# Patient Record
Sex: Male | Born: 1945 | Race: White | Hispanic: No | Marital: Married | Smoking: Former smoker
Health system: Southern US, Community
[De-identification: ages and names within clinical notes are randomized; demographics above are authoritative.]

## PROBLEM LIST (undated history)

## (undated) DIAGNOSIS — E119 Type 2 diabetes mellitus without complications: Secondary | ICD-10-CM

## (undated) DIAGNOSIS — I428 Other cardiomyopathies: Secondary | ICD-10-CM

## (undated) DIAGNOSIS — R51 Headache: Secondary | ICD-10-CM

## (undated) DIAGNOSIS — E785 Hyperlipidemia, unspecified: Secondary | ICD-10-CM

## (undated) DIAGNOSIS — I10 Essential (primary) hypertension: Secondary | ICD-10-CM

## (undated) DIAGNOSIS — C8590 Non-Hodgkin lymphoma, unspecified, unspecified site: Secondary | ICD-10-CM

## (undated) DIAGNOSIS — G4730 Sleep apnea, unspecified: Secondary | ICD-10-CM

## (undated) DIAGNOSIS — K5792 Diverticulitis of intestine, part unspecified, without perforation or abscess without bleeding: Secondary | ICD-10-CM

## (undated) DIAGNOSIS — I5022 Chronic systolic (congestive) heart failure: Secondary | ICD-10-CM

## (undated) HISTORY — DX: Chronic systolic (congestive) heart failure: I50.22

## (undated) HISTORY — DX: Diverticulitis of intestine, part unspecified, without perforation or abscess without bleeding: K57.92

## (undated) HISTORY — PX: HERNIA REPAIR: SHX51

## (undated) HISTORY — DX: Headache: R51

## (undated) HISTORY — DX: Other cardiomyopathies: I42.8

## (undated) HISTORY — DX: Sleep apnea, unspecified: G47.30

## (undated) HISTORY — PX: OTHER SURGICAL HISTORY: SHX169

## (undated) HISTORY — DX: Type 2 diabetes mellitus without complications: E11.9

## (undated) HISTORY — DX: Non-Hodgkin lymphoma, unspecified, unspecified site: C85.90

## (undated) HISTORY — DX: Essential (primary) hypertension: I10

---

## 2001-01-23 ENCOUNTER — Emergency Department (HOSPITAL_COMMUNITY): Admission: EM | Admit: 2001-01-23 | Discharge: 2001-01-23 | Payer: Self-pay | Admitting: *Deleted

## 2003-02-19 ENCOUNTER — Encounter (HOSPITAL_BASED_OUTPATIENT_CLINIC_OR_DEPARTMENT_OTHER): Payer: Self-pay | Admitting: General Surgery

## 2003-02-20 ENCOUNTER — Encounter (INDEPENDENT_AMBULATORY_CARE_PROVIDER_SITE_OTHER): Payer: Self-pay | Admitting: Specialist

## 2003-02-20 ENCOUNTER — Ambulatory Visit (HOSPITAL_COMMUNITY): Admission: RE | Admit: 2003-02-20 | Discharge: 2003-02-20 | Payer: Self-pay | Admitting: General Surgery

## 2003-08-30 ENCOUNTER — Emergency Department (HOSPITAL_COMMUNITY): Admission: EM | Admit: 2003-08-30 | Discharge: 2003-08-30 | Payer: Self-pay | Admitting: Emergency Medicine

## 2003-10-02 ENCOUNTER — Emergency Department (HOSPITAL_COMMUNITY): Admission: EM | Admit: 2003-10-02 | Discharge: 2003-10-02 | Payer: Self-pay | Admitting: Emergency Medicine

## 2003-11-25 ENCOUNTER — Encounter: Admission: RE | Admit: 2003-11-25 | Discharge: 2003-12-17 | Payer: Self-pay | Admitting: Occupational Medicine

## 2003-12-26 ENCOUNTER — Emergency Department (HOSPITAL_COMMUNITY): Admission: AD | Admit: 2003-12-26 | Discharge: 2003-12-26 | Payer: Self-pay | Admitting: Family Medicine

## 2005-02-28 ENCOUNTER — Ambulatory Visit: Payer: Self-pay | Admitting: Family Medicine

## 2005-03-04 ENCOUNTER — Ambulatory Visit: Payer: Self-pay | Admitting: Family Medicine

## 2005-03-08 ENCOUNTER — Ambulatory Visit: Payer: Self-pay | Admitting: Family Medicine

## 2005-11-15 ENCOUNTER — Ambulatory Visit: Payer: Self-pay | Admitting: Family Medicine

## 2005-12-15 ENCOUNTER — Ambulatory Visit: Payer: Self-pay | Admitting: Family Medicine

## 2006-01-09 ENCOUNTER — Ambulatory Visit: Payer: Self-pay | Admitting: Family Medicine

## 2006-03-01 ENCOUNTER — Ambulatory Visit: Payer: Self-pay | Admitting: Family Medicine

## 2006-07-14 ENCOUNTER — Ambulatory Visit: Payer: Self-pay | Admitting: Family Medicine

## 2006-07-24 ENCOUNTER — Ambulatory Visit: Payer: Self-pay | Admitting: Family Medicine

## 2006-11-03 ENCOUNTER — Ambulatory Visit: Payer: Self-pay | Admitting: Family Medicine

## 2007-01-10 ENCOUNTER — Ambulatory Visit: Payer: Self-pay | Admitting: Family Medicine

## 2007-06-07 DIAGNOSIS — R51 Headache: Secondary | ICD-10-CM | POA: Insufficient documentation

## 2007-06-07 DIAGNOSIS — R519 Headache, unspecified: Secondary | ICD-10-CM | POA: Insufficient documentation

## 2007-08-02 ENCOUNTER — Ambulatory Visit: Payer: Self-pay | Admitting: Family Medicine

## 2007-08-08 ENCOUNTER — Telehealth: Payer: Self-pay | Admitting: Family Medicine

## 2007-10-23 ENCOUNTER — Ambulatory Visit: Payer: Self-pay | Admitting: Family Medicine

## 2007-10-23 DIAGNOSIS — N200 Calculus of kidney: Secondary | ICD-10-CM | POA: Insufficient documentation

## 2007-10-23 DIAGNOSIS — I10 Essential (primary) hypertension: Secondary | ICD-10-CM | POA: Insufficient documentation

## 2007-10-23 LAB — CONVERTED CEMR LAB
Glucose, Urine, Semiquant: NEGATIVE
Specific Gravity, Urine: 1.02
WBC Urine, dipstick: NEGATIVE
pH: 5

## 2007-10-25 ENCOUNTER — Ambulatory Visit: Payer: Self-pay | Admitting: Internal Medicine

## 2007-10-26 ENCOUNTER — Ambulatory Visit: Payer: Self-pay | Admitting: Family Medicine

## 2007-10-26 DIAGNOSIS — R1900 Intra-abdominal and pelvic swelling, mass and lump, unspecified site: Secondary | ICD-10-CM | POA: Insufficient documentation

## 2007-10-26 LAB — CONVERTED CEMR LAB
ALT: 28 units/L (ref 0–53)
Albumin: 3.5 g/dL (ref 3.5–5.2)
Alkaline Phosphatase: 106 units/L (ref 39–117)
BUN: 12 mg/dL (ref 6–23)
Basophils Absolute: 0 10*3/uL (ref 0.0–0.1)
Basophils Relative: 0.2 % (ref 0.0–1.0)
CO2: 29 meq/L (ref 19–32)
Calcium: 9.3 mg/dL (ref 8.4–10.5)
GFR calc Af Amer: 110 mL/min
Lymphocytes Relative: 8 % — ABNORMAL LOW (ref 12.0–46.0)
MCHC: 33.6 g/dL (ref 30.0–36.0)
Monocytes Relative: 7.3 % (ref 3.0–11.0)
Neutro Abs: 7.9 10*3/uL — ABNORMAL HIGH (ref 1.4–7.7)
Platelets: 410 10*3/uL — ABNORMAL HIGH (ref 150–400)
RDW: 13.8 % (ref 11.5–14.6)
Total Protein: 7.2 g/dL (ref 6.0–8.3)

## 2007-10-29 ENCOUNTER — Ambulatory Visit: Payer: Self-pay | Admitting: Cardiology

## 2007-10-30 ENCOUNTER — Ambulatory Visit: Payer: Self-pay | Admitting: Family Medicine

## 2007-11-05 ENCOUNTER — Telehealth: Payer: Self-pay | Admitting: Family Medicine

## 2007-11-08 ENCOUNTER — Encounter (INDEPENDENT_AMBULATORY_CARE_PROVIDER_SITE_OTHER): Payer: Self-pay | Admitting: Diagnostic Radiology

## 2007-11-08 ENCOUNTER — Ambulatory Visit (HOSPITAL_COMMUNITY): Admission: RE | Admit: 2007-11-08 | Discharge: 2007-11-08 | Payer: Self-pay | Admitting: Family Medicine

## 2007-11-08 ENCOUNTER — Encounter: Payer: Self-pay | Admitting: Family Medicine

## 2007-11-14 ENCOUNTER — Telehealth: Payer: Self-pay | Admitting: Family Medicine

## 2007-11-20 ENCOUNTER — Ambulatory Visit: Payer: Self-pay | Admitting: Oncology

## 2007-11-20 ENCOUNTER — Telehealth: Payer: Self-pay | Admitting: Family Medicine

## 2007-11-21 ENCOUNTER — Telehealth: Payer: Self-pay | Admitting: Family Medicine

## 2007-11-26 ENCOUNTER — Ambulatory Visit: Payer: Self-pay | Admitting: Family Medicine

## 2007-11-27 ENCOUNTER — Encounter: Payer: Self-pay | Admitting: Family Medicine

## 2007-11-27 LAB — CBC WITH DIFFERENTIAL/PLATELET
BASO%: 0.4 % (ref 0.0–2.0)
EOS%: 1.7 % (ref 0.0–7.0)
HCT: 40 % (ref 38.7–49.9)
LYMPH%: 7.4 % — ABNORMAL LOW (ref 14.0–48.0)
MCH: 23.8 pg — ABNORMAL LOW (ref 28.0–33.4)
MCHC: 31.5 g/dL — ABNORMAL LOW (ref 32.0–35.9)
MONO%: 8.5 % (ref 0.0–13.0)
NEUT%: 82 % — ABNORMAL HIGH (ref 40.0–75.0)
Platelets: 434 10*3/uL — ABNORMAL HIGH (ref 145–400)

## 2007-11-27 LAB — COMPREHENSIVE METABOLIC PANEL
ALT: 15 U/L (ref 0–53)
AST: 15 U/L (ref 0–37)
Alkaline Phosphatase: 103 U/L (ref 39–117)
Creatinine, Ser: 0.94 mg/dL (ref 0.40–1.50)
Sodium: 139 mEq/L (ref 135–145)
Total Bilirubin: 0.6 mg/dL (ref 0.3–1.2)
Total Protein: 7.6 g/dL (ref 6.0–8.3)

## 2007-11-27 LAB — URIC ACID: Uric Acid, Serum: 7.3 mg/dL (ref 4.0–7.8)

## 2007-11-27 LAB — CANCER ANTIGEN 19-9: CA 19-9: 6.4 U/mL (ref <35.0)

## 2007-12-03 ENCOUNTER — Ambulatory Visit (HOSPITAL_COMMUNITY): Admission: RE | Admit: 2007-12-03 | Discharge: 2007-12-03 | Payer: Self-pay | Admitting: Oncology

## 2007-12-04 ENCOUNTER — Encounter: Payer: Self-pay | Admitting: Family Medicine

## 2007-12-04 ENCOUNTER — Ambulatory Visit (HOSPITAL_COMMUNITY): Admission: RE | Admit: 2007-12-04 | Discharge: 2007-12-04 | Payer: Self-pay | Admitting: Oncology

## 2007-12-04 ENCOUNTER — Encounter (INDEPENDENT_AMBULATORY_CARE_PROVIDER_SITE_OTHER): Payer: Self-pay | Admitting: Interventional Radiology

## 2007-12-05 ENCOUNTER — Encounter: Payer: Self-pay | Admitting: Family Medicine

## 2007-12-07 ENCOUNTER — Ambulatory Visit (HOSPITAL_COMMUNITY): Admission: RE | Admit: 2007-12-07 | Discharge: 2007-12-07 | Payer: Self-pay | Admitting: Oncology

## 2007-12-12 ENCOUNTER — Encounter (HOSPITAL_COMMUNITY): Payer: Self-pay | Admitting: Oncology

## 2007-12-12 ENCOUNTER — Other Ambulatory Visit: Admission: RE | Admit: 2007-12-12 | Discharge: 2007-12-12 | Payer: Self-pay | Admitting: Oncology

## 2007-12-12 ENCOUNTER — Encounter: Payer: Self-pay | Admitting: Family Medicine

## 2007-12-12 LAB — COMPREHENSIVE METABOLIC PANEL
ALT: 14 U/L (ref 0–53)
Albumin: 3.7 g/dL (ref 3.5–5.2)
CO2: 25 mEq/L (ref 19–32)
Calcium: 9.1 mg/dL (ref 8.4–10.5)
Chloride: 102 mEq/L (ref 96–112)
Creatinine, Ser: 0.86 mg/dL (ref 0.40–1.50)
Potassium: 4.4 mEq/L (ref 3.5–5.3)
Total Protein: 7.1 g/dL (ref 6.0–8.3)

## 2007-12-12 LAB — LACTATE DEHYDROGENASE: LDH: 343 U/L — ABNORMAL HIGH (ref 94–250)

## 2007-12-12 LAB — CBC WITH DIFFERENTIAL/PLATELET
BASO%: 0 % (ref 0.0–2.0)
Eosinophils Absolute: 0.2 10*3/uL (ref 0.0–0.5)
HCT: 36.4 % — ABNORMAL LOW (ref 38.7–49.9)
HGB: 12 g/dL — ABNORMAL LOW (ref 13.0–17.1)
MCHC: 33 g/dL (ref 32.0–35.9)
MONO#: 0.6 10*3/uL (ref 0.1–0.9)
NEUT#: 5.8 10*3/uL (ref 1.5–6.5)
NEUT%: 79.2 % — ABNORMAL HIGH (ref 40.0–75.0)
Platelets: 486 10*3/uL — ABNORMAL HIGH (ref 145–400)
WBC: 7.3 10*3/uL (ref 4.0–10.0)
lymph#: 0.7 10*3/uL — ABNORMAL LOW (ref 0.9–3.3)

## 2007-12-14 ENCOUNTER — Ambulatory Visit (HOSPITAL_COMMUNITY): Admission: RE | Admit: 2007-12-14 | Discharge: 2007-12-14 | Payer: Self-pay | Admitting: Oncology

## 2007-12-21 LAB — CBC WITH DIFFERENTIAL/PLATELET
BASO%: 0.3 % (ref 0.0–2.0)
Eosinophils Absolute: 0.2 10*3/uL (ref 0.0–0.5)
LYMPH%: 14.3 % (ref 14.0–48.0)
MCHC: 33.5 g/dL (ref 32.0–35.9)
MCV: 73.7 fL — ABNORMAL LOW (ref 81.6–98.0)
MONO%: 2.6 % (ref 0.0–13.0)
Platelets: 343 10*3/uL (ref 145–400)
RBC: 4.98 10*6/uL (ref 4.20–5.71)

## 2007-12-28 ENCOUNTER — Encounter: Payer: Self-pay | Admitting: Family Medicine

## 2007-12-28 LAB — COMPREHENSIVE METABOLIC PANEL
ALT: 43 U/L (ref 0–53)
AST: 25 U/L (ref 0–37)
Albumin: 4 g/dL (ref 3.5–5.2)
Alkaline Phosphatase: 116 U/L (ref 39–117)
BUN: 10 mg/dL (ref 6–23)
Potassium: 4.3 mEq/L (ref 3.5–5.3)
Sodium: 138 mEq/L (ref 135–145)

## 2007-12-28 LAB — CBC WITH DIFFERENTIAL/PLATELET
BASO%: 1.5 % (ref 0.0–2.0)
EOS%: 2.5 % (ref 0.0–7.0)
LYMPH%: 14.4 % (ref 14.0–48.0)
MCH: 24.8 pg — ABNORMAL LOW (ref 28.0–33.4)
MCHC: 33 g/dL (ref 32.0–35.9)
MONO#: 0.5 10*3/uL (ref 0.1–0.9)
Platelets: 180 10*3/uL (ref 145–400)
RBC: 5.19 10*6/uL (ref 4.20–5.71)
WBC: 5.7 10*3/uL (ref 4.0–10.0)

## 2008-01-04 LAB — CBC WITH DIFFERENTIAL/PLATELET
BASO%: 1.2 % (ref 0.0–2.0)
Eosinophils Absolute: 0 10*3/uL (ref 0.0–0.5)
HCT: 36.7 % — ABNORMAL LOW (ref 38.7–49.9)
LYMPH%: 16.2 % (ref 14.0–48.0)
MCHC: 34.4 g/dL (ref 32.0–35.9)
MONO#: 0.2 10*3/uL (ref 0.1–0.9)
NEUT%: 76.2 % — ABNORMAL HIGH (ref 40.0–75.0)
Platelets: 226 10*3/uL (ref 145–400)
WBC: 3.5 10*3/uL — ABNORMAL LOW (ref 4.0–10.0)

## 2008-01-09 ENCOUNTER — Ambulatory Visit: Payer: Self-pay | Admitting: Oncology

## 2008-01-10 ENCOUNTER — Encounter: Payer: Self-pay | Admitting: Family Medicine

## 2008-01-18 LAB — CBC WITH DIFFERENTIAL/PLATELET
BASO%: 0.2 % (ref 0.0–2.0)
Basophils Absolute: 0 10*3/uL (ref 0.0–0.1)
EOS%: 0.3 % (ref 0.0–7.0)
HGB: 11.6 g/dL — ABNORMAL LOW (ref 13.0–17.1)
MCH: 25.1 pg — ABNORMAL LOW (ref 28.0–33.4)
MONO%: 2.8 % (ref 0.0–13.0)
RBC: 4.61 10*6/uL (ref 4.20–5.71)
RDW: 18.5 % — ABNORMAL HIGH (ref 11.2–14.6)
lymph#: 0.3 10*3/uL — ABNORMAL LOW (ref 0.9–3.3)

## 2008-01-23 ENCOUNTER — Encounter (HOSPITAL_COMMUNITY): Payer: Self-pay | Admitting: Oncology

## 2008-01-23 ENCOUNTER — Ambulatory Visit: Admission: RE | Admit: 2008-01-23 | Discharge: 2008-01-23 | Payer: Self-pay | Admitting: Oncology

## 2008-01-23 ENCOUNTER — Ambulatory Visit: Payer: Self-pay | Admitting: Internal Medicine

## 2008-01-24 ENCOUNTER — Encounter: Payer: Self-pay | Admitting: Family Medicine

## 2008-01-24 LAB — CBC WITH DIFFERENTIAL/PLATELET
Basophils Absolute: 0 10*3/uL (ref 0.0–0.1)
Eosinophils Absolute: 0 10*3/uL (ref 0.0–0.5)
HGB: 11.4 g/dL — ABNORMAL LOW (ref 13.0–17.1)
MONO#: 0.8 10*3/uL (ref 0.1–0.9)
NEUT#: 6.1 10*3/uL (ref 1.5–6.5)
RBC: 4.53 10*6/uL (ref 4.20–5.71)
RDW: 20.2 % — ABNORMAL HIGH (ref 11.2–14.6)
WBC: 7.3 10*3/uL (ref 4.0–10.0)
lymph#: 0.4 10*3/uL — ABNORMAL LOW (ref 0.9–3.3)

## 2008-01-24 LAB — URIC ACID: Uric Acid, Serum: 4.5 mg/dL (ref 4.0–7.8)

## 2008-01-24 LAB — COMPREHENSIVE METABOLIC PANEL
Albumin: 3.6 g/dL (ref 3.5–5.2)
Alkaline Phosphatase: 97 U/L (ref 39–117)
BUN: 13 mg/dL (ref 6–23)
Calcium: 8.3 mg/dL — ABNORMAL LOW (ref 8.4–10.5)
Chloride: 101 mEq/L (ref 96–112)
Glucose, Bld: 112 mg/dL — ABNORMAL HIGH (ref 70–99)
Potassium: 4.3 mEq/L (ref 3.5–5.3)
Sodium: 137 mEq/L (ref 135–145)
Total Protein: 5.5 g/dL — ABNORMAL LOW (ref 6.0–8.3)

## 2008-01-24 LAB — LACTATE DEHYDROGENASE: LDH: 191 U/L (ref 94–250)

## 2008-02-01 LAB — CBC WITH DIFFERENTIAL/PLATELET
BASO%: 0.3 % (ref 0.0–2.0)
Basophils Absolute: 0 10*3/uL (ref 0.0–0.1)
HCT: 31.8 % — ABNORMAL LOW (ref 38.7–49.9)
HGB: 10.9 g/dL — ABNORMAL LOW (ref 13.0–17.1)
LYMPH%: 13.5 % — ABNORMAL LOW (ref 14.0–48.0)
MCH: 25.4 pg — ABNORMAL LOW (ref 28.0–33.4)
MCHC: 34.4 g/dL (ref 32.0–35.9)
MONO#: 0.2 10*3/uL (ref 0.1–0.9)
NEUT%: 73.7 % (ref 40.0–75.0)
Platelets: 235 10*3/uL (ref 145–400)
WBC: 1.8 10*3/uL — ABNORMAL LOW (ref 4.0–10.0)
lymph#: 0.2 10*3/uL — ABNORMAL LOW (ref 0.9–3.3)

## 2008-02-05 ENCOUNTER — Inpatient Hospital Stay (HOSPITAL_COMMUNITY): Admission: RE | Admit: 2008-02-05 | Discharge: 2008-02-07 | Payer: Self-pay | Admitting: Oncology

## 2008-02-05 ENCOUNTER — Ambulatory Visit: Payer: Self-pay | Admitting: Oncology

## 2008-02-05 LAB — CBC WITH DIFFERENTIAL/PLATELET
BASO%: 0.2 % (ref 0.0–2.0)
Basophils Absolute: 0 10*3/uL (ref 0.0–0.1)
EOS%: 0 % (ref 0.0–7.0)
HCT: 32 % — ABNORMAL LOW (ref 38.7–49.9)
HGB: 11 g/dL — ABNORMAL LOW (ref 13.0–17.1)
LYMPH%: 4 % — ABNORMAL LOW (ref 14.0–48.0)
MCH: 25.5 pg — ABNORMAL LOW (ref 28.0–33.4)
MCHC: 34.3 g/dL (ref 32.0–35.9)
MCV: 74.5 fL — ABNORMAL LOW (ref 81.6–98.0)
MONO%: 12 % (ref 0.0–13.0)
NEUT%: 83.8 % — ABNORMAL HIGH (ref 40.0–75.0)

## 2008-02-13 ENCOUNTER — Encounter: Payer: Self-pay | Admitting: Family Medicine

## 2008-02-13 LAB — COMPREHENSIVE METABOLIC PANEL
AST: 16 U/L (ref 0–37)
Albumin: 3.5 g/dL (ref 3.5–5.2)
Alkaline Phosphatase: 82 U/L (ref 39–117)
Potassium: 3.9 mEq/L (ref 3.5–5.3)
Sodium: 137 mEq/L (ref 135–145)
Total Protein: 5.8 g/dL — ABNORMAL LOW (ref 6.0–8.3)

## 2008-02-13 LAB — CBC WITH DIFFERENTIAL/PLATELET
BASO%: 0.4 % (ref 0.0–2.0)
EOS%: 2.8 % (ref 0.0–7.0)
MCH: 25.3 pg — ABNORMAL LOW (ref 28.0–33.4)
MCV: 74.7 fL — ABNORMAL LOW (ref 81.6–98.0)
MONO%: 10.1 % (ref 0.0–13.0)
NEUT#: 4.2 10*3/uL (ref 1.5–6.5)
RBC: 4.22 10*6/uL (ref 4.20–5.71)
RDW: 20.8 % — ABNORMAL HIGH (ref 11.2–14.6)

## 2008-02-20 ENCOUNTER — Encounter: Payer: Self-pay | Admitting: Family Medicine

## 2008-02-20 LAB — CBC WITH DIFFERENTIAL/PLATELET
Basophils Absolute: 0 10*3/uL (ref 0.0–0.1)
EOS%: 8.2 % — ABNORMAL HIGH (ref 0.0–7.0)
HCT: 35.3 % — ABNORMAL LOW (ref 38.7–49.9)
HGB: 11.8 g/dL — ABNORMAL LOW (ref 13.0–17.1)
MCH: 25.4 pg — ABNORMAL LOW (ref 28.0–33.4)
NEUT%: 59.7 % (ref 40.0–75.0)
lymph#: 1.5 10*3/uL (ref 0.9–3.3)

## 2008-02-20 LAB — COMPREHENSIVE METABOLIC PANEL
Albumin: 4 g/dL (ref 3.5–5.2)
Alkaline Phosphatase: 101 U/L (ref 39–117)
BUN: 11 mg/dL (ref 6–23)
CO2: 23 mEq/L (ref 19–32)
Calcium: 9.3 mg/dL (ref 8.4–10.5)
Chloride: 105 mEq/L (ref 96–112)
Glucose, Bld: 105 mg/dL — ABNORMAL HIGH (ref 70–99)
Potassium: 4.4 mEq/L (ref 3.5–5.3)

## 2008-02-20 LAB — LACTATE DEHYDROGENASE: LDH: 239 U/L (ref 94–250)

## 2008-02-26 ENCOUNTER — Ambulatory Visit: Payer: Self-pay | Admitting: Oncology

## 2008-03-13 ENCOUNTER — Encounter: Payer: Self-pay | Admitting: Family Medicine

## 2008-03-13 LAB — CBC WITH DIFFERENTIAL/PLATELET
BASO%: 0.8 % (ref 0.0–2.0)
Eosinophils Absolute: 0.4 10*3/uL (ref 0.0–0.5)
MONO#: 0.7 10*3/uL (ref 0.1–0.9)
NEUT#: 3.9 10*3/uL (ref 1.5–6.5)
RBC: 4.54 10*6/uL (ref 4.20–5.71)
RDW: 18.8 % — ABNORMAL HIGH (ref 11.2–14.6)
WBC: 5.8 10*3/uL (ref 4.0–10.0)
lymph#: 0.7 10*3/uL — ABNORMAL LOW (ref 0.9–3.3)

## 2008-03-13 LAB — COMPREHENSIVE METABOLIC PANEL
ALT: 27 U/L (ref 0–53)
Albumin: 3.7 g/dL (ref 3.5–5.2)
BUN: 13 mg/dL (ref 6–23)
CO2: 23 mEq/L (ref 19–32)
Calcium: 9 mg/dL (ref 8.4–10.5)
Chloride: 104 mEq/L (ref 96–112)
Creatinine, Ser: 0.81 mg/dL (ref 0.40–1.50)
Potassium: 3.6 mEq/L (ref 3.5–5.3)

## 2008-03-13 LAB — LACTATE DEHYDROGENASE: LDH: 207 U/L (ref 94–250)

## 2008-03-13 LAB — ERYTHROCYTE SEDIMENTATION RATE: Sed Rate: 47 mm/hr — ABNORMAL HIGH (ref 0–20)

## 2008-03-20 LAB — CBC WITH DIFFERENTIAL/PLATELET
Basophils Absolute: 0 10*3/uL (ref 0.0–0.1)
Eosinophils Absolute: 0.1 10*3/uL (ref 0.0–0.5)
HCT: 33.5 % — ABNORMAL LOW (ref 38.7–49.9)
HGB: 11.1 g/dL — ABNORMAL LOW (ref 13.0–17.1)
LYMPH%: 7.6 % — ABNORMAL LOW (ref 14.0–48.0)
MCV: 76 fL — ABNORMAL LOW (ref 81.6–98.0)
MONO#: 0 10*3/uL — ABNORMAL LOW (ref 0.1–0.9)
MONO%: 0.5 % (ref 0.0–13.0)
NEUT#: 6 10*3/uL (ref 1.5–6.5)
NEUT%: 89.6 % — ABNORMAL HIGH (ref 40.0–75.0)
Platelets: 365 10*3/uL (ref 145–400)
WBC: 6.7 10*3/uL (ref 4.0–10.0)

## 2008-03-27 LAB — CBC WITH DIFFERENTIAL/PLATELET
BASO%: 1 % (ref 0.0–2.0)
HCT: 37.4 % — ABNORMAL LOW (ref 38.7–49.9)
LYMPH%: 12.9 % — ABNORMAL LOW (ref 14.0–48.0)
MCHC: 32.7 g/dL (ref 32.0–35.9)
MCV: 78.6 fL — ABNORMAL LOW (ref 81.6–98.0)
MONO#: 0.6 10*3/uL (ref 0.1–0.9)
MONO%: 9 % (ref 0.0–13.0)
NEUT%: 76 % — ABNORMAL HIGH (ref 40.0–75.0)
Platelets: 160 10*3/uL (ref 145–400)
WBC: 6.5 10*3/uL (ref 4.0–10.0)

## 2008-03-31 ENCOUNTER — Ambulatory Visit (HOSPITAL_COMMUNITY): Admission: RE | Admit: 2008-03-31 | Discharge: 2008-03-31 | Payer: Self-pay | Admitting: Oncology

## 2008-04-04 ENCOUNTER — Encounter: Payer: Self-pay | Admitting: Family Medicine

## 2008-04-04 LAB — COMPREHENSIVE METABOLIC PANEL
AST: 22 U/L (ref 0–37)
Alkaline Phosphatase: 94 U/L (ref 39–117)
BUN: 12 mg/dL (ref 6–23)
Creatinine, Ser: 0.84 mg/dL (ref 0.40–1.50)

## 2008-04-04 LAB — CBC WITH DIFFERENTIAL/PLATELET
BASO%: 1.3 % (ref 0.0–2.0)
EOS%: 0.8 % (ref 0.0–7.0)
HCT: 35.4 % — ABNORMAL LOW (ref 38.7–49.9)
LYMPH%: 15.9 % (ref 14.0–48.0)
MCH: 25.8 pg — ABNORMAL LOW (ref 28.0–33.4)
MCHC: 33.3 g/dL (ref 32.0–35.9)
NEUT%: 67.5 % (ref 40.0–75.0)
Platelets: 317 10*3/uL (ref 145–400)
RBC: 4.57 10*6/uL (ref 4.20–5.71)
lymph#: 0.8 10*3/uL — ABNORMAL LOW (ref 0.9–3.3)

## 2008-04-04 LAB — ERYTHROCYTE SEDIMENTATION RATE: Sed Rate: 30 mm/hr — ABNORMAL HIGH (ref 0–20)

## 2008-04-08 ENCOUNTER — Ambulatory Visit: Payer: Self-pay | Admitting: Oncology

## 2008-04-28 ENCOUNTER — Encounter: Payer: Self-pay | Admitting: Family Medicine

## 2008-06-01 ENCOUNTER — Ambulatory Visit: Payer: Self-pay | Admitting: Oncology

## 2008-06-02 ENCOUNTER — Ambulatory Visit (HOSPITAL_COMMUNITY): Admission: RE | Admit: 2008-06-02 | Discharge: 2008-06-02 | Payer: Self-pay | Admitting: Oncology

## 2008-06-06 ENCOUNTER — Encounter: Payer: Self-pay | Admitting: Family Medicine

## 2008-06-06 LAB — CBC WITH DIFFERENTIAL/PLATELET
EOS%: 5.9 % (ref 0.0–7.0)
Eosinophils Absolute: 0.3 10*3/uL (ref 0.0–0.5)
HGB: 13.6 g/dL (ref 13.0–17.1)
MCH: 26.4 pg — ABNORMAL LOW (ref 28.0–33.4)
MCV: 77.7 fL — ABNORMAL LOW (ref 81.6–98.0)
MONO%: 10.1 % (ref 0.0–13.0)
NEUT#: 3.6 10*3/uL (ref 1.5–6.5)
RBC: 5.16 10*6/uL (ref 4.20–5.71)
RDW: 15.9 % — ABNORMAL HIGH (ref 11.2–14.6)
lymph#: 1 10*3/uL (ref 0.9–3.3)

## 2008-06-06 LAB — COMPREHENSIVE METABOLIC PANEL
AST: 22 U/L (ref 0–37)
Albumin: 4.1 g/dL (ref 3.5–5.2)
Alkaline Phosphatase: 106 U/L (ref 39–117)
Calcium: 9.1 mg/dL (ref 8.4–10.5)
Chloride: 104 mEq/L (ref 96–112)
Potassium: 3.8 mEq/L (ref 3.5–5.3)
Sodium: 139 mEq/L (ref 135–145)
Total Protein: 6.3 g/dL (ref 6.0–8.3)

## 2008-07-17 ENCOUNTER — Encounter: Payer: Self-pay | Admitting: Family Medicine

## 2008-07-17 ENCOUNTER — Ambulatory Visit: Payer: Self-pay | Admitting: Oncology

## 2008-07-17 LAB — COMPREHENSIVE METABOLIC PANEL
AST: 21 U/L (ref 0–37)
Albumin: 4.1 g/dL (ref 3.5–5.2)
BUN: 13 mg/dL (ref 6–23)
CO2: 22 mEq/L (ref 19–32)
Calcium: 8.9 mg/dL (ref 8.4–10.5)
Chloride: 104 mEq/L (ref 96–112)
Creatinine, Ser: 0.73 mg/dL (ref 0.40–1.50)
Potassium: 3.7 mEq/L (ref 3.5–5.3)

## 2008-07-17 LAB — CBC WITH DIFFERENTIAL/PLATELET
Basophils Absolute: 0 10*3/uL (ref 0.0–0.1)
EOS%: 2.9 % (ref 0.0–7.0)
Eosinophils Absolute: 0.2 10*3/uL (ref 0.0–0.5)
HGB: 13 g/dL (ref 13.0–17.1)
NEUT#: 4.1 10*3/uL (ref 1.5–6.5)
RBC: 5.21 10*6/uL (ref 4.20–5.71)
RDW: 13.4 % (ref 11.2–14.6)
lymph#: 1.1 10*3/uL (ref 0.9–3.3)

## 2008-07-17 LAB — LACTATE DEHYDROGENASE: LDH: 157 U/L (ref 94–250)

## 2008-07-17 LAB — ERYTHROCYTE SEDIMENTATION RATE: Sed Rate: 10 mm/hr (ref 0–20)

## 2008-09-12 ENCOUNTER — Ambulatory Visit: Payer: Self-pay | Admitting: Oncology

## 2008-09-16 ENCOUNTER — Encounter: Payer: Self-pay | Admitting: Family Medicine

## 2008-09-16 LAB — COMPREHENSIVE METABOLIC PANEL
Albumin: 4.3 g/dL (ref 3.5–5.2)
BUN: 17 mg/dL (ref 6–23)
CO2: 22 mEq/L (ref 19–32)
Calcium: 9 mg/dL (ref 8.4–10.5)
Chloride: 105 mEq/L (ref 96–112)
Creatinine, Ser: 0.72 mg/dL (ref 0.40–1.50)
Glucose, Bld: 133 mg/dL — ABNORMAL HIGH (ref 70–99)
Potassium: 3.5 mEq/L (ref 3.5–5.3)

## 2008-09-16 LAB — CBC WITH DIFFERENTIAL/PLATELET
BASO%: 0.4 % (ref 0.0–2.0)
Basophils Absolute: 0 10*3/uL (ref 0.0–0.1)
Eosinophils Absolute: 0.1 10*3/uL (ref 0.0–0.5)
HCT: 39.4 % (ref 38.7–49.9)
HGB: 13.1 g/dL (ref 13.0–17.1)
LYMPH%: 15.9 % (ref 14.0–48.0)
MCHC: 33.2 g/dL (ref 32.0–35.9)
MONO#: 0.4 10*3/uL (ref 0.1–0.9)
NEUT%: 74.4 % (ref 40.0–75.0)
Platelets: 278 10*3/uL (ref 145–400)
WBC: 5.6 10*3/uL (ref 4.0–10.0)

## 2008-09-16 LAB — LACTATE DEHYDROGENASE: LDH: 158 U/L (ref 94–250)

## 2008-09-24 ENCOUNTER — Ambulatory Visit: Payer: Self-pay | Admitting: Family Medicine

## 2008-09-24 DIAGNOSIS — R42 Dizziness and giddiness: Secondary | ICD-10-CM | POA: Insufficient documentation

## 2008-09-24 DIAGNOSIS — C8590 Non-Hodgkin lymphoma, unspecified, unspecified site: Secondary | ICD-10-CM | POA: Insufficient documentation

## 2008-09-24 DIAGNOSIS — C8589 Other specified types of non-Hodgkin lymphoma, extranodal and solid organ sites: Secondary | ICD-10-CM

## 2008-09-26 ENCOUNTER — Encounter: Admission: RE | Admit: 2008-09-26 | Discharge: 2008-09-26 | Payer: Self-pay | Admitting: Family Medicine

## 2008-10-18 ENCOUNTER — Encounter: Admission: RE | Admit: 2008-10-18 | Discharge: 2008-10-18 | Payer: Self-pay | Admitting: Family Medicine

## 2008-10-21 ENCOUNTER — Encounter: Payer: Self-pay | Admitting: Family Medicine

## 2008-11-07 ENCOUNTER — Encounter: Payer: Self-pay | Admitting: Family Medicine

## 2008-11-13 ENCOUNTER — Ambulatory Visit: Payer: Self-pay | Admitting: Oncology

## 2008-11-13 ENCOUNTER — Ambulatory Visit (HOSPITAL_COMMUNITY): Admission: RE | Admit: 2008-11-13 | Discharge: 2008-11-13 | Payer: Self-pay | Admitting: Oncology

## 2008-11-19 ENCOUNTER — Inpatient Hospital Stay (HOSPITAL_COMMUNITY): Admission: EM | Admit: 2008-11-19 | Discharge: 2008-11-22 | Payer: Self-pay | Admitting: Emergency Medicine

## 2008-11-20 ENCOUNTER — Ambulatory Visit: Payer: Self-pay | Admitting: Oncology

## 2008-11-20 ENCOUNTER — Encounter (INDEPENDENT_AMBULATORY_CARE_PROVIDER_SITE_OTHER): Payer: Self-pay | Admitting: Surgery

## 2008-12-05 ENCOUNTER — Encounter: Payer: Self-pay | Admitting: Family Medicine

## 2009-03-24 ENCOUNTER — Ambulatory Visit: Payer: Self-pay | Admitting: Oncology

## 2009-03-26 ENCOUNTER — Encounter: Payer: Self-pay | Admitting: Family Medicine

## 2009-03-26 LAB — CBC WITH DIFFERENTIAL/PLATELET
BASO%: 0.3 % (ref 0.0–2.0)
Basophils Absolute: 0 10*3/uL (ref 0.0–0.1)
EOS%: 3.2 % (ref 0.0–7.0)
Eosinophils Absolute: 0.2 10*3/uL (ref 0.0–0.5)
HCT: 44 % (ref 38.4–49.9)
HGB: 15.3 g/dL (ref 13.0–17.1)
LYMPH%: 20.1 % (ref 14.0–49.0)
MCH: 28 pg (ref 27.2–33.4)
MCHC: 34.8 g/dL (ref 32.0–36.0)
MCV: 80.5 fL (ref 79.3–98.0)
MONO#: 0.4 10*3/uL (ref 0.1–0.9)
MONO%: 6.9 % (ref 0.0–14.0)
NEUT#: 4.3 10*3/uL (ref 1.5–6.5)
NEUT%: 69.5 % (ref 39.0–75.0)
Platelets: 254 10*3/uL (ref 140–400)
RBC: 5.46 10*6/uL (ref 4.20–5.82)
RDW: 16.1 % — ABNORMAL HIGH (ref 11.0–14.6)
WBC: 6.2 10*3/uL (ref 4.0–10.3)
lymph#: 1.2 10*3/uL (ref 0.9–3.3)

## 2009-03-26 LAB — COMPREHENSIVE METABOLIC PANEL
ALT: 39 U/L (ref 0–53)
AST: 22 U/L (ref 0–37)
Albumin: 4.2 g/dL (ref 3.5–5.2)
Alkaline Phosphatase: 92 U/L (ref 39–117)
BUN: 14 mg/dL (ref 6–23)
CO2: 23 mEq/L (ref 19–32)
Calcium: 9.1 mg/dL (ref 8.4–10.5)
Chloride: 106 mEq/L (ref 96–112)
Creatinine, Ser: 0.87 mg/dL (ref 0.40–1.50)
Glucose, Bld: 115 mg/dL — ABNORMAL HIGH (ref 70–99)
Potassium: 3.8 mEq/L (ref 3.5–5.3)
Sodium: 140 mEq/L (ref 135–145)
Total Bilirubin: 0.4 mg/dL (ref 0.3–1.2)
Total Protein: 6.8 g/dL (ref 6.0–8.3)

## 2009-03-26 LAB — LACTATE DEHYDROGENASE: LDH: 177 U/L (ref 94–250)

## 2009-05-28 ENCOUNTER — Ambulatory Visit: Payer: Self-pay | Admitting: Oncology

## 2009-08-18 ENCOUNTER — Ambulatory Visit: Payer: Self-pay | Admitting: Oncology

## 2009-08-20 ENCOUNTER — Encounter (INDEPENDENT_AMBULATORY_CARE_PROVIDER_SITE_OTHER): Payer: Self-pay | Admitting: *Deleted

## 2009-08-20 LAB — CBC WITH DIFFERENTIAL/PLATELET
BASO%: 0.4 % (ref 0.0–2.0)
Basophils Absolute: 0 10*3/uL (ref 0.0–0.1)
EOS%: 4.2 % (ref 0.0–7.0)
HGB: 15.2 g/dL (ref 13.0–17.1)
MCH: 29 pg (ref 27.2–33.4)
MCHC: 34.3 g/dL (ref 32.0–36.0)
MCV: 84.6 fL (ref 79.3–98.0)
MONO%: 6.7 % (ref 0.0–14.0)
RDW: 14.1 % (ref 11.0–14.6)

## 2009-08-20 LAB — COMPREHENSIVE METABOLIC PANEL
AST: 34 U/L (ref 0–37)
Albumin: 4.3 g/dL (ref 3.5–5.2)
Alkaline Phosphatase: 95 U/L (ref 39–117)
BUN: 15 mg/dL (ref 6–23)
Creatinine, Ser: 0.85 mg/dL (ref 0.40–1.50)
Potassium: 3.7 mEq/L (ref 3.5–5.3)

## 2009-08-20 LAB — LACTATE DEHYDROGENASE: LDH: 187 U/L (ref 94–250)

## 2009-08-21 ENCOUNTER — Encounter (HOSPITAL_COMMUNITY): Payer: Self-pay | Admitting: Oncology

## 2009-08-21 ENCOUNTER — Ambulatory Visit: Payer: Self-pay | Admitting: Vascular Surgery

## 2009-08-21 ENCOUNTER — Ambulatory Visit: Admission: RE | Admit: 2009-08-21 | Discharge: 2009-08-21 | Payer: Self-pay | Admitting: Oncology

## 2009-08-24 ENCOUNTER — Ambulatory Visit: Payer: Self-pay | Admitting: Family Medicine

## 2009-09-14 ENCOUNTER — Ambulatory Visit: Payer: Self-pay | Admitting: Family Medicine

## 2009-09-14 DIAGNOSIS — R609 Edema, unspecified: Secondary | ICD-10-CM | POA: Insufficient documentation

## 2009-09-30 ENCOUNTER — Ambulatory Visit: Payer: Self-pay | Admitting: Oncology

## 2009-09-30 ENCOUNTER — Ambulatory Visit (HOSPITAL_COMMUNITY): Admission: RE | Admit: 2009-09-30 | Discharge: 2009-09-30 | Payer: Self-pay | Admitting: Oncology

## 2009-10-02 ENCOUNTER — Encounter: Payer: Self-pay | Admitting: Family Medicine

## 2009-10-02 ENCOUNTER — Encounter (INDEPENDENT_AMBULATORY_CARE_PROVIDER_SITE_OTHER): Payer: Self-pay | Admitting: *Deleted

## 2009-10-02 LAB — CBC WITH DIFFERENTIAL/PLATELET
BASO%: 0.3 % (ref 0.0–2.0)
EOS%: 4 % (ref 0.0–7.0)
MCH: 29.1 pg (ref 27.2–33.4)
MCHC: 34 g/dL (ref 32.0–36.0)
MONO#: 0.5 10*3/uL (ref 0.1–0.9)
RDW: 14.9 % — ABNORMAL HIGH (ref 11.0–14.6)
WBC: 7.1 10*3/uL (ref 4.0–10.3)
lymph#: 1.7 10*3/uL (ref 0.9–3.3)

## 2009-10-02 LAB — COMPREHENSIVE METABOLIC PANEL
ALT: 98 U/L — ABNORMAL HIGH (ref 0–53)
AST: 51 U/L — ABNORMAL HIGH (ref 0–37)
Albumin: 4.3 g/dL (ref 3.5–5.2)
CO2: 27 mEq/L (ref 19–32)
Calcium: 9.4 mg/dL (ref 8.4–10.5)
Chloride: 103 mEq/L (ref 96–112)
Potassium: 3.9 mEq/L (ref 3.5–5.3)
Sodium: 141 mEq/L (ref 135–145)
Total Protein: 6.8 g/dL (ref 6.0–8.3)

## 2009-10-05 ENCOUNTER — Ambulatory Visit: Payer: Self-pay | Admitting: Family Medicine

## 2009-10-15 ENCOUNTER — Ambulatory Visit (HOSPITAL_COMMUNITY): Admission: RE | Admit: 2009-10-15 | Discharge: 2009-10-15 | Payer: Self-pay | Admitting: Oncology

## 2009-11-02 ENCOUNTER — Ambulatory Visit: Payer: Self-pay | Admitting: Family Medicine

## 2009-11-12 ENCOUNTER — Ambulatory Visit: Payer: Self-pay | Admitting: Oncology

## 2009-11-12 ENCOUNTER — Ambulatory Visit: Payer: Self-pay | Admitting: Family Medicine

## 2009-11-23 ENCOUNTER — Encounter: Payer: Self-pay | Admitting: Family Medicine

## 2009-11-23 LAB — LACTATE DEHYDROGENASE: LDH: 189 U/L (ref 94–250)

## 2009-11-23 LAB — CBC WITH DIFFERENTIAL/PLATELET
EOS%: 3.2 % (ref 0.0–7.0)
Eosinophils Absolute: 0.2 10*3/uL (ref 0.0–0.5)
LYMPH%: 19.7 % (ref 14.0–49.0)
MCH: 29.2 pg (ref 27.2–33.4)
MCV: 86.2 fL (ref 79.3–98.0)
MONO%: 5.8 % (ref 0.0–14.0)
NEUT#: 5.1 10*3/uL (ref 1.5–6.5)
Platelets: 280 10*3/uL (ref 140–400)
RBC: 5.36 10*6/uL (ref 4.20–5.82)

## 2009-11-23 LAB — COMPREHENSIVE METABOLIC PANEL
CO2: 23 mEq/L (ref 19–32)
Creatinine, Ser: 0.89 mg/dL (ref 0.40–1.50)
Glucose, Bld: 135 mg/dL — ABNORMAL HIGH (ref 70–99)
Total Bilirubin: 0.4 mg/dL (ref 0.3–1.2)

## 2009-12-29 ENCOUNTER — Ambulatory Visit (HOSPITAL_COMMUNITY): Admission: RE | Admit: 2009-12-29 | Discharge: 2009-12-29 | Payer: Self-pay | Admitting: Oncology

## 2010-01-01 ENCOUNTER — Ambulatory Visit: Payer: Self-pay | Admitting: Oncology

## 2010-01-05 ENCOUNTER — Encounter: Payer: Self-pay | Admitting: Family Medicine

## 2010-01-05 LAB — CBC WITH DIFFERENTIAL/PLATELET
BASO%: 0.5 % (ref 0.0–2.0)
EOS%: 3.9 % (ref 0.0–7.0)
MCH: 29.9 pg (ref 27.2–33.4)
MCHC: 34.3 g/dL (ref 32.0–36.0)
MCV: 87.3 fL (ref 79.3–98.0)
MONO%: 7 % (ref 0.0–14.0)
RBC: 5.3 10*6/uL (ref 4.20–5.82)
RDW: 13.9 % (ref 11.0–14.6)
lymph#: 1.5 10*3/uL (ref 0.9–3.3)

## 2010-01-05 LAB — COMPREHENSIVE METABOLIC PANEL
ALT: 71 U/L — ABNORMAL HIGH (ref 0–53)
AST: 39 U/L — ABNORMAL HIGH (ref 0–37)
Albumin: 4.3 g/dL (ref 3.5–5.2)
Alkaline Phosphatase: 101 U/L (ref 39–117)
BUN: 16 mg/dL (ref 6–23)
Chloride: 101 mEq/L (ref 96–112)
Potassium: 4.1 mEq/L (ref 3.5–5.3)
Sodium: 140 mEq/L (ref 135–145)
Total Protein: 6.6 g/dL (ref 6.0–8.3)

## 2010-03-31 ENCOUNTER — Ambulatory Visit: Payer: Self-pay | Admitting: Oncology

## 2010-04-02 LAB — CBC WITH DIFFERENTIAL/PLATELET
BASO%: 0.1 % (ref 0.0–2.0)
Basophils Absolute: 0 10*3/uL (ref 0.0–0.1)
EOS%: 3.9 % (ref 0.0–7.0)
HCT: 45.4 % (ref 38.4–49.9)
HGB: 15.2 g/dL (ref 13.0–17.1)
MCH: 28.7 pg (ref 27.2–33.4)
MCHC: 33.5 g/dL (ref 32.0–36.0)
MONO#: 0.5 10*3/uL (ref 0.1–0.9)
NEUT%: 66.9 % (ref 39.0–75.0)
RDW: 13.6 % (ref 11.0–14.6)
WBC: 7.2 10*3/uL (ref 4.0–10.3)
lymph#: 1.6 10*3/uL (ref 0.9–3.3)

## 2010-04-02 LAB — COMPREHENSIVE METABOLIC PANEL
AST: 35 U/L (ref 0–37)
Albumin: 4.3 g/dL (ref 3.5–5.2)
Alkaline Phosphatase: 96 U/L (ref 39–117)
BUN: 16 mg/dL (ref 6–23)
Calcium: 9 mg/dL (ref 8.4–10.5)
Chloride: 102 mEq/L (ref 96–112)
Potassium: 3.7 mEq/L (ref 3.5–5.3)
Sodium: 139 mEq/L (ref 135–145)
Total Protein: 6.8 g/dL (ref 6.0–8.3)

## 2010-06-30 ENCOUNTER — Ambulatory Visit: Payer: Self-pay | Admitting: Oncology

## 2010-07-01 ENCOUNTER — Ambulatory Visit (HOSPITAL_COMMUNITY): Admission: RE | Admit: 2010-07-01 | Discharge: 2010-07-01 | Payer: Self-pay | Admitting: Oncology

## 2010-07-01 LAB — COMPREHENSIVE METABOLIC PANEL
BUN: 14 mg/dL (ref 6–23)
CO2: 29 mEq/L (ref 19–32)
Calcium: 9.2 mg/dL (ref 8.4–10.5)
Chloride: 102 mEq/L (ref 96–112)
Creatinine, Ser: 0.87 mg/dL (ref 0.40–1.50)
Glucose, Bld: 102 mg/dL — ABNORMAL HIGH (ref 70–99)
Total Bilirubin: 0.5 mg/dL (ref 0.3–1.2)

## 2010-07-01 LAB — CBC WITH DIFFERENTIAL/PLATELET
Basophils Absolute: 0 10*3/uL (ref 0.0–0.1)
Eosinophils Absolute: 0.1 10*3/uL (ref 0.0–0.5)
HCT: 46.4 % (ref 38.4–49.9)
HGB: 15.7 g/dL (ref 13.0–17.1)
LYMPH%: 18.9 % (ref 14.0–49.0)
MCHC: 33.9 g/dL (ref 32.0–36.0)
MONO#: 0.5 10*3/uL (ref 0.1–0.9)
NEUT#: 5.4 10*3/uL (ref 1.5–6.5)
NEUT%: 72.5 % (ref 39.0–75.0)
Platelets: 245 10*3/uL (ref 140–400)
WBC: 7.4 10*3/uL (ref 4.0–10.3)
lymph#: 1.4 10*3/uL (ref 0.9–3.3)

## 2010-07-01 LAB — LACTATE DEHYDROGENASE: LDH: 207 U/L (ref 94–250)

## 2010-07-05 ENCOUNTER — Ambulatory Visit (HOSPITAL_COMMUNITY): Admission: RE | Admit: 2010-07-05 | Discharge: 2010-07-05 | Payer: Self-pay | Admitting: Oncology

## 2010-07-05 ENCOUNTER — Encounter: Payer: Self-pay | Admitting: Family Medicine

## 2010-09-28 ENCOUNTER — Ambulatory Visit: Payer: Self-pay | Admitting: Oncology

## 2010-10-26 ENCOUNTER — Encounter: Payer: Self-pay | Admitting: Family Medicine

## 2010-10-26 LAB — CBC WITH DIFFERENTIAL/PLATELET
BASO%: 1 % (ref 0.0–2.0)
Basophils Absolute: 0.1 10*3/uL (ref 0.0–0.1)
EOS%: 3.3 % (ref 0.0–7.0)
Eosinophils Absolute: 0.2 10*3/uL (ref 0.0–0.5)
HCT: 47.3 % (ref 38.4–49.9)
HGB: 15.9 g/dL (ref 13.0–17.1)
LYMPH%: 22 % (ref 14.0–49.0)
MCH: 29 pg (ref 27.2–33.4)
MCHC: 33.6 g/dL (ref 32.0–36.0)
MCV: 86.1 fL (ref 79.3–98.0)
MONO#: 0.4 10*3/uL (ref 0.1–0.9)
MONO%: 6.1 % (ref 0.0–14.0)
NEUT#: 4.6 10*3/uL (ref 1.5–6.5)
NEUT%: 67.6 % (ref 39.0–75.0)
Platelets: 247 10*3/uL (ref 140–400)
RBC: 5.5 10*6/uL (ref 4.20–5.82)
RDW: 14 % (ref 11.0–14.6)
WBC: 6.8 10*3/uL (ref 4.0–10.3)
lymph#: 1.5 10*3/uL (ref 0.9–3.3)

## 2010-10-26 LAB — COMPREHENSIVE METABOLIC PANEL
ALT: 69 U/L — ABNORMAL HIGH (ref 0–53)
AST: 44 U/L — ABNORMAL HIGH (ref 0–37)
Albumin: 4.5 g/dL (ref 3.5–5.2)
Alkaline Phosphatase: 83 U/L (ref 39–117)
BUN: 12 mg/dL (ref 6–23)
CO2: 25 mEq/L (ref 19–32)
Calcium: 9.5 mg/dL (ref 8.4–10.5)
Chloride: 101 mEq/L (ref 96–112)
Creatinine, Ser: 0.83 mg/dL (ref 0.40–1.50)
Glucose, Bld: 130 mg/dL — ABNORMAL HIGH (ref 70–99)
Potassium: 3.8 mEq/L (ref 3.5–5.3)
Sodium: 138 mEq/L (ref 135–145)
Total Bilirubin: 0.6 mg/dL (ref 0.3–1.2)
Total Protein: 6.9 g/dL (ref 6.0–8.3)

## 2010-10-26 LAB — LACTATE DEHYDROGENASE: LDH: 210 U/L (ref 94–250)

## 2010-11-07 ENCOUNTER — Encounter: Payer: Self-pay | Admitting: Family Medicine

## 2010-11-07 ENCOUNTER — Encounter (HOSPITAL_COMMUNITY): Payer: Self-pay | Admitting: Oncology

## 2010-11-16 NOTE — Letter (Signed)
Summary: [name]   Imported By: Laural Benes 01/18/2010 11:26:31  _____________________________________________________________________  External Attachment:    Type:   Image     Comment:   External Document

## 2010-11-16 NOTE — Assessment & Plan Note (Signed)
Summary: head and chest congestion/cough/cjr   Vital Signs:  Patient profile:   65 year old [name] Temp:     98.7 degrees F oral BP sitting:   152 / 84  (left arm) Cuff size:   large  Vitals Entered By: Townsend Roger, [place] (November 12, 2009 10:52 AM) CC: low grade fever, cough, chills, sweats, drainage x1 wk   History of Present Illness: Here for one week of low grade fevers, chest congestion, and coughing up yellow sputum. No NVD. Drinking fluids.   Allergies (verified): No Known Drug Allergies  Past History:  Past Medical History: Reviewed history from 09/24/2008 and no changes required. Diverticulitis, hx of Headache Hypertension non-Hodgkins lymphoma, per Dr. [name]  Review of Systems  The patient denies anorexia, weight loss, weight gain, vision loss, decreased hearing, hoarseness, chest pain, syncope, dyspnea on exertion, peripheral edema, headaches, hemoptysis, abdominal pain, melena, hematochezia, severe indigestion/heartburn, hematuria, incontinence, genital sores, muscle weakness, suspicious skin lesions, transient blindness, difficulty walking, depression, unusual weight change, abnormal bleeding, enlarged lymph nodes, angioedema, breast masses, and testicular masses.    Physical Exam  General:  Well-developed,well-nourished,in no acute distress; alert,appropriate and cooperative throughout examination Head:  Normocephalic and atraumatic without obvious abnormalities. No apparent alopecia or balding. Eyes:  No corneal or conjunctival inflammation noted. EOMI. Perrla. Funduscopic exam benign, without hemorrhages, exudates or papilledema. Vision grossly normal. Ears:  External ear exam shows no significant lesions or deformities.  Otoscopic examination reveals clear canals, tympanic membranes are intact bilaterally without bulging, retraction, inflammation or discharge. Hearing is grossly normal bilaterally. Nose:  External nasal examination shows no deformity or  inflammation. Nasal mucosa are pink and moist without lesions or exudates. Mouth:  Oral mucosa and oropharynx without lesions or exudates.  Teeth in good repair. Neck:  No deformities, masses, or tenderness noted. Lungs:  soft scattered rhonchi, no rales   Impression & Recommendations:  Problem # 1:  ACUTE BRONCHITIS (ICD-466.0)  His updated medication list for this problem includes:    Zithromax Z-pak 250 Mg Tabs (Azithromycin) .Marland Kitchen... As directed    Hydromet 5-1.5 Mg/43ml Syrp (Hydrocodone-homatropine) .Marland Kitchen... 1 tsp q 4 hours as needed cough  Complete Medication List: 1)  Lasix 40 Mg Tabs (Furosemide) .... 2 by mouth once daily 2)  Kaon-cl-10 10 Meq Tbcr (Potassium chloride) .Marland Kitchen.. 1 by mouth once daily 3)  Aspir-low 81 Mg Tbec (Aspirin) .Marland Kitchen.. 1 once daily pc 4)  Toprol Xl 200 Mg Xr24h-tab (Metoprolol succinate) .... Once daily 5)  Benicar 40 Mg Tabs (Olmesartan medoxomil) .... Once daily 6)  Zithromax Z-pak 250 Mg Tabs (Azithromycin) .... As directed 7)  Hydromet 5-1.5 Mg/45ml Syrp (Hydrocodone-homatropine) .Marland Kitchen.. 1 tsp q 4 hours as needed cough  Patient Instructions: 1)  Please schedule a follow-up appointment as needed .  Prescriptions: BENICAR 40 MG TABS (OLMESARTAN MEDOXOMIL) once daily  #30 x 11   Entered and Authorized by:   [name] MD   Signed by:   [name] MD on 11/12/2009   Method used:   Print then Give to Patient   RxID:   [id number] HYDROMET 5-1.5 MG/5ML SYRP (HYDROCODONE-HOMATROPINE) 1 tsp q 4 hours as needed cough  #240 x 0   Entered and Authorized by:   [name] MD   Signed by:   [name] MD on 11/12/2009   Method used:   Print then Give to Patient   RxID:   [id number] ZITHROMAX Z-PAK 250 MG TABS (AZITHROMYCIN) as directed  #1 x  0   Entered and Authorized by:   [name] MD   Signed by:   [name] MD on 11/12/2009   Method used:   Print then Give to Patient   RxID:   [id number]

## 2010-11-16 NOTE — Letter (Signed)
Summary: [place]   Imported By: Laural Benes 12/11/2009 09:22:50  _____________________________________________________________________  External Attachment:    Type:   Image     Comment:   External Document

## 2010-11-16 NOTE — Assessment & Plan Note (Signed)
Summary: 1 month rov/njr   Vital Signs:  Patient profile:   65 year old male Temp:     97.6 degrees F oral Pulse rate:   80 / minute BP sitting:   130 / 84  (left arm) Cuff size:   large  Vitals Entered By: Townsend Roger, [place] (November 02, 2009 3:37 PM) CC: f/u on meds   History of Present Illness: Here to follow up on HTN and LE edema. One month ago we stopped all meds containing amlodipine, and adjusted the other meds to make up the difference. He continues on 80 mg a day of Lasix. This has made a big improvement for him with regard to the swelling in his feet and legs, and he feels better. The pain has gone from his feet. He is due to see Oncology again soon.   Current Medications (verified): 1)  Lasix 40 Mg  Tabs (Furosemide) .... 2 By Mouth Once Daily 2)  Kaon-Cl-10 10 Meq  Tbcr (Potassium Chloride) .Marland Kitchen.. 1 By Mouth Once Daily 3)  Aspir-Low 81 Mg Tbec (Aspirin) .Marland Kitchen.. 1 Once Daily Pc 4)  Meclizine Hcl 25 Mg Tabs (Meclizine Hcl) .Marland Kitchen.. 1 Every 4 Hours As Needed Dizziness 5)  Toprol Xl 200 Mg Xr24h-Tab (Metoprolol Succinate) .... Once Daily 6)  Benicar 40 Mg Tabs (Olmesartan Medoxomil) .... Once Daily  Allergies (verified): No Known Drug Allergies  Past History:  Past Medical History: Reviewed history from 09/24/2008 and no changes required. Diverticulitis, hx of Headache Hypertension non-Hodgkins lymphoma, per Dr. [name]  Review of Systems  The patient denies anorexia, fever, weight loss, weight gain, vision loss, decreased hearing, hoarseness, chest pain, syncope, dyspnea on exertion, peripheral edema, prolonged cough, headaches, hemoptysis, abdominal pain, melena, hematochezia, severe indigestion/heartburn, hematuria, incontinence, genital sores, muscle weakness, suspicious skin lesions, transient blindness, difficulty walking, depression, unusual weight change, abnormal bleeding, enlarged lymph nodes, angioedema, breast masses, and testicular masses.    Physical  Exam  General:  Well-developed,well-nourished,in no acute distress; alert,appropriate and cooperative throughout examination Lungs:  Normal respiratory effort, chest expands symmetrically. Lungs are clear to auscultation, no crackles or wheezes. Heart:  Normal rate and regular rhythm. S1 and S2 normal without gallop, murmur, click, rub or other extra sounds. Extremities:  2+ left pedal edema and 2+ right pedal edema.   Not tender   Impression & Recommendations:  Problem # 1:  LEG EDEMA (Z5899001.3)  His updated medication list for this problem includes:    Lasix 40 Mg Tabs (Furosemide) .Marland Kitchen... 2 by mouth once daily  Problem # 2:  HYPERTENSION (ICD-401.9)  His updated medication list for this problem includes:    Lasix 40 Mg Tabs (Furosemide) .Marland Kitchen... 2 by mouth once daily    Toprol Xl 200 Mg Xr24h-tab (Metoprolol succinate) ..... Once daily    Benicar 40 Mg Tabs (Olmesartan medoxomil) ..... Once daily  Complete Medication List: 1)  Lasix 40 Mg Tabs (Furosemide) .... 2 by mouth once daily 2)  Kaon-cl-10 10 Meq Tbcr (Potassium chloride) .Marland Kitchen.. 1 by mouth once daily 3)  Aspir-low 81 Mg Tbec (Aspirin) .Marland Kitchen.. 1 once daily pc 4)  Meclizine Hcl 25 Mg Tabs (Meclizine hcl) .Marland Kitchen.. 1 every 4 hours as needed dizziness 5)  Toprol Xl 200 Mg Xr24h-tab (Metoprolol succinate) .... Once daily 6)  Benicar 40 Mg Tabs (Olmesartan medoxomil) .... Once daily  Patient Instructions: 1)  Stay on same meds. 2)  Please schedule a follow-up appointment in 3 months .

## 2010-11-16 NOTE — Letter (Signed)
Summary: [place]   Imported By: Laural Benes 07/15/2010 13:12:30  _____________________________________________________________________  External Attachment:    Type:   Image     Comment:   External Document

## 2010-11-16 NOTE — Letter (Signed)
Summary: [place]   Imported By: Laural Benes 10/26/2009 11:23:39  _____________________________________________________________________  External Attachment:    Type:   Image     Comment:   External Document

## 2010-11-24 NOTE — Letter (Signed)
Summary: [name]   Imported By: Laural Benes 11/15/2010 12:39:46  _____________________________________________________________________  External Attachment:    Type:   Image     Comment:   External Document

## 2010-12-01 ENCOUNTER — Other Ambulatory Visit: Payer: Self-pay | Admitting: Family Medicine

## 2010-12-30 LAB — POCT I-STAT, CHEM 8
BUN: 13 mg/dL (ref 6–23)
Creatinine, Ser: 0.8 mg/dL (ref 0.4–1.5)
Potassium: 4 mEq/L (ref 3.5–5.1)
Sodium: 140 mEq/L (ref 135–145)

## 2011-01-15 ENCOUNTER — Other Ambulatory Visit: Payer: Self-pay | Admitting: Family Medicine

## 2011-02-01 LAB — COMPREHENSIVE METABOLIC PANEL
ALT: 59 U/L — ABNORMAL HIGH (ref 0–53)
Alkaline Phosphatase: 112 U/L (ref 39–117)
Chloride: 100 mEq/L (ref 96–112)
Creatinine, Ser: 1.02 mg/dL (ref 0.4–1.5)
GFR calc Af Amer: >60 mL/min (ref >60)
GFR calc non Af Amer: >60 mL/min (ref >60)
Potassium: 3.8 mEq/L (ref 3.5–5.1)
Total Protein: 6.9 g/dL (ref 6.0–8.3)

## 2011-02-01 LAB — URINE MICROSCOPIC-ADD ON

## 2011-02-01 LAB — CBC
Hemoglobin: 13.8 g/dL (ref 13.0–17.0)
MCV: 77.1 fL — ABNORMAL LOW (ref 78.0–100.0)
Platelets: 300 10*3/uL (ref 150–400)
RBC: 5.47 MIL/uL (ref 4.22–5.81)
WBC: 11.3 10*3/uL — ABNORMAL HIGH (ref 4.0–10.5)
WBC: 8.7 10*3/uL (ref 4.0–10.5)

## 2011-02-01 LAB — DIFFERENTIAL
Basophils Relative: 0 % (ref 0–1)
Eosinophils Absolute: 0 10*3/uL (ref 0.0–0.7)
Lymphs Abs: 0.9 10*3/uL (ref 0.7–4.0)
Monocytes Relative: 5 % (ref 3–12)
Neutro Abs: 9.9 10*3/uL — ABNORMAL HIGH (ref 1.7–7.7)
Neutrophils Relative %: 87 % — ABNORMAL HIGH (ref 43–77)

## 2011-02-01 LAB — URINALYSIS, ROUTINE W REFLEX MICROSCOPIC
Glucose, UA: NEGATIVE mg/dL
Leukocytes, UA: NEGATIVE
Specific Gravity, Urine: 1.033 — ABNORMAL HIGH (ref 1.005–1.030)
pH: 5.5 (ref 5.0–8.0)

## 2011-02-01 LAB — BASIC METABOLIC PANEL
Calcium: 8.8 mg/dL (ref 8.4–10.5)
Creatinine, Ser: 0.96 mg/dL (ref 0.4–1.5)
GFR calc Af Amer: >60 mL/min (ref >60)
GFR calc non Af Amer: >60 mL/min (ref >60)
Sodium: 140 mEq/L (ref 135–145)

## 2011-02-01 LAB — PHOSPHORUS: Phosphorus: 3.6 mg/dL (ref 2.3–4.6)

## 2011-02-01 LAB — MAGNESIUM: Magnesium: 2.4 mg/dL (ref 1.5–2.5)

## 2011-02-01 LAB — LIPASE, BLOOD: Lipase: 22 U/L (ref 11–59)

## 2011-02-24 ENCOUNTER — Other Ambulatory Visit (HOSPITAL_COMMUNITY): Payer: Self-pay | Admitting: Oncology

## 2011-02-24 ENCOUNTER — Encounter (HOSPITAL_BASED_OUTPATIENT_CLINIC_OR_DEPARTMENT_OTHER): Payer: Medicare Other | Admitting: Oncology

## 2011-02-24 DIAGNOSIS — C8589 Other specified types of non-Hodgkin lymphoma, extranodal and solid organ sites: Secondary | ICD-10-CM

## 2011-02-24 DIAGNOSIS — C8590 Non-Hodgkin lymphoma, unspecified, unspecified site: Secondary | ICD-10-CM

## 2011-02-24 DIAGNOSIS — C8583 Other specified types of non-Hodgkin lymphoma, intra-abdominal lymph nodes: Secondary | ICD-10-CM

## 2011-02-24 LAB — CBC WITH DIFFERENTIAL/PLATELET
Basophils Absolute: 0 10*3/uL (ref 0.0–0.1)
EOS%: 3.7 % (ref 0.0–7.0)
Eosinophils Absolute: 0.3 10*3/uL (ref 0.0–0.5)
HCT: 45.4 % (ref 38.4–49.9)
HGB: 15.6 g/dL (ref 13.0–17.1)
MCH: 29.5 pg (ref 27.2–33.4)
MONO#: 0.5 10*3/uL (ref 0.1–0.9)
NEUT#: 4.8 10*3/uL (ref 1.5–6.5)
RDW: 13.6 % (ref 11.0–14.6)
WBC: 7.2 10*3/uL (ref 4.0–10.3)
lymph#: 1.6 10*3/uL (ref 0.9–3.3)

## 2011-02-24 LAB — COMPREHENSIVE METABOLIC PANEL
BUN: 13 mg/dL (ref 6–23)
CO2: 22 mEq/L (ref 19–32)
Creatinine, Ser: 0.82 mg/dL (ref 0.40–1.50)
Glucose, Bld: 127 mg/dL — ABNORMAL HIGH (ref 70–99)
Sodium: 138 mEq/L (ref 135–145)
Total Bilirubin: 0.3 mg/dL (ref 0.3–1.2)
Total Protein: 6.9 g/dL (ref 6.0–8.3)

## 2011-02-24 LAB — LACTATE DEHYDROGENASE: LDH: 186 U/L (ref 94–250)

## 2011-03-01 NOTE — H&P (Signed)
NAMEQUINTAVIS, [name]             ACCOUNT NO.:  [id number]   MEDICAL RECORD NO.:  [id number]          PATIENT TYPE:  INP   LOCATION:  52                         FACILITY:  [facility]   PHYSICIAN:  Stark Klein, MD       DATE OF BIRTH:  [birth year]   DATE OF ADMISSION:  11/19/2008  DATE OF DISCHARGE:                              HISTORY & PHYSICAL   CHIEF COMPLAINT:  Abdominal pain, nausea, vomiting.   HISTORY OF PRESENT ILLNESS:  [name] is a 65 year old male with  history of lymphoma and diverticulitis who has had a chronic umbilical  hernia.  He presents with 1-2 weeks of decreased appetite and abdominal  pain, nausea and vomiting.  He continues to have flatus and bowel  movements.  He has had several episodes of significant vomiting and  sought help in the emergency department several days ago.  He does not  describes the emesis as bilious, but it is food containing.  He denies  fevers but has had chills today with an episode of feeling lightheaded  and needing to vomit.  He does not have any diarrhea and has had no  bloody stools.  His last chemotherapy was in May.  He has had  progression of his intra-abdominal rate.  He has had reduction in size  of his intra-abdominal lesions.   PAST MEDICAL HISTORY:  1. Hypertension.  2. Lymphoma.   PAST SURGICAL HISTORY:  Repair of strangulated epigastric hernia with  mesh.  This was done by Dr. Bubba Camp in 2004.  There is no record of any  bowel resection.   FAMILY HISTORY:  His son had osteosarcoma, and he had several other  family members with uncommon malignancies.   SOCIAL HISTORY:  Denies substance abuse and is accompanied by his wife.   ALLERGIES:  None.   MEDICATIONS:  1. Toprol XL 100 mg once a day.  2. Verapamil controlled release 180 mg once a day.  3. Potassium chloride 10 mEq once a day.  4. Lasix 40 mg once a day.  5. Aspirin 81 mg once a day.  6. P.r.n. Vicodin in the last few weeks.   REVIEW OF SYSTEMS:   Otherwise, negative except as in HPI times 11  systems.   PHYSICAL EXAMINATION:  VITAL SIGNS:  Pulse is 93, temperature 97.3,  respirations 18, blood pressure 143/89.  GENERAL:  He is alert and oriented at x3 in no acute distress.  HEENT:  Normocephalic atraumatic.  Has a medium sized skin tag below his  right eye.  It is like a pedunculated verrugas-type lesion.  NECK:  Supple with no lymphadenopathy.  CHEST:  Clear.  ABDOMEN:  Demonstrates midline incision in the supraumbilical with  umbilical hernia.  It is still reducible, but he is obese, and it is not  clear if this is definitely the fascial edge that I am feeling or not.  ABDOMEN:  Not tender except there is a hernia sac.  EXTREMITIES:  Warm, well perfused with trace edema.  SKIN:  Normal.   LABORATORIES AND X-RAYS:  CT scan is pending.  White count is 11,  creatinine is 1.02.   IMPRESSION:  [name] is 65 year old male with partial small bowel  obstruction from an intermittently incarcerated umbilical hernia.  We  will hydrate him and give some bowel rest.  If he starts throwing up  again, will place an NG tube.  Will also obtain another CT scan to  evaluate contents of the hernia.  Otherwise, will plan laparoscopic  possible open hernia repair in the next week or so.      Stark Klein, MD  Electronically Signed     FB/MEDQ  D:  11/19/2008  T:  11/20/2008  Job:  [id number]

## 2011-03-01 NOTE — Discharge Summary (Signed)
NAMEALEJANDRO, AJELLO             ACCOUNT NO.:  [id number]   MEDICAL RECORD NO.:  [id number]          PATIENT TYPE:  INP   LOCATION:  1339                         FACILITY:  [facility]   PHYSICIAN:  Jeanie Cooks, M.D.DATE OF BIRTH:  26-Oct-1945   DATE OF ADMISSION:  02/05/2008  DATE OF DISCHARGE:  02/07/2008                               DISCHARGE SUMMARY   DISCHARGE DIAGNOSES:  1. Admission for fever and shortness of breath.  2. 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  3. Morbid obesity.  4. Hypertension.  5. Full code.   HISTORY:  [name] is a 65 year old white male who has been  undergoing chemotherapy as of December 14, 2007, for a fairly recent  diagnosis of the B-cell non-Hodgkin's lymphoma, anaplastic variant of  diffuse large cell lymphoma.  Diagnosis was made in January.  The  patient has been treated every 2 weeks and received his most recent  treatment, cycle #4 on January 25, 2008.  He had been tolerating his  treatments extraordinarily well and, in fact, had, had a CT PET scan  carried out on January 23, 2008, that had shown no areas of abnormal  metabolic activity.  I believe there were some greatly reduced masses  that this did not take up the isotope.  The patient was admitted to the  hospital because of several days of increasing weakness, shortness of  breath, and on the day of admission fairly severe shortness of breath  and weakness.  The patient's temperature home was of 101.2.  When we saw  him in the cancer center, he was cold and clammy, but afebrile.  He  seemed to be short of breath.  He certainly appeared ill.  We carried  out a chest x-ray and ultimately a CT angiogram of the chest that did  not show any severe abnormalities.  Because of the patient's fever, his  immune suppression and his symptoms and clinical impressions,  particularly with regard to the possibility of sepsis, the patient was  admitted to the hospital and started on Cipro.   HOSPITAL COURSE:  Blood cultures obtained at the cancer center and in  the hospital have shown no growth thus far.  The patient's maximum  temperature was 100.6.  Other studies were basically unremarkable.  He  was anemic with a hemoglobin of 9.5, hematocrit 28.4.  His albumin was  surprisingly level, certainly lower than it had been at 2.5.  The rest  of the labs were essentially unremarkable.  As stated, the patient was  treated with Cipro empirically.  His course over the last 36 hours has  been one of improvement.  He is running a minimal low-grade fever,. but  feels well and is anxious to be discharged today.  He will be discharged  today on the following medicines was no restrictions.   DISCHARGE MEDICATIONS:  1. Allopurinol 300 mg daily.  2. Lasix 40 mg daily.  3. Toprol XL 100 mg daily.  4. Protonix 40 mg daily.  5. Aspirin 81 mg daily.  6. Zofran 8 mg  every 8-12 hours as needed.  7. Magic Mouth Wash four times a day as needed.  8. K-Tab 10 mEq daily.  9. Verapamil 180 mg daily.  10.Ativan 1 mg as needed for nausea and anxiety.  11.He takes Decadron with his treatments.  12.He has Norco 10/325 one every 4 hours as needed.  13.We are stopping the Cipro.   The patient was originally due to see me either today or tomorrow for  another course of chemotherapy, cycle #5.  We will postpone that  appointment and treatment to late next week.  This will be arranged, and  the patient understands to call us if we do not call him.  The patient  is being discharged in an improved condition.      Jeanie Cooks, M.D.  Electronically Signed     DSM/MEDQ  D:  02/07/2008  T:  02/07/2008  Job:  [id number]

## 2011-03-01 NOTE — Op Note (Signed)
NAMEJACOBEE, [name]             ACCOUNT NO.:  [id number]   MEDICAL RECORD NO.:  [id number]          PATIENT TYPE:  INP   LOCATION:  [place]                         FACILITY:  [facility]   PHYSICIAN:  Adin Hector, MD     DATE OF BIRTH:  [birth year]   DATE OF PROCEDURE:  11/20/2008  DATE OF DISCHARGE:                               OPERATIVE REPORT   PRIMARY CARE PHYSICIAN:  Dr. [name].   ONCOLOGIST:  Dr. Haynes Bast. Murinson.   SURGEON:  Dr. Adin Hector.   PREOPERATIVE DIAGNOSES:  1. Small bowel obstruction, secondary to incarcerated ventral hernia,      status post recent reduction.  2. 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   POSTOPERATIVE DIAGNOSES:  1. Small bowel obstruction, secondary to incarcerated ventral hernia,      status post recent reduction.  2. 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   PROCEDURE PERFORMED:  1. Diagnostic laparoscopy.  2. Laparoscopic ventral hernia repair (15 cm x 20 cm Ultra lightweight      polypropylene/cellulose = Proceed mesh).  3. Removal of right inferior eyelid skin mass x2.   ANESTHESIA:  1. General anesthesia.  2. Local anesthetic in a field block around all ports and fascial      stitch sites.   DRAINS:  None.   ESTIMATED BLOOD LOSS:  Less than 30 mL.   COMPLICATIONS:  None apparent.   INDICATIONS FOR PROCEDURE:  [name] is a pleasant morbidly obese  65 year old gentleman who has had a periumbilical hernia that was fixed  with under-lay mesh in the past.  Unfortunately he developed a  recurrence.  He has noticed some increasing symptoms and actually had an  episode where it popped out and stayed out.  He had some nausea and  vomiting and abdominal pain.  He came to the emergency room.  Dr. Stark Klein was able to reduce it back in and admitted him with IV fluids and  a nasogastric decompression.   He said the following day he is feeling much better with decrease in his  gastric tube output  and flatus, but I feel an obvious periumbilical  hernia, although it seems to be more reducible.  The anatomy and  physiology of abdominal formation was discussed plus physiology of  incisional herniation with its risks of incarceration, strangulation and  debilitating pain were discussed.  Options were discussed.  Recommendations were made for diagnostic laparoscopy, to rule out with  probable ventral hernia repair with mesh.  Possibility of small bowel  resection, need for conversion to open or use biological mesh, with its  increased risks of infection or recurrence were discussed as well.  The  risks, benefits and alternatives were discussed.  Questions were  answered and he has agreed to proceed.   The patient also has an enlarging mass on the inferior part of his right  eyelid.  A request was made and I thought it would be reasonable to go  ahead and do an excisional biopsy of this mass.  The risks of bleeding,  recurrence, injury to other  organs, including the eyelid and eye were  discussed as well.  Questions were answered and he agrees to proceed.   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  It was able to be reduced and freed down.  The defect  was about 4 cm x 4.5 cm.  The prior open repair mesh had separated  inferiorly.  There was no evidence of any small bowel necrosis.   The right inferior eyelid masses, one of them seemed more consistent  with an acrochordon and the other one had some verrucous nature to it,  perhaps an atypical acrochordon versus a skin cancer.   DESCRIPTION OF PROCEDURE:  An informed consent was confirmed.  The  patient received 2 grams of IV cefoxitin just prior to surgery.  He had  sequential compression devices applied for the case.  He underwent  general anesthesia without any difficulty.  He was positioned supine  with arms tucked  to a foot board and a Foley catheter sterilely placed.  His abdomen was cleaned, prepped and draped in a sterile fashion.  A 5  mm port was placed in the left upper quadrant using optical entry  technique with the patient in the steep reverse Trendelenburg with the  right side up.  Camera inspection revealed no intra-abdominal injury.  The patient was repositioned more supine and under direct visualization  a 5 mm port was placed in the left mid-abdomen and a 12 mm port was  placed in the left upper quadrant.  The camera inspection revealed the  findings noted above.  He had still some attachments of hernia sac to  mid-jejunum.  These were carefully freed off sharply, and allowed to  reduce it down.  The hernia sac was re-measured.  The small bowel was  run from the ileocecal region to the ligament of Treitz.  The distal  half of the small bowel was well collapsed and decompressed.  It did  thicken up near where the chronic incarceration had been.  I did a  little bit of lysis of adhesions to unfold some of the chronic  incarcerated small bowel loops, but I avoided over-dissection.  He did  have some mesenteric thickening.  I did notice some possible  lymphadenopathy in the small bowel mesentery, consistent with his prior  diagnosis of non-Hodgkin's lymphoma but I did not do more aggressive  intervention.   Because there was no evidence of any significant necrosis and only a  little mild coagulum on the serosa, but no evidence of any major  contamination, I decided to repair this with permamnent mesh.  A 15 cm x  20 cm dual side mesh was chosen.  I placed #1 Novofil stitches x12  around the edges of this elliptical mesh.  The mesh was rolled in, rough  side in, placed in an unroll.  It was then attached to the anterior  bowel wall using laparoscopic suture passer, taking fascia divided  transversely.  There was at least 5 cm of circumferential over-coverage,  to get result.  Tacks were  used to tack some of the edges around.  Please note that just prior to tacking the fascial stitches down, I went  ahead and used an laparoscopic suture passer and using #0 Vicryl  interrupted stitches, primarily closed the ventral hernia fascial defect  primarily, transversely as well.   Camera inspection revealed no intra-abdominal entry and a good closure.  Hemostasis is excellent.  The Capnoperitoneum was evacuated and ports  were removed.  Note that the 10 mm fascial defect had been closed using  #0 Vicryl stitches and laparoscopic suture passer just prior to  evacuation of the Capnoperitoneum.  The skin was closed using #45-0  Monocryl stitch on the larger port sites.  The small puncture holes and  the fascial stitches were closed using Steri-Strips.  Sterile dressings  applied.  Some gauze was placed through the umbilicus and an abdominal  binder was placed around it.   After closure of the abdomen and prior to extubation, his right orbit  was prepped and draped in sterile fashion.  I was able to lift the  inferior eyelid pedunculated mass and transect it using an 11-blade  scalpel.  Touch-point cautery was used for hemostasis.  There was a  smaller lesion slightly more lateral on the inferior eyelid and that was  removed in a similar fashion.  Hemostasis was excellent.  Because he had  excess skin tissue that naturally folded on it, I did not feel it  required an extra stitch.  Pressure was held for another 5 minutes.  Hemostasis remained excellent.   The patient was extubated and sent to the recovery room in stable  condition.  I discussed postoperative care with the patient and with his  son just prior to surgery.  I cannot find his son right now.  I will try  to make a note to locate him later.      Adin Hector, MD  Electronically Signed     SCG/MEDQ  D:  11/20/2008  T:  11/20/2008  Job:  [id number]   cc:   Ishmael Holter. [name], MD  [address] [place]   [place] [address]   Jeanie Cooks, M.D.  Fax: [phone]

## 2011-03-01 NOTE — Op Note (Signed)
[name], [name]             ACCOUNT NO.:  [id number]   MEDICAL RECORD NO.:  [id number]          PATIENT TYPE:  INP   LOCATION:  [place]                         FACILITY:  [facility]   PHYSICIAN:  Adin Hector, MD     DATE OF BIRTH:  [birth year]   DATE OF PROCEDURE:  DATE OF DISCHARGE:                               OPERATIVE REPORT   ADDENDUM:      Adin Hector, MD  Electronically Signed     SCG/MEDQ  D:  11/20/2008  T:  11/20/2008  Job:  [id number]

## 2011-03-01 NOTE — H&P (Signed)
NAMETILMAN, MEINEKE             ACCOUNT NO.:  [id number]   MEDICAL RECORD NO.:  [id number]          PATIENT TYPE:  INP   LOCATION:  1339                         FACILITY:  [facility]   PHYSICIAN:  Jeanie Cooks, M.D.DATE OF BIRTH:  1946/03/15   DATE OF ADMISSION:  02/05/2008  DATE OF DISCHARGE:                              HISTORY & PHYSICAL   PRIORITY ADMISSION NOTE.   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  The patient has thus far received 4 cycles of Rituxan in  combination with CHOD chemotherapy.  Chemotherapy was initiated on  December 14, 2007.  Patient has been treated every 2 weeks.  His last  treatment was on April 10.  He is tolerating his treatments  extraordinarily well and has had an excellent response as by a recent CT  PET scan that was carried out on January 23, 2008 showing no abnormal areas  of metabolic activity.   Following the patient's last treatment on January 25, 2008 he noted more  weakness which has progressed along with increasing shortness of breath,  lightheadedness.  This afternoon the patient had chills without rigors.  He felt chilly.  He took his temperature which was 101.2.  The patient  called and we had him come into the [place].  He was noted to be  short of breath, somewhat cold and clammy.  The patient was afebrile in  the cancer center.  Chest x-ray was obtained and showed no acute  abnormalities.  There was some minimal streaking, scarring or  atelectasis.  CBC obtained today was as follows.  White count 7.0, ANC  5.9, hemoglobin 11.0, hematocrit 32.0, platelets 201,000.  On 04/17 ANC  had been 1.3.  The patient also underwent a CT angiogram of the chest  because of concerns about pulmonary emboli given his shortness of breath  and pO2 on room air of  93-94%.  We have verbal report on the CT scan  which was negative for any type of abnormality, specifically pneumonia  or pulmonary emboli.   Because of the patient's fever, his immune suppression and the fact that  he it is symptomatic, he is being admitted at.  The possibility of  sepsis cannot be excluded.  The patient does not have a Port-A-Cath.  I  should add that he denies any cough, any type of chest discomfort,  urinary symptomatology, any areas that might be skin or soft tissue  infection.  He has had no nausea or vomiting or diarrhea.   PAST MEDICAL HISTORY:  The patient has morbid obesity and has had  hypertension for a number of years.  In general, he has enjoyed good  health.   PAST SURGICAL HISTORY:  Includes tonsillectomy at age 12, repair of a  deviated septum when he was 65 years old, repair and abdominal hernia in  2004 by Dr. Ronnie Derby.  The patient had infectious mononucleosis when  he was 65 years old.  He denies any injuries.  No history of any blood  transfusions.  No allergies to any medicines.   CURRENT MEDICATIONS:  Are as follows, allopurinol 300 mg daily, Zofran 8  mg as needed, Toprol XL 100 mg daily, Protonix 40 mg daily, Norco 10/325  as needed every 4 hours, Magic Mouthwash 10 mL q.i.d. as needed, Lasix  40 mg daily, K-Tab 10 mEq daily, Decadron which he takes with his  chemotherapy treatments days 2 through 4, Calan 180 mg daily, Ativan 1  mg as needed, aspirin 81 mg daily.   FAMILY HISTORY:  The patient's father died of colon cancer at age 35.  There is a positive family history of diabetes mellitus.  Maternal  grandfather had an unknown cancer than and died at age 49.  A maternal  uncle had cancer and died at age 63.  A paternal uncle died in his late  33s possibly with pancreatic cancer.  The patient has no siblings.  His  son was diagnosed at age 17 with an osteogenic sarcoma involving his  wrist.  He was treated with surgical resection and  chemotherapy.  He  remains disease free 10 years later now age 53.  The patient's son is a  Marine scientist at [employer] and resides in [place].  The patient has a daughter age 71  who is a Ship broker at [employer].  He has no grandchildren.  He denies any  family history of blood disorders.   SOCIAL HISTORY:  [name] had been a cigarette smoker for over 30  years.  He smoked a pack-and-a-half to two packs of cigarettes a day.  He stopped smoking in 2000.  He denies any other use of tobacco  products.  The patient had been drinking wine or beer couple times a  week prior to his diagnosis of lymphoma.  No use of IV drugs.  He was  born [place], [place], served in [place] for 1 year.  Also was in  [place] for a couple years.  He came to [place] in 1971.  He is  currently separated from his wife, Zigmund Daniel.  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  I believe he  is currently on leave.  He lives with his 67 year old mother who is  quite debilitated.  The patient lives in a two-level home.  He works the  night shift 10:30 p.m. to 7:00 a.m.  He drives and enjoys driving  motorcycles.   REVIEW OF SYSTEMS:  Essentially negative as described above.  I should  mention the patient denied any type of chest pain or chest discomfort.  Vision and hearing are good.  No neurologic symptoms other than some  lightheadedness.  No hay fever or sinus problems.  He has had a lot of  fatigue and no energy in recent weeks.  He has had mild dyspnea on  exertion for some time, but clearly his dyspnea is worse.  His weight  had been stable for a number years at about 350 pounds.  Prior to  diagnosis of lymphoma.  He had lost about 20 pounds.  His weight has  been fairly stable since then.  No GI symptomatology.  The patient has  never had a colonoscopy.  No liver problems or jaundice.  No urinary  problems or symptoms.  No swelling of the legs, history of phlebitis,   intermittent claudication.  No bleeding or bruising problems.  He has  had possibly some arthritis in his thumbs.  He does not usually run  fever.  He denies any night sweats.  No skin problems or rashes.  No  history of depression.   PHYSICAL EXAM:  [name] is a very large gentleman, somewhat  obese, currently weighing about 333 pounds. Height 74 inches, body  surface area 2.6-2.7 m2.  He currently is afebrile with temperature of  97.9, pulse 96 and regular, respirations 18 unlabored, although he seems  dyspneic on minimal exertion.  Blood pressure 144/86.  O2 saturation on  room air is 96% at rest.  The patient had been somewhat cold and clammy earlier.  Skin is still  damp.  There is scalp alopecia secondary to chemotherapy.  No scleral icterus.  Pupillary and extraocular movements normal.  Mouth and pharynx benign.  Dentition is poor.  No adenopathy in the neck, axillary, or inguinal areas.  BACK:  No set of skeletal tenderness.  He has sebaceous cyst.  Lungs were clear to percussion and auscultation.  CARDIAC:  Exam regular rhythm without murmur, rub.  Abdomen is quite obese with a diastases recti and an umbilical hernia,  otherwise negative.  No tenderness.  EXTREMITIES:  Chronic stasis changes with hemosiderin deposits but no  pitting edema, clubbing, primary erythema, petechiae or purpura.  NEUROLOGIC: Exam was grossly normal.  No focal deficits and the patient  is appropriate and alert.   LABORATORY DATA:  White count 7.0, ANC 5.9, hemoglobin 11.0, hematocrit  32.0, platelets 201,000.  He had chemistries and blood cultures x2 drawn  in the [place].  Chest x-ray and CT angiogram of the chest are  negative as described above.   IMPRESSION AND PLAN:  [name] will be admitted to the hospital  apparently because of fever and shortness of breath in the setting of  immunosuppressant chemotherapy.  The patient is day 12 with recently  recovered blood counts.   He does not have a Port-A-Cath.  However,  cannot rule out possibly of sepsis at this time.  As stated, blood  cultures were obtained.  I believe the patient has just recovered from  neutropenic state of his treatment.  We will cover him with IV Cipro.  We will follow him closely in the hospital for any abnormality in vital  signs or recurrent fever.  Will check a BNP.  The patient has been  receiving Adriamycin.  Will also get an EKG.  Other problems include the history of his non-Hodgkin's lymphoma which  presented with bulky abdominal and pelvic adenopathy as well as cervical  adenopathy but a negative bone marrow, i.e. stage 3 disease.  Other  problems include morbid obesity, hypertension.  The patient is Full  Code.      Jeanie Cooks, M.D.  Electronically Signed     DSM/MEDQ  D:  02/05/2008  T:  02/05/2008  Job:  [id number]

## 2011-03-01 NOTE — Discharge Summary (Signed)
[name], [name]             ACCOUNT NO.:  [id number]   MEDICAL RECORD NO.:  [id number]          PATIENT TYPE:  INP   LOCATION:  [place]                         FACILITY:  [facility]   PHYSICIAN:  Adin Hector, MD     DATE OF BIRTH:  [birth year]   DATE OF ADMISSION:  11/19/2008  DATE OF DISCHARGE:  11/22/2008                               DISCHARGE SUMMARY   PRIMARY CARE PHYSICIAN:  Dr. Alysia Penna.   ONCOLOGIST:  Dr. Santiago Bur.   PRINCIPAL DIAGNOSIS:  Incarcerated ventral hernia with small bowel  obstruction.   OTHER DIAGNOSES:  Include:  1. Lymphoma, followed by Dr. [name].  2. Prior epigastric hernia, repair with mesh in 2004.  3. Hypertension.  4. Verruca vulgaris, right inferior eyelid.   PRINCIPAL PROCEDURE PERFORMED:  1. Laparoscopic lysis of adhesions.  2. Ventral hernia repair with mesh.  3. Removal of right inferior eyelid skin masses x2.   SUMMARY OF HOSPITAL COURSE:  [name] is a jovial 65 year old  gentleman who unfortunately had a recurrent hernia with a small bowel  obstruction on his abdominal wall.  He had an NG tube placed and was  hydrated.  He was taken to the operating room on hospital day #2.  I did  a laparoscopic lysis of adhesions and a repair of a ventral hernia with  a large piece of mesh.  Postoperatively, he was switched over from IV to  oral pain medications.  He was increased ambulation in the hallways.  He  had some return of bowel function.   By the time of postop day #2, he was recovering well and we thought it  would be safe for him to transition home for the following instructions:  1. He is to return to clinic to see me in about 2 weeks.  2. He should call if he has worsening fevers, chills, sweats, pain,      nausea, vomiting, or other concerns.  3. He should use ice packs and/or heating pads every day.  He can use      a warm compresses every 2 hours as well as needed.  4. He should use Aleve 2 p.o. every 12 hours for  the next 2 weeks for      pain and then wean himself off.  5. He can use Vicodin 1 to 2 p.o. every 4 hours p.r.n. breakthrough      pain.   He can resume his home medications which include:  1. Toprol XL 100 mg daily.  2. Verapamil 180 mg daily.  3. Potassium chloride 10 mEq daily.  4. Lasix 40 mg daily.  5. Aspirin 81 mg daily.   He should walk at least a half hour if not an hour a day to remain  physically active and avoid any issues.  He should watch out for  constipation, use laxatives and stool softeners as needed.      Adin Hector, MD  Electronically Signed     SCG/MEDQ  D:  12/18/2008  T:  12/18/2008  Job:  [id number]   cc:   Jeanie Cooks, M.D.  Fax: [place]. [name], [place]  [place] [address]

## 2011-03-04 NOTE — Op Note (Signed)
NAME:  [name], [name]                       ACCOUNT NO.:  [id number]   MEDICAL RECORD NO.:  [id number]                   PATIENT TYPE:  OIB   LOCATION:  2899                                 FACILITY:  [facility]   PHYSICIAN:  Kathrin Penner, M.D.                DATE OF BIRTH:  September 10, 1946   DATE OF PROCEDURE:  02/20/2003  DATE OF DISCHARGE:                                 OPERATIVE REPORT   PREOPERATIVE DIAGNOSIS:  Strangulated epigastric hernia.   POSTOPERATIVE DIAGNOSIS:  Strangulated epigastric hernia.   OPERATION PERFORMED:  Repair of strangulated epigastric hernia with mesh.   SURGEON:  Kathrin Penner, M.D.   ASSISTANT:  Nurse.   ANESTHESIA:  General.   INDICATIONS FOR PROCEDURE:  The patient is a 65 year old man presenting with  an epigastric mass located at just above the umbilicus associated with  severe pain but without fever, leukocytosis.  The mass has been getting  bigger and more painful over the ensuing 24 hours.  He comes to the  operating room now after the risks and potential benefits of surgery have  been fully discussed and all questions answered and consent obtained.   DESCRIPTION OF PROCEDURE:  Following induction of satisfactory general  anesthesia, the patient was positioned supinely.  The abdomen routinely  prepped and draped to be included in a sterile operative field.  A  supraumbilical incision extending from the edge of umbilicus into the  epigastrium.  It was deepened through the skin and subcutaneous tissue  carrying the dissection down to this mass.  The mass was dissected free at  all sides with dissection carried down to the fascia.  The mass was very  hard.  This was dissected free of the fascia with remaining attachments to  the intra-abdominal omentum divided with electrocautery.  The mass on  opening it contained necrotic omentum.  This was forwarded for pathologic  evaluation.  Sponge and instrument counts were verified.  The abdominal  wall  defect was recurring with a polypropylene mesh plug sutured in place after a  slurry of Seprafilm had been placed on the viscera to protect it from the  mesh.  The mesh was sewn in with interrupted 0 Novofil sutures.  Repair was  noted to be intact.  Again sponge and instrument counts were verified.  The  subcutaneous tissues were closed with a running 2-0  Vicryl and the skin was  closed with a running 4-0 Monocryl.  The wound was then reinforced with Steri-Strips and sterile dressings were  applied.  Anesthetic was reversed and the patient removed from the operating  room to the recovery room in stable condition, having tolerated the  procedure well.  Kathrin Penner, M.D.    PB/MEDQ  D:  02/20/2003  T:  02/20/2003  Job:  [id number]   cc:   Shanna Cisco., M.D.  [place]. [place]  [place] [address]  Fax: [phone]

## 2011-03-13 ENCOUNTER — Other Ambulatory Visit: Payer: Self-pay | Admitting: Family Medicine

## 2011-06-27 ENCOUNTER — Other Ambulatory Visit (HOSPITAL_COMMUNITY): Payer: Medicare Other

## 2011-07-07 LAB — CBC
Hemoglobin: 13.4
MCHC: 33.3
Platelets: 427 — ABNORMAL HIGH
RDW: 14.9

## 2011-07-07 LAB — PROTIME-INR
INR: 1
Prothrombin Time: 13.4

## 2011-07-08 LAB — CBC
Platelets: 407 — ABNORMAL HIGH
RDW: 15.4
WBC: 7.5

## 2011-07-12 LAB — URINALYSIS, ROUTINE W REFLEX MICROSCOPIC
Glucose, UA: NEGATIVE
Hgb urine dipstick: NEGATIVE
Ketones, ur: NEGATIVE
Protein, ur: NEGATIVE
pH: 6.5

## 2011-07-12 LAB — COMPREHENSIVE METABOLIC PANEL
BUN: 9
CO2: 25
Chloride: 101
Creatinine, Ser: 0.87
GFR calc non Af Amer: >60
Total Bilirubin: 0.7

## 2011-07-12 LAB — D-DIMER, QUANTITATIVE: D-Dimer, Quant: 0.51 — ABNORMAL HIGH

## 2011-07-12 LAB — URINE CULTURE
Colony Count: NO GROWTH
Culture: NO GROWTH

## 2011-07-12 LAB — B-NATRIURETIC PEPTIDE (CONVERTED LAB): Pro B Natriuretic peptide (BNP): <30

## 2011-07-12 LAB — CULTURE, BLOOD (ROUTINE X 2)

## 2011-07-12 LAB — CBC
HCT: 28.4 — ABNORMAL LOW
MCHC: 33.5
MCV: 75.8 — ABNORMAL LOW
Platelets: 167

## 2011-07-12 LAB — DIFFERENTIAL
Basophils Absolute: 0
Lymphocytes Relative: 5 — ABNORMAL LOW
Neutro Abs: 5.6
Neutrophils Relative %: 85 — ABNORMAL HIGH

## 2011-07-12 LAB — LACTATE DEHYDROGENASE: LDH: 184

## 2011-07-16 ENCOUNTER — Other Ambulatory Visit: Payer: Self-pay | Admitting: Family Medicine

## 2011-07-19 NOTE — Telephone Encounter (Signed)
Script sent e-scribe 

## 2011-10-10 ENCOUNTER — Other Ambulatory Visit: Payer: Self-pay | Admitting: Family Medicine

## 2011-10-10 NOTE — Telephone Encounter (Signed)
Can we refill this? Looks like the pt has not been here in awhile.

## 2011-10-13 ENCOUNTER — Telehealth: Payer: Self-pay | Admitting: Family Medicine

## 2011-10-13 NOTE — Telephone Encounter (Signed)
Refill request for Furosemide 40 mg take 1 po bid and looks like the pt has not been seen in awhile.

## 2011-10-17 MED ORDER — FUROSEMIDE 40 MG PO TABS: 40.0000 mg | ORAL_TABLET | Freq: Two times a day (BID) | ORAL | Status: DC

## 2011-10-17 NOTE — Telephone Encounter (Signed)
Call in #60 with no rf. He needs to see me soon

## 2011-10-17 NOTE — Telephone Encounter (Signed)
Rx sent to pharmacy   

## 2011-11-15 ENCOUNTER — Other Ambulatory Visit: Payer: Self-pay | Admitting: Family Medicine

## 2011-12-05 ENCOUNTER — Other Ambulatory Visit: Payer: Self-pay | Admitting: Family Medicine

## 2011-12-05 NOTE — Telephone Encounter (Signed)
Can we refill this? 

## 2011-12-09 ENCOUNTER — Other Ambulatory Visit: Payer: Self-pay | Admitting: Family Medicine

## 2011-12-09 NOTE — Telephone Encounter (Signed)
Script sent e-scribe and spoke with pt. 

## 2011-12-09 NOTE — Telephone Encounter (Signed)
Pt has sch an ov for Monday 12/12/11 at 3:15. Pt is req to get refill of BENICAR 40 MG tablet to [facility] on [address] at [address]. Pt has taken last pill.

## 2011-12-12 ENCOUNTER — Encounter: Payer: Self-pay | Admitting: Family Medicine

## 2011-12-12 ENCOUNTER — Ambulatory Visit (INDEPENDENT_AMBULATORY_CARE_PROVIDER_SITE_OTHER): Payer: Medicare Other | Admitting: Family Medicine

## 2011-12-12 VITALS — BP 142/80 | HR 96 | Temp 98.7°F

## 2011-12-12 DIAGNOSIS — I10 Essential (primary) hypertension: Secondary | ICD-10-CM

## 2011-12-12 DIAGNOSIS — E538 Deficiency of other specified B group vitamins: Secondary | ICD-10-CM | POA: Diagnosis not present

## 2011-12-12 LAB — POCT URINALYSIS DIPSTICK
Spec Grav, UA: 1.02
Urobilinogen, UA: 1

## 2011-12-12 MED ORDER — FUROSEMIDE 40 MG PO TABS: 40.0000 mg | ORAL_TABLET | Freq: Two times a day (BID) | ORAL | Status: DC

## 2011-12-12 MED ORDER — OLMESARTAN MEDOXOMIL 40 MG PO TABS: 40.0000 mg | ORAL_TABLET | Freq: Every day | ORAL | Status: DC

## 2011-12-12 MED ORDER — METOPROLOL SUCCINATE ER 200 MG PO TB24: 200.0000 mg | ORAL_TABLET | Freq: Every day | ORAL | Status: DC

## 2011-12-12 MED ORDER — POTASSIUM CHLORIDE CRYS ER 10 MEQ PO TBCR: 10.0000 meq | EXTENDED_RELEASE_TABLET | Freq: Every day | ORAL | Status: DC

## 2011-12-12 NOTE — Progress Notes (Signed)
  Subjective:    Patient ID: [name], male    DOB: 09-09-1946, 66 y.o.   MRN: [id number]  HPI Here to follow up on HTN and leg edema. His BP has been stable. He feels well in general except for some chronic fatigue and SOB. He tries to exercise daily but it is difficult. He walks about 10-15 minutes on a treadmill at home, but then he has to stop. His weight is up, but he says he does not eat very much. He last saw Dr. [name] about 6 months ago.    Review of Systems  Constitutional: Positive for fatigue.  Respiratory: Positive for shortness of breath. Negative for apnea, cough, choking, chest tightness, wheezing and stridor.   Cardiovascular: Negative.        Objective:   Physical Exam  Constitutional:       Morbidly obese   Neck: No thyromegaly present.  Cardiovascular: Normal rate, regular rhythm, normal heart sounds and intact distal pulses.  Exam reveals no gallop and no friction rub.   No murmur heard. Pulmonary/Chest: Effort normal and breath sounds normal. No respiratory distress. He has no wheezes. He has no rales. He exhibits no tenderness.  Musculoskeletal:       4+ edema in the right leg and 2+ edema in the left leg, no tenderness   Lymphadenopathy:    He has no cervical adenopathy.          Assessment & Plan:  Stable HTN. We refilled meds. Get labs. I encouraged him to make any effort to lose weight

## 2011-12-13 ENCOUNTER — Encounter: Payer: Self-pay | Admitting: Family Medicine

## 2011-12-13 LAB — CBC WITH DIFFERENTIAL/PLATELET
Basophils Absolute: 0.1 10*3/uL (ref 0.0–0.1)
Eosinophils Absolute: 0.2 10*3/uL (ref 0.0–0.7)
Eosinophils Relative: 2.5 % (ref 0.0–5.0)
MCHC: 33.4 g/dL (ref 30.0–36.0)
MCV: 86.6 fl (ref 78.0–100.0)
Monocytes Absolute: 0.5 10*3/uL (ref 0.1–1.0)
Neutrophils Relative %: 71.7 % (ref 43.0–77.0)
Platelets: 248 10*3/uL (ref 150.0–400.0)
RDW: 14.8 % — ABNORMAL HIGH (ref 11.5–14.6)
WBC: 9.3 10*3/uL (ref 4.5–10.5)

## 2011-12-13 LAB — HEPATIC FUNCTION PANEL
Albumin: 4.5 g/dL (ref 3.5–5.2)
Alkaline Phosphatase: 80 U/L (ref 39–117)
Total Protein: 7.7 g/dL (ref 6.0–8.3)

## 2011-12-13 LAB — VITAMIN B12: Vitamin B-12: 214 pg/mL (ref 211–911)

## 2011-12-13 LAB — BASIC METABOLIC PANEL
CO2: 28 mEq/L (ref 19–32)
Calcium: 9.8 mg/dL (ref 8.4–10.5)
Creatinine, Ser: 0.9 mg/dL (ref 0.4–1.5)
Glucose, Bld: 111 mg/dL — ABNORMAL HIGH (ref 70–99)
Sodium: 142 mEq/L (ref 135–145)

## 2011-12-14 ENCOUNTER — Other Ambulatory Visit: Payer: Self-pay | Admitting: Family Medicine

## 2011-12-20 NOTE — Progress Notes (Signed)
Quick Note:  Pt aware ______ 

## 2012-10-09 ENCOUNTER — Encounter: Payer: Self-pay | Admitting: Family

## 2012-10-09 ENCOUNTER — Ambulatory Visit (INDEPENDENT_AMBULATORY_CARE_PROVIDER_SITE_OTHER): Payer: Medicare Other | Admitting: Family

## 2012-10-09 VITALS — BP 150/70 | HR 83 | Temp 98.6°F | Wt >= 6400 oz

## 2012-10-09 DIAGNOSIS — J069 Acute upper respiratory infection, unspecified: Secondary | ICD-10-CM | POA: Diagnosis not present

## 2012-10-09 DIAGNOSIS — J40 Bronchitis, not specified as acute or chronic: Secondary | ICD-10-CM

## 2012-10-09 DIAGNOSIS — R059 Cough, unspecified: Secondary | ICD-10-CM | POA: Diagnosis not present

## 2012-10-09 DIAGNOSIS — R05 Cough: Secondary | ICD-10-CM

## 2012-10-09 MED ORDER — METHYLPREDNISOLONE 4 MG PO KIT: PACK | ORAL | Status: AC

## 2012-10-09 MED ORDER — AZITHROMYCIN 250 MG PO TABS: ORAL_TABLET | ORAL | Status: DC

## 2012-10-09 NOTE — Progress Notes (Signed)
Subjective:    Patient ID: [name], male    DOB: 07/17/1946, 66 y.o.   MRN: [id number]  HPI 66 year old white male, nonsmoker, patient of Dr. [name] is in with complaints of cough, congestion, fever, sore throat x3 days. He's been taking over-the-counter Coricidin HBP with no relief. Patient has a history of non-Hodgkin's lymphoma and is currently seeing the oncologist.   Review of Systems  Constitutional: Positive for fever, chills and fatigue.  HENT: Positive for congestion, sneezing, postnasal drip and sinus pressure.   Respiratory: Positive for cough.   Cardiovascular: Negative.   Musculoskeletal: Negative.   Skin: Negative.   Neurological: Negative.   Hematological: Negative.   Psychiatric/Behavioral: Negative.    Past Medical History  Diagnosis Date  . Hypertension   . Diverticulitis   . Headache   . Lymphoma, non Hodgkin's     sees Dr. [name]     History   Social History  . Marital Status: Legally Separated    Spouse Name: N/A    Number of Children: N/A  . Years of Education: N/A   Occupational History  . Not on file.   Social History Main Topics  . Smoking status: Former Smoker    Types: Cigarettes  . Smokeless tobacco: Never Used     Comment: quit 13 years ago  . Alcohol Use: Yes     Comment: once every 2 or 3 months  . Drug Use: No  . Sexually Active: Not on file   Other Topics Concern  . Not on file   Social History Narrative  . No narrative on file    Past Surgical History  Procedure Date  . Hernia repair     umblical  . Incarcerated hernia     vental 11/19/08 Dr. [name]    Family History  Problem Relation Age of Onset  . Diabetes    . Hypertension      No Known Allergies  Current Outpatient Prescriptions on File Prior to Visit  Medication Sig Dispense Refill  . furosemide (LASIX) 40 MG tablet Take 1 tablet (40 mg total) by mouth 2 (two) times daily.  60 tablet  11  . metoprolol (TOPROL-XL) 200 MG 24 hr tablet Take 1  tablet (200 mg total) by mouth daily.  30 tablet  11  . olmesartan (BENICAR) 40 MG tablet Take 1 tablet (40 mg total) by mouth daily.  30 tablet  11  . potassium chloride (KLOR-CON M10) 10 MEQ tablet Take 1 tablet (10 mEq total) by mouth daily.  30 tablet  11    BP 150/70  Pulse 83  Temp 98.6 F (37 C) (Oral)  Wt 401 lb 3.2 oz (181.983 kg)  SpO2 94%chart    Objective:   Physical Exam  Constitutional: He is oriented to person, place, and time. He appears well-developed and well-nourished.  HENT:  Right Ear: External ear normal.  Left Ear: External ear normal.  Nose: Nose normal.  Mouth/Throat: Oropharynx is clear and moist.       Sinus tenderness to palpation of the right maxillary sinus.  Neck: Normal range of motion. Neck supple.  Cardiovascular: Normal rate, regular rhythm and normal heart sounds.   Pulmonary/Chest: Effort normal. He has wheezes.       Very mild wheezing noted bilaterally.  Abdominal: Soft. Bowel sounds are normal.  Neurological: He is alert and oriented to person, place, and time.  Skin: Skin is warm and dry.  Psychiatric: He has a normal mood and  affect.          Assessment & Plan:  Assessment: Upper respiratory infection, bronchitis, history of non-Hodgkin's lymphoma  Plan: Z-Pak as directed. Medrol Dosepak as directed. Rest. Drink plenty of fluids. Patient call the office if symptoms worsen or persist. Recheck as scheduled, and as needed.

## 2012-10-09 NOTE — Patient Instructions (Signed)

## 2012-12-12 ENCOUNTER — Other Ambulatory Visit: Payer: Self-pay | Admitting: Family Medicine

## 2012-12-12 NOTE — Telephone Encounter (Signed)
Call in 30 days of each only. He needs an OV

## 2012-12-12 NOTE — Telephone Encounter (Signed)
Can we refill these? 

## 2013-01-10 ENCOUNTER — Telehealth: Payer: Self-pay | Admitting: Family Medicine

## 2013-01-10 MED ORDER — METOPROLOL SUCCINATE ER 200 MG PO TB24: ORAL_TABLET | ORAL | Status: DC

## 2013-01-10 MED ORDER — POTASSIUM CHLORIDE CRYS ER 10 MEQ PO TBCR: EXTENDED_RELEASE_TABLET | ORAL | Status: DC

## 2013-01-10 MED ORDER — OLMESARTAN MEDOXOMIL 40 MG PO TABS: ORAL_TABLET | ORAL | Status: DC

## 2013-01-10 NOTE — Telephone Encounter (Signed)
Rx sent to pharmacy for 30 day supply.

## 2013-01-10 NOTE — Telephone Encounter (Signed)
Pt needs refills on benicar 40 mg,klor-con 10 meq and metoprolol 200 mg call into [facility]. Pt has cpx sch for 02-13-13 10am

## 2013-02-13 ENCOUNTER — Encounter: Payer: Medicare Other | Admitting: Family Medicine

## 2013-02-14 ENCOUNTER — Ambulatory Visit (INDEPENDENT_AMBULATORY_CARE_PROVIDER_SITE_OTHER): Payer: Medicare Other | Admitting: Family Medicine

## 2013-02-14 ENCOUNTER — Encounter: Payer: Self-pay | Admitting: Family Medicine

## 2013-02-14 VITALS — BP 156/94 | HR 87 | Temp 98.5°F | Ht 76.5 in | Wt 395.0 lb

## 2013-02-14 DIAGNOSIS — I10 Essential (primary) hypertension: Secondary | ICD-10-CM

## 2013-02-14 DIAGNOSIS — R609 Edema, unspecified: Secondary | ICD-10-CM | POA: Diagnosis not present

## 2013-02-14 DIAGNOSIS — C8589 Other specified types of non-Hodgkin lymphoma, extranodal and solid organ sites: Secondary | ICD-10-CM | POA: Diagnosis not present

## 2013-02-14 DIAGNOSIS — N401 Enlarged prostate with lower urinary tract symptoms: Secondary | ICD-10-CM

## 2013-02-14 DIAGNOSIS — N139 Obstructive and reflux uropathy, unspecified: Secondary | ICD-10-CM

## 2013-02-14 DIAGNOSIS — N138 Other obstructive and reflux uropathy: Secondary | ICD-10-CM | POA: Diagnosis not present

## 2013-02-14 LAB — BASIC METABOLIC PANEL
BUN: 16 mg/dL (ref 6–23)
CO2: 30 mEq/L (ref 19–32)
Calcium: 9.4 mg/dL (ref 8.4–10.5)
Creatinine, Ser: 0.9 mg/dL (ref 0.4–1.5)
GFR: 95.63 mL/min (ref >60.00)
Glucose, Bld: 131 mg/dL — ABNORMAL HIGH (ref 70–99)
Sodium: 135 mEq/L (ref 135–145)

## 2013-02-14 LAB — HEPATIC FUNCTION PANEL
ALT: 48 U/L (ref 0–53)
Albumin: 4.1 g/dL (ref 3.5–5.2)
Bilirubin, Direct: 0.1 mg/dL (ref 0.0–0.3)
Total Protein: 7 g/dL (ref 6.0–8.3)

## 2013-02-14 LAB — CBC WITH DIFFERENTIAL/PLATELET
Basophils Absolute: 0 10*3/uL (ref 0.0–0.1)
HCT: 46.5 % (ref 39.0–52.0)
Lymphs Abs: 2.2 10*3/uL (ref 0.7–4.0)
MCV: 84.3 fl (ref 78.0–100.0)
Monocytes Absolute: 0.5 10*3/uL (ref 0.1–1.0)
Monocytes Relative: 6.2 % (ref 3.0–12.0)
Neutrophils Relative %: 65.1 % (ref 43.0–77.0)
Platelets: 239 10*3/uL (ref 150.0–400.0)
RDW: 14.4 % (ref 11.5–14.6)
WBC: 8.7 10*3/uL (ref 4.5–10.5)

## 2013-02-14 LAB — POCT URINALYSIS DIPSTICK
Glucose, UA: NEGATIVE
Nitrite, UA: NEGATIVE
Protein, UA: NEGATIVE
Spec Grav, UA: 1.015
Urobilinogen, UA: 0.2

## 2013-02-14 LAB — LIPID PANEL
Cholesterol: 212 mg/dL — ABNORMAL HIGH (ref 0–200)
HDL: 37.9 mg/dL — ABNORMAL LOW (ref >39.00)
Triglycerides: 154 mg/dL — ABNORMAL HIGH (ref 0.0–149.0)

## 2013-02-14 LAB — TSH: TSH: 2.22 u[IU]/mL (ref 0.35–5.50)

## 2013-02-14 LAB — LDL CHOLESTEROL, DIRECT: Direct LDL: 164.8 mg/dL

## 2013-02-14 MED ORDER — POTASSIUM CHLORIDE CRYS ER 10 MEQ PO TBCR: EXTENDED_RELEASE_TABLET | ORAL | Status: DC

## 2013-02-14 MED ORDER — METOPROLOL SUCCINATE ER 200 MG PO TB24: ORAL_TABLET | ORAL | Status: DC

## 2013-02-14 MED ORDER — SILDENAFIL CITRATE 100 MG PO TABS: 50.0000 mg | ORAL_TABLET | Freq: Every day | ORAL | Status: DC | PRN

## 2013-02-14 MED ORDER — OLMESARTAN MEDOXOMIL 40 MG PO TABS: ORAL_TABLET | ORAL | Status: DC

## 2013-02-14 MED ORDER — FUROSEMIDE 40 MG PO TABS: 40.0000 mg | ORAL_TABLET | Freq: Every day | ORAL | Status: DC

## 2013-02-14 NOTE — Progress Notes (Signed)
  Subjective:    Patient ID: [name], male    DOB: July 09, 1946, 67 y.o.   MRN: [id number]  HPI 67 yr old male for a cpx. He feels well except for some chronic fatigue. He has put on some more weight, and of course this is a strain on his body. He has not seen Dr. [name] in over a year.    Review of Systems  Constitutional: Positive for fatigue. Negative for fever, chills, diaphoresis, activity change and appetite change.  HENT: Negative.   Eyes: Negative.   Respiratory: Negative.   Cardiovascular: Negative.   Gastrointestinal: Negative.   Genitourinary: Negative.   Musculoskeletal: Negative.   Skin: Negative.   Neurological: Negative.   Psychiatric/Behavioral: Negative.        Objective:   Physical Exam  Constitutional: He is oriented to person, place, and time. No distress.  Morbidly obese   HENT:  Head: Normocephalic and atraumatic.  Right Ear: External ear normal.  Left Ear: External ear normal.  Nose: Nose normal.  Mouth/Throat: Oropharynx is clear and moist. No oropharyngeal exudate.  Eyes: Conjunctivae and EOM are normal. Pupils are equal, round, and reactive to light. Right eye exhibits no discharge. Left eye exhibits no discharge. No scleral icterus.  Neck: Neck supple. No JVD present. No tracheal deviation present. No thyromegaly present.  Cardiovascular: Normal rate, regular rhythm, normal heart sounds and intact distal pulses.  Exam reveals no gallop and no friction rub.   No murmur heard. EKG normal except first degree AVB   Pulmonary/Chest: Effort normal and breath sounds normal. No respiratory distress. He has no wheezes. He has no rales. He exhibits no tenderness.  Abdominal: Soft. Bowel sounds are normal. He exhibits no distension and no mass. There is no tenderness. There is no rebound and no guarding.  Genitourinary: Rectum normal, prostate normal and penis normal. Guaiac negative stool. No penile tenderness.  Musculoskeletal: Normal range of  motion. He exhibits no edema and no tenderness.  Lymphadenopathy:    He has no cervical adenopathy.  Neurological: He is alert and oriented to person, place, and time. He has normal reflexes. No cranial nerve deficit. He exhibits normal muscle tone. Coordination normal.  Skin: Skin is warm and dry. No rash noted. He is not diaphoretic. No erythema. No pallor.  Psychiatric: He has a normal mood and affect. His behavior is normal. Judgment and thought content normal.          Assessment & Plan:  Well exam. Get fasting labs. He needs to lose a lot of weight and we discussed diet and exercise. He will contact the Oncology office. He has never had a colonoscopy and he strongly declines to get one.

## 2013-02-19 MED ORDER — METFORMIN HCL 500 MG PO TABS: 500.0000 mg | ORAL_TABLET | Freq: Two times a day (BID) | ORAL | Status: DC

## 2013-02-19 NOTE — Addendum Note (Signed)
Addended by: Aggie Hacker A on: 02/19/2013 02:05 PM   Modules accepted: Orders

## 2013-02-19 NOTE — Progress Notes (Signed)
Quick Note:  I spoke with pt and sent script e-scribe. ______ 

## 2013-02-22 ENCOUNTER — Telehealth: Payer: Self-pay | Admitting: Family Medicine

## 2013-02-22 NOTE — Telephone Encounter (Signed)
Patient Information:  Caller Name: Hagop  Phone: [phone]  Patient: [name], [name]  Gender: Male  DOB: 1946/08/14  Age: 67 Years  PCP: Alysia Penna [facility])  Office Follow Up:  Does the office need to follow up with this patient?: No  Instructions For The Office: N/A  RN Note:  Pt had problems breathing while coming out of bathroom on 5-9.  Pt started Metformin on 5-8. Pt has no Glucometer at home to check blood sugar.  Pt's breathing improved at time of triage.  Pt also has a chronic problem w/ Chest tightness per Pt. Pt is over 400lbs.  Pt has current chest tightness upon taking deep breath.  Pt had normal EKG on 5-2. Pt would like to know if Metformin could have caused SOB.  Per Health Education in [facility], SOB is not a common or serious side effect of Metformin. Chest tightness improved after triage.  Advised Pt to call back if symptoms continued, worsen or call 911 if chest pain was constant longer than 5 min.  Pt verbalized understanding. Pt will continue taking Metformin as prescribed.  Symptoms  Reason For Call & Symptoms: ER CALL. SOB after starting Metformin  Reviewed Health History In EMR: N/A  Reviewed Medications In EMR: N/A  Reviewed Allergies In EMR: N/A  Reviewed Surgeries / Procedures: N/A  Date of Onset of Symptoms: 02/22/2013  Guideline(s) Used:  Breathing Difficulty  Chest Pain  Disposition Per Guideline:   Home Care  Reason For Disposition Reached:   Intermittent mild chest pain lasting a few seconds each time  Advice Given:  Call Back If:  Severe chest pain  Constant chest pain lasting longer than 5 minutes  Difficulty breathing  Patient Will Follow Care Advice:  YES

## 2013-03-18 ENCOUNTER — Encounter: Payer: Self-pay | Admitting: Family Medicine

## 2013-03-18 ENCOUNTER — Ambulatory Visit (INDEPENDENT_AMBULATORY_CARE_PROVIDER_SITE_OTHER): Payer: Medicare Other | Admitting: Family Medicine

## 2013-03-18 VITALS — BP 158/82 | HR 85 | Temp 98.4°F | Wt 397.0 lb

## 2013-03-18 DIAGNOSIS — E119 Type 2 diabetes mellitus without complications: Secondary | ICD-10-CM

## 2013-03-18 NOTE — Progress Notes (Signed)
  Subjective:    Patient ID: [name], male    DOB: [birth year], 67 y.o.   MRN: [id number]  HPI Here with his wife to follow up newly diagnosed diabetes. He is taking Metformin and he feels well. He has reduced his intake of sugars and carbs. He is still not very active however.    Review of Systems  Constitutional: Negative.   Respiratory: Negative.   Cardiovascular: Negative.        Objective:   Physical Exam  Constitutional: He appears well-developed and well-nourished.  Cardiovascular: Normal rate, regular rhythm, normal heart sounds and intact distal pulses.   Pulmonary/Chest: Effort normal and breath sounds normal.          Assessment & Plan:  We discussed changing his diet and his need to exercise and lose weight. Get a baseline A1c today. Refer to Nutrition.

## 2013-03-19 NOTE — Progress Notes (Signed)
Quick Note:  I spoke with pt ______ 

## 2013-04-02 ENCOUNTER — Encounter: Payer: Medicare Other | Attending: Family Medicine | Admitting: *Deleted

## 2013-04-02 DIAGNOSIS — Z713 Dietary counseling and surveillance: Secondary | ICD-10-CM | POA: Insufficient documentation

## 2013-04-02 DIAGNOSIS — E119 Type 2 diabetes mellitus without complications: Secondary | ICD-10-CM | POA: Insufficient documentation

## 2013-04-08 ENCOUNTER — Encounter: Payer: Self-pay | Admitting: *Deleted

## 2013-04-08 NOTE — Progress Notes (Signed)
Patient was seen on 04/02/2013 for the first of a series of three diabetes self-management courses at [facility]. Patient's most recent A1c was 6.5 % on 03/19/2013 The following learning objectives were met by the patient during this course:   Defines the role of glucose and insulin  Identifies type of diabetes and pathophysiology  Defines the diagnostic criteria for diabetes and prediabetes  States the risk factors for Type 2 Diabetes  States the symptoms of Type 2 Diabetes  Defines Type 2 Diabetes treatment goals  Defines Type 2 Diabetes treatment options  States the rationale for glucose monitoring  Identifies A1C, glucose targets, and testing times  Identifies proper sharps disposal  Defines the purpose of a diabetes food plan  Identifies carbohydrate food groups  Defines effects of carbohydrate foods on glucose levels  Identifies carbohydrate choices/grams/food labels  States benefits of physical activity and effect on glucose  Review of suggested activity guidelines  Handouts given during class include:  Type 2 Diabetes: Basics Book  My [place] and Activity Log   Follow-Up Plan: Core Class 2

## 2013-04-23 ENCOUNTER — Encounter: Payer: Medicare Other | Attending: Family Medicine | Admitting: *Deleted

## 2013-04-23 DIAGNOSIS — Z713 Dietary counseling and surveillance: Secondary | ICD-10-CM | POA: Insufficient documentation

## 2013-04-23 DIAGNOSIS — E119 Type 2 diabetes mellitus without complications: Secondary | ICD-10-CM | POA: Diagnosis not present

## 2013-04-24 NOTE — Progress Notes (Signed)
  Patient was seen on 04/23/13 for the second of a series of three diabetes self-management courses at the [facility]. The following learning objectives were met by the patient during this course:   Explain basic nutrition maintenance and quality assurance  Describe causes, symptoms and treatment of hypoglycemia and hyperglycemia  Explain how to manage diabetes during illness  Describe the importance of good nutrition for health and healthy eating strategies  List strategies to follow meal plan when dining out  Describe the effects of alcohol on glucose and how to use it safely  Describe problem solving skills for day-to-day glucose challenges  Describe strategies to use when treatment plan needs to change  Identify important factors involved in successful weight loss  Describe ways to remain physically active  Describe the impact of regular activity on insulin resistance    Handouts given in class:  Refrigerator magnet for Sick Day Guidelines  [facility] Oral medication/insulin handout  Follow-Up Plan: Patient will attend the final class of the ADA Diabetes Self-Care Education.

## 2013-04-25 ENCOUNTER — Other Ambulatory Visit: Payer: Self-pay

## 2013-05-06 ENCOUNTER — Ambulatory Visit: Payer: Medicare Other | Admitting: Family Medicine

## 2013-05-07 DIAGNOSIS — E119 Type 2 diabetes mellitus without complications: Secondary | ICD-10-CM

## 2013-05-08 NOTE — Progress Notes (Signed)
Patient was seen on 05/07/13 for the third of a series of three diabetes self-management courses at the [facility]. The following learning objectives were met by the patient during this course:    Describe how diabetes changes over time   Identify diabetes complications and ways to prevent them   Describe strategies that can promote heart health including lowering blood pressure and cholesterol   Describe strategies to lower dietary fat and sodium in the diet   Identify physical activities that benefit cardiovascular health   Describe role of stress on blood glucose and develop strategies to address psychosocial issues   Evaluate success in meeting personal goal   Describe the belief that they can live successfully with diabetes day to day   Establish 2-3 goals that they will plan to diligently work on until they return for the free 55-month follow-up visit  The following handouts were given in class:  Goal setting handout  Class evaluation form  Low-sodium seasoning tips  Stress management handout  Your patient has established the following 4 month goals for diabetes self-care:  Increase physical activity at least 3 days/week  Take diabetes medications as scheduled  Your patient has identified these potential barriers to change:  Handling stress  Making time for exercise  Your patient has identified their diabetes self-care support plan as:  family   Follow-Up Plan: Patient was offered a 4 month follow-up visit for diabetes self-management education.

## 2013-08-27 ENCOUNTER — Emergency Department (HOSPITAL_COMMUNITY): Payer: Medicare Other

## 2013-08-27 ENCOUNTER — Encounter (HOSPITAL_COMMUNITY): Payer: Self-pay | Admitting: Emergency Medicine

## 2013-08-27 ENCOUNTER — Inpatient Hospital Stay (HOSPITAL_COMMUNITY)
Admission: EM | Admit: 2013-08-27 | Discharge: 2013-08-31 | DRG: 286 | Disposition: A | Discharge status: Home / Self Care | Payer: Medicare Other | Attending: Internal Medicine | Admitting: Internal Medicine

## 2013-08-27 DIAGNOSIS — I509 Heart failure, unspecified: Secondary | ICD-10-CM | POA: Diagnosis not present

## 2013-08-27 DIAGNOSIS — Z833 Family history of diabetes mellitus: Secondary | ICD-10-CM | POA: Diagnosis not present

## 2013-08-27 DIAGNOSIS — E119 Type 2 diabetes mellitus without complications: Secondary | ICD-10-CM | POA: Diagnosis present

## 2013-08-27 DIAGNOSIS — I10 Essential (primary) hypertension: Secondary | ICD-10-CM | POA: Diagnosis not present

## 2013-08-27 DIAGNOSIS — Z6841 Body Mass Index (BMI) 40.0 and over, adult: Secondary | ICD-10-CM | POA: Diagnosis not present

## 2013-08-27 DIAGNOSIS — R42 Dizziness and giddiness: Secondary | ICD-10-CM

## 2013-08-27 DIAGNOSIS — R1900 Intra-abdominal and pelvic swelling, mass and lump, unspecified site: Secondary | ICD-10-CM

## 2013-08-27 DIAGNOSIS — I428 Other cardiomyopathies: Secondary | ICD-10-CM | POA: Diagnosis present

## 2013-08-27 DIAGNOSIS — J9600 Acute respiratory failure, unspecified whether with hypoxia or hypercapnia: Secondary | ICD-10-CM | POA: Diagnosis present

## 2013-08-27 DIAGNOSIS — I517 Cardiomegaly: Secondary | ICD-10-CM | POA: Diagnosis not present

## 2013-08-27 DIAGNOSIS — R0602 Shortness of breath: Secondary | ICD-10-CM | POA: Diagnosis not present

## 2013-08-27 DIAGNOSIS — Z8249 Family history of ischemic heart disease and other diseases of the circulatory system: Secondary | ICD-10-CM | POA: Diagnosis not present

## 2013-08-27 DIAGNOSIS — C801 Malignant (primary) neoplasm, unspecified: Secondary | ICD-10-CM

## 2013-08-27 DIAGNOSIS — C8589 Other specified types of non-Hodgkin lymphoma, extranodal and solid organ sites: Secondary | ICD-10-CM | POA: Diagnosis present

## 2013-08-27 DIAGNOSIS — Z9189 Other specified personal risk factors, not elsewhere classified: Secondary | ICD-10-CM

## 2013-08-27 DIAGNOSIS — R51 Headache: Secondary | ICD-10-CM

## 2013-08-27 DIAGNOSIS — G4733 Obstructive sleep apnea (adult) (pediatric): Secondary | ICD-10-CM | POA: Diagnosis present

## 2013-08-27 DIAGNOSIS — I872 Venous insufficiency (chronic) (peripheral): Secondary | ICD-10-CM | POA: Diagnosis present

## 2013-08-27 DIAGNOSIS — Z823 Family history of stroke: Secondary | ICD-10-CM

## 2013-08-27 DIAGNOSIS — I5023 Acute on chronic systolic (congestive) heart failure: Principal | ICD-10-CM | POA: Diagnosis present

## 2013-08-27 DIAGNOSIS — N200 Calculus of kidney: Secondary | ICD-10-CM

## 2013-08-27 DIAGNOSIS — A0839 Other viral enteritis: Secondary | ICD-10-CM

## 2013-08-27 DIAGNOSIS — Z7982 Long term (current) use of aspirin: Secondary | ICD-10-CM

## 2013-08-27 DIAGNOSIS — Z79899 Other long term (current) drug therapy: Secondary | ICD-10-CM

## 2013-08-27 DIAGNOSIS — Z87891 Personal history of nicotine dependence: Secondary | ICD-10-CM | POA: Diagnosis not present

## 2013-08-27 DIAGNOSIS — R609 Edema, unspecified: Secondary | ICD-10-CM

## 2013-08-27 DIAGNOSIS — E785 Hyperlipidemia, unspecified: Secondary | ICD-10-CM | POA: Diagnosis present

## 2013-08-27 DIAGNOSIS — Z8719 Personal history of other diseases of the digestive system: Secondary | ICD-10-CM

## 2013-08-27 DIAGNOSIS — C8590 Non-Hodgkin lymphoma, unspecified, unspecified site: Secondary | ICD-10-CM | POA: Diagnosis present

## 2013-08-27 DIAGNOSIS — I5021 Acute systolic (congestive) heart failure: Secondary | ICD-10-CM | POA: Diagnosis present

## 2013-08-27 DIAGNOSIS — J209 Acute bronchitis, unspecified: Secondary | ICD-10-CM

## 2013-08-27 HISTORY — DX: Hyperlipidemia, unspecified: E78.5

## 2013-08-27 HISTORY — DX: Morbid (severe) obesity due to excess calories: E66.01

## 2013-08-27 LAB — CBC
HCT: 44.2 % (ref 39.0–52.0)
Hemoglobin: 14.8 g/dL (ref 13.0–17.0)
MCH: 29.1 pg (ref 26.0–34.0)
MCV: 87 fL (ref 78.0–100.0)
RBC: 5.08 MIL/uL (ref 4.22–5.81)

## 2013-08-27 LAB — COMPREHENSIVE METABOLIC PANEL
Albumin: 3.6 g/dL (ref 3.5–5.2)
Alkaline Phosphatase: 70 U/L (ref 39–117)
BUN: 21 mg/dL (ref 6–23)
CO2: 25 mEq/L (ref 19–32)
Creatinine, Ser: 0.81 mg/dL (ref 0.50–1.35)
GFR calc Af Amer: >90 mL/min (ref >90)
GFR calc non Af Amer: 90 mL/min — ABNORMAL LOW (ref >90)
Glucose, Bld: 129 mg/dL — ABNORMAL HIGH (ref 70–99)
Potassium: 4.6 mEq/L (ref 3.5–5.1)
Total Protein: 7 g/dL (ref 6.0–8.3)

## 2013-08-27 LAB — CBC WITH DIFFERENTIAL/PLATELET
Basophils Relative: 0 % (ref 0–1)
Eosinophils Absolute: 0.3 10*3/uL (ref 0.0–0.7)
Eosinophils Relative: 3 % (ref 0–5)
HCT: 44.9 % (ref 39.0–52.0)
Hemoglobin: 14.8 g/dL (ref 13.0–17.0)
Lymphs Abs: 1.4 10*3/uL (ref 0.7–4.0)
MCH: 28.6 pg (ref 26.0–34.0)
MCHC: 33 g/dL (ref 30.0–36.0)
MCV: 86.7 fL (ref 78.0–100.0)
Monocytes Absolute: 0.6 10*3/uL (ref 0.1–1.0)
Monocytes Relative: 6 % (ref 3–12)
Neutro Abs: 7.6 10*3/uL (ref 1.7–7.7)
Neutrophils Relative %: 77 % (ref 43–77)
RBC: 5.18 MIL/uL (ref 4.22–5.81)

## 2013-08-27 LAB — PRO B NATRIURETIC PEPTIDE: Pro B Natriuretic peptide (BNP): 1406 pg/mL — ABNORMAL HIGH (ref 0–125)

## 2013-08-27 LAB — POCT I-STAT 3, VENOUS BLOOD GAS (G3P V)
Acid-Base Excess: 4 mmol/L — ABNORMAL HIGH (ref 0.0–2.0)
O2 Saturation: 99 %
pH, Ven: 7.406 — ABNORMAL HIGH (ref 7.250–7.300)
pO2, Ven: 156 mmHg — ABNORMAL HIGH (ref 30.0–45.0)

## 2013-08-27 LAB — POCT I-STAT TROPONIN I: Troponin i, poc: 0 ng/mL (ref 0.00–0.08)

## 2013-08-27 LAB — GLUCOSE, CAPILLARY: Glucose-Capillary: 114 mg/dL — ABNORMAL HIGH (ref 70–99)

## 2013-08-27 MED ORDER — ONDANSETRON HCL 4 MG/2ML IJ SOLN: 4.0000 mg | Freq: Four times a day (QID) | INTRAMUSCULAR | Status: DC | PRN

## 2013-08-27 MED ORDER — ENOXAPARIN SODIUM 100 MG/ML ~~LOC~~ SOLN
85.0000 mg | SUBCUTANEOUS | Status: DC
  Administered 2013-08-27 – 2013-08-29 (×3): 85 mg via SUBCUTANEOUS
  Filled 2013-08-27 (×5): qty 1

## 2013-08-27 MED ORDER — SODIUM CHLORIDE 0.9 % IJ SOLN
3.0000 mL | Freq: Two times a day (BID) | INTRAMUSCULAR | Status: DC
  Administered 2013-08-27: 3 mL via INTRAVENOUS

## 2013-08-27 MED ORDER — ASPIRIN 325 MG PO TABS
325.0000 mg | ORAL_TABLET | Freq: Every day | ORAL | Status: DC
  Administered 2013-08-27 – 2013-08-30 (×4): 325 mg via ORAL
  Filled 2013-08-27 (×5): qty 1

## 2013-08-27 MED ORDER — DOCUSATE SODIUM 100 MG PO CAPS
100.0000 mg | ORAL_CAPSULE | Freq: Two times a day (BID) | ORAL | Status: DC
  Administered 2013-08-27 – 2013-08-31 (×9): 100 mg via ORAL
  Filled 2013-08-27 (×10): qty 1

## 2013-08-27 MED ORDER — ADULT MULTIVITAMIN W/MINERALS CH
1.0000 | ORAL_TABLET | Freq: Every day | ORAL | Status: DC
  Administered 2013-08-27 – 2013-08-31 (×5): 1 via ORAL
  Filled 2013-08-27 (×5): qty 1

## 2013-08-27 MED ORDER — INSULIN ASPART 100 UNIT/ML ~~LOC~~ SOLN: 0.0000 [IU] | Freq: Every day | SUBCUTANEOUS | Status: DC

## 2013-08-27 MED ORDER — IRBESARTAN 300 MG PO TABS
300.0000 mg | ORAL_TABLET | Freq: Every day | ORAL | Status: DC
  Administered 2013-08-27 – 2013-08-31 (×5): 300 mg via ORAL
  Filled 2013-08-27 (×5): qty 1

## 2013-08-27 MED ORDER — SODIUM CHLORIDE 0.9 % IJ SOLN: 3.0000 mL | INTRAMUSCULAR | Status: DC | PRN

## 2013-08-27 MED ORDER — FUROSEMIDE 10 MG/ML IJ SOLN
40.0000 mg | Freq: Once | INTRAMUSCULAR | Status: AC
  Administered 2013-08-27: 40 mg via INTRAVENOUS
  Filled 2013-08-27: qty 4

## 2013-08-27 MED ORDER — ACETAMINOPHEN 650 MG RE SUPP: 650.0000 mg | Freq: Four times a day (QID) | RECTAL | Status: DC | PRN

## 2013-08-27 MED ORDER — SODIUM CHLORIDE 0.9 % IJ SOLN
3.0000 mL | Freq: Two times a day (BID) | INTRAMUSCULAR | Status: DC
  Administered 2013-08-27 – 2013-08-29 (×5): 3 mL via INTRAVENOUS

## 2013-08-27 MED ORDER — NITROGLYCERIN IN D5W 200-5 MCG/ML-% IV SOLN
2.0000 ug/min | Freq: Once | INTRAVENOUS | Status: AC
  Administered 2013-08-27: 5 ug/min via INTRAVENOUS
  Filled 2013-08-27: qty 250

## 2013-08-27 MED ORDER — ASPIRIN 81 MG PO CHEW
324.0000 mg | CHEWABLE_TABLET | Freq: Once | ORAL | Status: DC
  Filled 2013-08-27: qty 4

## 2013-08-27 MED ORDER — SODIUM CHLORIDE 0.9 % IV SOLN: 250.0000 mL | INTRAVENOUS | Status: DC | PRN

## 2013-08-27 MED ORDER — FUROSEMIDE 10 MG/ML IJ SOLN
40.0000 mg | Freq: Every day | INTRAMUSCULAR | Status: DC
  Filled 2013-08-27 (×2): qty 4

## 2013-08-27 MED ORDER — ONDANSETRON HCL 4 MG PO TABS: 4.0000 mg | ORAL_TABLET | Freq: Four times a day (QID) | ORAL | Status: DC | PRN

## 2013-08-27 MED ORDER — OXYCODONE HCL 5 MG PO TABS: 5.0000 mg | ORAL_TABLET | ORAL | Status: DC | PRN

## 2013-08-27 MED ORDER — ALUM & MAG HYDROXIDE-SIMETH 200-200-20 MG/5ML PO SUSP: 30.0000 mL | Freq: Four times a day (QID) | ORAL | Status: DC | PRN

## 2013-08-27 MED ORDER — METOPROLOL TARTRATE 50 MG PO TABS
50.0000 mg | ORAL_TABLET | Freq: Two times a day (BID) | ORAL | Status: DC
  Administered 2013-08-27 – 2013-08-30 (×8): 50 mg via ORAL
  Filled 2013-08-27 (×10): qty 1

## 2013-08-27 MED ORDER — NITROGLYCERIN 2 % TD OINT
0.5000 [in_us] | TOPICAL_OINTMENT | Freq: Four times a day (QID) | TRANSDERMAL | Status: DC
  Administered 2013-08-27 – 2013-08-29 (×8): 0.5 [in_us] via TOPICAL
  Filled 2013-08-27: qty 30

## 2013-08-27 MED ORDER — INSULIN ASPART 100 UNIT/ML ~~LOC~~ SOLN
0.0000 [IU] | Freq: Three times a day (TID) | SUBCUTANEOUS | Status: DC
  Administered 2013-08-28 – 2013-08-30 (×3): 2 [IU] via SUBCUTANEOUS

## 2013-08-27 MED ORDER — ACETAMINOPHEN 325 MG PO TABS
650.0000 mg | ORAL_TABLET | Freq: Four times a day (QID) | ORAL | Status: DC | PRN
  Administered 2013-08-28 – 2013-08-30 (×3): 650 mg via ORAL
  Filled 2013-08-27 (×4): qty 2

## 2013-08-27 NOTE — ED Provider Notes (Signed)
I saw and evaluated the patient, reviewed the resident's note and I agree with the findings and plan.  EKG Interpretation     Ventricular Rate:  89 PR Interval:  241 QRS Duration: 92 QT Interval:  376 QTC Calculation: 457 R Axis:   -18 Text Interpretation:  Sinus rhythm Ventricular premature complex Prolonged PR interval Probable left atrial enlargement Borderline left axis deviation since prior tracing, PR interval prolonged and PVC present              Houston Siren, MD 08/27/13 1849

## 2013-08-27 NOTE — Progress Notes (Signed)
Utilization Review Completed Sommer Spickard J. Roise Emert, RN, BSN, NCM [phone]  

## 2013-08-27 NOTE — ED Provider Notes (Signed)
I saw and evaluated the patient, reviewed the resident's note and I agree with the findings and plan.  EKG Interpretation     Ventricular Rate:  89 PR Interval:  241 QRS Duration: 92 QT Interval:  376 QTC Calculation: 457 R Axis:   -18 Text Interpretation:  Sinus rhythm Ventricular premature complex Prolonged PR interval Probable left atrial enlargement Borderline left axis deviation since prior tracing, PR interval prolonged and PVC present            67 yo male presenting in respiratory distress.  EMS initiated CPAP.  Has bilateral rales on exam, 4+pitting edema RLE and 2+ pitting edema LLE (asymmetry has been previously documented).  Pt feels better on NIPPV.  Severely hypertensive, so plan nitro drip, lasix.  Admission.   CRITICAL CARE Performed by: Artis Delay   Total critical care time: 30  Critical care time was exclusive of separately billable procedures and treating other patients.  Critical care was necessary to treat or prevent imminent or life-threatening deterioration.  Critical care was time spent personally by me on the following activities: development of treatment plan with patient and/or surrogate as well as nursing, discussions with consultants, evaluation of patient's response to treatment, examination of patient, obtaining history from patient or surrogate, ordering and performing treatments and interventions, ordering and review of laboratory studies, ordering and review of radiographic studies, pulse oximetry and re-evaluation of patient's condition.  Clinical Impression: 1. Congestive heart failure (CHF)   2. Acute respiratory failure   3. Edema   4. Malignant hypertension       Houston Siren, MD 08/27/13 [phone]

## 2013-08-27 NOTE — ED Notes (Signed)
Pt transitioned off Bipap per EDP order. Pt tolerating well. Placed on [place] at 2L

## 2013-08-27 NOTE — Progress Notes (Signed)
Notified Dr. Coralyn Pear that patient was here on the floor and pt was stable.

## 2013-08-27 NOTE — H&P (Signed)
[facility] History and Physical  [name] [id number] DOB: 04/26/1946 DOA: 08/27/2013  Referring physician:  PCP: [name], MD  Specialists:   Chief Complaint: Shortness of breath  HPI: [name] is a 67 y.o. male with a past medical history of hypertension, diabetes mellitus, morbid obesity, presenting to the emergency department this morning with complaints of shortness of breath. He states that symptoms started this morning, noting worsening shortness of breath with associated chest discomfort across his chest. He denied prior symptoms in the past. He did not report cough, fevers, chills, syncope, presyncope, nausea, vomiting, abdominal pain or pain elsewhere in his body. Patient denies a cardiac history, and reports never having undergone cardiac workups. He does report being on Lasix therapy for lower extremity swelling. In the emergency room he was found to be in acute respiratory failure, requiring CPAP. He was also found to be hypertensive with a blood pressure of 230/130.  In the emergency room he was started on a nitro drip and administered 40 mg of IV Lasix as symptoms were suspected to be secondary to acute CHF. Chest x-ray showed findings consistent with CHF as BNP came back elevated at 1406. During my catheter she states feeling much better as he is off of supplemental oxygen, denying chest pain, with his blood pressures controlled now. I have spoken with cardiology requesting consultation.                               Review of Systems: The patient denies anorexia, fever, weight loss,, vision loss, decreased hearing, hoarseness, syncope, balance deficits, hemoptysis, abdominal pain, melena, hematochezia, severe indigestion/heartburn, hematuria, incontinence, genital sores, muscle weakness, suspicious skin lesions, transient blindness, difficulty walking, depression, unusual weight change, abnormal bleeding, enlarged lymph nodes, angioedema, and breast  masses.    Past Medical History  Diagnosis Date  . Hypertension   . Diverticulitis   . Headache(784.0)   . Lymphoma, non Hodgkin's     sees Dr. [name]   . Diabetes mellitus without complication    Past Surgical History  Procedure Laterality Date  . Hernia repair      umblical  . Incarcerated hernia      vental 11/19/08 Dr. [name]   Social History:  reports that he has quit smoking. His smoking use included Cigarettes. He smoked 0.00 packs per day. He has never used smokeless tobacco. He reports that he drinks alcohol. He reports that he does not use illicit drugs. Patient is currently retired quit smoking 15 years  No Known Allergies  Family History  Problem Relation Age of Onset  . Diabetes    . Hypertension       Prior to Admission medications   Medication Sig Start Date End Date Taking? Authorizing Provider  aspirin 325 MG tablet Take 650 mg by mouth once.   Yes Historical Provider, MD  aspirin 81 MG tablet Take 81 mg by mouth daily.   Yes Historical Provider, MD  furosemide (LASIX) 40 MG tablet Take 40 mg by mouth.  02/14/13  Yes [name], MD  ibuprofen (ADVIL,MOTRIN) 200 MG tablet Take 200 mg by mouth every 6 (six) hours as needed for headache or mild pain.   Yes Historical Provider, MD  metFORMIN (GLUCOPHAGE) 500 MG tablet Take 1 tablet (500 mg total) by mouth 2 (two) times daily with a meal. 02/19/13  Yes [name], MD  metoprolol (TOPROL-XL) 200 MG 24 hr  tablet take 1 tablet by mouth once daily 02/14/13  Yes [name], MD  Multiple Vitamin (MULTIVITAMIN WITH MINERALS) TABS tablet Take 1 tablet by mouth daily.   Yes Historical Provider, MD  olmesartan (BENICAR) 40 MG tablet Take 40 mg by mouth daily.   Yes Historical Provider, MD  potassium chloride (KLOR-CON M10) 10 MEQ tablet take 1 tablet by mouth once daily 02/14/13  Yes [name], MD  sildenafil (VIAGRA) 100 MG tablet Take 0.5-1 tablets (50-100 mg total) by mouth daily as needed for erectile  dysfunction. 02/14/13  Yes [name], MD   Physical Exam: Filed Vitals:   08/27/13 1129  BP: 135/78  Pulse: 61  Temp: 97.5 F (36.4 C)  Resp: 20     General:  Patient reports feeling much better now, able to breathe easier. Presently denies chest pain. Otherwise well-nourished well-developed no acute distress.  Eyes: Pupils are equal round reactive to light, extraocular movement is intact  Neck: Neck is supple symmetrical, didn't appreciate jugular venous distention  Cardiovascular: Regular rate and rhythm normal S1-S2 no murmurs rubs or gout  Respiratory: normal respiratory effort, he has a few bibasal crackles otherwise clear lungs, no wheezing rhonchi or rales  Abdomen: obese soft nontender nondistended positive bowels  Skin: patient having skin discoloration associated with bilateral extremities  Musculoskeletal: has 2+ bilateral extremity pitting edema  Psychiatric: awake alert and oriented x3  Neurologic: he has a nonfocal neurologic examination, cranial nerve 2 the total grossly intact no alteration to sensation global 5 out of 5 muscle strength  Labs on Admission:  Basic Metabolic Panel:  Recent Labs Lab 08/27/13 0825  NA 138  K 4.6  CL 102  CO2 25  GLUCOSE 129*  BUN 21  CREATININE 0.81  CALCIUM 9.0   Liver Function Tests:  Recent Labs Lab 08/27/13 0825  AST 36  ALT 31  ALKPHOS 70  BILITOT 0.3  PROT 7.0  ALBUMIN 3.6   No results found for this basename: LIPASE, AMYLASE,  in the last 168 hours No results found for this basename: AMMONIA,  in the last 168 hours CBC:  Recent Labs Lab 08/27/13 0825  WBC 9.9  NEUTROABS 7.6  HGB 14.8  HCT 44.9  MCV 86.7  PLT 238   Cardiac Enzymes: No results found for this basename: CKTOTAL, CKMB, CKMBINDEX, TROPONINI,  in the last 168 hours  BNP (last 3 results)  Recent Labs  08/27/13 0825  PROBNP 1406.0*   CBG: No results found for this basename: GLUCAP,  in the last 168 hours  Radiological  Exams on Admission: Dg Chest Portable 1 View  08/27/2013   CLINICAL DATA:  Severe shortness of breath.  EXAM: PORTABLE CHEST - 1 VIEW  COMPARISON:  07/05/2010  FINDINGS: The cardiac silhouette appears enlarged for portable technique. There is marked diffuse pulmonary vascular congestion. There is diffuse bilateral interstitial prominence, with probable early airspace disease at the lung bases bilaterally. No visible pleural effusion by portable technique. No acute osseous abnormality identified.  IMPRESSION: Congestive heart failure pattern.   Electronically Signed   By: Curlene Dolphin M.D.   On: 08/27/2013 08:30    EKG: Independently reviewed. Sinus rhythm no ischemic changes noted  Assessment/Plan Active Problems:   NON-HODGKIN'S LYMPHOMA   CHF (congestive heart failure)   Malignant hypertension   Acute respiratory failure   1. Acute decompensated congestive heart failure. He presents with clinical signs and symptoms consistent with acute CHF. Denies cardiac history. Significant improvement after  the demonstration of IV Lasix in the emergency department. Will continue IV Lasix at 40 mg IV daily, provided nitroglycerin, continue his beta blocker, ARB, cycle cardiac enzymes x3 sets. Will obtain a transthoracic echocardiogram, provide supportive care, monitor ins and outs as well as daily weights. I have discussed case with cardiology who will consult. Obtain an a.m. Fasting lipid panel.  2. Malignant hypertension. Patient presenting with blood pressure of 230/130, started on a nitroglycerin drip in the emergency department. Blood pressures are now improved. Will continue nitro paste, metoprolol 50 mg twice a day, irbesartan 300 mg by mouth daily as well as Lasix 40 mg IV daily. Monitor blood pressures closely today. 3. Type 2 diabetes mellitus. Will discontinue metformin, perform Accu-Cheks q. A.c. And each bedtime with sliding scale coverage. Check an A1c. 4. Acute hypoxemic respiratory failure,  requiring CPAP in the emergency department. Likely secondary to acute decompensated congestive heart failure. Patient now stabilized, significant improvement with IV Lasix. Will monitor closely 5. Diet. Heart healthy diet 6. DVT prophylaxis. Lovenox      Code Status: full code Family Communication: plan discussed with patient's family members present at Disposition Plan: admit to telemetry, cycle cardiac enzymes, obtain transthoracic echocardiogram, await further recommendations from cardiology  Time spent: 70 minutes  Kelvin Cellar [facility] Pager [phone]  If 7PM-7AM, please contact night-coverage [email or web address] Password [facility] 08/27/2013, 12:34 PM

## 2013-08-27 NOTE — Consult Note (Signed)
CARDIOLOGY CONSULT NOTE   Patient ID: [name] MRN: [id number] DOB/AGE: 67-10-1945 67 y.o.  Admit date: 08/27/2013  Primary Physician   [name], MD Primary Cardiologist  none Reason for Consultation  SOB   HPI: [name] is a 67 y.o. male with a history of non-hodgkin's lymphoma (diagnosed in 2008 and in remission), htn, DM, hyperlipidemia, morbid obesity and no prior cardiac history who presented to the ED this morning complaining of SOB. Patient woke up today ~4 am and "just couldn't seem to breath." He has never experienced anything like this before and believed that he must have had carbon monoxide poisoning. The only thing that made it better was walking outside into the cold. He had SOB while sitting and it was worse with exertion. Patient knew something was wrong and called EMS.  He reports being on Lasix therapy for chronic lower extremity swelling and thinks it is a little worse than usual. Patient admits to being very sedentary ever since his chemo in 2010. The most he does on most days is light house work and walking to [employer]. He is not usually short of breath with these activities. Patient has been dieting and has lost >20lbs recently; however, he said that he has maybe "overdone it with salt" the past few days.  He denies chest pain, diaphoresis, palpitations, nausea, vomiting, abdominal pain, cough, recent illnesses,  fevers, chills, dizziness, presyncope or syncope. He smoked 2 ppd for over 50 years and quit 13 years ago. He drinks occasionally and takes no illicit drugs. He has an ECHO in 2009 showing an EF of 60%.  In the emergency room he was found to be in acute respiratory failure, requiring CPAP. He was also found to be hypertensive with a blood pressure of 230/130. He was started on a nitro drip and administered 40 mg of IV Lasix as symptoms were suspected to be secondary to acute CHF. Chest x-ray showed findings consistent with CHF as BNP  came back elevated at 1406.    Past Medical History  Diagnosis Date  . Hypertension   . Diverticulitis   . Headache(784.0)   . Lymphoma, non Hodgkin's     sees Dr. [name]   . Diabetes mellitus without complication   . Morbid obesity   . Hyperlipidemia      Past Surgical History  Procedure Laterality Date  . Hernia repair      umblical  . Incarcerated hernia      vental 11/19/08 Dr. [name]    No Known Allergies  I have reviewed the patient's current medications . aspirin  325 mg Oral Daily  . docusate sodium  100 mg Oral BID  . enoxaparin (LOVENOX) injection  85 mg Subcutaneous Q24H  . [START ON 08/28/2013] furosemide  40 mg Intravenous Daily  . insulin aspart  0-15 Units Subcutaneous TID WC  . insulin aspart  0-5 Units Subcutaneous QHS  . irbesartan  300 mg Oral Daily  . metoprolol tartrate  50 mg Oral BID  . multivitamin with minerals  1 tablet Oral Daily  . nitroGLYCERIN  0.5 inch Topical Q6H  . sodium chloride  3 mL Intravenous Q12H  . sodium chloride  3 mL Intravenous Q12H     sodium chloride, acetaminophen, acetaminophen, alum & mag hydroxide-simeth, ondansetron (ZOFRAN) IV, ondansetron, oxyCODONE, sodium chloride  Prior to Admission medications   Medication Sig Start Date End Date Taking? Authorizing Provider  aspirin 325 MG tablet Take 650 mg  by mouth once.   Yes Historical Provider, MD  aspirin 81 MG tablet Take 81 mg by mouth daily.   Yes Historical Provider, MD  furosemide (LASIX) 40 MG tablet Take 40 mg by mouth.  02/14/13  Yes [name], MD  ibuprofen (ADVIL,MOTRIN) 200 MG tablet Take 200 mg by mouth every 6 (six) hours as needed for headache or mild pain.   Yes Historical Provider, MD  metFORMIN (GLUCOPHAGE) 500 MG tablet Take 1 tablet (500 mg total) by mouth 2 (two) times daily with a meal. 02/19/13  Yes [name], MD  metoprolol (TOPROL-XL) 200 MG 24 hr tablet take 1 tablet by mouth once daily 02/14/13  Yes [name], MD  Multiple Vitamin  (MULTIVITAMIN WITH MINERALS) TABS tablet Take 1 tablet by mouth daily.   Yes Historical Provider, MD  olmesartan (BENICAR) 40 MG tablet Take 40 mg by mouth daily.   Yes Historical Provider, MD  potassium chloride (KLOR-CON M10) 10 MEQ tablet take 1 tablet by mouth once daily 02/14/13  Yes [name], MD  sildenafil (VIAGRA) 100 MG tablet Take 0.5-1 tablets (50-100 mg total) by mouth daily as needed for erectile dysfunction. 02/14/13  Yes [name], MD     History   Social History  . Marital Status: Legally Separated    Spouse Name: N/A    Number of Children: N/A  . Years of Education: N/A   Occupational History  . Not on file.   Social History Main Topics  . Smoking status: Former Smoker -- 2.00 packs/day for 52 years    Types: Cigarettes    Start date: 08/27/1965    Quit date: 08/27/2000  . Smokeless tobacco: Never Used     Comment: quit 13 years ago  . Alcohol Use: Yes     Comment: once every 2 or 3 months  . Drug Use: No  . Sexual Activity: Not on file   Other Topics Concern  . Not on file   Social History Narrative  . No narrative on file    No family status information on file.   Family History  Problem Relation Age of Onset  . Heart failure Mother   . Cancer Father     colon  . Hypertension Mother   . Hypertension Father   . Stroke Maternal Grandmother   . Diabetes Father   . Diabetes Paternal Grandfather      ROS:  Full 14 point review of systems complete and found to be negative unless listed above.  Physical Exam: Blood pressure 164/79, pulse 74, temperature 97.5 F (36.4 C), resp. rate 20, height 6\' 4"  (1.93 m), weight 374 lb 12.5 oz (170 kg), SpO2 95.00%.  General: Morbidly obese, male in no acute distress Head: Eyes PERRLA, No xanthomas.   Normocephalic and atraumatic, oropharynx without edema or exudate. Dentition:  Lungs: CTAB Heart: HRRR S1 S2, no rub/gallop, Heart irregular rate and rhythm with S1, S2  murmur. pulses are 1+ extrem.  Neck:  No carotid bruits. Abdomen: Bowel sounds present, abdomen non tender. Protuberant and distended abdomen Msk:  No spine or cva tenderness. No weakness, no joint deformities or effusions. Extremities: No clubbing or cyanosis. 1+ edema in LEs. Chronic venous stasis on bilateral LEs Neuro: Alert and oriented X 3. No focal deficits noted. Psych:  Good affect, responds appropriately  Labs:   Lab Results  Component Value Date   WBC 9.5 08/27/2013   HGB 14.8 08/27/2013   HCT 44.2 08/27/2013  MCV 87.0 08/27/2013   PLT 232 08/27/2013   No results found for this basename: INR,  in the last 72 hours   Recent Labs Lab 08/27/13 0825 08/27/13 1445  NA 138  --   K 4.6  --   CL 102  --   CO2 25  --   BUN 21  --   CREATININE 0.81 0.82  CALCIUM 9.0  --   PROT 7.0  --   BILITOT 0.3  --   ALKPHOS 70  --   ALT 31  --   AST 36  --   GLUCOSE 129*  --   ALBUMIN 3.6  --      Recent Labs  08/27/13 0844  TROPIPOC 0.00   Pro B Natriuretic peptide (BNP)  Date/Time Value Range Status  08/27/2013  8:25 AM 1406.0* 0 - 125 pg/mL Final  02/06/2008  4:05 AM <30.0   Final   Lab Results  Component Value Date   CHOL 212* 02/14/2013   HDL 37.90* 02/14/2013   TRIG 154.0* 02/14/2013     Echo: 2009 2D Measurements LEFT VENTRICLE NORMAL LVID ed (chordal) 51 mm 36-56 mm LVID es (chordal) 38 mm -- FS (chordal) 25 % 28-44% IVS ed 16 mm 6-11 mm LVPW ed 16 mm 6-11 mm AORTA NORMAL AoD (root) 40 mm <33mm LEFT ATRIUM NORMAL LAD 34 mm 19-52mm LAD index (A-P) 1.2 cm/m^2 <2.2 cm/m^2  Doppler measurements LEFT VENTRICLE NORMAL Tissue Doppler LV Ea (lat annulus) 5.6 cm/sec -- LV E/Ea (lat annulus) 10 -- LV Ea (med annulus) 4.5 cm/sec -- LV E/Ea (med annulus) 12.4 -- MITRAL VALVE NORMAL Peak E velocity 55.8 cm/sec -- Peak A velocity 69.6 cm/sec -- MV peak E/A 0.8 -- Peak gradient 1 mmHg -- --------------------------------------------------------------- LEFT VENTRICLE: - Left ventricular size  was normal. - Overall left ventricular systolic function was normal. - Left ventricular ejection fraction was estimated to be 60 %. - This study was inadequate for the evaluation of left ventricular regional wall motion. - Left ventricular wall thickness was normal. Doppler interpretation(s): - Features were consistent with diastolic dysfunction. AORTIC VALVE: - The aortic valve was not well visualized. - The aortic valve was probably trileaflet. - Aortic valve thickness was normal. Doppler interpretation(s): - There was no evidence for aortic valve stenosis. - There was no significant aortic valvular regurgitation. AORTA: - The aortic root was at the upper limits of normal in size. MITRAL VALVE: - Mitral valve structure was normal. - There was normal mitral valve leaflet excursion. Doppler interpretation(s): - The transmitral velocity was within the normal range. - There was no evidence for mitral stenosis. - There was no significant mitral valvular regurgitation. LEFT ATRIUM: - Left atrial size was normal. RIGHT VENTRICLE: - The right ventricle was grossly normal. PULMONIC VALVE: - The pulmonic valve was not well visualized. TRICUSPID VALVE: - The tricuspid valve was not well visualized. Doppler interpretation(s): - The transtricuspid velocity was within the normal range. - There was no evidence for tricuspid stenosis. RIGHT ATRIUM: - The right atrium was grossly normal. PERICARDIUM: - Very prominent epicardial fat pad. Given history of cancer cannot exclude complicated effusion or associated mass, though I feel these are less likely. -------------------------------------------------------------- SUMMARY - Overall left ventricular systolic function was normal. Left ventricular ejection fraction was estimated to be 60 %. This study was inadequate for the evaluation of left ventricular regional wall motion. - The aortic root was at the upper limits of normal in size. -  Very prominent  epicardial fat pad. Given history of cancer cannot exclude complicated effusion or associated mass, though I feel these are less likely. IMPRESSIONS - Technically difficult study due to poor sound wave transmission.    ECG: 08/27/13 Vent. rate 89 BPM PR interval 241 ms QRS duration 92 ms QT/QTc 376/457 ms P-R-T axes 38 -18 60  1st degree AV block  No acute changes   Radiology:  Dg Chest Portable 1 View  08/27/2013   CLINICAL DATA:  Severe shortness of breath.  EXAM: PORTABLE CHEST - 1 VIEW  COMPARISON:  07/05/2010  FINDINGS: The cardiac silhouette appears enlarged for portable technique. There is marked diffuse pulmonary vascular congestion. There is diffuse bilateral interstitial prominence, with probable early airspace disease at the lung bases bilaterally. No visible pleural effusion by portable technique. No acute osseous abnormality identified.  IMPRESSION: Congestive heart failure pattern.   Electronically Signed   By: Curlene Dolphin M.D.   On: 08/27/2013 08:30    ASSESSMENT AND PLAN:    Active Problems:   NON-HODGKIN'S LYMPHOMA   CHF (congestive heart failure)   Malignant hypertension   Acute respiratory failure  1. Acute decompensated CHF: He presents with clinical s/s consistent with acute CHF. Denies cardiac history. Significant improvement after the demonstration of IV Lasix in the emergency department. Will continue IV Lasix at 40 mg IV daily, continue his beta blocker, ARB. Monitor I/Os and daily weights. Ordered ECHO.  2. Hyperlipidemia: borderline in May. Repeat fasting lipids  3. Malignant hypertension. Patient presenting with blood pressure of 230/130, started on a nitroglycerin drip in the emergency department. Blood pressures are now improved. Will continue nitro paste, metoprolol 50 mg twice a day, irbesartan 300 mg by mouth daily as well as Lasix 40 mg IV daily. Monitor blood pressures closely today.  4. Acute hypoxemic respiratory failure:  requiring CPAP in the emergency department. Likely secondary to acute decompensated congestive heart failure. Patient now stabilized, significant improvement with IV Lasix. Will monitor closely    Signed: Perry Mount, PA-C 08/27/2013 5:20 PM   See previous note. Patient examined chart reviewed Differential is diastolic vs systolic DCM Echo ordered Chronic LE edema with venous insuficiency.  History and CXR with elevated BNP consistant with CHF PND and orthopnea.  Quick response to lasix and markedly elevated BP On exam suggest diastolic dysfunction.  Would cath only if EF reduced by echo.  Previous history  Of chemo for lymphoma may suggest late toxicity  [name]

## 2013-08-27 NOTE — ED Notes (Addendum)
Pt to department via EMS- pt reports increased sob this morning. Pt with no hx of CHF or COPD according to him. Pt sats in the 80's prior to c-pap with EMS. Reports that pt became very sob with movement. Pt A&Ox4 on arrival. Pt reports he takes lasix. Bp-180/90 Hr-97 Pt received 4 nitro pta. Pt reports he took 1 asa pta himself.

## 2013-08-27 NOTE — ED Provider Notes (Signed)
CSN: [id number]     Arrival date & time 08/27/13  0802 History   First MD Initiated Contact with Patient 08/27/13 [phone]     Chief Complaint  Patient presents with  . Shortness of Breath   (Consider location/radiation/quality/duration/timing/severity/associated sxs/prior Treatment) HPI Onset was this morning, hours ago, gradual, constant, worsening.  The pain is currently none. Pt states there was some discomfort with heavy breathing but no chest pain. Modifying factors: much improved with cpap that was initiated by EMS.  Associated symptoms: bilateral leg swelling.  Recent medical care: EMS initiated cpap and gave nitro. EMS reports initial blood pressure 230/130.    Past Medical History  Diagnosis Date  . Hypertension   . Diverticulitis   . Headache(784.0)   . Lymphoma, non Hodgkin's     sees Dr. [name]   . Diabetes mellitus without complication    Past Surgical History  Procedure Laterality Date  . Hernia repair      umblical  . Incarcerated hernia      vental 11/19/08 Dr. [name]   Family History  Problem Relation Age of Onset  . Diabetes    . Hypertension     History  Substance Use Topics  . Smoking status: Former Smoker    Types: Cigarettes  . Smokeless tobacco: Never Used     Comment: quit 13 years ago  . Alcohol Use: Yes     Comment: once every 2 or 3 months    Review of Systems Constitutional: Negative for fever.  Eyes: Negative for vision loss.  ENT: Negative for difficulty swallowing.  Cardiovascular: Positive for leg swelling.  Respiratory: Positive for respiratory distress.  Gastrointestinal:  Negative for vomiting.  Genitourinary: Negative for inability to void.  Musculoskeletal: Negative for gait problem.  Integumentary: Negative for rash.  Neurological: Negative for new focal weakness.     Allergies  Review of patient's allergies indicates no known allergies.  Home Medications   Current Outpatient Rx  Name  Route  Sig  Dispense  Refill   . aspirin 325 MG tablet   Oral   Take 650 mg by mouth once.         Marland Kitchen aspirin 81 MG tablet   Oral   Take 81 mg by mouth daily.         . furosemide (LASIX) 40 MG tablet   Oral   Take 40 mg by mouth.          Marland Kitchen ibuprofen (ADVIL,MOTRIN) 200 MG tablet   Oral   Take 200 mg by mouth every 6 (six) hours as needed for headache or mild pain.         . metFORMIN (GLUCOPHAGE) 500 MG tablet   Oral   Take 1 tablet (500 mg total) by mouth 2 (two) times daily with a meal.   60 tablet   11   . metoprolol (TOPROL-XL) 200 MG 24 hr tablet      take 1 tablet by mouth once daily   30 tablet   11   . Multiple Vitamin (MULTIVITAMIN WITH MINERALS) TABS tablet   Oral   Take 1 tablet by mouth daily.         Marland Kitchen olmesartan (BENICAR) 40 MG tablet   Oral   Take 40 mg by mouth daily.         . potassium chloride (KLOR-CON M10) 10 MEQ tablet      take 1 tablet by mouth once daily   30 tablet   11   .  sildenafil (VIAGRA) 100 MG tablet   Oral   Take 0.5-1 tablets (50-100 mg total) by mouth daily as needed for erectile dysfunction.   10 tablet   11    BP 127/65  Pulse 61  Resp 17  SpO2 100% Physical Exam Nursing note and vitals reviewed.  Constitutional: Pt is alert and appears stated age. Tachypnea.  Eyes: No injection, no scleral icterus. HENT: Atraumatic, airway open without erythema or exudate.  Respiratory: No respiratory distress. Equal breathing bilaterally. Bilateral rales.  Cardiovascular: Normal rate. Regular rhythm. Extremities warm and well perfused. Pitting edema bilaterally.  Abdomen: Soft, non-tender, distended.  MSK: Extremities are atraumatic without deformity. Skin: No rash, no wounds.   Neuro: No motor nor sensory deficit.     ED Course  Procedures (including critical care time) Labs Review Labs Reviewed  COMPREHENSIVE METABOLIC PANEL - Abnormal; Notable for the following:    Glucose, Bld 129 (*)    GFR calc non Af Amer 90 (*)    All other  components within normal limits  PRO B NATRIURETIC PEPTIDE - Abnormal; Notable for the following:    Pro B Natriuretic peptide (BNP) 1406.0 (*)    All other components within normal limits  POCT I-STAT 3, BLOOD GAS (G3P V) - Abnormal; Notable for the following:    pH, Ven 7.406 (*)    pO2, Ven 156.0 (*)    Bicarbonate 29.1 (*)    Acid-Base Excess 4.0 (*)    All other components within normal limits  CBC WITH DIFFERENTIAL  POCT I-STAT TROPONIN I   Imaging Review Dg Chest Portable 1 View  08/27/2013   CLINICAL DATA:  Severe shortness of breath.  EXAM: PORTABLE CHEST - 1 VIEW  COMPARISON:  07/05/2010  FINDINGS: The cardiac silhouette appears enlarged for portable technique. There is marked diffuse pulmonary vascular congestion. There is diffuse bilateral interstitial prominence, with probable early airspace disease at the lung bases bilaterally. No visible pleural effusion by portable technique. No acute osseous abnormality identified.  IMPRESSION: Congestive heart failure pattern.   Electronically Signed   By: Curlene Dolphin M.D.   On: 08/27/2013 08:30    EKG Interpretation     Ventricular Rate:  89 PR Interval:  241 QRS Duration: 92 QT Interval:  376 QTC Calculation: 457 R Axis:   -18 Text Interpretation:  Sinus rhythm Ventricular premature complex Prolonged PR interval Probable left atrial enlargement Borderline left axis deviation since prior tracing, PR interval prolonged and PVC present            MDM   1. Congestive heart failure (CHF)    67 y.o. male w/ PMHx of HTN, DM, lymphoma presents w/ respiratory distress. Pt without history of CAD. Exam c/w pulmonary edema. Blood pressure 177/109. Will place on bipap, given aspirin, start nitro gtt. EKG without ST elevation. CXR, labs pending. Pt and family counseled on need for admission. Questions answered.   CXR with bilateral edema, cardiomegaly c/w CHF. BNP 1400. Troponin neg. CBC, CMP unremarkable.   Pt doing well on  re-eval. Taken off bipap. SBP 130. Pt stable on RA, able to speak in complete sentences. Pt updated. Medicine called for admission.    I independently viewed, interpreted, and used in my medical decision making all ordered lab and imaging tests. Medical Decision Making discussed with ED attending Houston Siren, MD      Dayton Scrape, MD 08/27/13 [phone]

## 2013-08-27 NOTE — Progress Notes (Signed)
Patient ID: [name], male   DOB: Feb 22, 1946, 67 y.o.   MRN: [id number] See note from PA Patient examined chart reviewed Morbidly obese sedentary male with acute onset dyspnea.  Weight up and poor diet BNP over 1400 with CXR showing kerley B lines and cephalization No chest pain ECG normal Troponin negative   No history to suggest DVT/PE Responded well to lasix  History of lymphoma with [facility] Rx  EF normal at that time by echo in 2009  Likely CHF:  Differential will be for diastolic vs systolic.  Diastolic supported by obesity, uncotrolled HTN, dietary Indiscretion and quick response to lasix. Possible late toxicity from chemo.  Medical Rx if EF normal by echo and No evidence of pulmonar HTN.  Will need right and left cath if EF down or PA pressures elevated  Discussed with patient and wife.  [name]

## 2013-08-28 DIAGNOSIS — I517 Cardiomegaly: Secondary | ICD-10-CM

## 2013-08-28 DIAGNOSIS — I509 Heart failure, unspecified: Secondary | ICD-10-CM | POA: Diagnosis not present

## 2013-08-28 DIAGNOSIS — R609 Edema, unspecified: Secondary | ICD-10-CM | POA: Diagnosis not present

## 2013-08-28 LAB — GLUCOSE, CAPILLARY
Glucose-Capillary: 120 mg/dL — ABNORMAL HIGH (ref 70–99)
Glucose-Capillary: 128 mg/dL — ABNORMAL HIGH (ref 70–99)
Glucose-Capillary: 130 mg/dL — ABNORMAL HIGH (ref 70–99)
Glucose-Capillary: 94 mg/dL (ref 70–99)

## 2013-08-28 LAB — LIPID PANEL
Cholesterol: 174 mg/dL (ref 0–200)
HDL: 36 mg/dL — ABNORMAL LOW (ref >39)
LDL Cholesterol: 113 mg/dL — ABNORMAL HIGH (ref 0–99)
Total CHOL/HDL Ratio: 4.8 RATIO
Triglycerides: 124 mg/dL (ref <150)
VLDL: 25 mg/dL (ref 0–40)

## 2013-08-28 LAB — COMPREHENSIVE METABOLIC PANEL
AST: 19 U/L (ref 0–37)
Albumin: 3.5 g/dL (ref 3.5–5.2)
BUN: 18 mg/dL (ref 6–23)
Calcium: 9 mg/dL (ref 8.4–10.5)
Chloride: 101 mEq/L (ref 96–112)
Creatinine, Ser: 0.75 mg/dL (ref 0.50–1.35)
Total Bilirubin: 0.7 mg/dL (ref 0.3–1.2)
Total Protein: 6.9 g/dL (ref 6.0–8.3)

## 2013-08-28 MED ORDER — FUROSEMIDE 10 MG/ML IJ SOLN
80.0000 mg | Freq: Two times a day (BID) | INTRAMUSCULAR | Status: DC
  Administered 2013-08-28 – 2013-08-30 (×5): 80 mg via INTRAVENOUS
  Filled 2013-08-28 (×6): qty 8

## 2013-08-28 NOTE — Progress Notes (Signed)
Met with Mr Nawaz at bedside and family to explain and offer [facility] services. He reports "this is all new" interms of his current medical issues. He is interested in [facility] services and consents were signed. Reports he can use the additional support and reinforcement. Family tells this Probation officer in confidence that patient is very forgetful so he will need the additional follow up. Explained that [facility] will not interfere with services provided by home health. He can also benefit from [facility] RN as well for his CHF disease management. Left [facility] packet at bedside. Family and patient appreciative of the visit.  Marthenia Rolling, MSN-Ed, RN,BSN- [facility] [phone]

## 2013-08-28 NOTE — Progress Notes (Signed)
The patient was placed on 2L of O2 this morning for comfort when he became short of breath.  His O2 sats were in the upper 90s on RA.  His lung bases sounded wet with crackles throughout.  The on-call PA with cardiology was notified.  New orders were given to administer his 1000 40 mg dose of IV Lasix now.  The patient's IV was malpositioned and could not be fixed, so the RN could not carry out the orders at this time.  The IV team was paged to replace the IV and the oncoming nurse was notified.

## 2013-08-28 NOTE — Progress Notes (Signed)
  Echocardiogram 2D Echocardiogram has been performed.  Mauricio Po 08/28/2013, 4:21 PM

## 2013-08-28 NOTE — Progress Notes (Signed)
A heart failure packet was given to the patient and heart failure education was started.  Daily weights, when to contact the physician based on weight, limiting salt in the diet, and fluid restrictions were discussed.  Teach back was implemented.

## 2013-08-28 NOTE — Progress Notes (Signed)
Subjective:  No chest pain. Dyspnea improved although no weight loss yet. Echo pending.  Objective:  Vital Signs in the last 24 hours: Temp:  [97.5 F (36.4 C)-97.7 F (36.5 C)] 97.7 F (36.5 C) (11/12 0639) Pulse Rate:  [60-95] 80 (11/12 0639) Resp:  [15-23] 17 (11/12 0639) BP: (127-177)/(52-109) 173/97 mmHg (11/12 0639) SpO2:  [93 %-100 %] 99 % (11/12 0639) Weight:  [374 lb 12.5 oz (170 kg)-378 lb 1.4 oz (171.5 kg)] 378 lb 1.4 oz (171.5 kg) (11/12 0639)  Intake/Output from previous day: 11/11 0701 - 11/12 0700 In: 243 [P.O.:240; I.V.:3] Out: 1425 [Urine:1425] Intake/Output from this shift:    . aspirin  325 mg Oral Daily  . docusate sodium  100 mg Oral BID  . enoxaparin (LOVENOX) injection  85 mg Subcutaneous Q24H  . furosemide  40 mg Intravenous Daily  . insulin aspart  0-15 Units Subcutaneous TID WC  . insulin aspart  0-5 Units Subcutaneous QHS  . irbesartan  300 mg Oral Daily  . metoprolol tartrate  50 mg Oral BID  . multivitamin with minerals  1 tablet Oral Daily  . nitroGLYCERIN  0.5 inch Topical Q6H  . sodium chloride  3 mL Intravenous Q12H  . sodium chloride  3 mL Intravenous Q12H      Physical Exam: The patient appears to be in no distress.  Head and neck exam reveals that the pupils are equal and reactive.  The extraocular movements are full.  There is no scleral icterus.  Mouth and pharynx are benign.  No lymphadenopathy.  No carotid bruits.  The jugular venous pressure is normal.  Thyroid is not enlarged or tender.  Chest Minimal basilar rales. No wheezing.  Heart reveals no abnormal lift or heave.  First and second heart sounds are normal.  There is no murmur gallop rub or click. Pulse regular.  The abdomen is markedly obese.  Extremities reveal bilateral edema right >left. Neurologic exam is normal strength and no lateralizing weakness.  No sensory deficits.  Integument reveals Stasis dermatitis.  Lab Results:  Recent Labs   08/27/13 0825 08/27/13 1445  WBC 9.9 9.5  HGB 14.8 14.8  PLT 238 232    Recent Labs  08/27/13 0825 08/27/13 1445 08/28/13 0558  NA 138  --  138  K 4.6  --  3.8  CL 102  --  101  CO2 25  --  26  GLUCOSE 129*  --  114*  BUN 21  --  18  CREATININE 0.81 0.82 0.75    Recent Labs  08/27/13 1915 08/28/13 0032  TROPONINI <0.30 <0.30   Hepatic Function Panel  Recent Labs  08/28/13 0558  PROT 6.9  ALBUMIN 3.5  AST 19  ALT 27  ALKPHOS 69  BILITOT 0.7    Recent Labs  08/28/13 0032  CHOL 174   No results found for this basename: PROTIME,  in the last 72 hours  Imaging: Dg Chest Portable 1 View  08/27/2013   CLINICAL DATA:  Severe shortness of breath.  EXAM: PORTABLE CHEST - 1 VIEW  COMPARISON:  07/05/2010  FINDINGS: The cardiac silhouette appears enlarged for portable technique. There is marked diffuse pulmonary vascular congestion. There is diffuse bilateral interstitial prominence, with probable early airspace disease at the lung bases bilaterally. No visible pleural effusion by portable technique. No acute osseous abnormality identified.  IMPRESSION: Congestive heart failure pattern.   Electronically Signed   By: Curlene Dolphin M.D.   On: 08/27/2013  08:30    Cardiac Studies: Telemetry NSR with occ PVCs Assessment/Plan:  1.        Acute decompensated CHF: He presents with clinical s/s consistent with acute CHF. Continue BB. ARB.  Will increase lasix.Differential will be for diastolic vs systolic. Diastolic supported by obesity, uncotrolled HTN, dietary  Indiscretion and quick response to lasix. Possible late toxicity from chemo. Medical Rx if EF normal by echo and  No evidence of pulmonar HTN. Will need right and left cath if EF down or PA pressures elevated  2. Hyperlipidemia: borderline in May. Repeat fasting lipids 3. Malignant hypertension. Patient presenting with blood pressure of 230/130, started on a nitroglycerin drip in the emergency department. Blood  pressures are now improved. Will continue nitro paste, metoprolol 50 mg twice a day, irbesartan 300 mg by mouth daily. . 4. Acute hypoxemic respiratory failure: requiring CPAP in the emergency department. Likely secondary to acute decompensated congestive heart failure. Patient now stabilized, significant improvement with IV Lasix. Will monitor closely.  Plan: 2D echo today. Lasix increased. Renal function normal.   LOS: 1 day    [name] 08/28/2013, 7:32 AM

## 2013-08-28 NOTE — Progress Notes (Signed)
[facility] PROGRESS NOTE      [name] [id number] DOB: May 13, 1946 DOA: 08/27/2013 PCP: [name], MD  Assessment/Plan: Acute decompensated congestive heart failure. He presents with clinical signs and symptoms consistent with acute CHF. - much improved today after diuresis - 2D echo pending - cards following, appreciate input.  Malignant hypertension. Patient presenting with blood pressure of 230/130, started on a nitroglycerin drip in the emergency department. Blood pressures are now improved.  Type 2 diabetes mellitus. Will discontinue metformin, perform Accu-Cheks q. A.c. And each bedtime with sliding scale coverage. Aic 6.0 Acute hypoxemic respiratory failure, requiring CPAP in the emergency department. Likely secondary to acute decompensated congestive heart failure.  - resolved Obesity HLD  Diet: heart Fluids: none DVT Prophylaxis: lovenox  Code Status: Full Family Communication: wife, children bedside  Disposition Plan: inpatient  Consultants:  Cardiology  Procedures:  none   Antibiotics  HPI/Subjective: - feeling better today  Objective: Filed Vitals:   08/27/13 1700 08/27/13 2140 08/28/13 0639 08/28/13 0906  BP: 164/79 162/80 173/97 165/94  Pulse: 74 68 80 78  Temp: 97.5 F (36.4 C) 97.6 F (36.4 C) 97.7 F (36.5 C)   TempSrc: Oral Oral Oral   Resp: 20 18 17    Height: 6\' 4"  (1.93 m)     Weight: 170 kg (374 lb 12.5 oz)  171.5 kg (378 lb 1.4 oz)   SpO2: 95% 94% 99%     Intake/Output Summary (Last 24 hours) at 08/28/13 1321 Last data filed at 08/28/13 1100  Gross per 24 hour  Intake    963 ml  Output   2430 ml  Net  -1467 ml   Filed Weights   08/27/13 1129 08/27/13 1700 08/28/13 0639  Weight: 170 kg (374 lb 12.5 oz) 170 kg (374 lb 12.5 oz) 171.5 kg (378 lb 1.4 oz)    Exam:   General:  NAD, obese caucasian male  Cardiovascular: regular rate and rhythm, without MRG  Respiratory: good air movement, clear to  auscultation throughout, no wheezing, ronchi or rales  Abdomen: soft, not tender to palpation, positive bowel sounds  MSK: traceperipheral edema; chronic venous stasis changes  Neuro: non focal  Data Reviewed: Basic Metabolic Panel:  Recent Labs Lab 08/27/13 0825 08/27/13 1445 08/28/13 0558  NA 138  --  138  K 4.6  --  3.8  CL 102  --  101  CO2 25  --  26  GLUCOSE 129*  --  114*  BUN 21  --  18  CREATININE 0.81 0.82 0.75  CALCIUM 9.0  --  9.0   Liver Function Tests:  Recent Labs Lab 08/27/13 0825 08/28/13 0558  AST 36 19  ALT 31 27  ALKPHOS 70 69  BILITOT 0.3 0.7  PROT 7.0 6.9  ALBUMIN 3.6 3.5   CBC:  Recent Labs Lab 08/27/13 0825 08/27/13 1445  WBC 9.9 9.5  NEUTROABS 7.6  --   HGB 14.8 14.8  HCT 44.9 44.2  MCV 86.7 87.0  PLT 238 232   Cardiac Enzymes:  Recent Labs Lab 08/27/13 1405 08/27/13 1915 08/28/13 0032  TROPONINI <0.30 <0.30 <0.30   BNP (last 3 results)  Recent Labs  08/27/13 0825  PROBNP 1406.0*   CBG:  Recent Labs Lab 08/27/13 1627 08/27/13 2136 08/28/13 0636 08/28/13 1055  GLUCAP 116* 114* 120* 128*   Studies: Dg Chest Portable 1 View  08/27/2013   CLINICAL DATA:  Severe shortness of breath.  EXAM: PORTABLE CHEST - 1 VIEW  COMPARISON:  07/05/2010  FINDINGS: The cardiac silhouette appears enlarged for portable technique. There is marked diffuse pulmonary vascular congestion. There is diffuse bilateral interstitial prominence, with probable early airspace disease at the lung bases bilaterally. No visible pleural effusion by portable technique. No acute osseous abnormality identified.  IMPRESSION: Congestive heart failure pattern.   Electronically Signed   By: Curlene Dolphin M.D.   On: 08/27/2013 08:30   Scheduled Meds: . aspirin  325 mg Oral Daily  . docusate sodium  100 mg Oral BID  . enoxaparin (LOVENOX) injection  85 mg Subcutaneous Q24H  . furosemide  80 mg Intravenous BID  . insulin aspart  0-15 Units Subcutaneous TID  WC  . insulin aspart  0-5 Units Subcutaneous QHS  . irbesartan  300 mg Oral Daily  . metoprolol tartrate  50 mg Oral BID  . multivitamin with minerals  1 tablet Oral Daily  . nitroGLYCERIN  0.5 inch Topical Q6H  . sodium chloride  3 mL Intravenous Q12H  . sodium chloride  3 mL Intravenous Q12H   Continuous Infusions:   Active Problems:   NON-HODGKIN'S LYMPHOMA   CHF (congestive heart failure)   Malignant hypertension   Acute respiratory failure  Time spent: [place], MD [facility] Pager [phone]. If 7 PM - 7 AM, please contact night-coverage at [email or web address], password [facility] 08/28/2013, 1:21 PM  LOS: 1 day

## 2013-08-29 DIAGNOSIS — I10 Essential (primary) hypertension: Secondary | ICD-10-CM | POA: Diagnosis not present

## 2013-08-29 DIAGNOSIS — J9600 Acute respiratory failure, unspecified whether with hypoxia or hypercapnia: Secondary | ICD-10-CM | POA: Diagnosis not present

## 2013-08-29 DIAGNOSIS — I5023 Acute on chronic systolic (congestive) heart failure: Secondary | ICD-10-CM | POA: Diagnosis not present

## 2013-08-29 LAB — BASIC METABOLIC PANEL
BUN: 20 mg/dL (ref 6–23)
CO2: 27 mEq/L (ref 19–32)
Chloride: 99 mEq/L (ref 96–112)
Creatinine, Ser: 0.72 mg/dL (ref 0.50–1.35)
GFR calc Af Amer: >90 mL/min (ref >90)
GFR calc non Af Amer: >90 mL/min (ref >90)
Potassium: 3.5 mEq/L (ref 3.5–5.1)

## 2013-08-29 LAB — CBC
HCT: 44.4 % (ref 39.0–52.0)
Hemoglobin: 15 g/dL (ref 13.0–17.0)
MCHC: 33.8 g/dL (ref 30.0–36.0)
MCV: 85.2 fL (ref 78.0–100.0)
Platelets: 223 10*3/uL (ref 150–400)
RDW: 14.2 % (ref 11.5–15.5)

## 2013-08-29 LAB — GLUCOSE, CAPILLARY
Glucose-Capillary: 97 mg/dL (ref 70–99)
Glucose-Capillary: 99 mg/dL (ref 70–99)

## 2013-08-29 MED ORDER — POTASSIUM CHLORIDE CRYS ER 20 MEQ PO TBCR
20.0000 meq | EXTENDED_RELEASE_TABLET | Freq: Three times a day (TID) | ORAL | Status: DC
  Administered 2013-08-29 – 2013-08-30 (×6): 20 meq via ORAL
  Filled 2013-08-29 (×9): qty 1

## 2013-08-29 MED ORDER — SPIRONOLACTONE 12.5 MG HALF TABLET
12.5000 mg | ORAL_TABLET | Freq: Every day | ORAL | Status: DC
  Administered 2013-08-29 – 2013-08-30 (×2): 12.5 mg via ORAL
  Filled 2013-08-29 (×2): qty 1

## 2013-08-29 MED ORDER — DIAZEPAM 5 MG PO TABS
5.0000 mg | ORAL_TABLET | ORAL | Status: AC
  Administered 2013-08-30: 5 mg via ORAL
  Filled 2013-08-29: qty 1

## 2013-08-29 MED ORDER — SODIUM CHLORIDE 0.9 % IJ SOLN
3.0000 mL | Freq: Two times a day (BID) | INTRAMUSCULAR | Status: DC
  Administered 2013-08-29: 3 mL via INTRAVENOUS

## 2013-08-29 MED ORDER — SODIUM CHLORIDE 0.9 % IJ SOLN
3.0000 mL | INTRAMUSCULAR | Status: DC | PRN
  Administered 2013-08-30: 3 mL via INTRAVENOUS

## 2013-08-29 MED ORDER — SODIUM CHLORIDE 0.9 % IV SOLN: 250.0000 mL | INTRAVENOUS | Status: DC | PRN

## 2013-08-29 NOTE — Plan of Care (Signed)
Problem: Consults Goal: Cardiac Cath Patient Education (See Patient Education module for education specifics.) Outcome: Completed/Met Date Met:  08/29/13 Pt and wife watched educational video on heart cath

## 2013-08-29 NOTE — Progress Notes (Signed)
[facility] PROGRESS NOTE  Filed Weights   08/27/13 1700 08/28/13 0639 08/29/13 0500  Weight: 170 kg (374 lb 12.5 oz) 171.5 kg (378 lb 1.4 oz) 166.5 kg (367 lb 1.1 oz)        Intake/Output Summary (Last 24 hours) at 08/29/13 1111 Last data filed at 08/29/13 0843  Gross per 24 hour  Intake    840 ml  Output   1475 ml  Net   -635 ml     Assessment/Plan:  Acute respiratory failure due to  Acute systolic CHF (congestive heart failure), NYHA class 2: - off bipap. - On lasix IV, ARB, bet-blockers. Add aldactone. - good diuresis negative about 3.0L.  - Bp mildly high. Fluid restricit - estimated dry weight around 350 lb.    Malignant hypertension - improved controlled. - add aldactone     Code Status: Full  Family Communication: wife, children bedside  Disposition Plan: inpatient  Consultants:  Cardiology Procedures:  none  Procedures: ECHO: estimated ejection fraction was in the range of 35% to 40%   Antibiotics:  none   HPI/Subjective: none  Objective: Filed Vitals:   08/28/13 2037 08/29/13 0014 08/29/13 0500 08/29/13 1039  BP: 143/84 136/76 159/77 148/85  Pulse: 67 65 69 73  Temp: 97.9 F (36.6 C) 97.7 F (36.5 C) 97.8 F (36.6 C) 98 F (36.7 C)  TempSrc: Oral Oral Oral Oral  Resp: 20 18 18    Height:      Weight:   166.5 kg (367 lb 1.1 oz)   SpO2: 100% 98% 97% 97%     Exam:  General: Alert, awake, oriented x3, in no acute distress.  HEENT: No bruits, no goiter.  Heart: Regular rate and rhythm, lower extremity edema. Lungs: Good air movement, bilateral air movement.  Abdomen: Soft, nontender, nondistended, positive bowel sounds.  Neuro: Grossly intact, nonfocal.   Data Reviewed: Basic Metabolic Panel:  Recent Labs Lab 08/27/13 0825 08/27/13 1445 08/28/13 0558 08/29/13 0505  NA 138  --  138 138  K 4.6  --  3.8 3.5  CL 102  --  101 99  CO2 25  --  26 27  GLUCOSE 129*  --  114* 98  BUN 21  --  18 20  CREATININE 0.81 0.82 0.75  0.72  CALCIUM 9.0  --  9.0 9.3   Liver Function Tests:  Recent Labs Lab 08/27/13 0825 08/28/13 0558  AST 36 19  ALT 31 27  ALKPHOS 70 69  BILITOT 0.3 0.7  PROT 7.0 6.9  ALBUMIN 3.6 3.5   No results found for this basename: LIPASE, AMYLASE,  in the last 168 hours No results found for this basename: AMMONIA,  in the last 168 hours CBC:  Recent Labs Lab 08/27/13 0825 08/27/13 1445 08/29/13 0505  WBC 9.9 9.5 8.7  NEUTROABS 7.6  --   --   HGB 14.8 14.8 15.0  HCT 44.9 44.2 44.4  MCV 86.7 87.0 85.2  PLT 238 232 223   Cardiac Enzymes:  Recent Labs Lab 08/27/13 1405 08/27/13 1915 08/28/13 0032  TROPONINI <0.30 <0.30 <0.30   BNP (last 3 results)  Recent Labs  08/27/13 0825  PROBNP 1406.0*   CBG:  Recent Labs Lab 08/28/13 0636 08/28/13 1055 08/28/13 1613 08/28/13 2051 08/29/13 0614  GLUCAP 120* 128* 130* 94 101*    No results found for this or any previous visit (from the past 240 hour(s)).   Studies: No results found.  Scheduled Meds: . aspirin  325  mg Oral Daily  . docusate sodium  100 mg Oral BID  . enoxaparin (LOVENOX) injection  85 mg Subcutaneous Q24H  . furosemide  80 mg Intravenous BID  . insulin aspart  0-15 Units Subcutaneous TID WC  . insulin aspart  0-5 Units Subcutaneous QHS  . irbesartan  300 mg Oral Daily  . metoprolol tartrate  50 mg Oral BID  . multivitamin with minerals  1 tablet Oral Daily  . potassium chloride  20 mEq Oral TID  . sodium chloride  3 mL Intravenous Q12H  . sodium chloride  3 mL Intravenous Q12H   Continuous Infusions:    Charlynne Cousins  [facility] Pager [phone]. If 8PM-8AM, please contact night-coverage at [email or web address], password [facility] 08/29/2013, 11:11 AM  LOS: 2 days

## 2013-08-29 NOTE — Progress Notes (Signed)
Subjective:  Dyspnea is improved after good diuresis and weight loss yesterday.  No chest pain.  Objective:  Vital Signs in the last 24 hours: Temp:  [97.4 F (36.3 C)-97.9 F (36.6 C)] 97.8 F (36.6 C) (11/13 0500) Pulse Rate:  [65-69] 69 (11/13 0500) Resp:  [18-20] 18 (11/13 0500) BP: (136-159)/(76-84) 159/77 mmHg (11/13 0500) SpO2:  [97 %-100 %] 97 % (11/13 0500) Weight:  [367 lb 1.1 oz (166.5 kg)] 367 lb 1.1 oz (166.5 kg) (11/13 0500)  Intake/Output from previous day: 11/12 0701 - 11/13 0700 In: 1080 [P.O.:1080] Out: 3055 [Urine:3055] Intake/Output from this shift: Total I/O In: 240 [P.O.:240] Out: -   . aspirin  325 mg Oral Daily  . docusate sodium  100 mg Oral BID  . enoxaparin (LOVENOX) injection  85 mg Subcutaneous Q24H  . furosemide  80 mg Intravenous BID  . insulin aspart  0-15 Units Subcutaneous TID WC  . insulin aspart  0-5 Units Subcutaneous QHS  . irbesartan  300 mg Oral Daily  . metoprolol tartrate  50 mg Oral BID  . multivitamin with minerals  1 tablet Oral Daily  . sodium chloride  3 mL Intravenous Q12H  . sodium chloride  3 mL Intravenous Q12H      Physical Exam: The patient appears to be in no distress.  Head and neck exam reveals that the pupils are equal and reactive.  The extraocular movements are full.  There is no scleral icterus.  Mouth and pharynx are benign.  No lymphadenopathy.  No carotid bruits.  The jugular venous pressure is normal.  Thyroid is not enlarged or tender.  Chest clear.  Heart reveals no abnormal lift or heave.  First and second heart sounds are normal.  There is no murmur gallop rub or click. Pulse regular.  The abdomen is markedly obese.  Extremities reveal bilateral edema right >left. Neurologic exam is normal strength and no lateralizing weakness.  No sensory deficits.  Integument reveals Stasis dermatitis.  Lab Results:  Recent Labs  08/27/13 1445 08/29/13 0505  WBC 9.5 8.7  HGB 14.8 15.0  PLT 232  223    Recent Labs  08/28/13 0558 08/29/13 0505  NA 138 138  K 3.8 3.5  CL 101 99  CO2 26 27  GLUCOSE 114* 98  BUN 18 20  CREATININE 0.75 0.72    Recent Labs  08/27/13 1915 08/28/13 0032  TROPONINI <0.30 <0.30   Hepatic Function Panel  Recent Labs  08/28/13 0558  PROT 6.9  ALBUMIN 3.5  AST 19  ALT 27  ALKPHOS 69  BILITOT 0.7    Recent Labs  08/28/13 0032  CHOL 174   No results found for this basename: PROTIME,  in the last 72 hours  Imaging: 2D echo: Study Conclusions  - Left ventricle: The cavity size was mildly dilated. Wall thickness was increased in a pattern of mild LVH. Systolic function was moderately reduced. The estimated ejection fraction was in the range of 35% to 40%. Diffuse hypokinesis. - Left atrium: The atrium was moderately to severely dilated.  Cardiac Studies: Telemetry NSR with occ PVCs Assessment/Plan:  1.        Acute decompensated CHF: EF is 35-40%. 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 Will plan for right and left heart cath for tomorrow. 2. Hyperlipidemia: borderline in May. Repeat fasting lipids 3. Malignant hypertension. Improved after diuresis. Marland Kitchen 4. Acute hypoxemic respiratory failure: requiring CPAP in the emergency department. Likely secondary  to acute decompensated congestive heart failure. Patient now stabilized, significant improvement with IV Lasix. Will monitor closely.  Plan: Replete potassium. RL heart cath in am.   LOS: 2 days    Darlin Coco 08/29/2013, 9:45 AM

## 2013-08-29 NOTE — Clinical Documentation Improvement (Signed)
Possible Clinical Conditions?  Chronic Systolic Congestive Heart Failure Chronic Diastolic Congestive Heart Failure Chronic Systolic & Diastolic Congestive Heart Failure Acute Systolic Congestive Heart Failure Acute Diastolic Congestive Heart Failure Acute Systolic & Diastolic Congestive Heart Failure Acute on Chronic Systolic Congestive Heart Failure Acute on Chronic Diastolic Congestive Heart Failure Acute on Chronic Systolic & Diastolic Congestive Heart Failure Other Condition Cannot Clinically Determine  Supporting Information:  PLEASE SPECIFY TYPE & ACUITY OF CHF  Signs & Symptoms:(As per notes)"Acute decompensated congestive heart failure. He presents with clinical signs and symptoms consistent with acute CHF."     Thank You, Alessandra Grout, RN, BSN, CCDS Clinical Documentation Specialist:  [place] Information Management Cell=[phone]

## 2013-08-29 NOTE — Plan of Care (Signed)
Problem: Phase I Progression Outcomes Goal: EF % per last Echo/documented,Core Reminder form on chart Outcome: Completed/Met Date Met:  08/29/13 35-40%(08-28-13)

## 2013-08-30 ENCOUNTER — Encounter (HOSPITAL_COMMUNITY)
Admission: EM | Disposition: A | Discharge status: Home / Self Care | Payer: Self-pay | Source: Home / Self Care | Attending: Internal Medicine

## 2013-08-30 DIAGNOSIS — E785 Hyperlipidemia, unspecified: Secondary | ICD-10-CM | POA: Diagnosis not present

## 2013-08-30 DIAGNOSIS — J9600 Acute respiratory failure, unspecified whether with hypoxia or hypercapnia: Secondary | ICD-10-CM | POA: Diagnosis not present

## 2013-08-30 DIAGNOSIS — I10 Essential (primary) hypertension: Secondary | ICD-10-CM

## 2013-08-30 DIAGNOSIS — I5023 Acute on chronic systolic (congestive) heart failure: Secondary | ICD-10-CM | POA: Diagnosis not present

## 2013-08-30 DIAGNOSIS — I509 Heart failure, unspecified: Secondary | ICD-10-CM

## 2013-08-30 HISTORY — PX: LEFT AND RIGHT HEART CATHETERIZATION WITH CORONARY ANGIOGRAM: SHX5449

## 2013-08-30 LAB — POCT I-STAT 3, ART BLOOD GAS (G3+)
Bicarbonate: 27.6 mEq/L — ABNORMAL HIGH (ref 20.0–24.0)
pCO2 arterial: 42.9 mmHg (ref 35.0–45.0)
pH, Arterial: 7.416 (ref 7.350–7.450)
pO2, Arterial: 70 mmHg — ABNORMAL LOW (ref 80.0–100.0)

## 2013-08-30 LAB — BASIC METABOLIC PANEL
CO2: 25 mEq/L (ref 19–32)
Chloride: 100 mEq/L (ref 96–112)
Creatinine, Ser: 0.73 mg/dL (ref 0.50–1.35)
GFR calc Af Amer: >90 mL/min (ref >90)
Potassium: 3.8 mEq/L (ref 3.5–5.1)
Sodium: 141 mEq/L (ref 135–145)

## 2013-08-30 LAB — GLUCOSE, CAPILLARY: Glucose-Capillary: 125 mg/dL — ABNORMAL HIGH (ref 70–99)

## 2013-08-30 LAB — POCT I-STAT 3, VENOUS BLOOD GAS (G3P V)
TCO2: 29 mmol/L (ref 0–100)
pCO2, Ven: 48.7 mmHg (ref 45.0–50.0)
pH, Ven: 7.366 — ABNORMAL HIGH (ref 7.250–7.300)

## 2013-08-30 LAB — PROTIME-INR
INR: 1.01 (ref 0.00–1.49)
Prothrombin Time: 13.1 seconds (ref 11.6–15.2)

## 2013-08-30 SURGERY — LEFT AND RIGHT HEART CATHETERIZATION WITH CORONARY ANGIOGRAM
Anesthesia: LOCAL

## 2013-08-30 MED ORDER — HEPARIN (PORCINE) IN NACL 2-0.9 UNIT/ML-% IJ SOLN
INTRAMUSCULAR | Status: AC
  Filled 2013-08-30: qty 1000

## 2013-08-30 MED ORDER — ACETAMINOPHEN 325 MG PO TABS
650.0000 mg | ORAL_TABLET | ORAL | Status: DC | PRN
  Administered 2013-08-30: 650 mg via ORAL

## 2013-08-30 MED ORDER — FENTANYL CITRATE 0.05 MG/ML IJ SOLN
INTRAMUSCULAR | Status: AC
  Filled 2013-08-30: qty 2

## 2013-08-30 MED ORDER — ONDANSETRON HCL 4 MG/2ML IJ SOLN: 4.0000 mg | Freq: Four times a day (QID) | INTRAMUSCULAR | Status: DC | PRN

## 2013-08-30 MED ORDER — SPIRONOLACTONE 25 MG PO TABS
25.0000 mg | ORAL_TABLET | Freq: Every day | ORAL | Status: DC
  Administered 2013-08-31: 25 mg via ORAL
  Filled 2013-08-30: qty 1

## 2013-08-30 MED ORDER — ASPIRIN 81 MG PO CHEW
81.0000 mg | CHEWABLE_TABLET | Freq: Every day | ORAL | Status: DC
  Administered 2013-08-31: 81 mg via ORAL
  Filled 2013-08-30: qty 1

## 2013-08-30 MED ORDER — SODIUM CHLORIDE 0.9 % IV SOLN: INTRAVENOUS | Status: DC

## 2013-08-30 MED ORDER — LIDOCAINE HCL (PF) 1 % IJ SOLN
INTRAMUSCULAR | Status: AC
  Filled 2013-08-30: qty 30

## 2013-08-30 MED ORDER — OXYCODONE-ACETAMINOPHEN 5-325 MG PO TABS: 1.0000 | ORAL_TABLET | ORAL | Status: DC | PRN

## 2013-08-30 MED ORDER — MIDAZOLAM HCL 2 MG/2ML IJ SOLN
INTRAMUSCULAR | Status: AC
  Filled 2013-08-30: qty 2

## 2013-08-30 MED ORDER — SODIUM CHLORIDE 0.45 % IV SOLN
INTRAVENOUS | Status: AC
  Administered 2013-08-30: 14:00:00 via INTRAVENOUS

## 2013-08-30 MED ORDER — NITROGLYCERIN 0.2 MG/ML ON CALL CATH LAB
INTRAVENOUS | Status: AC
  Filled 2013-08-30: qty 1

## 2013-08-30 MED ORDER — DIAZEPAM 2 MG PO TABS: 2.0000 mg | ORAL_TABLET | ORAL | Status: DC | PRN

## 2013-08-30 MED ORDER — FUROSEMIDE 80 MG PO TABS
80.0000 mg | ORAL_TABLET | Freq: Two times a day (BID) | ORAL | Status: DC
  Administered 2013-08-30: 80 mg via ORAL
  Filled 2013-08-30 (×5): qty 1

## 2013-08-30 NOTE — CV Procedure (Signed)
   Cardiac Catheterization Procedure Note  Name: [name] MRN: [id number] DOB: [birth year]  Procedure: Right Heart Cath, Left Heart Cath, Selective Coronary Angiography, LV angiography  Indication: New onset CHF   Procedural Details: The right groin was prepped, draped, and anesthetized with 1% lidocaine. Using the modified Seldinger technique a 5 French sheath was placed in the right femoral artery and a 7 French sheath was placed in the right femoral vein. A Swan-Ganz catheter was used for the right heart catheterization. Standard protocol was followed for recording of right heart pressures and sampling of oxygen saturations. Fick cardiac output was calculated. Standard Judkins catheters were used for selective coronary angiography and left ventriculography. There were no immediate procedural complications. The patient was transferred to the post catheterization recovery area for further monitoring.  Procedural Findings: Hemodynamics RA Mean 8 RV 30/0 PA  27/12 PCWP Mean 9 LV  186/101 AO  177/106  Oxygen saturations: PA 61 AO 94  Cardiac Output (Fick) 5.67  Cardiac Index (Fick) 1.98   Coronary angiography: Coronary dominance: right  Left mainstem: Normal  Left anterior descending (LAD): Normal  D1:normal  D2: normal  IM: normal  Left circumflex (LCx): normal  OM1: small and normal  OM2 : very large branching artery normal  Right coronary artery (RCA):  Dominant and normal  The femoral artery was closed with Minx device.  The venous site was hand held  Final Conclusions:  Non ischemic DCM  In lab patient had desats suggesting significant component of sleep apnea and COPD  Needs CPAP Recommendations:  Medical Rx    [name] 08/30/2013, 1:42 PM

## 2013-08-30 NOTE — Interval H&P Note (Signed)
History and Physical Interval Note:  08/30/2013 12:53 PM  [name]  has presented today for surgery, with the diagnosis of cp  The various methods of treatment have been discussed with the patient and family. After consideration of risks, benefits and other options for treatment, the patient has consented to  Procedure(s): LEFT AND RIGHT HEART CATHETERIZATION WITH CORONARY ANGIOGRAM (N/A) as a surgical intervention .  The patient's history has been reviewed, patient examined, no change in status, stable for surgery.  I have reviewed the patient's chart and labs.  Questions were answered to the patient's satisfaction.     [name]

## 2013-08-30 NOTE — Care Management Note (Addendum)
    Page 1 of 2   08/31/2013     11:13:18 AM   CARE MANAGEMENT NOTE 08/31/2013  Patient:  [name], [name]   Account Number:  [id number]  Date Initiated:  08/30/2013  Documentation initiated by:  Fuller Mandril  Subjective/Objective Assessment:   67 y.o. male with a past medical history of hypertension, diabetes mellitus, morbid obesity, presenting to the emergency department this morning with complaints of shortness of breath. / home with spouse     Action/Plan:   IV diureses; cardiac cath//home with [facility]   Anticipated DC Date:  08/31/2013   Anticipated DC Plan:  [place]  CM consult      [place]   Choice offered to / List presented to:  C-1 Patient        [place] arranged  HH-1 RN  [place].   Status of service:  Completed, signed off Medicare Important Message given?   (If response is "NO", the following Medicare IM given date fields will be blank) Date Medicare IM given:   Date Additional Medicare IM given:    Discharge Disposition:  [place]  Per UR Regulation:  Reviewed for med. necessity/level of care/duration of stay  If discussed at [place] of Stay Meetings, dates discussed:    Comments:   08-31-13 [place], RN, BSN [phone] [facility] orders faxed to [facility] 08-31-13. No further needs from CM at this time.  08/30/13 [place], RN, BSN, [employer] [phone] Spoke with pt at bedside regarding discharge planning for [facility]. Offered pt list of home health agencies to choose from.  Pt chose [facility] to render services of RN of disease management. Janae Sauce, RN of [facility] notified.  No DME needs identified at this time.

## 2013-08-30 NOTE — Progress Notes (Signed)
PT Cancellation Note  Patient Details Name: XZAYDEN LONGHURST MRN: [id number] DOB: 17-Feb-1946   Cancelled Treatment:    Reason Eval/Treat Not Completed: Patient at procedure or test/unavailable Patient had cardiac cath today.  Will return in am for PT evaluation.   Despina Pole 08/30/2013, 5:51 PM Carita Pian. Sanjuana Kava, [place] Pager [phone]

## 2013-08-30 NOTE — Progress Notes (Signed)
Subjective:  Dyspnea is improved after good diuresis and weight loss yesterday.  No chest pain. Awaiting cath later today.  Objective:  Vital Signs in the last 24 hours: Temp:  [97.4 F (36.3 C)-98 F (36.7 C)] 98 F (36.7 C) (11/14 0509) Pulse Rate:  [66-77] 77 (11/14 0509) Resp:  [18-20] 18 (11/14 0509) BP: (135-148)/(74-85) 146/83 mmHg (11/14 0509) SpO2:  [95 %-98 %] 98 % (11/14 0509) Weight:  [362 lb 6.4 oz (164.384 kg)] 362 lb 6.4 oz (164.384 kg) (11/14 0509)  Intake/Output from previous day: 11/13 0701 - 11/14 0700 In: 1364 [P.O.:1364] Out: 2805 [Urine:2805] Intake/Output from this shift:    . aspirin  325 mg Oral Daily  . diazepam  5 mg Oral On Call  . docusate sodium  100 mg Oral BID  . enoxaparin (LOVENOX) injection  85 mg Subcutaneous Q24H  . furosemide  80 mg Intravenous BID  . insulin aspart  0-15 Units Subcutaneous TID WC  . insulin aspart  0-5 Units Subcutaneous QHS  . irbesartan  300 mg Oral Daily  . metoprolol tartrate  50 mg Oral BID  . multivitamin with minerals  1 tablet Oral Daily  . potassium chloride  20 mEq Oral TID  . sodium chloride  3 mL Intravenous Q12H  . spironolactone  12.5 mg Oral Daily   . sodium chloride      Physical Exam: The patient appears to be in no distress.  Head and neck exam reveals that the pupils are equal and reactive.  The extraocular movements are full.  There is no scleral icterus.  Mouth and pharynx are benign.  No lymphadenopathy.  No carotid bruits.  The jugular venous pressure is normal.  Thyroid is not enlarged or tender.  Chest clear.  Heart reveals no abnormal lift or heave.  First and second heart sounds are normal.  There is no murmur gallop rub or click. Pulse regular.  The abdomen is markedly obese.  Extremities reveal mild edema. Neurologic exam is normal strength and no lateralizing weakness.  No sensory deficits.  Integument reveals Stasis dermatitis.  Lab Results:  Recent Labs   08/27/13 1445 08/29/13 0505  WBC 9.5 8.7  HGB 14.8 15.0  PLT 232 223    Recent Labs  08/29/13 0505 08/30/13 0440  NA 138 141  K 3.5 3.8  CL 99 100  CO2 27 25  GLUCOSE 98 102*  BUN 20 21  CREATININE 0.72 0.73    Recent Labs  08/27/13 1915 08/28/13 0032  TROPONINI <0.30 <0.30   Hepatic Function Panel  Recent Labs  08/28/13 0558  PROT 6.9  ALBUMIN 3.5  AST 19  ALT 27  ALKPHOS 69  BILITOT 0.7    Recent Labs  08/28/13 0032  CHOL 174   No results found for this basename: PROTIME,  in the last 72 hours  Imaging: 2D echo: Study Conclusions  - Left ventricle: The cavity size was mildly dilated. Wall thickness was increased in a pattern of mild LVH. Systolic function was moderately reduced. The estimated ejection fraction was in the range of 35% to 40%. Diffuse hypokinesis. - Left atrium: The atrium was moderately to severely dilated.  Cardiac Studies: Telemetry NSR with occ PVCs Assessment/Plan:  1.        Acute decompensated CHF: EF is 35-40%. 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 Will plan for right and left heart cath today. 2. Hyperlipidemia: borderline in May. Repeat fasting lipids 3. Malignant hypertension.  Improved after diuresis. Marland Kitchen 4. Acute hypoxemic respiratory failure: requiring CPAP in the emergency department. Likely secondary to acute decompensated congestive heart failure. Patient now stabilized, significant improvement with IV Lasix. Will monitor closely.  Plan: Cath today. Switch to oral lasix.   LOS: 3 days    Darlin Coco 08/30/2013, 8:19 AM

## 2013-08-30 NOTE — Progress Notes (Signed)
[facility] PROGRESS NOTE  Filed Weights   08/28/13 0639 08/29/13 0500 08/30/13 0509  Weight: 171.5 kg (378 lb 1.4 oz) 166.5 kg (367 lb 1.1 oz) 164.384 kg (362 lb 6.4 oz)        Intake/Output Summary (Last 24 hours) at 08/30/13 1018 Last data filed at 08/30/13 0843  Gross per 24 hour  Intake   1124 ml  Output   2755 ml  Net  -1631 ml     Assessment/Plan:  Acute respiratory failure due to  Acute systolic CHF (congestive heart failure), NYHA class 2: - off bipap. - On lasix oral, ARB, bet-blockers and aldactone. - good diuresis negative about 4.0L.  - Bp mildly high. Fluid restricit - estimated dry weight around 350 lb.    Malignant hypertension - Improved controlled. - Cont Lasix, aldactone, ARB and metoprolol.     Code Status: Full  Family Communication: wife, children bedside  Disposition Plan: inpatient  Consultants:  Cardiology Procedures:  none  Procedures: ECHO: estimated ejection fraction was in the range of 35% to 40%   Antibiotics:  none   HPI/Subjective: none  Objective: Filed Vitals:   08/29/13 1446 08/29/13 2100 08/30/13 0509 08/30/13 0931  BP: 142/74 135/76 146/83 141/77  Pulse: 66 70 77 77  Temp: 97.4 F (36.3 C) 97.5 F (36.4 C) 98 F (36.7 C) 97.6 F (36.4 C)  TempSrc: Oral Oral Oral Oral  Resp: 18 20 18    Height:      Weight:   164.384 kg (362 lb 6.4 oz)   SpO2: 97% 95% 98% 98%     Exam:  General: Alert, awake, oriented x3, in no acute distress.  HEENT: No bruits, no goiter.  Heart: Regular rate and rhythm, lower extremity edema. Lungs: Good air movement, bilateral air movement.  Abdomen: Soft, nontender, nondistended, positive bowel sounds.  Neuro: Grossly intact, nonfocal.   Data Reviewed: Basic Metabolic Panel:  Recent Labs Lab 08/27/13 0825 08/27/13 1445 08/28/13 0558 08/29/13 0505 08/30/13 0440  NA 138  --  138 138 141  K 4.6  --  3.8 3.5 3.8  CL 102  --  101 99 100  CO2 25  --  26 27 25   GLUCOSE  129*  --  114* 98 102*  BUN 21  --  18 20 21   CREATININE 0.81 0.82 0.75 0.72 0.73  CALCIUM 9.0  --  9.0 9.3 8.9   Liver Function Tests:  Recent Labs Lab 08/27/13 0825 08/28/13 0558  AST 36 19  ALT 31 27  ALKPHOS 70 69  BILITOT 0.3 0.7  PROT 7.0 6.9  ALBUMIN 3.6 3.5   No results found for this basename: LIPASE, AMYLASE,  in the last 168 hours No results found for this basename: AMMONIA,  in the last 168 hours CBC:  Recent Labs Lab 08/27/13 0825 08/27/13 1445 08/29/13 0505  WBC 9.9 9.5 8.7  NEUTROABS 7.6  --   --   HGB 14.8 14.8 15.0  HCT 44.9 44.2 44.4  MCV 86.7 87.0 85.2  PLT 238 232 223   Cardiac Enzymes:  Recent Labs Lab 08/27/13 1405 08/27/13 1915 08/28/13 0032  TROPONINI <0.30 <0.30 <0.30   BNP (last 3 results)  Recent Labs  08/27/13 0825  PROBNP 1406.0*   CBG:  Recent Labs Lab 08/29/13 0614 08/29/13 1129 08/29/13 1622 08/29/13 2106 08/30/13 0553  GLUCAP 101* 117* 97 99 124*    No results found for this or any previous visit (from the past 240 hour(s)).  Studies: No results found.  Scheduled Meds: . aspirin  325 mg Oral Daily  . diazepam  5 mg Oral On Call  . docusate sodium  100 mg Oral BID  . enoxaparin (LOVENOX) injection  85 mg Subcutaneous Q24H  . furosemide  80 mg Oral BID  . insulin aspart  0-15 Units Subcutaneous TID WC  . insulin aspart  0-5 Units Subcutaneous QHS  . irbesartan  300 mg Oral Daily  . metoprolol tartrate  50 mg Oral BID  . multivitamin with minerals  1 tablet Oral Daily  . potassium chloride  20 mEq Oral TID  . sodium chloride  3 mL Intravenous Q12H  . spironolactone  12.5 mg Oral Daily   Continuous Infusions: . sodium chloride       Charlynne Cousins  [facility] Pager [phone]. If 8PM-8AM, please contact night-coverage at [email or web address], password [email or web address] 08/30/2013, 10:18 AM  LOS: 3 days

## 2013-08-31 DIAGNOSIS — I5023 Acute on chronic systolic (congestive) heart failure: Secondary | ICD-10-CM | POA: Diagnosis not present

## 2013-08-31 DIAGNOSIS — J9600 Acute respiratory failure, unspecified whether with hypoxia or hypercapnia: Secondary | ICD-10-CM | POA: Diagnosis not present

## 2013-08-31 DIAGNOSIS — I10 Essential (primary) hypertension: Secondary | ICD-10-CM | POA: Diagnosis not present

## 2013-08-31 DIAGNOSIS — I509 Heart failure, unspecified: Secondary | ICD-10-CM | POA: Diagnosis not present

## 2013-08-31 DIAGNOSIS — G4733 Obstructive sleep apnea (adult) (pediatric): Secondary | ICD-10-CM | POA: Diagnosis present

## 2013-08-31 LAB — BASIC METABOLIC PANEL
BUN: 18 mg/dL (ref 6–23)
Calcium: 9.2 mg/dL (ref 8.4–10.5)
Chloride: 98 mEq/L (ref 96–112)
GFR calc Af Amer: >90 mL/min (ref >90)
GFR calc non Af Amer: >90 mL/min (ref >90)
Potassium: 3.9 mEq/L (ref 3.5–5.1)
Sodium: 136 mEq/L (ref 135–145)

## 2013-08-31 LAB — GLUCOSE, CAPILLARY
Glucose-Capillary: 101 mg/dL — ABNORMAL HIGH (ref 70–99)
Glucose-Capillary: 115 mg/dL — ABNORMAL HIGH (ref 70–99)
Glucose-Capillary: 110 mg/dL — ABNORMAL HIGH (ref 70–99)

## 2013-08-31 LAB — PRO B NATRIURETIC PEPTIDE: Pro B Natriuretic peptide (BNP): 520.1 pg/mL — ABNORMAL HIGH (ref 0–125)

## 2013-08-31 LAB — CBC
HCT: 44.5 % (ref 39.0–52.0)
Hemoglobin: 14.4 g/dL (ref 13.0–17.0)
MCHC: 32.4 g/dL (ref 30.0–36.0)
Platelets: 200 10*3/uL (ref 150–400)
RDW: 14.1 % (ref 11.5–15.5)
WBC: 8 10*3/uL (ref 4.0–10.5)

## 2013-08-31 MED ORDER — METOPROLOL TARTRATE 100 MG PO TABS
100.0000 mg | ORAL_TABLET | Freq: Two times a day (BID) | ORAL | Status: DC
  Filled 2013-08-31: qty 1

## 2013-08-31 MED ORDER — FUROSEMIDE 10 MG/ML IJ SOLN
80.0000 mg | Freq: Once | INTRAMUSCULAR | Status: AC
  Administered 2013-08-31: 80 mg via INTRAVENOUS

## 2013-08-31 MED ORDER — FUROSEMIDE 20 MG PO TABS: 60.0000 mg | ORAL_TABLET | Freq: Two times a day (BID) | ORAL | Status: DC

## 2013-08-31 MED ORDER — POTASSIUM CHLORIDE CRYS ER 20 MEQ PO TBCR: 20.0000 meq | EXTENDED_RELEASE_TABLET | Freq: Every day | ORAL | Status: DC

## 2013-08-31 MED ORDER — FUROSEMIDE 40 MG PO TABS
60.0000 mg | ORAL_TABLET | Freq: Two times a day (BID) | ORAL | Status: DC
  Filled 2013-08-31 (×2): qty 1

## 2013-08-31 MED ORDER — SPIRONOLACTONE 25 MG PO TABS: 25.0000 mg | ORAL_TABLET | Freq: Every day | ORAL | Status: DC

## 2013-08-31 NOTE — Progress Notes (Signed)
PT Cancellation Note  Patient Details Name: [name] MRN: [id number] DOB: 18-Nov-1945   Cancelled Treatment:    Reason Eval/Treat Not Completed: Other (comment) (Pt independent with mobility; CHF exercise handout given).  No PT needs.  Thanks.   INGOLD,Markell Schrier 08/31/2013, 12:58 PM  [name] [phone] (pager)

## 2013-08-31 NOTE — Discharge Summary (Signed)
Physician Discharge Summary  ORAS SPLITT [id number] DOB: [birth year] DOA: 08/27/2013  PCP: [name], MD  Admit date: 08/27/2013 Discharge date: 08/31/2013  Time spent: 35 minutes  Recommendations for Outpatient Follow-up:  1. Cardiology Dr. Mare Ferrari in 1 week. 2. PCP in 2 weeks.  BNP    Component Value Date/Time   PROBNP 1406.0* 08/27/2013 0825   Filed Weights   08/29/13 0500 08/30/13 0509 08/31/13 0628  Weight: 166.5 kg (367 lb 1.1 oz) 164.384 kg (362 lb 6.4 oz) 163.8 kg (361 lb 1.8 oz)     Discharge Diagnoses:  Active Problems:   NON-HODGKIN'S LYMPHOMA   Acute systolic CHF (congestive heart failure), NYHA class 2   Malignant hypertension   Acute respiratory failure   OSA (obstructive sleep apnea)   Discharge Condition: stable  Diet recommendation: Low sodium fluid restricted diet.    History of present illness:  67 y.o. male with a past medical history of hypertension, diabetes mellitus, morbid obesity, presenting to the emergency department this morning with complaints of shortness of breath. He states that symptoms started this morning, noting worsening shortness of breath with associated chest discomfort across his chest. He denied prior symptoms in the past. He did not report cough, fevers, chills, syncope, presyncope, nausea, vomiting, abdominal pain or pain elsewhere in his body. Patient denies a cardiac history, and reports never having undergone cardiac workups. He does report being on Lasix therapy for lower extremity swelling. In the emergency room he was found to be in acute respiratory failure, requiring CPAP. He was also found to be hypertensive with a blood pressure of 230/130. In the emergency room he was started on a nitro drip and administered 40 mg of IV Lasix as symptoms were suspected to be secondary to acute CHF   Hospital Course:  Acute respiratory failure due to Acute systolic CHF (congestive heart failure), NYHA class 2:  -  Initially admitted and started on bipap, NTG  and IV lasix. saturation improved. - Diuresis aggressively and was > 6.0 L negative. - Echo done that showed 11.12.2014: estimated ejection fraction was in the range of 35% to 40%. Diffuse Hypokinesis. 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 - cardiology recommended medical management. - Lasix change to oral, ARB, bet-blockers and aldactone.  - Fluid restriction and low sodium diet as an outpatient. - Estimated dry weight around 355 lb.   Malignant hypertension  - Had to be placed on IV NTG and titrate medication up. - On Lasix, aldactone, ARB and metoprolol. His Bp has improved to < 130/90, will need further titration as an outpatient.   Procedures: Echo 11.13.2014: estimated ejection fraction was in the range of 35% to 40%. Diffuse Hypokinesis. Cardiac cath 11.14.2014: Non Ischemic cardiomyopathy.   Consultations:  cardiology  Discharge Exam: Filed Vitals:   08/31/13 1031  BP: 130/73  Pulse: 78  Temp: 97.8 F (36.6 C)  Resp: 20    General: A&O x3 Cardiovascular: RRR Respiratory: good air movement CTA B/L  Discharge Instructions      Discharge Orders   Future Appointments Provider Department Dept Phone   09/10/2013 3:00 PM [place], [place] [phone]   Future Orders Complete By Expires   Diet - low sodium heart healthy  As directed    Increase activity slowly  As directed        Medication List    STOP taking these medications       ibuprofen 200 MG tablet  Commonly known as:  ADVIL,MOTRIN      TAKE these medications       aspirin 81 MG tablet  Take 81 mg by mouth daily.     furosemide 20 MG tablet  Commonly known as:  LASIX  Take 3 tablets (60 mg total) by mouth 2 (two) times daily.     metFORMIN 500 MG tablet  Commonly known as:  GLUCOPHAGE  Take 1 tablet (500 mg total) by mouth 2 (two) times daily with a meal.     metoprolol  200 MG 24 hr tablet  Commonly known as:  TOPROL-XL  take 1 tablet by mouth once daily     multivitamin with minerals Tabs tablet  Take 1 tablet by mouth daily.     olmesartan 40 MG tablet  Commonly known as:  BENICAR  Take 40 mg by mouth daily.     potassium chloride SA 20 MEQ tablet  Commonly known as:  K-DUR,KLOR-CON  Take 1 tablet (20 mEq total) by mouth daily.  Start taking on:  09/01/2013     sildenafil 100 MG tablet  Commonly known as:  VIAGRA  Take 0.5-1 tablets (50-100 mg total) by mouth daily as needed for erectile dysfunction.     spironolactone 25 MG tablet  Commonly known as:  ALDACTONE  Take 1 tablet (25 mg total) by mouth daily.       No Known Allergies Follow-up Information   Follow up with Darlin Coco, MD In 1 week. (hospital follow up)    Specialty:  Cardiology   Contact information:   [address] [place] [address] [phone]        The results of significant diagnostics from this hospitalization (including imaging, microbiology, ancillary and laboratory) are listed below for reference.    Significant Diagnostic Studies: Dg Chest Portable 1 View  08/27/2013   CLINICAL DATA:  Severe shortness of breath.  EXAM: PORTABLE CHEST - 1 VIEW  COMPARISON:  07/05/2010  FINDINGS: The cardiac silhouette appears enlarged for portable technique. There is marked diffuse pulmonary vascular congestion. There is diffuse bilateral interstitial prominence, with probable early airspace disease at the lung bases bilaterally. No visible pleural effusion by portable technique. No acute osseous abnormality identified.  IMPRESSION: Congestive heart failure pattern.   Electronically Signed   By: Curlene Dolphin M.D.   On: 08/27/2013 08:30    Microbiology: No results found for this or any previous visit (from the past 240 hour(s)).   Labs: Basic Metabolic Panel:  Recent Labs Lab 08/27/13 0825 08/27/13 1445 08/28/13 0558 08/29/13 0505  08/30/13 0440 08/31/13 0530  NA 138  --  138 138 141 136  K 4.6  --  3.8 3.5 3.8 3.9  CL 102  --  101 99 100 98  CO2 25  --  26 27 25 27   GLUCOSE 129*  --  114* 98 102* 105*  BUN 21  --  18 20 21 18   CREATININE 0.81 0.82 0.75 0.72 0.73 0.76  CALCIUM 9.0  --  9.0 9.3 8.9 9.2   Liver Function Tests:  Recent Labs Lab 08/27/13 0825 08/28/13 0558  AST 36 19  ALT 31 27  ALKPHOS 70 69  BILITOT 0.3 0.7  PROT 7.0 6.9  ALBUMIN 3.6 3.5   No results found for this basename: LIPASE, AMYLASE,  in the last 168 hours No results found for this basename: AMMONIA,  in the last 168 hours CBC:  Recent Labs Lab 08/27/13 0825 08/27/13  1445 08/29/13 0505 08/31/13 0530  WBC 9.9 9.5 8.7 8.0  NEUTROABS 7.6  --   --   --   HGB 14.8 14.8 15.0 14.4  HCT 44.9 44.2 44.4 44.5  MCV 86.7 87.0 85.2 84.8  PLT 238 232 223 200   Cardiac Enzymes:  Recent Labs Lab 08/27/13 1405 08/27/13 1915 08/28/13 0032  TROPONINI <0.30 <0.30 <0.30   BNP: BNP (last 3 results)  Recent Labs  08/27/13 0825  PROBNP 1406.0*   CBG:  Recent Labs Lab 08/30/13 0553 08/30/13 1056 08/30/13 1602 08/30/13 2114 08/31/13 0553  GLUCAP 124* 107* 125* 101* 115*       Signed:  Charlynne Cousins  [facility] 08/31/2013, 11:20 AM

## 2013-08-31 NOTE — Progress Notes (Signed)
1400 discharge instructions ginven to spouse and pt . Both verbalized understanding

## 2013-08-31 NOTE — Progress Notes (Signed)
Agree 

## 2013-08-31 NOTE — Progress Notes (Signed)
Pt. Sitting up in chair alert oriented x4. VSS

## 2013-08-31 NOTE — Progress Notes (Signed)
Subjective:  Feels well. No dyspnea. Appears euvolemic. Right groin okay.  Objective:  Vital Signs in the last 24 hours: Temp:  [97.3 F (36.3 C)-98 F (36.7 C)] 97.8 F (36.6 C) (11/15 1031) Pulse Rate:  [62-80] 78 (11/15 1031) Resp:  [18-24] 20 (11/15 1031) BP: (130-156)/(65-103) 130/73 mmHg (11/15 1031) SpO2:  [92 %-99 %] 97 % (11/15 1031) Weight:  [361 lb 1.8 oz (163.8 kg)] 361 lb 1.8 oz (163.8 kg) (11/15 0628)  Intake/Output from previous day: 11/14 0701 - 11/15 0700 In: 960 [P.O.:960] Out: 2975 [Urine:2975] Intake/Output from this shift: Total I/O In: 120 [P.O.:120] Out: 400 [Urine:400]  . aspirin  81 mg Oral Daily  . docusate sodium  100 mg Oral BID  . enoxaparin (LOVENOX) injection  85 mg Subcutaneous Q24H  . furosemide  60 mg Oral BID  . insulin aspart  0-15 Units Subcutaneous TID WC  . insulin aspart  0-5 Units Subcutaneous QHS  . irbesartan  300 mg Oral Daily  . metoprolol tartrate  100 mg Oral BID  . multivitamin with minerals  1 tablet Oral Daily  . [START ON 09/01/2013] potassium chloride  20 mEq Oral Daily  . spironolactone  25 mg Oral Daily      Physical Exam: The patient appears to be in no distress.  Head and neck exam reveals that the pupils are equal and reactive.  The extraocular movements are full.  There is no scleral icterus.  Mouth and pharynx are benign.  No lymphadenopathy.  No carotid bruits.  The jugular venous pressure is normal.  Thyroid is not enlarged or tender.  Chest clear.  Heart reveals no abnormal lift or heave.  First and second heart sounds are normal.  There is no murmur gallop rub or click. Pulse regular.  The abdomen is markedly obese. Groin no hematoma.  Extremities reveal edema has resolved. Neurologic exam is normal strength and no lateralizing weakness.  No sensory deficits.  Integument reveals Stasis dermatitis.  Lab Results:  Recent Labs  08/29/13 0505 08/31/13 0530  WBC 8.7 8.0  HGB 15.0 14.4  PLT  223 200    Recent Labs  08/30/13 0440 08/31/13 0530  NA 141 136  K 3.8 3.9  CL 100 98  CO2 25 27  GLUCOSE 102* 105*  BUN 21 18  CREATININE 0.73 0.76   No results found for this basename: TROPONINI, CK, MB,  in the last 72 hours Hepatic Function Panel No results found for this basename: PROT, ALBUMIN, AST, ALT, ALKPHOS, BILITOT, BILIDIR, IBILI,  in the last 72 hours No results found for this basename: CHOL,  in the last 72 hours No results found for this basename: PROTIME,  in the last 72 hours  Imaging: 2D echo: Study Conclusions  - Left ventricle: The cavity size was mildly dilated. Wall thickness was increased in a pattern of mild LVH. Systolic function was moderately reduced. The estimated ejection fraction was in the range of 35% to 40%. Diffuse hypokinesis. - Left atrium: The atrium was moderately to severely dilated.  Cardiac Studies: Telemetry NSR with occ PVCs Assessment/Plan:  1.        Acute decompensated CHF: EF is 35-40%. 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 Cath showed normal coronaries. 2. Hyperlipidemia: borderline in May. Repeat fasting lipids 3. Malignant hypertension. Improved after diuresis. . Acute hypoxemic respiratory failure: Patient appears to have sleep apnea.     Plan: okay for discharge today. He will see his  PCP about obtaining CPAP machine.   LOS: 4 days    Darlin Coco 08/31/2013, 12:18 PM

## 2013-09-04 ENCOUNTER — Ambulatory Visit (INDEPENDENT_AMBULATORY_CARE_PROVIDER_SITE_OTHER): Payer: Medicare Other | Admitting: Family Medicine

## 2013-09-04 ENCOUNTER — Encounter: Payer: Self-pay | Admitting: Family Medicine

## 2013-09-04 VITALS — BP 120/68 | HR 66 | Temp 97.6°F | Wt 357.0 lb

## 2013-09-04 DIAGNOSIS — I10 Essential (primary) hypertension: Secondary | ICD-10-CM

## 2013-09-04 DIAGNOSIS — Z23 Encounter for immunization: Secondary | ICD-10-CM | POA: Diagnosis not present

## 2013-09-04 DIAGNOSIS — G4733 Obstructive sleep apnea (adult) (pediatric): Secondary | ICD-10-CM | POA: Diagnosis not present

## 2013-09-04 DIAGNOSIS — R609 Edema, unspecified: Secondary | ICD-10-CM | POA: Diagnosis not present

## 2013-09-04 DIAGNOSIS — I509 Heart failure, unspecified: Secondary | ICD-10-CM

## 2013-09-04 LAB — BASIC METABOLIC PANEL
BUN: 40 mg/dL — ABNORMAL HIGH (ref 6–23)
Creatinine, Ser: 1.4 mg/dL (ref 0.4–1.5)
GFR: 54.12 mL/min — ABNORMAL LOW (ref >60.00)
Glucose, Bld: 114 mg/dL — ABNORMAL HIGH (ref 70–99)

## 2013-09-04 MED ORDER — FUROSEMIDE 20 MG PO TABS: 40.0000 mg | ORAL_TABLET | Freq: Two times a day (BID) | ORAL | Status: DC

## 2013-09-04 NOTE — Progress Notes (Signed)
Pre visit review using our clinic review tool, if applicable. No additional management support is needed unless otherwise documented below in the visit note. 

## 2013-09-04 NOTE — Progress Notes (Signed)
  Subjective:    Patient ID: [name], male    DOB: Feb 16, 1946, 67 y.o.   MRN: [id number]  HPI Here to follow up a hospital stay from 08-27-13 to 08-31-13 for new onset CHF. His EF was 35-40% and his ECHO showed global hypocontractility. He had a heart cath which showed no ischemic disease. He was aggressively diuresed and did well. He was on Bipap for a time. He is now sodium and fluid restricted. He is taking Lasix 60 mg bid and Spironolactone. He feels much better and denies any SOB or chest pain. He still has some generalized fatigue. His leg edema has greatly improved. His BP was quite high on admission but is stable now. His diabetes has been very well controlled. In fact his A1c on admission was 6.0.    Review of Systems  Constitutional: Positive for fatigue.  Respiratory: Negative.   Cardiovascular: Positive for leg swelling. Negative for chest pain and palpitations.       Objective:   Physical Exam  Constitutional: He is oriented to person, place, and time. He appears well-developed and well-nourished.  Cardiovascular: Normal rate, regular rhythm, normal heart sounds and intact distal pulses.   Pulmonary/Chest: Effort normal and breath sounds normal. No respiratory distress. He has no wheezes. He has no rales.  Musculoskeletal:  2+ edema to both lower legs   Neurological: He is alert and oriented to person, place, and time.          Assessment & Plan:  He is doing well after treatment for acute CHF which is non-ischemic. We will decrease the Lasix to 40 mg bid. Check a BMET today for potassium level and renal function. Given a flu shot and a Prevnar. We will refer for a sleep apnea evaluation. He will see Dr. Mare Ferrari next week

## 2013-09-04 NOTE — Addendum Note (Signed)
Addended by: Aggie Hacker A on: 09/04/2013 12:26 PM   Modules accepted: Orders

## 2013-09-09 ENCOUNTER — Ambulatory Visit (INDEPENDENT_AMBULATORY_CARE_PROVIDER_SITE_OTHER): Payer: Medicare Other | Admitting: Physician Assistant

## 2013-09-09 ENCOUNTER — Encounter: Payer: Self-pay | Admitting: Physician Assistant

## 2013-09-09 ENCOUNTER — Other Ambulatory Visit: Payer: Self-pay

## 2013-09-09 VITALS — BP 122/62 | HR 64 | Ht 75.0 in | Wt 359.0 lb

## 2013-09-09 DIAGNOSIS — I428 Other cardiomyopathies: Secondary | ICD-10-CM

## 2013-09-09 DIAGNOSIS — I5022 Chronic systolic (congestive) heart failure: Secondary | ICD-10-CM

## 2013-09-09 DIAGNOSIS — I10 Essential (primary) hypertension: Secondary | ICD-10-CM

## 2013-09-09 DIAGNOSIS — E785 Hyperlipidemia, unspecified: Secondary | ICD-10-CM

## 2013-09-09 DIAGNOSIS — G4733 Obstructive sleep apnea (adult) (pediatric): Secondary | ICD-10-CM

## 2013-09-09 DIAGNOSIS — G4730 Sleep apnea, unspecified: Secondary | ICD-10-CM | POA: Insufficient documentation

## 2013-09-09 LAB — BASIC METABOLIC PANEL
Calcium: 9.5 mg/dL (ref 8.4–10.5)
Creatinine, Ser: 1.2 mg/dL (ref 0.4–1.5)
GFR: 63.51 mL/min (ref >60.00)
Sodium: 136 mEq/L (ref 135–145)

## 2013-09-09 NOTE — Patient Instructions (Signed)
Your physician recommends that you continue on your current medications as directed. Please refer to the Current Medication list given to you today.  Your physician recommends that you go to the lab for a BMET panel today  Your physician recommends that you schedule a follow-up appointment in: 6-8 Weeks with Dr. Johnsie Cancel

## 2013-09-09 NOTE — Progress Notes (Signed)
[address] [place], [place]  [address] Phone: [phone] Fax:  [phone]  Date:  09/09/2013   ID:  [name], DOB 01/28/1946, MRN [id number]  PCP:  [name], MD  Cardiologist:  Dr. [name]     History of Present Illness: [name] is a 67 y.o. male with a history of T2DM, HTN, HL, morbid obesity, non-Hodgkin's lymphoma (in remission). He was admitted 11/11-11/15 with new onset acute systolic CHF in the setting of malignant hypertension.  His blood pressure was controlled initially with nitroglycerin drip and he was placed on BiPap and diuresed with IV Lasix.  He was found to have systolic dysfunction. Echo (08/28/13): Mild LVH, EF 35-40%, diffuse HK, moderate to severe LAE.  He underwent R/L Heart cath 08/30/13: RA mean 8, RV 30/0, PA 27/12, mean PCWP 9, CO 5.67, CI 1.98; normal coronary arteries.  He had significant desaturations during his catheterization suggesting sleep apnea. Outpatient CPAP is recommended. He planned to obtain this with his PCP.  Etiology of NICM is likely due to malignant HTN.    His breathing is much better. He denies chest pain. He sleeps at an incline chronically. He denies PND. He denies LE edema. He denies syncope. He does feel weak. He is somewhat better since his PCP reduced his Lasix dose last week in a follow up visit.  Recent Labs: 02/14/2013: Direct LDL 164.8; TSH 2.22  08/28/2013: ALT 27; HDL 36*; LDL (calc) 113*  08/31/2013: Hemoglobin 14.4; Pro B Natriuretic peptide (BNP) 520.1*  09/04/2013: Creatinine 1.4; Potassium 4.5   Wt Readings from Last 3 Encounters:  09/09/13 359 lb (162.841 kg)  09/04/13 357 lb (161.934 kg)  08/31/13 361 lb 1.8 oz (163.8 kg)     Past Medical History  Diagnosis Date  . Hypertension   . Diverticulitis   . Headache(784.0)   . Lymphoma, non Hodgkin's     sees Dr. [name]   . Diabetes mellitus without complication   . Morbid obesity   . Hyperlipidemia   . Chronic systolic  CHF (congestive heart failure)     a. Echo (08/28/13): Mild LVH, EF 35-40%, diffuse HK, moderate to severe LAE.  Marland Kitchen NICM (nonischemic cardiomyopathy)     a. R/L Heart cath 08/30/13: RA mean 8, RV 30/0, PA 27/12, mean PCWP 9, CO 5.67, CI 1.98; normal coronary arteries  . Sleep apnea     Current Outpatient Prescriptions  Medication Sig Dispense Refill  . aspirin 81 MG tablet Take 81 mg by mouth daily.      . furosemide (LASIX) 20 MG tablet Take 2 tablets (40 mg total) by mouth 2 (two) times daily.  30 tablet  0  . metFORMIN (GLUCOPHAGE) 500 MG tablet Take 1 tablet (500 mg total) by mouth 2 (two) times daily with a meal.  60 tablet  11  . metoprolol (TOPROL-XL) 200 MG 24 hr tablet take 1 tablet by mouth once daily  30 tablet  11  . Multiple Vitamin (MULTIVITAMIN WITH MINERALS) TABS tablet Take 1 tablet by mouth daily.      Marland Kitchen olmesartan (BENICAR) 40 MG tablet Take 40 mg by mouth daily.      . potassium chloride SA (K-DUR,KLOR-CON) 20 MEQ tablet Take 1 tablet (20 mEq total) by mouth daily.  30 tablet  0  . sildenafil (VIAGRA) 100 MG tablet Take 0.5-1 tablets (50-100 mg total) by mouth daily as needed for erectile dysfunction.  10 tablet  11  . spironolactone (ALDACTONE)  25 MG tablet Take 1 tablet (25 mg total) by mouth daily.  30 tablet  0   No current facility-administered medications for this visit.    Allergies:   Review of patient's allergies indicates no known allergies.   Social History:  The patient  reports that he quit smoking about 13 years ago. His smoking use included Cigarettes. He started smoking about 48 years ago. He has a 104 pack-year smoking history. He has never used smokeless tobacco. He reports that he drinks alcohol. He reports that he does not use illicit drugs.   Family History:  The patient's family history includes Cancer in his father; Diabetes in his father and paternal grandfather; Heart failure in his mother; Hypertension in his father and mother; Stroke in his  maternal grandmother.   ROS:  Please see the history of present illness.      All other systems reviewed and negative.   PHYSICAL EXAM: VS:  BP 122/62  Pulse 64  Ht 6\' 3"  (1.905 m)  Wt 359 lb (162.841 kg)  BMI 44.87 kg/m2 Well nourished, well developed, in no acute distress HEENT: normal Neck: no JVD Cardiac:  normal S1, S2; RRR; no murmur Lungs:  clear to auscultation bilaterally, no wheezing, rhonchi or rales Abd: soft, nontender, no hepatomegaly Ext: no edema; right groin without hematoma or bruit  Skin: warm and dry Neuro:  CNs 2-12 intact, no focal abnormalities noted  EKG:  NSR, HR 64, normal axis, nonspecific ST-T wave changes     ASSESSMENT AND PLAN:  1. Chronic Systolic CHF:  Volume stable. His BUN and creatinine were slightly increased last week when he saw his PCP. He has felt weak. I will check another basic metabolic panel today. If he remains prerenal, I'll reduce his Lasix further. I will also need to consider reducing his potassium dose as well as he is on spironolactone. 2. Non-Ischemic CM:  Continue beta blocker, ARB, spironolactone.  Consider follow up echocardiogram in 3-6 months. 3. Hypertension:  Controlled. 4. Sleep Apnea:  He has an appointment with pulmonology next month for evaluation. 5. Diabetes Mellitus:   Follow up with primary care. 6. Obesity:  We discussed the importance of weight loss. 7. Disposition:  F/u with Dr. Johnsie Cancel in 6-8 weeks.  Signed, Richardson Dopp, PA-C  09/09/2013 10:50 AM

## 2013-09-10 ENCOUNTER — Ambulatory Visit: Payer: Medicare Other | Admitting: *Deleted

## 2013-09-16 ENCOUNTER — Other Ambulatory Visit (INDEPENDENT_AMBULATORY_CARE_PROVIDER_SITE_OTHER): Payer: Medicare Other

## 2013-09-16 DIAGNOSIS — I5022 Chronic systolic (congestive) heart failure: Secondary | ICD-10-CM

## 2013-09-16 LAB — BASIC METABOLIC PANEL
Chloride: 102 mEq/L (ref 96–112)
Creatinine, Ser: 1.2 mg/dL (ref 0.4–1.5)
GFR: 62.91 mL/min (ref >60.00)
Potassium: 4.5 mEq/L (ref 3.5–5.1)
Sodium: 138 mEq/L (ref 135–145)

## 2013-09-18 ENCOUNTER — Encounter: Payer: Medicare Other | Attending: Family Medicine | Admitting: *Deleted

## 2013-09-18 DIAGNOSIS — E119 Type 2 diabetes mellitus without complications: Secondary | ICD-10-CM

## 2013-09-18 DIAGNOSIS — Z713 Dietary counseling and surveillance: Secondary | ICD-10-CM | POA: Diagnosis not present

## 2013-09-18 NOTE — Progress Notes (Signed)
  Patient was seen on 09/18/13 for their 3 month follow-up as a part of the diabetes self-management courses at the [facility]. The following learning objectives were met by your patient during this course:  Patient self reports the following: A1c improved from 6.5% on 03/19/13 down to 6.0% on 08/27/13 Weight has dropped from 395 # on 04/02/13 now down to 356.4 # today indicating a 38.6 # with loss in 4 months!  Diabetes control has improved since diabetes self-management training: YES Number of days blood glucose is >200: UNKNOWN, NOT TESTING BG BUT A1C IMPROVED Last MD appointment for diabetes: LAST MONTH Changes in treatment plan: NOT FOR DIABETES, BUT IS ON SODIUM RESTRICTION FOR CHF Confidence with ability to manage diabetes: BETTER Areas for improvement with diabetes self-care: DOING WELL WITH DIABETES NOW Willingness to participate in diabetes support group: NOT AT THIS TIME  Your patient has established the following 4 month goals for diabetes self-care:  Increase physical activity at least 3 days/week - NOW THAT CHF WAS DIAGNOSED AND TREATED HE CAN EXERCISE MORE Take diabetes medications as scheduled - DOING WELL WITH MEDICATION SCHEDULE Your patient has identified these potential barriers to change:  Handling stress - HIS FAMILY IS HIS BLESSING, USE OF HUMOR TOO Making time for exercise - STILL WORKING ON THIS BARRIER Your patient has identified their diabetes self-care support plan as:  family  Follow-Up Plan: Patient is eligible for a "free" 30 minute diabetes self-care appointment in the next year. Patient to call and schedule as needed.

## 2013-10-11 ENCOUNTER — Institutional Professional Consult (permissible substitution): Payer: Medicare Other | Admitting: Pulmonary Disease

## 2013-10-12 ENCOUNTER — Encounter: Payer: Self-pay | Admitting: Family Medicine

## 2013-10-12 ENCOUNTER — Ambulatory Visit (INDEPENDENT_AMBULATORY_CARE_PROVIDER_SITE_OTHER): Payer: Medicare Other | Admitting: Family Medicine

## 2013-10-12 VITALS — BP 124/68 | Temp 98.0°F | Wt 355.0 lb

## 2013-10-12 DIAGNOSIS — J209 Acute bronchitis, unspecified: Secondary | ICD-10-CM | POA: Diagnosis not present

## 2013-10-12 MED ORDER — AZITHROMYCIN 250 MG PO TABS: ORAL_TABLET | ORAL | Status: DC

## 2013-10-12 MED ORDER — HYDROCODONE-HOMATROPINE 5-1.5 MG/5ML PO SYRP: 5.0000 mL | ORAL_SOLUTION | ORAL | Status: DC | PRN

## 2013-10-12 NOTE — Progress Notes (Signed)
   Subjective:    Patient ID: [name], male    DOB: 20-Jan-1946, 67 y.o.   MRN: [id number]  HPI Here for 3 days of fever, body aches, and a dry cough. No NVD. Using Motrin.    Review of Systems  Constitutional: Positive for fever and fatigue.  HENT: Positive for congestion. Negative for sinus pressure.   Eyes: Negative.   Respiratory: Positive for cough. Negative for shortness of breath and wheezing.   Gastrointestinal: Negative.        Objective:   Physical Exam  Constitutional: He appears well-developed and well-nourished. No distress.  HENT:  Right Ear: External ear normal.  Left Ear: External ear normal.  Nose: Nose normal.  Mouth/Throat: Oropharynx is clear and moist.  Eyes: Conjunctivae are normal.  Pulmonary/Chest: Effort normal. No respiratory distress. He has no wheezes. He has no rales.  Rhonchi throughout   Lymphadenopathy:    He has no cervical adenopathy.          Assessment & Plan:  Early bronchitis secondary to influenza. Treat with a Zpack.

## 2013-10-12 NOTE — Progress Notes (Signed)
Pre visit review using our clinic review tool, if applicable. No additional management support is needed unless otherwise documented below in the visit note. 

## 2013-10-18 ENCOUNTER — Other Ambulatory Visit (HOSPITAL_COMMUNITY): Payer: Self-pay | Admitting: Family Medicine

## 2013-11-06 ENCOUNTER — Ambulatory Visit (INDEPENDENT_AMBULATORY_CARE_PROVIDER_SITE_OTHER): Payer: Medicare Other | Admitting: Cardiovascular Disease

## 2013-11-06 ENCOUNTER — Encounter: Payer: Self-pay | Admitting: Cardiovascular Disease

## 2013-11-06 VITALS — BP 128/72 | HR 64 | Ht 76.0 in | Wt 343.0 lb

## 2013-11-06 DIAGNOSIS — G4730 Sleep apnea, unspecified: Secondary | ICD-10-CM | POA: Diagnosis not present

## 2013-11-06 DIAGNOSIS — I10 Essential (primary) hypertension: Secondary | ICD-10-CM | POA: Diagnosis not present

## 2013-11-06 DIAGNOSIS — I5022 Chronic systolic (congestive) heart failure: Secondary | ICD-10-CM | POA: Diagnosis not present

## 2013-11-06 LAB — BASIC METABOLIC PANEL
BUN: 23 mg/dL (ref 6–23)
CO2: 28 mEq/L (ref 19–32)
CREATININE: 1.1 mg/dL (ref 0.4–1.5)
Calcium: 9.5 mg/dL (ref 8.4–10.5)
Chloride: 102 mEq/L (ref 96–112)
GFR: 72.38 mL/min (ref >60.00)
GLUCOSE: 103 mg/dL — AB (ref 70–99)
POTASSIUM: 4.4 meq/L (ref 3.5–5.1)
Sodium: 139 mEq/L (ref 135–145)

## 2013-11-06 NOTE — Progress Notes (Signed)
Patient ID: [name], male   DOB: 11-Oct-1946, 68 y.o.   MRN: [id number] [name] is a 68 y.o. male with a history of non-hodgkin's lymphoma (diagnosed in 2008 and in remission), htn, DM, hyperlipidemia, morbid obesity and no prior cardiac history who presented to the ED 08/27/13 complaining of SOB. Patient woke up today ~4 am and "just couldn't seem to breath." He has never experienced anything like this before and believed that he must have had carbon monoxide poisoning. The only thing that made it better was walking outside into the cold. He had SOB while sitting and it was worse with exertion. Patient knew something was wrong and called EMS.  He reports being on Lasix therapy for chronic lower extremity swelling and thinks it is a little worse than usual. Patient admits to being very sedentary ever since his chemo in 2010. The most he does on most days is light house work and walking to [employer]. He is not usually short of breath with these activities. Patient has been dieting and has lost >20lbs recently; however, he said that he has maybe "overdone it with salt" the past few days.  He denies chest pain, diaphoresis, palpitations, nausea, vomiting, abdominal pain, cough, recent illnesses, fevers, chills, dizziness, presyncope or syncope. He smoked 2 ppd for over 50 years and quit 13 years ago. He drinks occasionally and takes no illicit drugs. He has an ECHO in 2009 showing an EF of 60%.   In the emergency room he was found to be in acute respiratory failure, requiring CPAP. He was also found to be hypertensive with a blood pressure of 230/130. He was started on a nitro drip and administered 40 mg of IV Lasix as symptoms were suspected to be secondary to acute CHF. Chest x-ray showed findings consistent with CHF as BNP came back elevated at 1406.   Hospital course remarkable for good diuresis  EF 35-40% by echo Cath with no CAD and normal right heart pressures  Hemodynamics  RA Mean  8  RV 30/0  PA 27/12  PCWP Mean 9  LV 186/101  AO 177/106  Since d/c doing well having sleep study in a couple of weeks  Some constipation Compliant with meds    ROS: Denies fever, malais, weight loss, blurry vision, decreased visual acuity, cough, sputum, SOB, hemoptysis, pleuritic pain, palpitaitons, heartburn, abdominal pain, melena, lower extremity edema, claudication, or rash.  All other systems reviewed and negative  General: Affect appropriate Obese [place] male  HEENT: normal Neck supple with no adenopathy JVP normal no bruits no thyromegaly Lungs clear with no wheezing and good diaphragmatic motion Heart:  S1/S2 no murmur, no rub, gallop or click PMI normal Abdomen: benighn, BS positve, no tenderness, no AAA no bruit.  No HSM or HJR Distal pulses intact with no bruits No edema Neuro non-focal Skin warm and dry No muscular weakness   Current Outpatient Prescriptions  Medication Sig Dispense Refill  . aspirin 81 MG tablet Take 81 mg by mouth daily.      . furosemide (LASIX) 20 MG tablet Take 2 tablets (40 mg total) by mouth daily.  1 tablet  0  . metFORMIN (GLUCOPHAGE) 500 MG tablet Take 1 tablet (500 mg total) by mouth 2 (two) times daily with a meal.  60 tablet  11  . metoprolol (TOPROL-XL) 200 MG 24 hr tablet take 1 tablet by mouth once daily  30 tablet  11  . Multiple Vitamin (MULTIVITAMIN WITH MINERALS) TABS tablet  Take 1 tablet by mouth daily.      Marland Kitchen olmesartan (BENICAR) 40 MG tablet Take 40 mg by mouth daily.      Marland Kitchen spironolactone (ALDACTONE) 25 MG tablet take 1 tablet by mouth once daily  30 tablet  3  . azithromycin (ZITHROMAX) 250 MG tablet As directed  6 tablet  0  . HYDROcodone-homatropine (HYDROMET) 5-1.5 MG/5ML syrup Take 5 mLs by mouth every 4 (four) hours as needed for cough.  240 mL  0  . sildenafil (VIAGRA) 100 MG tablet Take 0.5-1 tablets (50-100 mg total) by mouth daily as needed for erectile dysfunction.  10 tablet  11   No current  facility-administered medications for this visit.    Allergies  Review of patient's allergies indicates no known allergies.   Electrocardiogram:  SR rate 64 T wave inversion lead 3   Assessment and Plan

## 2013-11-06 NOTE — Assessment & Plan Note (Signed)
Explained the importance to him to f/u with this and oxygenate well at night

## 2013-11-06 NOTE — Assessment & Plan Note (Signed)
Euvolemic and improved on meds  F/u echo in 6 months  See Gay Filler in clinic for aldactone and repeat labs

## 2013-11-06 NOTE — Patient Instructions (Addendum)
Your physician wants you to follow-up in:    [place]   ECHO SAME DAY   You will receive a reminder letter in the mail two months in advance. If you don't receive a letter, please call our office to schedule the follow-up appointment. Your physician recommends that you continue on your current medications as directed. Please refer to the Current Medication list given to you today.  Your physician has requested that you have an echocardiogram. Echocardiography is a painless test that uses sound waves to create images of your heart. It provides your doctor with information about the size and shape of your heart and how well your heart's chambers and valves are working. This procedure takes approximately one hour. There are no restrictions for this procedure.   NEEDS   APPT  WITH  SALLY   IN   THE   [facility]  CLINIC ASAP

## 2013-11-06 NOTE — Assessment & Plan Note (Signed)
Well controlled.  Continue current medications and low sodium Dash type diet.    

## 2013-11-11 ENCOUNTER — Encounter: Payer: Self-pay | Admitting: Pulmonary Disease

## 2013-11-11 ENCOUNTER — Ambulatory Visit (INDEPENDENT_AMBULATORY_CARE_PROVIDER_SITE_OTHER): Payer: Medicare Other | Admitting: Pulmonary Disease

## 2013-11-11 VITALS — BP 122/72 | HR 65 | Temp 97.5°F | Ht 76.0 in | Wt 345.8 lb

## 2013-11-11 DIAGNOSIS — G4733 Obstructive sleep apnea (adult) (pediatric): Secondary | ICD-10-CM | POA: Diagnosis not present

## 2013-11-11 NOTE — Assessment & Plan Note (Signed)
The patient's history is definitely suggestive of clinically significant sleep apnea. He also has a lot of underlying comorbid medical conditions which can be greatly affected by sleep disordered breathing. It had a long discussion with him about the pathophysiology of sleep apnea, including its impact to his quality of life and cardiovascular health. I think he needs to have a sleep study scheduled for evaluation, and the patient is agreeable.

## 2013-11-11 NOTE — Patient Instructions (Signed)
Will set up for a sleep study. Work on [employer] loss Will arrange followup once results are available.

## 2013-11-11 NOTE — Progress Notes (Signed)
Subjective:    Patient ID: [name], male    DOB: 1946/07/16, 68 y.o.   MRN: [id number]  HPI The patient is a 68 year old male who I've been asked to see for possible obstructive sleep apnea. He was in the hospital in November of last year with acute congestive heart failure, and the question was raised at that time whether he may have sleep disordered breathing. He has a history of loud snoring, and his bed partner has noticed an abnormal breathing pattern during sleep. 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 He only sleeps for about 4-5 hours a night at the most, and denies frequent awakenings. He feels rested in the mornings when he first arises, but he has been noted to have inappropriate daytime sleepiness in the morning hours with sedentary activities. This is much more of a problem in the afternoon, when he has difficulty staying awake.  The patient will also fall asleep in the evenings trying to watch television or movies on occasion, but has no difficulties with sleepiness while driving. He states that his weight is down 63 pounds over the last 8 months, and his Epworth score today is abnormal at 18.   Sleep Questionnaire What time do you typically go to bed?( Between what hours) varies varies at 1440 on 11/11/13 by Virl Cagey, CMA How long does it take you to fall asleep? How many times during the night do you wake up? 0 0 at 1440 on 11/11/13 by Virl Cagey, CMA What time do you get out of bed to start your day? No Value 4-430a at 1440 on 11/11/13 by Virl Cagey, CMA Do you drive or operate heavy machinery in your occupation? No No at 1440 on 11/11/13 by Virl Cagey, CMA How much has your weight changed (up or down) over the past two years? (In pounds) 63 lb (28.577 kg) 63 lb (28.577 kg) at 1440 on 11/11/13 by Virl Cagey, CMA Have you ever had a sleep study before? No No at 1440 on  11/11/13 by Virl Cagey, CMA Do you currently use CPAP? NoNo h/o of use in ED d/t hypoxemia at 1440 on 11/11/13 by Virl Cagey, CMA Do you wear oxygen at any time? No No at 1440 on 11/11/13 by Virl Cagey, CMA   Review of Systems  Constitutional: Negative for fever and unexpected weight change.  HENT: Negative for congestion, dental problem, ear pain, nosebleeds, postnasal drip, rhinorrhea, sinus pressure, sneezing, sore throat and trouble swallowing.   Eyes: Negative for redness and itching.  Respiratory: Negative for cough, chest tightness, shortness of breath and wheezing.   Cardiovascular: Positive for leg swelling ( feet swelling). Negative for palpitations.  Gastrointestinal: Negative for nausea and vomiting.  Genitourinary: Negative for dysuria.  Musculoskeletal: Negative for joint swelling.  Skin: Negative for rash.  Neurological: Negative for headaches.  Hematological: Does not bruise/bleed easily.  Psychiatric/Behavioral: Negative for dysphoric mood. The patient is nervous/anxious.        Objective:   Physical Exam Constitutional:  Morbidly obese male, no acute distress  HENT:  Nares patent without discharge  Oropharynx without exudate, palate and uvula are mildly elongated.  Eyes:  Perrla, eomi, no scleral icterus  Neck:  No JVD, no TMG  Cardiovascular:  Normal rate, regular rhythm, no rubs or gallops.  No murmurs        Intact distal pulses but diminished  Pulmonary :  Normal breath sounds, no stridor or respiratory distress   No rales, rhonchi, or wheezing  Abdominal:  Soft, nondistended, bowel sounds present.  No tenderness noted.   Musculoskeletal:  1+ lower extremity edema noted, +venous stasis changes.   Lymph Nodes:  No cervical lymphadenopathy noted  Skin:  No cyanosis noted  Neurologic:  Alert, appropriate, moves all 4 extremities without obvious deficit.         Assessment & Plan:

## 2013-12-02 ENCOUNTER — Encounter (HOSPITAL_BASED_OUTPATIENT_CLINIC_OR_DEPARTMENT_OTHER): Payer: Medicare Other

## 2013-12-17 ENCOUNTER — Encounter: Payer: Self-pay | Admitting: Family Medicine

## 2013-12-17 ENCOUNTER — Ambulatory Visit (INDEPENDENT_AMBULATORY_CARE_PROVIDER_SITE_OTHER): Payer: Medicare Other | Admitting: Family Medicine

## 2013-12-17 VITALS — BP 118/70 | HR 66 | Temp 97.7°F | Ht 76.0 in | Wt 340.0 lb

## 2013-12-17 DIAGNOSIS — R42 Dizziness and giddiness: Secondary | ICD-10-CM

## 2013-12-17 MED ORDER — MECLIZINE HCL 25 MG PO TABS: 25.0000 mg | ORAL_TABLET | ORAL | Status: DC | PRN

## 2013-12-17 NOTE — Progress Notes (Signed)
   Subjective:    Patient ID: [name], male    DOB: 1946/06/27, 68 y.o.   MRN: [id number]  HPI Here for 3 days of dizziness and the feeling of the room spinning when he moves quickly.Marland Kitchen He feels fine sitting still. No other neurologic sx.    Review of Systems  Constitutional: Negative.   HENT: Negative.   Eyes: Negative.   Respiratory: Negative.   Cardiovascular: Negative.   Neurological: Positive for dizziness. Negative for seizures, syncope, facial asymmetry, speech difficulty, light-headedness, numbness and headaches.       Objective:   Physical Exam  Constitutional: He is oriented to person, place, and time. He appears well-developed and well-nourished.  HENT:  Right Ear: External ear normal.  Left Ear: External ear normal.  Nose: Nose normal.  Mouth/Throat: Oropharynx is clear and moist.  Cardiovascular: Normal rate, regular rhythm, normal heart sounds and intact distal pulses.   Pulmonary/Chest: Effort normal and breath sounds normal.  Neurological: He is alert and oriented to person, place, and time. No cranial nerve deficit. He exhibits normal muscle tone. Coordination normal.          Assessment & Plan:  Drink fluids. Try Claritin daily in addition to Meclizine.

## 2013-12-23 ENCOUNTER — Ambulatory Visit (HOSPITAL_BASED_OUTPATIENT_CLINIC_OR_DEPARTMENT_OTHER): Payer: Medicare Other | Attending: Pulmonary Disease

## 2013-12-23 VITALS — Ht 76.0 in | Wt 336.0 lb

## 2013-12-23 DIAGNOSIS — G4733 Obstructive sleep apnea (adult) (pediatric): Secondary | ICD-10-CM

## 2013-12-23 DIAGNOSIS — M549 Dorsalgia, unspecified: Secondary | ICD-10-CM | POA: Diagnosis not present

## 2013-12-31 ENCOUNTER — Telehealth: Payer: Self-pay | Admitting: Pulmonary Disease

## 2013-12-31 DIAGNOSIS — G4733 Obstructive sleep apnea (adult) (pediatric): Secondary | ICD-10-CM

## 2013-12-31 NOTE — Telephone Encounter (Signed)
Pt needs ov to discuss sleep study results.

## 2013-12-31 NOTE — Sleep Study (Signed)
   NAME: [name] DATE OF BIRTH:  05-Jul-1946 MEDICAL RECORD NUMBER [id number]  LOCATION: [facility]  PHYSICIAN: [facility] OF STUDY: 12/23/2013  SLEEP STUDY TYPE: Nocturnal Polysomnogram               REFERRING PHYSICIAN: Danella [name], Armando Reichert, MD  INDICATION FOR STUDY: Hypersomnia with sleep apnea  EPWORTH SLEEPINESS SCORE:  4 HEIGHT: 6\' 4"  (193 cm)  WEIGHT: 336 lb (152.409 kg)    Body mass index is 40.92 kg/(m^2).  NECK SIZE: 19 in.  MEDICATIONS: Reviewed in sleep record  SLEEP ARCHITECTURE: The patient had a total sleep time of 110 minutes, with no slow-wave sleep and decreased quantity of REM. Sleep onset latency was normal at 10 minutes, and REM onset was rapid at 36 minutes. Sleep efficiency was poor at 30%  RESPIRATORY DATA: The patient was found to have 22 apneas and 36 obstructive hypopneas, giving him an AHI of 32 events per hour. The events occurred in all body positions, and there was loud snoring noted throughout.  OXYGEN DATA: There was oxygen desaturation as low as 71% with the patient's obstructive events  CARDIAC DATA: Occasional PVC noted.  MOVEMENT/PARASOMNIA: No significant periodic limb movements or abnormal behaviors were seen.  IMPRESSION/ RECOMMENDATION:    1) moderate obstructive sleep apnea/hypopnea syndrome, with an AHI of 32 events per hour and oxygen desaturation as low as 71%. Treatment for this degree of sleep apnea should focus primarily on a trial of CPAP as well as aggressive weight loss.  2) occasional PVC noted, but no clinically significant arrhythmias were seen  3) the patient had significant back pain throughout the study, with difficulty with sleep maintenance. This is an issue for him at home as well.     [place], American Board of Sleep Medicine  ELECTRONICALLY SIGNED ON:  12/31/2013, 5:48 PM [place] PH: [phone]   FX: [phone] [place]

## 2014-01-03 NOTE — Telephone Encounter (Signed)
Pt scheduled for appt 01/06/14 at 11am with [facility] to review

## 2014-01-06 ENCOUNTER — Encounter: Payer: Self-pay | Admitting: Pulmonary Disease

## 2014-01-06 ENCOUNTER — Ambulatory Visit (INDEPENDENT_AMBULATORY_CARE_PROVIDER_SITE_OTHER): Payer: Medicare Other | Admitting: Pulmonary Disease

## 2014-01-06 VITALS — BP 140/88 | HR 73 | Temp 97.7°F | Ht 74.0 in | Wt 347.4 lb

## 2014-01-06 DIAGNOSIS — G4733 Obstructive sleep apnea (adult) (pediatric): Secondary | ICD-10-CM

## 2014-01-06 NOTE — Patient Instructions (Signed)
Will start on cpap at a moderate pressure level.  Please call if having tolerance issues.  Work on weight loss followup with me again in 8 weeks.

## 2014-01-06 NOTE — Progress Notes (Signed)
   Subjective:    Patient ID: [name], male    DOB: 1946/01/07, 68 y.o.   MRN: [id number]  HPI The patient comes in today for followup of his recent sleep study. He was found to have moderate OSA, with an AHI of 32 events per hour and significant oxygen desaturation. I have reviewed the study with him in detail, and answered all of his questions.   Review of Systems  Constitutional: Negative for fever and unexpected weight change.  HENT: Negative for congestion, dental problem, ear pain, nosebleeds, postnasal drip, rhinorrhea, sinus pressure, sneezing, sore throat and trouble swallowing.   Eyes: Negative for redness and itching.  Respiratory: Negative for cough, chest tightness, shortness of breath and wheezing.   Cardiovascular: Negative for palpitations and leg swelling.  Gastrointestinal: Negative for nausea and vomiting.  Genitourinary: Negative for dysuria.  Musculoskeletal: Negative for joint swelling.  Skin: Negative for rash.  Neurological: Negative for headaches.  Hematological: Does not bruise/bleed easily.  Psychiatric/Behavioral: Negative for dysphoric mood. The patient is not nervous/anxious.        Objective:   Physical Exam Morbidly obese male in no acute distress Nose without purulence or discharge noted Neck without lymphadenopathy or thyromegaly  Lower extremities with mild edema, no cyanosis Alert and oriented, moves all 4 extremities.       Assessment & Plan:

## 2014-01-06 NOTE — Assessment & Plan Note (Signed)
The patient has moderate obstructive sleep apnea I. his recent study with significant oxygen desaturation. Given his underlying heart disease and symptoms, I would like to treat this aggressively with CPAP while he is working on weight loss. The patient is agreeable to this approach. I will set the patient up on cpap at a moderate pressure level to allow for desensitization, and will troubleshoot the device over the next 4-6weeks if needed.  The pt is to call me if having issues with tolerance.  Will then optimize the pressure once patient is able to wear cpap on a consistent basis.

## 2014-02-01 ENCOUNTER — Other Ambulatory Visit: Payer: Self-pay | Admitting: Family Medicine

## 2014-02-03 ENCOUNTER — Other Ambulatory Visit (HOSPITAL_COMMUNITY): Payer: Self-pay | Admitting: Family Medicine

## 2014-02-12 ENCOUNTER — Telehealth: Payer: Self-pay | Admitting: Pulmonary Disease

## 2014-02-12 NOTE — Telephone Encounter (Signed)
Called and spoke with susan from [facility] and she stated that the pt came in today to be fitted with his cpap.  She stated that the pt was not able to tolerate the mask or the [place] with the setting of 10.  She stated that she did increase the EPR to 3---this is the highest they can go with a setting of 10.  Pt was not able to tolerate this as well.  She stated that she has set the cpap on auto ramp and if she needs to change this she can do it remotely.  Manuela Schwartz asking for [facility] recs.  Please advise. Thanks  No Known Allergies  Current Outpatient Prescriptions on File Prior to Visit  Medication Sig Dispense Refill  . aspirin 81 MG tablet Take 81 mg by mouth daily.      Marland Kitchen BENICAR 40 MG tablet take 1 tablet by mouth once daily  30 tablet  3  . Docusate Sodium (COLACE PO) Take 1 tablet by mouth 2 (two) times daily.      . furosemide (LASIX) 20 MG tablet Take 2 tablets (40 mg total) by mouth daily.  1 tablet  0  . furosemide (LASIX) 40 MG tablet take 1 tablet by mouth once daily  30 tablet  3  . meclizine (ANTIVERT) 25 MG tablet Take 1 tablet (25 mg total) by mouth every 4 (four) hours as needed for dizziness.  60 tablet  2  . metFORMIN (GLUCOPHAGE) 500 MG tablet Take 1 tablet (500 mg total) by mouth 2 (two) times daily with a meal.  60 tablet  11  . metoprolol (TOPROL-XL) 200 MG 24 hr tablet take 1 tablet by mouth once daily  30 tablet  3  . Multiple Vitamin (MULTIVITAMIN WITH MINERALS) TABS tablet Take 1 tablet by mouth daily.      Marland Kitchen olmesartan (BENICAR) 40 MG tablet Take 40 mg by mouth daily.      Marland Kitchen spironolactone (ALDACTONE) 25 MG tablet take 1 tablet by mouth once daily  30 tablet  3   No current facility-administered medications on file prior to visit.

## 2014-02-12 NOTE — Telephone Encounter (Signed)
Called [facility] and spoke with Iris and she will pass this message along to susan so she can change the cpap for the pt.  Nothing further is needed.

## 2014-02-12 NOTE — Telephone Encounter (Signed)
Have her set the cpap on auto 5-10cm.

## 2014-02-18 ENCOUNTER — Ambulatory Visit: Payer: Medicare Other | Admitting: *Deleted

## 2014-03-03 ENCOUNTER — Ambulatory Visit (INDEPENDENT_AMBULATORY_CARE_PROVIDER_SITE_OTHER): Payer: Medicare Other | Admitting: Pulmonary Disease

## 2014-03-03 ENCOUNTER — Encounter: Payer: Self-pay | Admitting: Pulmonary Disease

## 2014-03-03 VITALS — BP 140/80 | HR 64 | Temp 98.4°F | Ht 76.0 in | Wt 346.4 lb

## 2014-03-03 DIAGNOSIS — G4733 Obstructive sleep apnea (adult) (pediatric): Secondary | ICD-10-CM

## 2014-03-03 NOTE — Progress Notes (Signed)
   Subjective:    Patient ID: [name], male    DOB: Feb 01, 1946, 68 y.o.   MRN: [id number]  HPI The patient comes in today for followup of his obstructive sleep apnea. He is not wearing CPAP compliantly, but is not having any particular issue with mask fit or pressure. He he simply is not motivated to put the device: In the evenings because he is so tired, but is willing to keep working with the device.   Review of Systems  Constitutional: Negative for fever and unexpected weight change.  HENT: Negative for congestion, dental problem, ear pain, nosebleeds, postnasal drip, rhinorrhea, sinus pressure, sneezing, sore throat and trouble swallowing.   Eyes: Negative for redness and itching.  Respiratory: Negative for cough, chest tightness, shortness of breath and wheezing.   Cardiovascular: Negative for palpitations and leg swelling.  Gastrointestinal: Negative for nausea and vomiting.  Genitourinary: Negative for dysuria.  Musculoskeletal: Negative for joint swelling.  Skin: Negative for rash.  Neurological: Negative for headaches.  Hematological: Does not bruise/bleed easily.  Psychiatric/Behavioral: Negative for dysphoric mood. The patient is not nervous/anxious.        Objective:   Physical Exam Obese male in no acute distress Nose without purulence or discharge noted Neck without lymphadenopathy or thyromegaly No skin breakdown or pressure necrosis from the CPAP mask Lower extremities with edema noted, no cyanosis Alert and oriented, moves all 4 extremities       Assessment & Plan:

## 2014-03-03 NOTE — Assessment & Plan Note (Signed)
The pt is not wearing cpap compliantly, but is willing to give it another try.  Will need a higher pressure eventually, but will continue to work on desensitization at a lower setting for now. I have asked him to make wearing his CPAP a priority, and also to work on aggressive weight loss.

## 2014-03-03 NOTE — Patient Instructions (Signed)
Make wearing your cpap machine a priority.  If you continue to have issues wearing, let me know. Work on weight loss Please call in 4 weeks to give update with how things are going. If doing well, followup with me again in 48mos.

## 2014-03-13 ENCOUNTER — Other Ambulatory Visit: Payer: Self-pay | Admitting: Family Medicine

## 2014-05-13 ENCOUNTER — Other Ambulatory Visit (HOSPITAL_COMMUNITY): Payer: Self-pay | Admitting: Cardiovascular Disease

## 2014-05-13 ENCOUNTER — Ambulatory Visit (HOSPITAL_COMMUNITY): Payer: Medicare Other | Attending: Internal Medicine | Admitting: Radiology

## 2014-05-13 DIAGNOSIS — I509 Heart failure, unspecified: Secondary | ICD-10-CM | POA: Insufficient documentation

## 2014-05-13 DIAGNOSIS — I5022 Chronic systolic (congestive) heart failure: Secondary | ICD-10-CM | POA: Insufficient documentation

## 2014-05-13 DIAGNOSIS — I428 Other cardiomyopathies: Secondary | ICD-10-CM | POA: Diagnosis not present

## 2014-05-13 DIAGNOSIS — G4733 Obstructive sleep apnea (adult) (pediatric): Secondary | ICD-10-CM | POA: Diagnosis not present

## 2014-05-13 DIAGNOSIS — I5020 Unspecified systolic (congestive) heart failure: Secondary | ICD-10-CM

## 2014-05-13 NOTE — Progress Notes (Signed)
Echocardiogram performed.  

## 2014-05-20 ENCOUNTER — Encounter: Payer: Self-pay | Admitting: Cardiovascular Disease

## 2014-05-20 ENCOUNTER — Ambulatory Visit (INDEPENDENT_AMBULATORY_CARE_PROVIDER_SITE_OTHER): Payer: Medicare Other | Admitting: Cardiovascular Disease

## 2014-05-20 VITALS — BP 112/80 | HR 75 | Ht 76.0 in | Wt 340.8 lb

## 2014-05-20 DIAGNOSIS — I10 Essential (primary) hypertension: Secondary | ICD-10-CM

## 2014-05-20 DIAGNOSIS — I5021 Acute systolic (congestive) heart failure: Secondary | ICD-10-CM

## 2014-05-20 DIAGNOSIS — G4733 Obstructive sleep apnea (adult) (pediatric): Secondary | ICD-10-CM | POA: Diagnosis not present

## 2014-05-20 DIAGNOSIS — I509 Heart failure, unspecified: Secondary | ICD-10-CM | POA: Diagnosis not present

## 2014-05-20 NOTE — Assessment & Plan Note (Signed)
Well controlled.  Continue current medications and low sodium Dash type diet.    

## 2014-05-20 NOTE — Patient Instructions (Signed)
Your physician wants you to follow-up in:  6 MONTHS WITH DR NISHAN  You will receive a reminder letter in the mail two months in advance. If you don't receive a letter, please call our office to schedule the follow-up appointment. Your physician recommends that you continue on your current medications as directed. Please refer to the Current Medication list given to you today. 

## 2014-05-20 NOTE — Assessment & Plan Note (Signed)
Has tried CPAP but will no longer where due to nasal irritation and inconvenience  Asked him to discuss with Dr Gwenette Greet

## 2014-05-20 NOTE — Progress Notes (Signed)
Patient ID: [name], male   DOB: Jan 25, 1946, 68 y.o.   MRN: [id number] [name] is a 68 y.o. male with a history of non-hodgkin's lymphoma (diagnosed in 2008 and in remission), htn, DM, hyperlipidemia, morbid obesity and no prior cardiac history who presented to the ED 08/27/13 complaining of SOB. Patient woke up today ~4 am and "just couldn't seem to breath." He has never experienced anything like this before and believed that he must have had carbon monoxide poisoning. The only thing that made it better was walking outside into the cold. He had SOB while sitting and it was worse with exertion. Patient knew something was wrong and called EMS.  He reports being on Lasix therapy for chronic lower extremity swelling and thinks it is a little worse than usual. Patient admits to being very sedentary ever since his chemo in 2010. The most he does on most days is light house work and walking to [employer]. He is not usually short of breath with these activities. Patient has been dieting and has lost >20lbs recently; however, he said that he has maybe "overdone it with salt" the past few days.  He denies chest pain, diaphoresis, palpitations, nausea, vomiting, abdominal pain, cough, recent illnesses, fevers, chills, dizziness, presyncope or syncope. He smoked 2 ppd for over 50 years and quit 13 years ago. He drinks occasionally and takes no illicit drugs. He has an ECHO in 2009 showing an EF of 60%.   11/14 found to be in acute respiratory failure, requiring CPAP. He was also found to be hypertensive with a blood pressure of 230/130. He was started on a nitro drip and administered 40 mg of IV Lasix as symptoms were suspected to be secondary to acute CHF. Chest x-ray showed findings consistent with CHF as BNP came back elevated at 1406.   Hospital course remarkable for good diuresis EF 35-40% by echo  Cath with no CAD and normal right heart pressures      ROS: Denies fever, malais, weight  loss, blurry vision, decreased visual acuity, cough, sputum, SOB, hemoptysis, pleuritic pain, palpitaitons, heartburn, abdominal pain, melena, lower extremity edema, claudication, or rash.  All other systems reviewed and negative  General: Affect appropriate Healthy:  appears stated age 5: normal Neck supple with no adenopathy JVP normal no bruits no thyromegaly Lungs clear with no wheezing and good diaphragmatic motion Heart:  S1/S2 no murmur, no rub, gallop or click PMI normal Abdomen: benighn, BS positve, no tenderness, no AAA no bruit.  No HSM or HJR Distal pulses intact with no bruits No edema Neuro non-focal Skin warm and dry No muscular weakness   Current Outpatient Prescriptions  Medication Sig Dispense Refill  . aspirin 81 MG tablet Take 81 mg by mouth daily.      Marland Kitchen BENICAR 40 MG tablet take 1 tablet by mouth once daily  30 tablet  3  . Docusate Sodium (COLACE PO) Take 1 tablet by mouth 2 (two) times daily.      . furosemide (LASIX) 40 MG tablet take 1 tablet by mouth once daily  30 tablet  3  . meclizine (ANTIVERT) 25 MG tablet Take 1 tablet (25 mg total) by mouth every 4 (four) hours as needed for dizziness.  60 tablet  2  . metFORMIN (GLUCOPHAGE) 500 MG tablet take 1 tablet by mouth twice a day WITH A MEAL  60 tablet  3  . metoprolol (TOPROL-XL) 200 MG 24 hr tablet take 1 tablet by mouth  once daily  30 tablet  3  . Multiple Vitamin (MULTIVITAMIN WITH MINERALS) TABS tablet Take 1 tablet by mouth daily.      Marland Kitchen spironolactone (ALDACTONE) 25 MG tablet take 1 tablet by mouth once daily  30 tablet  3   No current facility-administered medications for this visit.    Allergies  Review of patient's allergies indicates no known allergies.  Electrocardiogram:  SR rate 75 PVC  otherwise normal   Assessment and Plan

## 2014-05-20 NOTE — Assessment & Plan Note (Signed)
euvolemic Discussed sliding scale lasix and daily weights Low sodium diet

## 2014-06-01 ENCOUNTER — Other Ambulatory Visit: Payer: Self-pay | Admitting: Family Medicine

## 2014-06-05 ENCOUNTER — Other Ambulatory Visit (HOSPITAL_COMMUNITY): Payer: Self-pay | Admitting: Family Medicine

## 2014-07-01 ENCOUNTER — Other Ambulatory Visit: Payer: Self-pay | Admitting: Family Medicine

## 2014-09-03 ENCOUNTER — Ambulatory Visit: Payer: Medicare Other | Admitting: Pulmonary Disease

## 2014-09-25 ENCOUNTER — Encounter (HOSPITAL_COMMUNITY): Payer: Self-pay | Admitting: Cardiovascular Disease

## 2014-09-29 ENCOUNTER — Other Ambulatory Visit: Payer: Self-pay | Admitting: Family Medicine

## 2014-10-21 ENCOUNTER — Telehealth: Payer: Self-pay | Admitting: Family Medicine

## 2014-10-21 MED ORDER — SPIRONOLACTONE 25 MG PO TABS: 25.0000 mg | ORAL_TABLET | Freq: Every day | ORAL | Status: DC

## 2014-10-21 NOTE — Telephone Encounter (Signed)
Rx sent to pharmacy and noted pt needs follow up appt.

## 2014-10-21 NOTE — Telephone Encounter (Signed)
Left a message that rx sent to pharmacy.

## 2014-10-21 NOTE — Telephone Encounter (Signed)
Needs his spironolactone 25mg  refilled as ap please. Says he has out for week and this is his 3rd request. He uses [facility] on [address]

## 2014-10-29 ENCOUNTER — Other Ambulatory Visit: Payer: Self-pay | Admitting: Family Medicine

## 2014-11-24 NOTE — Progress Notes (Signed)
Patient ID: [name], male   DOB: 04-17-1946, 70 y.o.   MRN: [id number] [name] is a 68 y.o. . 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 He smoked 2 ppd for over 50 years and quit 14 years ago. He drinks occasionally and takes no illicit drugs.    Hospital course in 2014  remarkable for good diuresis EF 35-40% by echo  Cath with no CAD and normal right heart pressures    Last echo 05/13/14 EF 55-60% trivial MR and mild LAE   Weight back up Eating too many carbs  Needs lab work and f/u with Dr Sharlene Motts for DM  ROS: Denies fever, malais, weight loss, blurry vision, decreased visual acuity, cough, sputum, SOB, hemoptysis, pleuritic pain, palpitaitons, heartburn, abdominal pain, melena, lower extremity edema, claudication, or rash.  All other systems reviewed and negative  General: Affect appropriate Obese [place] male  HEENT: normal Neck supple with no adenopathy JVP normal no bruits no thyromegaly Lungs clear with no wheezing and good diaphragmatic motion Heart:  S1/S2 no murmur, no rub, gallop or click PMI normal Abdomen: benighn, BS positve, no tenderness, no AAA no bruit.  No HSM or HJR Distal pulses intact with no bruits Mild bilateral dependant edema Neuro non-focal Skin warm and dry No muscular weakness   Current Outpatient Prescriptions  Medication Sig Dispense Refill  . aspirin 81 MG tablet Take 81 mg by mouth daily.    Marland Kitchen BENICAR 40 MG tablet take 1 tablet by mouth once daily 30 tablet 1  . Cholecalciferol (VITAMIN D3) 5000 UNITS TABS Take 1 tablet by mouth daily.    Mariane Baumgarten Sodium  (COLACE PO) Take 1 tablet by mouth 2 (two) times daily.    . furosemide (LASIX) 40 MG tablet take 1 tablet by mouth once daily 30 tablet 1  . meclizine (ANTIVERT) 25 MG tablet Take 1 tablet (25 mg total) by mouth every 4 (four) hours as needed for dizziness. 60 tablet 2  . metFORMIN (GLUCOPHAGE) 500 MG tablet take 1 tablet by mouth twice a day WITH A MEAL 60 tablet 1  . metoprolol (TOPROL-XL) 200 MG 24 hr tablet take 1 tablet by mouth once daily 30 tablet 1  . Multiple Vitamin (MULTIVITAMIN WITH MINERALS) TABS tablet Take 1 tablet by mouth daily.    . Omega-3 Fatty Acids (OMEGA 3 PO) Take 1 capsule by mouth 2 (two) times daily.    Marland Kitchen spironolactone (ALDACTONE) 25 MG tablet Take 1 tablet (25 mg total) by mouth daily. 90 tablet 0   No current facility-administered medications for this visit.    Allergies  Review of patient's allergies indicates no known allergies.  Electrocardiogram:  SR rate 75 PVC  otherwise normal  8/15  Assessment and Plan

## 2014-11-25 ENCOUNTER — Ambulatory Visit (INDEPENDENT_AMBULATORY_CARE_PROVIDER_SITE_OTHER): Payer: Medicare Other | Admitting: Cardiovascular Disease

## 2014-11-25 ENCOUNTER — Encounter: Payer: Self-pay | Admitting: Cardiovascular Disease

## 2014-11-25 VITALS — BP 130/76 | HR 70 | Ht 76.0 in | Wt 377.4 lb

## 2014-11-25 DIAGNOSIS — E119 Type 2 diabetes mellitus without complications: Secondary | ICD-10-CM

## 2014-11-25 DIAGNOSIS — E785 Hyperlipidemia, unspecified: Secondary | ICD-10-CM

## 2014-11-25 DIAGNOSIS — G4733 Obstructive sleep apnea (adult) (pediatric): Secondary | ICD-10-CM | POA: Diagnosis not present

## 2014-11-25 DIAGNOSIS — Z79899 Other long term (current) drug therapy: Secondary | ICD-10-CM

## 2014-11-25 DIAGNOSIS — R7309 Other abnormal glucose: Secondary | ICD-10-CM | POA: Diagnosis not present

## 2014-11-25 LAB — CBC WITH DIFFERENTIAL/PLATELET
BASOS PCT: 0.2 % (ref 0.0–3.0)
Basophils Absolute: 0 10*3/uL (ref 0.0–0.1)
Eosinophils Absolute: 0.2 10*3/uL (ref 0.0–0.7)
Eosinophils Relative: 2.1 % (ref 0.0–5.0)
HCT: 45.9 % (ref 39.0–52.0)
HEMOGLOBIN: 15.7 g/dL (ref 13.0–17.0)
LYMPHS PCT: 20.3 % (ref 12.0–46.0)
Lymphs Abs: 1.5 10*3/uL (ref 0.7–4.0)
MCHC: 34.1 g/dL (ref 30.0–36.0)
MCV: 84.8 fl (ref 78.0–100.0)
Monocytes Absolute: 0.5 10*3/uL (ref 0.1–1.0)
Monocytes Relative: 6.1 % (ref 3.0–12.0)
NEUTROS ABS: 5.4 10*3/uL (ref 1.4–7.7)
Neutrophils Relative %: 71.3 % (ref 43.0–77.0)
Platelets: 234 10*3/uL (ref 150.0–400.0)
RBC: 5.42 Mil/uL (ref 4.22–5.81)
RDW: 14.3 % (ref 11.5–15.5)
WBC: 7.6 10*3/uL (ref 4.0–10.5)

## 2014-11-25 LAB — BASIC METABOLIC PANEL
BUN: 20 mg/dL (ref 6–23)
CO2: 29 mEq/L (ref 19–32)
Calcium: 9.4 mg/dL (ref 8.4–10.5)
Chloride: 101 mEq/L (ref 96–112)
Creatinine, Ser: 1.07 mg/dL (ref 0.40–1.50)
GFR: 72.93 mL/min (ref >60.00)
GLUCOSE: 115 mg/dL — AB (ref 70–99)
POTASSIUM: 3.9 meq/L (ref 3.5–5.1)
SODIUM: 138 meq/L (ref 135–145)

## 2014-11-25 LAB — HEMOGLOBIN A1C: HEMOGLOBIN A1C: 6.2 % (ref 4.6–6.5)

## 2014-11-25 NOTE — Assessment & Plan Note (Signed)
Will check labs today May need more than glucophage with weight gain.  Discussed low carb diet  F/U  Dr [name]

## 2014-11-25 NOTE — Assessment & Plan Note (Signed)
Not compliant with CPAP  F/u pulmonary Discussed weight loss

## 2014-11-25 NOTE — Assessment & Plan Note (Signed)
Cholesterol is at goal.  Continue current dose of statin and diet Rx.  No myalgias or side effects.  F/U  LFT's in 6 months. Lab Results  Component Value Date   LDLCALC 113* 08/28/2013

## 2014-11-25 NOTE — Assessment & Plan Note (Signed)
Improved on medical Rx EF now low normal Continue ARB and beta blocker with aldactone

## 2014-11-25 NOTE — Assessment & Plan Note (Signed)
Well controlled.  Continue current medications and low sodium Dash type diet.    

## 2014-11-25 NOTE — Patient Instructions (Signed)
Your physician wants you to follow-up in:  [place] will receive a reminder letter in the mail two months in advance. If you don't receive a letter, please call our office to schedule the follow-up appointment. Your physician recommends that you continue on your current medications as directed. Please refer to the Current Medication list given to you today.   Your physician recommends that you return for lab work in:  TODAY   CBC BMET  A1C

## 2014-11-28 ENCOUNTER — Other Ambulatory Visit: Payer: Self-pay | Admitting: Family Medicine

## 2014-12-28 ENCOUNTER — Other Ambulatory Visit: Payer: Self-pay | Admitting: Family Medicine

## 2015-01-27 ENCOUNTER — Other Ambulatory Visit: Payer: Self-pay | Admitting: Family Medicine

## 2015-01-27 NOTE — Telephone Encounter (Signed)
I spoke with pt and he is going to call and schedule a office visit with labs.

## 2015-02-26 ENCOUNTER — Other Ambulatory Visit: Payer: Self-pay | Admitting: Family Medicine

## 2015-03-28 ENCOUNTER — Other Ambulatory Visit: Payer: Self-pay | Admitting: Family Medicine

## 2015-05-22 ENCOUNTER — Telehealth: Payer: Self-pay | Admitting: Family Medicine

## 2015-05-22 NOTE — Telephone Encounter (Addendum)
Pt would like you to know he is trying to arrange a time he can come in for a fup on his meds.   Pt's mom cannot be left alone, has several health issues and he is trying ti figure out when he can come in. .pt has been scheduled

## 2015-05-25 NOTE — Telephone Encounter (Signed)
I spoke with pt and he does not need any refills now.

## 2015-06-02 ENCOUNTER — Ambulatory Visit (INDEPENDENT_AMBULATORY_CARE_PROVIDER_SITE_OTHER): Payer: Medicare Other | Admitting: Family Medicine

## 2015-06-02 ENCOUNTER — Encounter: Payer: Self-pay | Admitting: Family Medicine

## 2015-06-02 VITALS — BP 136/78 | HR 69 | Temp 98.3°F | Ht 76.0 in | Wt 374.0 lb

## 2015-06-02 DIAGNOSIS — I10 Essential (primary) hypertension: Secondary | ICD-10-CM | POA: Diagnosis not present

## 2015-06-02 DIAGNOSIS — E119 Type 2 diabetes mellitus without complications: Secondary | ICD-10-CM

## 2015-06-02 DIAGNOSIS — I5022 Chronic systolic (congestive) heart failure: Secondary | ICD-10-CM | POA: Diagnosis not present

## 2015-06-02 NOTE — Progress Notes (Signed)
   Subjective:    Patient ID: [name], male    DOB: Feb 16, 1946, 69 y.o.   MRN: [id number]  HPI Here to follow up. He feels well except for some SOB in exertion. Of course he has put on 35 lbs of weight in the past year. He has been seeing Dr. Gwenette Greet and Dr. Johnsie Cancel regularly. His last A1c in February was excellent at 6.2.    Review of Systems  Constitutional: Negative.   Respiratory: Positive for shortness of breath. Negative for apnea, cough, choking, chest tightness, wheezing and stridor.   Cardiovascular: Negative.   Endocrine: Negative.   Neurological: Negative.        Objective:   Physical Exam  Constitutional: He is oriented to person, place, and time.  Obese   Neck: No thyromegaly present.  Cardiovascular: Normal rate, regular rhythm, normal heart sounds and intact distal pulses.   Pulmonary/Chest: Effort normal and breath sounds normal.  Musculoskeletal: He exhibits no edema.  Lymphadenopathy:    He has no cervical adenopathy.  Neurological: He is alert and oriented to person, place, and time.          Assessment & Plan:  His diabetes seems to be stable but we will check another A1c today. He needs to start walking on his treadmill again to lose some weight. Check a BMET for renal function. His cardiac status is stable.

## 2015-06-02 NOTE — Progress Notes (Signed)
Pre visit review using our clinic review tool, if applicable. No additional management support is needed unless otherwise documented below in the visit note. 

## 2015-06-03 LAB — BASIC METABOLIC PANEL
BUN: 22 mg/dL (ref 6–23)
CALCIUM: 10 mg/dL (ref 8.4–10.5)
CO2: 26 meq/L (ref 19–32)
Chloride: 102 mEq/L (ref 96–112)
Creatinine, Ser: 1.15 mg/dL (ref 0.40–1.50)
GFR: 67 mL/min (ref >60.00)
GLUCOSE: 109 mg/dL — AB (ref 70–99)
POTASSIUM: 4.2 meq/L (ref 3.5–5.1)
SODIUM: 139 meq/L (ref 135–145)

## 2015-06-03 LAB — HEMOGLOBIN A1C: Hgb A1c MFr Bld: 6.1 % (ref 4.6–6.5)

## 2015-06-26 ENCOUNTER — Other Ambulatory Visit: Payer: Self-pay | Admitting: Family Medicine

## 2015-09-27 ENCOUNTER — Emergency Department (HOSPITAL_COMMUNITY)
Admission: EM | Admit: 2015-09-27 | Discharge: 2015-09-27 | Disposition: A | Discharge status: Home / Self Care | Payer: Medicare Other | Attending: Emergency Medicine | Admitting: Emergency Medicine

## 2015-09-27 ENCOUNTER — Emergency Department (HOSPITAL_COMMUNITY): Payer: Medicare Other

## 2015-09-27 ENCOUNTER — Encounter (HOSPITAL_COMMUNITY): Payer: Self-pay

## 2015-09-27 DIAGNOSIS — I5022 Chronic systolic (congestive) heart failure: Secondary | ICD-10-CM | POA: Diagnosis not present

## 2015-09-27 DIAGNOSIS — I10 Essential (primary) hypertension: Secondary | ICD-10-CM | POA: Insufficient documentation

## 2015-09-27 DIAGNOSIS — Z7984 Long term (current) use of oral hypoglycemic drugs: Secondary | ICD-10-CM | POA: Insufficient documentation

## 2015-09-27 DIAGNOSIS — Z79899 Other long term (current) drug therapy: Secondary | ICD-10-CM | POA: Diagnosis not present

## 2015-09-27 DIAGNOSIS — Z8719 Personal history of other diseases of the digestive system: Secondary | ICD-10-CM | POA: Diagnosis not present

## 2015-09-27 DIAGNOSIS — R072 Precordial pain: Secondary | ICD-10-CM | POA: Diagnosis not present

## 2015-09-27 DIAGNOSIS — Z87891 Personal history of nicotine dependence: Secondary | ICD-10-CM | POA: Insufficient documentation

## 2015-09-27 DIAGNOSIS — R079 Chest pain, unspecified: Secondary | ICD-10-CM

## 2015-09-27 DIAGNOSIS — Z7982 Long term (current) use of aspirin: Secondary | ICD-10-CM | POA: Insufficient documentation

## 2015-09-27 DIAGNOSIS — J811 Chronic pulmonary edema: Secondary | ICD-10-CM | POA: Diagnosis not present

## 2015-09-27 DIAGNOSIS — E119 Type 2 diabetes mellitus without complications: Secondary | ICD-10-CM | POA: Diagnosis not present

## 2015-09-27 DIAGNOSIS — Z8669 Personal history of other diseases of the nervous system and sense organs: Secondary | ICD-10-CM | POA: Diagnosis not present

## 2015-09-27 DIAGNOSIS — Z8572 Personal history of non-Hodgkin lymphomas: Secondary | ICD-10-CM | POA: Diagnosis not present

## 2015-09-27 LAB — CBC
HEMATOCRIT: 44.7 % (ref 39.0–52.0)
Hemoglobin: 14.8 g/dL (ref 13.0–17.0)
MCH: 28.8 pg (ref 26.0–34.0)
MCHC: 33.1 g/dL (ref 30.0–36.0)
MCV: 87.1 fL (ref 78.0–100.0)
Platelets: 185 10*3/uL (ref 150–400)
RBC: 5.13 MIL/uL (ref 4.22–5.81)
RDW: 13.8 % (ref 11.5–15.5)
WBC: 6.1 10*3/uL (ref 4.0–10.5)

## 2015-09-27 LAB — BASIC METABOLIC PANEL
Anion gap: 9 (ref 5–15)
BUN: 17 mg/dL (ref 6–20)
CHLORIDE: 104 mmol/L (ref 101–111)
CO2: 26 mmol/L (ref 22–32)
Calcium: 9.3 mg/dL (ref 8.9–10.3)
Creatinine, Ser: 1.02 mg/dL (ref 0.61–1.24)
GFR calc Af Amer: >60 mL/min (ref >60)
GFR calc non Af Amer: >60 mL/min (ref >60)
GLUCOSE: 136 mg/dL — AB (ref 65–99)
POTASSIUM: 3.8 mmol/L (ref 3.5–5.1)
Sodium: 139 mmol/L (ref 135–145)

## 2015-09-27 LAB — I-STAT TROPONIN, ED: Troponin i, poc: 0 ng/mL (ref 0.00–0.08)

## 2015-09-27 NOTE — ED Notes (Signed)
Pt here for one sharp episode of chest pain tonight at 2000 mid chest. Called EMS and they ran a 12 lead. Pressure was 168/78. Pt denies any other symptoms. After the pain hit it went away just as fast. Denies any pain at present.

## 2015-09-27 NOTE — ED Provider Notes (Signed)
CSN: [id number]     Arrival date & time 09/27/15  2116 History   First MD Initiated Contact with Patient 09/27/15 2136     Chief Complaint  Patient presents with  . Chest Pain     (Consider location/radiation/quality/duration/timing/severity/associated sxs/prior Treatment) HPI  Patient is a 69 year old male with past medical history significant for hypertension, diabetes, obesity, hyperlipidemia, HFrEF (EF 35-40%), who presents to the emergency department with chest pain. Reports that he was sitting at home watching TV when he had a sudden, sharp episode of substernal chest pain. Reports that the pain lasted for approximately 1 second and then went away. No association with shortness of breath, nausea, vomiting, diaphoresis, lightheadedness, syncope. Patient got up and walked around, denied any chest pain or shortness of breath. Continues to be chest pain-free upon arrival to the emergency department. Denies recent fever, chills, cough, congestion, lower extremity swelling.  Past Medical History  Diagnosis Date  . Hypertension   . Diverticulitis   . Headache(784.0)   . Lymphoma, non Hodgkin's     sees Dr. [name]   . Diabetes mellitus without complication ([place])   . Morbid obesity ([place])   . Hyperlipidemia   . Chronic systolic CHF (congestive heart failure) ([place])     a. Echo (08/28/13): Mild LVH, EF 35-40%, diffuse HK, moderate to severe LAE.  Marland Kitchen NICM (nonischemic cardiomyopathy) ([place])     a. R/L Heart cath 08/30/13: RA mean 8, RV 30/0, PA 27/12, mean PCWP 9, CO 5.67, CI 1.98; normal coronary arteries  . Sleep apnea    Past Surgical History  Procedure Laterality Date  . Hernia repair      umblical  . Incarcerated hernia      vental 11/19/08 Dr. [name]  . Left and right heart catheterization with coronary angiogram N/A 08/30/2013    Procedure: LEFT AND RIGHT HEART CATHETERIZATION WITH CORONARY ANGIOGRAM;  Surgeon: [name], MD;  Location[facility] C[facility] LAB;  Service:  Cardiovascular;  Laterality: N/A;   Family History  Problem Relation Age of Onset  . Heart failure Mother   . Cancer Father     colon  . Hypertension Mother   . Hypertension Father   . Stroke Maternal Grandmother   . Diabetes Father   . Diabetes Paternal Grandfather   . Cancer Maternal Grandfather    Social History  Substance Use Topics  . Smoking status: Former Smoker -- 2.00 packs/day for 52 years    Types: Cigarettes    Start date: 08/27/1965    Quit date: 08/28/1999  . Smokeless tobacco: Never Used     Comment: quit 13 years ago  . Alcohol Use: 0.0 oz/week    0 Standard drinks or equivalent per week     Comment: occ    Review of Systems  Constitutional: Negative for fever and appetite change.  HENT: Negative for congestion.   Eyes: Negative for visual disturbance.  Respiratory: Negative for shortness of breath.   Cardiovascular: Positive for chest pain. Negative for palpitations.  Gastrointestinal: Negative for nausea, vomiting, abdominal pain and blood in stool.  Genitourinary: Negative for dysuria and decreased urine volume.  Musculoskeletal: Negative for back pain.  Skin: Negative for rash.  Neurological: Negative for dizziness, seizures, weakness, light-headedness and headaches.  Psychiatric/Behavioral: Negative for behavioral problems.      Allergies  Review of patient's allergies indicates no known allergies.  Home Medications   Prior to Admission medications   Medication Sig Start Date End Date Taking? Authorizing Provider  aspirin 81 MG tablet Take 81 mg by mouth daily.   Yes Historical Provider, MD  BENICAR 40 MG tablet take 1 tablet by mouth once daily 06/29/15  Yes [name], MD  furosemide (LASIX) 40 MG tablet take 1 tablet by mouth once daily 06/29/15  Yes [name], MD  meclizine (ANTIVERT) 25 MG tablet Take 1 tablet (25 mg total) by mouth every 4 (four) hours as needed for dizziness. 12/17/13  Yes [name], MD  metFORMIN (GLUCOPHAGE)  500 MG tablet take 1 tablet by mouth twice a day with meals 06/29/15  Yes [name], MD  metoprolol (TOPROL-XL) 200 MG 24 hr tablet take 1 tablet by mouth once daily 06/29/15  Yes [name], MD  Omega-3 Fatty Acids (OMEGA 3 PO) Take 2-3 capsules by mouth daily.    Yes Historical Provider, MD  polyethylene glycol (MIRALAX / GLYCOLAX) packet Take 17 g by mouth daily as needed for moderate constipation.   Yes Historical Provider, MD  spironolactone (ALDACTONE) 25 MG tablet take 1 tablet by mouth once daily 06/29/15  Yes [name], MD   BP 115/68 mmHg  Pulse 60  Temp(Src) 98.6 F (37 C) (Oral)  Resp 15  Ht 6\' 4"  (1.93 m)  SpO2 96% Physical Exam  Constitutional: He is oriented to person, place, and time. He appears well-developed and well-nourished. No distress.  Morbidly obese  HENT:  Head: Atraumatic.  Mouth/Throat: Oropharynx is clear and moist.  Eyes: Conjunctivae and EOM are normal. Pupils are equal, round, and reactive to light.  Neck: Normal range of motion. No JVD present. No tracheal deviation present.  Cardiovascular: Normal rate, regular rhythm, normal heart sounds and intact distal pulses.   Pulmonary/Chest: Effort normal and breath sounds normal. No respiratory distress. He has no wheezes. He has no rales. He exhibits no tenderness.  Abdominal: Soft. He exhibits no distension. There is no tenderness. There is no rebound and no guarding.  Musculoskeletal: Normal range of motion.  Neurological: He is alert and oriented to person, place, and time.  Skin: Skin is warm.  Psychiatric: He has a normal mood and affect.  Nursing note and vitals reviewed.   ED Course  Procedures (including critical care time) Labs Review Labs Reviewed  BASIC METABOLIC PANEL - Abnormal; Notable for the following:    Glucose, Bld 136 (*)    All other components within normal limits  CBC  I-STAT TROPOININ, ED    Imaging Review Dg Chest 2 View  09/27/2015  CLINICAL DATA:  Acute onset  of centralized chest pain and tenderness. Initial encounter. EXAM: CHEST  2 VIEW COMPARISON:  Chest radiograph performed 08/27/2013 FINDINGS: The lungs are well-aerated. Vascular congestion is noted. Mild bibasilar opacities may reflect atelectasis or mild interstitial edema. There is no evidence of pleural effusion or pneumothorax. The heart is borderline normal in size. No acute osseous abnormalities are seen. IMPRESSION: Vascular congestion noted. Mild bibasilar airspace opacities may reflect atelectasis or mild interstitial edema. Electronically Signed   By: Garald Balding M.D.   On: 09/27/2015 22:17   I have personally reviewed and evaluated these images and lab results as part of my medical decision-making.   EKG Interpretation   Date/Time:  Sunday September 27 2015 21:18:14 EST Ventricular Rate:  68 PR Interval:  206 QRS Duration: 100 QT Interval:  414 QTC Calculation: 440 R Axis:   11 Text Interpretation:  Sinus rhythm with frequent Premature ventricular  complexes Otherwise normal ECG ED  PHYSICIAN INTERPRETATION AVAILABLE IN  [facility] Confirmed by TEST, Record ([id number]) on 09/28/2015 6:51:58 AM      MDM   Final diagnoses:  Chest pain, unspecified chest pain type    Patient is a 69 year old male who presents to the emergency department with an episode of one second of sharp substernal chest pain with no other associated symptoms. On arrival no acute distress, not ill appearing. Afebrile, hemodynamically stable. Exam as above, notable for lungs clear to auscultation bilaterally, regular rate and rhythm, intact distal pulses, benign abdominal exam.  EKG showed normal sinus rhythm with frequent PVCs, normal intervals, no chamber enlargement, no signs of ischemia, no significant change from prior. Chest x-ray showed no acute findings. Initial troponin 0. Lab workup unremarkable. Patient with atypical chest pain. HEART score high based on risk factors and age. However, given the  one second of atypical chest pain, doubt ACS. Do not feel that the patient needs to stay for serial troponins.  Patient stable for discharge home. Discussed follow-up with primary care physician in the next 1-2 days for reevaluation. Discussed strict return precautions to the emergency department. Patient expressed understanding, no questions or concerns at time of discharge.    Nathaniel Man, MD 09/29/15 [id number]  Davonna Belling, MD 09/30/15 [phone]

## 2015-09-27 NOTE — ED Notes (Signed)
Patient is alert and orientedx4.  Patient was explained discharge instructions and they understood them with no questions.   

## 2015-10-15 ENCOUNTER — Encounter: Payer: Self-pay | Admitting: *Deleted

## 2015-11-25 NOTE — Progress Notes (Signed)
Patient ID: [name], male   DOB: 05/22/1946, 70 y.o.   MRN: [id number]   [name] is a 70 y.o. . 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 He smoked 2 ppd for over 50 years and quit 14 years ago. He drinks occasionally and takes no illicit drugs.    Hospital course in 2014  remarkable for good diuresis EF 35-40% by echo  Cath with no CAD and normal right heart pressures    Last echo 05/13/14 EF 55-60% trivial MR and mild LAE   Weight back up Eating too many carbs  Needs lab work and f/u with Dr Sharlene Motts for DM  ROS: Denies fever, malais, weight loss, blurry vision, decreased visual acuity, cough, sputum, SOB, hemoptysis, pleuritic pain, palpitaitons, heartburn, abdominal pain, melena, lower extremity edema, claudication, or rash.  All other systems reviewed and negative  General: Affect appropriate Obese [name] male  HEENT: normal Neck supple with no adenopathy JVP normal no bruits no thyromegaly Lungs clear with no wheezing and good diaphragmatic motion Heart:  S1/S2 no murmur, no rub, gallop or click PMI normal Abdomen: benighn, BS positve, no tenderness, no AAA no bruit.  No HSM or HJR Distal pulses intact with no bruits Mild bilateral dependant edema Neuro non-focal Skin warm and dry No muscular weakness   Current Outpatient Prescriptions  Medication Sig Dispense Refill  . aspirin 81 MG tablet Take 81 mg by mouth daily.    Marland Kitchen BENICAR 40 MG tablet take 1 tablet by mouth once daily 30 tablet 6  . furosemide (LASIX) 40 MG tablet take 1 tablet by mouth once daily 30 tablet 6  . meclizine  (ANTIVERT) 25 MG tablet Take 1 tablet (25 mg total) by mouth every 4 (four) hours as needed for dizziness. 60 tablet 2  . metFORMIN (GLUCOPHAGE) 500 MG tablet take 1 tablet by mouth twice a day with meals 60 tablet 6  . metoprolol (TOPROL-XL) 200 MG 24 hr tablet take 1 tablet by mouth once daily 30 tablet 6  . Omega-3 Fatty Acids (OMEGA 3 PO) Take 2-3 capsules by mouth daily.     . polyethylene glycol (MIRALAX / GLYCOLAX) packet Take 17 g by mouth daily as needed for moderate constipation.    Marland Kitchen spironolactone (ALDACTONE) 25 MG tablet take 1 tablet by mouth once daily 30 tablet 6   No current facility-administered medications for this visit.    Allergies  Review of patient's allergies indicates no known allergies.  Electrocardiogram:  SR rate 75 PVC  otherwise normal  8/15  Assessment and Plan DCM: HTN: DM:   [name]

## 2015-11-26 ENCOUNTER — Encounter: Payer: Medicare Other | Admitting: Cardiovascular Disease

## 2016-01-25 ENCOUNTER — Encounter: Payer: Self-pay | Admitting: Family Medicine

## 2016-01-25 ENCOUNTER — Ambulatory Visit (INDEPENDENT_AMBULATORY_CARE_PROVIDER_SITE_OTHER): Payer: Medicare Other | Admitting: Family Medicine

## 2016-01-25 ENCOUNTER — Telehealth: Payer: Self-pay | Admitting: Family Medicine

## 2016-01-25 VITALS — BP 142/78 | HR 79 | Temp 97.9°F | Ht 76.0 in | Wt 386.4 lb

## 2016-01-25 DIAGNOSIS — J988 Other specified respiratory disorders: Secondary | ICD-10-CM

## 2016-01-25 MED ORDER — OLMESARTAN MEDOXOMIL 40 MG PO TABS
40.0000 mg | ORAL_TABLET | Freq: Every day | ORAL | Status: DC
Start: 2016-01-25 — End: 2016-02-24

## 2016-01-25 MED ORDER — METOPROLOL SUCCINATE ER 200 MG PO TB24: 200.0000 mg | ORAL_TABLET | Freq: Every day | ORAL | Status: DC

## 2016-01-25 MED ORDER — METFORMIN HCL 500 MG PO TABS: 500.0000 mg | ORAL_TABLET | Freq: Two times a day (BID) | ORAL | Status: DC

## 2016-01-25 MED ORDER — FUROSEMIDE 40 MG PO TABS
40.0000 mg | ORAL_TABLET | Freq: Every day | ORAL | Status: DC
Start: 2016-01-25 — End: 2016-02-24

## 2016-01-25 MED ORDER — SPIRONOLACTONE 25 MG PO TABS: 25.0000 mg | ORAL_TABLET | Freq: Every day | ORAL | Status: DC

## 2016-01-25 NOTE — Progress Notes (Signed)
Pre visit review using our clinic review tool, if applicable. No additional management support is needed unless otherwise documented below in the visit note. 

## 2016-01-25 NOTE — Progress Notes (Signed)
HPI:  [name] is a pleasant 70 year old here for an acute visit for upper respiratory symptoms: -started: 2-3 days ago -symptoms: Lots of clear nasal congestion, fever around 100-100.5 on first day, body aches, diarrhea  - now resolving , sore throat, cough -denies:SOB, nausea, vomiting  tooth pain -has tried: Coricidin  -sick contacts/travel/risks: no reported flu, strep or tick exposure - His mother had similar symptoms and is recovering  -Hx of: see below  ROS: See pertinent positives and negatives per HPI.  Past Medical History  Diagnosis Date  . Hypertension   . Diverticulitis   . Headache(784.0)   . Lymphoma, non Hodgkin's     sees Dr. [name]   . Diabetes mellitus without complication ([place])   . Morbid obesity ([place])   . Hyperlipidemia   . Chronic systolic CHF (congestive heart failure) ([place])     a. Echo (08/28/13): Mild LVH, EF 35-40%, diffuse HK, moderate to severe LAE.  Marland Kitchen NICM (nonischemic cardiomyopathy) ([place])     a. R/L Heart cath 08/30/13: RA mean 8, RV 30/0, PA 27/12, mean PCWP 9, CO 5.67, CI 1.98; normal coronary arteries  . Sleep apnea     Past Surgical History  Procedure Laterality Date  . Hernia repair      umblical  . Incarcerated hernia      vental 11/19/08 Dr. [name]  . Left and right heart catheterization with coronary angiogram N/A 08/30/2013    Procedure: LEFT AND RIGHT HEART CATHETERIZATION WITH CORONARY ANGIOGRAM;  Surgeon: [name], MD;  Location: [facility] CATH LAB;  Service: Cardiovascular;  Laterality: N/A;    Family History  Problem Relation Age of Onset  . Heart failure Mother   . Cancer Father     colon  . Hypertension Mother   . Hypertension Father   . Stroke Maternal Grandmother   . Diabetes Father   . Diabetes Paternal Grandfather   . Cancer Maternal Grandfather     Social History   Social History  . Marital Status: Married    Spouse Name: N/A  . Number of Children: N/A  . Years of Education: N/A    Occupational History  . retired--postal worker    Social History Main Topics  . Smoking status: Former Smoker -- 2.00 packs/day for 52 years    Types: Cigarettes    Start date: 08/27/1965    Quit date: 08/28/1999  . Smokeless tobacco: Never Used     Comment: quit 13 years ago  . Alcohol Use: 0.0 oz/week    0 Standard drinks or equivalent per week     Comment: occ  . Drug Use: No  . Sexual Activity: Not Asked   Other Topics Concern  . None   Social History Narrative     Current outpatient prescriptions:  .  aspirin 81 MG tablet, Take 81 mg by mouth daily., Disp: , Rfl:  .  meclizine (ANTIVERT) 25 MG tablet, Take 1 tablet (25 mg total) by mouth every 4 (four) hours as needed for dizziness., Disp: 60 tablet, Rfl: 2 .  Omega-3 Fatty Acids (OMEGA 3 PO), Take 2-3 capsules by mouth daily. , Disp: , Rfl:  .  polyethylene glycol (MIRALAX / GLYCOLAX) packet, Take 17 g by mouth daily as needed for moderate constipation., Disp: , Rfl:  .  furosemide (LASIX) 40 MG tablet, Take 1 tablet (40 mg total) by mouth daily., Disp: 30 tablet, Rfl: 0 .  metFORMIN (GLUCOPHAGE) 500 MG tablet, Take 1 tablet (500 mg  total) by mouth 2 (two) times daily with a meal., Disp: 60 tablet, Rfl: 0 .  metoprolol (TOPROL-XL) 200 MG 24 hr tablet, Take 1 tablet (200 mg total) by mouth daily., Disp: 30 tablet, Rfl: 0 .  olmesartan (BENICAR) 40 MG tablet, Take 1 tablet (40 mg total) by mouth daily., Disp: 30 tablet, Rfl: 0 .  spironolactone (ALDACTONE) 25 MG tablet, Take 1 tablet (25 mg total) by mouth daily., Disp: 30 tablet, Rfl: 0  EXAM:  Filed Vitals:   01/25/16 1631  BP: 142/78  Pulse: 79  Temp: 97.9 F (36.6 C)    Body mass index is 47.05 kg/(m^2).  GENERAL: vitals reviewed and listed above, alert, oriented, appears well hydrated and in no acute distress  HEENT: atraumatic, conjunttiva clear, no obvious abnormalities on inspection of external nose and ears, normal appearance of ear canals and TMs,  clear nasal congestion, mild post oropharyngeal erythema with PND, no tonsillar edema or exudate, no sinus TTP  NECK: no obvious masses on inspection  LUNGS: clear to auscultation bilaterally, no wheezes, rales or rhonchi, good air movement  CV: HRRR, no peripheral edema  MS: moves all extremities without noticeable abnormality  PSYCH: pleasant and cooperative, no obvious depression or anxiety  ASSESSMENT AND PLAN:  Discussed the following assessment and plan:  Respiratory infection  We discussed potential/likely etiologies, with mild influenza being vs. Viral upper respiratory being most likely. We discussed risks/benefits/indications/best timing for tamiflu, likely course, transmission, potential complications, signs of developing a serious illness and return and emergency precuations. I advised Tamiflu given his other comorbidities, however after discussion he opted to not do this since he is feeling much better today. He opted for over-the-counter options for cough. -of course, we advised to return or notify a doctor immediately if symptoms worsen or persist or new concerns arise.    Patient Instructions  Before you leave: Schedule regular follow up with your primary doctor in about a month if you do not already have a follow-up visit scheduled Influenza, Adult Influenza ("the flu") is a viral infection of the respiratory tract. It occurs more often in winter months because people spend more time in close contact with one another. Influenza can make you feel very sick. Influenza easily spreads from person to person (contagious). CAUSES  Influenza is caused by a virus that infects the respiratory tract. You can catch the virus by breathing in droplets from an infected person's cough or sneeze. You can also catch the virus by touching something that was recently contaminated with the virus and then touching your mouth, nose, or eyes. RISKS AND COMPLICATIONS You may be at risk for a  more severe case of influenza if you smoke cigarettes, have diabetes, have chronic heart disease (such as heart failure) or lung disease (such as asthma), or if you have a weakened immune system. Elderly people and pregnant women are also at risk for more serious infections. The most common problem of influenza is a lung infection (pneumonia). Sometimes, this problem can require emergency medical care and may be life threatening. SIGNS AND SYMPTOMS  Symptoms typically last 4 to 10 days and may include:  Fever.  Chills.  Headache, body aches, and muscle aches.  Sore throat.  Chest discomfort and cough.  Poor appetite.  Weakness or feeling tired.  Dizziness.  Nausea or vomiting. DIAGNOSIS  Diagnosis of influenza is often made based on your history and a physical exam. A nose or throat swab test can be done to confirm the  diagnosis. TREATMENT  In mild cases, influenza goes away on its own. Treatment is directed at relieving symptoms. For more severe cases, your health care provider may prescribe antiviral medicines to shorten the sickness. Antibiotic medicines are not effective because the infection is caused by a virus, not by bacteria. HOME CARE INSTRUCTIONS  Take medicines only as directed by your health care provider.  Use a cool mist humidifier to make breathing easier.  Get plenty of rest until your temperature returns to normal. This usually takes 3 to 4 days.  Drink enough fluid to keep your urine clear or pale yellow.  Cover yourmouth and nosewhen coughing or sneezing,and wash your handswellto prevent thevirusfrom spreading.  Stay homefromwork orschool untilthe fever is gonefor at least 60full day. PREVENTION  An annual influenza vaccination (flu shot) is the best way to avoid getting influenza. An annual flu shot is now routinely recommended for all adults in the [place] IF:  You experiencechest pain, yourcough worsens,or you producemore  mucus.  Youhave nausea,vomiting, ordiarrhea.  Your fever returns or gets worse. SEEK IMMEDIATE MEDICAL CARE IF:  You havetrouble breathing, you become short of breath,or your skin ornails becomebluish.  You have severe painor stiffnessin the neck.  You develop a sudden headache, or pain in the face or ear.  You have nausea or vomiting that you cannot control. MAKE SURE YOU:   Understand these instructions.  Will watch your condition.  Will get help right away if you are not doing well or get worse.   This information is not intended to replace advice given to you by your health care provider. Make sure you discuss any questions you have with your health care provider.   Document Released: 09/30/2000 Document Revised: 10/24/2014 Document Reviewed: 01/02/2012 Elsevier Interactive Patient Education 2016 [place], [place]

## 2016-01-25 NOTE — Patient Instructions (Addendum)
Before you leave: Schedule regular follow up with your primary doctor in about a month if you do not already have a follow-up visit scheduled Influenza, Adult Influenza ("the flu") is a viral infection of the respiratory tract. It occurs more often in winter months because people spend more time in close contact with one another. Influenza can make you feel very sick. Influenza easily spreads from person to person (contagious). CAUSES  Influenza is caused by a virus that infects the respiratory tract. You can catch the virus by breathing in droplets from an infected person's cough or sneeze. You can also catch the virus by touching something that was recently contaminated with the virus and then touching your mouth, nose, or eyes. RISKS AND COMPLICATIONS You may be at risk for a more severe case of influenza if you smoke cigarettes, have diabetes, have chronic heart disease (such as heart failure) or lung disease (such as asthma), or if you have a weakened immune system. Elderly people and pregnant women are also at risk for more serious infections. The most common problem of influenza is a lung infection (pneumonia). Sometimes, this problem can require emergency medical care and may be life threatening. SIGNS AND SYMPTOMS  Symptoms typically last 4 to 10 days and may include:  Fever.  Chills.  Headache, body aches, and muscle aches.  Sore throat.  Chest discomfort and cough.  Poor appetite.  Weakness or feeling tired.  Dizziness.  Nausea or vomiting. DIAGNOSIS  Diagnosis of influenza is often made based on your history and a physical exam. A nose or throat swab test can be done to confirm the diagnosis. TREATMENT  In mild cases, influenza goes away on its own. Treatment is directed at relieving symptoms. For more severe cases, your health care provider may prescribe antiviral medicines to shorten the sickness. Antibiotic medicines are not effective because the infection is caused by a  virus, not by bacteria. HOME CARE INSTRUCTIONS  Take medicines only as directed by your health care provider.  Use a cool mist humidifier to make breathing easier.  Get plenty of rest until your temperature returns to normal. This usually takes 3 to 4 days.  Drink enough fluid to keep your urine clear or pale yellow.  Cover yourmouth and nosewhen coughing or sneezing,and wash your handswellto prevent thevirusfrom spreading.  Stay homefromwork orschool untilthe fever is gonefor at least 36full day. PREVENTION  An annual influenza vaccination (flu shot) is the best way to avoid getting influenza. An annual flu shot is now routinely recommended for all adults in the [place] IF:  You experiencechest pain, yourcough worsens,or you producemore mucus.  Youhave nausea,vomiting, ordiarrhea.  Your fever returns or gets worse. SEEK IMMEDIATE MEDICAL CARE IF:  You havetrouble breathing, you become short of breath,or your skin ornails becomebluish.  You have severe painor stiffnessin the neck.  You develop a sudden headache, or pain in the face or ear.  You have nausea or vomiting that you cannot control. MAKE SURE YOU:   Understand these instructions.  Will watch your condition.  Will get help right away if you are not doing well or get worse.   This information is not intended to replace advice given to you by your health care provider. Make sure you discuss any questions you have with your health care provider.   Document Released: 09/30/2000 Document Revised: 10/24/2014 Document Reviewed: 01/02/2012 Elsevier Interactive Patient Education [employer].

## 2016-01-25 NOTE — Telephone Encounter (Signed)
Refill request for Olmesartan, Metformin, Metoprolol, Spironolactone, Furosemide and send to [facility]. I sent all scripts e-scribe and spoke with pt. Looks like he is due for some labs. I will forward to Dr. [name] to review, not sure if pt needs office visit for just follow up labs?

## 2016-01-26 ENCOUNTER — Telehealth: Payer: Self-pay | Admitting: Family Medicine

## 2016-01-26 MED ORDER — BENZONATATE 100 MG PO CAPS: 100.0000 mg | ORAL_CAPSULE | Freq: Three times a day (TID) | ORAL | Status: DC | PRN

## 2016-01-26 NOTE — Addendum Note (Signed)
Addended by: Agnes Lawrence on: 01/26/2016 01:23 PM   Modules accepted: Orders

## 2016-01-26 NOTE — Telephone Encounter (Signed)
Okay to send Tessalon Perles 100 mg, tid prn, #20. Advised prompt evaluation if running higher fevers again, difficulty breathing or is not improving as expected.

## 2016-01-26 NOTE — Telephone Encounter (Signed)
Pt saw Dr Maudie Mercury yesterday and states he is worse today. Thought he was better, but has taken a turn in the opposite direction.. Pt states his cough is worse, ribs ache from so much coughing. Pt is running low grade fever.  Would like a cough medicine.  [facility]/ [address]

## 2016-01-26 NOTE — Telephone Encounter (Signed)
I called the pt and informed him of the message below and he is aware the Rx was sent to his pharmacy. 

## 2016-01-31 NOTE — Telephone Encounter (Signed)
Have him see me OV soon

## 2016-02-01 NOTE — Telephone Encounter (Signed)
Can you call pt to offer appointment? Also pt should fast, he will see the doctor and then do lab work.

## 2016-02-02 NOTE — Telephone Encounter (Signed)
No answer and no voice mail. Will call again later.

## 2016-02-03 NOTE — Telephone Encounter (Signed)
Pt has been scheduled a morning appointment and will come in fasting.

## 2016-02-04 NOTE — Telephone Encounter (Signed)
Pt has been scheduled for a morning appt and is aware to fast.

## 2016-02-24 ENCOUNTER — Ambulatory Visit: Payer: Medicare Other | Admitting: Family Medicine

## 2016-02-24 ENCOUNTER — Encounter: Payer: Self-pay | Admitting: Family Medicine

## 2016-02-24 ENCOUNTER — Ambulatory Visit (INDEPENDENT_AMBULATORY_CARE_PROVIDER_SITE_OTHER): Payer: Medicare Other | Admitting: Family Medicine

## 2016-02-24 VITALS — BP 160/90 | Temp 97.9°F | Ht 76.0 in | Wt 388.0 lb

## 2016-02-24 DIAGNOSIS — I5022 Chronic systolic (congestive) heart failure: Secondary | ICD-10-CM

## 2016-02-24 DIAGNOSIS — I10 Essential (primary) hypertension: Secondary | ICD-10-CM

## 2016-02-24 DIAGNOSIS — N138 Other obstructive and reflux uropathy: Secondary | ICD-10-CM

## 2016-02-24 DIAGNOSIS — E119 Type 2 diabetes mellitus without complications: Secondary | ICD-10-CM

## 2016-02-24 DIAGNOSIS — Z23 Encounter for immunization: Secondary | ICD-10-CM

## 2016-02-24 DIAGNOSIS — E785 Hyperlipidemia, unspecified: Secondary | ICD-10-CM | POA: Diagnosis not present

## 2016-02-24 DIAGNOSIS — N401 Enlarged prostate with lower urinary tract symptoms: Secondary | ICD-10-CM

## 2016-02-24 LAB — POC URINALSYSI DIPSTICK (AUTOMATED)
BILIRUBIN UA: NEGATIVE
Blood, UA: NEGATIVE
GLUCOSE UA: NEGATIVE
KETONES UA: NEGATIVE
LEUKOCYTES UA: NEGATIVE
Nitrite, UA: NEGATIVE
PROTEIN UA: NEGATIVE
SPEC GRAV UA: 1.02
Urobilinogen, UA: 0.2
pH, UA: 5.5

## 2016-02-24 LAB — CBC WITH DIFFERENTIAL/PLATELET
BASOS PCT: 0.4 % (ref 0.0–3.0)
Basophils Absolute: 0 10*3/uL (ref 0.0–0.1)
EOS PCT: 3.6 % (ref 0.0–5.0)
Eosinophils Absolute: 0.3 10*3/uL (ref 0.0–0.7)
HCT: 45.5 % (ref 39.0–52.0)
Hemoglobin: 15.3 g/dL (ref 13.0–17.0)
LYMPHS ABS: 1.8 10*3/uL (ref 0.7–4.0)
Lymphocytes Relative: 22.6 % (ref 12.0–46.0)
MCHC: 33.5 g/dL (ref 30.0–36.0)
MCV: 86.6 fl (ref 78.0–100.0)
MONO ABS: 0.5 10*3/uL (ref 0.1–1.0)
MONOS PCT: 6.3 % (ref 3.0–12.0)
NEUTROS PCT: 67.1 % (ref 43.0–77.0)
Neutro Abs: 5.3 10*3/uL (ref 1.4–7.7)
Platelets: 233 10*3/uL (ref 150.0–400.0)
RBC: 5.26 Mil/uL (ref 4.22–5.81)
RDW: 14.5 % (ref 11.5–15.5)
WBC: 7.9 10*3/uL (ref 4.0–10.5)

## 2016-02-24 LAB — BASIC METABOLIC PANEL
BUN: 20 mg/dL (ref 6–23)
CALCIUM: 9.8 mg/dL (ref 8.4–10.5)
CHLORIDE: 101 meq/L (ref 96–112)
CO2: 27 meq/L (ref 19–32)
Creatinine, Ser: 0.94 mg/dL (ref 0.40–1.50)
GFR: 84.38 mL/min (ref >60.00)
GLUCOSE: 124 mg/dL — AB (ref 70–99)
Potassium: 4.3 mEq/L (ref 3.5–5.1)
SODIUM: 141 meq/L (ref 135–145)

## 2016-02-24 LAB — HEPATIC FUNCTION PANEL
ALK PHOS: 63 U/L (ref 39–117)
ALT: 87 U/L — AB (ref 0–53)
AST: 47 U/L — AB (ref 0–37)
Albumin: 4.2 g/dL (ref 3.5–5.2)
BILIRUBIN DIRECT: 0.1 mg/dL (ref 0.0–0.3)
BILIRUBIN TOTAL: 0.5 mg/dL (ref 0.2–1.2)
TOTAL PROTEIN: 7.1 g/dL (ref 6.0–8.3)

## 2016-02-24 LAB — LIPID PANEL
Cholesterol: 249 mg/dL — ABNORMAL HIGH (ref 0–200)
HDL: 47.1 mg/dL (ref >39.00)
NONHDL: 201.87
Total CHOL/HDL Ratio: 5
Triglycerides: 228 mg/dL — ABNORMAL HIGH (ref 0.0–149.0)
VLDL: 45.6 mg/dL — ABNORMAL HIGH (ref 0.0–40.0)

## 2016-02-24 LAB — PSA: PSA: 0.35 ng/mL (ref 0.10–4.00)

## 2016-02-24 LAB — LDL CHOLESTEROL, DIRECT: Direct LDL: 180 mg/dL

## 2016-02-24 LAB — HEMOGLOBIN A1C: HEMOGLOBIN A1C: 6.6 % — AB (ref 4.6–6.5)

## 2016-02-24 LAB — TSH: TSH: 2.32 u[IU]/mL (ref 0.35–4.50)

## 2016-02-24 MED ORDER — FUROSEMIDE 40 MG PO TABS: 40.0000 mg | ORAL_TABLET | Freq: Every day | ORAL | Status: DC

## 2016-02-24 MED ORDER — AMLODIPINE BESYLATE 5 MG PO TABS: 5.0000 mg | ORAL_TABLET | Freq: Every day | ORAL | Status: DC

## 2016-02-24 MED ORDER — METOPROLOL SUCCINATE ER 200 MG PO TB24: 200.0000 mg | ORAL_TABLET | Freq: Every day | ORAL | Status: DC

## 2016-02-24 MED ORDER — SPIRONOLACTONE 25 MG PO TABS: 25.0000 mg | ORAL_TABLET | Freq: Every day | ORAL | Status: DC

## 2016-02-24 MED ORDER — OLMESARTAN MEDOXOMIL 40 MG PO TABS: 40.0000 mg | ORAL_TABLET | Freq: Every day | ORAL | Status: DC

## 2016-02-24 MED ORDER — METFORMIN HCL 500 MG PO TABS: 500.0000 mg | ORAL_TABLET | Freq: Two times a day (BID) | ORAL | Status: DC

## 2016-02-24 NOTE — Progress Notes (Signed)
Pre visit review using our clinic review tool, if applicable. No additional management support is needed unless otherwise documented below in the visit note. 

## 2016-02-24 NOTE — Progress Notes (Signed)
   Subjective:    Patient ID: [name], male    DOB: [birth year], 70 y.o.   MRN: [id number]  HPI 70 yr old male to follow up on issues like HTN, DM, and dyslipidemia. He feels well except for occasional bouts of lightheadedness which I suspect are related to high BPs. He says he watches his diet but he gets little exercise, and he has not lost any weight. He has been very busy lately caring for his 33 yr old mother and trying to place her in a long term living facility.    Review of Systems  Constitutional: Negative.   HENT: Negative.   Eyes: Negative.   Respiratory: Negative.   Cardiovascular: Negative.   Gastrointestinal: Negative.   Genitourinary: Negative.   Musculoskeletal: Negative.   Skin: Negative.   Neurological: Positive for light-headedness. Negative for dizziness, tremors, seizures, syncope, facial asymmetry, speech difficulty, weakness, numbness and headaches.  Psychiatric/Behavioral: Negative.        Objective:   Physical Exam  Constitutional: He is oriented to person, place, and time. No distress.  Morbidly obese  HENT:  Head: Normocephalic and atraumatic.  Right Ear: External ear normal.  Left Ear: External ear normal.  Nose: Nose normal.  Mouth/Throat: Oropharynx is clear and moist. No oropharyngeal exudate.  Eyes: Conjunctivae and EOM are normal. Pupils are equal, round, and reactive to light. Right eye exhibits no discharge. Left eye exhibits no discharge. No scleral icterus.  Neck: Neck supple. No JVD present. No tracheal deviation present. No thyromegaly present.  Cardiovascular: Normal rate, regular rhythm, normal heart sounds and intact distal pulses.  Exam reveals no gallop and no friction rub.   No murmur heard. Pulmonary/Chest: Effort normal and breath sounds normal. No respiratory distress. He has no wheezes. He has no rales. He exhibits no tenderness.  Abdominal: Soft. Bowel sounds are normal. He exhibits no distension and no mass. There is no  tenderness. There is no rebound and no guarding.  Genitourinary: Rectum normal, prostate normal and penis normal. Guaiac negative stool. No penile tenderness.  Musculoskeletal: Normal range of motion. He exhibits no edema or tenderness.  Lymphadenopathy:    He has no cervical adenopathy.  Neurological: He is alert and oriented to person, place, and time. He has normal reflexes. No cranial nerve deficit. He exhibits normal muscle tone. Coordination normal.  Skin: Skin is warm and dry. No rash noted. He is not diaphoretic. No erythema. No pallor.  Psychiatric: He has a normal mood and affect. His behavior is normal. Judgment and thought content normal.          Assessment & Plan:  His HTN is not controlled so we will add Amlodipine 5 mg daily to his meds. Get fasting labs today to check on his lipids and diabetes. We discussed diet and exercise and I urged him to lose weight. I again recommended he get a colonoscopy, but he again declined.  [name], MD

## 2016-02-24 NOTE — Addendum Note (Signed)
Addended by: Aggie Hacker A on: 02/24/2016 09:46 AM   Modules accepted: Orders

## 2016-02-29 MED ORDER — ATORVASTATIN CALCIUM 20 MG PO TABS: 20.0000 mg | ORAL_TABLET | Freq: Every day | ORAL | Status: DC

## 2016-02-29 NOTE — Addendum Note (Signed)
Addended by: Aggie Hacker A on: 02/29/2016 12:01 PM   Modules accepted: Orders

## 2016-03-30 ENCOUNTER — Encounter: Payer: Self-pay | Admitting: Family Medicine

## 2016-03-30 ENCOUNTER — Ambulatory Visit (INDEPENDENT_AMBULATORY_CARE_PROVIDER_SITE_OTHER): Payer: Medicare Other | Admitting: Family Medicine

## 2016-03-30 VITALS — BP 136/70 | HR 97 | Temp 98.3°F | Ht 76.0 in | Wt 385.0 lb

## 2016-03-30 DIAGNOSIS — M722 Plantar fascial fibromatosis: Secondary | ICD-10-CM

## 2016-03-30 NOTE — Progress Notes (Signed)
Pre visit review using our clinic review tool, if applicable. No additional management support is needed unless otherwise documented below in the visit note. 

## 2016-03-30 NOTE — Progress Notes (Signed)
   Subjective:    Patient ID: [name], male    DOB: 02/03/1946, 70 y.o.   MRN: [id number]  HPI Here for one week of pain on the bottom of the left foot and around the heel. No swelling or redness or warmth. He had been wearing a thin pair of shoes in which the arch was growing thin. Now he has switched to a pair of Crocs and the foot feels better.    Review of Systems  Constitutional: Negative.   Musculoskeletal: Positive for arthralgias.       Objective:   Physical Exam  Constitutional: He appears well-developed and well-nourished.  Musculoskeletal:  Tender on the inferior surface of the left heel and the arch          Assessment & Plan:  Plantar fasciitis, treat by taking 2 Aleve tablets bid prn. Wear supportive shoes and add gel arch supports. Recheck prn. [name], MD

## 2016-09-02 ENCOUNTER — Ambulatory Visit (INDEPENDENT_AMBULATORY_CARE_PROVIDER_SITE_OTHER): Payer: Medicare Other | Admitting: Family Medicine

## 2016-09-02 ENCOUNTER — Encounter: Payer: Self-pay | Admitting: Family Medicine

## 2016-09-02 VITALS — BP 118/70 | HR 72 | Temp 98.5°F | Ht 76.0 in | Wt 394.0 lb

## 2016-09-02 DIAGNOSIS — I10 Essential (primary) hypertension: Secondary | ICD-10-CM | POA: Diagnosis not present

## 2016-09-02 DIAGNOSIS — Z23 Encounter for immunization: Secondary | ICD-10-CM

## 2016-09-02 DIAGNOSIS — R1013 Epigastric pain: Secondary | ICD-10-CM | POA: Diagnosis not present

## 2016-09-02 DIAGNOSIS — I428 Other cardiomyopathies: Secondary | ICD-10-CM

## 2016-09-02 NOTE — Progress Notes (Signed)
Pre visit review using our clinic review tool, if applicable. No additional management support is needed unless otherwise documented below in the visit note. 

## 2016-09-02 NOTE — Progress Notes (Signed)
   Subjective:    Patient ID: [name], male    DOB: 11-11-1945, 70 y.o.   MRN: [id number]  HPI Here to check his BP and to discuss an episode of epigastric pressure yesterday. After drinking some club soda he felt a sudden build up of pressure in the epigastrium. No nausea or SOB. After 2 large belches he felt fine again and he has had no further problems like this since then. He has not checked his BP for a while.    Review of Systems  Constitutional: Negative.   Respiratory: Negative.   Cardiovascular: Negative.   Neurological: Negative.        Objective:   Physical Exam  Constitutional: He is oriented to person, place, and time. He appears well-developed and well-nourished.  Cardiovascular: Normal rate, regular rhythm, normal heart sounds and intact distal pulses.   Pulmonary/Chest: Effort normal and breath sounds normal.  Abdominal: Soft. Bowel sounds are normal. He exhibits no distension and no mass. There is no tenderness. There is no rebound and no guarding.  Neurological: He is alert and oriented to person, place, and time.          Assessment & Plan:  The episode yesterday of epigastric pain was likely just the effects of carbonation in the soda. It does not sound like a cardiac issue. His BP is stable. Recheck prn. [name], MD

## 2016-09-02 NOTE — Addendum Note (Signed)
Addended by: Aggie Hacker A on: 09/02/2016 03:49 PM   Modules accepted: Orders

## 2016-11-22 ENCOUNTER — Ambulatory Visit (INDEPENDENT_AMBULATORY_CARE_PROVIDER_SITE_OTHER): Payer: Medicare Other | Admitting: Family Medicine

## 2016-11-22 ENCOUNTER — Encounter: Payer: Self-pay | Admitting: Family Medicine

## 2016-11-22 VITALS — BP 142/88 | Temp 98.0°F | Ht 76.0 in | Wt >= 6400 oz

## 2016-11-22 DIAGNOSIS — Z23 Encounter for immunization: Secondary | ICD-10-CM | POA: Diagnosis not present

## 2016-11-22 DIAGNOSIS — M545 Low back pain, unspecified: Secondary | ICD-10-CM

## 2016-11-22 MED ORDER — CYCLOBENZAPRINE HCL 10 MG PO TABS: 10.0000 mg | ORAL_TABLET | Freq: Three times a day (TID) | ORAL | 2 refills | Status: DC | PRN

## 2016-11-22 MED ORDER — METHYLPREDNISOLONE 4 MG PO TBPK: ORAL_TABLET | ORAL | 0 refills | Status: DC

## 2016-11-22 NOTE — Progress Notes (Signed)
Pre visit review using our clinic review tool, if applicable. No additional management support is needed unless otherwise documented below in the visit note. 

## 2016-11-22 NOTE — Progress Notes (Signed)
   Subjective:    Patient ID: [name], male    DOB: 1945/11/06, 71 y.o.   MRN: [id number]  HPI Here for one week of low back pain. No recent trauma. No pain in the legs. Heat and Advil help.    Review of Systems  Constitutional: Negative.   Respiratory: Negative.   Cardiovascular: Negative.   Musculoskeletal: Positive for back pain.  Neurological: Negative.        Objective:   Physical Exam  Constitutional: He appears well-developed and well-nourished.  Cardiovascular: Normal rate, regular rhythm, normal heart sounds and intact distal pulses.   Pulmonary/Chest: Effort normal and breath sounds normal.  Musculoskeletal:  Tender over the lower back, full ROM, negative SLR           Assessment & Plan:  Low back pain, treat with Flexeril and a Medrol dose pack. Alysia Penna, MD

## 2016-11-22 NOTE — Addendum Note (Signed)
Addended by: Aggie Hacker A on: 11/22/2016 04:04 PM   Modules accepted: Orders

## 2016-12-08 ENCOUNTER — Encounter: Payer: Self-pay | Admitting: Family Medicine

## 2016-12-09 ENCOUNTER — Encounter: Payer: Self-pay | Admitting: Family Medicine

## 2016-12-09 ENCOUNTER — Ambulatory Visit (INDEPENDENT_AMBULATORY_CARE_PROVIDER_SITE_OTHER): Payer: Medicare Other | Admitting: Family Medicine

## 2016-12-09 VITALS — BP 134/80 | Temp 98.0°F | Ht 76.0 in | Wt 395.8 lb

## 2016-12-09 DIAGNOSIS — R413 Other amnesia: Secondary | ICD-10-CM

## 2016-12-09 NOTE — Progress Notes (Signed)
   Subjective:    Patient ID: [name], male    DOB: [birth year], 71 y.o.   MRN: [id number]  HPI Here asking about memory issues. He has noticed a slight worsening of things like going into a room and forgetting why he was there, forgetting to run errands that his wife has asked him to do, etc. He does not forget phone numbers or birthdays, he does not get lost driving, etc. He feels well in general but he admits to dealing with a lot of stress with his mother's health issues.    Review of Systems  Constitutional: Negative.   Respiratory: Negative.   Cardiovascular: Negative.   Neurological: Negative.        Objective:   Physical Exam  Constitutional: He is oriented to person, place, and time. He appears well-developed and well-nourished.  Cardiovascular: Normal rate, regular rhythm, normal heart sounds and intact distal pulses.   Pulmonary/Chest: Effort normal and breath sounds normal.  Neurological: He is alert and oriented to person, place, and time.  Psychiatric: He has a normal mood and affect. His behavior is normal. Judgment and thought content normal.          Assessment & Plan:  He seems to be fine. I think the degree of his memory issue is normal given the stressful situation he is dealing with. He will follow up if this gets any worse however.  Alysia Penna, MD

## 2017-01-02 ENCOUNTER — Encounter: Payer: Self-pay | Admitting: Family Medicine

## 2017-01-02 ENCOUNTER — Ambulatory Visit (INDEPENDENT_AMBULATORY_CARE_PROVIDER_SITE_OTHER): Payer: Medicare Other | Admitting: Family Medicine

## 2017-01-02 VITALS — BP 136/70 | Temp 97.5°F | Ht 76.0 in | Wt 396.0 lb

## 2017-01-02 DIAGNOSIS — I10 Essential (primary) hypertension: Secondary | ICD-10-CM

## 2017-01-02 DIAGNOSIS — F411 Generalized anxiety disorder: Secondary | ICD-10-CM | POA: Diagnosis not present

## 2017-01-02 DIAGNOSIS — E119 Type 2 diabetes mellitus without complications: Secondary | ICD-10-CM | POA: Diagnosis not present

## 2017-01-02 MED ORDER — FLUOXETINE HCL 20 MG PO TABS: 20.0000 mg | ORAL_TABLET | Freq: Every day | ORAL | 3 refills | Status: DC

## 2017-01-02 NOTE — Progress Notes (Signed)
   Subjective:    Patient ID: [name], male    DOB: 15-Sep-1946, 71 y.o.   MRN: [id number]  HPI Here with his wife for several issues. First he wants to check his BP. He also wants to check his diabetes, since he does not check glucoses at home. his last A1c in May 2017 was 6.6. His main concern today is stress and how it is affecting him. He is under tremendous financial stress. He has sold all his retirement funds and he cannot pay the bills he has now. Part of his expenses are caring for his elderly mother. He feels anxious and depressed at times, he gets overwhelmed, and he feels like itis hard to focus on the things he has to do. He has trouble sleeping.    Review of Systems  Constitutional: Positive for fatigue.  Respiratory: Negative.   Cardiovascular: Negative.   Neurological: Negative.   Psychiatric/Behavioral: Positive for decreased concentration, dysphoric mood and sleep disturbance. Negative for agitation, behavioral problems, confusion, hallucinations, self-injury and suicidal ideas. The patient is nervous/anxious. The patient is not hyperactive.        Objective:   Physical Exam  Constitutional: He is oriented to person, place, and time. He appears well-developed and well-nourished.  Cardiovascular: Normal rate, regular rhythm, normal heart sounds and intact distal pulses.   Pulmonary/Chest: Effort normal and breath sounds normal.  Neurological: He is alert and oriented to person, place, and time.  Psychiatric: His behavior is normal. Judgment and thought content normal.  Very anxious           Assessment & Plan:  Anxiety, treat with Prozac 20 mg daily. Try to get some exercise and lose weight. Get labs today including an A1c. We spent 40 minutes today discussing these issues.  Alysia Penna, MD

## 2017-01-02 NOTE — Progress Notes (Signed)
Pre visit review using our clinic review tool, if applicable. No additional management support is needed unless otherwise documented below in the visit note. 

## 2017-01-02 NOTE — Patient Instructions (Signed)
WE NOW OFFER   [facility] FAST TRACK!!!  SAME DAY Appointments for ACUTE CARE  Such as: Sprains, Injuries, cuts, abrasions, rashes, muscle pain, joint pain, back pain Colds, flu, sore throats, headache, allergies, cough, fever  Ear pain, sinus and eye infections Abdominal pain, nausea, vomiting, diarrhea, upset stomach Animal/insect bites  3 Easy Ways to Schedule: Walk-In Scheduling Call in scheduling Mychart Sign-up: [email or web address]         

## 2017-01-03 LAB — CBC WITH DIFFERENTIAL/PLATELET
Basophils Absolute: 0.1 10*3/uL (ref 0.0–0.1)
Basophils Relative: 0.8 % (ref 0.0–3.0)
EOS PCT: 3.1 % (ref 0.0–5.0)
Eosinophils Absolute: 0.3 10*3/uL (ref 0.0–0.7)
HCT: 47.9 % (ref 39.0–52.0)
Hemoglobin: 16.1 g/dL (ref 13.0–17.0)
LYMPHS ABS: 2.4 10*3/uL (ref 0.7–4.0)
Lymphocytes Relative: 21.5 % (ref 12.0–46.0)
MCHC: 33.5 g/dL (ref 30.0–36.0)
MCV: 86.9 fl (ref 78.0–100.0)
MONO ABS: 0.8 10*3/uL (ref 0.1–1.0)
MONOS PCT: 6.8 % (ref 3.0–12.0)
NEUTROS ABS: 7.4 10*3/uL (ref 1.4–7.7)
NEUTROS PCT: 67.8 % (ref 43.0–77.0)
PLATELETS: 286 10*3/uL (ref 150.0–400.0)
RBC: 5.52 Mil/uL (ref 4.22–5.81)
RDW: 14.7 % (ref 11.5–15.5)
WBC: 11 10*3/uL — ABNORMAL HIGH (ref 4.0–10.5)

## 2017-01-03 LAB — HEPATIC FUNCTION PANEL
ALBUMIN: 4.3 g/dL (ref 3.5–5.2)
ALK PHOS: 85 U/L (ref 39–117)
ALT: 74 U/L — AB (ref 0–53)
AST: 42 U/L — AB (ref 0–37)
BILIRUBIN DIRECT: 0.1 mg/dL (ref 0.0–0.3)
BILIRUBIN TOTAL: 0.6 mg/dL (ref 0.2–1.2)
Total Protein: 7.3 g/dL (ref 6.0–8.3)

## 2017-01-03 LAB — TSH: TSH: 3.38 u[IU]/mL (ref 0.35–4.50)

## 2017-01-03 LAB — BASIC METABOLIC PANEL
BUN: 17 mg/dL (ref 6–23)
CHLORIDE: 100 meq/L (ref 96–112)
CO2: 29 mEq/L (ref 19–32)
Calcium: 10.2 mg/dL (ref 8.4–10.5)
Creatinine, Ser: 1.1 mg/dL (ref 0.40–1.50)
GFR: 70.21 mL/min (ref >60.00)
Glucose, Bld: 123 mg/dL — ABNORMAL HIGH (ref 70–99)
POTASSIUM: 4.2 meq/L (ref 3.5–5.1)
SODIUM: 140 meq/L (ref 135–145)

## 2017-01-03 LAB — HEMOGLOBIN A1C: Hgb A1c MFr Bld: 7.2 % — ABNORMAL HIGH (ref 4.6–6.5)

## 2017-01-22 ENCOUNTER — Other Ambulatory Visit: Payer: Self-pay | Admitting: Family Medicine

## 2017-01-25 ENCOUNTER — Encounter: Payer: Self-pay | Admitting: Family Medicine

## 2017-01-25 ENCOUNTER — Ambulatory Visit (INDEPENDENT_AMBULATORY_CARE_PROVIDER_SITE_OTHER): Payer: Medicare Other | Admitting: Family Medicine

## 2017-01-25 VITALS — BP 126/72 | Temp 97.8°F | Ht 76.0 in | Wt 384.0 lb

## 2017-01-25 DIAGNOSIS — F411 Generalized anxiety disorder: Secondary | ICD-10-CM

## 2017-01-25 DIAGNOSIS — I10 Essential (primary) hypertension: Secondary | ICD-10-CM | POA: Diagnosis not present

## 2017-01-25 DIAGNOSIS — I5021 Acute systolic (congestive) heart failure: Secondary | ICD-10-CM

## 2017-01-25 NOTE — Patient Instructions (Signed)
WE NOW OFFER   [facility] FAST TRACK!!!  SAME DAY Appointments for ACUTE CARE  Such as: Sprains, Injuries, cuts, abrasions, rashes, muscle pain, joint pain, back pain Colds, flu, sore throats, headache, allergies, cough, fever  Ear pain, sinus and eye infections Abdominal pain, nausea, vomiting, diarrhea, upset stomach Animal/insect bites  3 Easy Ways to Schedule: Walk-In Scheduling Call in scheduling Mychart Sign-up: [email or web address]         

## 2017-01-25 NOTE — Progress Notes (Signed)
   Subjective:    Patient ID: [name], male    DOB: Jul 13, 1946, 71 y.o.   MRN: [id number]  HPI Here with his wife to follow up on anxiety and other issues. He was here a month ago discussing his high stress levels and he started on Prozac 20 mg daily. He is very pleased with how this has helped him. He feels more relaxed and he sleeps better. He had labs that day which were remarkable primarily for an A1c of 7.2. We advised him to watch the diet more carefully. His only complaint today is occasional lightheadedness which makes him feel unsteady on his feet at times. No dizziness. His BP has been well controlled, but has in fact been lower than usual.    Review of Systems  Constitutional: Negative.   Respiratory: Negative.   Cardiovascular: Negative.   Neurological: Positive for light-headedness. Negative for dizziness, tremors, seizures, syncope, facial asymmetry, speech difficulty, weakness, numbness and headaches.  Psychiatric/Behavioral: Negative for confusion, dysphoric mood, hallucinations and sleep disturbance. The patient is not nervous/anxious.        Objective:   Physical Exam  Constitutional: He is oriented to person, place, and time. He appears well-developed and well-nourished.  Cardiovascular: Normal rate, regular rhythm, normal heart sounds and intact distal pulses.   Pulmonary/Chest: Effort normal and breath sounds normal.  Neurological: He is alert and oriented to person, place, and time.  Psychiatric: He has a normal mood and affect. His behavior is normal. Thought content normal.          Assessment & Plan:  His anxiety is much improved and we agreed to stay on the current Prozac regimen. His BP is a little low and I think he has been getting some orthostasis. We will reduce the Metoprolol to 1/2 tab daily (100 mg). Recheck in 3 months.  Alysia Penna, MD

## 2017-01-25 NOTE — Progress Notes (Signed)
Pre visit review using our clinic review tool, if applicable. No additional management support is needed unless otherwise documented below in the visit note. 

## 2017-02-04 ENCOUNTER — Emergency Department (HOSPITAL_COMMUNITY)
Admission: EM | Admit: 2017-02-04 | Discharge: 2017-02-04 | Disposition: A | Discharge status: Home / Self Care | Payer: Medicare Other | Attending: Emergency Medicine | Admitting: Emergency Medicine

## 2017-02-04 ENCOUNTER — Encounter (HOSPITAL_COMMUNITY): Payer: Self-pay | Admitting: Vascular Surgery

## 2017-02-04 DIAGNOSIS — Z87891 Personal history of nicotine dependence: Secondary | ICD-10-CM | POA: Insufficient documentation

## 2017-02-04 DIAGNOSIS — I110 Hypertensive heart disease with heart failure: Secondary | ICD-10-CM | POA: Insufficient documentation

## 2017-02-04 DIAGNOSIS — I10 Essential (primary) hypertension: Secondary | ICD-10-CM | POA: Diagnosis not present

## 2017-02-04 DIAGNOSIS — R030 Elevated blood-pressure reading, without diagnosis of hypertension: Secondary | ICD-10-CM | POA: Diagnosis not present

## 2017-02-04 DIAGNOSIS — I5023 Acute on chronic systolic (congestive) heart failure: Secondary | ICD-10-CM | POA: Insufficient documentation

## 2017-02-04 DIAGNOSIS — R531 Weakness: Secondary | ICD-10-CM | POA: Diagnosis not present

## 2017-02-04 DIAGNOSIS — Z7982 Long term (current) use of aspirin: Secondary | ICD-10-CM | POA: Insufficient documentation

## 2017-02-04 DIAGNOSIS — Z7984 Long term (current) use of oral hypoglycemic drugs: Secondary | ICD-10-CM | POA: Diagnosis not present

## 2017-02-04 DIAGNOSIS — R42 Dizziness and giddiness: Secondary | ICD-10-CM | POA: Insufficient documentation

## 2017-02-04 LAB — CBC
HCT: 45.2 % (ref 39.0–52.0)
Hemoglobin: 15 g/dL (ref 13.0–17.0)
MCH: 29.3 pg (ref 26.0–34.0)
MCHC: 33.2 g/dL (ref 30.0–36.0)
MCV: 88.3 fL (ref 78.0–100.0)
PLATELETS: 225 10*3/uL (ref 150–400)
RBC: 5.12 MIL/uL (ref 4.22–5.81)
RDW: 13.7 % (ref 11.5–15.5)
WBC: 8.3 10*3/uL (ref 4.0–10.5)

## 2017-02-04 LAB — I-STAT TROPONIN, ED: Troponin i, poc: 0 ng/mL (ref 0.00–0.08)

## 2017-02-04 LAB — URINALYSIS, ROUTINE W REFLEX MICROSCOPIC
BILIRUBIN URINE: NEGATIVE
GLUCOSE, UA: NEGATIVE mg/dL
HGB URINE DIPSTICK: NEGATIVE
Ketones, ur: NEGATIVE mg/dL
Leukocytes, UA: NEGATIVE
Nitrite: NEGATIVE
PROTEIN: NEGATIVE mg/dL
SPECIFIC GRAVITY, URINE: 1.013 (ref 1.005–1.030)
pH: 5 (ref 5.0–8.0)

## 2017-02-04 LAB — BASIC METABOLIC PANEL
Anion gap: 13 (ref 5–15)
BUN: 16 mg/dL (ref 6–20)
CHLORIDE: 101 mmol/L (ref 101–111)
CO2: 23 mmol/L (ref 22–32)
CREATININE: 1.1 mg/dL (ref 0.61–1.24)
Calcium: 9.3 mg/dL (ref 8.9–10.3)
GFR calc non Af Amer: >60 mL/min (ref >60)
GLUCOSE: 133 mg/dL — AB (ref 65–99)
Potassium: 3.9 mmol/L (ref 3.5–5.1)
Sodium: 137 mmol/L (ref 135–145)

## 2017-02-04 LAB — CBG MONITORING, ED: Glucose-Capillary: 147 mg/dL — ABNORMAL HIGH (ref 65–99)

## 2017-02-04 NOTE — ED Triage Notes (Signed)
Pt reports to the ED via [facility] for eval of sudden onset of dizziness. Onset of dizziness occurred while he was sitting on his couch at rest. After the onset of the dizziness he got up and took 325 of ASA. Upon EMS arrival patient was warm, dry, and alert and oriented. He did have a recent change to his BP medication (metoprolol reduced by half and he was started on Prozac). Pt denies any hx of similar symptoms. 12 lead showed occasional PVCs and he is not aware of hx of PVCs. He was not orthostatic PTA. VSS and CBG 140 mg/dl en route. Denies any N/V, CP, or diaphoresis.

## 2017-02-04 NOTE — ED Provider Notes (Signed)
[facility] DEPT Provider Note   CSN: [id number] Arrival date & time: 02/04/17  1034     History   Chief Complaint Chief Complaint  Patient presents with  . Dizziness    HPI [name] is a 71 y.o. male.  Pt w PMHxCHF, T2DM, non-hodgkin lymphoma, presents w episode of lightheadedness this morning that lasted about 5 min and resolved without intervention. States he was sitting on the couch when he felt lightheaded and heard a "whooshing" sound in his ears. Reports that he was doing his bills and was stressed prior to this episode, though he says he was calm and not feeling anxious when this episode occurred. Denies SOB, CP, N/V, diaphoresis, vision changes, or LOC during the episode. Asx in ED. Recently started on prozac and recent decrease is metoprolol dose.       Past Medical History:  Diagnosis Date  . Chronic systolic CHF (congestive heart failure) ([place])    a. Echo (08/28/13): Mild LVH, EF 35-40%, diffuse HK, moderate to severe LAE.  . Diabetes mellitus without complication ([place])   . Diverticulitis   . Headache(784.0)   . Hyperlipidemia   . Hypertension   . Lymphoma, non Hodgkin's    sees Dr. [name]   . Morbid obesity ([place])   . NICM (nonischemic cardiomyopathy) ([place])    a. R/L Heart cath 08/30/13: RA mean 8, RV 30/0, PA 27/12, mean PCWP 9, CO 5.67, CI 1.98; normal coronary arteries  . Sleep apnea     Patient Active Problem List   Diagnosis Date Noted  . Anxiety, generalized 01/02/2017  . Type 2 diabetes, HbA1c goal < 7% (H[facility]) 11/25/2014  . Chronic systolic heart failure ([place]) 09/09/2013  . NICM (nonischemic cardiomyopathy) ([place]) 09/09/2013  . HLD (hyperlipidemia) 09/09/2013  . OSA (obstructive sleep apnea) 08/31/2013  . Acute systolic CHF (congestive heart failure), NYHA class 2 ([place]) 08/27/2013  . Acute respiratory failure ([place]) 08/27/2013  . ACUTE BRONCHITIS 11/12/2009  . LEG EDEMA 09/14/2009  . NON-HODGKIN'S LYMPHOMA 09/24/2008  . VERTIGO  09/24/2008  . ADENOCARCINOMA 11/14/2007  . ABDOMINAL MASS 10/26/2007  . Essential hypertension 10/23/2007  . NEPHROLITHIASIS 10/23/2007  . ENTERITIS, DUE TO VIRAL NEC 08/02/2007  . CHICKENPOX, HX OF 08/02/2007  . HEADACHE 06/07/2007  . DIVERTICULITIS, HX OF 06/07/2007    Past Surgical History:  Procedure Laterality Date  . HERNIA REPAIR     umblical  . incarcerated hernia     vental 11/19/08 Dr. [name]  . LEFT AND RIGHT HEART CATHETERIZATION WITH CORONARY ANGIOGRAM N/A 08/30/2013   Procedure: LEFT AND RIGHT HEART CATHETERIZATION WITH CORONARY ANGIOGRAM;  Surgeon: [name], MD;  Location: [facility] CATH LAB;  Service: Cardiovascular;  Laterality: N/A;       Home Medications    Prior to Admission medications   Medication Sig Start Date End Date Taking? Authorizing Provider  amLODipine (NORVASC) 5 MG tablet Take 1 tablet (5 mg total) by mouth daily. 02/24/16   [name], MD  aspirin 81 MG tablet Take 81 mg by mouth daily.    Historical Provider, MD  atorvastatin (LIPITOR) 20 MG tablet Take 1 tablet (20 mg total) by mouth daily. 02/29/16   [name], MD  beta carotene w/minerals (OCUVITE) tablet Take 1 tablet by mouth daily.    Historical Provider, MD  Cyanocobalamin (VITAMIN B 12 PO) Take by mouth daily.    Historical Provider, MD  cyclobenzaprine (FLEXERIL) 10 MG tablet Take 1 tablet (10 mg total) by mouth 3 (  three) times daily as needed for muscle spasms. 11/22/16   [name], MD  FLUoxetine (PROZAC) 20 MG tablet Take 1 tablet (20 mg total) by mouth daily. 01/02/17   [name], MD  furosemide (LASIX) 40 MG tablet take 1 tablet by mouth once daily 01/23/17   [name], MD  meclizine (ANTIVERT) 25 MG tablet Take 1 tablet (25 mg total) by mouth every 4 (four) hours as needed for dizziness. Patient not taking: Reported on 01/25/2017 12/17/13   [name], MD  metFORMIN (GLUCOPHAGE) 500 MG tablet take 1 tablet by mouth twice a day WITH A MEAL 01/23/17   [name], MD   metoprolol (TOPROL-XL) 200 MG 24 hr tablet take 1 tablet by mouth once daily 01/23/17   [name], MD  olmesartan [facility]) 40 MG tablet take 1 tablet by mouth once daily 01/23/17   [name], MD  Omega-3 Fatty Acids (OMEGA 3 PO) Take 2-3 capsules by mouth daily.     Historical Provider, MD  polyethylene glycol (MIRALAX / GLYCOLAX) packet Take 17 g by mouth daily as needed for moderate constipation.    Historical Provider, MD  spironolactone (ALDACTONE) 25 MG tablet take 1 tablet by mouth once daily 01/23/17   [name], MD    Family History Family History  Problem Relation Age of Onset  . Heart failure Mother   . Hypertension Mother   . Cancer Father     colon  . Hypertension Father   . Diabetes Father   . Stroke Maternal Grandmother   . Diabetes Paternal Grandfather   . Cancer Maternal Grandfather     Social History Social History  Substance Use Topics  . Smoking status: Former Smoker    Packs/day: 2.00    Years: 52.00    Types: Cigarettes    Start date: 08/27/1965    Quit date: 08/28/1999  . Smokeless tobacco: Never Used     Comment: quit 13 years ago  . Alcohol use 0.0 oz/week     Comment: occ     Allergies   Patient has no known allergies.   Review of Systems Review of Systems  Constitutional: Negative for diaphoresis.  Eyes: Negative for visual disturbance.  Respiratory: Negative for shortness of breath.   Cardiovascular: Negative for chest pain.  Gastrointestinal: Negative for nausea and vomiting.  Neurological: Positive for light-headedness. Negative for weakness and headaches.     Physical Exam Updated Vital Signs BP 112/64   Pulse (!) 58   Temp 98.4 F (36.9 C) (Oral)   Resp 18   SpO2 95%   Physical Exam  Constitutional: He appears well-developed and well-nourished.  HENT:  Head: Normocephalic and atraumatic.  Eyes: Conjunctivae and EOM are normal. Pupils are equal, round, and reactive to light.  Neck: Normal range of motion.    Cardiovascular: Normal rate, regular rhythm, normal heart sounds and intact distal pulses.  Exam reveals no friction rub.   No murmur heard. Pulmonary/Chest: Effort normal and breath sounds normal. No respiratory distress. He has no wheezes. He has no rales.  Musculoskeletal: Normal range of motion.  Neurological: He is alert. No cranial nerve deficit or sensory deficit. He exhibits normal muscle tone. Coordination normal.  Psychiatric: He has a normal mood and affect. His behavior is normal.  Nursing note and vitals reviewed.    ED Treatments / Results  Labs (all labs ordered are listed, but only abnormal results are displayed) Labs Reviewed  BASIC  METABOLIC PANEL - Abnormal; Notable for the following:       Result Value   Glucose, Bld 133 (*)    All other components within normal limits  CBG MONITORING, ED - Abnormal; Notable for the following:    Glucose-Capillary 147 (*)    All other components within normal limits  CBC  URINALYSIS, ROUTINE W REFLEX MICROSCOPIC  I-STAT TROPOININ, ED    EKG  EKG Interpretation  Date/Time:  Saturday February 04 2017 10:43:23 EDT Ventricular Rate:  64 PR Interval:    QRS Duration: 127 QT Interval:  433 QTC Calculation: 447 R Axis:   -16 Text Interpretation:  Sinus rhythm Prolonged PR interval Nonspecific intraventricular conduction delay Confirmed by COOK  MD, BRIAN ([id number]) on 02/04/2017 11:15:08 AM       Radiology No results found.  Procedures Procedures (including critical care time)  Medications Ordered in ED Medications - No data to display   Initial Impression / Assessment and Plan / ED Course  I have reviewed the triage vital signs and the nursing notes.  Pertinent labs & imaging results that were available during my care of the patient were reviewed by me and considered in my medical decision making (see chart for details).     Pt w 1 episode of lightheadedness. Neuro exam reassuring. Nl orthostatics. CBC, CMP wnl. Trop  neg. Pt is not anemic, not dehydrated. Sx not consistent w vertigo. Event could be d/t anxiety. Encouraged follow up w PCP. Pt hemodynamically stable, not in distress, safe for discharged home.   Patient discussed with and seen by Dr. Lacinda Axon.  Discussed results, findings, treatment and follow up. Patient advised of return precautions. Patient verbalized understanding and agreed with plan.   Final Clinical Impressions(s) / ED Diagnoses   Final diagnoses:  Lightheadedness    New Prescriptions New Prescriptions   No medications on file     [place] N Russo, PA-C 02/04/17 [place], MD 02/05/17 1023

## 2017-02-04 NOTE — Discharge Instructions (Signed)
Please read instructions below. Schedule an appointment with your primary care provider to follow up about your visit today. Return to the ER for new or worsening symptoms.

## 2017-02-14 ENCOUNTER — Ambulatory Visit (INDEPENDENT_AMBULATORY_CARE_PROVIDER_SITE_OTHER): Payer: Medicare Other | Admitting: Family Medicine

## 2017-02-14 ENCOUNTER — Encounter: Payer: Self-pay | Admitting: Family Medicine

## 2017-02-14 VITALS — BP 124/70 | Temp 97.9°F | Ht 76.0 in | Wt 383.0 lb

## 2017-02-14 DIAGNOSIS — F411 Generalized anxiety disorder: Secondary | ICD-10-CM | POA: Diagnosis not present

## 2017-02-14 DIAGNOSIS — I10 Essential (primary) hypertension: Secondary | ICD-10-CM | POA: Diagnosis not present

## 2017-02-14 DIAGNOSIS — R42 Dizziness and giddiness: Secondary | ICD-10-CM

## 2017-02-14 MED ORDER — MECLIZINE HCL 25 MG PO TABS
25.0000 mg | ORAL_TABLET | ORAL | 5 refills | Status: DC | PRN
Start: 2017-02-14 — End: 2019-04-08

## 2017-02-14 MED ORDER — METOPROLOL SUCCINATE ER 200 MG PO TB24: 100.0000 mg | ORAL_TABLET | Freq: Every day | ORAL | 11 refills | Status: DC

## 2017-02-14 NOTE — Progress Notes (Signed)
   Subjective:    Patient ID: [name], male    DOB: 09-Jul-1946, 71 y.o.   MRN: [id number]  HPI Here to follow up an ER visit on 02-04-17 for an episode of lightheadedness that suddenly came on while he was at home sitting at a desk. He describes some dizziness as well, though he did not feel a loss of balance that day. No headaches. He has also had some intermittent whooshing noises or ringing in both ears for 2 weeks. No vision changes, no slurred speech. At the ER all his labs returned as normal. His vital signs were normal, and he was not orthostatic. Since then he has had mild episodes of dizziness and the ringing in the ears comes and goes. Of note he had a spell of similar symptoms in 2010 and we worked this up with a brain MRI that was unremarkable. At that time he took Meclizine which was very successful at relieving the symptoms. Also since we reduced his Metoprolol to taking 1/2 tablet a few weeks ago, his BP has been quite stable.  Review of Systems  Constitutional: Negative.   HENT: Positive for tinnitus. Negative for congestion, ear pain, sinus pain and sinus pressure.   Eyes: Negative.   Respiratory: Negative.   Cardiovascular: Negative.   Neurological: Positive for dizziness and light-headedness. Negative for tremors, seizures, syncope, facial asymmetry, speech difficulty, weakness, numbness and headaches.       Objective:   Physical Exam  Constitutional: He is oriented to person, place, and time. He appears well-developed and well-nourished. No distress.  HENT:  Right Ear: External ear normal.  Left Ear: External ear normal.  Nose: Nose normal.  Mouth/Throat: Oropharynx is clear and moist.  Eyes: Conjunctivae and EOM are normal. Pupils are equal, round, and reactive to light.  Neck: Neck supple. No thyromegaly present.  Cardiovascular: Normal rate, regular rhythm, normal heart sounds and intact distal pulses.   Pulmonary/Chest: Effort normal and breath sounds  normal.  Lymphadenopathy:    He has no cervical adenopathy.  Neurological: He is alert and oriented to person, place, and time. No cranial nerve deficit. He exhibits normal muscle tone. Coordination normal.          Assessment & Plan:  He seems to be having another bout of vestibular dysfunction. We will treat again with Meclizine. If he does not respond we will investigate further. His HTN is well controlled. Also his anxiety is well controlled with Prozac.  Alysia Penna, MD

## 2017-02-14 NOTE — Progress Notes (Signed)
Pre visit review using our clinic review tool, if applicable. No additional management support is needed unless otherwise documented below in the visit note. 

## 2017-02-14 NOTE — Patient Instructions (Signed)
WE NOW OFFER   [name] FAST TRACK!!!  SAME DAY Appointments for ACUTE CARE  Such as: Sprains, Injuries, cuts, abrasions, rashes, muscle pain, joint pain, back pain Colds, flu, sore throats, headache, allergies, cough, fever  Ear pain, sinus and eye infections Abdominal pain, nausea, vomiting, diarrhea, upset stomach Animal/insect bites  3 Easy Ways to Schedule: Walk-In Scheduling Call in scheduling Mychart Sign-up: [email or web address]         

## 2017-02-19 ENCOUNTER — Other Ambulatory Visit: Payer: Self-pay | Admitting: Family Medicine

## 2017-03-24 ENCOUNTER — Other Ambulatory Visit: Payer: Self-pay | Admitting: Family Medicine

## 2017-04-27 ENCOUNTER — Other Ambulatory Visit: Payer: Self-pay | Admitting: Family Medicine

## 2017-05-06 ENCOUNTER — Other Ambulatory Visit: Payer: Self-pay | Admitting: Family Medicine

## 2017-06-15 ENCOUNTER — Other Ambulatory Visit: Payer: Self-pay | Admitting: Family Medicine

## 2017-07-06 ENCOUNTER — Encounter: Payer: Self-pay | Admitting: Family Medicine

## 2017-07-21 ENCOUNTER — Other Ambulatory Visit: Payer: Self-pay | Admitting: Family Medicine

## 2017-08-08 ENCOUNTER — Ambulatory Visit (INDEPENDENT_AMBULATORY_CARE_PROVIDER_SITE_OTHER): Payer: Medicare Other | Admitting: Family Medicine

## 2017-08-08 ENCOUNTER — Encounter: Payer: Self-pay | Admitting: Family Medicine

## 2017-08-08 VITALS — BP 142/80 | Temp 98.3°F | Ht 76.0 in | Wt 384.0 lb

## 2017-08-08 DIAGNOSIS — I10 Essential (primary) hypertension: Secondary | ICD-10-CM

## 2017-08-08 DIAGNOSIS — I499 Cardiac arrhythmia, unspecified: Secondary | ICD-10-CM

## 2017-08-08 DIAGNOSIS — Z23 Encounter for immunization: Secondary | ICD-10-CM | POA: Diagnosis not present

## 2017-08-08 DIAGNOSIS — E119 Type 2 diabetes mellitus without complications: Secondary | ICD-10-CM

## 2017-08-08 DIAGNOSIS — F411 Generalized anxiety disorder: Secondary | ICD-10-CM | POA: Diagnosis not present

## 2017-08-08 MED ORDER — FLUOXETINE HCL 40 MG PO CAPS: 40.0000 mg | ORAL_CAPSULE | Freq: Every day | ORAL | 3 refills | Status: DC

## 2017-08-08 MED ORDER — METOPROLOL SUCCINATE ER 200 MG PO TB24: 200.0000 mg | ORAL_TABLET | Freq: Every day | ORAL | 11 refills | Status: DC

## 2017-08-08 NOTE — Addendum Note (Signed)
Addended by: Aggie Hacker A on: 08/08/2017 05:29 PM   Modules accepted: Orders

## 2017-08-08 NOTE — Patient Instructions (Signed)
WE NOW OFFER   [facility] FAST TRACK!!!  SAME DAY Appointments for ACUTE CARE  Such as: Sprains, Injuries, cuts, abrasions, rashes, muscle pain, joint pain, back pain Colds, flu, sore throats, headache, allergies, cough, fever  Ear pain, sinus and eye infections Abdominal pain, nausea, vomiting, diarrhea, upset stomach Animal/insect bites  3 Easy Ways to Schedule: Walk-In Scheduling Call in scheduling Mychart Sign-up: [email or web address]         

## 2017-08-08 NOTE — Progress Notes (Signed)
   Subjective:    Patient ID: [name], male    DOB: 08-Aug-1946, 71 y.o.   MRN: [id number]  HPI Here to follow up on anxiety and HTN. He has been taking Prozac 20 mg a day since March, and at first this really helped his anxiety. Over the past few months however it has not been as effective. He feels anxious again and he feels overwhelmed by the tasks required of him at times. At our visit in March we decrease the Metoprolol dose by 1/2. Lately he has felt more tired than usual. He gets out of breath quickly when walknig distances or going up stairs. No chest pain. No palpitations.    Review of Systems  Constitutional: Negative.   Respiratory: Positive for shortness of breath. Negative for cough, chest tightness and wheezing.   Cardiovascular: Negative.   Neurological: Positive for light-headedness. Negative for dizziness, weakness, numbness and headaches.  Psychiatric/Behavioral: Positive for decreased concentration. Negative for agitation, confusion, dysphoric mood and hallucinations. The patient is nervous/anxious.        Objective:   Physical Exam  Constitutional: He is oriented to person, place, and time.  Morbidly obese  Neck: No thyromegaly present.  Cardiovascular: Normal rate, normal heart sounds and intact distal pulses.   Irregular rhythm . EKG shows sinus rhythm with frequent PVCs   Pulmonary/Chest: Effort normal and breath sounds normal. No respiratory distress. He has no wheezes. He has no rales.  Lymphadenopathy:    He has no cervical adenopathy.  Neurological: He is alert and oriented to person, place, and time.          Assessment & Plan:  His BP has gone back up a bit after decreasing the metoprolol dose, and I suspect this may have unmasked an underlying tendency toward ectopy. We will increase this back to a full pill a day (200 mg). For the anxiety we will increase the Prozac to 40 mg daily. Recheck in 2-3 weeks.  [name] Penna, MD

## 2018-01-09 ENCOUNTER — Other Ambulatory Visit: Payer: Self-pay | Admitting: Family Medicine

## 2018-01-24 ENCOUNTER — Other Ambulatory Visit: Payer: Self-pay | Admitting: Family Medicine

## 2018-02-11 ENCOUNTER — Other Ambulatory Visit: Payer: Self-pay | Admitting: Family Medicine

## 2018-02-12 ENCOUNTER — Other Ambulatory Visit: Payer: Self-pay | Admitting: Family Medicine

## 2018-02-12 NOTE — Telephone Encounter (Signed)
Left a message for a return call.  CRM created.  Pt now due for annual visit and labs.

## 2018-02-13 NOTE — Telephone Encounter (Signed)
Called pt and left a VM to give [place] a call back to get schedule for their [facility] and we can send in Rx in the mean time until there next appt.

## 2018-03-02 ENCOUNTER — Ambulatory Visit (INDEPENDENT_AMBULATORY_CARE_PROVIDER_SITE_OTHER): Payer: Medicare Other | Admitting: Family Medicine

## 2018-03-02 ENCOUNTER — Encounter: Payer: Self-pay | Admitting: Family Medicine

## 2018-03-02 VITALS — BP 130/82 | HR 74 | Temp 98.0°F | Wt 387.0 lb

## 2018-03-02 DIAGNOSIS — I10 Essential (primary) hypertension: Secondary | ICD-10-CM | POA: Diagnosis not present

## 2018-03-02 DIAGNOSIS — I5022 Chronic systolic (congestive) heart failure: Secondary | ICD-10-CM | POA: Diagnosis not present

## 2018-03-02 DIAGNOSIS — R42 Dizziness and giddiness: Secondary | ICD-10-CM

## 2018-03-02 LAB — POC URINALSYSI DIPSTICK (AUTOMATED)
Bilirubin, UA: NEGATIVE
GLUCOSE UA: NEGATIVE
KETONES UA: NEGATIVE
Leukocytes, UA: NEGATIVE
Nitrite, UA: NEGATIVE
Protein, UA: NEGATIVE
RBC UA: NEGATIVE
SPEC GRAV UA: 1.02 (ref 1.010–1.025)
Urobilinogen, UA: 0.2 E.U./dL
pH, UA: 6 (ref 5.0–8.0)

## 2018-03-02 NOTE — Progress Notes (Signed)
   Subjective:    Patient ID: [name], male    DOB: 10/01/1946, 72 y.o.   MRN: [id number]  HPI Here to follow up on HTN and to discuss brief spells of lightheadedness he gets when he gets up quickly, either from sitting or lying down. These last 5 to 20 seconds and then go away. No headache or SOB or chest pain. His weight is unchanged from last October. He does not check his BP at home.    Review of Systems  Constitutional: Negative.   Respiratory: Negative.   Cardiovascular: Negative.   Neurological: Positive for light-headedness. Negative for dizziness, tremors, seizures, syncope, facial asymmetry, speech difficulty, weakness, numbness and headaches.       Objective:   Physical Exam  Constitutional: He is oriented to person, place, and time.  Morbidly obese   Neck: No thyromegaly present.  Cardiovascular: Normal rate, regular rhythm, normal heart sounds and intact distal pulses.  Pulmonary/Chest: Effort normal and breath sounds normal. No stridor. No respiratory distress. He has no wheezes. He has no rales.  Musculoskeletal: He exhibits no edema.  Lymphadenopathy:    He has no cervical adenopathy.  Neurological: He is alert and oriented to person, place, and time. No cranial nerve deficit or sensory deficit. He exhibits normal muscle tone. Coordination normal.          Assessment & Plan:  His BP is well controlled. It is likely he is having some orthostatic changes. Get labs today to check renal function, etc. We will decrease the Metoprolol to 1/2 tablet (100 mg) daily. I encouraged him to purchase a BP cuff to follow the BP at home.  Alysia Penna, MD

## 2018-03-03 LAB — BASIC METABOLIC PANEL
BUN: 18 mg/dL (ref 7–25)
CALCIUM: 9.9 mg/dL (ref 8.6–10.3)
CHLORIDE: 101 mmol/L (ref 98–110)
CO2: 30 mmol/L (ref 20–32)
Creat: 1 mg/dL (ref 0.70–1.18)
Glucose, Bld: 131 mg/dL — ABNORMAL HIGH (ref 65–99)
POTASSIUM: 4.6 mmol/L (ref 3.5–5.3)
SODIUM: 142 mmol/L (ref 135–146)

## 2018-03-03 LAB — CBC WITH DIFFERENTIAL/PLATELET
BASOS ABS: 47 {cells}/uL (ref 0–200)
Basophils Relative: 0.5 %
Eosinophils Absolute: 282 cells/uL (ref 15–500)
Eosinophils Relative: 3 %
HEMATOCRIT: 47.9 % (ref 38.5–50.0)
HEMOGLOBIN: 16.4 g/dL (ref 13.2–17.1)
LYMPHS ABS: 1824 {cells}/uL (ref 850–3900)
MCH: 28.8 pg (ref 27.0–33.0)
MCHC: 34.2 g/dL (ref 32.0–36.0)
MCV: 84 fL (ref 80.0–100.0)
MPV: 9.8 fL (ref 7.5–12.5)
Monocytes Relative: 6.2 %
NEUTROS ABS: 6665 {cells}/uL (ref 1500–7800)
NEUTROS PCT: 70.9 %
Platelets: 289 10*3/uL (ref 140–400)
RBC: 5.7 10*6/uL (ref 4.20–5.80)
RDW: 13.5 % (ref 11.0–15.0)
Total Lymphocyte: 19.4 %
WBC: 9.4 10*3/uL (ref 3.8–10.8)
WBCMIX: 583 {cells}/uL (ref 200–950)

## 2018-03-03 LAB — TSH: TSH: 2.37 mIU/L (ref 0.40–4.50)

## 2018-03-29 ENCOUNTER — Other Ambulatory Visit: Payer: Self-pay | Admitting: Family Medicine

## 2018-04-20 ENCOUNTER — Encounter: Payer: Self-pay | Admitting: Family Medicine

## 2018-04-20 DIAGNOSIS — R413 Other amnesia: Secondary | ICD-10-CM

## 2018-04-20 NOTE — Telephone Encounter (Signed)
The referral was done  

## 2018-05-07 ENCOUNTER — Encounter: Payer: Self-pay | Admitting: Psychology

## 2018-05-18 ENCOUNTER — Encounter: Payer: Self-pay | Admitting: Family Medicine

## 2018-05-23 NOTE — Telephone Encounter (Signed)
Having hallucinations makes me think he should see a Psychiatrist, and I am sure he could get in sooner than January. No referral is needed. Have her make an appt with a Psychiatrist asap (she can find out who is on her insurance company's list)

## 2018-08-15 ENCOUNTER — Ambulatory Visit (INDEPENDENT_AMBULATORY_CARE_PROVIDER_SITE_OTHER): Payer: Medicare Other | Admitting: Family Medicine

## 2018-08-15 ENCOUNTER — Encounter: Payer: Self-pay | Admitting: Family Medicine

## 2018-08-15 VITALS — BP 128/80 | HR 74 | Temp 97.9°F | Wt 379.0 lb

## 2018-08-15 DIAGNOSIS — L409 Psoriasis, unspecified: Secondary | ICD-10-CM | POA: Diagnosis not present

## 2018-08-15 DIAGNOSIS — Z23 Encounter for immunization: Secondary | ICD-10-CM

## 2018-08-15 MED ORDER — CLOBETASOL PROPIONATE 0.05 % EX SOLN: 1.0000 "application " | Freq: Two times a day (BID) | CUTANEOUS | 2 refills | Status: DC

## 2018-08-15 NOTE — Progress Notes (Signed)
   Subjective:    Patient ID: [name], male    DOB: [birth year], 72 y.o.   MRN: [id number]  HPI Here for a rash on the scalp that itches and burns. This started about 2 months ago and it has been getting worse. OTC shampoos do not help. No other skin areas are involved.    Review of Systems  Constitutional: Negative.   Respiratory: Negative.   Cardiovascular: Negative.   Skin: Positive for rash.  Neurological: Negative.        Objective:   Physical Exam  Constitutional: He appears well-developed and well-nourished.  Cardiovascular: Normal rate, regular rhythm, normal heart sounds and intact distal pulses.  Pulmonary/Chest: Effort normal and breath sounds normal.  Skin:  Numerous red plaques over the scalp with excessive scaling of the skin           Assessment & Plan:  Psoriasis. Try Clobetasol solution bid. Refer to Dermatology for biologic treatments.  Alysia Penna, MD

## 2018-09-05 ENCOUNTER — Other Ambulatory Visit: Payer: Self-pay | Admitting: Family Medicine

## 2018-09-06 ENCOUNTER — Other Ambulatory Visit: Payer: Self-pay | Admitting: Family Medicine

## 2018-09-06 NOTE — Telephone Encounter (Signed)
Copied from [place] [id number]. Topic: Quick Communication - Rx Refill/Question >> Sep 06, 2018 11:33 AM Judyann Munson wrote: Medication: FLUoxetine (PROZAC) 40 MG capsule  Has the patient contacted their pharmacy? No  Preferred Pharmacy (with phone number or street name): [facility] [place], [address] AT [address] [place] [phone] (Phone) [phone] (Fax)    Agent: Please be advised that RX refills may take up to 3 business days. We ask that you follow-up with your pharmacy.

## 2018-09-07 MED ORDER — FLUOXETINE HCL 40 MG PO CAPS: 40.0000 mg | ORAL_CAPSULE | Freq: Every day | ORAL | 3 refills | Status: DC

## 2018-10-31 ENCOUNTER — Encounter: Payer: Self-pay | Admitting: Family Medicine

## 2018-11-01 ENCOUNTER — Encounter: Payer: Medicare Other | Admitting: Psychology

## 2018-11-01 ENCOUNTER — Encounter

## 2018-11-01 NOTE — Telephone Encounter (Signed)
Dr. Fry please advise. Thanks  

## 2018-11-05 NOTE — Telephone Encounter (Signed)
No I do not know of any other topical agents to use. He probably needs to be on a biologic agent to get this under control, and he will need to speak to the dermatologist about this.

## 2018-11-07 ENCOUNTER — Telehealth: Payer: Self-pay | Admitting: Family Medicine

## 2018-11-07 NOTE — Telephone Encounter (Signed)
Dr. Fry please advise on refill of medication.  Thanks  

## 2018-11-07 NOTE — Telephone Encounter (Signed)
Copied from [place] [id number]. Topic: Quick Communication - Rx Refill/Question >> Nov 07, 2018  2:49 PM Margot Ables wrote: Medication: clobetasol (TEMOVATE) 0.05 % external solution - pt is out of the solution and is not able to see Dermatology until March - requesting refill as it did offer some relief Has the patient contacted their pharmacy? No refills and was referred to dermatology not expecting it would take so long to get in Preferred Pharmacy (with phone number or street name): [facility] [place], [address] [phone] (Phone) [phone] (Fax)

## 2018-11-09 MED ORDER — CLOBETASOL PROPIONATE 0.05 % EX SOLN: 1.0000 "application " | Freq: Two times a day (BID) | CUTANEOUS | 0 refills | Status: DC

## 2018-11-09 NOTE — Telephone Encounter (Signed)
Call in a 50 ml tube

## 2018-11-09 NOTE — Telephone Encounter (Signed)
Refill has been sent to the pharmacy.  

## 2018-11-27 ENCOUNTER — Encounter: Payer: Medicare Other | Admitting: Psychology

## 2018-12-03 ENCOUNTER — Other Ambulatory Visit: Payer: Self-pay | Admitting: Family Medicine

## 2018-12-04 ENCOUNTER — Other Ambulatory Visit: Payer: Self-pay | Admitting: Family Medicine

## 2018-12-17 ENCOUNTER — Other Ambulatory Visit: Payer: Self-pay | Admitting: Family Medicine

## 2018-12-27 ENCOUNTER — Other Ambulatory Visit: Payer: Self-pay | Admitting: Family Medicine

## 2018-12-27 ENCOUNTER — Encounter: Payer: Self-pay | Admitting: Family Medicine

## 2018-12-27 NOTE — Telephone Encounter (Signed)
Dr. [name] please advise on any refills.  Thanks

## 2018-12-27 NOTE — Telephone Encounter (Signed)
No we cannot do that. There is no danger of pharmacies closing

## 2018-12-28 MED ORDER — FLUOXETINE HCL 40 MG PO CAPS: 40.0000 mg | ORAL_CAPSULE | Freq: Every day | ORAL | 1 refills | Status: DC

## 2018-12-28 MED ORDER — CLOBETASOL PROPIONATE 0.05 % EX SOLN: Freq: Two times a day (BID) | CUTANEOUS | 5 refills | Status: DC

## 2018-12-28 MED ORDER — AMLODIPINE BESYLATE 5 MG PO TABS: 5.0000 mg | ORAL_TABLET | Freq: Every day | ORAL | 5 refills | Status: DC

## 2018-12-28 MED ORDER — ATORVASTATIN CALCIUM 20 MG PO TABS: 20.0000 mg | ORAL_TABLET | Freq: Every day | ORAL | 5 refills | Status: DC

## 2019-01-06 ENCOUNTER — Other Ambulatory Visit: Payer: Self-pay | Admitting: Family Medicine

## 2019-01-28 ENCOUNTER — Encounter: Payer: Self-pay | Admitting: Family Medicine

## 2019-01-28 ENCOUNTER — Ambulatory Visit (INDEPENDENT_AMBULATORY_CARE_PROVIDER_SITE_OTHER): Payer: Medicare Other | Admitting: Family Medicine

## 2019-01-28 ENCOUNTER — Encounter: Payer: Self-pay | Admitting: *Deleted

## 2019-01-28 ENCOUNTER — Other Ambulatory Visit: Payer: Self-pay

## 2019-01-28 DIAGNOSIS — F411 Generalized anxiety disorder: Secondary | ICD-10-CM

## 2019-01-28 DIAGNOSIS — E119 Type 2 diabetes mellitus without complications: Secondary | ICD-10-CM | POA: Diagnosis not present

## 2019-01-28 DIAGNOSIS — N138 Other obstructive and reflux uropathy: Secondary | ICD-10-CM

## 2019-01-28 DIAGNOSIS — N401 Enlarged prostate with lower urinary tract symptoms: Secondary | ICD-10-CM

## 2019-01-28 DIAGNOSIS — I5022 Chronic systolic (congestive) heart failure: Secondary | ICD-10-CM | POA: Diagnosis not present

## 2019-01-28 DIAGNOSIS — I10 Essential (primary) hypertension: Secondary | ICD-10-CM | POA: Diagnosis not present

## 2019-01-28 DIAGNOSIS — L409 Psoriasis, unspecified: Secondary | ICD-10-CM | POA: Diagnosis not present

## 2019-01-28 MED ORDER — OLMESARTAN MEDOXOMIL 40 MG PO TABS: 40.0000 mg | ORAL_TABLET | Freq: Every day | ORAL | 3 refills | Status: DC

## 2019-01-28 MED ORDER — FUROSEMIDE 40 MG PO TABS: 40.0000 mg | ORAL_TABLET | Freq: Every day | ORAL | 3 refills | Status: DC

## 2019-01-28 MED ORDER — CLOBETASOL PROPIONATE 0.05 % EX SOLN: Freq: Two times a day (BID) | CUTANEOUS | 5 refills | Status: DC

## 2019-01-28 MED ORDER — ATORVASTATIN CALCIUM 20 MG PO TABS: 20.0000 mg | ORAL_TABLET | Freq: Every day | ORAL | 3 refills | Status: DC

## 2019-01-28 MED ORDER — METOPROLOL SUCCINATE ER 200 MG PO TB24: 200.0000 mg | ORAL_TABLET | Freq: Every day | ORAL | 3 refills | Status: DC

## 2019-01-28 MED ORDER — METFORMIN HCL 500 MG PO TABS: ORAL_TABLET | ORAL | 3 refills | Status: DC

## 2019-01-28 MED ORDER — FLUOXETINE HCL 40 MG PO CAPS: 40.0000 mg | ORAL_CAPSULE | Freq: Every day | ORAL | 3 refills | Status: DC

## 2019-01-28 MED ORDER — SPIRONOLACTONE 25 MG PO TABS: 25.0000 mg | ORAL_TABLET | Freq: Every day | ORAL | 3 refills | Status: DC

## 2019-01-28 MED ORDER — AMLODIPINE BESYLATE 5 MG PO TABS: 5.0000 mg | ORAL_TABLET | Freq: Every day | ORAL | 3 refills | Status: DC

## 2019-01-28 NOTE — Progress Notes (Signed)
Subjective:    Patient ID: [name], male    DOB: [birth year], 73 y.o.   MRN: [id number]  HPI Virtual Visit via Video Note  I connected with the patient on 01/28/19 at  2:00 PM EDT by a video enabled telemedicine application and verified that I am speaking with the correct person using two identifiers.  Location patient: home Location provider:work or home office Persons participating in the virtual visit: patient, provider  I discussed the limitations of evaluation and management by telemedicine and the availability of in person appointments. The patient expressed understanding and agreed to proceed.   HPI: Here to follow up on issues and to change pharmacies. He is switching to a pharmacy with a drive through window. He feels well. He has not checked his BP or his glucose in a very long time. He tries to go outside to walk every day.    ROS: See pertinent positives and negatives per HPI.  Past Medical History:  Diagnosis Date  . Chronic systolic CHF (congestive heart failure) ([place])    a. Echo (08/28/13): Mild LVH, EF 35-40%, diffuse HK, moderate to severe LAE.  . Diabetes mellitus without complication ([place])   . Diverticulitis   . Headache(784.0)   . Hyperlipidemia   . Hypertension   . Lymphoma, non Hodgkin's    sees Dr. [name]   . Morbid obesity ([place])   . NICM (nonischemic cardiomyopathy) ([place])    a. R/L Heart cath 08/30/13: RA mean 8, RV 30/0, PA 27/12, mean PCWP 9, CO 5.67, CI 1.98; normal coronary arteries  . Sleep apnea     Past Surgical History:  Procedure Laterality Date  . HERNIA REPAIR     umblical  . incarcerated hernia     vental 11/19/08 Dr. [name]  . LEFT AND RIGHT HEART CATHETERIZATION WITH CORONARY ANGIOGRAM N/A 08/30/2013   Procedure: LEFT AND RIGHT HEART CATHETERIZATION WITH CORONARY ANGIOGRAM;  Surgeon: [name], MD;  Location: [facility] CATH LAB;  Service: Cardiovascular;  Laterality: N/A;    Family History  Problem Relation Age of  Onset  . Heart failure Mother   . Hypertension Mother   . Cancer Father        colon  . Hypertension Father   . Diabetes Father   . Stroke Maternal Grandmother   . Diabetes Paternal Grandfather   . Cancer Maternal Grandfather      Current Outpatient Medications:  .  amLODipine (NORVASC) 5 MG tablet, Take 1 tablet (5 mg total) by mouth daily., Disp: 90 tablet, Rfl: 3 .  aspirin 81 MG tablet, Take 81 mg by mouth daily., Disp: , Rfl:  .  atorvastatin (LIPITOR) 20 MG tablet, Take 1 tablet (20 mg total) by mouth daily., Disp: 90 tablet, Rfl: 3 .  clobetasol (TEMOVATE) 0.05 % external solution, Apply topically 2 (two) times daily., Disp: 50 mL, Rfl: 5 .  Cyanocobalamin (VITAMIN B 12 PO), Take by mouth daily., Disp: , Rfl:  .  cyclobenzaprine (FLEXERIL) 10 MG tablet, Take 1 tablet (10 mg total) by mouth 3 (three) times daily as needed for muscle spasms., Disp: 60 tablet, Rfl: 2 .  FLUoxetine (PROZAC) 40 MG capsule, Take 1 capsule (40 mg total) by mouth daily., Disp: 90 capsule, Rfl: 3 .  furosemide (LASIX) 40 MG tablet, Take 1 tablet (40 mg total) by mouth daily., Disp: 90 tablet, Rfl: 3 .  meclizine (ANTIVERT) 25 MG tablet, Take 1 tablet (25 mg total) by mouth every 4 (four) hours  as needed for dizziness., Disp: 60 tablet, Rfl: 5 .  metFORMIN (GLUCOPHAGE) 500 MG tablet, TAKE 1 TABLET BY MOUTH TWICE DAILY WITH A MEAL, Disp: 180 tablet, Rfl: 3 .  metoprolol (TOPROL-XL) 200 MG 24 hr tablet, Take 1 tablet (200 mg total) by mouth daily., Disp: 90 tablet, Rfl: 3 .  olmesartan (BENICAR) 40 MG tablet, Take 1 tablet (40 mg total) by mouth daily., Disp: 90 tablet, Rfl: 3 .  Omega-3 Fatty Acids (OMEGA 3 PO), Take 2-3 capsules by mouth daily. , Disp: , Rfl:  .  polyethylene glycol (MIRALAX / GLYCOLAX) packet, Take 17 g by mouth daily as needed for moderate constipation., Disp: , Rfl:  .  spironolactone (ALDACTONE) 25 MG tablet, Take 1 tablet (25 mg total) by mouth daily., Disp: 90 tablet, Rfl: 3  EXAM:   VITALS per patient if applicable:  GENERAL: alert, oriented, appears well and in no acute distress  HEENT: atraumatic, conjunttiva clear, no obvious abnormalities on inspection of external nose and ears  NECK: normal movements of the head and neck  LUNGS: on inspection no signs of respiratory distress, breathing rate appears normal, no obvious gross SOB, gasping or wheezing  CV: no obvious cyanosis  MS: moves all visible extremities without noticeable abnormality  PSYCH/NEURO: pleasant and cooperative, no obvious depression or anxiety, speech and thought processing grossly intact  ASSESSMENT AND PLAN: For the HTN, I asked him to purchase a BP cuff and to check his BP once a day at home. For the diabetes, I asked him to check his glucose daily and to vary the time of day so he can track these more easily. He will come in tomorrow for fasting labs including A1c, lipids, and renal function. Alysia Penna, MD  Discussed the following assessment and plan:  Type 2 diabetes, HbA1c goal < 7% ([place]) - Plan: Lipid panel, Basic metabolic panel, Hepatic function panel, TSH, POCT Urinalysis Dipstick (Automated), CBC with Differential/Platelet, Hemoglobin A1c, CANCELED: Lipid panel, CANCELED: Basic metabolic panel, CANCELED: Hepatic function panel, CANCELED: TSH, CANCELED: POCT Urinalysis Dipstick (Automated), CANCELED: CBC with Differential/Platelet, CANCELED: Hemoglobin A1c  Psoriasis of scalp  Essential hypertension  Chronic systolic heart failure (HCC)  Anxiety, generalized  BPH with urinary obstruction - Plan: PSA, CANCELED: PSA     I discussed the assessment and treatment plan with the patient. The patient was provided an opportunity to ask questions and all were answered. The patient agreed with the plan and demonstrated an understanding of the instructions.   The patient was advised to call back or seek an in-person evaluation if the symptoms worsen or if the condition fails to improve  as anticipated.     Review of Systems     Objective:   Physical Exam        Assessment & Plan:

## 2019-01-29 ENCOUNTER — Telehealth: Payer: Self-pay | Admitting: *Deleted

## 2019-01-29 NOTE — Telephone Encounter (Signed)
Copied from [place] [id number]. Topic: General - Inquiry >> Jan 29, 2019  7:48 AM Scherrie Gerlach wrote: Reason for CRM: wife called to let inform Dr [name] pt would not be coming in for the lab appt today.  Pt was not on the schedule and she says Dr told him to just come in, but he is not going to   I have called the pt and left a VM to have them call back to see when he would be coming for the lab appt.

## 2019-02-18 ENCOUNTER — Other Ambulatory Visit: Payer: Self-pay

## 2019-02-18 ENCOUNTER — Ambulatory Visit (INDEPENDENT_AMBULATORY_CARE_PROVIDER_SITE_OTHER): Payer: Medicare Other | Admitting: Family Medicine

## 2019-02-18 ENCOUNTER — Encounter: Payer: Self-pay | Admitting: Family Medicine

## 2019-02-18 DIAGNOSIS — N200 Calculus of kidney: Secondary | ICD-10-CM | POA: Diagnosis not present

## 2019-02-18 NOTE — Progress Notes (Signed)
Subjective:    Patient ID: [name], male    DOB: 01-Oct-1946, 73 y.o.   MRN: [id number]  HPI Virtual Visit via Video Note  I connected with the patient on 02/18/19 at 10:45 AM EDT by a video enabled telemedicine application and verified that I am speaking with the correct person using two identifiers.  Location patient: home Location provider:work or home office Persons participating in the virtual visit: patient, provider  I discussed the limitations of evaluation and management by telemedicine and the availability of in person appointments. The patient expressed understanding and agreed to proceed.   HPI: Here to discuss some flank pain and blood in the urine over the weekend. He passed a kidney stone once before in 2014. 2 days ago he noticed a sharp pain in the left flank just above the pelvic bone that steadily got worse over 12 hours. At this same time he began to have some urgency and some blood appeared in the urine. No fever or back pain or nausea. Then last night the flank pain abruptly stopped and the blood disappeared from the urine. This morning he feels fine, no urgency or any other symptoms.    ROS: See pertinent positives and negatives per HPI.  Past Medical History:  Diagnosis Date  . Chronic systolic CHF (congestive heart failure) ([place])    a. Echo (08/28/13): Mild LVH, EF 35-40%, diffuse HK, moderate to severe LAE.  . Diabetes mellitus without complication ([place])   . Diverticulitis   . Headache(784.0)   . Hyperlipidemia   . Hypertension   . Lymphoma, non Hodgkin's    sees Dr. [name]   . Morbid obesity ([place])   . NICM (nonischemic cardiomyopathy) ([place])    a. R/L Heart cath 08/30/13: RA mean 8, RV 30/0, PA 27/12, mean PCWP 9, CO 5.67, CI 1.98; normal coronary arteries  . Sleep apnea     Past Surgical History:  Procedure Laterality Date  . HERNIA REPAIR     umblical  . incarcerated hernia     vental 11/19/08 Dr. [name]  . LEFT AND RIGHT HEART  CATHETERIZATION WITH CORONARY ANGIOGRAM N/A 08/30/2013   Procedure: LEFT AND RIGHT HEART CATHETERIZATION WITH CORONARY ANGIOGRAM;  Surgeon: [name], MD;  Location: [facility] CATH LAB;  Service: Cardiovascular;  Laterality: N/A;    Family History  Problem Relation Age of Onset  . Heart failure Mother   . Hypertension Mother   . Cancer Father        colon  . Hypertension Father   . Diabetes Father   . Stroke Maternal Grandmother   . Diabetes Paternal Grandfather   . Cancer Maternal Grandfather      Current Outpatient Medications:  .  amLODipine (NORVASC) 5 MG tablet, Take 1 tablet (5 mg total) by mouth daily., Disp: 90 tablet, Rfl: 3 .  aspirin 81 MG tablet, Take 81 mg by mouth daily., Disp: , Rfl:  .  atorvastatin (LIPITOR) 20 MG tablet, Take 1 tablet (20 mg total) by mouth daily., Disp: 90 tablet, Rfl: 3 .  clobetasol (TEMOVATE) 0.05 % external solution, Apply topically 2 (two) times daily., Disp: 50 mL, Rfl: 5 .  Cyanocobalamin (VITAMIN B 12 PO), Take by mouth daily., Disp: , Rfl:  .  cyclobenzaprine (FLEXERIL) 10 MG tablet, Take 1 tablet (10 mg total) by mouth 3 (three) times daily as needed for muscle spasms., Disp: 60 tablet, Rfl: 2 .  FLUoxetine (PROZAC) 40 MG capsule, Take 1 capsule (40 mg  total) by mouth daily., Disp: 90 capsule, Rfl: 3 .  furosemide (LASIX) 40 MG tablet, Take 1 tablet (40 mg total) by mouth daily., Disp: 90 tablet, Rfl: 3 .  meclizine (ANTIVERT) 25 MG tablet, Take 1 tablet (25 mg total) by mouth every 4 (four) hours as needed for dizziness., Disp: 60 tablet, Rfl: 5 .  metFORMIN (GLUCOPHAGE) 500 MG tablet, TAKE 1 TABLET BY MOUTH TWICE DAILY WITH A MEAL, Disp: 180 tablet, Rfl: 3 .  metoprolol (TOPROL-XL) 200 MG 24 hr tablet, Take 1 tablet (200 mg total) by mouth daily., Disp: 90 tablet, Rfl: 3 .  olmesartan (BENICAR) 40 MG tablet, Take 1 tablet (40 mg total) by mouth daily., Disp: 90 tablet, Rfl: 3 .  Omega-3 Fatty Acids (OMEGA 3 PO), Take 2-3 capsules by mouth  daily. , Disp: , Rfl:  .  polyethylene glycol (MIRALAX / GLYCOLAX) packet, Take 17 g by mouth daily as needed for moderate constipation., Disp: , Rfl:  .  spironolactone (ALDACTONE) 25 MG tablet, Take 1 tablet (25 mg total) by mouth daily., Disp: 90 tablet, Rfl: 3  EXAM:  VITALS per patient if applicable:  GENERAL: alert, oriented, appears well and in no acute distress  HEENT: atraumatic, conjunttiva clear, no obvious abnormalities on inspection of external nose and ears  NECK: normal movements of the head and neck  LUNGS: on inspection no signs of respiratory distress, breathing rate appears normal, no obvious gross SOB, gasping or wheezing  CV: no obvious cyanosis  MS: moves all visible extremities without noticeable abnormality  PSYCH/NEURO: pleasant and cooperative, no obvious depression or anxiety, speech and thought processing grossly intact  ASSESSMENT AND PLAN: He likely has passed another kidney stone. Currently he has no symptoms to indicate a UTI. I advised him to drink lots of water and to call us back if any of these symptoms return. He is planning to come by our lab this week for blood work, and we will check a UA at that time as well.  Alysia Penna, MD  Discussed the following assessment and plan:  No diagnosis found.     I discussed the assessment and treatment plan with the patient. The patient was provided an opportunity to ask questions and all were answered. The patient agreed with the plan and demonstrated an understanding of the instructions.   The patient was advised to call back or seek an in-person evaluation if the symptoms worsen or if the condition fails to improve as anticipated.     Review of Systems     Objective:   Physical Exam        Assessment & Plan:

## 2019-04-01 ENCOUNTER — Encounter: Payer: Self-pay | Admitting: Family Medicine

## 2019-04-02 ENCOUNTER — Other Ambulatory Visit: Payer: Medicare Other

## 2019-04-02 NOTE — Telephone Encounter (Signed)
Dr. Fry please advise. Thanks  

## 2019-04-03 ENCOUNTER — Other Ambulatory Visit: Payer: Self-pay

## 2019-04-03 ENCOUNTER — Ambulatory Visit (INDEPENDENT_AMBULATORY_CARE_PROVIDER_SITE_OTHER): Payer: Medicare Other | Admitting: Family Medicine

## 2019-04-03 ENCOUNTER — Encounter: Payer: Self-pay | Admitting: Family Medicine

## 2019-04-03 ENCOUNTER — Other Ambulatory Visit: Payer: Self-pay | Admitting: Family Medicine

## 2019-04-03 ENCOUNTER — Ambulatory Visit (INDEPENDENT_AMBULATORY_CARE_PROVIDER_SITE_OTHER): Payer: Medicare Other

## 2019-04-03 VITALS — BP 118/62 | HR 51 | Temp 96.7°F | Wt 373.2 lb

## 2019-04-03 DIAGNOSIS — R413 Other amnesia: Secondary | ICD-10-CM

## 2019-04-03 DIAGNOSIS — I10 Essential (primary) hypertension: Secondary | ICD-10-CM | POA: Diagnosis not present

## 2019-04-03 DIAGNOSIS — R0602 Shortness of breath: Secondary | ICD-10-CM | POA: Diagnosis not present

## 2019-04-03 DIAGNOSIS — R609 Edema, unspecified: Secondary | ICD-10-CM | POA: Diagnosis not present

## 2019-04-03 DIAGNOSIS — I5022 Chronic systolic (congestive) heart failure: Secondary | ICD-10-CM | POA: Diagnosis not present

## 2019-04-03 DIAGNOSIS — E119 Type 2 diabetes mellitus without complications: Secondary | ICD-10-CM | POA: Diagnosis not present

## 2019-04-03 DIAGNOSIS — G4733 Obstructive sleep apnea (adult) (pediatric): Secondary | ICD-10-CM | POA: Diagnosis not present

## 2019-04-03 DIAGNOSIS — R410 Disorientation, unspecified: Secondary | ICD-10-CM

## 2019-04-03 LAB — AMMONIA: Ammonia: 47 umol/L — ABNORMAL HIGH (ref 11–35)

## 2019-04-03 LAB — BRAIN NATRIURETIC PEPTIDE: Pro B Natriuretic peptide (BNP): 383 pg/mL — ABNORMAL HIGH (ref 0.0–100.0)

## 2019-04-03 MED ORDER — DIAZEPAM 5 MG PO TABS: 5.0000 mg | ORAL_TABLET | Freq: Two times a day (BID) | ORAL | 0 refills | Status: DC | PRN

## 2019-04-03 NOTE — Progress Notes (Signed)
Subjective:    Patient ID: [name], male    DOB: October 15, 1946, 73 y.o.   MRN: [id number]  HPI Here with his daughter Mickel Baas for a number of issues. First we have been discussing his memory loss for several years, and last July I referred him to Neurology to be evaluated. However the first neurologist he was scheduled to see left the practice and he never got set up with anyone else. Lately the memory loss has gotten a bit worse, and in the past 2 weeks he has been hallucinating a lot. Sometimes he is aware of this and other times he is not. For instance he says he has been taking to his mother, who passed away some years ago. He denies any mood issues like anxiety or depression, and his daughter concurs. His daughter is concerned he may have an acute problem that is causing this, like a UTI for instance. His medications have not been changed for some time. He has no change in bowel or bladder habits. No fevers. No chest pain. No headaches. He does admit to being more SOB than usual for several weeks, and his daughter notes that he has to stop to catch his breath if he is talking. No ankle swelling. He has lost 9 lbs in the last 8 months.    Review of Systems  Constitutional: Negative.   HENT: Negative.   Eyes: Negative.   Respiratory: Positive for shortness of breath. Negative for cough, choking, chest tightness and wheezing.   Cardiovascular: Negative.   Gastrointestinal: Negative.   Genitourinary: Positive for frequency. Negative for dysuria, flank pain, hematuria, scrotal swelling, testicular pain and urgency.  Neurological: Negative.   Hematological: Negative for adenopathy. Does not bruise/bleed easily.       Objective:   Physical Exam Constitutional:      General: He is not in acute distress.    Comments: Morbidly obese, he does get SOB with prolonged speaking  Neck:     Musculoskeletal: Normal range of motion and neck supple. No neck rigidity or muscular tenderness.   Cardiovascular:     Rate and Rhythm: Normal rate and regular rhythm.     Pulses: Normal pulses.     Heart sounds: Normal heart sounds.  Pulmonary:     Effort: Pulmonary effort is normal. No respiratory distress.     Breath sounds: Normal breath sounds. No stridor. No wheezing, rhonchi or rales.  Musculoskeletal:     Right lower leg: No edema.     Left lower leg: No edema.  Lymphadenopathy:     Cervical: No cervical adenopathy.  Neurological:     General: No focal deficit present.     Mental Status: He is alert and oriented to person, place, and time.           Assessment & Plan:  He is having worsening memory and cognition issues and now he is having auditory and visual hallucinations. These could be due to structural problems in the brain, hypoxemia, infection, etc. They could also be due to worsening dementia, but he does not seem to be having a mood disorder currently. We will get labs today including an ammonia level and we will set up a brain MRI soon (the last one was in 2010). We will try to have Neurology see him asap. We will rule out a UTI etc. As for SOB, his obesity is certainly causing a hypoventilation syndrome. He does not show signs of a fluid overload. We will get  a CXR today.  Alysia Penna, MD

## 2019-04-03 NOTE — Telephone Encounter (Signed)
He was seen today for this

## 2019-04-05 ENCOUNTER — Encounter: Payer: Self-pay | Admitting: Family Medicine

## 2019-04-05 ENCOUNTER — Telehealth: Payer: Self-pay | Admitting: Family Medicine

## 2019-04-05 NOTE — Telephone Encounter (Signed)
[name], daughter calling back to discuss lab results and to possibly get a UA ordered for the pt. She states his confusion is not any better.  She is thinking could possibly be UTI and she does not want to wait until Monday. Please call back thanks

## 2019-04-05 NOTE — Telephone Encounter (Signed)
Called and spoke with [facility] and she is aware of results.

## 2019-04-06 DIAGNOSIS — F05 Delirium due to known physiological condition: Secondary | ICD-10-CM | POA: Diagnosis not present

## 2019-04-06 DIAGNOSIS — Z20828 Contact with and (suspected) exposure to other viral communicable diseases: Secondary | ICD-10-CM | POA: Diagnosis not present

## 2019-04-07 ENCOUNTER — Other Ambulatory Visit: Payer: Self-pay

## 2019-04-07 ENCOUNTER — Encounter (HOSPITAL_COMMUNITY): Payer: Self-pay | Admitting: *Deleted

## 2019-04-07 ENCOUNTER — Inpatient Hospital Stay (HOSPITAL_COMMUNITY)
Admission: EM | Admit: 2019-04-07 | Discharge: 2019-04-08 | DRG: 071 | Disposition: A | Discharge status: Home / Self Care | Payer: Medicare Other | Attending: Internal Medicine | Admitting: Internal Medicine

## 2019-04-07 ENCOUNTER — Emergency Department (HOSPITAL_COMMUNITY): Payer: Medicare Other

## 2019-04-07 DIAGNOSIS — I4891 Unspecified atrial fibrillation: Secondary | ICD-10-CM | POA: Diagnosis not present

## 2019-04-07 DIAGNOSIS — Z1159 Encounter for screening for other viral diseases: Secondary | ICD-10-CM

## 2019-04-07 DIAGNOSIS — Z8572 Personal history of non-Hodgkin lymphomas: Secondary | ICD-10-CM

## 2019-04-07 DIAGNOSIS — Z6841 Body Mass Index (BMI) 40.0 and over, adult: Secondary | ICD-10-CM | POA: Diagnosis not present

## 2019-04-07 DIAGNOSIS — Z809 Family history of malignant neoplasm, unspecified: Secondary | ICD-10-CM | POA: Diagnosis not present

## 2019-04-07 DIAGNOSIS — Z833 Family history of diabetes mellitus: Secondary | ICD-10-CM | POA: Diagnosis not present

## 2019-04-07 DIAGNOSIS — I10 Essential (primary) hypertension: Secondary | ICD-10-CM | POA: Diagnosis present

## 2019-04-07 DIAGNOSIS — I428 Other cardiomyopathies: Secondary | ICD-10-CM | POA: Diagnosis present

## 2019-04-07 DIAGNOSIS — R4182 Altered mental status, unspecified: Secondary | ICD-10-CM | POA: Diagnosis not present

## 2019-04-07 DIAGNOSIS — Z20828 Contact with and (suspected) exposure to other viral communicable diseases: Secondary | ICD-10-CM | POA: Diagnosis not present

## 2019-04-07 DIAGNOSIS — Z823 Family history of stroke: Secondary | ICD-10-CM | POA: Diagnosis not present

## 2019-04-07 DIAGNOSIS — Z7982 Long term (current) use of aspirin: Secondary | ICD-10-CM

## 2019-04-07 DIAGNOSIS — R443 Hallucinations, unspecified: Secondary | ICD-10-CM | POA: Diagnosis not present

## 2019-04-07 DIAGNOSIS — E119 Type 2 diabetes mellitus without complications: Secondary | ICD-10-CM | POA: Diagnosis not present

## 2019-04-07 DIAGNOSIS — R410 Disorientation, unspecified: Secondary | ICD-10-CM | POA: Diagnosis not present

## 2019-04-07 DIAGNOSIS — R442 Other hallucinations: Secondary | ICD-10-CM | POA: Diagnosis not present

## 2019-04-07 DIAGNOSIS — R404 Transient alteration of awareness: Secondary | ICD-10-CM | POA: Diagnosis not present

## 2019-04-07 DIAGNOSIS — E785 Hyperlipidemia, unspecified: Secondary | ICD-10-CM | POA: Diagnosis present

## 2019-04-07 DIAGNOSIS — F329 Major depressive disorder, single episode, unspecified: Secondary | ICD-10-CM | POA: Diagnosis present

## 2019-04-07 DIAGNOSIS — Z79899 Other long term (current) drug therapy: Secondary | ICD-10-CM

## 2019-04-07 DIAGNOSIS — F0390 Unspecified dementia without behavioral disturbance: Secondary | ICD-10-CM | POA: Diagnosis present

## 2019-04-07 DIAGNOSIS — I110 Hypertensive heart disease with heart failure: Secondary | ICD-10-CM | POA: Diagnosis present

## 2019-04-07 DIAGNOSIS — E1169 Type 2 diabetes mellitus with other specified complication: Secondary | ICD-10-CM

## 2019-04-07 DIAGNOSIS — I491 Atrial premature depolarization: Secondary | ICD-10-CM | POA: Diagnosis not present

## 2019-04-07 DIAGNOSIS — F05 Delirium due to known physiological condition: Secondary | ICD-10-CM | POA: Diagnosis present

## 2019-04-07 DIAGNOSIS — Z7984 Long term (current) use of oral hypoglycemic drugs: Secondary | ICD-10-CM | POA: Diagnosis not present

## 2019-04-07 DIAGNOSIS — E876 Hypokalemia: Secondary | ICD-10-CM | POA: Diagnosis not present

## 2019-04-07 DIAGNOSIS — I5022 Chronic systolic (congestive) heart failure: Secondary | ICD-10-CM | POA: Diagnosis not present

## 2019-04-07 DIAGNOSIS — R0602 Shortness of breath: Secondary | ICD-10-CM | POA: Diagnosis not present

## 2019-04-07 DIAGNOSIS — Z8249 Family history of ischemic heart disease and other diseases of the circulatory system: Secondary | ICD-10-CM

## 2019-04-07 DIAGNOSIS — G9341 Metabolic encephalopathy: Principal | ICD-10-CM | POA: Diagnosis present

## 2019-04-07 DIAGNOSIS — G4733 Obstructive sleep apnea (adult) (pediatric): Secondary | ICD-10-CM | POA: Diagnosis not present

## 2019-04-07 DIAGNOSIS — I480 Paroxysmal atrial fibrillation: Secondary | ICD-10-CM | POA: Diagnosis not present

## 2019-04-07 DIAGNOSIS — I5032 Chronic diastolic (congestive) heart failure: Secondary | ICD-10-CM

## 2019-04-07 LAB — COMPREHENSIVE METABOLIC PANEL
ALT: 26 U/L (ref 0–44)
AST: 21 U/L (ref 15–41)
Albumin: 3.9 g/dL (ref 3.5–5.0)
Alkaline Phosphatase: 72 U/L (ref 38–126)
Anion gap: 14 (ref 5–15)
BUN: 22 mg/dL (ref 8–23)
CO2: 28 mmol/L (ref 22–32)
Calcium: 8.8 mg/dL — ABNORMAL LOW (ref 8.9–10.3)
Chloride: 99 mmol/L (ref 98–111)
Creatinine, Ser: 0.81 mg/dL (ref 0.61–1.24)
GFR calc Af Amer: >60 mL/min (ref >60)
GFR calc non Af Amer: >60 mL/min (ref >60)
Glucose, Bld: 163 mg/dL — ABNORMAL HIGH (ref 70–99)
Potassium: 3.2 mmol/L — ABNORMAL LOW (ref 3.5–5.1)
Sodium: 141 mmol/L (ref 135–145)
Total Bilirubin: 0.4 mg/dL (ref 0.3–1.2)
Total Protein: 7.1 g/dL (ref 6.5–8.1)

## 2019-04-07 LAB — BASIC METABOLIC PANEL
Anion gap: 12 (ref 5–15)
BUN: 21 mg/dL (ref 8–23)
CO2: 29 mmol/L (ref 22–32)
Calcium: 8.9 mg/dL (ref 8.9–10.3)
Chloride: 99 mmol/L (ref 98–111)
Creatinine, Ser: 0.73 mg/dL (ref 0.61–1.24)
GFR calc Af Amer: >60 mL/min (ref >60)
GFR calc non Af Amer: >60 mL/min (ref >60)
Glucose, Bld: 128 mg/dL — ABNORMAL HIGH (ref 70–99)
Potassium: 3.6 mmol/L (ref 3.5–5.1)
Sodium: 140 mmol/L (ref 135–145)

## 2019-04-07 LAB — CBC
HCT: 51.5 % (ref 39.0–52.0)
Hemoglobin: 15.8 g/dL (ref 13.0–17.0)
MCH: 27.7 pg (ref 26.0–34.0)
MCHC: 30.7 g/dL (ref 30.0–36.0)
MCV: 90.2 fL (ref 80.0–100.0)
Platelets: 282 10*3/uL (ref 150–400)
RBC: 5.71 MIL/uL (ref 4.22–5.81)
RDW: 13.7 % (ref 11.5–15.5)
WBC: 10.6 10*3/uL — ABNORMAL HIGH (ref 4.0–10.5)
nRBC: 0 % (ref 0.0–0.2)

## 2019-04-07 LAB — CBC WITH DIFFERENTIAL/PLATELET
Abs Immature Granulocytes: 0.03 10*3/uL (ref 0.00–0.07)
Basophils Absolute: 0 10*3/uL (ref 0.0–0.1)
Basophils Relative: 0 %
Eosinophils Absolute: 0.3 10*3/uL (ref 0.0–0.5)
Eosinophils Relative: 3 %
HCT: 51.5 % (ref 39.0–52.0)
Hemoglobin: 16.1 g/dL (ref 13.0–17.0)
Immature Granulocytes: 0 %
Lymphocytes Relative: 16 %
Lymphs Abs: 1.5 10*3/uL (ref 0.7–4.0)
MCH: 28.3 pg (ref 26.0–34.0)
MCHC: 31.3 g/dL (ref 30.0–36.0)
MCV: 90.5 fL (ref 80.0–100.0)
Monocytes Absolute: 0.6 10*3/uL (ref 0.1–1.0)
Monocytes Relative: 7 %
Neutro Abs: 7 10*3/uL (ref 1.7–7.7)
Neutrophils Relative %: 74 %
Platelets: 245 10*3/uL (ref 150–400)
RBC: 5.69 MIL/uL (ref 4.22–5.81)
RDW: 13.9 % (ref 11.5–15.5)
WBC: 9.4 10*3/uL (ref 4.0–10.5)
nRBC: 0 % (ref 0.0–0.2)

## 2019-04-07 LAB — RAPID URINE DRUG SCREEN, HOSP PERFORMED
Amphetamines: NOT DETECTED
Barbiturates: NOT DETECTED
Benzodiazepines: NOT DETECTED
Cocaine: NOT DETECTED
Opiates: NOT DETECTED
Tetrahydrocannabinol: NOT DETECTED

## 2019-04-07 LAB — URINALYSIS, ROUTINE W REFLEX MICROSCOPIC
Bacteria, UA: NONE SEEN
Bilirubin Urine: NEGATIVE
Glucose, UA: NEGATIVE mg/dL
Hgb urine dipstick: NEGATIVE
Ketones, ur: NEGATIVE mg/dL
Leukocytes,Ua: NEGATIVE
Nitrite: NEGATIVE
Protein, ur: 30 mg/dL — AB
Specific Gravity, Urine: 1.021 (ref 1.005–1.030)
pH: 5 (ref 5.0–8.0)

## 2019-04-07 LAB — LACTIC ACID, PLASMA: Lactic Acid, Venous: 1.4 mmol/L (ref 0.5–1.9)

## 2019-04-07 LAB — TSH: TSH: 2.079 u[IU]/mL (ref 0.350–4.500)

## 2019-04-07 LAB — MAGNESIUM: Magnesium: 2 mg/dL (ref 1.7–2.4)

## 2019-04-07 LAB — ETHANOL: Alcohol, Ethyl (B): <10 mg/dL (ref <10)

## 2019-04-07 LAB — TROPONIN I: Troponin I: <0.03 ng/mL (ref <0.03)

## 2019-04-07 LAB — GLUCOSE, CAPILLARY
Glucose-Capillary: 110 mg/dL — ABNORMAL HIGH (ref 70–99)
Glucose-Capillary: 122 mg/dL — ABNORMAL HIGH (ref 70–99)
Glucose-Capillary: 126 mg/dL — ABNORMAL HIGH (ref 70–99)
Glucose-Capillary: 134 mg/dL — ABNORMAL HIGH (ref 70–99)

## 2019-04-07 LAB — SARS CORONAVIRUS 2 BY RT PCR (HOSPITAL ORDER, PERFORMED IN ~~LOC~~ HOSPITAL LAB): SARS Coronavirus 2: NEGATIVE

## 2019-04-07 LAB — BRAIN NATRIURETIC PEPTIDE: B Natriuretic Peptide: 599.2 pg/mL — ABNORMAL HIGH (ref 0.0–100.0)

## 2019-04-07 LAB — AMMONIA: Ammonia: 40 umol/L — ABNORMAL HIGH (ref 9–35)

## 2019-04-07 LAB — PHOSPHORUS: Phosphorus: 3.4 mg/dL (ref 2.5–4.6)

## 2019-04-07 MED ORDER — SODIUM CHLORIDE 0.9 % IV SOLN
INTRAVENOUS | Status: DC
  Administered 2019-04-07: 06:00:00 via INTRAVENOUS

## 2019-04-07 MED ORDER — ONDANSETRON HCL 4 MG/2ML IJ SOLN: 4.0000 mg | Freq: Four times a day (QID) | INTRAMUSCULAR | Status: DC | PRN

## 2019-04-07 MED ORDER — INSULIN ASPART 100 UNIT/ML ~~LOC~~ SOLN
0.0000 [IU] | Freq: Three times a day (TID) | SUBCUTANEOUS | Status: DC
  Administered 2019-04-07 (×2): 2 [IU] via SUBCUTANEOUS

## 2019-04-07 MED ORDER — ATORVASTATIN CALCIUM 20 MG PO TABS
20.0000 mg | ORAL_TABLET | Freq: Every day | ORAL | Status: DC
  Administered 2019-04-07 – 2019-04-08 (×2): 20 mg via ORAL
  Filled 2019-04-07 (×2): qty 1

## 2019-04-07 MED ORDER — MECLIZINE HCL 25 MG PO TABS: 25.0000 mg | ORAL_TABLET | ORAL | Status: DC | PRN

## 2019-04-07 MED ORDER — ENOXAPARIN SODIUM 40 MG/0.4ML ~~LOC~~ SOLN: 40.0000 mg | Freq: Two times a day (BID) | SUBCUTANEOUS | Status: DC

## 2019-04-07 MED ORDER — ONDANSETRON HCL 4 MG PO TABS
4.0000 mg | ORAL_TABLET | Freq: Four times a day (QID) | ORAL | Status: DC | PRN
  Administered 2019-04-07: 4 mg via ORAL
  Filled 2019-04-07: qty 1

## 2019-04-07 MED ORDER — IRBESARTAN 300 MG PO TABS
300.0000 mg | ORAL_TABLET | Freq: Every day | ORAL | Status: DC
  Administered 2019-04-07 – 2019-04-08 (×2): 300 mg via ORAL
  Filled 2019-04-07 (×2): qty 1

## 2019-04-07 MED ORDER — ASPIRIN EC 81 MG PO TBEC
81.0000 mg | DELAYED_RELEASE_TABLET | Freq: Every day | ORAL | Status: DC
  Administered 2019-04-07: 81 mg via ORAL
  Filled 2019-04-07: qty 1

## 2019-04-07 MED ORDER — ACETAMINOPHEN 325 MG PO TABS: 650.0000 mg | ORAL_TABLET | Freq: Four times a day (QID) | ORAL | Status: DC | PRN

## 2019-04-07 MED ORDER — FLUOXETINE HCL 20 MG PO CAPS
40.0000 mg | ORAL_CAPSULE | Freq: Every day | ORAL | Status: DC
  Administered 2019-04-07 – 2019-04-08 (×2): 40 mg via ORAL
  Filled 2019-04-07 (×2): qty 2

## 2019-04-07 MED ORDER — METOPROLOL SUCCINATE ER 100 MG PO TB24
200.0000 mg | ORAL_TABLET | Freq: Every day | ORAL | Status: DC
  Administered 2019-04-07 – 2019-04-08 (×2): 200 mg via ORAL
  Filled 2019-04-07 (×2): qty 2

## 2019-04-07 MED ORDER — INSULIN ASPART 100 UNIT/ML ~~LOC~~ SOLN: 0.0000 [IU] | Freq: Every day | SUBCUTANEOUS | Status: DC

## 2019-04-07 MED ORDER — CLOBETASOL PROPIONATE 0.05 % EX CREA
TOPICAL_CREAM | Freq: Two times a day (BID) | CUTANEOUS | Status: DC
  Administered 2019-04-07 – 2019-04-08 (×3): via TOPICAL
  Filled 2019-04-07: qty 15

## 2019-04-07 MED ORDER — APIXABAN 5 MG PO TABS
5.0000 mg | ORAL_TABLET | Freq: Two times a day (BID) | ORAL | Status: DC
  Administered 2019-04-07 – 2019-04-08 (×2): 5 mg via ORAL
  Filled 2019-04-07 (×2): qty 1

## 2019-04-07 MED ORDER — POTASSIUM CHLORIDE CRYS ER 20 MEQ PO TBCR
40.0000 meq | EXTENDED_RELEASE_TABLET | Freq: Once | ORAL | Status: AC
  Administered 2019-04-07: 40 meq via ORAL
  Filled 2019-04-07: qty 2

## 2019-04-07 MED ORDER — APIXABAN 5 MG PO TABS
5.0000 mg | ORAL_TABLET | Freq: Once | ORAL | Status: AC
  Administered 2019-04-07: 5 mg via ORAL
  Filled 2019-04-07: qty 1

## 2019-04-07 MED ORDER — ENOXAPARIN SODIUM 40 MG/0.4ML ~~LOC~~ SOLN
40.0000 mg | SUBCUTANEOUS | Status: DC
  Administered 2019-04-07: 40 mg via SUBCUTANEOUS
  Filled 2019-04-07: qty 0.4

## 2019-04-07 MED ORDER — IPRATROPIUM-ALBUTEROL 0.5-2.5 (3) MG/3ML IN SOLN
3.0000 mL | Freq: Four times a day (QID) | RESPIRATORY_TRACT | Status: DC
  Filled 2019-04-07: qty 3

## 2019-04-07 MED ORDER — IPRATROPIUM-ALBUTEROL 0.5-2.5 (3) MG/3ML IN SOLN
3.0000 mL | Freq: Two times a day (BID) | RESPIRATORY_TRACT | Status: DC
  Administered 2019-04-07: 21:00:00 3 mL via RESPIRATORY_TRACT
  Filled 2019-04-07: qty 3

## 2019-04-07 MED ORDER — ALBUTEROL SULFATE (2.5 MG/3ML) 0.083% IN NEBU: 2.5000 mg | INHALATION_SOLUTION | RESPIRATORY_TRACT | Status: DC | PRN

## 2019-04-07 MED ORDER — ACETAMINOPHEN 650 MG RE SUPP: 650.0000 mg | Freq: Four times a day (QID) | RECTAL | Status: DC | PRN

## 2019-04-07 MED ORDER — SENNOSIDES-DOCUSATE SODIUM 8.6-50 MG PO TABS: 1.0000 | ORAL_TABLET | Freq: Every evening | ORAL | Status: DC | PRN

## 2019-04-07 MED ORDER — IPRATROPIUM BROMIDE 0.02 % IN SOLN: 0.5000 mg | Freq: Four times a day (QID) | RESPIRATORY_TRACT | Status: DC | PRN

## 2019-04-07 MED ORDER — SPIRONOLACTONE 25 MG PO TABS
25.0000 mg | ORAL_TABLET | Freq: Every day | ORAL | Status: DC
  Administered 2019-04-07 – 2019-04-08 (×2): 25 mg via ORAL
  Filled 2019-04-07 (×2): qty 1

## 2019-04-07 MED ORDER — BISACODYL 5 MG PO TBEC: 5.0000 mg | DELAYED_RELEASE_TABLET | Freq: Every day | ORAL | Status: DC | PRN

## 2019-04-07 MED ORDER — MAGNESIUM CITRATE PO SOLN: 1.0000 | Freq: Once | ORAL | Status: DC | PRN

## 2019-04-07 MED ORDER — FUROSEMIDE 40 MG PO TABS
40.0000 mg | ORAL_TABLET | Freq: Every day | ORAL | Status: DC
  Administered 2019-04-07 – 2019-04-08 (×2): 40 mg via ORAL
  Filled 2019-04-07 (×2): qty 1

## 2019-04-07 NOTE — Progress Notes (Addendum)
PROGRESS NOTE    [name]  GYI:[id number] DOB: 04-04-1946 DOA: 04/07/2019 PCP: [name], MD    Brief Narrative:  73 year old male who presented with dyspnea and altered mentation.  Patient does have the significant past medical history for heart failure, type 2 diabetes mellitus, hypertension and dyslipidemia.  Reported several weeks of progressive and worsening confusion, memory loss and hallucinations.  On his initial physical examination his blood pressure was 149/73, heart rate 66, respiratory rate 14, temperature 97.9, oxygen saturation 98%.  His lungs are clear to auscultation bilaterally, heart S1-S2 present and rhythmic, the abdomen was soft nontender, patient was awake and alert, following commands, his short-term memory was noted to be impaired.  Sodium 140, potassium 3.2, chloride 99, bicarb 28, glucose 163, BUN 22, creatinine 0.81, AST 21, ALT 26, ammonia 40, white count 9.4, hemoglobin 16.1, hematocrit 51.5, platelets 245.  SARS COVID-19 was negative.  Urinalysis was negative for infection.  Alcohol level less than 10, tox urine screen negative.  Head CT with no acute changes.  Chest radiograph with cardiomegaly, chronic vascular hilar congestion.  EKG 59 bpm, left axis with left anterior fascicular block, prolonged QRS, atrial fibrillation, indeterminate interventricular conduction delay, positive PVC, no ST segment or T wave changes.  Patient was admitted to the hospital working diagnosis of acute worsening metabolic encephalopathy  Assessment & Plan:   Active Problems:   Altered mental status   1. Acute confusional state. Patient this am is non focal, is orientated times place, time, person and situation. Will follow on brain MRI, continue neuro checks per unit protocol, hold on all psychotropic medications. Follow with physical therapy evaluation.   2. New onset of atrial fibrillation. Rate is controlled with metoprolol, patient has elevated risk for thrombosis,  CAHDS vasc of 4, will add apixaban for anticoagulation, dc aspirin. Continue telemetry monitoring and will check echocardiogram.   3. T2DM. Continue glucose cover and monitoring with insulin sliding scale, patient is tolerating po well.  4. Systolic heart failure. Stable with no clinical signs of exacerbation, continue metoprolol, irbesartan, spironolactone, and furosemide. Will hold on IV fluids for now.   5. Depression. Continue fluoxetine, will hold on diazepam.   6. Obesity/ morbid. Calculated bmi is 45.1  7. Dyslipidemia. Continue statin therapy.   8. Hypokalemia. K correction with Kcl, K this am is 3,6 and preserved renal function with serum cr at 0.79.    DVT prophylaxis: apixaban   Code Status: full Family Communication: no family at the bedside  Disposition Plan/ discharge barriers: pending brain MRI.   Body mass index is 45.19 kg/m. Malnutrition Type:      Malnutrition Characteristics:      Nutrition Interventions:     RN Pressure Injury Documentation:     Consultants:     Procedures:     Antimicrobials:       Subjective: Patient is feeling well, continue to have difficulty walking and forgetfulness, no nausea or vomiting, no chest pain or dyspnea.    Objective: Vitals:   04/07/19 0300 04/07/19 0330 04/07/19 0400 04/07/19 0439  BP: 131/79 (!) 119/103 132/70 (!) 146/84  Pulse: (!) 46 (!) 139 73 66  Resp: (!) 22 19 15  (!) 23  Temp:    97.6 F (36.4 C)  TempSrc:    Oral  SpO2: 96% 98% 99% 95%  Weight:    (!) 168.4 kg  Height:    6\' 4"  (1.93 m)    Intake/Output Summary (Last 24 hours) at 04/07/2019 1229  Last data filed at 04/07/2019 0900 Gross per 24 hour  Intake 178.61 ml  Output -  Net 178.61 ml   Filed Weights   04/07/19 0033 04/07/19 0042 04/07/19 0439  Weight: (!) 169.2 kg 108.9 kg (!) 168.4 kg    Examination:   General: not in pain or dyspnea.  Neurology: Awake and alert, non focal  E ENT: no pallor, no icterus, oral  mucosa moist Cardiovascular: No JVD. S1-S2 present, irregularly irregular with no gallops, rubs, or murmurs. No lower extremity edema. Pulmonary: positive breath sounds bilaterally, adequate air movement, no wheezing, rhonchi or rales. Gastrointestinal. Abdomen with no organomegaly, non tender, no rebound or guarding Skin. No rashes Musculoskeletal: no joint deformities     Data Reviewed: I have personally reviewed following labs and imaging studies  CBC: Recent Labs  Lab 04/07/19 0123 04/07/19 0452  WBC 9.4 10.6*  NEUTROABS 7.0  --   HGB 16.1 15.8  HCT 51.5 51.5  MCV 90.5 90.2  PLT 245 944   Basic Metabolic Panel: Recent Labs  Lab 04/07/19 0123 04/07/19 0452  NA 141 140  K 3.2* 3.6  CL 99 99  CO2 28 29  GLUCOSE 163* 128*  BUN 22 21  CREATININE 0.81 0.73  CALCIUM 8.8* 8.9  MG  --  2.0  PHOS  --  3.4   GFR: Estimated Creatinine Clearance: 141 mL/min (by C-G formula based on SCr of 0.73 mg/dL). Liver Function Tests: Recent Labs  Lab 04/07/19 0123  AST 21  ALT 26  ALKPHOS 72  BILITOT 0.4  PROT 7.1  ALBUMIN 3.9   No results for input(s): LIPASE, AMYLASE in the last 168 hours. Recent Labs  Lab 04/03/19 1402 04/07/19 0123  AMMONIA 47* 40*   Coagulation Profile: No results for input(s): INR, PROTIME in the last 168 hours. Cardiac Enzymes: Recent Labs  Lab 04/07/19 0123  TROPONINI <0.03   BNP (last 3 results) Recent Labs    04/03/19 1402  PROBNP 383.0*   HbA1C: No results for input(s): HGBA1C in the last 72 hours. CBG: Recent Labs  Lab 04/07/19 0809 04/07/19 1136  GLUCAP 110* 122*   Lipid Profile: No results for input(s): CHOL, HDL, LDLCALC, TRIG, CHOLHDL, LDLDIRECT in the last 72 hours. Thyroid Function Tests: Recent Labs    04/07/19 0452  TSH 2.079   Anemia Panel: No results for input(s): VITAMINB12, FOLATE, FERRITIN, TIBC, IRON, RETICCTPCT in the last 72 hours.    Radiology Studies: I have reviewed all of the imaging during  this hospital visit personally     Scheduled Meds: . aspirin EC  81 mg Oral Daily  . atorvastatin  20 mg Oral Daily  . clobetasol cream   Topical BID  . enoxaparin (LOVENOX) injection  40 mg Subcutaneous Q12H  . FLUoxetine  40 mg Oral Daily  . furosemide  40 mg Oral Daily  . insulin aspart  0-15 Units Subcutaneous TID WC  . insulin aspart  0-5 Units Subcutaneous QHS  . ipratropium-albuterol  3 mL Nebulization BID  . irbesartan  300 mg Oral Daily  . metoprolol  200 mg Oral Daily  . spironolactone  25 mg Oral Daily   Continuous Infusions: . sodium chloride 75 mL/hr at 04/07/19 0623     LOS: 0 days        Ryleigh Buenger Gerome Apley, MD

## 2019-04-07 NOTE — ED Notes (Signed)
Pt asked to wear a mask with staff in the room, pt states "I can't breathe with the mask on, so I'm not going to wear it".

## 2019-04-07 NOTE — ED Provider Notes (Signed)
[name]   CSN: [id number] Arrival date & time: 04/07/19  0018    History   Chief Complaint Chief Complaint  Patient presents with   Shortness of Breath   Altered Mental Status    HPI [name] is a 73 y.o. male.     Patient presents to the emergency department for altered mental status.  He comes to the ER by ambulance.  Patient was seen by his doctor a week ago for the symptoms but they have progressively worsened.  Patient is obviously confused and disoriented at arrival, level 5 caveat for mental status change.     Past Medical History:  Diagnosis Date   Chronic systolic CHF (congestive heart failure) ([place])    a. Echo (08/28/13): Mild LVH, EF 35-40%, diffuse HK, moderate to severe LAE.   Diabetes mellitus without complication ([place])    Diverticulitis    Headache(784.0)    Hyperlipidemia    Hypertension    Lymphoma, non Hodgkin's    sees Dr. [name]    Morbid obesity [facility])    NICM (nonischemic cardiomyopathy) ([place])    a. R/L Heart cath 08/30/13: RA mean 8, RV 30/0, PA 27/12, mean PCWP 9, CO 5.67, CI 1.98; normal coronary arteries   Sleep apnea     Patient Active Problem List   Diagnosis Date Noted   Psoriasis of scalp 08/15/2018   Anxiety, generalized 01/02/2017   Type 2 diabetes, HbA1c goal < 7% ([place]) 44/92/0100   Chronic systolic heart failure ([place]) 09/09/2013   NICM (nonischemic cardiomyopathy) ([place]) 09/09/2013   HLD (hyperlipidemia) 09/09/2013   OSA (obstructive sleep apnea) 71/21/9758   Acute systolic CHF (congestive heart failure), NYHA class 2 ([place]) 08/27/2013   LEG EDEMA 09/14/2009   NON-HODGKIN'S LYMPHOMA 09/24/2008   VERTIGO 09/24/2008   ABDOMINAL MASS 10/26/2007   Essential hypertension 10/23/2007   NEPHROLITHIASIS 10/23/2007   HEADACHE 06/07/2007    Past Surgical History:  Procedure Laterality Date   HERNIA REPAIR     umblical   incarcerated hernia      vental 11/19/08 Dr. [name]   LEFT AND RIGHT HEART CATHETERIZATION WITH CORONARY ANGIOGRAM N/A 08/30/2013   Procedure: LEFT AND RIGHT HEART CATHETERIZATION WITH CORONARY ANGIOGRAM;  Surgeon: [name], MD;  Location: [facility] CATH LAB;  Service: Cardiovascular;  Laterality: N/A;        Home Medications    Prior to Admission medications   Medication Sig Start Date End Date Taking? Authorizing Provider  amLODipine (NORVASC) 5 MG tablet Take 1 tablet (5 mg total) by mouth daily. 01/28/19   [name], MD  aspirin 81 MG tablet Take 81 mg by mouth daily.    [provider]  atorvastatin (LIPITOR) 20 MG tablet Take 1 tablet (20 mg total) by mouth daily. 01/28/19   [name], MD  clobetasol (TEMOVATE) 0.05 % external solution Apply topically 2 (two) times daily. 01/28/19   [name], MD  Cyanocobalamin (VITAMIN B 12 PO) Take by mouth daily.    [provider]  cyclobenzaprine (FLEXERIL) 10 MG tablet Take 1 tablet (10 mg total) by mouth 3 (three) times daily as needed for muscle spasms. 11/22/16   [name], MD  diazepam (VALIUM) 5 MG tablet Take 1 tablet (5 mg total) by mouth every 12 (twelve) hours as needed for anxiety. 04/03/19   [name], MD  FLUoxetine (PROZAC) 40 MG capsule Take 1 capsule (40 mg total) by mouth daily.  01/28/19   [name], MD  furosemide (LASIX) 40 MG tablet Take 1 tablet (40 mg total) by mouth daily. 01/28/19   [name], MD  meclizine (ANTIVERT) 25 MG tablet Take 1 tablet (25 mg total) by mouth every 4 (four) hours as needed for dizziness. 02/14/17   [name], MD  metFORMIN (GLUCOPHAGE) 500 MG tablet TAKE 1 TABLET BY MOUTH TWICE DAILY WITH A MEAL 01/28/19   [name], MD  metoprolol (TOPROL-XL) 200 MG 24 hr tablet Take 1 tablet (200 mg total) by mouth daily. 01/28/19   [name], MD  olmesartan (BENICAR) 40 MG tablet Take 1 tablet (40 mg total) by mouth daily. 01/28/19   [name], MD  Omega-3 Fatty Acids  (OMEGA 3 PO) Take 2-3 capsules by mouth daily.     [provider]  polyethylene glycol (MIRALAX / GLYCOLAX) packet Take 17 g by mouth daily as needed for moderate constipation.    [provider]  spironolactone (ALDACTONE) 25 MG tablet Take 1 tablet (25 mg total) by mouth daily. 01/28/19   [name], MD    Family History Family History  Problem Relation Age of Onset   Heart failure Mother    Hypertension Mother    Cancer Father        colon   Hypertension Father    Diabetes Father    Stroke Maternal Grandmother    Diabetes Paternal Grandfather    Cancer Maternal Grandfather     Social History Social History   Tobacco Use   Smoking status: Former Smoker    Packs/day: 2.00    Years: 52.00    Pack years: 104.00    Types: Cigarettes    Start date: 08/27/1965    Quit date: 08/28/1999    Years since quitting: 19.6   Smokeless tobacco: Never Used   Tobacco comment: quit 13 years ago  Substance Use Topics   Alcohol use: Yes    Alcohol/week: 0.0 standard drinks    Comment: occ   Drug use: No     Allergies   Patient has no known allergies.   Review of Systems Review of Systems  Unable to perform ROS: Mental status change     Physical Exam Updated Vital Signs BP 129/73 (BP Location: Left Arm)    Pulse (!) 55    Temp 97.9 F (36.6 C) (Oral)    Resp 17    Ht 6\' 4"  (1.93 m)    Wt 108.9 kg    SpO2 95%    BMI 29.21 kg/m   Physical Exam Vitals signs and nursing note reviewed.  Constitutional:      General: He is not in acute distress.    Appearance: Normal appearance. He is well-developed.  HENT:     Head: Normocephalic and atraumatic.     Right Ear: Hearing normal.     Left Ear: Hearing normal.     Nose: Nose normal.  Eyes:     Conjunctiva/sclera: Conjunctivae normal.     Pupils: Pupils are equal, round, and reactive to light.  Neck:     Musculoskeletal: Normal range of motion and neck supple.  Cardiovascular:     Rate and  Rhythm: Regular rhythm.     Heart sounds: S1 normal and S2 normal. No murmur. No friction rub. No gallop.   Pulmonary:     Effort: Pulmonary effort is normal. No respiratory distress.     Breath sounds: Decreased breath sounds present.  Chest:     Chest wall: No tenderness.  Abdominal:     General: Bowel sounds are normal.     Palpations: Abdomen is soft.     Tenderness: There is no abdominal tenderness. There is no guarding or rebound. Negative signs include Murphy's sign and McBurney's sign.     Hernia: No hernia is present.  Musculoskeletal: Normal range of motion.     Right lower leg: Edema present.     Left lower leg: Edema present.  Skin:    General: Skin is warm and dry.     Findings: No rash.     Comments: Chronic stasis dermatitis bilateral lower extremities  Neurological:     Mental Status: He is alert. He is disoriented.     GCS: GCS eye subscore is 4. GCS verbal subscore is 4. GCS motor subscore is 6.     Cranial Nerves: No cranial nerve deficit.     Sensory: No sensory deficit.     Coordination: Coordination normal.     Comments: Patient oriented to person and place, not oriented to time.  He knows it is 2020 but does not know the day or month  Psychiatric:        Speech: Speech normal.        Behavior: Behavior normal.        Thought Content: Thought content normal.      ED Treatments / Results  Labs (all labs ordered are listed, but only abnormal results are displayed) Labs Reviewed  COMPREHENSIVE METABOLIC PANEL - Abnormal; Notable for the following components:      Result Value   Potassium 3.2 (*)    Glucose, Bld 163 (*)    Calcium 8.8 (*)    All other components within normal limits  BRAIN NATRIURETIC PEPTIDE - Abnormal; Notable for the following components:   B Natriuretic Peptide 599.2 (*)    All other components within normal limits  URINALYSIS, ROUTINE W REFLEX MICROSCOPIC - Abnormal; Notable for the following components:   APPearance HAZY (*)     Protein, ur 30 (*)    All other components within normal limits  AMMONIA - Abnormal; Notable for the following components:   Ammonia 40 (*)    All other components within normal limits  SARS CORONAVIRUS 2 (HOSPITAL ORDER, [facility])  URINE CULTURE  CBC WITH DIFFERENTIAL/PLATELET  TROPONIN I  LACTIC ACID, PLASMA  ETHANOL  RAPID URINE DRUG SCREEN, HOSP PERFORMED    EKG EKG Interpretation  Date/Time:  Sunday April 07 2019 00:36:56 EDT Ventricular Rate:  59 PR Interval:    QRS Duration: 130 QT Interval:  451 QTC Calculation: 447 R Axis:   -53 Text Interpretation:  Atrial fibrillation Ventricular premature complex Nonspecific IVCD with LAD LVH with secondary repolarization abnormality Anterior Q waves, possibly due to LVH Confirmed by Orpah Greek [phone]) on 04/07/2019 2:39:10 AM   Radiology Dg Chest 2 View  Result Date: 04/07/2019 CLINICAL DATA:  Altered mental status today.  Shortness of breath. EXAM: CHEST - 2 VIEW COMPARISON:  PA and lateral chest 04/03/2019 and 09/27/2015. FINDINGS: Lungs clear. Heart size normal. No pneumothorax or pleural effusion. No acute or focal bony abnormality. IMPRESSION: No acute disease. Electronically Signed   By: Inge Rise M.D.   On: 04/07/2019 01:51   Ct Head Wo Contrast  Result Date: 04/07/2019 CLINICAL DATA:  Altered mental status.  Hallucinations. EXAM: CT HEAD WITHOUT CONTRAST TECHNIQUE: Contiguous axial images were obtained from the  base of the skull through the vertex without intravenous contrast. COMPARISON:  Brain MRI 10/18/2008.  Head CT 12/07/2007. FINDINGS: Brain: No evidence of acute infarction, hemorrhage, hydrocephalus, extra-axial collection or mass lesion/mass effect. Mild atrophy noted. Vascular: No hyperdense vessel or unexpected calcification. Skull: Intact.  No focal lesion. Sinuses/Orbits: Left mastoid effusion is seen. There is scattered ethmoid air cell disease and minimal mucosal  thickening in the left sphenoid sinus. Other: None. IMPRESSION: No acute intracranial abnormality. Mild atrophy. Left mastoid effusion. Mild ethmoid air cell and left sphenoid sinus disease. Electronically Signed   By: Inge Rise M.D.   On: 04/07/2019 01:49    Procedures Procedures (including critical care time)  Medications Ordered in ED Medications  potassium chloride SA (K-DUR) CR tablet 40 mEq (40 mEq Oral Given 04/07/19 0247)     Initial Impression / Assessment and Plan / ED Course  I have reviewed the triage vital signs and the nursing notes.  Pertinent labs & imaging results that were available during my care of the patient were reviewed by me and considered in my medical decision making (see chart for details).        Patient sent to the emergency department for evaluation of increasing confusion.  Records reviewed, patient saw PCP on June 17 for similar symptoms.  Patient had worsening cognition, memory loss as well as auditory and visual hallucinations at that time.  Appropriate work-up has been initiated as an outpatient, patient has not had the MRI or neurology follow-up.  Family felt that patient is significantly worsened.  Work-up has not revealed a reason for his agitation, confusion or hallucinations.  As discussed by Dr. [name], his PCP, could be an acute delirium, although no sign of infection or other abnormality noted.  He might be experiencing worsening dementia, but at this point I do not feel that the patient is appropriate for discharge.  I do not feel that patient can be medically cleared for [facility] psych evaluation until he has more thorough neurologic evaluation including possible MRI.  We will therefore ask for admission for patient for further work-up.  Final Clinical Impressions(s) / ED Diagnoses   Final diagnoses:  Delirium    ED Discharge Orders    None       Orpah Greek, MD 04/07/19 [phone]

## 2019-04-07 NOTE — Evaluation (Signed)
Physical Therapy Evaluation Patient Details Name: [name] MRN: [id number] DOB: 08-20-1946 Today's Date: 04/07/2019   History of Present Illness  73 y.o. male with a known history of CHF, DM, HTN, HLD, presents to the emergency department for evaluation of AMS.  MRI pending  Clinical Impression  Pt admitted with above diagnosis. Pt currently with functional limitations due to the deficits listed below (see PT Problem List).  Pt confused but cooperative with PT, pt with overall unsteady gait/fatigues with amb so will follow in acute setting. Do not feel pt will need post acute f/u.   Pt will benefit from skilled PT to increase their independence and safety with mobility to allow discharge to the venue listed below.       Follow Up Recommendations No PT follow up    Equipment Recommendations  None recommended by PT    Recommendations for Other Services       Precautions / Restrictions Precautions Precautions: Fall      Mobility  Bed Mobility               General bed mobility comments: pt in recliner  Transfers Overall transfer level: Needs assistance Equipment used: Rolling walker (2 wheeled) Transfers: Sit to/from Stand Sit to Stand: Min assist;Min guard         General transfer comment: for safety  Ambulation/Gait Ambulation/Gait assistance: Min guard;Min assist Gait Distance (Feet): 150 Feet Assistive device: None Gait Pattern/deviations: Step-through pattern;Wide base of support;Drifts right/left     General Gait Details: pt with overall unsteady gait, decr push off bil,  LOB x3 with min/guard to recover and pt with delayed reactions  Stairs            Wheelchair Mobility    Modified Rankin (Stroke Patients Only)       Balance Overall balance assessment: Needs assistance   Sitting balance-Leahy Scale: Good       Standing balance-Leahy Scale: Fair Standing balance comment: unable to tolerate challenges, unsteady with wt  shifting , dynamic tasks                             Pertinent Vitals/Pain Pain Assessment: No/denies pain    Home Living Family/patient expects to be discharged to:: Private residence Living Arrangements: Spouse/significant other Available Help at Discharge: Family             Additional Comments: pt unable to give info d/t confusion    Prior Function Level of Independence: Independent         Comments: pt states he was fully independent  prior to admission     Hand Dominance        Extremity/Trunk Assessment                Communication   Communication: No difficulties  Cognition Arousal/Alertness: Awake/alert Behavior During Therapy: WFL for tasks assessed/performed Overall Cognitive Status: Impaired/Different from baseline Area of Impairment: Memory;Following commands;Safety/judgement                     Memory: Decreased short-term memory Following Commands: Follows one step commands consistently Safety/Judgement: Decreased awareness of safety;Decreased awareness of deficits            General Comments      Exercises     Assessment/Plan    PT Assessment Patient needs continued PT services  PT Problem List Decreased mobility;Decreased safety awareness;Decreased balance;Decreased activity tolerance  PT Treatment Interventions Gait training;Therapeutic exercise;Therapeutic activities;Patient/family education;Functional mobility training;Balance training    PT Goals (Current goals can be found in the Care Plan section)  Acute Rehab PT Goals PT Goal Formulation: With patient Time For Goal Achievement: 04/21/19 Potential to Achieve Goals: Good    Frequency Min 3X/week   Barriers to discharge        Co-evaluation               AM-PAC PT "6 Clicks" Mobility  Outcome Measure Help needed turning from your back to your side while in a flat bed without using bedrails?: A Little Help needed moving from  lying on your back to sitting on the side of a flat bed without using bedrails?: A Little Help needed moving to and from a bed to a chair (including a wheelchair)?: A Little Help needed standing up from a chair using your arms (e.g., wheelchair or bedside chair)?: A Little Help needed to walk in hospital room?: A Little Help needed climbing 3-5 steps with a railing? : A Little 6 Click Score: 18    End of Session Equipment Utilized During Treatment: Gait belt Activity Tolerance: Patient tolerated treatment well Patient left: with call bell/phone within reach;in chair   PT Visit Diagnosis: Unsteadiness on feet (R26.81)    Time: 4287-6811 PT Time Calculation (min) (ACUTE ONLY): 14 min   Charges:   PT Evaluation $PT Eval Low Complexity: 1 Low          Kenyon Ana, PT  Pager: [phone] Acute Rehab Dept [facility]): [phone]   04/07/2019   [facility] 04/07/2019, 2:20 PM

## 2019-04-07 NOTE — ED Notes (Signed)
ED TO INPATIENT HANDOFF REPORT  Name/Age/Gender [name] 73 y.o. male  Code Status Code Status History    Date Active Date Inactive Code Status Order ID Comments User Context   08/27/2013 1253 08/31/2013 1721 Full Code [id number]  Kelvin Cellar, MD Inpatient   Advance Care Planning Activity      Home/SNF/Other Home  Chief Complaint SOB, poss UTI  Level of Care/Admitting Diagnosis ED Disposition    ED Disposition Condition Comment   Admit  Hospital Area: [facility] [[id number]]  Level of Care: Med-Surg [16]  Covid Evaluation: Confirmed COVID Negative  Diagnosis: Altered mental status [780.97.ICD-9-CM]  Admitting Physician: Harvie Bridge [[id number]]  Attending Physician: Harvie Bridge [[id number]]  Estimated length of stay: past midnight tomorrow  Certification:: I certify this patient will need inpatient services for at least 2 midnights  PT Class (Do Not Modify): Inpatient [101]  PT Acc Code (Do Not Modify): Private [1]       Medical History Past Medical History:  Diagnosis Date  . Chronic systolic CHF (congestive heart failure) ([place])    a. Echo (08/28/13): Mild LVH, EF 35-40%, diffuse HK, moderate to severe LAE.  . Diabetes mellitus without complication ([place])   . Diverticulitis   . Headache(784.0)   . Hyperlipidemia   . Hypertension   . Lymphoma, non Hodgkin's    sees Dr. [name]   . Morbid obesity ([place])   . NICM (nonischemic cardiomyopathy) ([place])    a. R/L Heart cath 08/30/13: RA mean 8, RV 30/0, PA 27/12, mean PCWP 9, CO 5.67, CI 1.98; normal coronary arteries  . Sleep apnea     Allergies No Known Allergies  IV Location/Drains/Wounds Patient Lines/Drains/Airways Status   Active Line/Drains/Airways    None          Labs/Imaging Results for orders placed or performed during the hospital encounter of 04/07/19 (from the past 48 hour(s))  CBC with Differential/Platelet     Status: None   Collection Time: 04/07/19   1:23 AM  Result Value Ref Range   WBC 9.4 4.0 - 10.5 K/uL   RBC 5.69 4.22 - 5.81 MIL/uL   Hemoglobin 16.1 13.0 - 17.0 g/dL   HCT 51.5 39.0 - 52.0 %   MCV 90.5 80.0 - 100.0 fL   MCH 28.3 26.0 - 34.0 pg   MCHC 31.3 30.0 - 36.0 g/dL   RDW 13.9 11.5 - 15.5 %   Platelets 245 150 - 400 K/uL   nRBC 0.0 0.0 - 0.2 %   Neutrophils Relative % 74 %   Neutro Abs 7.0 1.7 - 7.7 K/uL   Lymphocytes Relative 16 %   Lymphs Abs 1.5 0.7 - 4.0 K/uL   Monocytes Relative 7 %   Monocytes Absolute 0.6 0.1 - 1.0 K/uL   Eosinophils Relative 3 %   Eosinophils Absolute 0.3 0.0 - 0.5 K/uL   Basophils Relative 0 %   Basophils Absolute 0.0 0.0 - 0.1 K/uL   Immature Granulocytes 0 %   Abs Immature Granulocytes 0.03 0.00 - 0.07 K/uL    Comment: Performed at [facility], [place] [address] [place], [place] [address]  Comprehensive metabolic panel     Status: Abnormal   Collection Time: 04/07/19  1:23 AM  Result Value Ref Range   Sodium 141 135 - 145 mmol/L   Potassium 3.2 (L) 3.5 - 5.1 mmol/L   Chloride 99 98 - 111 mmol/L   CO2 28 22 - 32 mmol/L   Glucose, Bld 163 (  H) 70 - 99 mg/dL   BUN 22 8 - 23 mg/dL   Creatinine, Ser 0.81 0.61 - 1.24 mg/dL   Calcium 8.8 (L) 8.9 - 10.3 mg/dL   Total Protein 7.1 6.5 - 8.1 g/dL   Albumin 3.9 3.5 - 5.0 g/dL   AST 21 15 - 41 U/L   ALT 26 0 - 44 U/L   Alkaline Phosphatase 72 38 - 126 U/L   Total Bilirubin 0.4 0.3 - 1.2 mg/dL   GFR calc non Af Amer >60 >60 mL/min   GFR calc Af Amer >60 >60 mL/min   Anion gap 14 5 - 15    Comment: Performed at [facility], [place] [address], [place], [place] [address]  Troponin I - ONCE - STAT     Status: None   Collection Time: 04/07/19  1:23 AM  Result Value Ref Range   Troponin I <0.03 <0.03 ng/mL    Comment: Performed at [facility], [place] [address] [place], [place] [address]  Brain natriuretic peptide     Status: Abnormal   Collection Time: 04/07/19  1:23 AM  Result Value Ref  Range   B Natriuretic Peptide 599.2 (H) 0.0 - 100.0 pg/mL    Comment: Performed at [facility], [place] [address] [place], [place] [address]  Lactic acid, plasma     Status: None   Collection Time: 04/07/19  1:23 AM  Result Value Ref Range   Lactic Acid, Venous 1.4 0.5 - 1.9 mmol/L    Comment: Performed at [facility], [place] [address] [place], [place] [address]  Urinalysis, Routine w reflex microscopic     Status: Abnormal   Collection Time: 04/07/19  1:23 AM  Result Value Ref Range   Color, Urine YELLOW YELLOW   APPearance HAZY (A) CLEAR   Specific Gravity, Urine 1.021 1.005 - 1.030   pH 5.0 5.0 - 8.0   Glucose, UA NEGATIVE NEGATIVE mg/dL   Hgb urine dipstick NEGATIVE NEGATIVE   Bilirubin Urine NEGATIVE NEGATIVE   Ketones, ur NEGATIVE NEGATIVE mg/dL   Protein, ur 30 (A) NEGATIVE mg/dL   Nitrite NEGATIVE NEGATIVE   Leukocytes,Ua NEGATIVE NEGATIVE   Bacteria, UA NONE SEEN NONE SEEN   Mucus PRESENT    Hyaline Casts, UA PRESENT    Ca Oxalate Crys, UA PRESENT     Comment: Performed at [place] [address] [place], [place] [address]  Ethanol     Status: None   Collection Time: 04/07/19  1:23 AM  Result Value Ref Range   Alcohol, Ethyl (B) <10 <10 mg/dL    Comment: (NOTE) Lowest detectable limit for serum alcohol is 10 mg/dL. For medical purposes only. Performed at [facility], [place] [address], [place], [place] [address]   Rapid urine drug screen (hospital performed)     Status: None   Collection Time: 04/07/19  1:23 AM  Result Value Ref Range   Opiates NONE DETECTED NONE DETECTED   Cocaine NONE DETECTED NONE DETECTED   Benzodiazepines NONE DETECTED NONE DETECTED   Amphetamines NONE DETECTED NONE DETECTED   Tetrahydrocannabinol NONE DETECTED NONE DETECTED   Barbiturates NONE DETECTED NONE DETECTED    Comment: (NOTE) DRUG SCREEN FOR MEDICAL PURPOSES ONLY.  IF CONFIRMATION IS NEEDED FOR ANY  PURPOSE, NOTIFY LAB WITHIN 5 DAYS. LOWEST DETECTABLE LIMITS FOR URINE DRUG SCREEN Drug Class  Cutoff (ng/mL) Amphetamine and metabolites    1000 Barbiturate and metabolites    200 Benzodiazepine                 299 Tricyclics and metabolites     300 Opiates and metabolites        300 Cocaine and metabolites        300 THC                            50 Performed at [facility], [place] [address] [place], [place] [address]   Ammonia     Status: Abnormal   Collection Time: 04/07/19  1:23 AM  Result Value Ref Range   Ammonia 40 (H) 9 - 35 umol/L    Comment: Performed at [facility], [place] [address] [place], [place] [address]  SARS Coronavirus 2 (CEPHEID - Performed in [place] hospital lab), Hosp Order     Status: None   Collection Time: 04/07/19  1:23 AM   Specimen: Nasopharyngeal Swab  Result Value Ref Range   SARS Coronavirus 2 NEGATIVE NEGATIVE    Comment: (NOTE) If result is NEGATIVE SARS-CoV-2 target nucleic acids are NOT DETECTED. The SARS-CoV-2 RNA is generally detectable in upper and lower  respiratory specimens during the acute phase of infection. The lowest  concentration of SARS-CoV-2 viral copies this assay can detect is 250  copies / mL. A negative result does not preclude SARS-CoV-2 infection  and should not be used as the sole basis for treatment or other  patient management decisions.  A negative result may occur with  improper specimen collection / handling, submission of specimen other  than nasopharyngeal swab, presence of viral mutation(s) within the  areas targeted by this assay, and inadequate number of viral copies  (<250 copies / mL). A negative result must be combined with clinical  observations, patient history, and epidemiological information. If result is POSITIVE SARS-CoV-2 target nucleic acids are DETECTED. The SARS-CoV-2 RNA is generally detectable in upper and lower  respiratory  specimens dur ing the acute phase of infection.  Positive  results are indicative of active infection with SARS-CoV-2.  Clinical  correlation with patient history and other diagnostic information is  necessary to determine patient infection status.  Positive results do  not rule out bacterial infection or co-infection with other viruses. If result is PRESUMPTIVE POSTIVE SARS-CoV-2 nucleic acids MAY BE PRESENT.   A presumptive positive result was obtained on the submitted specimen  and confirmed on repeat testing.  While 2019 novel coronavirus  (SARS-CoV-2) nucleic acids may be present in the submitted sample  additional confirmatory testing may be necessary for epidemiological  and / or clinical management purposes  to differentiate between  SARS-CoV-2 and other Sarbecovirus currently known to infect humans.  If clinically indicated additional testing with an alternate test  methodology [id number]) is advised. The SARS-CoV-2 RNA is generally  detectable in upper and lower respiratory sp ecimens during the acute  phase of infection. The expected result is Negative. Fact Sheet for Patients:  StrictlyIdeas.no Fact Sheet for Healthcare Providers: BankingDealers.co.za This test is not yet approved or cleared by the [place] FDA and has been authorized for detection and/or diagnosis of SARS-CoV-2 by FDA under an Emergency Use Authorization (EUA).  This EUA will remain in effect (meaning this test can be used) for the duration of the COVID-19 declaration under Section 564(b)(1) of the Act, 21 U.S.C.  section 360bbb-3(b)(1), unless the authorization is terminated or revoked sooner. Performed at [facility], [place] [address] [place], [place] [address]    Dg Chest 2 View  Result Date: 04/07/2019 CLINICAL DATA:  Altered mental status today.  Shortness of breath. EXAM: CHEST - 2 VIEW COMPARISON:  PA and lateral chest  04/03/2019 and 09/27/2015. FINDINGS: Lungs clear. Heart size normal. No pneumothorax or pleural effusion. No acute or focal bony abnormality. IMPRESSION: No acute disease. Electronically Signed   By: Inge Rise M.D.   On: 04/07/2019 01:51   Ct Head Wo Contrast  Result Date: 04/07/2019 CLINICAL DATA:  Altered mental status.  Hallucinations. EXAM: CT HEAD WITHOUT CONTRAST TECHNIQUE: Contiguous axial images were obtained from the base of the skull through the vertex without intravenous contrast. COMPARISON:  Brain MRI 10/18/2008.  Head CT 12/07/2007. FINDINGS: Brain: No evidence of acute infarction, hemorrhage, hydrocephalus, extra-axial collection or mass lesion/mass effect. Mild atrophy noted. Vascular: No hyperdense vessel or unexpected calcification. Skull: Intact.  No focal lesion. Sinuses/Orbits: Left mastoid effusion is seen. There is scattered ethmoid air cell disease and minimal mucosal thickening in the left sphenoid sinus. Other: None. IMPRESSION: No acute intracranial abnormality. Mild atrophy. Left mastoid effusion. Mild ethmoid air cell and left sphenoid sinus disease. Electronically Signed   By: Inge Rise M.D.   On: 04/07/2019 01:49    Pending Labs Unresulted Labs (From admission, onward)    Start     Ordered   04/07/19 0105  Urine Culture  Once,   STAT     04/07/19 0105   Signed and Held  CBC  (enoxaparin (LOVENOX)    CrCl >/= 30 ml/min)  Once,   R    Comments: Baseline for enoxaparin therapy IF NOT ALREADY DRAWN.  Notify MD if PLT < 100 K.    Signed and Held   Signed and Held  Creatinine, serum  (enoxaparin (LOVENOX)    CrCl >/= 30 ml/min)  Once,   R    Comments: Baseline for enoxaparin therapy IF NOT ALREADY DRAWN.    Signed and Held   Signed and Held  Creatinine, serum  (enoxaparin (LOVENOX)    CrCl >/= 30 ml/min)  Weekly,   R    Comments: while on enoxaparin therapy    Signed and Held   Signed and Held  Magnesium  Add-on,   R     Signed and Held   Signed and  Held  Phosphorus  Add-on,   R     Signed and Held   Signed and Occupational hygienist morning,   R     Signed and Held   Signed and Held  CBC  Tomorrow morning,   R     Signed and Held   Signed and Held  TSH  Once,   R     Signed and Held          Vitals/Pain Today's Vitals   04/07/19 0033 04/07/19 0036 04/07/19 0042 04/07/19 0212  BP: (!) 149/73   129/73  Pulse: 66   (!) 55  Resp: 14   17  Temp: 97.9 F (36.6 C)     TempSrc: Oral     SpO2: 98% 97%  95%  Weight: (!) 169.2 kg  108.9 kg   Height: 6\' 4"  (1.93 m)  6\' 4"  (1.93 m)   PainSc:   0-No pain     Isolation Precautions No active isolations  Medications Medications  potassium  chloride SA (K-DUR) CR tablet 40 mEq (40 mEq Oral Given 04/07/19 0247)    Mobility walks

## 2019-04-07 NOTE — ED Notes (Signed)
Spoke with daughter, Mickel Baas [phone], who would like to be updated as possible. Daughter stated she is concerned that pt is not aware of how altered he is.

## 2019-04-07 NOTE — Progress Notes (Signed)
ANTICOAGULATION CONSULT NOTE - Initial Consult  Pharmacy Consult for apixaban Indication: atrial fibrillation  No Known Allergies  Patient Measurements: Height: 6\' 4"  (193 cm) Weight: (!) 371 lb 4.1 oz (168.4 kg) IBW/kg (Calculated) : 86.8  Vital Signs: Temp: 97.6 F (36.4 C) (06/21 0439) Temp Source: Oral (06/21 0439) BP: 146/84 (06/21 0439) Pulse Rate: 66 (06/21 0439)  Labs: Recent Labs    04/07/19 0123 04/07/19 0452  HGB 16.1 15.8  HCT 51.5 51.5  PLT 245 282  CREATININE 0.81 0.73  TROPONINI <0.03  --     Estimated Creatinine Clearance: 141 mL/min (by C-G formula based on SCr of 0.73 mg/dL).   Medical History: Past Medical History:  Diagnosis Date  . Chronic systolic CHF (congestive heart failure) ([place])    a. Echo (08/28/13): Mild LVH, EF 35-40%, diffuse HK, moderate to severe LAE.  . Diabetes mellitus without complication ([place])   . Diverticulitis   . Headache(784.0)   . Hyperlipidemia   . Hypertension   . Lymphoma, non Hodgkin's    sees Dr. [name]   . Morbid obesity ([place])   . NICM (nonischemic cardiomyopathy) ([place])    a. R/L Heart cath 08/30/13: RA mean 8, RV 30/0, PA 27/12, mean PCWP 9, CO 5.67, CI 1.98; normal coronary arteries  . Sleep apnea    Assessment: Patient admitted with AMS. Diagnosed with new onset atrial fibrillation. Pharmacy consulted to dose apixaban. Pt was not on anticoagulants PTA. On enoxaparin 40 mg subQ daily for DVT ppx inpatient.  Baseline labs reviewed.  Today, 04/07/19  Hgb 15.8 - WNL  Plt 282 - WNL  SCr 0.73 - WNL.  Enoxaparin discontinued  Goal of Therapy:  Monitor platelets by anticoagulation protocol: Yes   Plan:   Apixaban 5 mg PO BID  Recheck CBC with AM labs tomorrow and at least every 72 hours while inpatient  Monitor for signs/symptoms of bleeding or thrombosis  Lenis Noon, PharmD 04/07/2019,2:57 PM

## 2019-04-07 NOTE — ED Notes (Signed)
Bed: LD78 Expected date:  Expected time:  Means of arrival:  Comments: EMS 73 yo SOB/difficulty urinating x 1 week/tachypneic-hx UTI-family states patient hallucinating

## 2019-04-07 NOTE — ED Triage Notes (Signed)
Per EMS - Was seen by PCP 1 week ago for similar symptoms. AMS has increased now to "rambling" and visual hallucinations. Complains of SOB, Denies any chest pain. States he is not able to take a deep breath. Also having trouble urinating. Hx of UTI. Hx CHF     Afib w/ pvc's  55HR  150 BP  CBG 150  20 g lt hand

## 2019-04-07 NOTE — ED Notes (Signed)
Spoke with daughter, Mickel Baas [phone], and updated her to pt condition and status

## 2019-04-07 NOTE — H&P (Signed)
History and Physical   [facility] - Learned @ [place] Admission History and Physical [employer], D.O.    Patient Name: [name] MR#: [id number] Date of Birth: Sep 08, 1946 Date of Admission: 04/07/2019  Referring MD/NP/PA: Dr. Betsey Holiday Primary Care Physician: [name], MD  Chief Complaint:  Chief Complaint  Patient presents with  . Shortness of Breath  . Altered Mental Status  Please note the entire history is obtained from the patient's emergency department chart, emergency department provider and records. Patient's personal history is limited by dementia and altered mental status.   HPI: [name] is a 73 y.o. male with a known history of CHF, DM, HTN, HLD, presents to the emergency department for evaluation of AMS.  Patient was in a usual state of health until several weeks ago when he underwent a workup with his PCP for progressively worsening mental status, confusion, memory loss as well as hallucinations. He reports feeling better, is concerned that his confusion and hallucinations  May be 2/2 medications.  At present he has no complaints  Otherwise there has been no change in status. Patient has been taking medication as prescribed and there has been no recent change in medication or diet.  No recent antibiotics.  There has been no recent illness, hospitalizations, travel or sick contacts.    EMS/ED Course: Patient received KCl . Medical admission has been requested for further management of AMS.  Review of Systems:  Denies all.  CONSTITUTIONAL: No fever/chills, fatigue, weakness, weight gain/loss, headache. EYES: No blurry or double vision. ENT: No tinnitus, postnasal drip, redness or soreness of the oropharynx. RESPIRATORY: No cough, dyspnea, wheeze, hemoptysis.  CARDIOVASCULAR: No chest pain, palpitations, syncope, orthopnea,  GASTROINTESTINAL: No nausea, vomiting, constipation, diarrhea, abdominal pain, hematemesis, melena or  hematochezia. GENITOURINARY: No dysuria, frequency, hematuria. ENDOCRINE: No polyuria or nocturia. No heat or cold intolerance. HEMATOLOGY: No anemia, bruising, bleeding. INTEGUMENTARY: No rashes, ulcers, lesions. MUSCULOSKELETAL: No arthritis, gout  NEUROLOGIC: Positive memory loss, confusion, occasional hallucinations. No numbness, tingling, ataxia, seizure-type activity, weakness. PSYCHIATRIC: No anxiety, depression, insomnia.     Past Medical History:  Diagnosis Date  . Chronic systolic CHF (congestive heart failure) ([place])    a. Echo (08/28/13): Mild LVH, EF 35-40%, diffuse HK, moderate to severe LAE.  . Diabetes mellitus without complication ([place])   . Diverticulitis   . Headache(784.0)   . Hyperlipidemia   . Hypertension   . Lymphoma, non Hodgkin's    sees Dr. [name]   . Morbid obesity ([place])   . NICM (nonischemic cardiomyopathy) ([place])    a. R/L Heart cath 08/30/13: RA mean 8, RV 30/0, PA 27/12, mean PCWP 9, CO 5.67, CI 1.98; normal coronary arteries  . Sleep apnea     Past Surgical History:  Procedure Laterality Date  . HERNIA REPAIR     umblical  . incarcerated hernia     vental 11/19/08 Dr. [name]  . LEFT AND RIGHT HEART CATHETERIZATION WITH CORONARY ANGIOGRAM N/A 08/30/2013   Procedure: LEFT AND RIGHT HEART CATHETERIZATION WITH CORONARY ANGIOGRAM;  Surgeon: [name], MD;  Location: [facility] CATH LAB;  Service: Cardiovascular;  Laterality: N/A;     reports that he quit smoking about 19 years ago. His smoking use included cigarettes. He started smoking about 53 years ago. He has a 104.00 pack-year smoking history. He has never used smokeless tobacco. He reports current alcohol use. He reports that he does not use drugs.  No Known Allergies  Family History  Problem  Relation Age of Onset  . Heart failure Mother   . Hypertension Mother   . Cancer Father        colon  . Hypertension Father   . Diabetes Father   . Stroke Maternal Grandmother   . Diabetes  Paternal Grandfather   . Cancer Maternal Grandfather     Prior to Admission medications   Medication Sig Start Date End Date Taking? Authorizing Provider  amLODipine (NORVASC) 5 MG tablet Take 1 tablet (5 mg total) by mouth daily. 01/28/19   [name], MD  aspirin 81 MG tablet Take 81 mg by mouth daily.    [provider]  atorvastatin (LIPITOR) 20 MG tablet Take 1 tablet (20 mg total) by mouth daily. 01/28/19   [name], MD  clobetasol (TEMOVATE) 0.05 % external solution Apply topically 2 (two) times daily. 01/28/19   [name], MD  Cyanocobalamin (VITAMIN B 12 PO) Take by mouth daily.    [provider]  cyclobenzaprine (FLEXERIL) 10 MG tablet Take 1 tablet (10 mg total) by mouth 3 (three) times daily as needed for muscle spasms. 11/22/16   [name], MD  diazepam (VALIUM) 5 MG tablet Take 1 tablet (5 mg total) by mouth every 12 (twelve) hours as needed for anxiety. 04/03/19   [name], MD  FLUoxetine (PROZAC) 40 MG capsule Take 1 capsule (40 mg total) by mouth daily. 01/28/19   [name], MD  furosemide (LASIX) 40 MG tablet Take 1 tablet (40 mg total) by mouth daily. 01/28/19   [name], MD  meclizine (ANTIVERT) 25 MG tablet Take 1 tablet (25 mg total) by mouth every 4 (four) hours as needed for dizziness. 02/14/17   [name], MD  metFORMIN (GLUCOPHAGE) 500 MG tablet TAKE 1 TABLET BY MOUTH TWICE DAILY WITH A MEAL 01/28/19   [name], MD  metoprolol (TOPROL-XL) 200 MG 24 hr tablet Take 1 tablet (200 mg total) by mouth daily. 01/28/19   [name], MD  olmesartan (BENICAR) 40 MG tablet Take 1 tablet (40 mg total) by mouth daily. 01/28/19   [name], MD  Omega-3 Fatty Acids (OMEGA 3 PO) Take 2-3 capsules by mouth daily.     [provider]  polyethylene glycol (MIRALAX / GLYCOLAX) packet Take 17 g by mouth daily as needed for moderate constipation.    [provider]  spironolactone (ALDACTONE) 25 MG tablet Take 1  tablet (25 mg total) by mouth daily. 01/28/19   [name], MD    Physical Exam: Vitals:   04/07/19 0033 04/07/19 0036 04/07/19 0042 04/07/19 0212  BP: (!) 149/73   129/73  Pulse: 66   (!) 55  Resp: 14   17  Temp: 97.9 F (36.6 C)     TempSrc: Oral     SpO2: 98% 97%  95%  Weight: (!) 169.2 kg  108.9 kg   Height: 6\' 4"  (1.93 m)  6\' 4"  (1.93 m)     GENERAL: 73 y.o.-year-old male patient, well-developed, well-nourished lying in the bed in no acute distress.  Pleasant and cooperative.   HEENT: Head atraumatic, normocephalic. Pupils equal. Mucus membranes moist. NECK: Supple. No JVD. CHEST: Normal breath sounds bilaterally. No wheezing, rales, rhonchi or crackles. No use of accessory muscles of respiration.  No reproducible chest wall tenderness.  CARDIOVASCULAR: S1, S2 normal. No murmurs, rubs, or gallops. Cap refill <2 seconds. Pulses intact distally.  ABDOMEN: Soft, nondistended, nontender.  No rebound, guarding, rigidity. Normoactive bowel sounds present in all four quadrants.  EXTREMITIES: No pedal edema, cyanosis, or clubbing. No calf tenderness or Homan's sign.  NEUROLOGIC: The patient is alert and oriented x 3. Cranial nerves II through XII are grossly intact with no focal sensorimotor deficit. Short term memory impaired.  PSYCHIATRIC:  Normal affect, mood, thought content. SKIN: Warm, dry, and intact without obvious rash, lesion, or ulcer.    Labs on Admission:  CBC: Recent Labs  Lab 04/07/19 0123  WBC 9.4  NEUTROABS 7.0  HGB 16.1  HCT 51.5  MCV 90.5  PLT 154   Basic Metabolic Panel: Recent Labs  Lab 04/07/19 0123  NA 141  K 3.2*  CL 99  CO2 28  GLUCOSE 163*  BUN 22  CREATININE 0.81  CALCIUM 8.8*   GFR: Estimated Creatinine Clearance: 111.5 mL/min (by C-G formula based on SCr of 0.81 mg/dL). Liver Function Tests: Recent Labs  Lab 04/07/19 0123  AST 21  ALT 26  ALKPHOS 72  BILITOT 0.4  PROT 7.1  ALBUMIN 3.9   No results for input(s): LIPASE,  AMYLASE in the last 168 hours. Recent Labs  Lab 04/03/19 1402 04/07/19 0123  AMMONIA 47* 40*   Coagulation Profile: No results for input(s): INR, PROTIME in the last 168 hours. Cardiac Enzymes: Recent Labs  Lab 04/07/19 0123  TROPONINI <0.03   BNP (last 3 results) Recent Labs    04/03/19 1402  PROBNP 383.0*   HbA1C: No results for input(s): HGBA1C in the last 72 hours. CBG: No results for input(s): GLUCAP in the last 168 hours. Lipid Profile: No results for input(s): CHOL, HDL, LDLCALC, TRIG, CHOLHDL, LDLDIRECT in the last 72 hours. Thyroid Function Tests: No results for input(s): TSH, T4TOTAL, FREET4, T3FREE, THYROIDAB in the last 72 hours. Anemia Panel: No results for input(s): VITAMINB12, FOLATE, FERRITIN, TIBC, IRON, RETICCTPCT in the last 72 hours. Urine analysis:    Component Value Date/Time   COLORURINE YELLOW 04/07/2019 0123   APPEARANCEUR HAZY (A) 04/07/2019 0123   LABSPEC 1.021 04/07/2019 0123   PHURINE 5.0 04/07/2019 0123   GLUCOSEU NEGATIVE 04/07/2019 0123   HGBUR NEGATIVE 04/07/2019 0123   HGBUR large 10/23/2007 1455   BILIRUBINUR NEGATIVE 04/07/2019 0123   BILIRUBINUR N 03/02/2018 1655   KETONESUR NEGATIVE 04/07/2019 0123   PROTEINUR 30 (A) 04/07/2019 0123   UROBILINOGEN 0.2 03/02/2018 1655   UROBILINOGEN 1.0 11/19/2008 1858   NITRITE NEGATIVE 04/07/2019 0123   LEUKOCYTESUR NEGATIVE 04/07/2019 0123   Sepsis Labs: @LABRCNTIP (procalcitonin:4,lacticidven:4) ) Recent Results (from the past 240 hour(s))  SARS Coronavirus 2 (CEPHEID - Performed in [place] hospital lab), Hosp Order     Status: None   Collection Time: 04/07/19  1:23 AM   Specimen: Nasopharyngeal Swab  Result Value Ref Range Status   SARS Coronavirus 2 NEGATIVE NEGATIVE Final    Comment: (NOTE) If result is NEGATIVE SARS-CoV-2 target nucleic acids are NOT DETECTED. The SARS-CoV-2 RNA is generally detectable in upper and lower  respiratory specimens during the acute phase of  infection. The lowest  concentration of SARS-CoV-2 viral copies this assay can detect is 250  copies / mL. A negative result does not preclude SARS-CoV-2 infection  and should not be used as the sole basis for treatment or other  patient management decisions.  A negative result may occur with  improper specimen collection / handling, submission of specimen other  than nasopharyngeal swab, presence of viral mutation(s) within the  areas targeted by this assay, and  inadequate number of viral copies  (<250 copies / mL). A negative result must be combined with clinical  observations, patient history, and epidemiological information. If result is POSITIVE SARS-CoV-2 target nucleic acids are DETECTED. The SARS-CoV-2 RNA is generally detectable in upper and lower  respiratory specimens dur ing the acute phase of infection.  Positive  results are indicative of active infection with SARS-CoV-2.  Clinical  correlation with patient history and other diagnostic information is  necessary to determine patient infection status.  Positive results do  not rule out bacterial infection or co-infection with other viruses. If result is PRESUMPTIVE POSTIVE SARS-CoV-2 nucleic acids MAY BE PRESENT.   A presumptive positive result was obtained on the submitted specimen  and confirmed on repeat testing.  While 2019 novel coronavirus  (SARS-CoV-2) nucleic acids may be present in the submitted sample  additional confirmatory testing may be necessary for epidemiological  and / or clinical management purposes  to differentiate between  SARS-CoV-2 and other Sarbecovirus currently known to infect humans.  If clinically indicated additional testing with an alternate test  methodology [phone]) is advised. The SARS-CoV-2 RNA is generally  detectable in upper and lower respiratory sp ecimens during the acute  phase of infection. The expected result is Negative. Fact Sheet for Patients:   StrictlyIdeas.no Fact Sheet for Healthcare Providers: BankingDealers.co.za This test is not yet approved or cleared by the [place] FDA and has been authorized for detection and/or diagnosis of SARS-CoV-2 by FDA under an Emergency Use Authorization (EUA).  This EUA will remain in effect (meaning this test can be used) for the duration of the COVID-19 declaration under Section 564(b)(1) of the Act, 21 U.S.C. section 360bbb-3(b)(1), unless the authorization is terminated or revoked sooner. Performed at [facility], [place] [address] [place], [place] [address]      Radiological Exams on Admission: Dg Chest 2 View  Result Date: 04/07/2019 CLINICAL DATA:  Altered mental status today.  Shortness of breath. EXAM: CHEST - 2 VIEW COMPARISON:  PA and lateral chest 04/03/2019 and 09/27/2015. FINDINGS: Lungs clear. Heart size normal. No pneumothorax or pleural effusion. No acute or focal bony abnormality. IMPRESSION: No acute disease. Electronically Signed   By: Inge Rise M.D.   On: 04/07/2019 01:51   Ct Head Wo Contrast  Result Date: 04/07/2019 CLINICAL DATA:  Altered mental status.  Hallucinations. EXAM: CT HEAD WITHOUT CONTRAST TECHNIQUE: Contiguous axial images were obtained from the base of the skull through the vertex without intravenous contrast. COMPARISON:  Brain MRI 10/18/2008.  Head CT 12/07/2007. FINDINGS: Brain: No evidence of acute infarction, hemorrhage, hydrocephalus, extra-axial collection or mass lesion/mass effect. Mild atrophy noted. Vascular: No hyperdense vessel or unexpected calcification. Skull: Intact.  No focal lesion. Sinuses/Orbits: Left mastoid effusion is seen. There is scattered ethmoid air cell disease and minimal mucosal thickening in the left sphenoid sinus. Other: None. IMPRESSION: No acute intracranial abnormality. Mild atrophy. Left mastoid effusion. Mild ethmoid air cell and left sphenoid sinus  disease. Electronically Signed   By: Inge Rise M.D.   On: 04/07/2019 01:49    EKG: Afib at 59 bpm with normal axis and nonspecific ST-T wave changes.   Assessment/Plan  This is a 73 y.o. male with a history of CHF, DM, HTN, HLD,  now being admitted with:  #. AMS ? 2/2 medication vs. progressive dementia. - Admit obs - Check MRI brain - Infectious workup unrevealing, may need neuropsych eval for progressively worsening dementia.  - Fall precautions, ambulate with  assistance - Neurochecks q4h - Discontinue Valium, Flexeril  #. Mild hypokalemia - Replace orally  #. History of HTN, CHF - Continue Norvasc, Lasix, metoprolol, Benicar, spironolactone  #. History of HLD - Continue Lipitor  #. History of H/o Diabetes - Accuchecks achs with RISS coverage - Heart healthy, carb controlled diet  Admission status: IP IV Fluids: NS Diet/Nutrition: HH, CC Consults called: Consider neuro  DVT Px: Lovenox, SCDs and early ambulation. Code Status: Full Code  Disposition Plan: To home in 1-2 days  All the records are reviewed and case discussed with ED provider. Management plans discussed with the patient and/or family who express understanding and agree with plan of care.  Julann Mcgilvray D.O. on 04/07/2019 at 3:41 AM CC: Primary care physician; [name], MD   04/07/2019, 3:41 AM

## 2019-04-07 NOTE — ED Notes (Signed)
Pt is aware that urine sample is needed. He has been given fluids to drink and a urinal. He states he is unable to provide sample at this time but will continue to try.

## 2019-04-08 ENCOUNTER — Other Ambulatory Visit: Payer: Medicare Other

## 2019-04-08 ENCOUNTER — Inpatient Hospital Stay (HOSPITAL_COMMUNITY): Payer: Medicare Other

## 2019-04-08 ENCOUNTER — Encounter: Payer: Self-pay | Admitting: Family Medicine

## 2019-04-08 DIAGNOSIS — G9341 Metabolic encephalopathy: Secondary | ICD-10-CM | POA: Diagnosis not present

## 2019-04-08 DIAGNOSIS — I480 Paroxysmal atrial fibrillation: Secondary | ICD-10-CM

## 2019-04-08 DIAGNOSIS — I5022 Chronic systolic (congestive) heart failure: Secondary | ICD-10-CM

## 2019-04-08 DIAGNOSIS — I10 Essential (primary) hypertension: Secondary | ICD-10-CM

## 2019-04-08 DIAGNOSIS — G4733 Obstructive sleep apnea (adult) (pediatric): Secondary | ICD-10-CM

## 2019-04-08 DIAGNOSIS — R410 Disorientation, unspecified: Secondary | ICD-10-CM | POA: Diagnosis not present

## 2019-04-08 DIAGNOSIS — I4891 Unspecified atrial fibrillation: Secondary | ICD-10-CM | POA: Diagnosis not present

## 2019-04-08 DIAGNOSIS — E119 Type 2 diabetes mellitus without complications: Secondary | ICD-10-CM | POA: Diagnosis not present

## 2019-04-08 LAB — BASIC METABOLIC PANEL
Anion gap: 12 (ref 5–15)
BUN: 29 mg/dL — ABNORMAL HIGH (ref 8–23)
CO2: 30 mmol/L (ref 22–32)
Calcium: 9 mg/dL (ref 8.9–10.3)
Chloride: 98 mmol/L (ref 98–111)
Creatinine, Ser: 1.07 mg/dL (ref 0.61–1.24)
GFR calc Af Amer: >60 mL/min (ref >60)
GFR calc non Af Amer: >60 mL/min (ref >60)
Glucose, Bld: 126 mg/dL — ABNORMAL HIGH (ref 70–99)
Potassium: 4 mmol/L (ref 3.5–5.1)
Sodium: 140 mmol/L (ref 135–145)

## 2019-04-08 LAB — ECHOCARDIOGRAM COMPLETE
Height: 76 in
Weight: 5940.07 oz

## 2019-04-08 LAB — CBC
HCT: 52.5 % — ABNORMAL HIGH (ref 39.0–52.0)
Hemoglobin: 15.7 g/dL (ref 13.0–17.0)
MCH: 27.7 pg (ref 26.0–34.0)
MCHC: 29.9 g/dL — ABNORMAL LOW (ref 30.0–36.0)
MCV: 92.8 fL (ref 80.0–100.0)
Platelets: 251 10*3/uL (ref 150–400)
RBC: 5.66 MIL/uL (ref 4.22–5.81)
RDW: 14 % (ref 11.5–15.5)
WBC: 9.6 10*3/uL (ref 4.0–10.5)
nRBC: 0 % (ref 0.0–0.2)

## 2019-04-08 LAB — GLUCOSE, CAPILLARY
Glucose-Capillary: 120 mg/dL — ABNORMAL HIGH (ref 70–99)
Glucose-Capillary: 113 mg/dL — ABNORMAL HIGH (ref 70–99)

## 2019-04-08 LAB — URINE CULTURE

## 2019-04-08 MED ORDER — APIXABAN 5 MG PO TABS: 5.0000 mg | ORAL_TABLET | Freq: Two times a day (BID) | ORAL | 0 refills | Status: DC

## 2019-04-08 MED ORDER — PERFLUTREN LIPID MICROSPHERE
1.0000 mL | INTRAVENOUS | Status: AC | PRN
  Administered 2019-04-08: 3 mL via INTRAVENOUS
  Filled 2019-04-08: qty 10

## 2019-04-08 MED ORDER — CEPHALEXIN 500 MG PO CAPS: 500.0000 mg | ORAL_CAPSULE | Freq: Four times a day (QID) | ORAL | 0 refills | Status: AC

## 2019-04-08 MED ORDER — CEPHALEXIN 500 MG PO CAPS: 500.0000 mg | ORAL_CAPSULE | Freq: Four times a day (QID) | ORAL | Status: DC

## 2019-04-08 NOTE — Progress Notes (Signed)
MRI called this AM to inform this RN that patient exceeds the weight limit for the MRI machine here at [facility]. Advised that patient needed to be transported to [facility] for MRI to be performed. This RN explained to the patient what the plan of care was for the MRI. Patient states he had MRI in the past and did not tolerate it well. Voiced concerns over having one done today and did not feel it is medically necessary. This RN educated the patient on the risks and benefits of performing the MRI. Patient verbalized understanding of the risks and benefits but ultimately refused the procedure. MRI informed of patient's decision. Will pass off this information to day shift RN, and page MD as well.

## 2019-04-08 NOTE — Telephone Encounter (Signed)
Dr. Fry please advise. Thanks  

## 2019-04-08 NOTE — Telephone Encounter (Signed)
Tell her not to worry about the labs we were going to do. Please cancel these orders. When he gets DC'd from the hospital they will go over the follow up plan. He will need to see a Cardiologist but they may want him to see a rhythm specialist (or EP). They will tell you

## 2019-04-08 NOTE — Progress Notes (Signed)
Spoke with pt wife to provide update on status. Will cont to monitor. SRP, RN

## 2019-04-08 NOTE — Discharge Instructions (Signed)

## 2019-04-08 NOTE — Plan of Care (Signed)
  Problem: Education: Goal: Knowledge of General Education information will improve Description: Including pain rating scale, medication(s)/side effects and non-pharmacologic comfort measures 04/08/2019 1423 by Zadie Rhine, RN Outcome: Adequate for Discharge 04/08/2019 1423 by Zadie Rhine, RN Outcome: Progressing   Problem: Health Behavior/Discharge Planning: Goal: Ability to manage health-related needs will improve 04/08/2019 1423 by Zadie Rhine, RN Outcome: Adequate for Discharge 04/08/2019 1423 by Zadie Rhine, RN Outcome: Progressing   Problem: Clinical Measurements: Goal: Ability to maintain clinical measurements within normal limits will improve 04/08/2019 1423 by Zadie Rhine, RN Outcome: Adequate for Discharge 04/08/2019 1423 by Zadie Rhine, RN Outcome: Progressing Goal: Will remain free from infection 04/08/2019 1423 by Zadie Rhine, RN Outcome: Adequate for Discharge 04/08/2019 1423 by Zadie Rhine, RN Outcome: Progressing Goal: Diagnostic test results will improve 04/08/2019 1423 by Zadie Rhine, RN Outcome: Adequate for Discharge 04/08/2019 1423 by Zadie Rhine, RN Outcome: Progressing Goal: Respiratory complications will improve 04/08/2019 1423 by Zadie Rhine, RN Outcome: Adequate for Discharge 04/08/2019 1423 by Zadie Rhine, RN Outcome: Progressing Goal: Cardiovascular complication will be avoided 04/08/2019 1423 by Zadie Rhine, RN Outcome: Adequate for Discharge 04/08/2019 1423 by Zadie Rhine, RN Outcome: Progressing   Problem: Activity: Goal: Risk for activity intolerance will decrease 04/08/2019 1423 by Zadie Rhine, RN Outcome: Adequate for Discharge 04/08/2019 1423 by Zadie Rhine, RN Outcome: Progressing   Problem: Nutrition: Goal: Adequate nutrition will be maintained 04/08/2019 1423 by Zadie Rhine, RN Outcome: Adequate for Discharge 04/08/2019 1423 by Zadie Rhine, RN Outcome: Progressing   Problem: Coping: Goal: Level of anxiety will decrease 04/08/2019 1423 by Zadie Rhine, RN Outcome: Adequate for Discharge 04/08/2019 1423 by Zadie Rhine, RN Outcome: Progressing   Problem: Elimination: Goal: Will not experience complications related to bowel motility 04/08/2019 1423 by Zadie Rhine, RN Outcome: Adequate for Discharge 04/08/2019 1423 by Zadie Rhine, RN Outcome: Progressing Goal: Will not experience complications related to urinary retention 04/08/2019 1423 by Zadie Rhine, RN Outcome: Adequate for Discharge 04/08/2019 1423 by Zadie Rhine, RN Outcome: Progressing   Problem: Pain Managment: Goal: General experience of comfort will improve 04/08/2019 1423 by Zadie Rhine, RN Outcome: Adequate for Discharge 04/08/2019 1423 by Zadie Rhine, RN Outcome: Progressing   Problem: Safety: Goal: Ability to remain free from injury will improve 04/08/2019 1423 by Zadie Rhine, RN Outcome: Adequate for Discharge 04/08/2019 1423 by Zadie Rhine, RN Outcome: Progressing   Problem: Skin Integrity: Goal: Risk for impaired skin integrity will decrease 04/08/2019 1423 by Zadie Rhine, RN Outcome: Adequate for Discharge 04/08/2019 1423 by Zadie Rhine, RN Outcome: Progressing   Problem: Acute Rehab PT Goals(only PT should resolve) Goal: Pt Will Go Supine/Side To Sit Outcome: Adequate for Discharge Goal: Patient Will Transfer Sit To/From Stand Outcome: Adequate for Discharge Goal: Pt Will Ambulate Outcome: Adequate for Discharge

## 2019-04-08 NOTE — Discharge Summary (Addendum)
Physician Discharge Summary  [name] DXA:[id number] DOB: 1946/03/30 DOA: 04/07/2019  PCP: [name], MD  Admit date: 04/07/2019 Discharge date: 04/08/2019  Admitted From: Home  Disposition:  Home   Recommendations for Outpatient Follow-up and new medication changes:  1. Follow up with Dr. [name] in 7 days.  2. Patient has been placed on apixaban for anticoagulation, new onset atrial fibrillation. 3. Discontinue aspirin for now 4.   Started on cephalexin for 5 days for suspected urine infection.    Home Health: yes. Home nurse for post hospitalization follow up.    Equipment/Devices: no    Discharge Condition: stable  CODE STATUS: full  Diet recommendation: heart healthy and diabetic prudent.   Brief/Interim Summary: 73 year old male who presented with dyspnea and altered mentation.  Patient does have the significant past medical history for heart failure, type 2 diabetes mellitus, hypertension and dyslipidemia.  Reported several weeks of progressive and worsening confusion, memory loss and hallucinations.  On his initial physical examination his blood pressure was 149/73, heart rate 66, respiratory rate 14, temperature 97.9, oxygen saturation 98%.  His lungs were clear to auscultation bilaterally, heart S1-S2 present and rhythmic, the abdomen was soft nontender, patient was awake and alert, following commands, his short-term memory was noted to be impaired.  Sodium 140, potassium 3.2, chloride 99, bicarb 28, glucose 163, BUN 22, creatinine 0.81, AST 21, ALT 26, ammonia 40, white count 9.4, hemoglobin 16.1, hematocrit 51.5, platelets 245.  SARS COVID-19 was negative.  Urinalysis was negative for infection.  Alcohol level less than 10, tox urine screen negative.  Head CT with no acute changes.  Chest radiograph with cardiomegaly, chronic vascular hilar congestion.  EKG 59 bpm, left axis with left anterior fascicular block, prolonged QRS, indeterminate interventricular conduction delay,  atrial fibrillation rhythm, positive PVC, no ST segment or T wave changes.  Patient was admitted to the hospital with the working diagnosis of acute worsening metabolic encephalopathy, new onset atrial fibrillation.   1.  Acute confusional state.  Patient was admitted to the medical ward, he had frequent neuro checks, his symptoms resolved, patient declined further work-up with brain MRI.  Patient was seen by physical therapy, no follow-up recommended.  Patient will follow-up with outpatient neurology.  Old records personally reviewed, he had been seen by forgetfulness by Dr. [name] in the past.   2.  New onset atrial fibrillation.  Patient was placed on a telemetry monitor, he remained in atrial fibrillation rhythm, rate control with metoprolol, his chads vasc score elevated up to 4, patient was placed on apixaban, discontinue aspirin.  Echocardiogram was performed before discharge.  He will need a close follow-up as an outpatient within 7 days.  3.  Systolic heart failure.  No signs of exacerbation, patient will continue metoprolol, it irbesartan, spironolactone and furosemide.  The latest information I can find is from echocardiography 2009, ejection fraction 60% But the medical record lists systolic dysfunction.   4.  Depression.  Continue fluoxetine, apparently patient not taking diazepam anymore.  5.  Type 2 diabetes mellitus.  His glucose remained stable, continue outpatient follow-up. At discharge will resume metformin.   6.  Obesity, morbid, with dyslipidemia.  His calculated BMI 45.1 continue statin therapy.  7.  Transient hypokalemia.  It was corrected with potassium chloride, his kidney function remained stable.  8. HTN. Continue blood pressure control with amlodipine, metoprolol, olmesartan, spironolactone and furosemide.   9. Suspected urine infection. His urine analysis had no leukocytes, urine culture non diagnostic,  but apparently patient did have urinary symptoms, will prescribe  5 days of cephalexin, follow with Dr. [name] as outpatient.   Discharge Diagnoses:  Active Problems:   Essential hypertension   OSA (obstructive sleep apnea)   Type 2 diabetes, HbA1c goal < 7% (HCC)   Altered mental status   Chronic systolic CHF (congestive heart failure) ([place])    Discharge Instructions   Allergies as of 04/08/2019   No Known Allergies     Medication List    STOP taking these medications   aspirin 81 MG tablet   cyclobenzaprine 10 MG tablet Commonly known as: FLEXERIL   diazepam 5 MG tablet Commonly known as: VALIUM   meclizine 25 MG tablet Commonly known as: ANTIVERT     TAKE these medications   amLODipine 5 MG tablet Commonly known as: NORVASC Take 1 tablet (5 mg total) by mouth daily.   apixaban 5 MG Tabs tablet Commonly known as: ELIQUIS Take 1 tablet (5 mg total) by mouth 2 (two) times daily for 30 days.   atorvastatin 20 MG tablet Commonly known as: LIPITOR Take 1 tablet (20 mg total) by mouth daily.   cephALEXin 500 MG capsule Commonly known as: KEFLEX Take 1 capsule (500 mg total) by mouth every 6 (six) hours for 5 days.   clobetasol 0.05 % external solution Commonly known as: TEMOVATE Apply topically 2 (two) times daily. What changed: how much to take   FLUoxetine 40 MG capsule Commonly known as: PROZAC Take 1 capsule (40 mg total) by mouth daily.   furosemide 40 MG tablet Commonly known as: LASIX TAKE 1 TABLET BY MOUTH EVERY DAY   metFORMIN 500 MG tablet Commonly known as: GLUCOPHAGE TAKE 1 TABLET BY MOUTH TWICE DAILY WITH A MEAL What changed:   how much to take  how to take this  when to take this   metoprolol 200 MG 24 hr tablet Commonly known as: TOPROL-XL TAKE 1 TABLET BY MOUTH EVERY DAY   Multivitamin Men 50+ Tabs Take 1 tablet by mouth daily.   olmesartan 40 MG tablet Commonly known as: BENICAR Take 1 tablet (40 mg total) by mouth daily.   polyethylene glycol 17 g packet Commonly known as: MIRALAX /  GLYCOLAX Take 17 g by mouth daily as needed for moderate constipation.   spironolactone 25 MG tablet Commonly known as: ALDACTONE TAKE 1 TABLET BY MOUTH EVERY DAY       No Known Allergies  Consultations:     Procedures/Studies: Dg Chest 2 View  Result Date: 04/07/2019 CLINICAL DATA:  Altered mental status today.  Shortness of breath. EXAM: CHEST - 2 VIEW COMPARISON:  PA and lateral chest 04/03/2019 and 09/27/2015. FINDINGS: Lungs clear. Heart size normal. No pneumothorax or pleural effusion. No acute or focal bony abnormality. IMPRESSION: No acute disease. Electronically Signed   By: Inge Rise M.D.   On: 04/07/2019 01:51   Dg Chest 2 View  Result Date: 04/03/2019 CLINICAL DATA:  Shortness of breath EXAM: CHEST - 2 VIEW COMPARISON:  09/27/2015 FINDINGS: No focal airspace disease or pleural effusion. Mild right lateral pleural thickening. Borderline to mild cardiomegaly. No pneumothorax. IMPRESSION: No active cardiopulmonary disease. Electronically Signed   By: Donavan Foil M.D.   On: 04/03/2019 21:53   Ct Head Wo Contrast  Result Date: 04/07/2019 CLINICAL DATA:  Altered mental status.  Hallucinations. EXAM: CT HEAD WITHOUT CONTRAST TECHNIQUE: Contiguous axial images were obtained from the base of the skull through the vertex without intravenous contrast. COMPARISON:  Brain MRI 10/18/2008.  Head CT 12/07/2007. FINDINGS: Brain: No evidence of acute infarction, hemorrhage, hydrocephalus, extra-axial collection or mass lesion/mass effect. Mild atrophy noted. Vascular: No hyperdense vessel or unexpected calcification. Skull: Intact.  No focal lesion. Sinuses/Orbits: Left mastoid effusion is seen. There is scattered ethmoid air cell disease and minimal mucosal thickening in the left sphenoid sinus. Other: None. IMPRESSION: No acute intracranial abnormality. Mild atrophy. Left mastoid effusion. Mild ethmoid air cell and left sphenoid sinus disease. Electronically Signed   By: Inge Rise M.D.   On: 04/07/2019 01:49      Procedures:   Subjective: Patient with no chest pain, no nausea or vomiting, no further confusion, no agitation. Reported unpleasant experience in the past with brain MRI, declined study today. Agrees to follow up as outpatient.   Discharge Exam: Vitals:   04/07/19 2313 04/08/19 0604  BP: 117/71 105/74  Pulse: (!) 56 60  Resp: 20 20  Temp: 97.8 F (36.6 C) 98.5 F (36.9 C)  SpO2: 93% 93%   Vitals:   04/07/19 1500 04/07/19 2127 04/07/19 2313 04/08/19 0604  BP: 138/74  117/71 105/74  Pulse: 66  (!) 56 60  Resp: 20  20 20   Temp: 98.5 F (36.9 C)  97.8 F (36.6 C) 98.5 F (36.9 C)  TempSrc: Oral  Oral Oral  SpO2: 92% 92% 93% 93%  Weight:      Height:        General: Not in pain or dyspnea  Neurology: Awake and alert, non focal  E ENT: no pallor, no icterus, oral mucosa moist Cardiovascular: No JVD. S1-S2 present, irregularly irregular., no gallops, rubs, or murmurs. No lower extremity edema. Pulmonary: positive breath sounds bilaterally, adequate air movement, no wheezing, rhonchi or rales. Gastrointestinal. Abdomen protuberant with no organomegaly, non tender, no rebound or guarding Skin. No rashes Musculoskeletal: no joint deformities   The results of significant diagnostics from this hospitalization (including imaging, microbiology, ancillary and laboratory) are listed below for reference.     Microbiology: Recent Results (from the past 240 hour(s))  Urine Culture     Status: Abnormal   Collection Time: 04/07/19  1:23 AM   Specimen: Urine, Random  Result Value Ref Range Status   Specimen Description   Final    URINE, RANDOM Performed at [facility] [address], [place], [place] [address]    Special Requests   Final    NONE Performed at [facility], [place] [address], [place], [place] [address]    Culture MULTIPLE SPECIES PRESENT, SUGGEST RECOLLECTION (A)  Final   Report  Status 04/08/2019 FINAL  Final  SARS Coronavirus 2 (CEPHEID - Performed in [place] hospital lab), Hosp Order     Status: None   Collection Time: 04/07/19  1:23 AM   Specimen: Nasopharyngeal Swab  Result Value Ref Range Status   SARS Coronavirus 2 NEGATIVE NEGATIVE Final    Comment: (NOTE) If result is NEGATIVE SARS-CoV-2 target nucleic acids are NOT DETECTED. The SARS-CoV-2 RNA is generally detectable in upper and lower  respiratory specimens during the acute phase of infection. The lowest  concentration of SARS-CoV-2 viral copies this assay can detect is 250  copies / mL. A negative result does not preclude SARS-CoV-2 infection  and should not be used as the sole basis for treatment or other  patient management decisions.  A negative result may occur with  improper specimen collection / handling, submission of specimen other  than nasopharyngeal swab, presence  of viral mutation(s) within the  areas targeted by this assay, and inadequate number of viral copies  (<250 copies / mL). A negative result must be combined with clinical  observations, patient history, and epidemiological information. If result is POSITIVE SARS-CoV-2 target nucleic acids are DETECTED. The SARS-CoV-2 RNA is generally detectable in upper and lower  respiratory specimens dur ing the acute phase of infection.  Positive  results are indicative of active infection with SARS-CoV-2.  Clinical  correlation with patient history and other diagnostic information is  necessary to determine patient infection status.  Positive results do  not rule out bacterial infection or co-infection with other viruses. If result is PRESUMPTIVE POSTIVE SARS-CoV-2 nucleic acids MAY BE PRESENT.   A presumptive positive result was obtained on the submitted specimen  and confirmed on repeat testing.  While 2019 novel coronavirus  (SARS-CoV-2) nucleic acids may be present in the submitted sample  additional confirmatory testing may be  necessary for epidemiological  and / or clinical management purposes  to differentiate between  SARS-CoV-2 and other Sarbecovirus currently known to infect humans.  If clinically indicated additional testing with an alternate test  methodology [phone]) is advised. The SARS-CoV-2 RNA is generally  detectable in upper and lower respiratory sp ecimens during the acute  phase of infection. The expected result is Negative. Fact Sheet for Patients:  StrictlyIdeas.no Fact Sheet for Healthcare Providers: BankingDealers.co.za This test is not yet approved or cleared by the [place] FDA and has been authorized for detection and/or diagnosis of SARS-CoV-2 by FDA under an Emergency Use Authorization (EUA).  This EUA will remain in effect (meaning this test can be used) for the duration of the COVID-19 declaration under Section 564(b)(1) of the Act, 21 U.S.C. section 360bbb-3(b)(1), unless the authorization is terminated or revoked sooner. Performed at [facility], [place] [address], [place], [place] [address]      Labs: BNP (last 3 results) Recent Labs    04/07/19 0123  BNP 761.9*   Basic Metabolic Panel: Recent Labs  Lab 04/07/19 0123 04/07/19 0452 04/08/19 0437  NA 141 140 140  K 3.2* 3.6 4.0  CL 99 99 98  CO2 28 29 30   GLUCOSE 163* 128* 126*  BUN 22 21 29*  CREATININE 0.81 0.73 1.07  CALCIUM 8.8* 8.9 9.0  MG  --  2.0  --   PHOS  --  3.4  --    Liver Function Tests: Recent Labs  Lab 04/07/19 0123  AST 21  ALT 26  ALKPHOS 72  BILITOT 0.4  PROT 7.1  ALBUMIN 3.9   No results for input(s): LIPASE, AMYLASE in the last 168 hours. Recent Labs  Lab 04/03/19 1402 04/07/19 0123  AMMONIA 47* 40*   CBC: Recent Labs  Lab 04/07/19 0123 04/07/19 0452 04/08/19 0437  WBC 9.4 10.6* 9.6  NEUTROABS 7.0  --   --   HGB 16.1 15.8 15.7  HCT 51.5 51.5 52.5*  MCV 90.5 90.2 92.8  PLT 245 282 251   Cardiac  Enzymes: Recent Labs  Lab 04/07/19 0123  TROPONINI <0.03   BNP: Invalid input(s): POCBNP CBG: Recent Labs  Lab 04/07/19 0809 04/07/19 1136 04/07/19 1654 04/07/19 2309 04/08/19 0802  GLUCAP 110* 122* 126* 134* 120*   D-Dimer No results for input(s): DDIMER in the last 72 hours. Hgb A1c No results for input(s): HGBA1C in the last 72 hours. Lipid Profile No results for input(s): CHOL, HDL, LDLCALC, TRIG, CHOLHDL, LDLDIRECT in the last 72  hours. Thyroid function studies Recent Labs    04/07/19 0452  TSH 2.079   Anemia work up No results for input(s): VITAMINB12, FOLATE, FERRITIN, TIBC, IRON, RETICCTPCT in the last 72 hours. Urinalysis    Component Value Date/Time   COLORURINE YELLOW 04/07/2019 0123   APPEARANCEUR HAZY (A) 04/07/2019 0123   LABSPEC 1.021 04/07/2019 0123   PHURINE 5.0 04/07/2019 0123   GLUCOSEU NEGATIVE 04/07/2019 0123   HGBUR NEGATIVE 04/07/2019 0123   HGBUR large 10/23/2007 1455   BILIRUBINUR NEGATIVE 04/07/2019 0123   BILIRUBINUR N 03/02/2018 1655   KETONESUR NEGATIVE 04/07/2019 0123   PROTEINUR 30 (A) 04/07/2019 0123   UROBILINOGEN 0.2 03/02/2018 1655   UROBILINOGEN 1.0 11/19/2008 1858   NITRITE NEGATIVE 04/07/2019 0123   LEUKOCYTESUR NEGATIVE 04/07/2019 0123   Sepsis Labs Invalid input(s): PROCALCITONIN,  WBC,  LACTICIDVEN Microbiology Recent Results (from the past 240 hour(s))  Urine Culture     Status: Abnormal   Collection Time: 04/07/19  1:23 AM   Specimen: Urine, Random  Result Value Ref Range Status   Specimen Description   Final    URINE, RANDOM Performed at [facility], [place] [address] [place], [place] [address]    Special Requests   Final    NONE Performed at [facility], [place] [address] [place], [place] [address]    Culture MULTIPLE SPECIES PRESENT, SUGGEST RECOLLECTION (A)  Final   Report Status 04/08/2019 FINAL  Final  SARS Coronavirus 2 (CEPHEID - Performed in [place] hospital  lab), Hosp Order     Status: None   Collection Time: 04/07/19  1:23 AM   Specimen: Nasopharyngeal Swab  Result Value Ref Range Status   SARS Coronavirus 2 NEGATIVE NEGATIVE Final    Comment: (NOTE) If result is NEGATIVE SARS-CoV-2 target nucleic acids are NOT DETECTED. The SARS-CoV-2 RNA is generally detectable in upper and lower  respiratory specimens during the acute phase of infection. The lowest  concentration of SARS-CoV-2 viral copies this assay can detect is 250  copies / mL. A negative result does not preclude SARS-CoV-2 infection  and should not be used as the sole basis for treatment or other  patient management decisions.  A negative result may occur with  improper specimen collection / handling, submission of specimen other  than nasopharyngeal swab, presence of viral mutation(s) within the  areas targeted by this assay, and inadequate number of viral copies  (<250 copies / mL). A negative result must be combined with clinical  observations, patient history, and epidemiological information. If result is POSITIVE SARS-CoV-2 target nucleic acids are DETECTED. The SARS-CoV-2 RNA is generally detectable in upper and lower  respiratory specimens dur ing the acute phase of infection.  Positive  results are indicative of active infection with SARS-CoV-2.  Clinical  correlation with patient history and other diagnostic information is  necessary to determine patient infection status.  Positive results do  not rule out bacterial infection or co-infection with other viruses. If result is PRESUMPTIVE POSTIVE SARS-CoV-2 nucleic acids MAY BE PRESENT.   A presumptive positive result was obtained on the submitted specimen  and confirmed on repeat testing.  While 2019 novel coronavirus  (SARS-CoV-2) nucleic acids may be present in the submitted sample  additional confirmatory testing may be necessary for epidemiological  and / or clinical management purposes  to differentiate between   SARS-CoV-2 and other Sarbecovirus currently known to infect humans.  If clinically indicated additional testing with an alternate test  methodology [phone])  is advised. The SARS-CoV-2 RNA is generally  detectable in upper and lower respiratory sp ecimens during the acute  phase of infection. The expected result is Negative. Fact Sheet for Patients:  StrictlyIdeas.no Fact Sheet for Healthcare Providers: BankingDealers.co.za This test is not yet approved or cleared by the [place] FDA and has been authorized for detection and/or diagnosis of SARS-CoV-2 by FDA under an Emergency Use Authorization (EUA).  This EUA will remain in effect (meaning this test can be used) for the duration of the COVID-19 declaration under Section 564(b)(1) of the Act, 21 U.S.C. section 360bbb-3(b)(1), unless the authorization is terminated or revoked sooner. Performed at [facility], [place] [address], [place], [place] [address]      Time coordinating discharge: 45 minutes  SIGNED:   Tawni Millers, MD  [facility] 04/08/2019, 11:32 AM

## 2019-04-08 NOTE — TOC Initial Note (Signed)
Transition of Care [facility]) - Initial/Assessment Note    Patient Details  Name: [name] MRN: [id number] Date of Birth: October 21, 1945  Transition of [facility]) CM/SW Contact:    Erenest Rasher, RN Phone Number: 04/08/2019, 5:42 PM  Clinical Narrative:                 Spoke to dtr, Scherry Ran and pt with Unit RN. Will need HH at dc. Offered choice and pt/dtr agreeable to [facility] Unit RN will add contact info for [facility] to pt's dc instructions. Dtr states she lives near pt and will be able to assist in home as needed.   Expected Discharge Plan: [place] Barriers to Discharge: No Barriers Identified   Patient Goals and CMS Choice Patient states their goals for this hospitalization and ongoing recovery are:: be able to stay at home safely CMS Medicare.gov Compare Post Acute Care list provided to:: Patient Represenative (must comment)(Daughter, Scherry Ran) Choice offered to / list presented to : Adult Children, Patient  Expected Discharge Plan and Services Expected Discharge Plan: [place]   Discharge Planning Services: CM Consult Post Acute Care Choice: Home Health   Expected Discharge Date: 04/08/19                         HH Arranged: RN, Social Work [place] Agency: [place] Date [place]: 04/08/19 Time [place]: 1741    Prior Living Arrangements/Services   Lives with:: Spouse Patient language and need for interpreter reviewed:: Yes        Need for Family Participation in Patient Care: Yes (Comment) Care giver support system in place?: Yes (comment) Current home services: DME([facility]) Criminal Activity/Legal Involvement Pertinent to Current Situation/Hospitalization: No - Comment as needed  Activities of Daily Living Home Assistive Devices/Equipment: Cane (specify quad or straight) ADL Screening (condition at time of admission) Patient's cognitive ability adequate to  safely complete daily activities?: No Is the patient deaf or have difficulty hearing?: No Does the patient have difficulty seeing, even when wearing glasses/contacts?: No Does the patient have difficulty concentrating, remembering, or making decisions?: Yes Patient able to express need for assistance with ADLs?: No Does the patient have difficulty dressing or bathing?: Yes Independently performs ADLs?: No Does the patient have difficulty walking or climbing stairs?: Yes Weakness of Legs: Both(Knees) Weakness of Arms/Hands: None  Permission Sought/Granted Permission sought to share information with : Case Manager, PCP, Other (comment), Family Supports Permission granted to share information with : Yes, Verbal Permission Granted  Share Information with NAME: Scherry Ran  Permission granted to share info w AGENCY: [facility]  Permission granted to share info w Relationship: daughter  Permission granted to share info w Contact Information: [phone]  Emotional Assessment   Attitude/Demeanor/Rapport: Engaged Affect (typically observed): Accepting Orientation: : Oriented to Self, Oriented to Place, Oriented to  Time, Oriented to Situation   Psych Involvement: No (comment)  Admission diagnosis:  Delirium [R41.0] Patient Active Problem List   Diagnosis Date Noted  . Chronic systolic CHF (congestive heart failure) ([place]) 04/08/2019  . Altered mental status 04/07/2019  . 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 Anxiety, generalized 01/02/2017  . Type 2 diabetes, HbA1c goal < 7% (HCC) 11/25/2014  . Chronic systolic heart failure ([place]) 09/09/2013  . NICM (nonischemic cardiomyopathy) ([place]) 09/09/2013  . HLD (hyperlipidemia) 09/09/2013  . OSA (obstructive sleep apnea) 08/31/2013  . Acute systolic CHF (congestive  heart failure), NYHA class 2 ([place]) 08/27/2013  . LEG EDEMA 09/14/2009  . NON-HODGKIN'S LYMPHOMA 09/24/2008  . VERTIGO 09/24/2008  . ABDOMINAL MASS 10/26/2007  . Essential hypertension  10/23/2007  . NEPHROLITHIASIS 10/23/2007  . HEADACHE 06/07/2007   PCP:  [name], MD Pharmacy:   [id number] [address], [place]. [place] [address] Phone: [phone] Fax: [phone]  [id number] [place], [place]. AT [address] [place] [address] Phone: [phone] Fax: [phone]     Social Determinants of Health (SDOH) Interventions    Readmission Risk Interventions No flowsheet data found.

## 2019-04-08 NOTE — Plan of Care (Signed)

## 2019-04-08 NOTE — Progress Notes (Signed)
IV removed by pt with constant reminding of returning to his room. Pt up in the hall at times walking without a mask. Pt acknowledged understanding and willingly return to room. SRP, RN

## 2019-04-08 NOTE — Progress Notes (Signed)
  Echocardiogram 2D Echocardiogram has been performed.  [name] 04/08/2019, 3:31 PM

## 2019-04-09 ENCOUNTER — Telehealth: Payer: Self-pay | Admitting: *Deleted

## 2019-04-09 ENCOUNTER — Other Ambulatory Visit: Payer: Medicare Other

## 2019-04-09 NOTE — Telephone Encounter (Signed)
Copied from [place] [id number]. Topic: Appointment Scheduling - Scheduling Inquiry for Clinic >> Apr 09, 2019 11:10 AM Sheran Luz wrote: Patient would like to schedule hospital follow up appointment. Attempted to reach office x3.  Transition Care Management Follow-up Telephone Call   Date discharged? 04/08/2019   How have you been since you were released from the hospital? "Not bad"    Do you understand why you were in the hospital? Yes "I was seeing things"   Do you understand the discharge instructions? yes   Where were you discharged to? Home    Items Reviewed:  Medications reviewed: yes blood thinner and antibiotic was added to regimen   Allergies reviewed: yes  Dietary changes reviewed: yes  Referrals reviewed: yes Cardiologist (for A-fib), but was patient was told he needs a referral from Dr. [name]    Functional Questionnaire:   Activities of Daily Living (ADLs):   He states they are independent in the following: ambulation, bathing and hygiene, feeding, continence, grooming, toileting and dressing States they require assistance with the following: N/A    Any transportation issues/concerns?: no   Any patient concerns? Yes pt. Worried about psoriasis    Confirmed importance and date/time of follow-up visits scheduled yes   Provider Appointment booked with  Dr. [name] 04/10/2019 at [facility] (virtually)   Confirmed with patient if condition begins to worsen call PCP or go to the ER.  Patient was given the office number and encouraged to call back with question or concerns.  : yes

## 2019-04-10 ENCOUNTER — Other Ambulatory Visit: Payer: Self-pay

## 2019-04-10 ENCOUNTER — Encounter: Payer: Self-pay | Admitting: Family Medicine

## 2019-04-10 ENCOUNTER — Ambulatory Visit (INDEPENDENT_AMBULATORY_CARE_PROVIDER_SITE_OTHER): Payer: Medicare Other | Admitting: Family Medicine

## 2019-04-10 DIAGNOSIS — I4891 Unspecified atrial fibrillation: Secondary | ICD-10-CM

## 2019-04-10 DIAGNOSIS — I5022 Chronic systolic (congestive) heart failure: Secondary | ICD-10-CM

## 2019-04-10 DIAGNOSIS — R410 Disorientation, unspecified: Secondary | ICD-10-CM | POA: Diagnosis not present

## 2019-04-10 DIAGNOSIS — I480 Paroxysmal atrial fibrillation: Secondary | ICD-10-CM | POA: Insufficient documentation

## 2019-04-10 DIAGNOSIS — F411 Generalized anxiety disorder: Secondary | ICD-10-CM

## 2019-04-10 DIAGNOSIS — I10 Essential (primary) hypertension: Secondary | ICD-10-CM

## 2019-04-10 NOTE — Progress Notes (Signed)
Subjective:    Patient ID: [name], male    DOB: 02-04-1946, 73 y.o.   MRN: [id number]  HPI Virtual Visit via Video Note  I connected with the patient on 04/10/19 at  1:00 PM EDT by a video enabled telemedicine application and verified that I am speaking with the correct person using two identifiers.  Location patient: home Location provider:work or home office Persons participating in the virtual visit: patient, provider  I discussed the limitations of evaluation and management by telemedicine and the availability of in person appointments. The patient expressed understanding and agreed to proceed.   HPI: Here to follow up a hospital stay from 04-07-19 to 04-08-19 for increased confusion and hallucinations. He has also been complaining about SOB for several months. He was found to be in atrial fibrillation, and he was started on Eliquis 5 mg bid. Aspirin was stopped. Otherwise all his testing was unremarkable and no clear etiology for the mental status changes was found. He was sent home on Keflex for a presumed UTI, even though a UA was clear and a urine culture was non-diagnostic. We have been following him for memory loss and possible early dementia for the past year. Today he feels fine and has no concerns.    ROS: See pertinent positives and negatives per HPI.  Past Medical History:  Diagnosis Date  . Chronic systolic CHF (congestive heart failure) ([place])    a. Echo (08/28/13): Mild LVH, EF 35-40%, diffuse HK, moderate to severe LAE.  . Diabetes mellitus without complication ([place])   . Diverticulitis   . Headache(784.0)   . Hyperlipidemia   . Hypertension   . Lymphoma, non Hodgkin's    sees Dr. [name]   . Morbid obesity ([place])   . NICM (nonischemic cardiomyopathy) ([place])    a. R/L Heart cath 08/30/13: RA mean 8, RV 30/0, PA 27/12, mean PCWP 9, CO 5.67, CI 1.98; normal coronary arteries  . Sleep apnea     Past Surgical History:  Procedure Laterality Date  . HERNIA  REPAIR     umblical  . incarcerated hernia     vental 11/19/08 Dr. [name]  . LEFT AND RIGHT HEART CATHETERIZATION WITH CORONARY ANGIOGRAM N/A 08/30/2013   Procedure: LEFT AND RIGHT HEART CATHETERIZATION WITH CORONARY ANGIOGRAM;  Surgeon: [name], MD;  Location: [facility] CATH LAB;  Service: Cardiovascular;  Laterality: N/A;    Family History  Problem Relation Age of Onset  . Heart failure Mother   . Hypertension Mother   . Cancer Father        colon  . Hypertension Father   . Diabetes Father   . Stroke Maternal Grandmother   . Diabetes Paternal Grandfather   . Cancer Maternal Grandfather      Current Outpatient Medications:  .  amLODipine (NORVASC) 5 MG tablet, Take 1 tablet (5 mg total) by mouth daily., Disp: 90 tablet, Rfl: 3 .  apixaban (ELIQUIS) 5 MG TABS tablet, Take 1 tablet (5 mg total) by mouth 2 (two) times daily for 30 days., Disp: 60 tablet, Rfl: 0 .  atorvastatin (LIPITOR) 20 MG tablet, Take 1 tablet (20 mg total) by mouth daily., Disp: 90 tablet, Rfl: 3 .  cephALEXin (KEFLEX) 500 MG capsule, Take 1 capsule (500 mg total) by mouth every 6 (six) hours for 5 days., Disp: 20 capsule, Rfl: 0 .  clobetasol (TEMOVATE) 0.05 % external solution, Apply topically 2 (two) times daily. (Patient taking differently: Apply 1 application topically 2 (two)  times daily. ), Disp: 50 mL, Rfl: 5 .  FLUoxetine (PROZAC) 40 MG capsule, Take 1 capsule (40 mg total) by mouth daily., Disp: 90 capsule, Rfl: 3 .  furosemide (LASIX) 40 MG tablet, TAKE 1 TABLET BY MOUTH EVERY DAY, Disp: 90 tablet, Rfl: 3 .  metFORMIN (GLUCOPHAGE) 500 MG tablet, TAKE 1 TABLET BY MOUTH TWICE DAILY WITH A MEAL (Patient taking differently: Take 500 mg by mouth 2 (two) times daily with a meal. TAKE 1 TABLET BY MOUTH TWICE DAILY WITH A MEAL), Disp: 180 tablet, Rfl: 3 .  metoprolol (TOPROL-XL) 200 MG 24 hr tablet, TAKE 1 TABLET BY MOUTH EVERY DAY, Disp: 90 tablet, Rfl: 3 .  Multiple Vitamins-Minerals (MULTIVITAMIN MEN 50+)  TABS, Take 1 tablet by mouth daily., Disp: , Rfl:  .  olmesartan (BENICAR) 40 MG tablet, Take 1 tablet (40 mg total) by mouth daily., Disp: 90 tablet, Rfl: 3 .  polyethylene glycol (MIRALAX / GLYCOLAX) packet, Take 17 g by mouth daily as needed for moderate constipation., Disp: , Rfl:  .  spironolactone (ALDACTONE) 25 MG tablet, TAKE 1 TABLET BY MOUTH EVERY DAY, Disp: 90 tablet, Rfl: 3  EXAM:  VITALS per patient if applicable:  GENERAL: alert, oriented, appears well and in no acute distress  HEENT: atraumatic, conjunttiva clear, no obvious abnormalities on inspection of external nose and ears  NECK: normal movements of the head and neck  LUNGS: on inspection no signs of respiratory distress, breathing rate appears normal, no obvious gross SOB, gasping or wheezing  CV: no obvious cyanosis  MS: moves all visible extremities without noticeable abnormality  PSYCH/NEURO: pleasant and cooperative, no obvious depression or anxiety, speech and thought processing grossly intact  ASSESSMENT AND PLAN: New onset atrial fibrillation with a controlled ventricular response and stable BP. We will arrange for him to see his cardiologist, Dr. Johnsie Cancel. He will remain on Eliquis. His HTN and CHF are stable. It seems likely that his memory issues, confusion, and hallucinations are related to each other, and I think these are the result of a dementia process rather than a metabolic encephalopathy. He is scheduled to see his neurologist, Dr. Delice Lesch, on 04-26-19 to discuss this.  Alysia Penna, MD  Discussed the following assessment and plan:  No diagnosis found.     I discussed the assessment and treatment plan with the patient. The patient was provided an opportunity to ask questions and all were answered. The patient agreed with the plan and demonstrated an understanding of the instructions.   The patient was advised to call back or seek an in-person evaluation if the symptoms worsen or if the condition  fails to improve as anticipated.     Review of Systems     Objective:   Physical Exam        Assessment & Plan:

## 2019-04-11 ENCOUNTER — Telehealth: Payer: Self-pay | Admitting: Family Medicine

## 2019-04-11 DIAGNOSIS — G4733 Obstructive sleep apnea (adult) (pediatric): Secondary | ICD-10-CM | POA: Diagnosis not present

## 2019-04-11 DIAGNOSIS — Z6841 Body Mass Index (BMI) 40.0 and over, adult: Secondary | ICD-10-CM | POA: Diagnosis not present

## 2019-04-11 DIAGNOSIS — I110 Hypertensive heart disease with heart failure: Secondary | ICD-10-CM | POA: Diagnosis not present

## 2019-04-11 DIAGNOSIS — N390 Urinary tract infection, site not specified: Secondary | ICD-10-CM | POA: Diagnosis not present

## 2019-04-11 DIAGNOSIS — E119 Type 2 diabetes mellitus without complications: Secondary | ICD-10-CM | POA: Diagnosis not present

## 2019-04-11 DIAGNOSIS — I5022 Chronic systolic (congestive) heart failure: Secondary | ICD-10-CM | POA: Diagnosis not present

## 2019-04-11 DIAGNOSIS — Z9861 Coronary angioplasty status: Secondary | ICD-10-CM | POA: Diagnosis not present

## 2019-04-11 DIAGNOSIS — C8590 Non-Hodgkin lymphoma, unspecified, unspecified site: Secondary | ICD-10-CM | POA: Diagnosis not present

## 2019-04-11 DIAGNOSIS — F329 Major depressive disorder, single episode, unspecified: Secondary | ICD-10-CM | POA: Diagnosis not present

## 2019-04-11 DIAGNOSIS — I429 Cardiomyopathy, unspecified: Secondary | ICD-10-CM | POA: Diagnosis not present

## 2019-04-11 DIAGNOSIS — I4891 Unspecified atrial fibrillation: Secondary | ICD-10-CM | POA: Diagnosis not present

## 2019-04-11 DIAGNOSIS — Z7984 Long term (current) use of oral hypoglycemic drugs: Secondary | ICD-10-CM | POA: Diagnosis not present

## 2019-04-11 DIAGNOSIS — K5792 Diverticulitis of intestine, part unspecified, without perforation or abscess without bleeding: Secondary | ICD-10-CM | POA: Diagnosis not present

## 2019-04-11 DIAGNOSIS — Z87891 Personal history of nicotine dependence: Secondary | ICD-10-CM | POA: Diagnosis not present

## 2019-04-11 DIAGNOSIS — Z7901 Long term (current) use of anticoagulants: Secondary | ICD-10-CM | POA: Diagnosis not present

## 2019-04-11 DIAGNOSIS — R413 Other amnesia: Secondary | ICD-10-CM | POA: Diagnosis not present

## 2019-04-11 DIAGNOSIS — R51 Headache: Secondary | ICD-10-CM | POA: Diagnosis not present

## 2019-04-11 DIAGNOSIS — E876 Hypokalemia: Secondary | ICD-10-CM | POA: Diagnosis not present

## 2019-04-11 DIAGNOSIS — E785 Hyperlipidemia, unspecified: Secondary | ICD-10-CM | POA: Diagnosis not present

## 2019-04-11 NOTE — Telephone Encounter (Signed)
Home Health Verbal Orders - Caller/Agency: Den[facility]/ [facility] Number: [phone] [id number] OT/PT/Skilled Nursing/Social Work/Speech Therapy: Nursing, OT, Social work Frequency:  Langley Gauss is also requesting to know when pt was last tested for A1C and the results. Please advise.

## 2019-04-12 ENCOUNTER — Encounter: Payer: Self-pay | Admitting: Family Medicine

## 2019-04-12 ENCOUNTER — Telehealth: Payer: Self-pay | Admitting: Family Medicine

## 2019-04-12 NOTE — Telephone Encounter (Signed)
I put in the referral, so he can call Cardiology if he wants to

## 2019-04-12 NOTE — Telephone Encounter (Signed)
Caller/Agency: Janice Norrie with [facility] Number: [phone] Requesting OT/PT/Skilled Nursing/Social Work/Speech Therapy: social work  Frequency: need to reschedule missed evaluation visit this week and reschedule it for next week when pt daughter is present

## 2019-04-12 NOTE — Telephone Encounter (Signed)
Please okay all these orders. The last A1c was 7.2 in March 2018

## 2019-04-12 NOTE — Telephone Encounter (Signed)
Dr. Fry please advise. Thanks  

## 2019-04-12 NOTE — Telephone Encounter (Signed)
Called number listed. No answer. LVM for return call. CRM created

## 2019-04-15 ENCOUNTER — Telehealth: Payer: Self-pay | Admitting: Cardiovascular Disease

## 2019-04-15 NOTE — Telephone Encounter (Signed)
No message needed °

## 2019-04-17 NOTE — Telephone Encounter (Signed)
Called and spoke with Jackelyn Poling with [facility] and she is aware of [place] for [facility] for social work.  She will get this taken care of.

## 2019-04-18 DIAGNOSIS — E119 Type 2 diabetes mellitus without complications: Secondary | ICD-10-CM | POA: Diagnosis not present

## 2019-04-18 DIAGNOSIS — I110 Hypertensive heart disease with heart failure: Secondary | ICD-10-CM | POA: Diagnosis not present

## 2019-04-18 DIAGNOSIS — R413 Other amnesia: Secondary | ICD-10-CM | POA: Diagnosis not present

## 2019-04-18 DIAGNOSIS — I5022 Chronic systolic (congestive) heart failure: Secondary | ICD-10-CM | POA: Diagnosis not present

## 2019-04-18 DIAGNOSIS — N390 Urinary tract infection, site not specified: Secondary | ICD-10-CM | POA: Diagnosis not present

## 2019-04-18 DIAGNOSIS — I4891 Unspecified atrial fibrillation: Secondary | ICD-10-CM | POA: Diagnosis not present

## 2019-04-19 DIAGNOSIS — I110 Hypertensive heart disease with heart failure: Secondary | ICD-10-CM | POA: Diagnosis not present

## 2019-04-19 DIAGNOSIS — N390 Urinary tract infection, site not specified: Secondary | ICD-10-CM | POA: Diagnosis not present

## 2019-04-19 DIAGNOSIS — I4891 Unspecified atrial fibrillation: Secondary | ICD-10-CM | POA: Diagnosis not present

## 2019-04-19 DIAGNOSIS — E119 Type 2 diabetes mellitus without complications: Secondary | ICD-10-CM | POA: Diagnosis not present

## 2019-04-19 DIAGNOSIS — I5022 Chronic systolic (congestive) heart failure: Secondary | ICD-10-CM | POA: Diagnosis not present

## 2019-04-19 DIAGNOSIS — R413 Other amnesia: Secondary | ICD-10-CM | POA: Diagnosis not present

## 2019-04-22 ENCOUNTER — Ambulatory Visit: Payer: Self-pay | Admitting: Family Medicine

## 2019-04-22 NOTE — Telephone Encounter (Signed)
Pt. Reports he fell yesterday on his right knee. Has swelling and bruising. Reports bruising goes 7 inches above the knee. Can walk on it, but it is painful. Warm transfer to [facility] in the practice for a visit.  Answer Assessment - Initial Assessment Questions 1. MECHANISM: "How did the injury happen?" (e.g., twisting injury, direct blow)      Fell yesterday 2. ONSET: "When did the injury happen?" (Minutes or hours ago)      Yesterday 3. LOCATION: "Where is the injury located?"      Right knee 4. APPEARANCE of INJURY: "What does the injury look like?"  (e.g., deformity of leg)     Bruised, swollen 5. SEVERITY: "Can you put weight on that leg?" "Can you walk?"      Can walk 6. SIZE: For cuts, bruises, or swelling, ask: "How large is it?" (e.g., inches or centimeters)      Abrasion 7. PAIN: "Is there pain?" If so, ask: "How bad is the pain?"  (Scale 1-10; or mild, moderate, severe)     Moderate 8. TETANUS: For any breaks in the skin, ask: "When was the last tetanus booster?"     Unsure 9. OTHER SYMPTOMS: "Do you have any other symptoms?"      No 10. PREGNANCY: "Is there any chance you are pregnant?" "When was your last menstrual period?"       N/A  Protocols used: LEG INJURY-A-AH

## 2019-04-22 NOTE — Telephone Encounter (Signed)
Pt scheduled for office visit

## 2019-04-23 ENCOUNTER — Encounter: Payer: Self-pay | Admitting: Family Medicine

## 2019-04-23 ENCOUNTER — Other Ambulatory Visit: Payer: Self-pay

## 2019-04-23 ENCOUNTER — Ambulatory Visit (INDEPENDENT_AMBULATORY_CARE_PROVIDER_SITE_OTHER): Payer: Medicare Other | Admitting: Family Medicine

## 2019-04-23 ENCOUNTER — Ambulatory Visit (INDEPENDENT_AMBULATORY_CARE_PROVIDER_SITE_OTHER): Payer: Medicare Other

## 2019-04-23 VITALS — BP 100/58 | HR 108 | Temp 97.6°F

## 2019-04-23 DIAGNOSIS — S8001XA Contusion of right knee, initial encounter: Secondary | ICD-10-CM | POA: Diagnosis not present

## 2019-04-23 DIAGNOSIS — S8991XA Unspecified injury of right lower leg, initial encounter: Secondary | ICD-10-CM | POA: Diagnosis not present

## 2019-04-23 MED ORDER — TRAMADOL HCL 50 MG PO TABS: 100.0000 mg | ORAL_TABLET | Freq: Four times a day (QID) | ORAL | 1 refills | Status: AC | PRN

## 2019-04-23 NOTE — Progress Notes (Signed)
   Subjective:    Patient ID: [name], male    DOB: 1946-05-03, 73 y.o.   MRN: [id number]  HPI Here with his wife for an injury to the right knee that occurred at home 3 days ago. He was in the back yard and he apparently tripped over a stepping stone. He does not remember falling but he seems to have struck the knee. No head trauma. He called to his wife to help him get up. He is on Eliquis for recently diagnosed atrial fibrillation, and consequently he has had a tremendous amount of swelling and bruising in the right leg. He has pain in the knee, especially when trying to walk on it. He has been taking Tylenol.    Review of Systems  Constitutional: Negative.   Respiratory: Negative.   Cardiovascular: Positive for leg swelling. Negative for chest pain and palpitations.  Musculoskeletal: Positive for arthralgias and joint swelling.  Neurological: Negative.        Objective:   Physical Exam Constitutional:      Appearance: Normal appearance.  Cardiovascular:     Rate and Rhythm: Normal rate. Rhythm irregular.     Pulses: Normal pulses.     Heart sounds: Normal heart sounds.  Pulmonary:     Effort: Pulmonary effort is normal.     Breath sounds: Normal breath sounds.  Musculoskeletal:     Comments: 3+ edema in the right leg and 2+ in the left leg. There are a lot of ecchymoses around the right knee and even in the thigh and lower leg. The knee is quite tender, especially in both joint spaces. ROM is quite limited by swelling and pain. XRays of the right knee are negative for fractures.   Neurological:     Mental Status: He is alert.           Assessment & Plan:  Knee contusion. He will stay off the leg as much as possible, apply ice packs 5-6 times a day, use Tramadol for pain. Once the swelling goes down we can reassess next week. He does have a walker he can use at home.  Alysia Penna, MD

## 2019-04-25 ENCOUNTER — Other Ambulatory Visit: Payer: Self-pay

## 2019-04-25 ENCOUNTER — Telehealth: Payer: Self-pay | Admitting: Family Medicine

## 2019-04-25 DIAGNOSIS — N390 Urinary tract infection, site not specified: Secondary | ICD-10-CM | POA: Diagnosis not present

## 2019-04-25 DIAGNOSIS — I110 Hypertensive heart disease with heart failure: Secondary | ICD-10-CM | POA: Diagnosis not present

## 2019-04-25 DIAGNOSIS — R413 Other amnesia: Secondary | ICD-10-CM | POA: Diagnosis not present

## 2019-04-25 DIAGNOSIS — I4891 Unspecified atrial fibrillation: Secondary | ICD-10-CM | POA: Diagnosis not present

## 2019-04-25 DIAGNOSIS — I5022 Chronic systolic (congestive) heart failure: Secondary | ICD-10-CM | POA: Diagnosis not present

## 2019-04-25 DIAGNOSIS — E119 Type 2 diabetes mellitus without complications: Secondary | ICD-10-CM | POA: Diagnosis not present

## 2019-04-25 NOTE — Telephone Encounter (Signed)
Langley Gauss, RN with [facility], calling to inquire if Dr. [name] felt it would be appropriate to order PT for patient due to his recent fall. Langley Gauss is requesting call back from PCP or CMA to discuss further. Please advise.   Langley Gauss # [phone]

## 2019-04-26 ENCOUNTER — Telehealth (INDEPENDENT_AMBULATORY_CARE_PROVIDER_SITE_OTHER): Payer: Medicare Other | Admitting: Neurology

## 2019-04-26 ENCOUNTER — Other Ambulatory Visit: Payer: Self-pay

## 2019-04-26 ENCOUNTER — Encounter: Payer: Self-pay | Admitting: Neurology

## 2019-04-26 VITALS — Ht 74.0 in | Wt >= 6400 oz

## 2019-04-26 DIAGNOSIS — F0391 Unspecified dementia with behavioral disturbance: Secondary | ICD-10-CM

## 2019-04-26 DIAGNOSIS — F03B18 Unspecified dementia, moderate, with other behavioral disturbance: Secondary | ICD-10-CM

## 2019-04-26 MED ORDER — DONEPEZIL HCL 10 MG PO TABS: ORAL_TABLET | ORAL | 11 refills | Status: DC

## 2019-04-26 NOTE — Telephone Encounter (Signed)
Dr. Fry please advise. Thanks  

## 2019-04-26 NOTE — Progress Notes (Signed)
Virtual Visit via Video Note The purpose of this virtual visit is to provide medical care while limiting exposure to the novel coronavirus.    Consent was obtained for video visit:  Yes.   Answered questions that patient had about telehealth interaction:  Yes.   I discussed the limitations, risks, security and privacy concerns of performing an evaluation and management service by telemedicine. I also discussed with the patient that there may be a patient responsible charge related to this service. The patient expressed understanding and agreed to proceed.  Pt location: Home Physician Location: office Name of referring provider:  [name], MD I connected with [name] at patients initiation/request on 04/26/2019 at  1:00 PM EDT by video enabled telemedicine application and verified that I am speaking with the correct person using two identifiers. Pt MRN:  [id number] Pt DOB:  [birth year] Video Participants:  [name];  Wayne Sever (spouse); Aneta Mins (daughter)   History of Present Illness:  This is a pleasant 73 year old right-handed man with a history of hypertension, hyperlipidemia, diabetes,CHF, NICM, atrial fibrillation, non-Hodgkin's lymphoma, presenting for evaluation of memory loss and hallucinations. He states his memory is poor. His wife and daughter report that he has always had memory issues, but the past couple of years they started to notice a different amount of memory loss. In the past month, he has had a steep decline in short-term memory with noticeable confusion, as well as visual hallucinations. He stopped driving 2 years ago because he did not feel secure. His family started managing his medication over the past month, he would mess up his pillbox. He used to pay the bills but it made him too anxious and frustrated, he was not paying things that needed to be paid, so his wife took over the past year. His family started noticing sporadic visual  hallucinations over the past year, increasing in frequency and almost constant the past month. He would see people and animals inside and outside the house. He does not think they are real but does think they are some kind of image. No auditory component. They have not been scary for him. His wife has noticed a little paranoia, but they report he has always had anxiety. Sleep has always been on and off through the years since he used to work night shift. The hallucinations do not wake him up from sleep, no wandering behavior. His father had trouble with memory in his 73s. No history of significant head injuries or alcohol use.  He has occasional headaches from sinus issues or aggravation. He denies any dizziness, diplopia, dysarthria/dysphagia, neck/back pain, focal numbness/tingling/weakness, bowel/bladder dysfunction, anosmia, or tremors. Mood is "okay for an old guy." He fell last Sunday and hurt his right knee.   I personally reviewed head CT without contrast done 03/2019, no acute changes. There was mild diffuse atrophy, mild chronic microvascular disease.  Laboratory Data: Lab Results  Component Value Date   TSH 2.079 04/07/2019    PAST MEDICAL HISTORY: Past Medical History:  Diagnosis Date  . Chronic systolic CHF (congestive heart failure) ([place])    a. Echo (08/28/13): Mild LVH, EF 35-40%, diffuse HK, moderate to severe LAE.  . Diabetes mellitus without complication ([place])   . Diverticulitis   . Headache(784.0)   . Hyperlipidemia   . Hypertension   . Lymphoma, non Hodgkin's    sees Dr. [name]   . Morbid obesity ([place])   . NICM (nonischemic cardiomyopathy) ([place])  a. R/L Heart cath 08/30/13: RA mean 8, RV 30/0, PA 27/12, mean PCWP 9, CO 5.67, CI 1.98; normal coronary arteries  . Sleep apnea     PAST SURGICAL HISTORY: Past Surgical History:  Procedure Laterality Date  . HERNIA REPAIR     umblical  . incarcerated hernia     vental 11/19/08 Dr. [name]  . LEFT AND RIGHT  HEART CATHETERIZATION WITH CORONARY ANGIOGRAM N/A 08/30/2013   Procedure: LEFT AND RIGHT HEART CATHETERIZATION WITH CORONARY ANGIOGRAM;  Surgeon: [name], MD;  Location: [facility] CATH LAB;  Service: Cardiovascular;  Laterality: N/A;    MEDICATIONS: Current Outpatient Medications on File Prior to Visit  Medication Sig Dispense Refill  . amLODipine (NORVASC) 5 MG tablet Take 1 tablet (5 mg total) by mouth daily. 90 tablet 3  . apixaban (ELIQUIS) 5 MG TABS tablet Take 1 tablet (5 mg total) by mouth 2 (two) times daily for 30 days. 60 tablet 0  . atorvastatin (LIPITOR) 20 MG tablet Take 1 tablet (20 mg total) by mouth daily. 90 tablet 3  . clobetasol (TEMOVATE) 0.05 % external solution Apply topically 2 (two) times daily. (Patient taking differently: Apply 1 application topically daily. ) 50 mL 5  . FLUoxetine (PROZAC) 40 MG capsule Take 1 capsule (40 mg total) by mouth daily. 90 capsule 3  . furosemide (LASIX) 40 MG tablet TAKE 1 TABLET BY MOUTH EVERY DAY 90 tablet 3  . metFORMIN (GLUCOPHAGE) 500 MG tablet TAKE 1 TABLET BY MOUTH TWICE DAILY WITH A MEAL (Patient taking differently: Take 500 mg by mouth 2 (two) times daily with a meal. TAKE 1 TABLET BY MOUTH TWICE DAILY WITH A MEAL) 180 tablet 3  . metoprolol (TOPROL-XL) 200 MG 24 hr tablet TAKE 1 TABLET BY MOUTH EVERY DAY 90 tablet 3  . Multiple Vitamins-Minerals (MULTIVITAMIN MEN 50+) TABS Take 1 tablet by mouth daily.    Marland Kitchen olmesartan (BENICAR) 40 MG tablet Take 1 tablet (40 mg total) by mouth daily. 90 tablet 3  . polyethylene glycol (MIRALAX / GLYCOLAX) packet Take 17 g by mouth daily as needed for moderate constipation.    Marland Kitchen spironolactone (ALDACTONE) 25 MG tablet TAKE 1 TABLET BY MOUTH EVERY DAY 90 tablet 3  . traMADol (ULTRAM) 50 MG tablet Take 2 tablets (100 mg total) by mouth every 6 (six) hours as needed for up to 5 days for moderate pain. 60 tablet 1   No current facility-administered medications on file prior to visit.     ALLERGIES:  No Known Allergies  FAMILY HISTORY: Family History  Problem Relation Age of Onset  . Heart failure Mother   . Hypertension Mother   . Cancer Father        colon  . Hypertension Father   . Diabetes Father   . Stroke Maternal Grandmother   . Diabetes Paternal Grandfather   . Cancer Maternal Grandfather       Current Outpatient Medications on File Prior to Visit  Medication Sig Dispense Refill  . amLODipine (NORVASC) 5 MG tablet Take 1 tablet (5 mg total) by mouth daily. 90 tablet 3  . apixaban (ELIQUIS) 5 MG TABS tablet Take 1 tablet (5 mg total) by mouth 2 (two) times daily for 30 days. 60 tablet 0  . atorvastatin (LIPITOR) 20 MG tablet Take 1 tablet (20 mg total) by mouth daily. 90 tablet 3  . clobetasol (TEMOVATE) 0.05 % external solution Apply topically 2 (two) times daily. (Patient taking differently: Apply 1 application topically  daily. ) 50 mL 5  . FLUoxetine (PROZAC) 40 MG capsule Take 1 capsule (40 mg total) by mouth daily. 90 capsule 3  . furosemide (LASIX) 40 MG tablet TAKE 1 TABLET BY MOUTH EVERY DAY 90 tablet 3  . metFORMIN (GLUCOPHAGE) 500 MG tablet TAKE 1 TABLET BY MOUTH TWICE DAILY WITH A MEAL (Patient taking differently: Take 500 mg by mouth 2 (two) times daily with a meal. TAKE 1 TABLET BY MOUTH TWICE DAILY WITH A MEAL) 180 tablet 3  . metoprolol (TOPROL-XL) 200 MG 24 hr tablet TAKE 1 TABLET BY MOUTH EVERY DAY 90 tablet 3  . Multiple Vitamins-Minerals (MULTIVITAMIN MEN 50+) TABS Take 1 tablet by mouth daily.    Marland Kitchen olmesartan (BENICAR) 40 MG tablet Take 1 tablet (40 mg total) by mouth daily. 90 tablet 3  . polyethylene glycol (MIRALAX / GLYCOLAX) packet Take 17 g by mouth daily as needed for moderate constipation.    Marland Kitchen spironolactone (ALDACTONE) 25 MG tablet TAKE 1 TABLET BY MOUTH EVERY DAY 90 tablet 3  . traMADol (ULTRAM) 50 MG tablet Take 2 tablets (100 mg total) by mouth every 6 (six) hours as needed for up to 5 days for moderate pain. 60 tablet 1   No current  facility-administered medications on file prior to visit.      Observations/Objective:   Vitals:   04/26/19 0807  Weight: (!) 400 lb (181.4 kg)  Height: 6\' 2"  (1.88 m)   GEN:  The patient appears stated age and is in NAD.  Neurological examination: Patient is awake, alert, oriented x 2. States it is April 2019. No aphasia or dysarthria. Reduced fluency, able to follow commands. Remote and recent memory impaired. Able to name and repeat. Cranial nerves: Extraocular movements intact with no nystagmus. No facial asymmetry. Motor: moves all extremities symmetrically, at least anti-gravity x 4. No incoordination on finger to nose testing. Gait: slightly wide-based, no ataxia. Negative Romberg test. Good finger taps.  Assessment and Plan:   This is a pleasant 73 year old right-handed man with a history of hypertension, hyperlipidemia, diabetes,CHF, NICM, atrial fibrillation, non-Hodgkin's lymphoma, presenting for evaluation of memory loss and hallucinations. MOCA score today 12/30. 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 Would check B12 level as well. With history of lymphoma, would proceed with MRI brain with and without contrast to assess for underlying structural abnormality. Discussed starting Donepezil, this may help with hallucinations as well, side effects and expectations from the medication were discussed. Start 5mg  daily for 2 weeks, then increase to 10mg  daily. Continue close supervision. He does not drive. Follow-up in 6 months, they know to call for any changes.    Follow Up Instructions:   -I discussed the assessment and treatment plan with the patient. The patient was provided an opportunity to ask questions and all were answered. The patient agreed with the plan and demonstrated an understanding of the instructions.   The patient was advised to call back or seek an in-person evaluation if the symptoms worsen or if the condition  fails to improve as anticipated.    Cameron Sprang, MD

## 2019-04-29 ENCOUNTER — Encounter: Payer: Self-pay | Admitting: Family Medicine

## 2019-04-29 DIAGNOSIS — I5022 Chronic systolic (congestive) heart failure: Secondary | ICD-10-CM | POA: Diagnosis not present

## 2019-04-29 DIAGNOSIS — E119 Type 2 diabetes mellitus without complications: Secondary | ICD-10-CM | POA: Diagnosis not present

## 2019-04-29 DIAGNOSIS — I110 Hypertensive heart disease with heart failure: Secondary | ICD-10-CM | POA: Diagnosis not present

## 2019-04-29 DIAGNOSIS — I4891 Unspecified atrial fibrillation: Secondary | ICD-10-CM | POA: Diagnosis not present

## 2019-04-29 DIAGNOSIS — R413 Other amnesia: Secondary | ICD-10-CM | POA: Diagnosis not present

## 2019-04-29 DIAGNOSIS — N390 Urinary tract infection, site not specified: Secondary | ICD-10-CM | POA: Diagnosis not present

## 2019-04-30 ENCOUNTER — Other Ambulatory Visit: Payer: Self-pay | Admitting: Neurology

## 2019-04-30 ENCOUNTER — Telehealth: Payer: Self-pay | Admitting: Neurology

## 2019-04-30 MED ORDER — MEMANTINE HCL 10 MG PO TABS: ORAL_TABLET | ORAL | 11 refills | Status: DC

## 2019-04-30 NOTE — Telephone Encounter (Signed)
Verbal order given for PT to [facility]. Langley Gauss verbalized she will place PT evaluation.

## 2019-04-30 NOTE — Telephone Encounter (Signed)
[name] from [facility] is calling to let you know about a medication reaction to the Aricept. He said that he reacted horribly to the first dose and then completely stopped taking it. I didn't get type of reaction before he just hung up. Thanks!

## 2019-04-30 NOTE — Telephone Encounter (Signed)
Please order PT to help after recent fall

## 2019-05-01 DIAGNOSIS — R51 Headache: Secondary | ICD-10-CM

## 2019-05-01 DIAGNOSIS — Z87891 Personal history of nicotine dependence: Secondary | ICD-10-CM

## 2019-05-01 DIAGNOSIS — K5792 Diverticulitis of intestine, part unspecified, without perforation or abscess without bleeding: Secondary | ICD-10-CM

## 2019-05-01 DIAGNOSIS — F329 Major depressive disorder, single episode, unspecified: Secondary | ICD-10-CM | POA: Diagnosis not present

## 2019-05-01 DIAGNOSIS — Z6841 Body Mass Index (BMI) 40.0 and over, adult: Secondary | ICD-10-CM

## 2019-05-01 DIAGNOSIS — I4891 Unspecified atrial fibrillation: Secondary | ICD-10-CM

## 2019-05-01 DIAGNOSIS — R413 Other amnesia: Secondary | ICD-10-CM

## 2019-05-01 DIAGNOSIS — Z9861 Coronary angioplasty status: Secondary | ICD-10-CM

## 2019-05-01 DIAGNOSIS — E119 Type 2 diabetes mellitus without complications: Secondary | ICD-10-CM

## 2019-05-01 DIAGNOSIS — E785 Hyperlipidemia, unspecified: Secondary | ICD-10-CM | POA: Diagnosis not present

## 2019-05-01 DIAGNOSIS — C8590 Non-Hodgkin lymphoma, unspecified, unspecified site: Secondary | ICD-10-CM | POA: Diagnosis not present

## 2019-05-01 DIAGNOSIS — N390 Urinary tract infection, site not specified: Secondary | ICD-10-CM

## 2019-05-01 DIAGNOSIS — Z7984 Long term (current) use of oral hypoglycemic drugs: Secondary | ICD-10-CM

## 2019-05-01 DIAGNOSIS — I429 Cardiomyopathy, unspecified: Secondary | ICD-10-CM

## 2019-05-01 DIAGNOSIS — I110 Hypertensive heart disease with heart failure: Secondary | ICD-10-CM

## 2019-05-01 DIAGNOSIS — Z7901 Long term (current) use of anticoagulants: Secondary | ICD-10-CM

## 2019-05-01 DIAGNOSIS — I5022 Chronic systolic (congestive) heart failure: Secondary | ICD-10-CM

## 2019-05-01 DIAGNOSIS — E876 Hypokalemia: Secondary | ICD-10-CM | POA: Diagnosis not present

## 2019-05-01 DIAGNOSIS — G4733 Obstructive sleep apnea (adult) (pediatric): Secondary | ICD-10-CM | POA: Diagnosis not present

## 2019-05-01 NOTE — Telephone Encounter (Signed)
Pt last took Aricept months ago per wife.  Pt has not started Namenda. Wife just wants to make sure pt will not have a reaction. She wants daughter to be at home with them when pt decides to take it for the first time.

## 2019-05-02 ENCOUNTER — Telehealth: Payer: Self-pay | Admitting: Cardiology

## 2019-05-02 NOTE — Telephone Encounter (Signed)
I called [name] to confirm his appt with Dr [name] on 05-03-19.       1. Confirm consent - "In the setting of the current Covid19 crisis, you are scheduled for a (phone or video) visit with your provider on (date) at (time).  Just as we do with many in-office visits, in order for you to participate in this visit, we must obtain consent.  If you'd like, I can send this to your mychart (if signed up) or email for you to review.  Otherwise, I can obtain your verbal consent now.  All virtual visits are billed to your insurance company just like a normal visit would be.  By agreeing to a virtual visit, we'd like you to understand that the technology does not allow for your provider to perform an examination, and thus may limit your provider's ability to fully assess your condition. If your provider identifies any concerns that need to be evaluated in person, we will make arrangements to do so.  Finally, though the technology is pretty good, we cannot assure that it will always work on either your or our end, and in the setting of a video visit, we may have to convert it to a phone-only visit.  In either situation, we cannot ensure that we have a secure connection.  Are you willing to proceed?" STAFF: Did the patient verbally acknowledge consent to telehealth visit? Document YES/NO here: Yes       FULL LENGTH CONSENT FOR TELE-HEALTH VISIT   I hereby voluntarily request, consent and authorize [facility] and its employed or contracted physicians, physician assistants, nurse practitioners or other licensed health care professionals (the Practitioner), to provide me with telemedicine health care services (the Services") as deemed necessary by the treating Practitioner. I acknowledge and consent to receive the Services by the Practitioner via telemedicine. I understand that the telemedicine visit will involve communicating with the Practitioner through live audiovisual communication technology and the  disclosure of certain medical information by electronic transmission. I acknowledge that I have been given the opportunity to request an in-person assessment or other available alternative prior to the telemedicine visit and am voluntarily participating in the telemedicine visit.  I understand that I have the right to withhold or withdraw my consent to the use of telemedicine in the course of my care at any time, without affecting my right to future care or treatment, and that the Practitioner or I may terminate the telemedicine visit at any time. I understand that I have the right to inspect all information obtained and/or recorded in the course of the telemedicine visit and may receive copies of available information for a reasonable fee.  I understand that some of the potential risks of receiving the Services via telemedicine include:   Delay or interruption in medical evaluation due to technological equipment failure or disruption;  Information transmitted may not be sufficient (e.g. poor resolution of images) to allow for appropriate medical decision making by the Practitioner; and/or   In rare instances, security protocols could fail, causing a breach of personal health information.  Furthermore, I acknowledge that it is my responsibility to provide information about my medical history, conditions and care that is complete and accurate to the best of my ability. I acknowledge that Practitioner's advice, recommendations, and/or decision may be based on factors not within their control, such as incomplete or inaccurate data provided by me or distortions of diagnostic images or specimens that may result from electronic transmissions. I understand that the  practice of medicine is not an Chief Strategy Officer and that Practitioner makes no warranties or guarantees regarding treatment outcomes. I acknowledge that I will receive a copy of this consent concurrently upon execution via email to the email address I  last provided but may also request a printed copy by calling the office of [facility].    I understand that my insurance will be billed for this visit.   I have read or had this consent read to me.  I understand the contents of this consent, which adequately explains the benefits and risks of the Services being provided via telemedicine.   I have been provided ample opportunity to ask questions regarding this consent and the Services and have had my questions answered to my satisfaction.  I give my informed consent for the services to be provided through the use of telemedicine in my medical care  By participating in this telemedicine visit I agree to the above.

## 2019-05-03 ENCOUNTER — Encounter: Payer: Self-pay | Admitting: Family Medicine

## 2019-05-03 ENCOUNTER — Telehealth (INDEPENDENT_AMBULATORY_CARE_PROVIDER_SITE_OTHER): Payer: Medicare Other | Admitting: Cardiology

## 2019-05-03 ENCOUNTER — Encounter: Payer: Self-pay | Admitting: Cardiology

## 2019-05-03 ENCOUNTER — Ambulatory Visit (INDEPENDENT_AMBULATORY_CARE_PROVIDER_SITE_OTHER): Payer: Medicare Other | Admitting: Family Medicine

## 2019-05-03 VITALS — BP 120/70 | Ht 76.0 in | Wt >= 6400 oz

## 2019-05-03 DIAGNOSIS — N390 Urinary tract infection, site not specified: Secondary | ICD-10-CM | POA: Diagnosis not present

## 2019-05-03 DIAGNOSIS — I5043 Acute on chronic combined systolic (congestive) and diastolic (congestive) heart failure: Secondary | ICD-10-CM | POA: Diagnosis not present

## 2019-05-03 DIAGNOSIS — I4891 Unspecified atrial fibrillation: Secondary | ICD-10-CM

## 2019-05-03 DIAGNOSIS — E119 Type 2 diabetes mellitus without complications: Secondary | ICD-10-CM

## 2019-05-03 DIAGNOSIS — R0609 Other forms of dyspnea: Secondary | ICD-10-CM

## 2019-05-03 DIAGNOSIS — I428 Other cardiomyopathies: Secondary | ICD-10-CM | POA: Diagnosis not present

## 2019-05-03 DIAGNOSIS — Z0289 Encounter for other administrative examinations: Secondary | ICD-10-CM

## 2019-05-03 DIAGNOSIS — R609 Edema, unspecified: Secondary | ICD-10-CM | POA: Diagnosis not present

## 2019-05-03 DIAGNOSIS — I10 Essential (primary) hypertension: Secondary | ICD-10-CM

## 2019-05-03 DIAGNOSIS — I110 Hypertensive heart disease with heart failure: Secondary | ICD-10-CM | POA: Diagnosis not present

## 2019-05-03 DIAGNOSIS — R600 Localized edema: Secondary | ICD-10-CM | POA: Diagnosis not present

## 2019-05-03 DIAGNOSIS — R413 Other amnesia: Secondary | ICD-10-CM | POA: Diagnosis not present

## 2019-05-03 DIAGNOSIS — I5022 Chronic systolic (congestive) heart failure: Secondary | ICD-10-CM

## 2019-05-03 DIAGNOSIS — Z7189 Other specified counseling: Secondary | ICD-10-CM | POA: Diagnosis not present

## 2019-05-03 MED ORDER — AMIODARONE HCL 200 MG PO TABS: 200.0000 mg | ORAL_TABLET | Freq: Two times a day (BID) | ORAL | 3 refills | Status: DC

## 2019-05-03 MED ORDER — FUROSEMIDE 40 MG PO TABS: 40.0000 mg | ORAL_TABLET | Freq: Two times a day (BID) | ORAL | 3 refills | Status: DC

## 2019-05-03 MED ORDER — POTASSIUM CHLORIDE ER 10 MEQ PO TBCR: 10.0000 meq | EXTENDED_RELEASE_TABLET | Freq: Every day | ORAL | 3 refills | Status: DC

## 2019-05-03 NOTE — Patient Instructions (Signed)
Medication Instructions:  Start: Amiodarone 200 mg two times a day If you need a refill on your cardiac medications before your next appointment, please call your pharmacy.   Lab work: None  Testing/Procedures: None  Follow-Up: Your physician recommends that you follow up on 05/14/19 at 9:20 am with Dr. [name].

## 2019-05-03 NOTE — Progress Notes (Signed)
Subjective:    Patient ID: [name], male    DOB: Feb 23, 1946, 73 y.o.   MRN: [id number]  HPI Virtual Visit via Video Note  I connected with the patient on 05/03/19 at  2:00 PM EDT by a video enabled telemedicine application and verified that I am speaking with the correct person using two identifiers.  Location patient: home Location provider:work or home office Persons participating in the virtual visit: patient, provider  I discussed the limitations of evaluation and management by telemedicine and the availability of in person appointments. The patient expressed understanding and agreed to proceed.   HPI: Here with his wife and daughter to ask about leg swelling. His lower legs and feet have been swelling more than usual lately, no doubt as the result of being less mobile after his knee injury. He denies any more SOB than usual. He takes 40 mg of Lasix every morning and he tries to elevate the legs as much as possible. Also it has become extremely difficult for him to get up out of a chair without assistance and they ask about obtaining a power lift chair.   ROS: See pertinent positives and negatives per HPI.  Past Medical History:  Diagnosis Date  . Chronic systolic CHF (congestive heart failure) ([place])    a. Echo (08/28/13): Mild LVH, EF 35-40%, diffuse HK, moderate to severe LAE.  . Diabetes mellitus without complication ([place])   . Diverticulitis   . Headache(784.0)   . Hyperlipidemia   . Hypertension   . Lymphoma, non Hodgkin's    sees Dr. [name]   . Morbid obesity ([place])   . NICM (nonischemic cardiomyopathy) ([place])    a. R/L Heart cath 08/30/13: RA mean 8, RV 30/0, PA 27/12, mean PCWP 9, CO 5.67, CI 1.98; normal coronary arteries  . Sleep apnea     Past Surgical History:  Procedure Laterality Date  . HERNIA REPAIR     umblical  . incarcerated hernia     vental 11/19/08 Dr. [name]  . LEFT AND RIGHT HEART CATHETERIZATION WITH CORONARY ANGIOGRAM N/A  08/30/2013   Procedure: LEFT AND RIGHT HEART CATHETERIZATION WITH CORONARY ANGIOGRAM;  Surgeon: [name], MD;  Location: [facility] CATH LAB;  Service: Cardiovascular;  Laterality: N/A;    Family History  Problem Relation Age of Onset  . Heart failure Mother   . Hypertension Mother   . Cancer Father        colon  . Hypertension Father   . Diabetes Father   . Stroke Maternal Grandmother   . Diabetes Paternal Grandfather   . Cancer Maternal Grandfather      Current Outpatient Medications:  .  amiodarone (PACERONE) 200 MG tablet, Take 1 tablet (200 mg total) by mouth 2 (two) times daily., Disp: 180 tablet, Rfl: 3 .  amLODipine (NORVASC) 5 MG tablet, Take 1 tablet (5 mg total) by mouth daily., Disp: 90 tablet, Rfl: 3 .  apixaban (ELIQUIS) 5 MG TABS tablet, Take 1 tablet (5 mg total) by mouth 2 (two) times daily for 30 days., Disp: 60 tablet, Rfl: 0 .  atorvastatin (LIPITOR) 20 MG tablet, Take 1 tablet (20 mg total) by mouth daily., Disp: 90 tablet, Rfl: 3 .  clobetasol (TEMOVATE) 0.05 % external solution, Apply topically 2 (two) times daily. (Patient taking differently: Apply 1 application topically daily. ), Disp: 50 mL, Rfl: 5 .  FLUoxetine (PROZAC) 40 MG capsule, Take 1 capsule (40 mg total) by mouth daily., Disp: 90 capsule, Rfl:  3 .  furosemide (LASIX) 40 MG tablet, TAKE 1 TABLET BY MOUTH EVERY DAY, Disp: 90 tablet, Rfl: 3 .  memantine (NAMENDA) 10 MG tablet, Take 1 tablet every night for 1 week, then increase to 1 tablet twice a day (Patient not taking: Reported on 05/03/2019), Disp: 60 tablet, Rfl: 11 .  metFORMIN (GLUCOPHAGE) 500 MG tablet, TAKE 1 TABLET BY MOUTH TWICE DAILY WITH A MEAL (Patient taking differently: Take 500 mg by mouth 2 (two) times daily with a meal. TAKE 1 TABLET BY MOUTH TWICE DAILY WITH A MEAL), Disp: 180 tablet, Rfl: 3 .  metoprolol (TOPROL-XL) 200 MG 24 hr tablet, TAKE 1 TABLET BY MOUTH EVERY DAY, Disp: 90 tablet, Rfl: 3 .  Multiple Vitamins-Minerals (MULTIVITAMIN  MEN 50+) TABS, Take 1 tablet by mouth daily., Disp: , Rfl:  .  olmesartan (BENICAR) 40 MG tablet, Take 1 tablet (40 mg total) by mouth daily., Disp: 90 tablet, Rfl: 3 .  polyethylene glycol (MIRALAX / GLYCOLAX) packet, Take 17 g by mouth daily as needed for moderate constipation., Disp: , Rfl:  .  spironolactone (ALDACTONE) 25 MG tablet, TAKE 1 TABLET BY MOUTH EVERY DAY, Disp: 90 tablet, Rfl: 3  EXAM:  VITALS per patient if applicable:  GENERAL: alert, oriented, appears well and in no acute distress  HEENT: atraumatic, conjunttiva clear, no obvious abnormalities on inspection of external nose and ears  NECK: normal movements of the head and neck  LUNGS: on inspection no signs of respiratory distress, breathing rate appears normal, no obvious gross SOB, gasping or wheezing  CV: no obvious cyanosis  MS: moves all visible extremities without noticeable abnormality  PSYCH/NEURO: pleasant and cooperative, no obvious depression or anxiety, speech and thought processing grossly intact  ASSESSMENT AND PLAN: We will increase the Lasix to 40 mg BID, and I will add a potassium pill once a day to that. We will send an order for a lift chair to [facility].  Alysia Penna, MD  Discussed the following assessment and plan:  No diagnosis found.     I discussed the assessment and treatment plan with the patient. The patient was provided an opportunity to ask questions and all were answered. The patient agreed with the plan and demonstrated an understanding of the instructions.   The patient was advised to call back or seek an in-person evaluation if the symptoms worsen or if the condition fails to improve as anticipated.     Review of Systems     Objective:   Physical Exam        Assessment & Plan:

## 2019-05-03 NOTE — Progress Notes (Signed)
Virtual Visit via Video Note   This visit type was conducted due to national recommendations for restrictions regarding the COVID-19 Pandemic (e.g. social distancing) in an effort to limit this patient's exposure and mitigate transmission in our community.  Due to his co-morbid illnesses, this patient is at least at moderate risk for complications without adequate follow up.  This format is felt to be most appropriate for this patient at this time.  All issues noted in this document were discussed and addressed.  A limited physical exam was performed with this format.  Please refer to the patient's chart for his consent to telehealth for [facility].   Date:  05/03/2019   ID:  [name], DOB 09-May-1946, MRN [id number]  Patient Location: Home Provider Location: Office  PCP:  [name], MD  Cardiologist:  Buford Dresser, MD  Electrophysiologist:  None   Evaluation Performed:  New Patient Evaluation  Chief Complaint:  Re-establish cardiology care  History of Present Illness:    [name] is a 73 y.o. male with  seen for post hospital follow up, new atrial fibrillation diagnosis. He has a PMH of type II diabetes, hypertension, dyslipidemia. He has a history of systolic heart failure by echo in 2014 (EF 35-40%) which recovered to 55-60% in 2015.  The patient does not have symptoms concerning for COVID-19 infection (fever, chills, cough, or new shortness of breath).   He has not been seen by cardiology in over three years (11/2015, by Dr. Johnsie Cancel). He was admitted recently for shortness of breath and altered mental status, discharged 04/08/19. During this admission he was noted to have new onset atrial fibrillation. He was rate controlled with metoprolol and started on apixaban.   He presents via video conference today. He is joined by two additional family caretakers.  Their concerns today: -His legs are more swollen than usual. Just got a new scale, hasn't  weighed himself yet. Sob with minimal exertion. No clear PND/orthopnea. He is sitting up comfortably speaking today but can't do much activity before he has to stop. -His family is concerned for more confusion than normal. This was noted during his recent hospitalization. He is going to be seeing a neurologist, and he has a pending MRI. -Want to talk about afib -had a fall two weeks ago, feeling unsteady. No bleeding noted. No syncope.  We discussed his recent hospitalization, including the heart failure and atrial fibrillation. Reviewed what afib is, how we manage it. Reviewed risk of stroke and reason for blood thinner.  We also reviewed heart failure and did extensive heart failure education today.  Denies chest pain, shortness of breath at rest. No PND, orthopnea. No syncope or palpitations.   Past Medical History:  Diagnosis Date  . Chronic systolic CHF (congestive heart failure) ([place])    a. Echo (08/28/13): Mild LVH, EF 35-40%, diffuse HK, moderate to severe LAE.  . Diabetes mellitus without complication ([place])   . Diverticulitis   . Headache(784.0)   . Hyperlipidemia   . Hypertension   . Lymphoma, non Hodgkin's    sees Dr. [name]   . Morbid obesity ([place])   . NICM (nonischemic cardiomyopathy) ([place])    a. R/L Heart cath 08/30/13: RA mean 8, RV 30/0, PA 27/12, mean PCWP 9, CO 5.67, CI 1.98; normal coronary arteries  . Sleep apnea    Past Surgical History:  Procedure Laterality Date  . HERNIA REPAIR     umblical  . incarcerated hernia  vental 11/19/08 Dr. [name]  . LEFT AND RIGHT HEART CATHETERIZATION WITH CORONARY ANGIOGRAM N/A 08/30/2013   Procedure: LEFT AND RIGHT HEART CATHETERIZATION WITH CORONARY ANGIOGRAM;  Surgeon: [name], MD;  Location: [facility] CATH LAB;  Service: Cardiovascular;  Laterality: N/A;     Current Meds  Medication Sig  . amLODipine (NORVASC) 5 MG tablet Take 1 tablet (5 mg total) by mouth daily.  Marland Kitchen apixaban (ELIQUIS) 5 MG TABS tablet Take 1  tablet (5 mg total) by mouth 2 (two) times daily for 30 days.  Marland Kitchen atorvastatin (LIPITOR) 20 MG tablet Take 1 tablet (20 mg total) by mouth daily.  . clobetasol (TEMOVATE) 0.05 % external solution Apply topically 2 (two) times daily. (Patient taking differently: Apply 1 application topically daily. )  . FLUoxetine (PROZAC) 40 MG capsule Take 1 capsule (40 mg total) by mouth daily.  . furosemide (LASIX) 40 MG tablet TAKE 1 TABLET BY MOUTH EVERY DAY  . metFORMIN (GLUCOPHAGE) 500 MG tablet TAKE 1 TABLET BY MOUTH TWICE DAILY WITH A MEAL (Patient taking differently: Take 500 mg by mouth 2 (two) times daily with a meal. TAKE 1 TABLET BY MOUTH TWICE DAILY WITH A MEAL)  . metoprolol (TOPROL-XL) 200 MG 24 hr tablet TAKE 1 TABLET BY MOUTH EVERY DAY  . Multiple Vitamins-Minerals (MULTIVITAMIN MEN 50+) TABS Take 1 tablet by mouth daily.  Marland Kitchen olmesartan (BENICAR) 40 MG tablet Take 1 tablet (40 mg total) by mouth daily.  . polyethylene glycol (MIRALAX / GLYCOLAX) packet Take 17 g by mouth daily as needed for moderate constipation.  Marland Kitchen spironolactone (ALDACTONE) 25 MG tablet TAKE 1 TABLET BY MOUTH EVERY DAY     Allergies:   Patient has no known allergies.   Social History   Tobacco Use  . Smoking status: Former Smoker    Packs/day: 2.00    Years: 52.00    Pack years: 104.00    Types: Cigarettes    Start date: 08/27/1965    Quit date: 08/28/1999    Years since quitting: 19.6  . Smokeless tobacco: Never Used  . Tobacco comment: quit 13 years ago  Substance Use Topics  . Alcohol use: Yes    Alcohol/week: 0.0 standard drinks    Comment: occ  . Drug use: No     Family Hx: The patient's family history includes Cancer in his father and maternal grandfather; Diabetes in his father and paternal grandfather; Heart failure in his mother; Hypertension in his father and mother; Stroke in his maternal grandmother.  ROS:   Please see the history of present illness.    Constitutional: Negative for chills, fever,  night sweats, unintentional weight loss  HENT: Negative for ear pain and hearing loss.   Eyes: Negative for loss of vision and eye pain.  Respiratory: Negative for cough, sputum, wheezing.   Cardiovascular: See HPI. Gastrointestinal: Negative for abdominal pain, melena, and hematochezia.  Genitourinary: Negative for dysuria and hematuria.  Musculoskeletal: Negative for myalgias. Positive for recent fall Skin: Negative for itching and rash.  Neurological: Negative for focal weakness, focal sensory changes and loss of consciousness.  Endo/Heme/Allergies: Does bruise/bleed easily.  All other systems reviewed and are negative.   Prior CV studies:   The following studies were reviewed today: Echo 04/08/19  1. The left ventricle has mildly reduced systolic function, with an ejection fraction of 45-50%. The cavity size was normal. There is moderately increased left ventricular wall thickness. Left ventricular diastolic function could not be evaluated  secondary to atrial  fibrillation. Left ventricular diffuse hypokinesis.  2. Left atrial size was severely dilated.  3. Right atrial size was mildly dilated.  4. The mitral valve is abnormal. Mild thickening of the mitral valve leaflet.  5. The aortic valve is tricuspid. Mild thickening of the aortic valve. Aortic valve regurgitation is trivial by color flow Doppler.  6. There is mild dilatation of the aortic root measuring 43 mm.  7. Extremely limited; definity used; EF difficult to quantitate but appears mildly reduced; moderate LVH; mildly dilated aortic root; trace AI; biatrial enlargement.  Labs/Other Tests and Data Reviewed:    EKG:  An ECG dated 04/07/19 was personally reviewed today and demonstrated:  afib, rate controlled  Recent Labs: 04/03/2019: Pro B Natriuretic peptide (BNP) 383.0 04/07/2019: ALT 26; B Natriuretic Peptide 599.2; Magnesium 2.0; TSH 2.079 04/08/2019: BUN 29; Creatinine, Ser 1.07; Hemoglobin 15.7; Platelets 251; Potassium  4.0; Sodium 140   Recent Lipid Panel Lab Results  Component Value Date/Time   CHOL 249 (H) 02/24/2016 09:30 AM   TRIG 228.0 (H) 02/24/2016 09:30 AM   HDL 47.10 02/24/2016 09:30 AM   CHOLHDL 5 02/24/2016 09:30 AM   LDLCALC 113 (H) 08/28/2013 12:32 AM   LDLDIRECT 180.0 02/24/2016 09:30 AM    Wt Readings from Last 3 Encounters:  05/03/19 (!) 400 lb (181.4 kg)  04/26/19 (!) 400 lb (181.4 kg)  04/07/19 (!) 371 lb 4.1 oz (168.4 kg)     Objective:    Vital Signs:  BP 120/70   Ht 6\' 4"  (1.93 m)   Wt (!) 400 lb (181.4 kg)   BMI 48.69 kg/m    VITAL SIGNS:  reviewed GEN:  no acute distress EYES:  sclerae anicteric, EOMI - Extraocular Movements Intact RESPIRATORY:  normal respiratory effort, symmetric expansion CARDIOVASCULAR:  no visible JVD SKIN:  no rash, lesions or ulcers. MUSCULOSKELETAL:  no obvious deformities. NEURO:  no focal neuro defects seen PSYCH:  normal affect  ASSESSMENT & PLAN:    Atrial fibrillation: recent diagnosis CHA2DS2/VAS Stroke Risk= 4  -with his HF symptoms, will attempt rhythm control. Start amiodarone 200 mg BID, change to daily at follow up -Get ECG on office visit.  -if still in afib, consider cardioversion -continue apixaban  Acute on chronic systolic and diastolic dysfunction: diffuse hypokinesis on echo, EF 45-50%. Reports bilateral LE edema (not well seen on video) and dyspnea on exertion -It is difficult for me to assess his LE edema over the phone. Would like him to come into office to be seen in the next 2 weeks to assess in person -reviewed HF education, below -will need to find a way to do daily weights long term to monitor fluid -on metoprolol succinate 200 mg -on olmesartan 40 mg daily -on spironolactone 25 mg daily  Heart failure education: Do the following things EVERY DAY:  1) Weigh yourself EVERY morning after you go to the bathroom but before you eat or drink anything. Write this number down in a weight log/diary. If you gain  3 pounds overnight or 5 pounds in a week, call the office.  2) Take your medicines as prescribed. If you have concerns about your medications, please call us before you stop taking them.   3) Eat low salt foods-Limit salt (sodium) to 2000 mg per day. This will help prevent your body from holding onto fluid. Read food labels as many processed foods have a lot of sodium, especially canned goods and prepackaged meats. If you would like some assistance choosing low sodium  foods, we would be happy to set you up with a nutritionist.  4) Stay as active as you can everyday. Staying active will give you more energy and make your muscles stronger. Start with 5 minutes at a time and work your way up to 30 minutes a day. Break up your activities--do some in the morning and some in the afternoon. Start with 3 days per week and work your way up to 5 days as you can.  If you have chest pain, feel short of breath, dizzy, or lightheaded, STOP. If you don't feel better after a short rest, call 911. If you do feel better, call the office to let us know you have symptoms with exercise.  5) Limit all fluids for the day to less than 2 liters. Fluid includes all drinks, coffee, juice, ice chips, soup, jello, and all other liquids.  Hypertension: at goal today, continue current meds  COVID-19 Education: The signs and symptoms of COVID-19 were discussed with the patient and how to seek care for testing (follow up with PCP or arrange E-visit).  The importance of social distancing was discussed today.  Time:   Today, I have spent 33 live time minutes with the patient with telehealth technology discussing the above problems, with additional 20 minutes of preparation and coordination related to the visit and follow up.  Medication Adjustments/Labs and Tests Ordered: Current medicines are reviewed at length with the patient today.  Concerns regarding medicines are outlined above.   Patient Instructions  Medication  Instructions:  Start: Amiodarone 200 mg two times a day If you need a refill on your cardiac medications before your next appointment, please call your pharmacy.   Lab work: None  Testing/Procedures: None  Follow-Up: Your physician recommends that you follow up on 05/14/19 at 9:20 am with Dr. [name].       Signed, Buford Dresser, MD  05/03/2019 5:08 PM    [facility]

## 2019-05-05 ENCOUNTER — Other Ambulatory Visit: Payer: Self-pay | Admitting: Family Medicine

## 2019-05-07 DIAGNOSIS — I5022 Chronic systolic (congestive) heart failure: Secondary | ICD-10-CM | POA: Diagnosis not present

## 2019-05-07 DIAGNOSIS — E119 Type 2 diabetes mellitus without complications: Secondary | ICD-10-CM | POA: Diagnosis not present

## 2019-05-07 DIAGNOSIS — I110 Hypertensive heart disease with heart failure: Secondary | ICD-10-CM | POA: Diagnosis not present

## 2019-05-07 DIAGNOSIS — I4891 Unspecified atrial fibrillation: Secondary | ICD-10-CM | POA: Diagnosis not present

## 2019-05-07 DIAGNOSIS — N390 Urinary tract infection, site not specified: Secondary | ICD-10-CM | POA: Diagnosis not present

## 2019-05-07 DIAGNOSIS — R413 Other amnesia: Secondary | ICD-10-CM | POA: Diagnosis not present

## 2019-05-08 DIAGNOSIS — N390 Urinary tract infection, site not specified: Secondary | ICD-10-CM | POA: Diagnosis not present

## 2019-05-08 DIAGNOSIS — I4891 Unspecified atrial fibrillation: Secondary | ICD-10-CM | POA: Diagnosis not present

## 2019-05-08 DIAGNOSIS — I5022 Chronic systolic (congestive) heart failure: Secondary | ICD-10-CM | POA: Diagnosis not present

## 2019-05-08 DIAGNOSIS — R413 Other amnesia: Secondary | ICD-10-CM | POA: Diagnosis not present

## 2019-05-08 DIAGNOSIS — I110 Hypertensive heart disease with heart failure: Secondary | ICD-10-CM | POA: Diagnosis not present

## 2019-05-08 DIAGNOSIS — E119 Type 2 diabetes mellitus without complications: Secondary | ICD-10-CM | POA: Diagnosis not present

## 2019-05-09 ENCOUNTER — Other Ambulatory Visit: Payer: Self-pay | Admitting: Pharmacist

## 2019-05-09 DIAGNOSIS — E119 Type 2 diabetes mellitus without complications: Secondary | ICD-10-CM | POA: Diagnosis not present

## 2019-05-09 DIAGNOSIS — I4891 Unspecified atrial fibrillation: Secondary | ICD-10-CM | POA: Diagnosis not present

## 2019-05-09 DIAGNOSIS — I110 Hypertensive heart disease with heart failure: Secondary | ICD-10-CM | POA: Diagnosis not present

## 2019-05-09 DIAGNOSIS — R413 Other amnesia: Secondary | ICD-10-CM | POA: Diagnosis not present

## 2019-05-09 DIAGNOSIS — N390 Urinary tract infection, site not specified: Secondary | ICD-10-CM | POA: Diagnosis not present

## 2019-05-09 DIAGNOSIS — I5022 Chronic systolic (congestive) heart failure: Secondary | ICD-10-CM | POA: Diagnosis not present

## 2019-05-09 MED ORDER — APIXABAN 5 MG PO TABS: 5.0000 mg | ORAL_TABLET | Freq: Two times a day (BID) | ORAL | 0 refills | Status: DC

## 2019-05-10 ENCOUNTER — Encounter: Payer: Self-pay | Admitting: Cardiology

## 2019-05-11 DIAGNOSIS — Z7984 Long term (current) use of oral hypoglycemic drugs: Secondary | ICD-10-CM | POA: Diagnosis not present

## 2019-05-11 DIAGNOSIS — F329 Major depressive disorder, single episode, unspecified: Secondary | ICD-10-CM | POA: Diagnosis not present

## 2019-05-11 DIAGNOSIS — I110 Hypertensive heart disease with heart failure: Secondary | ICD-10-CM | POA: Diagnosis not present

## 2019-05-11 DIAGNOSIS — E119 Type 2 diabetes mellitus without complications: Secondary | ICD-10-CM | POA: Diagnosis not present

## 2019-05-11 DIAGNOSIS — E876 Hypokalemia: Secondary | ICD-10-CM | POA: Diagnosis not present

## 2019-05-11 DIAGNOSIS — I5022 Chronic systolic (congestive) heart failure: Secondary | ICD-10-CM | POA: Diagnosis not present

## 2019-05-11 DIAGNOSIS — G4733 Obstructive sleep apnea (adult) (pediatric): Secondary | ICD-10-CM | POA: Diagnosis not present

## 2019-05-11 DIAGNOSIS — R413 Other amnesia: Secondary | ICD-10-CM | POA: Diagnosis not present

## 2019-05-11 DIAGNOSIS — Z9861 Coronary angioplasty status: Secondary | ICD-10-CM | POA: Diagnosis not present

## 2019-05-11 DIAGNOSIS — E785 Hyperlipidemia, unspecified: Secondary | ICD-10-CM | POA: Diagnosis not present

## 2019-05-11 DIAGNOSIS — Z87891 Personal history of nicotine dependence: Secondary | ICD-10-CM | POA: Diagnosis not present

## 2019-05-11 DIAGNOSIS — I4891 Unspecified atrial fibrillation: Secondary | ICD-10-CM | POA: Diagnosis not present

## 2019-05-11 DIAGNOSIS — Z7901 Long term (current) use of anticoagulants: Secondary | ICD-10-CM | POA: Diagnosis not present

## 2019-05-11 DIAGNOSIS — I429 Cardiomyopathy, unspecified: Secondary | ICD-10-CM | POA: Diagnosis not present

## 2019-05-11 DIAGNOSIS — Z6841 Body Mass Index (BMI) 40.0 and over, adult: Secondary | ICD-10-CM | POA: Diagnosis not present

## 2019-05-11 DIAGNOSIS — R51 Headache: Secondary | ICD-10-CM | POA: Diagnosis not present

## 2019-05-11 DIAGNOSIS — K5792 Diverticulitis of intestine, part unspecified, without perforation or abscess without bleeding: Secondary | ICD-10-CM | POA: Diagnosis not present

## 2019-05-11 DIAGNOSIS — N390 Urinary tract infection, site not specified: Secondary | ICD-10-CM | POA: Diagnosis not present

## 2019-05-11 DIAGNOSIS — C8590 Non-Hodgkin lymphoma, unspecified, unspecified site: Secondary | ICD-10-CM | POA: Diagnosis not present

## 2019-05-14 ENCOUNTER — Ambulatory Visit (INDEPENDENT_AMBULATORY_CARE_PROVIDER_SITE_OTHER): Payer: Medicare Other | Admitting: Cardiology

## 2019-05-14 ENCOUNTER — Encounter: Payer: Self-pay | Admitting: Cardiology

## 2019-05-14 ENCOUNTER — Other Ambulatory Visit: Payer: Self-pay

## 2019-05-14 VITALS — BP 137/78 | HR 76 | Temp 93.2°F | Ht 76.0 in | Wt 368.0 lb

## 2019-05-14 DIAGNOSIS — Z7189 Other specified counseling: Secondary | ICD-10-CM

## 2019-05-14 DIAGNOSIS — I4891 Unspecified atrial fibrillation: Secondary | ICD-10-CM | POA: Diagnosis not present

## 2019-05-14 DIAGNOSIS — I428 Other cardiomyopathies: Secondary | ICD-10-CM

## 2019-05-14 DIAGNOSIS — Z79899 Other long term (current) drug therapy: Secondary | ICD-10-CM | POA: Diagnosis not present

## 2019-05-14 DIAGNOSIS — R413 Other amnesia: Secondary | ICD-10-CM | POA: Diagnosis not present

## 2019-05-14 DIAGNOSIS — R609 Edema, unspecified: Secondary | ICD-10-CM

## 2019-05-14 DIAGNOSIS — I5042 Chronic combined systolic (congestive) and diastolic (congestive) heart failure: Secondary | ICD-10-CM

## 2019-05-14 DIAGNOSIS — I110 Hypertensive heart disease with heart failure: Secondary | ICD-10-CM | POA: Diagnosis not present

## 2019-05-14 DIAGNOSIS — N390 Urinary tract infection, site not specified: Secondary | ICD-10-CM | POA: Diagnosis not present

## 2019-05-14 DIAGNOSIS — E119 Type 2 diabetes mellitus without complications: Secondary | ICD-10-CM | POA: Diagnosis not present

## 2019-05-14 DIAGNOSIS — I5022 Chronic systolic (congestive) heart failure: Secondary | ICD-10-CM | POA: Diagnosis not present

## 2019-05-14 DIAGNOSIS — I10 Essential (primary) hypertension: Secondary | ICD-10-CM

## 2019-05-14 LAB — BASIC METABOLIC PANEL
BUN/Creatinine Ratio: 24 (ref 10–24)
BUN: 29 mg/dL — ABNORMAL HIGH (ref 8–27)
CO2: 25 mmol/L (ref 20–29)
Calcium: 9.4 mg/dL (ref 8.6–10.2)
Chloride: 97 mmol/L (ref 96–106)
Creatinine, Ser: 1.21 mg/dL (ref 0.76–1.27)
GFR calc Af Amer: 68 mL/min/{1.73_m2} (ref >59)
GFR calc non Af Amer: 59 mL/min/{1.73_m2} — ABNORMAL LOW (ref >59)
Glucose: 98 mg/dL (ref 65–99)
Potassium: 4.8 mmol/L (ref 3.5–5.2)
Sodium: 140 mmol/L (ref 134–144)

## 2019-05-14 NOTE — Progress Notes (Signed)
Cardiology Office Note:    Date:  05/14/2019   ID:  SUN WILENSKY, DOB 22-Apr-1946, MRN [id number]  PCP:  [name], MD  Cardiologist:  Buford Dresser, MD PhD  Referring MD: [name], MD   CC: follow up LE edema  History of Present Illness:    [name] is a 73 y.o. male with a hx of atrial fibrillation, type II diabetes, hypertension, dyslipidemia, previously recovered systolic heart failure (EF 35-40% 2015 to 55-60% 2015) with most recent EF 45-50%.  I met him via video visit 05/03/19, please see notes.  Concerns today: -What is his rhythm today (afib) -weight at home was either 378 or 368 lbs (he can't remember) but is stable to decreasing from his prior. -working on diet, fluid intake at home.   Breathing is still difficult with exertion. Walks with walker or slowly on his own, is very short of breath when he pushes himself. Lies on his side when he is sleeping--initially said flat but family says he is upright in recliner. No PND. Feels like his LE edema is getting better.   No further falls. No bleeding, no melena or hematochezia. No chest pain.    Past Medical History:  Diagnosis Date  . Chronic systolic CHF (congestive heart failure) ([place])    a. Echo (08/28/13): Mild LVH, EF 35-40%, diffuse HK, moderate to severe LAE.  . Diabetes mellitus without complication ([place])   . Diverticulitis   . Headache(784.0)   . Hyperlipidemia   . Hypertension   . Lymphoma, non Hodgkin's    sees Dr. [name]   . Morbid obesity ([place])   . NICM (nonischemic cardiomyopathy) ([place])    a. R/L Heart cath 08/30/13: RA mean 8, RV 30/0, PA 27/12, mean PCWP 9, CO 5.67, CI 1.98; normal coronary arteries  . Sleep apnea     Past Surgical History:  Procedure Laterality Date  . HERNIA REPAIR     umblical  . incarcerated hernia     vental 11/19/08 Dr. [name]  . LEFT AND RIGHT HEART CATHETERIZATION WITH CORONARY ANGIOGRAM N/A 08/30/2013   Procedure: LEFT AND RIGHT  HEART CATHETERIZATION WITH CORONARY ANGIOGRAM;  Surgeon: [name], MD;  Location: [facility] CATH LAB;  Service: Cardiovascular;  Laterality: N/A;    Current Medications: Current Outpatient Medications on File Prior to Visit  Medication Sig  . amiodarone (PACERONE) 200 MG tablet Take 1 tablet (200 mg total) by mouth 2 (two) times daily.  Marland Kitchen amLODipine (NORVASC) 5 MG tablet Take 1 tablet (5 mg total) by mouth daily.  Marland Kitchen apixaban (ELIQUIS) 5 MG TABS tablet Take 1 tablet (5 mg total) by mouth 2 (two) times daily.  Marland Kitchen atorvastatin (LIPITOR) 20 MG tablet Take 1 tablet (20 mg total) by mouth daily.  . clobetasol (TEMOVATE) 0.05 % external solution Apply topically 2 (two) times daily. (Patient taking differently: Apply 1 application topically daily. )  . FLUoxetine (PROZAC) 40 MG capsule Take 1 capsule (40 mg total) by mouth daily.  . furosemide (LASIX) 40 MG tablet Take 1 tablet (40 mg total) by mouth 2 (two) times daily.  . memantine (NAMENDA) 10 MG tablet Take 1 tablet every night for 1 week, then increase to 1 tablet twice a day (Patient not taking: Reported on 05/03/2019)  . metFORMIN (GLUCOPHAGE) 500 MG tablet TAKE 1 TABLET BY MOUTH TWICE DAILY WITH A MEAL (Patient taking differently: Take 500 mg by mouth 2 (two) times daily with a meal. TAKE 1 TABLET BY  MOUTH TWICE DAILY WITH A MEAL)  . metoprolol (TOPROL-XL) 200 MG 24 hr tablet TAKE 1 TABLET BY MOUTH EVERY DAY  . Multiple Vitamins-Minerals (MULTIVITAMIN MEN 50+) TABS Take 1 tablet by mouth daily.  Marland Kitchen olmesartan (BENICAR) 40 MG tablet Take 1 tablet (40 mg total) by mouth daily.  . polyethylene glycol (MIRALAX / GLYCOLAX) packet Take 17 g by mouth daily as needed for moderate constipation.  . potassium chloride (KLOR-CON 10) 10 MEQ tablet Take 1 tablet (10 mEq total) by mouth daily.  Marland Kitchen spironolactone (ALDACTONE) 25 MG tablet TAKE 1 TABLET BY MOUTH EVERY DAY   No current facility-administered medications on file prior to visit.      Allergies:    Patient has no known allergies.   Social History   Socioeconomic History  . Marital status: Married    Spouse name: Not on file  . Number of children: Not on file  . Years of education: Not on file  . Highest education level: Not on file  Occupational History  . Occupation: retired--postal Secretary/administrator: RETIRED  Social Needs  . Financial resource strain: Not on file  . Food insecurity    Worry: Not on file    Inability: Not on file  . Transportation needs    Medical: Not on file    Non-medical: Not on file  Tobacco Use  . Smoking status: Former Smoker    Packs/day: 2.00    Years: 52.00    Pack years: 104.00    Types: Cigarettes    Start date: 08/27/1965    Quit date: 08/28/1999    Years since quitting: 19.7  . Smokeless tobacco: Never Used  . Tobacco comment: quit 13 years ago  Substance and Sexual Activity  . Alcohol use: Yes    Alcohol/week: 0.0 standard drinks    Comment: occ  . Drug use: No  . Sexual activity: Not on file  Lifestyle  . Physical activity    Days per week: Not on file    Minutes per session: Not on file  . Stress: Not on file  Relationships  . Social Herbalist on phone: Not on file    Gets together: Not on file    Attends religious service: Not on file    Active member of club or organization: Not on file    Attends meetings of clubs or organizations: Not on file    Relationship status: Not on file  Other Topics Concern  . Not on file  Social History Narrative   Lives with wife in two story home      Right handed      Highest level of edu- some college      Retired     Family History: The patient's family history includes Cancer in his father and maternal grandfather; Diabetes in his father and paternal grandfather; Heart failure in his mother; Hypertension in his father and mother; Stroke in his maternal grandmother.  ROS:   Please see the history of present illness.  Additional pertinent ROS:  Constitutional:  Negative for chills, fever, night sweats, unintentional weight loss  HENT: Negative for ear pain and hearing loss.   Eyes: Negative for loss of vision and eye pain.  Respiratory: Negative for cough, sputum, wheezing.   Cardiovascular: See HPI. Gastrointestinal: Negative for abdominal pain, melena, and hematochezia.  Genitourinary: Negative for dysuria and hematuria.  Musculoskeletal: Negative for falls and myalgias.  Skin: Negative for itching and rash.  Neurological: Negative for focal weakness, focal sensory changes and loss of consciousness.  Endo/Heme/Allergies: Does not bruise/bleed easily.    EKGs/Labs/Other Studies Reviewed:    The following studies were reviewed today: Echo 04/08/19 1. The left ventricle has mildly reduced systolic function, with an ejection fraction of 45-50%. The cavity size was normal. There is moderately increased left ventricular wall thickness. Left ventricular diastolic function could not be evaluated  secondary to atrial fibrillation. Left ventricular diffuse hypokinesis. 2. Left atrial size was severely dilated. 3. Right atrial size was mildly dilated. 4. The mitral valve is abnormal. Mild thickening of the mitral valve leaflet. 5. The aortic valve is tricuspid. Mild thickening of the aortic valve. Aortic valve regurgitation is trivial by color flow Doppler. 6. There is mild dilatation of the aortic root measuring 43 mm. 7. Extremely limited; definity used; EF difficult to quantitate but appears mildly reduced; moderate LVH; mildly dilated aortic root; trace AI; biatrial enlargement.  EKG:  EKG is personally reviewed.  The ekg ordered today demonstrates atrial fibrillation at 76 bpm, PVC, IVCD  Recent Labs: 04/03/2019: Pro B Natriuretic peptide (BNP) 383.0 04/07/2019: ALT 26; B Natriuretic Peptide 599.2; Magnesium 2.0; TSH 2.079 04/08/2019: BUN 29; Creatinine, Ser 1.07; Hemoglobin 15.7; Platelets 251; Potassium 4.0; Sodium 140  Recent Lipid Panel     Component Value Date/Time   CHOL 249 (H) 02/24/2016 0930   TRIG 228.0 (H) 02/24/2016 0930   HDL 47.10 02/24/2016 0930   CHOLHDL 5 02/24/2016 0930   VLDL 45.6 (H) 02/24/2016 0930   LDLCALC 113 (H) 08/28/2013 0032   LDLDIRECT 180.0 02/24/2016 0930    Physical Exam:    VS:  BP 137/78   Pulse 76   Temp (!) 93.2 F (34 C)   Ht 6' 4"  (1.93 m)   Wt (!) 368 lb (166.9 kg)   BMI 44.79 kg/m     Wt Readings from Last 3 Encounters:  05/03/19 (!) 400 lb (181.4 kg)  04/26/19 (!) 400 lb (181.4 kg)  04/07/19 (!) 371 lb 4.1 oz (168.4 kg)     GEN: Well nourished, well developed in no acute distress HEENT: Normal NECK: difficult to visualize JVD due to body habitus LYMPHATICS: No lymphadenopathy CARDIAC: regular rhythm, normal S1 and S2, no murmurs, rubs, gallops. Radial and DP pulses 2+ bilaterally. RESPIRATORY:  Clear to auscultation without rales, wheezing or rhonchi  ABDOMEN: Soft, non-tender, non-distended MUSCULOSKELETAL:  Bilateral brawny edema with skin discoloration changes. Firm/pitting up to thigh. Crusted lesions, no active weeping seen  SKIN: Warm and dry NEUROLOGIC:  Alert and oriented x 3 PSYCHIATRIC:  Normal affect   ASSESSMENT:    1. Atrial fibrillation, unspecified type ([place])   2. Medication management   3. Chronic combined systolic and diastolic heart failure ([place])   4. NICM (nonischemic cardiomyopathy) ([place])   5. Essential hypertension   6. Chronic edema   7. Counseling on health promotion and disease prevention    PLAN:    Atrial fibrillation: recent diagnosis CHA2DS2/VAS Stroke Risk= 4  -we discussed cardioversion today. He is not sure that he wishes to proceed. He will discuss with his family and call to let us know what he decides. Will continue to load with 200 mg BID for now -rate controlled on metoprolol -continue apixaban  Acute on chronic systolic and diastolic dysfunction: diffuse hypokinesis on echo, EF 45-50%. Chronic LE edema and dyspnea on  exertion -exam appears consistent with mixed picture of acute edema and chronic edema from venous insufficiency. -reviewed HF  education again. This is difficult for him, and he needs assistance. Need to find a way to do daily weights long term to monitor fluid -on metoprolol succinate 200 mg -on olmesartan 40 mg daily -on spironolactone 25 mg daily -continue diuresis with furosemide 40 mg BID with K supplement -check BMET today  Hypertension: slightly elevated in office but reports usually well controlled at home. Continue to monitor.  Cardiac risk counseling and prevention recommendations: -recommend heart healthy/Mediterranean diet, with whole grains, fruits, vegetable, fish, lean meats, nuts, and olive oil. Limit salt. -recommend moderate walking, 3-5 times/week for 30-50 minutes each session. Aim for at least 150 minutes.week. Goal should be pace of 3 miles/hours, or walking 1.5 miles in 30 minutes -recommend avoidance of tobacco products. Avoid excess alcohol.  Plan for follow up: 1 month, may decide on cardioversion sooner  Medication Adjustments/Labs and Tests Ordered: Current medicines are reviewed at length with the patient today.  Concerns regarding medicines are outlined above.  Orders Placed This Encounter  Procedures  . Basic metabolic panel  . EKG 12-Lead   No orders of the defined types were placed in this encounter.   Patient Instructions  Medication Instructions:  Your Physician recommend you continue on your current medication as directed.    If you need a refill on your cardiac medications before your next appointment, please call your pharmacy.   Lab work: Your physician recommends that you return for lab work today (BMP).  If you have labs (blood work) drawn today and your tests are completely normal, you will receive your results only by: Marland Kitchen MyChart Message (if you have MyChart) OR . A paper copy in the mail If you have any lab test that is abnormal or we  need to change your treatment, we will call you to review the results.  Testing/Procedures: None  Follow-Up: At [facility], you and your health needs are our priority.  As part of our continuing mission to provide you with exceptional heart care, we have created designated Provider Care Teams.  These Care Teams include your primary Cardiologist (physician) and Advanced Practice Providers (APPs -  Physician Assistants and Nurse Practitioners) who all work together to provide you with the care you need, when you need it. You will need a virtual follow up appointment in 1 months.  Please call our office 2 months in advance to schedule this appointment.  You may see Buford Dresser, MD or one of the following Advanced Practice Providers on your designated Care Team:   Rosaria Ferries, PA-C . Jory Sims, DNP, ANP       Signed, Buford Dresser, MD PhD 05/14/2019  [place]

## 2019-05-14 NOTE — Patient Instructions (Signed)
Medication Instructions:  Your Physician recommend you continue on your current medication as directed.    If you need a refill on your cardiac medications before your next appointment, please call your pharmacy.   Lab work: Your physician recommends that you return for lab work today (BMP).  If you have labs (blood work) drawn today and your tests are completely normal, you will receive your results only by: Marland Kitchen MyChart Message (if you have MyChart) OR . A paper copy in the mail If you have any lab test that is abnormal or we need to change your treatment, we will call you to review the results.  Testing/Procedures: None  Follow-Up: At [facility], you and your health needs are our priority.  As part of our continuing mission to provide you with exceptional heart care, we have created designated Provider Care Teams.  These Care Teams include your primary Cardiologist (physician) and Advanced Practice Providers (APPs -  Physician Assistants and Nurse Practitioners) who all work together to provide you with the care you need, when you need it. You will need a virtual follow up appointment in 1 months.  Please call our office 2 months in advance to schedule this appointment.  You may see Buford Dresser, MD or one of the following Advanced Practice Providers on your designated Care Team:   Rosaria Ferries, PA-C . Jory Sims, DNP, ANP

## 2019-05-15 DIAGNOSIS — I5022 Chronic systolic (congestive) heart failure: Secondary | ICD-10-CM | POA: Diagnosis not present

## 2019-05-15 DIAGNOSIS — R413 Other amnesia: Secondary | ICD-10-CM | POA: Diagnosis not present

## 2019-05-15 DIAGNOSIS — I110 Hypertensive heart disease with heart failure: Secondary | ICD-10-CM | POA: Diagnosis not present

## 2019-05-15 DIAGNOSIS — N390 Urinary tract infection, site not specified: Secondary | ICD-10-CM | POA: Diagnosis not present

## 2019-05-15 DIAGNOSIS — I4891 Unspecified atrial fibrillation: Secondary | ICD-10-CM | POA: Diagnosis not present

## 2019-05-15 DIAGNOSIS — E119 Type 2 diabetes mellitus without complications: Secondary | ICD-10-CM | POA: Diagnosis not present

## 2019-05-17 ENCOUNTER — Encounter: Payer: Self-pay | Admitting: Cardiology

## 2019-05-17 DIAGNOSIS — R609 Edema, unspecified: Secondary | ICD-10-CM | POA: Insufficient documentation

## 2019-05-21 ENCOUNTER — Telehealth: Payer: Self-pay | Admitting: Cardiology

## 2019-05-21 DIAGNOSIS — I4891 Unspecified atrial fibrillation: Secondary | ICD-10-CM | POA: Diagnosis not present

## 2019-05-21 DIAGNOSIS — N390 Urinary tract infection, site not specified: Secondary | ICD-10-CM | POA: Diagnosis not present

## 2019-05-21 DIAGNOSIS — E119 Type 2 diabetes mellitus without complications: Secondary | ICD-10-CM | POA: Diagnosis not present

## 2019-05-21 DIAGNOSIS — I5022 Chronic systolic (congestive) heart failure: Secondary | ICD-10-CM | POA: Diagnosis not present

## 2019-05-21 DIAGNOSIS — R413 Other amnesia: Secondary | ICD-10-CM | POA: Diagnosis not present

## 2019-05-21 DIAGNOSIS — I110 Hypertensive heart disease with heart failure: Secondary | ICD-10-CM | POA: Diagnosis not present

## 2019-05-21 NOTE — Telephone Encounter (Signed)
Wife updated and voiced understanding.

## 2019-05-21 NOTE — Telephone Encounter (Signed)
New Message     Pts wife is calling back for results    Please call

## 2019-05-21 NOTE — Telephone Encounter (Signed)
Patient calling back. Thanks!

## 2019-05-22 ENCOUNTER — Other Ambulatory Visit: Payer: Self-pay | Admitting: Neurology

## 2019-05-23 DIAGNOSIS — I110 Hypertensive heart disease with heart failure: Secondary | ICD-10-CM | POA: Diagnosis not present

## 2019-05-23 DIAGNOSIS — E119 Type 2 diabetes mellitus without complications: Secondary | ICD-10-CM | POA: Diagnosis not present

## 2019-05-23 DIAGNOSIS — I4891 Unspecified atrial fibrillation: Secondary | ICD-10-CM | POA: Diagnosis not present

## 2019-05-23 DIAGNOSIS — I5022 Chronic systolic (congestive) heart failure: Secondary | ICD-10-CM | POA: Diagnosis not present

## 2019-05-23 DIAGNOSIS — N390 Urinary tract infection, site not specified: Secondary | ICD-10-CM | POA: Diagnosis not present

## 2019-05-23 DIAGNOSIS — R413 Other amnesia: Secondary | ICD-10-CM | POA: Diagnosis not present

## 2019-05-25 ENCOUNTER — Ambulatory Visit (INDEPENDENT_AMBULATORY_CARE_PROVIDER_SITE_OTHER)
Admission: RE | Admit: 2019-05-25 | Discharge: 2019-05-25 | Disposition: A | Discharge status: Home / Self Care | Payer: Medicare Other | Source: Ambulatory Visit

## 2019-05-25 DIAGNOSIS — L03115 Cellulitis of right lower limb: Secondary | ICD-10-CM | POA: Diagnosis not present

## 2019-05-25 DIAGNOSIS — L089 Local infection of the skin and subcutaneous tissue, unspecified: Secondary | ICD-10-CM | POA: Diagnosis not present

## 2019-05-25 DIAGNOSIS — T148XXA Other injury of unspecified body region, initial encounter: Secondary | ICD-10-CM

## 2019-05-25 MED ORDER — DOXYCYCLINE HYCLATE 100 MG PO CAPS: 100.0000 mg | ORAL_CAPSULE | Freq: Two times a day (BID) | ORAL | 0 refills | Status: DC

## 2019-05-25 NOTE — Discharge Instructions (Signed)
I am worried about this wound and would like it to be seen in person by Monday.  You may start antibiotics.  If your doctor cannot see you on Monday please come in the urgent care for evaluation to ensure that no further treatment is appropriate.  If this worsens at all- increased pain, drainage, numbness or tingling, fevers, please come to urgent care this weekend or even go the ER

## 2019-05-25 NOTE — ED Provider Notes (Signed)
Virtual Visit via Video Note:  [name]  initiated request for Telemedicine visit with [facility]gent [facility] team. I connected with [name]  on 05/25/2019 at 10:11 PM  for a synchronized telemedicine visit using a video enabled HIPPA compliant telemedicine application. I verified that I am speaking with [name]  using two identifiers. Zigmund Gottron, NP  was physically located in a [facility] site and [name] was located at a different location.   The limitations of evaluation and management by telemedicine as well as the availability of in-person appointments were discussed. Patient was informed that he  may incur a bill ( including co-pay) for this virtual visit encounter. [name]  expressed understanding and gave verbal consent to proceed with virtual visit.     History of Present Illness:[name]  is a 73 y.o. male presents with complaints of knee injury after a fall 6 weeks ago. He had landed on a stepping stone, which resulted in wound to the anterior aspect of his knee.  His leg is now red and swollen. Some drainage, although this has decreased. Pain has not worsened, pain has improved. Wife comes on the video as well as states she is concerned as there is newer redness and swelling to the area. No fevers. Denies any previous similar. He does have a good relationship with his PCP, stating he will be able to be in contact on Monday (two days.) he is diabetic.   Past Medical History:  Diagnosis Date  . Chronic systolic CHF (congestive heart failure) ([place])    a. Echo (08/28/13): Mild LVH, EF 35-40%, diffuse HK, moderate to severe LAE.  . Diabetes mellitus without complication ([place])   . Diverticulitis   . Headache(784.0)   . Hyperlipidemia   . Hypertension   . Lymphoma, non Hodgkin's    sees Dr. [name]   . Morbid obesity ([place])   . NICM (nonischemic cardiomyopathy) ([place])    a. R/L Heart cath 08/30/13: RA mean 8, RV  30/0, PA 27/12, mean PCWP 9, CO 5.67, CI 1.98; normal coronary arteries  . Sleep apnea     No Known Allergies      Observations/Objective: Alert, oriented, non toxic in appearance. Clear coherent speech without difficulty. No increased work of breathing visualized.  Large scabbed wound to anterior knee/ distal thigh; no active drainage visible; surrounding redness which appears to extend down to calf, with swelling; does appear that there is some baseline hyperpigementation/ red tone to skin to left leg as well  Assessment and Plan: Large wound and redness and swelling, sounds like has been present for 6 weeks, in presence of diabetes. No fevers. No increased pain or drainage. New redness and swelling. Discussed that I do recommend this to be seen on Monday, if not sooner, either with PCP or in UC or even ER. Strict ER precautions provided due to concern for sepsis vs need for invasive treatment. Patient and wife state they will be able to speak to PCP on Monday, and agreeable to being seen ASAP. Doxy started in the interim.   Follow Up Instructions:    I discussed the assessment and treatment plan with the patient. The patient was provided an opportunity to ask questions and all were answered. The patient agreed with the plan and demonstrated an understanding of the instructions.   The patient was advised to call back or seek an in-person evaluation if the symptoms worsen or if  the condition fails to improve as anticipated.  I provided 15 minutes of non-face-to-face time during this encounter.    Zigmund Gottron, NP  05/25/2019 10:11 PM         Zigmund Gottron, NP 05/25/19 2216

## 2019-05-27 ENCOUNTER — Ambulatory Visit
Admission: RE | Admit: 2019-05-27 | Discharge: 2019-05-27 | Disposition: A | Discharge status: Home / Self Care | Payer: Medicare Other | Source: Ambulatory Visit | Attending: Neurology | Admitting: Neurology

## 2019-05-27 ENCOUNTER — Other Ambulatory Visit: Payer: Self-pay

## 2019-05-27 DIAGNOSIS — F03B18 Unspecified dementia, moderate, with other behavioral disturbance: Secondary | ICD-10-CM

## 2019-05-27 DIAGNOSIS — F0391 Unspecified dementia with behavioral disturbance: Secondary | ICD-10-CM

## 2019-05-28 ENCOUNTER — Ambulatory Visit (INDEPENDENT_AMBULATORY_CARE_PROVIDER_SITE_OTHER): Payer: Medicare Other | Admitting: Family Medicine

## 2019-05-28 ENCOUNTER — Encounter: Payer: Self-pay | Admitting: Family Medicine

## 2019-05-28 VITALS — BP 110/82 | HR 74 | Temp 97.7°F | Ht 76.0 in

## 2019-05-28 DIAGNOSIS — S80811D Abrasion, right lower leg, subsequent encounter: Secondary | ICD-10-CM

## 2019-05-28 DIAGNOSIS — E119 Type 2 diabetes mellitus without complications: Secondary | ICD-10-CM | POA: Diagnosis not present

## 2019-05-28 DIAGNOSIS — L03115 Cellulitis of right lower limb: Secondary | ICD-10-CM

## 2019-05-28 LAB — POCT GLYCOSYLATED HEMOGLOBIN (HGB A1C): Hemoglobin A1C: 5.6 % (ref 4.0–5.6)

## 2019-05-28 MED ORDER — LEVOFLOXACIN 500 MG PO TABS: 500.0000 mg | ORAL_TABLET | Freq: Every day | ORAL | 0 refills | Status: DC

## 2019-05-28 NOTE — Progress Notes (Signed)
   Subjective:    Patient ID: [name], male    DOB: November 28, 1945, 73 y.o.   MRN: [id number]  HPI Here to follow up an urgent care visit on 05-25-19 for an injury to the right leg. About 7 weeks ago he fell in his backyard and the leg landed on a concrete paving stone. This caused a large abrasion that led to a cellulitis of the leg. He never had fevers, but the leg turned red and warm. He applied Neosporin for about a week and then stopped. Now he is leaving it open to the air. There is no pain at all. He says is has been slowly healing and the redness is slowly fading away. At the urgent care visit he was started on Doxycycline, which he has been taking. He does not check his glucoses at home, but the A1c is 5.6 today.     Review of Systems  Constitutional: Negative.   Respiratory: Negative.   Cardiovascular: Negative.   Skin: Positive for color change and wound.       Objective:   Physical Exam Constitutional:      Appearance: Normal appearance. He is not ill-appearing.     Comments: He walks with a cane   Cardiovascular:     Rate and Rhythm: Normal rate and regular rhythm.     Pulses: Normal pulses.     Heart sounds: Normal heart sounds.  Pulmonary:     Effort: Pulmonary effort is normal.     Breath sounds: Normal breath sounds.  Skin:    Comments: There is a large superficial abrasion about 10 cm in diameter on the medial side of the right knee and distal thigh. This is covered by a thick eschar. There is an area of erythema and warmth around this wound extending down the lower leg almost to the ankle. This is not tender. There is scant serosanguinous drainage from the wound   Neurological:     Mental Status: He is alert.           Assessment & Plan:  Cellulitis from an extensive abrasion. He will stay on Doxycycline and we will add Levaquin to this for 14 days. Recheck here in one week.  Alysia Penna, MD

## 2019-05-28 NOTE — Patient Instructions (Signed)
Health Maintenance Due  Topic Date Due  . Hepatitis C Screening  10/25/1945  . FOOT EXAM  05/12/1956  . OPHTHALMOLOGY EXAM  05/12/1956  . COLONOSCOPY  05/12/1996  . HEMOGLOBIN A1C  07/05/2017  . INFLUENZA VACCINE  05/18/2019    Depression screen PHQ 2/9 08/15/2018  Decreased Interest 0  Down, Depressed, Hopeless 0  PHQ - 2 Score 0

## 2019-05-30 ENCOUNTER — Telehealth: Payer: Self-pay | Admitting: Family Medicine

## 2019-05-30 DIAGNOSIS — I4891 Unspecified atrial fibrillation: Secondary | ICD-10-CM | POA: Diagnosis not present

## 2019-05-30 DIAGNOSIS — E119 Type 2 diabetes mellitus without complications: Secondary | ICD-10-CM | POA: Diagnosis not present

## 2019-05-30 DIAGNOSIS — N390 Urinary tract infection, site not specified: Secondary | ICD-10-CM | POA: Diagnosis not present

## 2019-05-30 DIAGNOSIS — R413 Other amnesia: Secondary | ICD-10-CM | POA: Diagnosis not present

## 2019-05-30 DIAGNOSIS — I110 Hypertensive heart disease with heart failure: Secondary | ICD-10-CM | POA: Diagnosis not present

## 2019-05-30 DIAGNOSIS — I5022 Chronic systolic (congestive) heart failure: Secondary | ICD-10-CM | POA: Diagnosis not present

## 2019-05-30 NOTE — Telephone Encounter (Signed)
See note

## 2019-05-30 NOTE — Telephone Encounter (Signed)
Copied from [place] [id number]. Topic: Quick Communication - Home Health Verbal Orders >> May 30, 2019  1:37 PM Erick Blinks wrote: Caller/Agency: [facility] Number: [phone] Requesting OT/PT/Skilled Nursing/Social Work/Speech Therapy: PT extension, recertify for another 9 more weeks  Frequency: Extending 1 next week for recertifying  2 week 2  1 week 3 2 week 1 1 week 3

## 2019-05-31 NOTE — Telephone Encounter (Signed)
Verbals have been given.

## 2019-05-31 NOTE — Telephone Encounter (Signed)
Ok for orders? 

## 2019-05-31 NOTE — Telephone Encounter (Signed)
Please okay the orders  

## 2019-06-04 ENCOUNTER — Other Ambulatory Visit: Payer: Self-pay | Admitting: Cardiology

## 2019-06-04 ENCOUNTER — Ambulatory Visit (INDEPENDENT_AMBULATORY_CARE_PROVIDER_SITE_OTHER): Payer: Medicare Other | Admitting: Family Medicine

## 2019-06-04 ENCOUNTER — Encounter: Payer: Self-pay | Admitting: Family Medicine

## 2019-06-04 VITALS — BP 120/60 | HR 95 | Temp 97.6°F | Wt 367.4 lb

## 2019-06-04 DIAGNOSIS — L03115 Cellulitis of right lower limb: Secondary | ICD-10-CM | POA: Diagnosis not present

## 2019-06-04 MED ORDER — DOXYCYCLINE HYCLATE 100 MG PO CAPS: 100.0000 mg | ORAL_CAPSULE | Freq: Two times a day (BID) | ORAL | 0 refills | Status: DC

## 2019-06-04 NOTE — Progress Notes (Signed)
   Subjective:    Patient ID: [name], male    DOB: June 19, 1946, 73 y.o.   MRN: [id number]  HPI Here to follow up cellulitis on the right leg. Last week we added Levaquin to the Doxycycline he was taking, and this has made a big difference. The pain has improved, and the warmth and redness are better. No fevers.    Review of Systems  Constitutional: Negative.   Respiratory: Negative.   Cardiovascular: Negative.   Skin: Positive for wound.       Objective:   Physical Exam Constitutional:      Appearance: Normal appearance.  Cardiovascular:     Rate and Rhythm: Normal rate and regular rhythm.     Pulses: Normal pulses.     Heart sounds: Normal heart sounds.  Pulmonary:     Effort: Pulmonary effort is normal.     Breath sounds: Normal breath sounds.  Skin:    Comments: The wound is closing in and the eschar has become smaller. The skin is not as red and the warmth has resolved.   Neurological:     Mental Status: He is alert.           Assessment & Plan:  Cellulitis, slowly improving. We will continue both antibiotics for another 30 days. Recheck in 30 days.  Alysia Penna, MD

## 2019-06-04 NOTE — Telephone Encounter (Signed)
35m 181.4kg Scr 1.21 05/14/19 Lovw/christopher 05/03/19

## 2019-06-04 NOTE — Telephone Encounter (Signed)
Please review for refill. Thanks!  

## 2019-06-07 DIAGNOSIS — I4891 Unspecified atrial fibrillation: Secondary | ICD-10-CM | POA: Diagnosis not present

## 2019-06-07 DIAGNOSIS — R413 Other amnesia: Secondary | ICD-10-CM | POA: Diagnosis not present

## 2019-06-07 DIAGNOSIS — N390 Urinary tract infection, site not specified: Secondary | ICD-10-CM | POA: Diagnosis not present

## 2019-06-07 DIAGNOSIS — I110 Hypertensive heart disease with heart failure: Secondary | ICD-10-CM | POA: Diagnosis not present

## 2019-06-07 DIAGNOSIS — E119 Type 2 diabetes mellitus without complications: Secondary | ICD-10-CM | POA: Diagnosis not present

## 2019-06-07 DIAGNOSIS — I5022 Chronic systolic (congestive) heart failure: Secondary | ICD-10-CM | POA: Diagnosis not present

## 2019-06-10 DIAGNOSIS — K5792 Diverticulitis of intestine, part unspecified, without perforation or abscess without bleeding: Secondary | ICD-10-CM | POA: Diagnosis not present

## 2019-06-10 DIAGNOSIS — G4733 Obstructive sleep apnea (adult) (pediatric): Secondary | ICD-10-CM | POA: Diagnosis not present

## 2019-06-10 DIAGNOSIS — C8590 Non-Hodgkin lymphoma, unspecified, unspecified site: Secondary | ICD-10-CM | POA: Diagnosis not present

## 2019-06-10 DIAGNOSIS — E785 Hyperlipidemia, unspecified: Secondary | ICD-10-CM | POA: Diagnosis not present

## 2019-06-10 DIAGNOSIS — R51 Headache: Secondary | ICD-10-CM | POA: Diagnosis not present

## 2019-06-10 DIAGNOSIS — Z9861 Coronary angioplasty status: Secondary | ICD-10-CM | POA: Diagnosis not present

## 2019-06-10 DIAGNOSIS — I4891 Unspecified atrial fibrillation: Secondary | ICD-10-CM | POA: Diagnosis not present

## 2019-06-10 DIAGNOSIS — Z9181 History of falling: Secondary | ICD-10-CM | POA: Diagnosis not present

## 2019-06-10 DIAGNOSIS — F329 Major depressive disorder, single episode, unspecified: Secondary | ICD-10-CM | POA: Diagnosis not present

## 2019-06-10 DIAGNOSIS — Z87891 Personal history of nicotine dependence: Secondary | ICD-10-CM | POA: Diagnosis not present

## 2019-06-10 DIAGNOSIS — I110 Hypertensive heart disease with heart failure: Secondary | ICD-10-CM | POA: Diagnosis not present

## 2019-06-10 DIAGNOSIS — I5022 Chronic systolic (congestive) heart failure: Secondary | ICD-10-CM | POA: Diagnosis not present

## 2019-06-10 DIAGNOSIS — R413 Other amnesia: Secondary | ICD-10-CM | POA: Diagnosis not present

## 2019-06-10 DIAGNOSIS — I429 Cardiomyopathy, unspecified: Secondary | ICD-10-CM | POA: Diagnosis not present

## 2019-06-10 DIAGNOSIS — Z7901 Long term (current) use of anticoagulants: Secondary | ICD-10-CM | POA: Diagnosis not present

## 2019-06-10 DIAGNOSIS — Z7984 Long term (current) use of oral hypoglycemic drugs: Secondary | ICD-10-CM | POA: Diagnosis not present

## 2019-06-10 DIAGNOSIS — Z6841 Body Mass Index (BMI) 40.0 and over, adult: Secondary | ICD-10-CM | POA: Diagnosis not present

## 2019-06-10 DIAGNOSIS — E119 Type 2 diabetes mellitus without complications: Secondary | ICD-10-CM | POA: Diagnosis not present

## 2019-06-11 ENCOUNTER — Other Ambulatory Visit: Payer: Self-pay | Admitting: Family Medicine

## 2019-06-12 DIAGNOSIS — I4891 Unspecified atrial fibrillation: Secondary | ICD-10-CM | POA: Diagnosis not present

## 2019-06-12 DIAGNOSIS — C8590 Non-Hodgkin lymphoma, unspecified, unspecified site: Secondary | ICD-10-CM | POA: Diagnosis not present

## 2019-06-12 DIAGNOSIS — I110 Hypertensive heart disease with heart failure: Secondary | ICD-10-CM | POA: Diagnosis not present

## 2019-06-12 DIAGNOSIS — I5022 Chronic systolic (congestive) heart failure: Secondary | ICD-10-CM | POA: Diagnosis not present

## 2019-06-12 DIAGNOSIS — E119 Type 2 diabetes mellitus without complications: Secondary | ICD-10-CM | POA: Diagnosis not present

## 2019-06-12 DIAGNOSIS — R413 Other amnesia: Secondary | ICD-10-CM | POA: Diagnosis not present

## 2019-06-12 NOTE — Telephone Encounter (Signed)
Please advise. Should the patient continue these?

## 2019-06-14 ENCOUNTER — Telehealth: Payer: Self-pay | Admitting: Cardiology

## 2019-06-14 DIAGNOSIS — E119 Type 2 diabetes mellitus without complications: Secondary | ICD-10-CM | POA: Diagnosis not present

## 2019-06-14 DIAGNOSIS — I5022 Chronic systolic (congestive) heart failure: Secondary | ICD-10-CM | POA: Diagnosis not present

## 2019-06-14 DIAGNOSIS — R413 Other amnesia: Secondary | ICD-10-CM | POA: Diagnosis not present

## 2019-06-14 DIAGNOSIS — C8590 Non-Hodgkin lymphoma, unspecified, unspecified site: Secondary | ICD-10-CM | POA: Diagnosis not present

## 2019-06-14 DIAGNOSIS — I110 Hypertensive heart disease with heart failure: Secondary | ICD-10-CM | POA: Diagnosis not present

## 2019-06-14 DIAGNOSIS — Z01812 Encounter for preprocedural laboratory examination: Secondary | ICD-10-CM

## 2019-06-14 DIAGNOSIS — I4891 Unspecified atrial fibrillation: Secondary | ICD-10-CM | POA: Diagnosis not present

## 2019-06-14 NOTE — Telephone Encounter (Signed)
Patient is ready now to have the Cardioversion.   Please  Call to set up.

## 2019-06-17 DIAGNOSIS — R413 Other amnesia: Secondary | ICD-10-CM | POA: Diagnosis not present

## 2019-06-17 DIAGNOSIS — I4891 Unspecified atrial fibrillation: Secondary | ICD-10-CM | POA: Diagnosis not present

## 2019-06-17 DIAGNOSIS — E119 Type 2 diabetes mellitus without complications: Secondary | ICD-10-CM | POA: Diagnosis not present

## 2019-06-17 DIAGNOSIS — I110 Hypertensive heart disease with heart failure: Secondary | ICD-10-CM | POA: Diagnosis not present

## 2019-06-17 DIAGNOSIS — C8590 Non-Hodgkin lymphoma, unspecified, unspecified site: Secondary | ICD-10-CM | POA: Diagnosis not present

## 2019-06-17 DIAGNOSIS — I5022 Chronic systolic (congestive) heart failure: Secondary | ICD-10-CM | POA: Diagnosis not present

## 2019-06-17 NOTE — Telephone Encounter (Signed)
Return call to wife to inform Cardioversion has been scheduled for Friday 06/28/19 at 7:30 with Dr. Stanford Breed and COVID test 06/25/19 at 2:05. Left message to call back.

## 2019-06-18 NOTE — Telephone Encounter (Signed)
With updated with Cardioversion and COVID test date. Wife also informed of cardioversion instructions and voiced understanding.

## 2019-06-20 DIAGNOSIS — E785 Hyperlipidemia, unspecified: Secondary | ICD-10-CM | POA: Diagnosis not present

## 2019-06-20 DIAGNOSIS — Z87891 Personal history of nicotine dependence: Secondary | ICD-10-CM

## 2019-06-20 DIAGNOSIS — Z7901 Long term (current) use of anticoagulants: Secondary | ICD-10-CM

## 2019-06-20 DIAGNOSIS — I4891 Unspecified atrial fibrillation: Secondary | ICD-10-CM | POA: Diagnosis not present

## 2019-06-20 DIAGNOSIS — E119 Type 2 diabetes mellitus without complications: Secondary | ICD-10-CM | POA: Diagnosis not present

## 2019-06-20 DIAGNOSIS — G4733 Obstructive sleep apnea (adult) (pediatric): Secondary | ICD-10-CM | POA: Diagnosis not present

## 2019-06-20 DIAGNOSIS — F329 Major depressive disorder, single episode, unspecified: Secondary | ICD-10-CM | POA: Diagnosis not present

## 2019-06-20 DIAGNOSIS — R413 Other amnesia: Secondary | ICD-10-CM | POA: Diagnosis not present

## 2019-06-20 DIAGNOSIS — Z9181 History of falling: Secondary | ICD-10-CM

## 2019-06-20 DIAGNOSIS — I5022 Chronic systolic (congestive) heart failure: Secondary | ICD-10-CM | POA: Diagnosis not present

## 2019-06-20 DIAGNOSIS — C8590 Non-Hodgkin lymphoma, unspecified, unspecified site: Secondary | ICD-10-CM | POA: Diagnosis not present

## 2019-06-20 DIAGNOSIS — Z7984 Long term (current) use of oral hypoglycemic drugs: Secondary | ICD-10-CM

## 2019-06-20 DIAGNOSIS — Z9861 Coronary angioplasty status: Secondary | ICD-10-CM

## 2019-06-20 DIAGNOSIS — R51 Headache: Secondary | ICD-10-CM | POA: Diagnosis not present

## 2019-06-20 DIAGNOSIS — I429 Cardiomyopathy, unspecified: Secondary | ICD-10-CM | POA: Diagnosis not present

## 2019-06-20 DIAGNOSIS — I110 Hypertensive heart disease with heart failure: Secondary | ICD-10-CM | POA: Diagnosis not present

## 2019-06-20 DIAGNOSIS — K5792 Diverticulitis of intestine, part unspecified, without perforation or abscess without bleeding: Secondary | ICD-10-CM | POA: Diagnosis not present

## 2019-06-20 DIAGNOSIS — Z6841 Body Mass Index (BMI) 40.0 and over, adult: Secondary | ICD-10-CM

## 2019-06-21 ENCOUNTER — Telehealth: Payer: Medicare Other | Admitting: Cardiology

## 2019-06-21 DIAGNOSIS — C8590 Non-Hodgkin lymphoma, unspecified, unspecified site: Secondary | ICD-10-CM | POA: Diagnosis not present

## 2019-06-21 DIAGNOSIS — E119 Type 2 diabetes mellitus without complications: Secondary | ICD-10-CM | POA: Diagnosis not present

## 2019-06-21 DIAGNOSIS — I4891 Unspecified atrial fibrillation: Secondary | ICD-10-CM | POA: Diagnosis not present

## 2019-06-21 DIAGNOSIS — R413 Other amnesia: Secondary | ICD-10-CM | POA: Diagnosis not present

## 2019-06-21 DIAGNOSIS — I110 Hypertensive heart disease with heart failure: Secondary | ICD-10-CM | POA: Diagnosis not present

## 2019-06-21 DIAGNOSIS — I5022 Chronic systolic (congestive) heart failure: Secondary | ICD-10-CM | POA: Diagnosis not present

## 2019-06-25 ENCOUNTER — Other Ambulatory Visit (HOSPITAL_COMMUNITY)
Admission: RE | Admit: 2019-06-25 | Discharge: 2019-06-25 | Disposition: A | Discharge status: Home / Self Care | Payer: Medicare Other | Source: Ambulatory Visit | Attending: Cardiology | Admitting: Cardiology

## 2019-06-25 DIAGNOSIS — Z01812 Encounter for preprocedural laboratory examination: Secondary | ICD-10-CM | POA: Insufficient documentation

## 2019-06-25 DIAGNOSIS — R413 Other amnesia: Secondary | ICD-10-CM | POA: Diagnosis not present

## 2019-06-25 DIAGNOSIS — E119 Type 2 diabetes mellitus without complications: Secondary | ICD-10-CM | POA: Diagnosis not present

## 2019-06-25 DIAGNOSIS — I5022 Chronic systolic (congestive) heart failure: Secondary | ICD-10-CM | POA: Diagnosis not present

## 2019-06-25 DIAGNOSIS — Z20828 Contact with and (suspected) exposure to other viral communicable diseases: Secondary | ICD-10-CM | POA: Insufficient documentation

## 2019-06-25 DIAGNOSIS — C8590 Non-Hodgkin lymphoma, unspecified, unspecified site: Secondary | ICD-10-CM | POA: Diagnosis not present

## 2019-06-25 DIAGNOSIS — I4891 Unspecified atrial fibrillation: Secondary | ICD-10-CM | POA: Diagnosis not present

## 2019-06-25 DIAGNOSIS — I110 Hypertensive heart disease with heart failure: Secondary | ICD-10-CM | POA: Diagnosis not present

## 2019-06-26 LAB — CBC
Hematocrit: 40.3 % (ref 37.5–51.0)
Hemoglobin: 12.9 g/dL — ABNORMAL LOW (ref 13.0–17.7)
MCH: 27.6 pg (ref 26.6–33.0)
MCHC: 32 g/dL (ref 31.5–35.7)
MCV: 86 fL (ref 79–97)
Platelets: 264 10*3/uL (ref 150–450)
RBC: 4.67 x10E6/uL (ref 4.14–5.80)
RDW: 14.6 % (ref 11.6–15.4)
WBC: 7.6 10*3/uL (ref 3.4–10.8)

## 2019-06-26 LAB — BASIC METABOLIC PANEL
BUN/Creatinine Ratio: 21 (ref 10–24)
BUN: 32 mg/dL — ABNORMAL HIGH (ref 8–27)
CO2: 23 mmol/L (ref 20–29)
Calcium: 9.6 mg/dL (ref 8.6–10.2)
Chloride: 99 mmol/L (ref 96–106)
Creatinine, Ser: 1.56 mg/dL — ABNORMAL HIGH (ref 0.76–1.27)
GFR calc Af Amer: 50 mL/min/{1.73_m2} — ABNORMAL LOW (ref >59)
GFR calc non Af Amer: 43 mL/min/{1.73_m2} — ABNORMAL LOW (ref >59)
Glucose: 99 mg/dL (ref 65–99)
Potassium: 4.6 mmol/L (ref 3.5–5.2)
Sodium: 141 mmol/L (ref 134–144)

## 2019-06-26 LAB — NOVEL CORONAVIRUS, NAA (HOSP ORDER, SEND-OUT TO REF LAB; TAT 18-24 HRS): SARS-CoV-2, NAA: NOT DETECTED

## 2019-06-27 ENCOUNTER — Other Ambulatory Visit: Payer: Self-pay | Admitting: Cardiology

## 2019-06-27 ENCOUNTER — Encounter: Payer: Self-pay | Admitting: Family Medicine

## 2019-06-27 DIAGNOSIS — I4891 Unspecified atrial fibrillation: Secondary | ICD-10-CM | POA: Diagnosis not present

## 2019-06-27 DIAGNOSIS — E119 Type 2 diabetes mellitus without complications: Secondary | ICD-10-CM | POA: Diagnosis not present

## 2019-06-27 DIAGNOSIS — R413 Other amnesia: Secondary | ICD-10-CM | POA: Diagnosis not present

## 2019-06-27 DIAGNOSIS — C8590 Non-Hodgkin lymphoma, unspecified, unspecified site: Secondary | ICD-10-CM | POA: Diagnosis not present

## 2019-06-27 DIAGNOSIS — I110 Hypertensive heart disease with heart failure: Secondary | ICD-10-CM | POA: Diagnosis not present

## 2019-06-27 DIAGNOSIS — I5022 Chronic systolic (congestive) heart failure: Secondary | ICD-10-CM | POA: Diagnosis not present

## 2019-06-27 NOTE — Telephone Encounter (Signed)
Set up an in person OV so I can get a good look at the leg

## 2019-06-28 ENCOUNTER — Other Ambulatory Visit: Payer: Self-pay

## 2019-06-28 ENCOUNTER — Ambulatory Visit (HOSPITAL_COMMUNITY)
Admission: RE | Admit: 2019-06-28 | Discharge: 2019-06-28 | Disposition: A | Discharge status: Home / Self Care | Payer: Medicare Other | Attending: Cardiology | Admitting: Cardiology

## 2019-06-28 ENCOUNTER — Ambulatory Visit (HOSPITAL_COMMUNITY): Payer: Medicare Other | Admitting: Certified Registered Nurse Anesthetist

## 2019-06-28 ENCOUNTER — Encounter (HOSPITAL_COMMUNITY)
Admission: RE | Disposition: A | Discharge status: Home / Self Care | Payer: Self-pay | Source: Home / Self Care | Attending: Cardiology

## 2019-06-28 ENCOUNTER — Encounter (HOSPITAL_COMMUNITY): Payer: Self-pay | Admitting: Cardiology

## 2019-06-28 DIAGNOSIS — I5023 Acute on chronic systolic (congestive) heart failure: Secondary | ICD-10-CM | POA: Diagnosis not present

## 2019-06-28 DIAGNOSIS — G4730 Sleep apnea, unspecified: Secondary | ICD-10-CM | POA: Diagnosis not present

## 2019-06-28 DIAGNOSIS — C8590 Non-Hodgkin lymphoma, unspecified, unspecified site: Secondary | ICD-10-CM | POA: Insufficient documentation

## 2019-06-28 DIAGNOSIS — I110 Hypertensive heart disease with heart failure: Secondary | ICD-10-CM | POA: Insufficient documentation

## 2019-06-28 DIAGNOSIS — Z8249 Family history of ischemic heart disease and other diseases of the circulatory system: Secondary | ICD-10-CM | POA: Insufficient documentation

## 2019-06-28 DIAGNOSIS — I4819 Other persistent atrial fibrillation: Secondary | ICD-10-CM

## 2019-06-28 DIAGNOSIS — Z87891 Personal history of nicotine dependence: Secondary | ICD-10-CM | POA: Insufficient documentation

## 2019-06-28 DIAGNOSIS — I5042 Chronic combined systolic (congestive) and diastolic (congestive) heart failure: Secondary | ICD-10-CM | POA: Diagnosis not present

## 2019-06-28 DIAGNOSIS — I428 Other cardiomyopathies: Secondary | ICD-10-CM | POA: Diagnosis not present

## 2019-06-28 DIAGNOSIS — E785 Hyperlipidemia, unspecified: Secondary | ICD-10-CM | POA: Insufficient documentation

## 2019-06-28 DIAGNOSIS — Z7984 Long term (current) use of oral hypoglycemic drugs: Secondary | ICD-10-CM | POA: Diagnosis not present

## 2019-06-28 DIAGNOSIS — E119 Type 2 diabetes mellitus without complications: Secondary | ICD-10-CM | POA: Diagnosis not present

## 2019-06-28 DIAGNOSIS — R600 Localized edema: Secondary | ICD-10-CM | POA: Insufficient documentation

## 2019-06-28 DIAGNOSIS — Z7901 Long term (current) use of anticoagulants: Secondary | ICD-10-CM | POA: Diagnosis not present

## 2019-06-28 DIAGNOSIS — Z79899 Other long term (current) drug therapy: Secondary | ICD-10-CM | POA: Insufficient documentation

## 2019-06-28 DIAGNOSIS — Z7189 Other specified counseling: Secondary | ICD-10-CM | POA: Diagnosis not present

## 2019-06-28 DIAGNOSIS — Z6841 Body Mass Index (BMI) 40.0 and over, adult: Secondary | ICD-10-CM | POA: Diagnosis not present

## 2019-06-28 HISTORY — PX: CARDIOVERSION: SHX1299

## 2019-06-28 LAB — GLUCOSE, CAPILLARY: Glucose-Capillary: 90 mg/dL (ref 70–99)

## 2019-06-28 SURGERY — CARDIOVERSION
Anesthesia: General

## 2019-06-28 MED ORDER — SODIUM CHLORIDE 0.9 % IV SOLN
INTRAVENOUS | Status: DC | PRN
  Administered 2019-06-28: 09:00:00 via INTRAVENOUS

## 2019-06-28 MED ORDER — LIDOCAINE 2% (20 MG/ML) 5 ML SYRINGE
INTRAMUSCULAR | Status: DC | PRN
  Administered 2019-06-28: 40 mg via INTRAVENOUS

## 2019-06-28 MED ORDER — PROPOFOL 10 MG/ML IV BOLUS
INTRAVENOUS | Status: DC | PRN
  Administered 2019-06-28: 20 mg via INTRAVENOUS
  Administered 2019-06-28: 80 mg via INTRAVENOUS

## 2019-06-28 NOTE — Interval H&P Note (Signed)
History and Physical Interval Note:  06/28/2019 8:37 AM  [name]  has presented today for surgery, with the diagnosis of A-FIB.  The various methods of treatment have been discussed with the patient and family. After consideration of risks, benefits and other options for treatment, the patient has consented to  Procedure(s): CARDIOVERSION (N/A) as a surgical intervention.  The patient's history has been reviewed, patient examined, no change in status, stable for surgery.  I have reviewed the patient's chart and labs.  Questions were answered to the patient's satisfaction.     [name]

## 2019-06-28 NOTE — Anesthesia Postprocedure Evaluation (Signed)
Anesthesia Post Note  Patient: [name]  Procedure(s) Performed: CARDIOVERSION (N/A )     Patient location during evaluation: Endoscopy Level of consciousness: awake Pain management: pain level controlled Vital Signs Assessment: post-procedure vital signs reviewed and stable Respiratory status: spontaneous breathing Cardiovascular status: stable Postop Assessment: no apparent nausea or vomiting Anesthetic complications: no    Last Vitals:  Vitals:   06/28/19 0935 06/28/19 0950  BP: (!) 103/54 (!) 103/52  Pulse: (!) 52 (!) 51  Resp: 19 17  Temp:    SpO2: 96% 96%    Last Pain:  Vitals:   06/28/19 0929  TempSrc: Temporal  PainSc:                  Takeyla Million

## 2019-06-28 NOTE — Anesthesia Procedure Notes (Signed)
Procedure Name: MAC Date/Time: 06/28/2019 9:19 AM Performed by: Alain Marion, CRNA Pre-anesthesia Checklist: Patient identified, Emergency Drugs available, Suction available, Patient being monitored and Timeout performed Oxygen Delivery Method: Ambu bag Placement Confirmation: positive ETCO2

## 2019-06-28 NOTE — Procedures (Signed)
Electrical Cardioversion Procedure Note [name] [id number] 05-21-1946  Procedure: Electrical Cardioversion Indications:  Atrial Fibrillation  Procedure Details Consent: Risks of procedure as well as the alternatives and risks of each were explained to the (patient/caregiver).  Consent for procedure obtained. Time Out: Verified patient identification, verified procedure, site/side was marked, verified correct patient position, special equipment/implants available, medications/allergies/relevent history reviewed, required imaging and test results available.  Performed  Patient placed on cardiac monitor, pulse oximetry, supplemental oxygen as necessary.  Sedation given: Pt sedated by anesthesia with lidocaine 40 mg and diprovan 100 mg IV. Pacer pads placed anterior and posterior chest.  Cardioverted 2 time(s).  Cardioverted at 120J and 150J.  Evaluation Findings: Post procedure EKG shows: NSR Complications: None Patient did tolerate procedure well.   [name] 06/28/2019, 8:38 AM

## 2019-06-28 NOTE — Anesthesia Preprocedure Evaluation (Addendum)
Anesthesia Evaluation  Patient identified by MRN, date of birth, ID band Patient awake    Reviewed: Allergy & Precautions, NPO status , Patient's Chart, lab work & pertinent test results  Airway Mallampati: II  TM Distance: >3 FB     Dental   Pulmonary former smoker,    breath sounds clear to auscultation       Cardiovascular hypertension, +CHF   Rhythm:Regular Rate:Normal     Neuro/Psych    GI/Hepatic negative GI ROS, Neg liver ROS,   Endo/Other  diabetes  Renal/GU Renal disease     Musculoskeletal   Abdominal   Peds  Hematology   Anesthesia Other Findings   Reproductive/Obstetrics                            Anesthesia Physical Anesthesia Plan  ASA: III  Anesthesia Plan: General   Post-op Pain Management:    Induction: Intravenous  PONV Risk Score and Plan: Ondansetron, Dexamethasone and Midazolam  Airway Management Planned: Nasal Cannula and Simple Face Mask  Additional Equipment:   Intra-op Plan:   Post-operative Plan:   Informed Consent: I have reviewed the patients History and Physical, chart, labs and discussed the procedure including the risks, benefits and alternatives for the proposed anesthesia with the patient or authorized representative who has indicated his/her understanding and acceptance.     Dental advisory given  Plan Discussed with: CRNA and Anesthesiologist  Anesthesia Plan Comments:         Anesthesia Quick Evaluation

## 2019-06-28 NOTE — Transfer of Care (Signed)
Immediate Anesthesia Transfer of Care Note  Patient: [name]  Procedure(s) Performed: CARDIOVERSION (N/A )  Patient Location: Endoscopy Unit  Anesthesia Type:General  Level of Consciousness: awake, alert  and oriented  Airway & Oxygen Therapy: Patient Spontanous Breathing  Post-op Assessment: Report given to RN and Post -op Vital signs reviewed and stable  Post vital signs: Reviewed and stable  Last Vitals:  Vitals Value Taken Time  BP 103/56 06/28/19 0929  Temp 36.7 C 06/28/19 0929  Pulse 50 06/28/19 0930  Resp 19 06/28/19 0930  SpO2 93 % 06/28/19 0930    Last Pain:  Vitals:   06/28/19 0929  TempSrc: Temporal  PainSc:          Complications: No apparent anesthesia complications

## 2019-06-28 NOTE — H&P (Signed)
Office Visit  05/14/2019 [facility] Marene Lenz, MD Cardiology  Atrial fibrillation, unspecified type ([place]) +6 more Dx  Follow-up ; Referred by [name] Morale, MD Reason for Visit  Additional Documentation  Vitals:   BP 137/78  Pulse 76  Temp 93.2 F (34 C)   Ht 6' 4"  (1.93 m)  Wt 166.9 kg   BMI 44.79 kg/m  BSA 2.99 m  Flowsheets:   MEWS Score,  Anthropometrics,  Method of Visit    Encounter Info:   Billing Info,  History,  Allergies,  Detailed Report    All Notes  Progress Notes by [name] Dresser, MD at 05/14/2019 9:20 AM Author: Buford Dresser, MD Author Type: Physician Filed: 05/17/2019 10:06 PM  Note Status: Signed Cosign: Cosign Not Required Encounter Date: 05/14/2019  Editor: [name] Dresser, MD (Physician)  Expand All Collapse All    Cardiology Office Note:    Date:  05/14/2019   ID:  [name], DOB [birth year], MRN [id number]  PCP:  [name] Morale, MD      Cardiologist:  [name] Dresser, MD PhD  Referring MD: [name] Morale, MD   CC: follow up LE edema  History of Present Illness:    [name] is a 73 y.o. male with a hx of atrial fibrillation, type II diabetes, hypertension, dyslipidemia, previously recovered systolic heart failure (EF 35-40% 2015 to 55-60% 2015) with most recent EF 45-50%.  I met him via video visit 05/03/19, please see notes.  Concerns today: -What is his rhythm today (afib) -weight at home was either 378 or 368 lbs (he can't remember) but is stable to decreasing from his prior. -working on diet, fluid intake at home.   Breathing is still difficult with exertion. Walks with walker or slowly on his own, is very short of breath when he pushes himself. Lies on his side when he is sleeping--initially said flat but family says he is upright in recliner. No PND. Feels like his LE edema is getting better.   No further falls. No bleeding, no melena or  hematochezia. No chest pain.        Past Medical History:  Diagnosis Date  . Chronic systolic CHF (congestive heart failure) ([place])    a. Echo (08/28/13): Mild LVH, EF 35-40%, diffuse HK, moderate to severe LAE.  . Diabetes mellitus without complication ([place])   . Diverticulitis   . Headache(784.0)   . Hyperlipidemia   . Hypertension   . Lymphoma, non Hodgkin's    sees Dr. [name]   . Morbid obesity ([place])   . NICM (nonischemic cardiomyopathy) ([place])    a. R/L Heart cath 08/30/13: RA mean 8, RV 30/0, PA 27/12, mean PCWP 9, CO 5.67, CI 1.98; normal coronary arteries  . Sleep apnea          Past Surgical History:  Procedure Laterality Date  . HERNIA REPAIR     umblical  . incarcerated hernia     vental 11/19/08 Dr. [name]  . LEFT AND RIGHT HEART CATHETERIZATION WITH CORONARY ANGIOGRAM N/A 08/30/2013   Procedure: LEFT AND RIGHT HEART CATHETERIZATION WITH CORONARY ANGIOGRAM;  Surgeon: [name] Hector, MD;  Location: [facility] CATH LAB;  Service: Cardiovascular;  Laterality: N/A;    Current Medications:     Current Outpatient Medications on File Prior to Visit  Medication Sig  . amiodarone (PACERONE) 200 MG tablet Take 1 tablet (200 mg total) by mouth 2 (two) times daily.  Marland Kitchen amLODipine (NORVASC) 5 MG  tablet Take 1 tablet (5 mg total) by mouth daily.  Marland Kitchen apixaban (ELIQUIS) 5 MG TABS tablet Take 1 tablet (5 mg total) by mouth 2 (two) times daily.  Marland Kitchen atorvastatin (LIPITOR) 20 MG tablet Take 1 tablet (20 mg total) by mouth daily.  . clobetasol (TEMOVATE) 0.05 % external solution Apply topically 2 (two) times daily. (Patient taking differently: Apply 1 application topically daily. )  . FLUoxetine (PROZAC) 40 MG capsule Take 1 capsule (40 mg total) by mouth daily.  . furosemide (LASIX) 40 MG tablet Take 1 tablet (40 mg total) by mouth 2 (two) times daily.  . memantine (NAMENDA) 10 MG tablet Take 1 tablet every night for 1 week, then increase to 1 tablet twice a day  (Patient not taking: Reported on 05/03/2019)  . metFORMIN (GLUCOPHAGE) 500 MG tablet TAKE 1 TABLET BY MOUTH TWICE DAILY WITH A MEAL (Patient taking differently: Take 500 mg by mouth 2 (two) times daily with a meal. TAKE 1 TABLET BY MOUTH TWICE DAILY WITH A MEAL)  . metoprolol (TOPROL-XL) 200 MG 24 hr tablet TAKE 1 TABLET BY MOUTH EVERY DAY  . Multiple Vitamins-Minerals (MULTIVITAMIN MEN 50+) TABS Take 1 tablet by mouth daily.  Marland Kitchen olmesartan (BENICAR) 40 MG tablet Take 1 tablet (40 mg total) by mouth daily.  . polyethylene glycol (MIRALAX / GLYCOLAX) packet Take 17 g by mouth daily as needed for moderate constipation.  . potassium chloride (KLOR-CON 10) 10 MEQ tablet Take 1 tablet (10 mEq total) by mouth daily.  Marland Kitchen spironolactone (ALDACTONE) 25 MG tablet TAKE 1 TABLET BY MOUTH EVERY DAY   No current facility-administered medications on file prior to visit.      Allergies:   Patient has no known allergies.   Social History        Socioeconomic History  . Marital status: Married    Spouse name: Not on file  . Number of children: Not on file  . Years of education: Not on file  . Highest education level: Not on file  Occupational History  . Occupation: retired--postal Secretary/administrator: RETIRED  Social Needs  . Financial resource strain: Not on file  . Food insecurity    Worry: Not on file    Inability: Not on file  . Transportation needs    Medical: Not on file    Non-medical: Not on file  Tobacco Use  . Smoking status: Former Smoker    Packs/day: 2.00    Years: 52.00    Pack years: 104.00    Types: Cigarettes    Start date: 08/27/1965    Quit date: 08/28/1999    Years since quitting: 19.7  . Smokeless tobacco: Never Used  . Tobacco comment: quit 13 years ago  Substance and Sexual Activity  . Alcohol use: Yes    Alcohol/week: 0.0 standard drinks    Comment: occ  . Drug use: No  . Sexual activity: Not on file  Lifestyle  . Physical  activity    Days per week: Not on file    Minutes per session: Not on file  . Stress: Not on file  Relationships  . Social Herbalist on phone: Not on file    Gets together: Not on file    Attends religious service: Not on file    Active member of club or organization: Not on file    Attends meetings of clubs or organizations: Not on file    Relationship status: Not on file  Other Topics Concern  . Not on file  Social History Narrative   Lives with wife in two story home      Right handed      Highest level of edu- some college      Retired     Family History: The patient's family history includes Cancer in his father and maternal grandfather; Diabetes in his father and paternal grandfather; Heart failure in his mother; Hypertension in his father and mother; Stroke in his maternal grandmother.  ROS:   Please see the history of present illness.  Additional pertinent ROS:  Constitutional: Negative forchills, fever, night sweats, unintentional weight loss  HENT: Negative forear painand hearing loss.  Eyes: Negative forloss of vision and eye pain.  Respiratory: Negative for cough, sputum, wheezing.  Cardiovascular: See HPI. Gastrointestinal: Negative forabdominal pain,melena, and hematochezia.  Genitourinary: Negative fordysuriaand hematuria.  Musculoskeletal: Negative forfallsand myalgias.  Skin: Negative foritchingand rash.  Neurological: Negative forfocal weakness, focal sensory changesand loss of consciousness.  Endo/Heme/Allergies:Does not bruise/bleed easily.   EKGs/Labs/Other Studies Reviewed:    The following studies were reviewed today: Echo 04/08/19 1. The left ventricle has mildly reduced systolic function, with an ejection fraction of 45-50%. The cavity size was normal. There is moderately increased left ventricular wall thickness. Left ventricular diastolic function could not be evaluated  secondary  to atrial fibrillation. Left ventricular diffuse hypokinesis. 2. Left atrial size was severely dilated. 3. Right atrial size was mildly dilated. 4. The mitral valve is abnormal. Mild thickening of the mitral valve leaflet. 5. The aortic valve is tricuspid. Mild thickening of the aortic valve. Aortic valve regurgitation is trivial by color flow Doppler. 6. There is mild dilatation of the aortic root measuring 43 mm. 7. Extremely limited; definity used; EF difficult to quantitate but appears mildly reduced; moderate LVH; mildly dilated aortic root; trace AI; biatrial enlargement.  EKG:  EKG is personally reviewed.  The ekg ordered today demonstrates atrial fibrillation at 76 bpm, PVC, IVCD  Recent Labs: 04/03/2019: Pro B Natriuretic peptide (BNP) 383.0 04/07/2019: ALT 26; B Natriuretic Peptide 599.2; Magnesium 2.0; TSH 2.079 04/08/2019: BUN 29; Creatinine, Ser 1.07; Hemoglobin 15.7; Platelets 251; Potassium 4.0; Sodium 140  Recent Lipid Panel Labs (Brief)          Component Value Date/Time   CHOL 249 (H) 02/24/2016 0930   TRIG 228.0 (H) 02/24/2016 0930   HDL 47.10 02/24/2016 0930   CHOLHDL 5 02/24/2016 0930   VLDL 45.6 (H) 02/24/2016 0930   LDLCALC 113 (H) 08/28/2013 0032   LDLDIRECT 180.0 02/24/2016 0930      Physical Exam:    VS:  BP 137/78   Pulse 76   Temp (!) 93.2 F (34 C)   Ht 6' 4"  (1.93 m)   Wt (!) 368 lb (166.9 kg)   BMI 44.79 kg/m        Wt Readings from Last 3 Encounters:  05/03/19 (!) 400 lb (181.4 kg)  04/26/19 (!) 400 lb (181.4 kg)  04/07/19 (!) 371 lb 4.1 oz (168.4 kg)     GEN: Well nourished, well developed in no acute distress HEENT: Normal NECK: difficult to visualize JVD due to body habitus LYMPHATICS: No lymphadenopathy CARDIAC: regular rhythm, normal S1 and S2, no murmurs, rubs, gallops. Radial and DP pulses 2+ bilaterally. RESPIRATORY:  Clear to auscultation without rales, wheezing or rhonchi  ABDOMEN: Soft, non-tender,  non-distended MUSCULOSKELETAL:  Bilateral brawny edema with skin discoloration changes. Firm/pitting up to thigh. Crusted lesions, no active weeping seen  SKIN: Warm and dry NEUROLOGIC:  Alert and oriented x 3 PSYCHIATRIC:  Normal affect   ASSESSMENT:    1. Atrial fibrillation, unspecified type ([place])   2. Medication management   3. Chronic combined systolic and diastolic heart failure ([place])   4. NICM (nonischemic cardiomyopathy) ([place])   5. Essential hypertension   6. Chronic edema   7. Counseling on health promotion and disease prevention    PLAN:    Atrial fibrillation:recent diagnosis CHA2DS2/VAS Stroke Risk=4  -we discussed cardioversion today. He is not sure that he wishes to proceed. He will discuss with his family and call to let us know what he decides. Will continue to load with 200 mg BID for now -rate controlled on metoprolol -continue apixaban  Acute on chronic systolic and diastolic dysfunction: diffuse hypokinesis on echo, EF 45-50%.Chronic LE edema and dyspnea on exertion -exam appears consistent with mixed picture of acute edema and chronic edema from venous insufficiency. -reviewed HF education again. This is difficult for him, and he needs assistance. Need to find a way to do daily weights long term to monitor fluid -on metoprolol succinate 200 mg -on olmesartan 40 mg daily -on spironolactone 25 mg daily -continue diuresis with furosemide 40 mg BID with K supplement -check BMET today  Hypertension: slightly elevated in office but reports usually well controlled at home. Continue to monitor.  Cardiac risk counseling and prevention recommendations: -recommend heart healthy/Mediterranean diet, with whole grains, fruits, vegetable, fish, lean meats, nuts, and olive oil. Limit salt. -recommend moderate walking, 3-5 times/week for 30-50 minutes each session. Aim for at least 150 minutes.week. Goal should be pace of 3 miles/hours, or walking 1.5 miles in 30  minutes -recommend avoidance of tobacco products. Avoid excess alcohol.  Plan for follow up: 1 month, may decide on cardioversion sooner  Medication Adjustments/Labs and Tests Ordered: Current medicines are reviewed at length with the patient today.  Concerns regarding medicines are outlined above.     Orders Placed This Encounter  Procedures  . Basic metabolic panel  . EKG 12-Lead   No orders of the defined types were placed in this encounter.   Patient Instructions  Medication Instructions:  Your Physician recommend you continue on your current medication as directed.    If you need a refill on your cardiac medications before your next appointment, please call your pharmacy.   Lab work: Your physician recommends that you return for lab work today (BMP).  If you have labs (blood work) drawn today and your tests are completely normal, you will receive your results only by:  [place] (if you have MyChart) OR  A paper copy in the mail If you have any lab test that is abnormal or we need to change your treatment, we will call you to review the results.  Testing/Procedures: None  Follow-Up: At [facility], you and your health needs are our priority. As part of our continuing mission to provide you with exceptional heart care, we have created designated Provider Care Teams. These Care Teams include your primary Cardiologist (physician) and Advanced Practice Providers (APPs -  Physician Assistants and Nurse Practitioners) who all work together to provide you with the care you need, when you need it.  You will need a virtual follow up appointment in 1 months.  Please call our office 2 months in advance to schedule this appointment.  You may see [name] Dresser, MD or one of the following Advanced Practice Providers on your designated Care Team:  Rosaria Ferries, PA-C  Jory Sims, DNP, ANP       Signed, [name] Dresser, MD PhD  05/14/2019  [place]; no changes; compliant with apixaban. [name]

## 2019-06-30 ENCOUNTER — Encounter (HOSPITAL_COMMUNITY): Payer: Self-pay | Admitting: Cardiology

## 2019-07-01 LAB — POCT I-STAT 4, (NA,K, GLUC, HGB,HCT)
Glucose, Bld: 106 mg/dL — ABNORMAL HIGH (ref 70–99)
HCT: 40 % (ref 39.0–52.0)
Hemoglobin: 13.6 g/dL (ref 13.0–17.0)
Potassium: 4 mmol/L (ref 3.5–5.1)
Sodium: 136 mmol/L (ref 135–145)

## 2019-07-02 ENCOUNTER — Encounter: Payer: Self-pay | Admitting: Family Medicine

## 2019-07-02 ENCOUNTER — Other Ambulatory Visit: Payer: Self-pay

## 2019-07-02 ENCOUNTER — Ambulatory Visit (INDEPENDENT_AMBULATORY_CARE_PROVIDER_SITE_OTHER): Payer: Medicare Other | Admitting: Family Medicine

## 2019-07-02 VITALS — BP 102/60 | HR 40 | Temp 97.0°F | Ht 76.0 in | Wt 357.0 lb

## 2019-07-02 DIAGNOSIS — Z23 Encounter for immunization: Secondary | ICD-10-CM

## 2019-07-02 DIAGNOSIS — L03115 Cellulitis of right lower limb: Secondary | ICD-10-CM | POA: Diagnosis not present

## 2019-07-02 NOTE — Progress Notes (Signed)
   Subjective:    Patient ID: [name], male    DOB: 08-25-1946, 73 y.o.   MRN: [id number]  HPI Here to follow up cellulitis in the right lower leg. This started almost 3 months ago after he fell. This has been slowly resolving but it seems to have hot a plateau the past few weeks. The pain is almost gone. He is currently taking Levaquin and Doxycycline.    Review of Systems  Constitutional: Negative.   Respiratory: Negative.   Cardiovascular: Negative.   Skin: Positive for wound.       Objective:   Physical Exam Constitutional:      Appearance: Normal appearance.  Cardiovascular:     Rate and Rhythm: Normal rate. Rhythm irregular.     Pulses: Normal pulses.     Heart sounds: Normal heart sounds.  Pulmonary:     Effort: Pulmonary effort is normal.     Breath sounds: Normal breath sounds.  Skin:    Comments: Right lower leg has diffuse thickened skin with scaling. No erythema or warmth    Neurological:     Mental Status: He is alert.           Assessment & Plan:  Cellulitis, we will refer him to the [facility] to assist with healing.  Alysia Penna, MD

## 2019-07-03 ENCOUNTER — Ambulatory Visit: Payer: Medicare Other | Admitting: Cardiology

## 2019-07-03 ENCOUNTER — Other Ambulatory Visit: Payer: Self-pay

## 2019-07-03 ENCOUNTER — Ambulatory Visit (INDEPENDENT_AMBULATORY_CARE_PROVIDER_SITE_OTHER): Payer: Medicare Other | Admitting: Cardiology

## 2019-07-03 VITALS — BP 120/60 | HR 61 | Temp 97.6°F | Ht 76.0 in | Wt 357.8 lb

## 2019-07-03 DIAGNOSIS — I5042 Chronic combined systolic (congestive) and diastolic (congestive) heart failure: Secondary | ICD-10-CM | POA: Diagnosis not present

## 2019-07-03 DIAGNOSIS — Z7189 Other specified counseling: Secondary | ICD-10-CM | POA: Diagnosis not present

## 2019-07-03 DIAGNOSIS — R609 Edema, unspecified: Secondary | ICD-10-CM

## 2019-07-03 DIAGNOSIS — I10 Essential (primary) hypertension: Secondary | ICD-10-CM | POA: Diagnosis not present

## 2019-07-03 DIAGNOSIS — I428 Other cardiomyopathies: Secondary | ICD-10-CM

## 2019-07-03 DIAGNOSIS — I480 Paroxysmal atrial fibrillation: Secondary | ICD-10-CM

## 2019-07-03 NOTE — Progress Notes (Signed)
Cardiology Office Note:    Date:  07/03/2019   ID:  [name], DOB 12/27/1945, MRN [id number]  PCP:  [name], MD  Cardiologist:  Buford Dresser, MD PhD  Referring MD: [name], MD   CC: follow up  History of Present Illness:    [name] is a 73 y.o. male with a hx of atrial fibrillation, type II diabetes, hypertension, dyslipidemia, previously recovered systolic heart failure (EF 35-40% 2015 to 55-60% 2015) with most recent EF 45-50%.  I met him via video visit 05/03/19, please see notes.  Today: Still in sinus rhythm from cardioversion 06/28/19. Weight is coming down, down to 357 lbs. Breathing stable, able to walk without rasping breathing today. Still feels dizzy on occasional with walking but feels like he is improving. Walks with walker or sometimes cane. Last major fall 3 mos ago, hit his knee.  Going to wound center for leg wound care.   Still using recliner to keep his legs elevated. No chest pain. No PND. Chronic LE brawny edema. No syncope or palpitations.    Past Medical History:  Diagnosis Date  . Chronic systolic CHF (congestive heart failure) ([name])    a. Echo (08/28/13): Mild LVH, EF 35-40%, diffuse HK, moderate to severe LAE.  . Diabetes mellitus without complication ([name])   . Diverticulitis   . Headache(784.0)   . Hyperlipidemia   . Hypertension   . Lymphoma, non Hodgkin's    sees Dr. [name]   . Morbid obesity ([name])   . NICM (nonischemic cardiomyopathy) ([place])    a. R/L Heart cath 08/30/13: RA mean 8, RV 30/0, PA 27/12, mean PCWP 9, CO 5.67, CI 1.98; normal coronary arteries  . Sleep apnea     Past Surgical History:  Procedure Laterality Date  . CARDIOVERSION N/A 06/28/2019   Procedure: CARDIOVERSION;  Surgeon: Lelon Perla, MD;  Location: [facility] ENDOSCOPY;  Service: Cardiovascular;  Laterality: N/A;  . HERNIA REPAIR     umblical  . incarcerated hernia     vental 11/19/08 Dr. [name]  . LEFT AND RIGHT HEART  CATHETERIZATION WITH CORONARY ANGIOGRAM N/A 08/30/2013   Procedure: LEFT AND RIGHT HEART CATHETERIZATION WITH CORONARY ANGIOGRAM;  Surgeon: [name], MD;  Location: [facility] CATH LAB;  Service: Cardiovascular;  Laterality: N/A;    Current Medications: Current Outpatient Medications on File Prior to Visit  Medication Sig  . amiodarone (PACERONE) 200 MG tablet Take 1 tablet (200 mg total) by mouth 2 (two) times daily.  Marland Kitchen amLODipine (NORVASC) 5 MG tablet Take 1 tablet (5 mg total) by mouth daily.  Marland Kitchen atorvastatin (LIPITOR) 20 MG tablet Take 1 tablet (20 mg total) by mouth daily.  . clobetasol (TEMOVATE) 0.05 % external solution Apply topically 2 (two) times daily. (Patient taking differently: Apply 1 application topically daily as needed (psorisis). )  . doxycycline (VIBRAMYCIN) 100 MG capsule TAKE 1 CAPSULE BY MOUTH TWICE A DAY  . ELIQUIS 5 MG TABS tablet TAKE 1 TABLET BY MOUTH TWICE A DAY  . FLUoxetine (PROZAC) 40 MG capsule Take 1 capsule (40 mg total) by mouth daily.  . furosemide (LASIX) 40 MG tablet Take 1 tablet (40 mg total) by mouth 2 (two) times daily.  Marland Kitchen levofloxacin (LEVAQUIN) 500 MG tablet TAKE 1 TABLET BY MOUTH EVERY DAY  . memantine (NAMENDA) 10 MG tablet TAKE 1 TABLET EVERY NIGHT FOR 1 WEEK, THEN INCREASE TO 1 TABLET TWICE A DAY  . metFORMIN (GLUCOPHAGE) 500 MG tablet TAKE 1  TABLET BY MOUTH TWICE DAILY WITH A MEAL (Patient taking differently: Take 500 mg by mouth 2 (two) times daily with a meal. TAKE 1 TABLET BY MOUTH TWICE DAILY WITH A MEAL)  . metoprolol (TOPROL-XL) 200 MG 24 hr tablet TAKE 1 TABLET BY MOUTH EVERY DAY  . Multiple Vitamins-Minerals (MULTIVITAMIN MEN 50+) TABS Take 1 tablet by mouth daily.  Marland Kitchen olmesartan (BENICAR) 40 MG tablet Take 1 tablet (40 mg total) by mouth daily.  . polyethylene glycol (MIRALAX / GLYCOLAX) packet Take 17 g by mouth daily as needed for moderate constipation.  . potassium chloride (KLOR-CON 10) 10 MEQ tablet Take 1 tablet (10 mEq total) by mouth  daily.  Marland Kitchen spironolactone (ALDACTONE) 25 MG tablet TAKE 1 TABLET BY MOUTH EVERY DAY   No current facility-administered medications on file prior to visit.      Allergies:   Patient has no known allergies.   Social History   Tobacco Use  . Smoking status: Former Smoker    Packs/day: 2.00    Years: 52.00    Pack years: 104.00    Types: Cigarettes    Start date: 08/27/1965    Quit date: 08/28/1999    Years since quitting: 19.9  . Smokeless tobacco: Never Used  . Tobacco comment: quit 13 years ago  Substance Use Topics  . Alcohol use: Yes    Alcohol/week: 0.0 standard drinks    Comment: occ  . Drug use: No    Family History: The patient's family history includes Cancer in his father and maternal grandfather; Diabetes in his father and paternal grandfather; Heart failure in his mother; Hypertension in his father and mother; Stroke in his maternal grandmother.  ROS:   Please see the history of present illness.  Additional pertinent ROS: Constitutional: Negative for chills, fever, night sweats, unintentional weight loss  HENT: Negative for ear pain and hearing loss.   Eyes: Negative for loss of vision and eye pain.  Respiratory: Negative for cough, sputum, wheezing.   Cardiovascular: See HPI. Gastrointestinal: Negative for abdominal pain, melena, and hematochezia.  Genitourinary: Negative for dysuria and hematuria.  Musculoskeletal: Negative for falls and myalgias.  Skin: Negative for itching and rash.  Neurological: Negative for focal weakness, focal sensory changes and loss of consciousness.  Endo/Heme/Allergies: Does not bruise/bleed easily.    EKGs/Labs/Other Studies Reviewed:    The following studies were reviewed today: Echo 04/08/19 1. The left ventricle has mildly reduced systolic function, with an ejection fraction of 45-50%. The cavity size was normal. There is moderately increased left ventricular wall thickness. Left ventricular diastolic function could not be  evaluated  secondary to atrial fibrillation. Left ventricular diffuse hypokinesis. 2. Left atrial size was severely dilated. 3. Right atrial size was mildly dilated. 4. The mitral valve is abnormal. Mild thickening of the mitral valve leaflet. 5. The aortic valve is tricuspid. Mild thickening of the aortic valve. Aortic valve regurgitation is trivial by color flow Doppler. 6. There is mild dilatation of the aortic root measuring 43 mm. 7. Extremely limited; definity used; EF difficult to quantitate but appears mildly reduced; moderate LVH; mildly dilated aortic root; trace AI; biatrial enlargement.  Cath 08/30/2013 Procedural Findings: Hemodynamics RA Mean 8 RV 30/0 PA  27/12 PCWP Mean 9 LV  186/101 AO  177/106  Oxygen saturations: PA 61 AO 94  Cardiac Output (Fick) 5.67  Cardiac Index (Fick) 1.98         Coronary angiography: Coronary dominance: right  Left mainstem: Normal  Left anterior  descending (LAD): Normal             D1:normal             D2: normal             IM: normal  Left circumflex (LCx): normal             OM1: small and normal             OM2 : very large branching artery normal  Right coronary artery (RCA):  Dominant and normal  EKG:  EKG is personally reviewed.  The ekg ordered today demonstrates SR, first degree AV block, PVC, IVCD  Recent Labs: 04/03/2019: Pro B Natriuretic peptide (BNP) 383.0 04/07/2019: ALT 26; B Natriuretic Peptide 599.2; Magnesium 2.0; TSH 2.079 06/25/2019: BUN 32; Creatinine, Ser 1.56; Platelets 264 06/28/2019: Hemoglobin 13.6; Potassium 4.0; Sodium 136  Recent Lipid Panel    Component Value Date/Time   CHOL 249 (H) 02/24/2016 0930   TRIG 228.0 (H) 02/24/2016 0930   HDL 47.10 02/24/2016 0930   CHOLHDL 5 02/24/2016 0930   VLDL 45.6 (H) 02/24/2016 0930   LDLCALC 113 (H) 08/28/2013 0032   LDLDIRECT 180.0 02/24/2016 0930    Physical Exam:    VS:  BP 120/60   Pulse 61   Temp 97.6 F (36.4 C) (Temporal)    Ht _0  (1.93 m)   Wt (!) 357 lb 12.8 oz (162.3 kg)   SpO2 98%   BMI 43.55 kg/m     Wt Readings from Last 3 Encounters:  07/03/19 (!) 357 lb 12.8 oz (162.3 kg)  07/02/19 (!) 357 lb (161.9 kg)  06/28/19 (!) 360 lb (163.3 kg)    GEN: Well nourished, well developed in no acute distress HEENT: Normal, moist mucous membranes NECK: No JVD visible but difficult due to body habitus CARDIAC: regular rhythm, normal S1 and S2, no murmurs, rubs, gallops.  VASCULAR: Radial and DP pulses 2+ bilaterally. No carotid bruits RESPIRATORY:  Clear to auscultation without rales, wheezing or rhonchi  ABDOMEN: Soft, non-tender, non-distended MUSCULOSKELETAL:  Ambulates independently SKIN: bilateral brawny edema with chronic skin discoloration. Firm/pitting to thigh. Crusted lesions, no active weeping NEUROLOGIC:  Alert and oriented x 3. No focal neuro deficits noted. PSYCHIATRIC:  Normal affect   ASSESSMENT:    1. Paroxysmal atrial fibrillation (HCC)   2. Chronic combined systolic and diastolic heart failure ([place])   3. NICM (nonischemic cardiomyopathy) ([place])   4. Essential hypertension   5. Chronic edema   6. Counseling on health promotion and disease prevention   7. Encounter for education about heart failure    PLAN:    Atrial fibrillation: paroxysmal CHA2DS2/VAS Stroke Risk= 4  -has remained in SR since cardioversion 06/28/19 -continue amiodarone 200 mg BID. Recheck LFTs, TFTs and get PFTs at follow up. Consider dropping back to daily at follow up if he remains in SR. -rate controlled on metoprolol -continue apixaban 5 mg BID  Chronic systolic and diastolic dysfunction: diffuse hypokinesis on echo, EF 45-50%. Chronic LE edema and dyspnea on exertion. Nonischemic cardiomyopathy based on cath 2014 at time of diagnosis. -continued HF education. This is difficult for him, and he needs assistance. Counseled on daily weights, fluid, salt avoidance -on metoprolol succinate 200 mg -on olmesartan 40  mg daily -on spironolactone 25 mg daily -continue chronic diuresis with furosemide 40 mg BID with K supplement  Hypertension: at goal today, meds as above  Cardiac risk counseling and prevention recommendations: -recommend heart healthy/Mediterranean diet, with  whole grains, fruits, vegetable, fish, lean meats, nuts, and olive oil. Limit salt. -recommend moderate walking, 3-5 times/week for 30-50 minutes each session. Aim for at least 150 minutes.week. Goal should be pace of 3 miles/hours, or walking 1.5 miles in 30 minutes -recommend avoidance of tobacco products. Avoid excess alcohol.  Plan for follow up: 3 mos or sooner PRN  Medication Adjustments/Labs and Tests Ordered: Current medicines are reviewed at length with the patient today.  Concerns regarding medicines are outlined above.  Orders Placed This Encounter  Procedures  . EKG 12-Lead   No orders of the defined types were placed in this encounter.   Patient Instructions  Medication Instructions:  Your Physician recommend you continue on your current medication as directed.    If you need a refill on your cardiac medications before your next appointment, please call your pharmacy.   Lab work: None  Testing/Procedures: None  Follow-Up: At [facility], you and your health needs are our priority.  As part of our continuing mission to provide you with exceptional heart care, we have created designated Provider Care Teams.  These Care Teams include your primary Cardiologist (physician) and Advanced Practice Providers (APPs -  Physician Assistants and Nurse Practitioners) who all work together to provide you with the care you need, when you need it. You will need a follow up appointment in 3 months.  Please call our office 2 months in advance to schedule this appointment.  You may see Buford Dresser, MD or one of the following Advanced Practice Providers on your designated Care Team:   Rosaria Ferries, PA-C . Jory Sims, DNP, ANP      Signed, Buford Dresser, MD PhD 07/03/2019  [place]

## 2019-07-03 NOTE — Patient Instructions (Signed)

## 2019-07-04 ENCOUNTER — Telehealth: Payer: Self-pay | Admitting: Cardiology

## 2019-07-04 DIAGNOSIS — E119 Type 2 diabetes mellitus without complications: Secondary | ICD-10-CM | POA: Diagnosis not present

## 2019-07-04 DIAGNOSIS — I5022 Chronic systolic (congestive) heart failure: Secondary | ICD-10-CM | POA: Diagnosis not present

## 2019-07-04 DIAGNOSIS — I110 Hypertensive heart disease with heart failure: Secondary | ICD-10-CM | POA: Diagnosis not present

## 2019-07-04 DIAGNOSIS — C8590 Non-Hodgkin lymphoma, unspecified, unspecified site: Secondary | ICD-10-CM | POA: Diagnosis not present

## 2019-07-04 DIAGNOSIS — R413 Other amnesia: Secondary | ICD-10-CM | POA: Diagnosis not present

## 2019-07-04 DIAGNOSIS — I4891 Unspecified atrial fibrillation: Secondary | ICD-10-CM | POA: Diagnosis not present

## 2019-07-04 NOTE — Telephone Encounter (Signed)
LVM for patient to call and schedule a 3 month followup with Dr. [name].

## 2019-07-05 ENCOUNTER — Telehealth: Payer: Self-pay | Admitting: Cardiology

## 2019-07-05 NOTE — Telephone Encounter (Signed)
LVM for patient to call and schedule 3 month followup with Dr. [name].

## 2019-07-18 ENCOUNTER — Encounter (HOSPITAL_BASED_OUTPATIENT_CLINIC_OR_DEPARTMENT_OTHER): Payer: Medicare Other | Attending: Internal Medicine | Admitting: Internal Medicine

## 2019-07-18 ENCOUNTER — Other Ambulatory Visit: Payer: Self-pay

## 2019-07-18 DIAGNOSIS — I5040 Unspecified combined systolic (congestive) and diastolic (congestive) heart failure: Secondary | ICD-10-CM | POA: Diagnosis not present

## 2019-07-18 DIAGNOSIS — W19XXXA Unspecified fall, initial encounter: Secondary | ICD-10-CM | POA: Diagnosis not present

## 2019-07-18 DIAGNOSIS — G4730 Sleep apnea, unspecified: Secondary | ICD-10-CM | POA: Diagnosis not present

## 2019-07-18 DIAGNOSIS — L97812 Non-pressure chronic ulcer of other part of right lower leg with fat layer exposed: Secondary | ICD-10-CM | POA: Insufficient documentation

## 2019-07-18 DIAGNOSIS — Z87891 Personal history of nicotine dependence: Secondary | ICD-10-CM | POA: Diagnosis not present

## 2019-07-18 DIAGNOSIS — Z9221 Personal history of antineoplastic chemotherapy: Secondary | ICD-10-CM | POA: Insufficient documentation

## 2019-07-18 DIAGNOSIS — S8001XA Contusion of right knee, initial encounter: Secondary | ICD-10-CM | POA: Diagnosis not present

## 2019-07-18 DIAGNOSIS — L97212 Non-pressure chronic ulcer of right calf with fat layer exposed: Secondary | ICD-10-CM | POA: Diagnosis not present

## 2019-07-18 DIAGNOSIS — E1140 Type 2 diabetes mellitus with diabetic neuropathy, unspecified: Secondary | ICD-10-CM | POA: Diagnosis not present

## 2019-07-18 DIAGNOSIS — I890 Lymphedema, not elsewhere classified: Secondary | ICD-10-CM | POA: Insufficient documentation

## 2019-07-18 DIAGNOSIS — I87323 Chronic venous hypertension (idiopathic) with inflammation of bilateral lower extremity: Secondary | ICD-10-CM | POA: Diagnosis not present

## 2019-07-18 DIAGNOSIS — S81011A Laceration without foreign body, right knee, initial encounter: Secondary | ICD-10-CM | POA: Diagnosis not present

## 2019-07-18 DIAGNOSIS — E11622 Type 2 diabetes mellitus with other skin ulcer: Secondary | ICD-10-CM | POA: Diagnosis not present

## 2019-07-18 DIAGNOSIS — Z7984 Long term (current) use of oral hypoglycemic drugs: Secondary | ICD-10-CM | POA: Insufficient documentation

## 2019-07-18 DIAGNOSIS — Z8572 Personal history of non-Hodgkin lymphomas: Secondary | ICD-10-CM | POA: Diagnosis not present

## 2019-07-18 DIAGNOSIS — I87331 Chronic venous hypertension (idiopathic) with ulcer and inflammation of right lower extremity: Secondary | ICD-10-CM | POA: Diagnosis not present

## 2019-07-18 DIAGNOSIS — I110 Hypertensive heart disease with heart failure: Secondary | ICD-10-CM | POA: Insufficient documentation

## 2019-07-22 ENCOUNTER — Encounter: Payer: Self-pay | Admitting: Cardiology

## 2019-07-22 DIAGNOSIS — R4182 Altered mental status, unspecified: Secondary | ICD-10-CM | POA: Diagnosis not present

## 2019-07-22 DIAGNOSIS — L97811 Non-pressure chronic ulcer of other part of right lower leg limited to breakdown of skin: Secondary | ICD-10-CM | POA: Diagnosis not present

## 2019-07-22 DIAGNOSIS — G319 Degenerative disease of nervous system, unspecified: Secondary | ICD-10-CM | POA: Diagnosis not present

## 2019-07-22 DIAGNOSIS — Z7901 Long term (current) use of anticoagulants: Secondary | ICD-10-CM | POA: Diagnosis not present

## 2019-07-22 DIAGNOSIS — G4733 Obstructive sleep apnea (adult) (pediatric): Secondary | ICD-10-CM | POA: Diagnosis not present

## 2019-07-22 DIAGNOSIS — L03115 Cellulitis of right lower limb: Secondary | ICD-10-CM | POA: Diagnosis not present

## 2019-07-22 DIAGNOSIS — F329 Major depressive disorder, single episode, unspecified: Secondary | ICD-10-CM | POA: Diagnosis not present

## 2019-07-22 DIAGNOSIS — I444 Left anterior fascicular block: Secondary | ICD-10-CM | POA: Diagnosis not present

## 2019-07-22 DIAGNOSIS — M25561 Pain in right knee: Secondary | ICD-10-CM | POA: Diagnosis not present

## 2019-07-22 DIAGNOSIS — I482 Chronic atrial fibrillation, unspecified: Secondary | ICD-10-CM | POA: Diagnosis not present

## 2019-07-22 DIAGNOSIS — I87331 Chronic venous hypertension (idiopathic) with ulcer and inflammation of right lower extremity: Secondary | ICD-10-CM | POA: Diagnosis not present

## 2019-07-22 DIAGNOSIS — I110 Hypertensive heart disease with heart failure: Secondary | ICD-10-CM | POA: Diagnosis not present

## 2019-07-22 DIAGNOSIS — E785 Hyperlipidemia, unspecified: Secondary | ICD-10-CM | POA: Diagnosis not present

## 2019-07-22 DIAGNOSIS — R32 Unspecified urinary incontinence: Secondary | ICD-10-CM | POA: Diagnosis not present

## 2019-07-22 DIAGNOSIS — E1140 Type 2 diabetes mellitus with diabetic neuropathy, unspecified: Secondary | ICD-10-CM | POA: Diagnosis not present

## 2019-07-22 DIAGNOSIS — L97211 Non-pressure chronic ulcer of right calf limited to breakdown of skin: Secondary | ICD-10-CM | POA: Diagnosis not present

## 2019-07-22 DIAGNOSIS — G9341 Metabolic encephalopathy: Secondary | ICD-10-CM | POA: Diagnosis not present

## 2019-07-22 DIAGNOSIS — I890 Lymphedema, not elsewhere classified: Secondary | ICD-10-CM | POA: Diagnosis not present

## 2019-07-22 DIAGNOSIS — I5022 Chronic systolic (congestive) heart failure: Secondary | ICD-10-CM | POA: Diagnosis not present

## 2019-07-22 DIAGNOSIS — I429 Cardiomyopathy, unspecified: Secondary | ICD-10-CM | POA: Diagnosis not present

## 2019-07-22 DIAGNOSIS — I5030 Unspecified diastolic (congestive) heart failure: Secondary | ICD-10-CM | POA: Diagnosis not present

## 2019-07-22 DIAGNOSIS — I87322 Chronic venous hypertension (idiopathic) with inflammation of left lower extremity: Secondary | ICD-10-CM | POA: Diagnosis not present

## 2019-07-22 DIAGNOSIS — S81011D Laceration without foreign body, right knee, subsequent encounter: Secondary | ICD-10-CM | POA: Diagnosis not present

## 2019-07-22 DIAGNOSIS — Z6841 Body Mass Index (BMI) 40.0 and over, adult: Secondary | ICD-10-CM | POA: Diagnosis not present

## 2019-07-22 NOTE — Progress Notes (Signed)
[name], [name] ([id number]) Visit Report for 07/18/2019 Allergy List Details Patient Name: Date of Service: [name], [name] 07/18/2019 1:15 PM Medical Record [id number] Patient Account Number: [id number] Date of Birth/Sex: Treating RN: 1946/09/23 (73 y.o. Janyth Contes Primary Care Tracer Gutridge: [name] Other Clinician: Referring Jermie Hippe: Treating Keaunna Skipper/Extender:Robson, [name], [name] in Treatment: 0 Allergies Active Allergies No Known Drug Allergies Allergy Notes Electronic Signature(s) Signed: 07/22/2019 6:17:12 PM By: Levan Hurst RN, BSN Entered By: Levan Hurst on 07/18/2019 13:28:34 -------------------------------------------------------------------------------- Arrival Information Details Patient Name: Date of Service: [name] 07/18/2019 1:15 PM Medical Record [id number] Patient Account Number: [id number] Date of Birth/Sex: Treating RN: 03/05/1946 (73 y.o. Janyth Contes Primary Care Emileigh Kellett: [name] Other Clinician: Referring Corian Handley: Treating Ricki Clack/Extender:Robson, [name], [name] in Treatment: 0 Visit Information Patient Arrived: [name] Arrival Time: 13:25 Accompanied By: wife Transfer Assistance: None Patient Identification Verified: Yes Secondary Verification Process Yes Completed: Patient Requires Transmission- No Based Precautions: Patient Has Alerts: Yes Patient Alerts: Patient on Blood Thinner Electronic Signature(s) Signed: 07/22/2019 6:17:12 PM By: Levan Hurst RN, BSN Entered By: Levan Hurst on 07/18/2019 13:26:44 -------------------------------------------------------------------------------- Clinic Level of Care Assessment Details Patient Name: Date of Service: [name], [name] 07/18/2019 1:15 PM Medical Record [id number] Patient Account Number: [id number] Date of Birth/Sex: Treating RN: 1946/02/06 (73 y.o. [name] Primary Care Jaymison Luber:  [name] Other Clinician: Referring Rhayne Chatwin: Treating Paxten Appelt/Extender:Robson, [name], [name] in Treatment: 0 Clinic Level of Care Assessment Items TOOL 1 Quantity Score X - Use when EandM and Procedure is performed on INITIAL visit 1 0 ASSESSMENTS - Nursing Assessment / Reassessment X - General Physical Exam (combine w/ comprehensive assessment (listed just below) 1 20 when performed on new pt. evals) X - Comprehensive Assessment (HX, ROS, Risk Assessments, Wounds Hx, etc.) 1 25 ASSESSMENTS - Wound and Skin Assessment / Reassessment X - Dermatologic / Skin Assessment (not related to wound area) 1 10 ASSESSMENTS - Ostomy and/or Continence Assessment and Care []  - Incontinence Assessment and Management 0 []  - Ostomy Care Assessment and Management (repouching, etc.) 0 PROCESS - Coordination of Care []  - Simple Patient / Family Education for ongoing care 0 X - Complex (extensive) Patient / Family Education for ongoing care 1 20 X - Staff obtains Programmer, systems, Records, Test Results / Process Orders 1 10 X - Staff telephones HHA, Nursing Homes / Clarify orders / etc 1 10 []  - Routine Transfer to another Facility (non-emergent condition) 0 []  - Routine Hospital Admission (non-emergent condition) 0 X - New Admissions / Biomedical engineer / Ordering NPWT, Apligraf, etc. 1 15 []  - Emergency Hospital Admission (emergent condition) 0 PROCESS - Special Needs []  - Pediatric / Minor Patient Management 0 []  - Isolation Patient Management 0 []  - Hearing / Language / Visual special needs 0 []  - Assessment of Community assistance (transportation, D/C planning, etc.) 0 []  - Additional assistance / Altered mentation 0 []  - Support Surface(s) Assessment (bed, cushion, seat, etc.) 0 INTERVENTIONS - Miscellaneous []  - External ear exam 0 []  - Patient Transfer (multiple staff / Civil Service fast streamer / Similar devices) 0 []  - Simple Staple / Suture removal (25 or less) 0 []  - Complex Staple  / Suture removal (26 or more) 0 []  - Hypo/Hyperglycemic Management (do not check if billed separately) 0 X - Ankle / Brachial Index (ABI) - do not check if billed separately 1 15 Has the patient been seen at the hospital within the last three  years: Yes Total Score: 125 Level Of Care: New/Established - Level 4 Electronic Signature(s) Signed: 07/18/2019 6:31:45 PM By: Deon Pilling Entered By: Deon Pilling on 07/18/2019 14:53:10 -------------------------------------------------------------------------------- Compression Therapy Details Patient Name: Date of Service: [name], [name] 07/18/2019 1:15 PM Medical Record [id number] Patient Account Number: [id number] Date of Birth/Sex: Treating RN: 27-Jan-1946 (73 y.o. [name] Primary Care Lissett Favorite: [name] Other Clinician: Referring Cannie Muckle: Treating Etai Copado/Extender:Robson, [name], [name] in Treatment: 0 Compression Therapy Performed for Wound Wound #2 Right,Proximal,Anterior Lower Leg Assessment: Performed By: Clinician Baruch Gouty, RN Compression Type: Four Layer Pre Treatment ABI: 1.1 Post Procedure Diagnosis Same as Pre-procedure Electronic Signature(s) Signed: 07/18/2019 6:31:45 PM By: Deon Pilling Entered By: Deon Pilling on 07/18/2019 14:44:44 -------------------------------------------------------------------------------- Compression Therapy Details Patient Name: Date of Service: [name], [name] 07/18/2019 1:15 PM Medical Record [id number] Patient Account Number: [id number] Date of Birth/Sex: Treating RN: 28-May-1946 (73 y.o. [name] Primary Care Kiowa Peifer: [name] Other Clinician: Referring Najir Roop: Treating Ginni Eichler/Extender:Robson, [name], [name] in Treatment: 0 Compression Therapy Performed for Wound Wound #1 Right,Medial Knee Assessment: Performed By: Clinician Baruch Gouty, RN Compression Type: Four Layer Pre Treatment ABI:  1.1 Post Procedure Diagnosis Same as Pre-procedure Electronic Signature(s) Signed: 07/18/2019 6:31:45 PM By: Deon Pilling Entered By: Deon Pilling on 07/18/2019 14:44:44 -------------------------------------------------------------------------------- Compression Therapy Details Patient Name: Date of Service: [name], [name] 07/18/2019 1:15 PM Medical Record [id number] Patient Account Number: [id number] Date of Birth/Sex: Treating RN: 03-Dec-1945 (73 y.o. [name] Primary Care Hilari Wethington: [name] Other Clinician: Referring Psalms Olarte: Treating Senay Sistrunk/Extender:Robson, [name], [name] in Treatment: 0 Compression Therapy Performed for Wound Wound #3 Right,Distal,Anterior Lower Leg Assessment: Performed By: Clinician Baruch Gouty, RN Compression Type: Four Layer Pre Treatment ABI: 1.1 Post Procedure Diagnosis Same as Pre-procedure Electronic Signature(s) Signed: 07/18/2019 6:31:45 PM By: Deon Pilling Entered By: Deon Pilling on 07/18/2019 14:44:44 -------------------------------------------------------------------------------- Compression Therapy Details Patient Name: Date of Service: [name], [name] 07/18/2019 1:15 PM Medical Record [id number] Patient Account Number: [id number] Date of Birth/Sex: Treating RN: [birth year] (73 y.o. [name] Primary Care Mariamawit Depaoli: [name] Other Clinician: Referring Alixandria Friedt: Treating Sayda Grable/Extender:Robson, [name], [name] in Treatment: 0 Compression Therapy Performed for Wound Wound #4 Right,Proximal,Lateral Lower Leg Assessment: Performed By: Clinician Baruch Gouty, RN Compression Type: Four Layer Pre Treatment ABI: 1.1 Post Procedure Diagnosis Same as Pre-procedure Electronic Signature(s) Signed: 07/18/2019 6:31:45 PM By: Deon Pilling Entered By: Deon Pilling on 07/18/2019  14:44:44 -------------------------------------------------------------------------------- Compression Therapy Details Patient Name: Date of Service: [name], [name] 07/18/2019 1:15 PM Medical Record [id number] Patient Account Number: [id number] Date of Birth/Sex: Treating RN: 25-May-1946 (73 y.o. [name] Primary Care Alison Breeding: [name] Other Clinician: Referring Tahmid Stonehocker: Treating Jashae Wiggs/Extender:Robson, [name], [name] in Treatment: 0 Compression Therapy Performed for Wound Wound #5 Right,Lateral Lower Leg Assessment: Performed By: Clinician Baruch Gouty, RN Compression Type: Four Layer Pre Treatment ABI: 1.1 Post Procedure Diagnosis Same as Pre-procedure Electronic Signature(s) Signed: 07/18/2019 6:31:45 PM By: Deon Pilling Entered By: Deon Pilling on 07/18/2019 14:44:45 -------------------------------------------------------------------------------- Compression Therapy Details Patient Name: Date of Service: [name], [name] 07/18/2019 1:15 PM Medical Record [id number] Patient Account Number: [id number] Date of Birth/Sex: Treating RN: June 05, 1946 (73 y.o. [name] Primary Care Jaiyla Granados: [name] Other Clinician: Referring Aziza Stuckert: Treating Meher Kucinski/Extender:Robson, [name], [name] in Treatment: 0 Compression Therapy Performed for Wound Wound #6 Right,Distal Lower Leg Assessment: Performed By: Clinician Baruch Gouty, RN Compression  Type: Four Layer Pre Treatment ABI: 1.1 Post Procedure Diagnosis Same as Pre-procedure Electronic Signature(s) Signed: 07/18/2019 6:31:45 PM By: Deon Pilling Entered By: Deon Pilling on 07/18/2019 14:44:45 -------------------------------------------------------------------------------- Compression Therapy Details Patient Name: Date of Service: [name], [name] 07/18/2019 1:15 PM Medical Record [id number] Patient Account Number: [id number] Date of  Birth/Sex: Treating RN: Feb 17, 1946 (73 y.o. [name] Primary Care Helton Oleson: [name] Other Clinician: Referring Mel Tadros: Treating Anush Wiedeman/Extender:Robson, [name], [name] in Treatment: 0 Compression Therapy Performed for Wound Wound #7 Right,Posterior Lower Leg Assessment: Performed By: Clinician Baruch Gouty, RN Compression Type: Four Layer Pre Treatment ABI: 1.1 Post Procedure Diagnosis Same as Pre-procedure Electronic Signature(s) Signed: 07/18/2019 6:31:45 PM By: Deon Pilling Entered By: Deon Pilling on 07/18/2019 14:44:46 -------------------------------------------------------------------------------- Compression Therapy Details Patient Name: Date of Service: [name], [name] 07/18/2019 1:15 PM Medical Record [id number] Patient Account Number: [id number] Date of Birth/Sex: Treating RN: [birth year] (73 y.o. [name] Primary Care Remingtyn Depaola: [name] Other Clinician: Referring Davis Ambrosini: Treating Renny Remer/Extender:Robson, [name], [name] in Treatment: 0 Compression Therapy Performed for Wound Non-Wound Location Assessment: Performed By: Clinician Deon Pilling, RN Compression Type: Four Layer Location: Lower Extremity, Left Post Procedure Diagnosis Same as Pre-procedure Electronic Signature(s) Signed: 07/18/2019 6:31:45 PM By: Deon Pilling Entered By: Deon Pilling on 07/18/2019 14:44:59 -------------------------------------------------------------------------------- Encounter Discharge Information Details Patient Name: Date of Service: [name] 07/18/2019 1:15 PM Medical Record [id number] Patient Account Number: [id number] Date of Birth/Sex: Treating RN: 08-15-1946 (73 y.o. Ernestene Mention Primary Care Lucee Brissett: [name] Other Clinician: Referring Hudson Majkowski: Treating Kimmie Berggren/Extender:Robson, [name], [name] in Treatment: 0 Encounter Discharge Information Items  Post Procedure Vitals Discharge Condition: Stable Temperature (F): 98.3 Ambulatory Status: Walker Pulse (bpm): 56 Discharge Destination: Home Respiratory Rate (breaths/min): 18 Transportation: Private Auto Blood Pressure (mmHg): 132/52 Accompanied By: spouse Schedule Follow-up Appointment: Yes Clinical Summary of Care: Electronic Signature(s) Signed: 07/19/2019 7:05:19 PM By: Baruch Gouty RN, BSN Entered By: Baruch Gouty on 07/18/2019 15:43:25 -------------------------------------------------------------------------------- Lower Extremity Assessment Details Patient Name: Date of Service: [name], [name] 07/18/2019 1:15 PM Medical Record [id number] Patient Account Number: [id number] Date of Birth/Sex: Treating RN: 1946/06/19 (73 y.o. Janyth Contes Primary Care Azaliah Carrero: [name] Other Clinician: Referring Adriana Lina: Treating Chester Sibert/Extender:Robson, [name], [name] in Treatment: 0 Edema Assessment Assessed: [Left: No] [Right: No] Edema: [Left: Ye] [Right: s] Calf Left: Right: Point of Measurement: 34 cm From Medial Instep cm 53.5 cm Ankle Left: Right: Point of Measurement: 13 cm From Medial Instep cm 30 cm Vascular Assessment Blood Pressure: Brachial: [Right:132] Ankle: [Right:Dorsalis Pedis: 150 1.14] Electronic Signature(s) Signed: 07/22/2019 6:17:12 PM By: Levan Hurst RN, BSN Entered By: Levan Hurst on 07/18/2019 14:15:58 -------------------------------------------------------------------------------- Multi Wound Chart Details Patient Name: Date of Service: [name] 07/18/2019 1:15 PM Medical Record [id number] Patient Account Number: [id number] Date of Birth/Sex: Treating RN: Apr 23, 1946 (73 y.o. [name] Primary Care Kathrina Crosley: [name] Other Clinician: Referring Jaheim Canino: Treating Gasper Hopes/Extender:Robson, [name], [name] in Treatment: 0 Photos: [1:No Photos] [2:No Photos] [3:No  Photos] Wound Location: [1:Right Knee - Medial] [2:Right Lower Leg - Anterior, Right Lower Leg - Anterior, Proximal] [3:Distal] Wounding Event: [1:Blister] [2:Blister] [3:Blister] Primary Etiology: [1:Trauma, Other] [2:Venous Leg Ulcer] [3:Venous Leg Ulcer] Comorbid History: [1:Sleep Apnea, Congestive Sleep Apnea, Congestive Sleep Apnea, Congestive Heart Failure, Hypertension, Heart Failure, Hypertension, Heart Failure, Hypertension, Type II Diabetes, Neuropathy, Received Chemotherapy] [2:Type II Diabetes,  Neuropathy, Received Chemotherapy] [3:Type II Diabetes, Neuropathy, Received  Chemotherapy] Date Acquired: [1:05/01/2019] [2:05/01/2019] [3:05/01/2019] Weeks of Treatment: [1:0] [2:0] [3:0] Wound Status: [1:Open] [2:Open] [3:Open] Clustered Wound: [1:No] [2:Yes] [3:No] Clustered Quantity: [1:N/A] [2:2] [3:N/A] Measurements L x W x D 5.6x6.8x0.1 [2:1.2x3.6x0.1] [3:2x1.3x0.1] (cm) Area (cm) : [1:29.908] [2:3.393] [3:2.042] Volume (cm) : [1:2.991] [2:0.339] [3:0.204] Classification: [1:Unclassifiable] [2:Full Thickness Without Exposed Support Structures Exposed Support Structures] [3:Full Thickness Without] Exudate Amount: [1:Medium] [2:Small] [3:Medium] Exudate Type: [1:N/A] [2:Serous] [3:Serous] Exudate Color: [1:N/A] [2:amber] [3:amber] Wound Margin: [1:Flat and Intact] [2:Flat and Intact] [3:Flat and Intact] Granulation Amount: [1:None Present (0%)] [2:Large (67-100%)] [3:Large (67-100%)] Granulation Quality: [1:N/A] [2:Red] [3:Pink] Necrotic Amount: [1:Large (67-100%)] [2:None Present (0%)] [3:None Present (0%)] Necrotic Tissue: [1:Eschar] [2:N/A] [3:N/A] Exposed Structures: [1:Fascia: No Fat Layer (Subcutaneous Tissue) Exposed: Yes Tissue) Exposed: No Tendon: No Muscle: No Joint: No Bone: No] [2:Fat Layer (Subcutaneous Fat Layer (Subcutaneous Fascia: No Tendon: No Muscle: No Joint: No Bone: No] [3:Tissue) Exposed: Yes  Fascia: No Tendon: No Muscle: No Joint: No Bone:  No] Epithelialization: [1:None] [2:None] [3:None] Debridement: [1:Debridement - Selective/Open Wound] [2:N/A] [3:N/A] Pre-procedure [1:14:40] [2:N/A] [3:N/A] Verification/Time Out Taken: Pain Control: [1:Lidocaine 4% Topical Solution] [2:N/A] [3:N/A] Tissue Debrided: [1:Necrotic/Eschar, Slough] [2:N/A] [3:N/A] Level: [1:Non-Viable Tissue] [2:N/A] [3:N/A] Debridement Area (sq cm):38.08 [2:N/A] [3:N/A] Instrument: [1:Curette] [2:N/A] [3:N/A] Bleeding: [1:None] [2:N/A] [3:N/A] Hemostasis Achieved: [1:Pressure] [2:N/A] [3:N/A] Procedural Pain: [1:0] [2:N/A] [3:N/A] Post Procedural Pain: [1:0] [2:N/A] [3:N/A] Debridement Treatment Procedure was tolerated [2:N/A] [3:N/A] Response: [1:well] Post Debridement [1:5.6x6.8x0.1] [2:N/A] [3:N/A] Measurements L x W x D (cm) Post Debridement [1:2.991] [2:N/A] [3:N/A] Volume: (cm) Procedures Performed: Compression Therapy [1:Debridement] [2:Compression Therapy 4 5] [3:Compression Therapy 6] Photos: [1:No Photos] [2:No Photos] [3:No Photos] Wound Location: [1:Right Lower Leg - Lateral, Right Lower Leg - Lateral Right Lower Leg - Distal Proximal] Wounding Event: [1:Blister] [2:Blister] [3:Blister] Primary Etiology: [1:Venous Leg Ulcer] [2:Venous Leg Ulcer] [3:Venous Leg Ulcer] Comorbid History: [1:Sleep Apnea, Congestive Sleep Apnea, Congestive Sleep Apnea, Congestive Heart Failure, Hypertension, Heart Failure, Hypertension, Heart Failure, Hypertension, Type II Diabetes, Neuropathy, Received Chemotherapy] [2:Type II Diabetes,  Neuropathy, Received Chemotherapy] [3:Type II Diabetes, Neuropathy, Received Chemotherapy] Date Acquired: [1:05/01/2019] [2:05/01/2019] [3:05/01/2019] Weeks of Treatment: [1:0] [2:0] [3:0] Wound Status: [1:Open] [2:Open] [3:Open] Clustered Wound: [1:Yes] [2:No] [3:No] Clustered Quantity: [1:2] [2:N/A] [3:N/A] Measurements L x W x D 4.6x3.2x0.1 [2:5x4.5x0.1] [3:3.5x4.5x0.1] (cm) Area (cm) : [1:11.561] [2:17.671]  [3:12.37] Volume (cm) : [1:1.156] [2:1.767] [3:1.237] Classification: [1:Full Thickness Without Exposed Support Structures Exposed Support Structures Exposed Support Structures] [2:Full Thickness Without] [3:Full Thickness Without] Exudate Amount: [1:Medium] [2:Medium] [3:Medium] Exudate Type: [1:Serosanguineous] [2:Serosanguineous] [3:Serosanguineous] Exudate Color: [1:red, brown] [2:red, brown] [3:red, brown] Wound Margin: [1:Flat and Intact] [2:Flat and Intact] [3:Flat and Intact] Granulation Amount: [1:Large (67-100%)] [2:Large (67-100%)] [3:Large (67-100%)] Granulation Quality: [1:Pink] [2:Pink] [3:Red] Necrotic Amount: [1:None Present (0%)] [2:None Present (0%)] [3:None Present (0%)] Necrotic Tissue: [1:N/A] [2:N/A] [3:N/A] Exposed Structures: [1:Fat Layer (Subcutaneous Fat Layer (Subcutaneous Fat Layer (Subcutaneous Tissue) Exposed: Yes Fascia: No Tendon: No Muscle: No Joint: No Bone: No] [2:Tissue) Exposed: Yes Fascia: No Tendon: No Muscle: No Joint: No Bone: No] [3:Tissue) Exposed: Yes  Fascia: No Tendon: No Muscle: No Joint: No Bone: No] Epithelialization: [1:None] [2:None] [3:None] Debridement: [1:N/A] [2:N/A] [3:N/A] Pain Control: [1:N/A] [2:N/A] [3:N/A] Tissue Debrided: [1:N/A] [2:N/A] [3:N/A] Level: [1:N/A] [2:N/A] [3:N/A] Debridement Area (sq cm):N/A [2:N/A] [3:N/A] Instrument: [1:N/A] [2:N/A] [3:N/A] Bleeding: [1:N/A] [2:N/A] [3:N/A] Hemostasis Achieved: [1:N/A] [2:N/A] [3:N/A] Procedural Pain: [1:N/A] [2:N/A] [3:N/A] Post Procedural Pain: [1:N/A] [2:N/A] [3:N/A] Debridement Treatment N/A [2:N/A] [3:N/A] Response: Post Debridement [1:N/A] [2:N/A] [3:N/A] Measurements L x  W x D (cm) Post Debridement [1:N/A] [2:N/A] [3:N/A] Volume: (cm) Procedures Performed: Compression Therapy [2:Compression Therapy 7 N/A] [3:Compression Therapy N/A] Photos: [1:No Photos] [2:N/A] [3:N/A] Wound Location: [1:Right Lower Leg - Posterior] [2:N/A] [3:N/A] Wounding Event: [1:Blister]  [2:N/A] [3:N/A] Primary Etiology: [1:Venous Leg Ulcer] [2:N/A] [3:N/A] Comorbid History: [1:Sleep Apnea, Congestive Heart Failure, Hypertension, Type II Diabetes, Neuropathy, Received Chemotherapy] [2:N/A] [3:N/A] Date Acquired: [1:05/01/2019] [2:N/A] [3:N/A] Weeks of Treatment: [1:0] [2:N/A] [3:N/A] Wound Status: [1:Open] [2:N/A] [3:N/A] Clustered Wound: [1:No] [2:N/A] [3:N/A] Clustered Quantity: [1:N/A] [2:N/A] [3:N/A] Measurements L x W x D 5x6.3x0.1 [2:N/A] [3:N/A] (cm) Area (cm) : [1:24.74] [2:N/A] [3:N/A] Volume (cm) : [1:2.474] [2:N/A] [3:N/A] Classification: [1:Full Thickness Without Exposed Support Structures] [2:N/A] [3:N/A] Exudate Amount: [1:Medium] [2:N/A] [3:N/A] Exudate Type: [1:Serosanguineous] [2:N/A] [3:N/A] Exudate Color: [1:red, brown] [2:N/A] [3:N/A] Wound Margin: [1:Flat and Intact] [2:N/A] [3:N/A] Granulation Amount: [1:Large (67-100%)] [2:N/A] [3:N/A] Granulation Quality: [1:Red] [2:N/A] [3:N/A] Necrotic Amount: [1:None Present (0%)] [2:N/A] [3:N/A] Necrotic Tissue: [1:N/A] [2:N/A] [3:N/A] Exposed Structures: [1:Fat Layer (Subcutaneous N/A Tissue) Exposed: Yes Fascia: No Tendon: No Muscle: No Joint: No Bone: No] [3:N/A] Epithelialization: [1:None] [2:N/A] [3:N/A] Debridement: [1:N/A] [2:N/A] [3:N/A] Pain Control: [1:N/A] [2:N/A] [3:N/A] Tissue Debrided: [1:N/A] [2:N/A] [3:N/A] Level: [1:N/A] [2:N/A] [3:N/A] Debridement Area (sq cm):N/A [2:N/A] [3:N/A] Instrument: [1:N/A] [2:N/A] [3:N/A] Bleeding: [1:N/A] [2:N/A] [3:N/A] Hemostasis Achieved: [1:N/A] [2:N/A] [3:N/A] Procedural Pain: [1:N/A] [2:N/A] [3:N/A] Post Procedural Pain: [1:N/A] [2:N/A] [3:N/A] Debridement Treatment N/A [2:N/A] [3:N/A] Response: Post Debridement [1:N/A] [2:N/A] [3:N/A] Measurements L x W x D (cm) Post Debridement [1:N/A] [2:N/A] [3:N/A] Volume: (cm) Procedures Performed: Compression Therapy [2:N/A] [3:N/A] Treatment Notes Electronic Signature(s) Signed: 07/18/2019 6:13:48 PM  By: Linton Ham MD Signed: 07/18/2019 6:31:45 PM By: Deon Pilling Entered By: Linton Ham on 07/18/2019 15:31:24 -------------------------------------------------------------------------------- Multi-Disciplinary Care Plan Details Patient Name: Date of Service: [name] 07/18/2019 1:15 PM Medical Record [id number] Patient Account Number: [id number] Date of Birth/Sex: Treating RN: [birth year] (73 y.o. [name], Tammi Klippel Primary Care Ahlivia Salahuddin: [name] Other Clinician: Referring Percell Lamboy: Treating Salena Ortlieb/Extender:Robson, [name], [name] in Treatment: 0 Active Inactive Nutrition Nursing Diagnoses: Potential for alteratiion in Nutrition/Potential for imbalanced nutrition Goals: Patient/caregiver agrees to and verbalizes understanding of need to obtain nutritional consultation Date Initiated: 07/18/2019 Target Resolution Date: 08/23/2019 Goal Status: Active Interventions: Assess patient nutrition upon admission and as needed per policy Provide education on nutrition Treatment Activities: Patient referred to Primary Care Physician for further nutritional evaluation : 07/18/2019 Notes: Orientation to the Wound Care Program Nursing Diagnoses: Knowledge deficit related to the wound healing center program Goals: Patient/caregiver will verbalize understanding of the [facility] Date Initiated: 07/18/2019 Target Resolution Date: 08/23/2019 Goal Status: Active Interventions: Provide education on orientation to the wound center Notes: Pain, Acute or Chronic Nursing Diagnoses: Pain, acute or chronic: actual or potential Potential alteration in comfort, pain Goals: Patient will verbalize adequate pain control and receive pain control interventions during procedures as needed Date Initiated: 07/18/2019 Target Resolution Date: 08/23/2019 Goal Status: Active Patient/caregiver will verbalize comfort level met Date Initiated:  07/18/2019 Target Resolution Date: 08/23/2019 Goal Status: Active Interventions: Encourage patient to take pain medications as prescribed Provide education on pain management Reposition patient for comfort Treatment Activities: Administer pain control measures as ordered : 07/18/2019 Notes: Electronic Signature(s) Signed: 07/18/2019 6:31:45 PM By: Deon Pilling Entered By: Deon Pilling on 07/18/2019 13:25:07 -------------------------------------------------------------------------------- Pain Assessment Details Patient Name: Date of Service: BRAXTEN, MEMMER 07/18/2019 1:15 PM Medical Record [id number] Patient Account Number: [id number] Date of Birth/Sex: Treating RN: [birth year] (  73 y.o. Janyth Contes Primary Care Maryem Shuffler: [name] Other Clinician: Referring Megon Kalina: Treating Yoan Sallade/Extender:Robson, [name], [name] in Treatment: 0 Active Problems Location of Pain Severity and Description of Pain Patient Has Paino No Site Locations Pain Management and Medication Current Pain Management: Electronic Signature(s) Signed: 07/22/2019 6:17:12 PM By: Levan Hurst RN, BSN Entered By: Levan Hurst on 07/18/2019 14:28:53 -------------------------------------------------------------------------------- Patient/Caregiver Education Details Patient Name: Date of Service: [name] 10/1/2020andnbsp1:15 PM Medical Record [id number] Patient Account Number: [id number] Date of Birth/Gender: Treating RN: 1945-10-20 (73 y.o. [name] Primary Care Physician: [name] Other Clinician: Referring Physician: Treating Physician/Extender:Robson, [name], [name] in Treatment: 0 Education Assessment Education Provided To: Patient Education Topics Provided Nutrition: Handouts: Nutrition Methods: Explain/Verbal, Printed Responses: Reinforcements needed Welcome To [facility]: Handouts: Welcome To [facility] Methods: Explain/Verbal, Printed Responses: Reinforcements needed Electronic Signature(s) Signed: 07/18/2019 6:31:45 PM By: Deon Pilling Entered By: Deon Pilling on 07/18/2019 13:25:27 -------------------------------------------------------------------------------- Wound Assessment Details Patient Name: Date of Service: EMMANUEL, GRUENHAGEN 07/18/2019 1:15 PM Medical Record [id number] Patient Account Number: [id number] Date of Birth/Sex: Treating RN: 24-Mar-1946 (73 y.o. Janyth Contes Primary Care Shantae Vantol: [name] Other Clinician: Referring Pualani Borah: Treating Latisa Belay/Extender:Robson, [name], [name] in Treatment: 0 Wound Status Wound Number: 1 Primary Trauma, Other Etiology: Etiology: Wound Location: Right Knee - Medial Wound Open Wounding Event: Trauma Status: Date Acquired: 05/01/2019 Comorbid Sleep Apnea, Congestive Heart Failure, Weeks Of Treatment: 0 History: Hypertension, Type II Diabetes, Neuropathy, Clustered Wound: No Received Chemotherapy Photos Wound Measurements Length: (cm) 5.6 Width: (cm) 6.8 Depth: (cm) 0.1 Area: (cm) 29.908 Volume: (cm) 2.991 Wound Description Classification: Unclassifiable Wound Margin: Flat and Intact Exudate Amount: Medium Wound Bed Granulation Amount: None Present (0%) Necrotic Amount: Large (67-100%) Necrotic Quality: Eschar After Cleansing: No rino Yes Exposed Structure sed: No Subcutaneous Tissue) Exposed: No sed: No sed: No ed: No d: No % Reduction in Area: 0% % Reduction in Volume: 0% Epithelialization: None Tunneling: No Undermining: No Foul Odor Slough/Fib Fascia Expo Fat Layer ( Tendon Expo Muscle Expo Joint Expos Bone Expose Treatment Notes Wound #1 (Right, Medial Knee) 2. Periwound Care Skin Prep 3. Primary Dressing Applied Santyl 4. Secondary Dressing Foam Border Dressing Electronic Signature(s) Signed: 07/19/2019 4:13:18 PM By: Mikeal Hawthorne  EMT/HBOT Signed: 07/22/2019 6:17:12 PM By: Levan Hurst RN, BSN Entered By: Mikeal Hawthorne on 07/19/2019 10:39:26 -------------------------------------------------------------------------------- Wound Assessment Details Patient Name: Date of Service: BREK, REECE 07/18/2019 1:15 PM Medical Record [id number] Patient Account Number: [id number] Date of Birth/Sex: Treating RN: 28-Apr-1946 (73 y.o. Janyth Contes Primary Care Calvert Charland: [name] Other Clinician: Referring Teng Decou: Treating Ibrahima Holberg/Extender:Robson, [name], [name] in Treatment: 0 Wound Status Wound Number: 2 Primary Venous Leg Ulcer Etiology: Wound Location: Right Lower Leg - Anterior, Proximal Wound Open Status: Wounding Event: Blister Comorbid Sleep Apnea, Congestive Heart Failure, Date Acquired: 05/01/2019 History: Hypertension, Type II Diabetes, Neuropathy, Weeks Of Treatment: 0 Received Chemotherapy Clustered Wound: Yes Photos Wound Measurements Length: (cm) 1.2 % Reduct Width: (cm) 3.6 % Reduct Depth: (cm) 0.1 Epitheli Clustered Quantity: 2 Tunnelin Area: (cm) 3.393 Undermi Volume: (cm) 0.339 Wound Description Classification: Full Thickness Without Exposed Support Structures Wound Flat and Intact Margin: Exudate Small Amount: Exudate Serous Type: Exudate amber Color: Wound Bed Granulation Amount: Large (67-100%) Granulation Quality: Red Necrotic Amount: None Present (0%) Exposed Structure Fascia Exposed: No Fat Layer (Subcutaneous Tissue) Exposed: Yes Tendon Exposed: No Muscle Exposed:  No Joint Exposed: No Bone Exposed: No ion in Area: 0% ion in Volume: 0% alization: None g: No ning: No Treatment Notes Wound #2 (Right, Proximal, Anterior Lower Leg) 2. Periwound Care Moisturizing lotion 3. Primary Dressing Applied Calcium Alginate Ag 4. Secondary Dressing ABD Pad Kerramax/Xtrasorb 6. Support Layer Applied 4 layer compression  wrap Notes zetuvit Electronic Signature(s) Signed: 07/19/2019 4:13:18 PM By: Mikeal Hawthorne EMT/HBOT Signed: 07/22/2019 6:17:12 PM By: Levan Hurst RN, BSN Entered By: Mikeal Hawthorne on 07/19/2019 10:39:51 -------------------------------------------------------------------------------- Wound Assessment Details Patient Name: Date of Service: KADDEN, OSTERHOUT 07/18/2019 1:15 PM Medical Record [id number] Patient Account Number: [id number] Date of Birth/Sex: Treating RN: 02-Oct-1946 (73 y.o. Janyth Contes Primary Care Aurther Harlin: [name] Other Clinician: Referring Gloria Ricardo: Treating Blas Riches/Extender:Robson, [name], [name] in Treatment: 0 Wound Status Wound Number: 3 Primary Venous Leg Ulcer Etiology: Wound Location: Right Lower Leg - Anterior, Distal Wound Open Wounding Event: Blister Status: Date Acquired: 05/01/2019 Comorbid Sleep Apnea, Congestive Heart Failure, Weeks Of Treatment: 0 History: Hypertension, Type II Diabetes, Neuropathy, Clustered Wound: No Received Chemotherapy Photos Wound Measurements Length: (cm) 2 % Reduct Width: (cm) 1.3 % Reduct Depth: (cm) 0.1 Epitheli Area: (cm) 2.042 Tunneli Volume: (cm) 0.204 Undermi Wound Description Full Thickness Without Exposed Support Foul Odo Classification: Structures Slough/F Wound Flat and Intact Margin: Exudate Medium Amount: Exudate Serous Type: Exudate amber Color: Wound Bed Granulation Amount: Large (67-100%) Granulation Quality: Pink Fascia [name] Necrotic Amount: None Present (0%) Fat Laye Tendon [name] Muscle [name] Joint Ex Bone Exp r After Cleansing: No ibrino No Exposed Structure xposed: No r (Subcutaneous Tissue) Exposed: Yes xposed: No xposed: No posed: No osed: No ion in Area: 0% ion in Volume: 0% alization: None ng: No ning: No Treatment Notes Wound #3 (Right, Distal, Anterior Lower Leg) 2. Periwound Care Moisturizing lotion 3. Primary Dressing  Applied Calcium Alginate Ag 4. Secondary Dressing ABD Pad Kerramax/Xtrasorb 6. Support Layer Applied 4 layer compression wrap Notes zetuvit Electronic Signature(s) Signed: 07/19/2019 4:13:18 PM By: Mikeal Hawthorne EMT/HBOT Signed: 07/22/2019 6:17:12 PM By: Levan Hurst RN, BSN Entered By: Mikeal Hawthorne on 07/19/2019 10:40:48 -------------------------------------------------------------------------------- Wound Assessment Details Patient Name: Date of Service: COMER, DEVINS 07/18/2019 1:15 PM Medical Record [id number] Patient Account Number: [id number] Date of Birth/Sex: Treating RN: 1946-07-31 (73 y.o. Janyth Contes Primary Care Taliana Mersereau: [name] Other Clinician: Referring Zayda Angell: Treating Bazil Dhanani/Extender:Robson, [name], [name] in Treatment: 0 Wound Status Wound Number: 4 Primary Venous Leg Ulcer Etiology: Wound Location: Right Lower Leg - Lateral, Proximal Wound Open Wounding Event: Blister Status: Date Acquired: 05/01/2019 Comorbid Sleep Apnea, Congestive Heart Failure, Weeks Of Treatment: 0 History: Hypertension, Type II Diabetes, Neuropathy, Clustered Wound: Yes Received Chemotherapy Photos Wound Measurements Length: (cm) 4.6 % Reduc Width: (cm) 3.2 % Reduc Depth: (cm) 0.1 Epithel Clustered Quantity: 2 Tunneli Area: (cm) 11.561 Underm Volume: (cm) 1.156 Wound Description Full Thickness Without Exposed Support Classification: Structures Wound Flat and Intact Margin: Exudate Medium Amount: Exudate Serosanguineous Type: Exudate red, brown Color: Wound Bed Granulation Amount: Large (67-100%) Granulation Quality: Pink Necrotic Amount: None Present (0%) Foul Odor After Cleansing: No Slough/Fibrino No Exposed Structure Fascia Exposed: No Fat Layer (Subcutaneous Tissue) Exposed: Yes Tendon Exposed: No Muscle Exposed: No Joint Exposed: No Bone Exposed: No tion in Area: 0% tion in Volume: 0% ialization:  None ng: No ining: No Treatment Notes Wound #4 (Right, Proximal, Lateral Lower Leg) 2. Periwound Care Moisturizing lotion 3. Primary Dressing Applied Calcium Alginate Ag 4. Secondary Dressing  ABD Pad Kerramax/Xtrasorb 6. Support Layer Applied 4 layer compression wrap Notes zetuvit Electronic Signature(s) Signed: 07/19/2019 4:13:18 PM By: Mikeal Hawthorne EMT/HBOT Signed: 07/22/2019 6:17:12 PM By: Levan Hurst RN, BSN Entered By: Mikeal Hawthorne on 07/19/2019 10:41:08 -------------------------------------------------------------------------------- Wound Assessment Details Patient Name: Date of Service: RAYNE, LOISEAU 07/18/2019 1:15 PM Medical Record [id number] Patient Account Number: [id number] Date of Birth/Sex: Treating RN: 11/15/1945 (73 y.o. Janyth Contes Primary Care Rosalea Withrow: [name] Other Clinician: Referring Saliyah Gillin: Treating Mak Bonny/Extender:Robson, [name], [name] in Treatment: 0 Wound Status Wound Number: 5 Primary Venous Leg Ulcer Etiology: Wound Location: Right Lower Leg - Lateral Wound Open Wounding Event: Blister Status: Date Acquired: 05/01/2019 Comorbid Sleep Apnea, Congestive Heart Failure, Weeks Of Treatment: 0 History: Hypertension, Type II Diabetes, Neuropathy, Clustered Wound: No Received Chemotherapy Photos Wound Measurements Length: (cm) 5 % Reduct Width: (cm) 4.5 % Reduct Depth: (cm) 0.1 Epitheli Area: (cm) 17.671 Tunneli Volume: (cm) 1.767 Undermi Wound Description Classification: Full Thickness Without Exposed Support Foul Odo Structures Slough/F Wound Flat and Intact Margin: Exudate Medium Amount: Exudate Serosanguineous Type: Exudate red, brown Color: Wound Bed Granulation Amount: Large (67-100%) Granulation Quality: Pink Fascia Necrotic Amount: None Present (0%) Fat Lay Tendon Muscle Joint [name] Bone Ex r After Cleansing: No ibrino No Exposed Structure Exposed: No er  (Subcutaneous Tissue) Exposed: Yes Exposed: No Exposed: No xposed: No posed: No ion in Area: 0% ion in Volume: 0% alization: None ng: No ning: No Treatment Notes Wound #5 (Right, Lateral Lower Leg) 2. Periwound Care Moisturizing lotion 3. Primary Dressing Applied Calcium Alginate Ag 4. Secondary Dressing ABD Pad Kerramax/Xtrasorb 6. Support Layer Applied 4 layer compression wrap Notes zetuvit Electronic Signature(s) Signed: 07/19/2019 4:13:18 PM By: Mikeal Hawthorne EMT/HBOT Signed: 07/22/2019 6:17:12 PM By: Levan Hurst RN, BSN Entered By: Mikeal Hawthorne on 07/19/2019 10:41:25 -------------------------------------------------------------------------------- Wound Assessment Details Patient Name: Date of Service: JENTRY, WARNELL 07/18/2019 1:15 PM Medical Record [id number] Patient Account Number: [id number] Date of Birth/Sex: Treating RN: 1946/04/04 (73 y.o. Janyth Contes Primary Care Acie Custis: [name] Other Clinician: Referring Yatziri Wainwright: Treating Kaidon Kinker/Extender:Robson, [name], [name] in Treatment: 0 Wound Status Wound Number: 6 Primary Venous Leg Ulcer Etiology: Wound Location: Right Lower Leg - Distal Wound Open Wounding Event: Blister Status: Date Acquired: 05/01/2019 Date Acquired: 05/01/2019 Comorbid Sleep Apnea, Congestive Heart Failure, Weeks Of Treatment: 0 History: Hypertension, Type II Diabetes, Neuropathy, Clustered Wound: No Received Chemotherapy Photos Wound Measurements Length: (cm) 3.5 % Reduct Width: (cm) 4.5 % Reduct Depth: (cm) 0.1 Epitheli Area: (cm) 12.37 Tunneli Volume: (cm) 1.237 Undermi Wound Description Full Thickness Without Exposed Support Foul Odo Classification: Structures Slough/F Wound Flat and Intact Margin: Exudate Medium Amount: Exudate Serosanguineous Type: Exudate red, brown Color: Wound Bed Granulation Amount: Large (67-100%) Granulation Quality: Red Fascia [name] Necrotic  Amount: None Present (0%) Fat Laye Tendon [name] Muscle [name] Joint Ex Bone Exp r After Cleansing: No ibrino No Exposed Structure xposed: No r (Subcutaneous Tissue) Exposed: Yes xposed: No xposed: No posed: No osed: No ion in Area: 0% ion in Volume: 0% alization: None ng: No ning: No Treatment Notes Wound #6 (Right, Distal Lower Leg) 2. Periwound Care Moisturizing lotion 3. Primary Dressing Applied Calcium Alginate Ag 4. Secondary Dressing ABD Pad Kerramax/Xtrasorb 6. Support Layer Applied 4 layer compression wrap Notes zetuvit Electronic Signature(s) Signed: 07/19/2019 4:13:18 PM By: Mikeal Hawthorne EMT/HBOT Signed: 07/22/2019 6:17:12 PM By: Levan Hurst RN, BSN Entered By: Mikeal Hawthorne on 07/19/2019 10:41:44 -------------------------------------------------------------------------------- Wound Assessment Details Patient  Name: Date of Service: REEVE, TURNLEY 07/18/2019 1:15 PM Medical Record [id number] Patient Account Number: [id number] Date of Birth/Sex: Treating RN: 02-27-1946 (73 y.o. Janyth Contes Primary Care Elvera Almario: [name] Other Clinician: Referring Paidyn Mcferran: Treating Tammie Ellsworth/Extender:Robson, [name], [name] in Treatment: 0 Wound Status Wound Number: 7 Primary Venous Leg Ulcer Etiology: Wound Location: Right Lower Leg - Posterior Wound Open Wounding Event: Blister Status: Date Acquired: 05/01/2019 Comorbid Sleep Apnea, Congestive Heart Failure, Weeks Of Treatment: 0 History: Hypertension, Type II Diabetes, Neuropathy, Clustered Wound: No Received Chemotherapy Photos Wound Measurements Length: (cm) 5 % Reduct Width: (cm) 6.3 % Reduct Depth: (cm) 0.1 Epitheli Area: (cm) 24.74 Tunneli Volume: (cm) 2.474 Undermi Wound Description Full Thickness Without Exposed Support Foul Odo Classification: Structures Slough/F Wound Flat and Intact Margin: Exudate Medium Amount: Exudate Serosanguineous Type: Exudate  red, brown Color: Wound Bed Granulation Amount: Large (67-100%) Granulation Quality: Red Fascia Exp Necrotic Amount: None Present (0%) Fat Layer Tendon Exp Muscle Exp Joint Expo Bone Expos r After Cleansing: No ibrino No Exposed Structure osed: No (Subcutaneous Tissue) Exposed: Yes osed: No osed: No sed: No ed: No ion in Area: 0% ion in Volume: 0% alization: None ng: No ning: No Treatment Notes Wound #7 (Right, Posterior Lower Leg) 2. Periwound Care Moisturizing lotion 3. Primary Dressing Applied Calcium Alginate Ag 4. Secondary Dressing ABD Pad Kerramax/Xtrasorb 6. Support Layer Applied 4 layer compression wrap Notes zetuvit Electronic Signature(s) Signed: 07/19/2019 4:13:18 PM By: Mikeal Hawthorne EMT/HBOT Signed: 07/22/2019 6:17:12 PM By: Levan Hurst RN, BSN Entered By: Mikeal Hawthorne on 07/19/2019 10:43:18 -------------------------------------------------------------------------------- Vitals Details Patient Name: Date of Service: [name] 07/18/2019 1:15 PM Medical Record [id number] Patient Account Number: [id number] Date of Birth/Sex: Treating RN: 12/28/1945 (73 y.o. Janyth Contes Primary Care Sinda Leedom: [name] Other Clinician: Referring Seibert Keeter: Treating Ann Bohne/Extender:Robson, [name], [name] in Treatment: 0 Vital Signs Time Taken: 13:28 Temperature (F): 98.3 Height (in): 77 Pulse (bpm): 56 Source: Stated Respiratory Rate (breaths/min): 18 Weight (lbs): 350 Blood Pressure (mmHg): 132/52 Source: Stated Reference Range: 80 - 120 mg / dl Body Mass Index (BMI): 41.5 Electronic Signature(s) Signed: 07/19/2019 7:05:19 PM By: Baruch Gouty RN, BSN Entered By: Baruch Gouty on 07/18/2019 15:43:09

## 2019-07-22 NOTE — Progress Notes (Signed)
ROSTON, GRUNEWALD ([id number]) Visit Report for 07/18/2019 Abuse/Suicide Risk Screen Details Patient Name: Date of Service: RONON, FERGER 07/18/2019 1:15 PM Medical Record [id number] Patient Account Number: [id number] Date of Birth/Sex: Treating RN: 11/14/1945 (73 y.o. Janyth Contes Primary Care Nalani Andreen: [name] Other Clinician: Referring Mykal Batiz: Treating Jariah Jarmon/Extender:Robson, [name], [name] in Treatment: 0 Abuse/Suicide Risk Screen Items Answer ABUSE RISK SCREEN: Has anyone close to you tried to hurt or harm you recentlyo No Do you feel uncomfortable with anyone in your familyo No Has anyone forced you do things that you didnt want to doo No Electronic Signature(s) Signed: 07/22/2019 6:17:12 PM By: Levan Hurst RN, BSN Entered By: Levan Hurst on 07/18/2019 14:01:05 -------------------------------------------------------------------------------- Activities of Daily Living Details Patient Name: Date of Service: JASHAWN, FLOYD 07/18/2019 1:15 PM Medical Record [id number] Patient Account Number: [id number] Date of Birth/Sex: Treating RN: [birth year] (73 y.o. Janyth Contes Primary Care Ilyanna Baillargeon: [name] Other Clinician: Referring Zev Blue: Treating Romanita Fager/Extender:Robson, [name], [name] in Treatment: 0 Activities of Daily Living Items Answer Activities of Daily Living (Please select one for each item) Drive Automobile Need Assistance Take Medications Completely Able Use Telephone Completely Able Care for Appearance Completely Able Use Toilet Completely Able Bath / Shower Completely Able Dress Self Completely Able Feed Self Completely Able Walk Completely Able Get In / Out Bed Completely Able Housework Completely Able Prepare Meals Completely [place] Completely Able Shop for Self Completely Able Electronic Signature(s) Signed: 07/22/2019 6:17:12 PM By: Levan Hurst RN,  BSN Entered By: Levan Hurst on 07/18/2019 14:01:59 -------------------------------------------------------------------------------- Education Screening Details Patient Name: Date of Service: [name] 07/18/2019 1:15 PM Medical Record [id number] Patient Account Number: [id number] Date of Birth/Sex: Treating RN: [birth year] (73 y.o. Janyth Contes Primary Care Linet Brash: [name] Other Clinician: Referring Damika Harmon: Treating Jossilyn Benda/Extender:Robson, [name], [name] in Treatment: 0 Primary Learner Assessed: Patient Learning Preferences/Education Level/Primary Language Learning Preference: Explanation, Demonstration, Printed Material Highest Education Level: College or Above Preferred Language: English Cognitive Barrier Language Barrier: No Translator Needed: No Memory Deficit: No Emotional Barrier: No Cultural/Religious Beliefs Affecting Medical Care: No Physical Barrier Impaired Vision: No Impaired Hearing: No Decreased Hand dexterity: No Knowledge/Comprehension Knowledge Level: High Comprehension Level: High Ability to understand written High instructions: Ability to understand verbal High instructions: Motivation Anxiety Level: Calm Cooperation: Cooperative Education Importance: Acknowledges Need Interest in Health Problems: Asks Questions Perception: Coherent Willingness to Engage in Self- High Management Activities: Readiness to Engage in Self- High Management Activities: Electronic Signature(s) Signed: 07/22/2019 6:17:12 PM By: Levan Hurst RN, BSN Entered By: Levan Hurst on 07/18/2019 14:03:02 -------------------------------------------------------------------------------- Fall Risk Assessment Details Patient Name: Date of Service: [name] 07/18/2019 1:15 PM Medical Record [id number] Patient Account Number: [id number] Date of Birth/Sex: Treating RN: [birth year] (73 y.o. Janyth Contes Primary Care Jaydalynn Olivero: [name] Other Clinician: Referring Vara Mairena: Treating Yandiel Bergum/Extender:Robson, [name], [name] in Treatment: 0 Fall Risk Assessment Items Have you had 2 or more falls in the last 12 monthso 0 Yes Have you had any fall that resulted in injury in the last 12 monthso 0 Yes FALLS RISK SCREEN History of falling - immediate or within 3 months 25 Yes Secondary diagnosis (Do you have 2 or more medical diagnoseso) 15 Yes Ambulatory aid None/bed rest/wheelchair/nurse 0 No Crutches/cane/walker 15 Yes Furniture 0 No Intravenous therapy Access/Saline/Heparin Lock 0 No Weak (short steps with or without shuffle, stooped but able to lift head 0 No  while walking, may seek support from furniture) Impaired (short steps with shuffle, may have difficulty arising from chair, 0 No head down, impaired balance) Mental Status Oriented to own ability 0 Yes Overestimates or forgets limitations 0 No Risk Level: High Risk Score: 55 Electronic Signature(s) Signed: 07/22/2019 6:17:12 PM By: Levan Hurst RN, BSN Entered By: Levan Hurst on 07/18/2019 14:03:21 -------------------------------------------------------------------------------- Foot Assessment Details Patient Name: Date of Service: [name] 07/18/2019 1:15 PM Medical Record [id number] Patient Account Number: [id number] Date of Birth/Sex: Treating RN: [birth year] (73 y.o. Janyth Contes Primary Care Frederika Hukill: [name] Other Clinician: Referring Jemarion Roycroft: Treating Frederik Standley/Extender:Robson, [name], [name] in Treatment: 0 Foot Assessment Items Site Locations + = Sensation present, - = Sensation absent, C = Callus, U = Ulcer R = Redness, W = Warmth, M = Maceration, PU = Pre-ulcerative lesion F = Fissure, S = Swelling, D = Dryness Assessment Right: Left: Other Deformity: No No Prior Foot Ulcer: No No Prior Amputation: No No Charcot Joint: No  No Ambulatory Status: Ambulatory With Help Assistance Device: Walker Gait: Steady Electronic Signature(s) Signed: 07/22/2019 6:17:12 PM By: Levan Hurst RN, BSN Entered By: Levan Hurst on 07/18/2019 14:07:12 -------------------------------------------------------------------------------- Nutrition Risk Screening Details Patient Name: Date of Service: [name] 07/18/2019 1:15 PM Medical Record [id number] Patient Account Number: [id number] Date of Birth/Sex: Treating RN: 08-Jan-1946 (73 y.o. Janyth Contes Primary Care Michaela Shankel: [name] Other Clinician: Referring Virgilia Quigg: Treating Adler Chartrand/Extender:Robson, [name], [name] in Treatment: 0 Height (in): 77 Weight (lbs): 350 Body Mass Index (BMI): 41.5 Nutrition Risk Screening Items Score Screening NUTRITION RISK SCREEN: I have an illness or condition that made me change the kind and/or 2 Yes amount of food I eat I eat fewer than two meals per day 0 No I eat few fruits and vegetables, or milk products 0 No I have three or more drinks of beer, liquor or wine almost every day 0 No I have tooth or mouth problems that make it hard for me to eat 0 No I don't always have enough money to buy the food I need 0 No I eat alone most of the time 0 No I take three or more different prescribed or over-the-counter drugs a day 1 Yes 0 No Without wanting to, I have lost or gained 10 pounds in the last six months I am not always physically able to shop, cook and/or feed myself 0 No Nutrition Protocols Good Risk Protocol Provide education on Moderate Risk Protocol 0 nutrition High Risk Proctocol Risk Level: Moderate Risk Score: 3 Electronic Signature(s) Signed: 07/22/2019 6:17:12 PM By: Levan Hurst RN, BSN Entered By: Levan Hurst on 07/18/2019 14:03:45

## 2019-07-22 NOTE — Progress Notes (Signed)
MAYFIELD, SCHOENE ([id number]) Visit Report for 07/18/2019 Chief Complaint Document Details Patient Name: Date of Service: IZEA, LIVOLSI 07/18/2019 1:15 PM Medical Record [id number] Patient Account Number: [id number] Date of Birth/Sex: Treating RN: Mar 21, 1946 (73 y.o. [name] Primary Care Provider: [name] Other Clinician: Referring Provider: Treating Provider/Extender:Robson, [name], [name] in Treatment: 0 Information Obtained from: Patient Chief Complaint Wound exam; patient is here for review of wound on his right medial knee which with result of a fall. He also has 6 wounds on his right lower leg which are secondary to lymphedema Electronic Signature(s) Signed: 07/18/2019 6:13:48 PM By: Linton Ham MD Entered By: Linton Ham on 07/18/2019 15:32:55 -------------------------------------------------------------------------------- Debridement Details Patient Name: Date of Service: [name] 07/18/2019 1:15 PM Medical Record [id number] Patient Account Number: [id number] Date of Birth/Sex: Treating RN: 02-Jan-1946 (73 y.o. [name], Meta.Reding Primary Care Provider: [name] Other Clinician: Referring Provider: Treating Provider/Extender:Robson, [name], [name] in Treatment: 0 Debridement Performed for Wound #1 Right,Medial Knee Assessment: Performed By: Physician Ricard Dillon., MD Debridement Type: Debridement Level of Consciousness (Pre- Awake and Alert procedure): Pre-procedure Verification/Time Out Taken: Yes - 14:40 Start Time: 14:41 Pain Control: Lidocaine 4% Topical Solution Total Area Debrided (L x W): 5.6 (cm) x 6.8 (cm) = 38.08 (cm) Tissue and other material Viable, Non-Viable, Eschar, Slough, Subcutaneous, Fibrin/Exudate, Slough debrided: Level: Skin/Subcutaneous Tissue Debridement Description: Excisional Instrument: Curette Bleeding: None Hemostasis Achieved: Pressure End  Time: 14:44 Procedural Pain: 0 Post Procedural Pain: 0 Response to Treatment: Procedure was tolerated well Level of Consciousness Awake and Alert (Post-procedure): Post Debridement Measurements of Total Wound Length: (cm) 5.6 Width: (cm) 6.8 Depth: (cm) 0.1 Volume: (cm) 2.991 Character of Wound/Ulcer Post Improved Debridement: Post Procedure Diagnosis Same as Pre-procedure Electronic Signature(s) Signed: 07/18/2019 6:13:48 PM By: Linton Ham MD Signed: 07/18/2019 6:31:45 PM By: Deon Pilling Entered By: Linton Ham on 07/18/2019 15:46:27 -------------------------------------------------------------------------------- HPI Details Patient Name: Date of Service: [name] 07/18/2019 1:15 PM Medical Record [id number] Patient Account Number: [id number] Date of Birth/Sex: Treating RN: 1946/10/10 (73 y.o. [name] Primary Care Provider: [name] Other Clinician: Referring Provider: Treating Provider/Extender:Robson, [name], [name] in Treatment: 0 History of Present Illness HPI Description: ADMISSION 07/18/2019 Patient is a 73 year old man with type 2 diabetes and also chronic edema. He had a fall in July. Fairly substantial wound on the right medial knee and across the top of the patella. This is actually made some progress since then. He was seen in the ED on 8/18 with cellulitis. He was treated with doxycycline. This got extended I think for as long as 30 days. On 9/15 he was seen by his primary felt to have cellulitis with open wounds. He currently has 7 wounds the remnant of the initial contusion injury on the right medial knee which actually I think is actually quite a bit smaller than it was looking at a picture on his wife's cell phone. He also has 6 is wounds on the right lower extremity which includes 2 on the right anterior 3 on the right lateral and one posteriorly. He has compression stockings but has not been wearing  them. He says these latter 6 wounds started as blisters that open. He has been using Neosporin. Past medical history; type 2 diabetes apparently well controlled, chronic atrial fibrillation, edema, systolic and diastolic congestive heart failure, cardiomyopathy, sleep apnea history of non-Hodgkin's lymphoma in his chest but he was treated with  chemotherapy and did not use or receive radiation ABI in our clinic was 1.14 on the right Electronic Signature(s) Signed: 07/18/2019 6:13:48 PM By: Linton Ham MD Entered By: Linton Ham on 07/18/2019 15:36:32 -------------------------------------------------------------------------------- Physical Exam Details Patient Name: Date of Service: [name] 07/18/2019 1:15 PM Medical Record [id number] Patient Account Number: [id number] Date of Birth/Sex: Treating RN: July 01, 1946 (73 y.o. [name] Primary Care Provider: [name] Other Clinician: Referring Provider: Treating Provider/Extender:Robson, [name], [name] in Treatment: 0 Eyes Conjunctivae clear. No discharge.no icterus. Respiratory work of breathing is normal. Bilateral breath sounds are clear and equal in all lobes with no wheezes, rales or rhonchi.. Cardiovascular Heart rhythm and rate regular, without murmur or gallop. JVP not elevated. Pulses palpable at the dorsalis pedis and posterior tibia.. Edema present in both extremities. This is nonpitting. Extensive skin changes on the right leg suggestive of chronic lymphedema with elephantiasis stage IV. Similar but lesser degrees of change on the left leg. Right leg is a lot more swollen than left. Gastrointestinal (GI) Abdomen is soft and non-distended without masses or tenderness.. Integumentary (Hair, Skin) Changes of chronic venous insufficiency in the right greater than left leg. Psychiatric appears at normal baseline. Notes Wound exam; the area on the right medial knee was covered by  thick adherent black eschar. Using pickups and a #10 scalpel this was removed then using a #5 curette the surface was debrided of very adherent necrotic debris. I was able to get down to mostly epithelium although this is probably still 40% slough covered Multiple wounds on the right lower leg which are superficial remanence of blisters with uncontrolled edema. No debridement was required in any of these sites Electronic Signature(s) Signed: 07/22/2019 6:11:18 PM By: Linton Ham MD Signed: 07/22/2019 6:17:12 PM By: Levan Hurst RN, BSN Previous Signature: 07/18/2019 6:13:48 PM Version By: Linton Ham MD Entered By: Levan Hurst on 07/22/2019 07:44:28 -------------------------------------------------------------------------------- Physician Orders Details Patient Name: Date of Service: AVARI, GELLES 07/18/2019 1:15 PM Medical Record [id number] Patient Account Number: [id number] Date of Birth/Sex: Treating RN: 1946-07-14 (73 y.o. [name] Primary Care Provider: [name] Other Clinician: Referring Provider: Treating Provider/Extender:Robson, [name], [name] in Treatment: 0 Verbal / Phone Orders: No Diagnosis Coding Follow-up Appointments Return Appointment in 1 week. Dressing Change Frequency Wound #1 Right,Medial Knee Change dressing every day. - home health to change once a week. wound center to change on Thursdays. Patient to change all other days. Wound #2 Right,Proximal,Anterior Lower Leg Other: - home health to change once a week. wound center to change on Thursdays. Wound #3 Right,Distal,Anterior Lower Leg Other: - home health to change once a week. wound center to change on Thursdays. Wound #4 Right,Proximal,Lateral Lower Leg Other: - home health to change once a week. wound center to change on Thursdays. Wound #5 Right,Lateral Lower Leg Other: - home health to change once a week. wound center to change on Thursdays. Wound #6  Right,Distal Lower Leg Other: - home health to change once a week. wound center to change on Thursdays. Wound #7 Right,Posterior Lower Leg Other: - home health to change once a week. wound center to change on Thursdays. Skin Barriers/Peri-Wound Care Moisturizing lotion - lotion to both legs. Wound Cleansing Wound #1 Right,Medial Knee Other: - may wash with soap and water. Wound #2 Right,Proximal,Anterior Lower Leg May shower with protection. - home health may wash legs with soap and water when dressing changes. Wound #3 Right,Distal,Anterior Lower Leg May  shower with protection. - home health may wash legs with soap and water when dressing changes. Wound #4 Right,Proximal,Lateral Lower Leg May shower with protection. - home health may wash legs with soap and water when dressing changes. Wound #5 Right,Lateral Lower Leg May shower with protection. - home health may wash legs with soap and water when dressing changes. Wound #6 Right,Distal Lower Leg May shower with protection. - home health may wash legs with soap and water when dressing changes. Wound #7 Right,Posterior Lower Leg May shower with protection. - home health may wash legs with soap and water when dressing changes. Primary Wound Dressing Wound #1 Right,Medial Knee Santyl Ointment Wound #2 Right,Proximal,Anterior Lower Leg Calcium Alginate with Silver Wound #3 Right,Distal,Anterior Lower Leg Calcium Alginate with Silver Wound #4 Right,Proximal,Lateral Lower Leg Calcium Alginate with Silver Wound #5 Right,Lateral Lower Leg Calcium Alginate with Silver Wound #6 Right,Distal Lower Leg Calcium Alginate with Silver Wound #7 Right,Posterior Lower Leg Calcium Alginate with Silver Secondary Dressing Wound #1 Right,Medial Knee Kerlix/Rolled Gauze - netting to secure dressing. ABD pad Wound #2 Right,Proximal,Anterior Lower Leg ABD pad Kerramax - or super absorptive pad. Wound #3 Right,Distal,Anterior Lower Leg ABD  pad Kerramax - or super absorptive pad. Wound #4 Right,Proximal,Lateral Lower Leg ABD pad Kerramax - or super absorptive pad. Wound #5 Right,Lateral Lower Leg ABD pad Kerramax - or super absorptive pad. Wound #6 Right,Distal Lower Leg ABD pad Kerramax - or super absorptive pad. Wound #7 Right,Posterior Lower Leg ABD pad Kerramax - or super absorptive pad. Edema Control 4 layer compression - Bilateral Avoid standing for long periods of time Elevate legs to the level of the heart or above for 30 minutes daily and/or when sitting, a frequency of: - throughout the day. Exercise regularly [facility] [employer] for [place] - home health to change once a week. wound center to change on Thursdays. Patient Medications Allergies: No Known Drug Allergies Notifications Medication Indication Start End lidocaine DOSE topical 4 % gel - gel topical applied prior to debridement by MD. Annitta Needs 07/18/2019 DOSE topical 250 unit/gram ointment - ointment topical to wound right knee change daily Electronic Signature(s) Signed: 07/18/2019 3:41:54 PM By: Linton Ham MD Entered By: Linton Ham on 07/18/2019 15:41:52 -------------------------------------------------------------------------------- Prescription 07/18/2019 Patient Name: Ma Rings C. Provider: Linton Ham MD Date of Birth: 14-Mar-1946 NPI#: [id number] Sex: [name] DEA#: [id number] Phone #: [phone] License #: [id number] Patient Address: [address] [place], [place] [address] [place], [place] [address] [phone] Allergies No Known Drug Allergies Medication Medication: Route: Strength: Form: lidocaine 4 % topical gel topical 4% gel Class: TOPICAL LOCAL ANESTHETICS Dose: Frequency / Time: Indication: gel topical applied prior to debridement by MD. Number of Refills: Number of Units: 0 Generic  Substitution: Start Date: End Date: One Time Use: Substitution Permitted No Note to Pharmacy: Signature(s): Date(s): Electronic Signature(s) Signed: 07/18/2019 6:13:48 PM By: Linton Ham MD Entered By: Linton Ham on 07/18/2019 15:41:55 --------------------------------------------------------------------------------  Problem List Details Patient Name: Date of Service: [name] 07/18/2019 1:15 PM Medical Record [id number] Patient Account Number: [id number] Date of Birth/Sex: Treating RN: [birth year] (73 y.o. 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 07/18/2019 No Yes leg limited to breakdown of skin I87.323 Chronic venous hypertension (idiopathic) with 07/18/2019 No Yes inflammation of bilateral lower extremity E11.622 Type 2 diabetes mellitus with other skin ulcer 07/18/2019 No Yes Inactive Problems Resolved Problems Electronic Signature(s) Signed: 07/18/2019 6:13:48 PM By: Linton Ham MD Entered By: Linton Ham on 07/18/2019 15:05:30 -------------------------------------------------------------------------------- Progress Note Details Patient Name: Date of Service: [name] 07/18/2019 1:15 PM Medical Record [id number] Patient Account Number: [id number] Date of Birth/Sex: Treating RN: 1945-12-25 (73 y.o. [name] Primary Care Provider: [name] Other Clinician: Referring  Provider: Treating Provider/Extender:Robson, [name], [name] in Treatment: 0 Subjective Chief Complaint Information obtained from Patient Wound exam; patient is here for review of wound on his right medial knee which with result of a fall. He also has 6 wounds on his right lower leg which are secondary to lymphedema History of Present Illness (HPI) ADMISSION 07/18/2019 Patient is a 73 year old man with type 2 diabetes and also chronic edema. He had a fall in July. Fairly substantial wound on the right medial knee and across the top of the patella. This is actually made some progress since then. He was seen in the ED on 8/18 with cellulitis. He was treated with doxycycline. This got extended I think for as long as 30 days. On 9/15 he was seen by his primary felt to have cellulitis with open wounds. He currently has 7 wounds the remnant of the initial contusion injury on the right medial knee which actually I think is actually quite a bit smaller than it was looking at a picture on his wife's cell phone. He also has 6 is wounds on the right lower extremity which includes 2 on the right anterior 3 on the right lateral and one posteriorly. He has compression stockings but has not been wearing them. He says these latter 6 wounds started as blisters that open. He has been using Neosporin. Past medical history; type 2 diabetes apparently well controlled, chronic atrial fibrillation, edema, systolic and diastolic congestive heart failure, cardiomyopathy, sleep apnea history of non-Hodgkin's lymphoma in his chest but he was treated with chemotherapy and did not use or receive radiation ABI in our clinic was 1.14 on the right Patient History Information obtained from Patient. Allergies No Known Drug Allergies Family History Cancer - Father,Maternal Grandparents,Child, Diabetes - Father,Paternal Grandparents, Heart Disease - Paternal Grandparents,Mother, Hypertension - Mother, Lung  Disease - Maternal Grandparents, Stroke - Maternal Grandparents, No family history of Hereditary Spherocytosis, Kidney Disease, Seizures, Thyroid Problems, Tuberculosis. Social History Former smoker - quit in 1992, Marital Status - Married, Alcohol Use - Rarely, Drug Use - No History, Caffeine Use - Moderate. Medical History Respiratory Patient has history of Sleep Apnea Cardiovascular Patient has history of Congestive Heart Failure, Hypertension Endocrine Patient has history of Type II Diabetes Denies history of Type I Diabetes Neurologic Patient has history of Neuropathy Oncologic Patient has history of Received Chemotherapy Denies history of Received Radiation Psychiatric Denies history of Anorexia/bulimia, Confinement Anxiety Patient is treated with Oral Agents. Blood sugar is not tested. Medical And Surgical History Notes Cardiovascular Cardiomyopathy, Hyperlipidemia Oncologic 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 Review of Systems (ROS) Constitutional Symptoms (General Health) Denies complaints or symptoms of Fatigue, Fever, Chills, Marked Weight Change. Eyes Denies complaints or symptoms of Dry Eyes,  Vision Changes, Glasses / Contacts. Ear/Nose/Mouth/Throat Denies complaints or symptoms of Chronic sinus problems or rhinitis. Gastrointestinal Denies complaints or symptoms of Frequent diarrhea, Nausea, Vomiting. Genitourinary Denies complaints or symptoms of Frequent urination. Integumentary (Skin) Complains or has symptoms of Wounds - 7 wounds on right lower leg. Musculoskeletal Denies complaints or symptoms of Muscle Pain, Muscle Weakness. Neurologic Denies complaints or symptoms of Numbness/parasthesias. Psychiatric Complains or has symptoms of Claustrophobia. Denies complaints or symptoms of Suicidal. Objective Constitutional Vitals Time Taken: 1:28 PM, Height: 77 in, Source: Stated, Weight: 350 lbs, Source: Stated, BMI: 41.5, Temperature: 98.3 F, Pulse: 56  bpm, Respiratory Rate: 18 breaths/min, Blood Pressure: 132/52 mmHg. Eyes Conjunctivae clear. No discharge.no icterus. Respiratory work of breathing is normal. Bilateral breath sounds are clear and equal in all lobes with no wheezes, rales or rhonchi.. Cardiovascular Heart rhythm and rate regular, without murmur or gallop. JVP not elevated. Pulses palpable at the dorsalis pedis and posterior tibia.. Edema present in both extremities. This is nonpitting. Extensive skin changes on the right leg suggestive of chronic lymphedema with elephantiasis stage IV. Similar but lesser degrees of change on the left leg. Right leg is a lot more swollen than left. Gastrointestinal (GI) Abdomen is soft and non-distended without masses or tenderness.Marland Kitchen Psychiatric appears at normal baseline. General Notes: Wound exam; the area on the right medial knee was covered by thick adherent black eschar. Using pickups and a #10 scalpel this was removed then using a #5 curette the surface was debrided of very adherent necrotic debris. I was able to get down to mostly epithelium although this is probably still 40% slough covered ooMultiple wounds on the right lower leg which are superficial remanence of blisters with uncontrolled edema. No debridement was required in any of these sites Integumentary (Hair, Skin) Changes of chronic venous insufficiency in the right greater than left leg. Wound #1 status is Open. Original cause of wound was Trauma. The wound is located on the Right,Medial Knee. The wound measures 5.6cm length x 6.8cm width x 0.1cm depth; 29.908cm^2 area and 2.991cm^3 volume. There is no tunneling or undermining noted. There is a medium amount of drainage noted. The wound margin is flat and intact. There is no granulation within the wound bed. There is a large (67-100%) amount of necrotic tissue within the wound bed including Eschar. Wound #2 status is Open. Original cause of wound was Blister. The wound  is located on the Right,Proximal,Anterior Lower Leg. The wound measures 1.2cm length x 3.6cm width x 0.1cm depth; 3.393cm^2 area and 0.339cm^3 volume. There is Fat Layer (Subcutaneous Tissue) Exposed exposed. There is no tunneling or undermining noted. There is a small amount of serous drainage noted. The wound margin is flat and intact. There is large (67-100%) red granulation within the wound bed. There is no necrotic tissue within the wound bed. Wound #3 status is Open. Original cause of wound was Blister. The wound is located on the Right,Distal,Anterior Lower Leg. The wound measures 2cm length x 1.3cm width x 0.1cm depth; 2.042cm^2 area and 0.204cm^3 volume. There is Fat Layer (Subcutaneous Tissue) Exposed exposed. There is no tunneling or undermining noted. There is a medium amount of serous drainage noted. The wound margin is flat and intact. There is large (67-100%) pink granulation within the wound bed. There is no necrotic tissue within the wound bed. Wound #4 status is Open. Original cause of wound was Blister. The wound is located on the Right,Proximal,Lateral Lower Leg. The wound measures 4.6cm length x 3.2cm width  x 0.1cm depth; 11.561cm^2 area and 1.156cm^3 volume. There is Fat Layer (Subcutaneous Tissue) Exposed exposed. There is no tunneling or undermining noted. There is a medium amount of serosanguineous drainage noted. The wound margin is flat and intact. There is large (67-100%) pink granulation within the wound bed. There is no necrotic tissue within the wound bed. Wound #5 status is Open. Original cause of wound was Blister. The wound is located on the Right,Lateral Lower Leg. The wound measures 5cm length x 4.5cm width x 0.1cm depth; 17.671cm^2 area and 1.767cm^3 volume. There is Fat Layer (Subcutaneous Tissue) Exposed exposed. There is no tunneling or undermining noted. There is a medium amount of serosanguineous drainage noted. The wound margin is flat and intact. There is  large (67-100%) pink granulation within the wound bed. There is no necrotic tissue within the wound bed. Wound #6 status is Open. Original cause of wound was Blister. The wound is located on the Right,Distal Lower Leg. The wound measures 3.5cm length x 4.5cm width x 0.1cm depth; 12.37cm^2 area and 1.237cm^3 volume. There is Fat Layer (Subcutaneous Tissue) Exposed exposed. There is no tunneling or undermining noted. There is a medium amount of serosanguineous drainage noted. The wound margin is flat and intact. There is large (67-100%) red granulation within the wound bed. There is no necrotic tissue within the wound bed. Wound #7 status is Open. Original cause of wound was Blister. The wound is located on the Right,Posterior Lower Leg. The wound measures 5cm length x 6.3cm width x 0.1cm depth; 24.74cm^2 area and 2.474cm^3 volume. There is Fat Layer (Subcutaneous Tissue) Exposed exposed. There is no tunneling or undermining noted. There is a medium amount of serosanguineous drainage noted. The wound margin is flat and intact. There is large (67-100%) red granulation within the wound bed. There is no necrotic tissue within the wound bed. 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 There was a Excisional Skin/Subcutaneous Tissue Debridement with a total area of 38.08 sq cm performed by Ricard Dillon., MD. With the following instrument(s): Curette to remove Viable and Non-Viable  tissue/material. Material removed includes Eschar, Subcutaneous Tissue, Slough, and Fibrin/Exudate after achieving pain control using Lidocaine 4% Topical Solution. A time out was conducted at 14:40, prior to the start of the procedure. There was no bleeding. The procedure was tolerated well with a pain level of 0 throughout and a pain level of 0 following the procedure. Post Debridement Measurements: 5.6cm length x 6.8cm width x 0.1cm depth; 2.991cm^3 volume. Character of Wound/Ulcer Post Debridement is improved. Post procedure Diagnosis Wound #1: Same as Pre-Procedure Pre-procedure diagnosis of Wound #1 is a Trauma, Other located on the Right,Medial Knee . There was a Four Layer Compression Therapy Procedure with a pre-treatment ABI of 1.1 by Baruch Gouty, RN. Post procedure Diagnosis Wound #1: Same as Pre-Procedure Wound #2 Pre-procedure diagnosis of Wound #2 is a Venous Leg Ulcer located on the Right,Proximal,Anterior Lower Leg . There was a Four Layer Compression Therapy Procedure with a pre-treatment ABI of 1.1 by Baruch Gouty, RN. Post procedure Diagnosis Wound #2: Same  as Pre-Procedure Wound #3 Pre-procedure diagnosis of Wound #3 is a Venous Leg Ulcer located on the Right,Distal,Anterior Lower Leg . There was a Four Layer Compression Therapy Procedure with a pre-treatment ABI of 1.1 by Baruch Gouty, RN. Post procedure Diagnosis Wound #3: Same as Pre-Procedure Wound #4 Pre-procedure diagnosis of Wound #4 is a Venous Leg Ulcer located on the Right,Proximal,Lateral Lower Leg . There was a Four Layer Compression Therapy Procedure with a pre-treatment ABI of 1.1 by Baruch Gouty, RN. Post procedure Diagnosis Wound #4: Same as Pre-Procedure Wound #5 Pre-procedure diagnosis of Wound #5 is a Venous Leg Ulcer located on the Right,Lateral Lower Leg . There was a Four Layer Compression Therapy Procedure with a pre-treatment ABI of 1.1 by Baruch Gouty, RN. Post procedure Diagnosis  Wound #5: Same as Pre-Procedure Wound #6 Pre-procedure diagnosis of Wound #6 is a Venous Leg Ulcer located on the Right,Distal Lower Leg . There was a Four Layer Compression Therapy Procedure with a pre-treatment ABI of 1.1 by Baruch Gouty, RN. Post procedure Diagnosis Wound #6: Same as Pre-Procedure Wound #7 Pre-procedure diagnosis of Wound #7 is a Venous Leg Ulcer located on the Right,Posterior Lower Leg . There was a Four Layer Compression Therapy Procedure with a pre-treatment ABI of 1.1 by Baruch Gouty, RN. Post procedure Diagnosis Wound #7: Same as Pre-Procedure There was a Four Layer Compression Therapy Procedure by Deon Pilling, RN. Post procedure Diagnosis Wound #: Same as Pre-Procedure Plan Follow-up Appointments: Return Appointment in 1 week. Dressing Change Frequency: Wound #1 Right,Medial Knee: Change dressing every day. - home health to change once a week. wound center to change on Thursdays. Patient to change all other days. Wound #2 Right,Proximal,Anterior Lower Leg: Other: - home health to change once a week. wound center to change on Thursdays. Wound #3 Right,Distal,Anterior Lower Leg: Other: - home health to change once a week. wound center to change on Thursdays. Wound #4 Right,Proximal,Lateral Lower Leg: Other: - home health to change once a week. wound center to change on Thursdays. Wound #5 Right,Lateral Lower Leg: Other: - home health to change once a week. wound center to change on Thursdays. Wound #6 Right,Distal Lower Leg: Other: - home health to change once a week. wound center to change on Thursdays. Wound #7 Right,Posterior Lower Leg: Other: - home health to change once a week. wound center to change on Thursdays. Skin Barriers/Peri-Wound Care: Moisturizing lotion - lotion to both legs. Wound Cleansing: Wound #1 Right,Medial Knee: Other: - may wash with soap and water. Wound #2 Right,Proximal,Anterior Lower Leg: May shower with protection. -  home health may wash legs with soap and water when dressing changes. Wound #3 Right,Distal,Anterior Lower Leg: May shower with protection. - home health may wash legs with soap and water when dressing changes. Wound #4 Right,Proximal,Lateral Lower Leg: May shower with protection. - home health may wash legs with soap and water when dressing changes. Wound #5 Right,Lateral Lower Leg: May shower with protection. - home health may wash legs with soap and water when dressing changes. Wound #6 Right,Distal Lower Leg: May shower with protection. - home health may wash legs with soap and water when dressing changes. Wound #7 Right,Posterior Lower Leg: May shower with protection. - home health may wash legs with soap and water when dressing changes. Primary Wound Dressing: Wound #1 Right,Medial Knee: Santyl Ointment Wound #2 Right,Proximal,Anterior Lower Leg: Calcium Alginate with Silver Wound #3 Right,Distal,Anterior Lower Leg: Calcium Alginate with Silver Wound #4 Right,Proximal,Lateral Lower Leg: Calcium Alginate with Silver  Wound #5 Right,Lateral Lower Leg: Calcium Alginate with Silver Wound #6 Right,Distal Lower Leg: Calcium Alginate with Silver Wound #7 Right,Posterior Lower Leg: Calcium Alginate with Silver Secondary Dressing: Wound #1 Right,Medial Knee: Kerlix/Rolled Gauze - netting to secure dressing. ABD pad Wound #2 Right,Proximal,Anterior Lower Leg: ABD pad Kerramax - or super absorptive pad. Wound #3 Right,Distal,Anterior Lower Leg: ABD pad Kerramax - or super absorptive pad. Wound #4 Right,Proximal,Lateral Lower Leg: ABD pad Kerramax - or super absorptive pad. Wound #5 Right,Lateral Lower Leg: ABD pad Kerramax - or super absorptive pad. Wound #6 Right,Distal Lower Leg: ABD pad Kerramax - or super absorptive pad. Wound #7 Right,Posterior Lower Leg: ABD pad Kerramax - or super absorptive pad. Edema Control: 4 layer compression - Bilateral Avoid standing for long  periods of time Elevate legs to the level of the heart or above for 30 minutes daily and/or when sitting, a frequency of: - throughout the day. Exercise regularly Home Health: Admit to [facility] for [place] - home health to change once a week. wound center to change on Thursdays. The following medication(s) was prescribed: lidocaine topical 4 % gel gel topical applied prior to debridement by MD. was prescribed at facility Santyl topical 250 unit/gram ointment ointment topical to wound right knee change daily starting 07/18/2019 1. After extensive debridement we are going to apply Santyl change daily to the traumatic wound on the medial knee 2. Silver alginate/Kerramax/ABDs and 4 layer compression on bilateral lower legs 3. We need to get the edema controlled on both sides and then he will need compression stockings going forward. 4. We emphasized keeping his legs elevated. He sleeps in a recliner but can keep his legs elevated on the foot rest 5. Emphasized a exercise program walking to help promote edema fluid leaving the leg Electronic Signature(s) Signed: 07/22/2019 6:11:18 PM By: Linton Ham MD Signed: 07/22/2019 6:17:12 PM By: Levan Hurst RN, BSN Previous Signature: 07/18/2019 3:46:57 PM Version By: Linton Ham MD Entered By: Levan Hurst on 07/22/2019 07:44:38 -------------------------------------------------------------------------------- HxROS Details Patient Name: Date of Service: JAILYN, LANGHORST 07/18/2019 1:15 PM Medical Record [id number] Patient Account Number: [id number] Date of Birth/Sex: Treating RN: 1946/03/03 (73 y.o. Janyth Contes Primary Care Provider: [name] Other Clinician: Referring Provider: Treating Provider/Extender:Robson, [name], [name] in Treatment: 0 Information Obtained From Patient Constitutional Symptoms (General Health) Complaints and Symptoms: Negative for: Fatigue; Fever; Chills; Marked  Weight Change Eyes Complaints and Symptoms: Negative for: Dry Eyes; Vision Changes; Glasses / Contacts Ear/Nose/Mouth/Throat Complaints and Symptoms: Negative for: Chronic sinus problems or rhinitis Gastrointestinal Complaints and Symptoms: Negative for: Frequent diarrhea; Nausea; Vomiting Genitourinary Complaints and Symptoms: Negative for: Frequent urination Integumentary (Skin) Complaints and Symptoms: Positive for: Wounds - 7 wounds on right lower leg Musculoskeletal Complaints and Symptoms: Negative for: Muscle Pain; Muscle Weakness Neurologic Complaints and Symptoms: Negative for: Numbness/parasthesias Medical History: Positive for: Neuropathy Psychiatric Complaints and Symptoms: Positive for: Claustrophobia Negative for: Suicidal Medical History: Negative for: Anorexia/bulimia; Confinement Anxiety Hematologic/Lymphatic Respiratory Medical History: Positive for: Sleep Apnea Cardiovascular Medical History: Positive for: Congestive Heart Failure; Hypertension Past Medical History Notes: Cardiomyopathy, Hyperlipidemia Endocrine Medical History: Positive for: Type II Diabetes Negative for: Type I Diabetes Time with diabetes: 8 years Treated with: Oral agents Blood sugar tested every day: No Immunological Oncologic Medical History: Positive for: Received Chemotherapy Negative for: Received Radiation Past Medical History Notes: 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 Immunizations Pneumococcal Vaccine: Received Pneumococcal Vaccination: Yes Implantable Devices None Family and Social History Cancer:  Yes - Father,Maternal Grandparents,Child; Diabetes: Yes - Father,Paternal Grandparents; Heart Disease: Yes - Paternal Grandparents,Mother; Hereditary Spherocytosis: No; Hypertension: Yes - Mother; Kidney Disease: No; Lung Disease: Yes - Maternal Grandparents; Seizures: No; Stroke: Yes - Maternal Grandparents; Thyroid Problems: No; Tuberculosis: No; Former smoker -  quit in 1992; Marital Status - Married; Alcohol Use: Rarely; Drug Use: No History; Caffeine Use: Moderate; Financial Concerns: No; Food, Clothing or Shelter Needs: No; Support System Lacking: No; Transportation Concerns: No Electronic Signature(s) Signed: 07/18/2019 6:13:48 PM By: Linton Ham MD Signed: 07/22/2019 6:17:12 PM By: Levan Hurst RN, BSN Entered By: Levan Hurst on 07/18/2019 14:00:59 -------------------------------------------------------------------------------- [facility] Details Patient Name: Date of Service: ANTOINE, VANDERMEULEN 07/18/2019 Medical Record [phone] Patient Account Number: [id number] Date of Birth/Sex: Treating RN: [birth year] (73 y.o. 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 E11.622 Type 2 diabetes mellitus with other skin ulcer Facility Procedures The patient participates with Medicare or their insurance follows the Medicare Facility Guidelines: CPT4 Description Modifier Quantity Code [id number] - WOUND CARE VISIT-LEV 4 EST PT 1 The patient participates with Medicare or their insurance follows the Medicare Facility Guidelines: [id number] - DEB SUBQ TISSUE 20 SQ CM/< 1 ICD-10 Diagnosis Description L97.812 Non-pressure chronic ulcer of other part of right lower leg with fat  layer exposed S81.011D Laceration without foreign body, right knee, subsequent encounter The  patient participates with Medicare or their insurance follows the Medicare Facility Guidelines: [id number] - DEB SUBQ TISS EA ADDL 20CM 1 ICD-10 Diagnosis Description L97.812 Non-pressure chronic ulcer of other part of right lower leg with fat  layer exposed S81.011D Laceration without foreign body, right knee, subsequent encounter The patient participates with Medicare or their insurance follows the Medicare Facility Guidelines: [id number] BILATERAL: Application of multi-layer venous compression system; leg 59 1 (below knee), including ankle and foot. Physician Procedures CPT4 Code Description: 0254270 [id number] - WC PHYS LEVEL 4 - NEW PT ICD-10 Diagnosis Description I89.0 Lymphedema, not elsewhere classified W23.762 Non-pressure chronic ulcer of other part of right lower le L97.811 Non-pressure chronic ulcer of other part of  right lower le skin I87.323 Chronic venous hypertension (idiopathic) with inflammation extremity Modifier: 25 g with fat lay g limited to b of bilateral Quantity: 1 er exposed reakdown of lower CPT4 Code Description: [id number] - WC PHYS SUBQ TISS 20 SQ CM ICD-10 Diagnosis Description L97.812 Non-pressure chronic ulcer of other part of right lower leg w S81.011D Laceration without foreign body, right knee, subsequent encou Modifier: ith fat layer nter Quantity: 1 exposed CPT4 Code Description: [id number] - WC PHYS SUBQ TISS EA ADDL 20 CM ICD-10 Diagnosis Description E70.350 Non-pressure chronic ulcer of other part of right lower leg w S81.011D Laceration without foreign body, right knee, subsequent encou Modifier: ith fat layer nter Quantity: 1 exposed Electronic Signature(s) Signed: 07/18/2019 6:13:48 PM By: Linton Ham MD Signed: 07/18/2019 6:31:45 PM By: Deon Pilling Entered By: Deon Pilling on 07/18/2019 18:00:48

## 2019-07-25 ENCOUNTER — Other Ambulatory Visit: Payer: Self-pay

## 2019-07-25 ENCOUNTER — Encounter (HOSPITAL_BASED_OUTPATIENT_CLINIC_OR_DEPARTMENT_OTHER): Payer: Medicare Other | Admitting: Internal Medicine

## 2019-07-25 DIAGNOSIS — I890 Lymphedema, not elsewhere classified: Secondary | ICD-10-CM | POA: Diagnosis not present

## 2019-07-25 DIAGNOSIS — L97212 Non-pressure chronic ulcer of right calf with fat layer exposed: Secondary | ICD-10-CM | POA: Diagnosis not present

## 2019-07-25 DIAGNOSIS — L97812 Non-pressure chronic ulcer of other part of right lower leg with fat layer exposed: Secondary | ICD-10-CM | POA: Diagnosis not present

## 2019-07-25 DIAGNOSIS — I87323 Chronic venous hypertension (idiopathic) with inflammation of bilateral lower extremity: Secondary | ICD-10-CM | POA: Diagnosis not present

## 2019-07-25 DIAGNOSIS — S81011A Laceration without foreign body, right knee, initial encounter: Secondary | ICD-10-CM | POA: Diagnosis not present

## 2019-07-25 DIAGNOSIS — I87311 Chronic venous hypertension (idiopathic) with ulcer of right lower extremity: Secondary | ICD-10-CM | POA: Diagnosis not present

## 2019-07-25 DIAGNOSIS — L97819 Non-pressure chronic ulcer of other part of right lower leg with unspecified severity: Secondary | ICD-10-CM | POA: Diagnosis not present

## 2019-07-25 DIAGNOSIS — E11622 Type 2 diabetes mellitus with other skin ulcer: Secondary | ICD-10-CM | POA: Diagnosis not present

## 2019-07-25 NOTE — Progress Notes (Signed)
GURKARAN, RAHM ([id number]) Visit Report for 07/25/2019 Debridement Details Patient Name: Date of Service: OSIAS, RESNICK 07/25/2019 2:00 PM Medical Record [id number] Patient Account Number: [id number] Date of Birth/Sex: Treating RN: [birth year] (73 y.o. [name] Primary Care Provider: [name] Other Clinician: Referring Provider: Treating Provider/Extender:Robson, [name], [name] in Treatment: 1 Debridement Performed for Wound #1 Right,Medial Knee Assessment: Performed By: Clinician Kela Millin, RN Debridement Type: Chemical/Enzymatic/Mechanical Agent Used: Santyl Level of Consciousness (Pre- Awake and Alert procedure): Pre-procedure Verification/Time Out Taken: Yes - 15:06 Start Time: 15:07 Pain Control: Lidocaine 4% Topical Solution Bleeding: None End Time: 15:08 Procedural Pain: 0 Post Procedural Pain: 0 Response to Treatment: Procedure was tolerated well Level of Consciousness Awake and Alert (Post-procedure): Post Debridement Measurements of Total Wound Length: (cm) 3.5 Width: (cm) 4 Depth: (cm) 0.1 Volume: (cm) 1.1 Character of Wound/Ulcer Post Improved Debridement: Post Procedure Diagnosis Same as Pre-procedure Electronic Signature(s) Signed: 07/25/2019 5:49:31 PM By: Linton Ham MD Signed: 07/25/2019 6:06:48 PM By: Deon Pilling Entered By: Deon Pilling on 07/25/2019 15:07:53 -------------------------------------------------------------------------------- HPI Details Patient Name: Date of Service: [name] 07/25/2019 2:00 PM Medical Record [id number] Patient Account Number: [id number] Date of Birth/Sex: Treating RN: 06-Dec-1945 (73 y.o. [name] Primary Care Provider: [name] Other Clinician: Referring Provider: Treating Provider/Extender:Robson, [name], [name] in Treatment: 1 History of Present Illness HPI Description: ADMISSION 07/18/2019 Patient is a  73 year old man with type 2 diabetes and also chronic edema. He had a fall in July. Fairly substantial wound on the right medial knee and across the top of the patella. This is actually made some progress since then. He was seen in the ED on 8/18 with cellulitis. He was treated with doxycycline. This got extended I think for as long as 30 days. On 9/15 he was seen by his primary felt to have cellulitis with open wounds. He currently has 7 wounds the remnant of the initial contusion injury on the right medial knee which actually I think is actually quite a bit smaller than it was looking at a picture on his wife's cell phone. He also has 6 is wounds on the right lower extremity which includes 2 on the right anterior 3 on the right lateral and one posteriorly. He has compression stockings but has not been wearing them. He says these latter 6 wounds started as blisters that open. He has been using Neosporin. 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 He also has a laceration wound on the lower medial left thigh just above the knee. We have been using Santyl on the latter, silver alginate on the wounds on the right. The patient does not have an open wound on the left and he has a lot less edema. On the left side. We have given him the number for elastic therapy in  for 20/30 stockings on the left leg. I am not certain that that will suffice on the right however Electronic Signature(s) Signed: 07/25/2019 5:49:31 PM By: Linton Ham MD Entered By: Linton Ham on 07/25/2019  15:18:31 -------------------------------------------------------------------------------- Physical Exam Details Patient Name: Date of Service: Ma Rings C. 07/25/2019 2:00 PM  Medical Record [id number] Patient Account Number: [id number] Date of Birth/Sex: Treating RN: 1946/10/04 (73 y.o. [name] Primary Care Provider: [name] Other Clinician: Referring Provider: Treating Provider/Extender:Robson, [name], [name] in Treatment: 1 Constitutional Sitting or standing Blood Pressure is within target range for patient.. Pulse regular and within target range for patient.Marland Kitchen Respirations regular, non-labored and within target range.. Temperature is normal and within the target range for the patient.Marland Kitchen Appears in no distress. Eyes Conjunctivae clear. No discharge.no icterus. Cardiovascular . Pedal pulses are palpable. Right greater than left lymphedema changes of chronic venous insufficiency. Integumentary (Hair, Skin) Chronic venous insufficiency on the left. Notes Wound exam; the area on the right medial knee was covered by thick adherent black eschar last week. This was debrided. There is still a moderate amount of surface material here although I did not debride this today preferring to give Santyl another week to loosen this up. On the right leg anteriorly laterally and posteriorly the wounds actually look quite good. Although more superficial there are rims of epithelialization. Electronic Signature(s) Signed: 07/25/2019 5:49:31 PM By: Linton Ham MD Entered By: Linton Ham on 07/25/2019 15:20:18 -------------------------------------------------------------------------------- Physician Orders Details Patient Name: Date of Service: [name] 07/25/2019 2:00 PM Medical Record [id number] Patient Account Number: [id number] Date of Birth/Sex: Treating RN: 1946/02/23 (73 y.o. [name], Tammi Klippel Primary Care Provider: [name] Other Clinician: Referring Provider: Treating Provider/Extender:Robson, [name], [name] in Treatment: 1 Verbal / Phone Orders: No Diagnosis Coding ICD-10 Coding Code Description I89.0 Lymphedema, not elsewhere classified S81.011D Laceration without foreign body, right knee, subsequent encounter L97.812 Non-pressure chronic ulcer of other part of right lower leg with fat layer exposed L97.811 Non-pressure chronic ulcer of other part of right lower leg limited to breakdown of skin I87.323 Chronic venous hypertension (idiopathic) with inflammation of bilateral lower extremity E11.622 Type 2 diabetes mellitus with other skin ulcer Follow-up Appointments Return Appointment in 1 week. Dressing Change Frequency Wound #1 Right,Medial Knee Change dressing every day. - home health to change once a week. wound center to change on Thursdays. Patient to change all other days. Home Health to ensure patient has secondary dressings. Wound #2 Right,Proximal,Anterior Lower Leg Other: - home health to change once a week. wound center to change on Thursdays. Wound #3 Right,Distal,Anterior Lower Leg Other: - home health to change once a week. wound center to change on Thursdays. Wound #6 Right,Distal Lower Leg Other: - home health to change once a week. wound center to change on Thursdays. Wound #7 Right,Posterior Lower Leg Other: - home health to change once a week. wound center to change on Thursdays. Skin Barriers/Peri-Wound Care Moisturizing lotion - lotion to both legs. Wound Cleansing Wound #1 Right,Medial Knee Other: - may wash with soap and water. Wound #2 Right,Proximal,Anterior Lower Leg May shower with protection. - home health may wash legs with soap and water when dressing changes. Wound #3 Right,Distal,Anterior Lower Leg May shower with protection. - home health may wash legs with soap and water when dressing changes. Wound #6 Right,Distal Lower Leg May shower with  protection. - home health may wash legs with soap and water when dressing changes. Wound #7 Right,Posterior Lower Leg May shower with protection. - home health may wash legs with soap and water when dressing changes. Primary Wound Dressing Wound #1 Right,Medial Knee Santyl Ointment Wound #2 Right,Proximal,Anterior Lower Leg Calcium Alginate with Silver Wound #3 Right,Distal,Anterior Lower Leg Calcium Alginate with Silver Wound #6 Right,Distal Lower Leg  Calcium Alginate with Silver Wound #7 Right,Posterior Lower Leg Calcium Alginate with Silver Secondary Dressing Wound #1 Right,Medial Knee Kerlix/Rolled Gauze - netting to secure dressing. ABD pad Wound #2 Right,Proximal,Anterior Lower Leg ABD pad Kerramax - or super absorptive pad. Wound #3 Right,Distal,Anterior Lower Leg ABD pad Kerramax - or super absorptive pad. Wound #6 Right,Distal Lower Leg ABD pad Kerramax - or super absorptive pad. Wound #7 Right,Posterior Lower Leg ABD pad Kerramax - or super absorptive pad. Edema Control 4 layer compression - Bilateral Avoid standing for long periods of time Elevate legs to the level of the heart or above for 30 minutes daily and/or when sitting, a frequency of: - throughout the day. Exercise regularly Support Garment 20-30 mm/Hg pressure to: - patient to call [facility] to purchase compression stockings for left leg. Bring in stockings next week for wound center to apply. [place] skilled nursing for wound care. [facility] to change once a week. wound center to change on Thursdays. Electronic Signature(s) Signed: 07/25/2019 5:49:31 PM By: Linton Ham MD Signed: 07/25/2019 6:06:48 PM By: Deon Pilling Entered By: Deon Pilling on 07/25/2019 17:25:31 -------------------------------------------------------------------------------- Problem List Details Patient Name: Date of Service: [name] 07/25/2019 2:00 PM Medical Record  [id number] Patient Account Number: [id number] Date of Birth/Sex: Treating RN: 08-30-1946 (73 y.o. [name], Meta.Reding Primary Care Provider: [name] Other Clinician: Referring Provider: Treating Provider/Extender:Robson, [name], [name] in Treatment: 1 Active Problems ICD-10 Evaluated Encounter Code Description Active Date Today Diagnosis I89.0 Lymphedema, not elsewhere classified 07/18/2019 No Yes S81.011D Laceration without foreign body, right knee, 07/18/2019 No Yes subsequent encounter L97.812 Non-pressure chronic ulcer of other part of right lower 07/18/2019 No Yes leg with fat layer exposed L97.811 Non-pressure chronic ulcer of other part of right lower 07/18/2019 No Yes leg limited to breakdown of skin I87.323 Chronic venous hypertension (idiopathic) with 07/18/2019 No Yes inflammation of bilateral lower extremity E11.622 Type 2 diabetes mellitus with other skin ulcer 07/18/2019 No Yes Inactive Problems Resolved Problems Electronic Signature(s) Signed: 07/25/2019 5:49:31 PM By: Linton Ham MD Entered By: Linton Ham on 07/25/2019 15:14:17 -------------------------------------------------------------------------------- Progress Note Details Patient Name: Date of Service: [name] 07/25/2019 2:00 PM Medical Record [id number] Patient Account Number: [id number] Date of Birth/Sex: Treating RN: Aug 13, 1946 (73 y.o. [name] Primary Care Provider: [name] Other Clinician: Referring Provider: Treating Provider/Extender:Robson, [name], [name] in Treatment: 1 Subjective History of Present Illness (HPI) ADMISSION 07/18/2019 Patient is a 73 year old man with type 2 diabetes and also chronic edema. He had a fall in July. Fairly substantial wound on the right medial knee and across the top of the patella. This is actually made some progress since then. He was seen in the ED on 8/18 with cellulitis. He was treated  with doxycycline. This got extended I think for as long as 30 days. On 9/15 he was seen by his primary felt to have cellulitis with open wounds. He currently has 7 wounds the remnant of the initial contusion injury on the right medial knee which actually I think is actually quite a bit smaller than it was looking at a picture on his wife's cell phone. He also has 6 is wounds on the right lower extremity which includes 2 on the right anterior 3 on the right lateral and one posteriorly. He has compression stockings but has not been wearing them. He says these latter 6 wounds started as blisters that open. He has  been using Neosporin. 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 He also has a laceration wound on the lower medial left thigh just above the knee. We have been using Santyl on the latter, silver alginate on the wounds on the right. The patient does not have an open wound on the left and he has a lot less edema. On the left side. We have given him the number for elastic therapy in [place] for 20/30 stockings on the left leg. I am not certain that that will suffice on the right however Objective Constitutional Sitting or standing Blood Pressure is within target range for patient.. Pulse regular and within target range for patient.Marland Kitchen Respirations regular, non-labored and within target range.. Temperature is normal and within the target range for the patient.Marland Kitchen Appears in no distress. Vitals Time Taken: 2:30 PM, Height: 77 in, Weight: 350 lbs, BMI: 41.5, Temperature: 98.9 F, Pulse: 55  bpm, Respiratory Rate: 18 breaths/min, Blood Pressure: 141/55 mmHg. Eyes Conjunctivae clear. No discharge.no icterus. Cardiovascular Pedal pulses are palpable. Right greater than left lymphedema changes of chronic venous insufficiency. General Notes: Wound exam; the area on the right medial knee was covered by thick adherent black eschar last week. This was debrided. There is still a moderate amount of surface material here although I did not debride this today preferring to give Santyl another week to loosen this up. On the right leg anteriorly laterally and posteriorly the wounds actually look quite good. Although more superficial there are rims of epithelialization. Integumentary (Hair, Skin) Chronic venous insufficiency on the left. Wound #1 status is Open. Original cause of wound was Trauma. The wound is located on the Right,Medial Knee. The wound measures 3.5cm length x 4cm width x 0.1cm depth; 10.996cm^2 area and 1.1cm^3 volume. There is no tunneling or undermining noted. There is a medium amount of serosanguineous drainage noted. The wound margin is flat and intact. There is medium (34-66%) red, pink granulation within the wound bed. There is a medium (34- 66%) amount of necrotic tissue within the wound bed including Adherent Slough. Wound #2 status is Open. Original cause of wound was Blister. The wound is located on the Right,Proximal,Anterior Lower Leg. The wound measures 0.6cm length x 1cm width x 0.1cm depth; 0.471cm^2 area and 0.047cm^3 volume. There is Fat Layer (Subcutaneous Tissue) Exposed exposed. There is no tunneling or undermining noted. There is a small amount of serous drainage noted. The wound margin is flat and intact. There is large (67-100%) red granulation within the wound bed. There is a small (1-33%) amount of necrotic tissue within the wound bed including Adherent Slough. Wound #3 status is Open. Original cause of wound was Blister. The wound is located on the  Right,Distal,Anterior Lower Leg. The wound measures 1cm length x 0.4cm width x 0.1cm depth; 0.314cm^2 area and 0.031cm^3 volume. There is Fat Layer (Subcutaneous Tissue) Exposed exposed. There is no tunneling or undermining noted. There is a medium amount of serous drainage noted. The wound margin is flat and intact. There is large (67-100%) pink granulation within the wound bed. There is a small (1-33%) amount of necrotic tissue within the wound bed including Adherent Slough. Wound #4  status is Open. Original cause of wound was Blister. The wound is located on the Right,Proximal,Lateral Lower Leg. The wound measures 0cm length x 0cm width x 0cm depth; 0cm^2 area and 0cm^3 volume. Wound #5 status is Open. Original cause of wound was Blister. The wound is located on the Right,Lateral Lower Leg. The wound measures 0cm length x 0cm width x 0cm depth; 0cm^2 area and 0cm^3 volume. Wound #6 status is Open. Original cause of wound was Blister. The wound is located on the Right,Distal Lower Leg. The wound measures 3cm length x 3.9cm width x 0.1cm depth; 9.189cm^2 area and 0.919cm^3 volume. There is Fat Layer (Subcutaneous Tissue) Exposed exposed. There is no tunneling or undermining noted. There is a medium amount of serosanguineous drainage noted. The wound margin is flat and intact. There is large (67-100%) red granulation within the wound bed. There is no necrotic tissue within the wound bed. Wound #7 status is Open. Original cause of wound was Blister. The wound is located on the Right,Posterior Lower Leg. The wound measures 5cm length x 5.3cm width x 0.1cm depth; 20.813cm^2 area and 2.081cm^3 volume. There is Fat Layer (Subcutaneous Tissue) Exposed exposed. There is no tunneling or undermining noted. There is a medium amount of serosanguineous drainage noted. The wound margin is flat and intact. There is large (67-100%) red granulation within the wound bed. There is no necrotic tissue within the  wound bed. 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 There was a Chemical/Enzymatic/Mechanical debridement performed by Kela Millin, RN. after achieving pain control using Lidocaine 4% Topical Solution. Agent used was [employer]. A time out was conducted at 15:06, prior to the start of the procedure. There was no bleeding. The procedure was tolerated well with a pain level of 0 throughout and a pain level of 0 following the procedure. Post Debridement Measurements: 3.5cm length x 4cm width x 0.1cm depth; 1.1cm^3 volume. Character of Wound/Ulcer Post Debridement is improved. Post procedure Diagnosis Wound #1: Same as Pre-Procedure Wound #2 Pre-procedure diagnosis of Wound #2 is a Venous Leg Ulcer located on the Right,Proximal,Anterior Lower Leg . There was a Four Layer Compression Therapy Procedure with a pre-treatment ABI of 1.1 by Kela Millin, RN. Post procedure Diagnosis Wound #2: Same as Pre-Procedure Wound #3 Pre-procedure diagnosis of Wound #3 is a Venous Leg Ulcer located on the Right,Distal,Anterior Lower Leg . There was a Four Layer Compression Therapy Procedure with a pre-treatment ABI of 1.1 by Kela Millin, RN. Post procedure Diagnosis Wound #3: Same as Pre-Procedure Wound #6 Pre-procedure diagnosis of Wound #6 is a Venous Leg Ulcer located on the Right,Distal Lower Leg . There was a Four Layer Compression Therapy Procedure with a pre-treatment ABI of 1.1 by  Kela Millin, RN. Post procedure Diagnosis Wound #6: Same as Pre-Procedure Wound #7 Pre-procedure diagnosis of Wound #7 is a Venous Leg Ulcer located on the Right,Posterior Lower Leg . There was a Four Layer Compression Therapy Procedure with a pre-treatment ABI of 1.1 by Kela Millin, RN. Post procedure Diagnosis Wound #7: Same as Pre-Procedure There was a Four Layer  Compression Therapy Procedure by Kela Millin, RN. Post procedure Diagnosis Wound #: Same as Pre-Procedure Plan Follow-up Appointments: Return Appointment in 1 week. Dressing Change Frequency: Wound #1 Right,Medial Knee: Change dressing every day. - home health to change once a week. wound center to change on Thursdays. Patient to change all other days. Home Health to ensure patient has secondary dressings. Wound #2 Right,Proximal,Anterior Lower Leg: Other: - home health to change once a week. wound center to change on Thursdays. Wound #3 Right,Distal,Anterior Lower Leg: Other: - home health to change once a week. wound center to change on Thursdays. Wound #6 Right,Distal Lower Leg: Other: - home health to change once a week. wound center to change on Thursdays. Wound #7 Right,Posterior Lower Leg: Other: - home health to change once a week. wound center to change on Thursdays. Skin Barriers/Peri-Wound Care: Moisturizing lotion - lotion to both legs. Wound Cleansing: Wound #1 Right,Medial Knee: Other: - may wash with soap and water. Wound #2 Right,Proximal,Anterior Lower Leg: May shower with protection. - home health may wash legs with soap and water when dressing changes. Wound #3 Right,Distal,Anterior Lower Leg: May shower with protection. - home health may wash legs with soap and water when dressing changes. Wound #6 Right,Distal Lower Leg: May shower with protection. - home health may wash legs with soap and water when dressing changes. Wound #7 Right,Posterior Lower Leg: May shower with protection.  - home health may wash legs with soap and water when dressing changes. Primary Wound Dressing: Wound #1 Right,Medial Knee: Santyl Ointment Wound #2 Right,Proximal,Anterior Lower Leg: Calcium Alginate with Silver Wound #3 Right,Distal,Anterior Lower Leg: Calcium Alginate with Silver Wound #6 Right,Distal Lower Leg: Calcium Alginate with Silver Wound #7 Right,Posterior Lower Leg: Calcium Alginate with Silver Secondary Dressing: Wound #1 Right,Medial Knee: Kerlix/Rolled Gauze - netting to secure dressing. ABD pad Wound #2 Right,Proximal,Anterior Lower Leg: ABD pad Kerramax - or super absorptive pad. Wound #3 Right,Distal,Anterior Lower Leg: ABD pad Kerramax - or super absorptive pad. Wound #6 Right,Distal Lower Leg: ABD pad Kerramax - or super absorptive pad. Wound #7 Right,Posterior Lower Leg: ABD pad Kerramax - or super absorptive pad. Edema Control: 4 layer compression - Bilateral Avoid standing for long periods of time Elevate legs to the level of the heart or above for 30 minutes daily and/or when sitting, a frequency of: - throughout the day. Exercise regularly Support Garment 20-30 mm/Hg pressure to: - patient to call [facility] to purchase compression stockings for left leg. Bring in stockings next week for wound center to apply. Home Health: [place] [facility] skilled nursing for wound care. [facility] to change once a week. wound center to change on Thursdays. 1. Silver alginate to the wounds on the right lower leg. All of these in the similar condition. They all look better 2. Continue Santyl to the area on the medial thigh just above the knee. 3. He will need compression stockings for the left leg and he will bring them next week from elastic therapy. For this week we will wrap the left leg 4. He has home health [[facility]] changing the dressing Electronic Signature(s) Signed: 07/25/2019 5:49:31 PM By: Linton Ham MD Signed: 07/25/2019 6:06:48 PM  By: Deon Pilling Entered By: Deon Pilling on 07/25/2019 17:25:40 -------------------------------------------------------------------------------- SuperBill Details Patient Name: Date of Service: [name] 07/25/2019 Medical Record [phone] Patient Account Number: [id number] Date of Birth/Sex: Treating RN: 1946/05/26 (73 y.o. [name] Primary Care Provider: [name] Other Clinician: Referring Provider:  Treating Provider/Extender:Robson, [name], [name] in Treatment: 1 Diagnosis Coding ICD-10 Codes Code Description I89.0 Lymphedema, not elsewhere classified S81.011D Laceration without foreign body, right knee, subsequent encounter L97.812 Non-pressure chronic ulcer of other part of right lower leg with fat layer exposed L97.811 Non-pressure chronic ulcer of other part of right lower leg limited to breakdown of skin I87.323 Chronic venous hypertension (idiopathic) with inflammation of bilateral lower extremity E11.622 Type 2 diabetes mellitus with other skin ulcer Facility Procedures The patient participates with Medicare or their insurance follows the Medicare Facility Guidelines: CPT4 Description Modifier Quantity Code [id number] - DEBRIDE W/O ANES NON SELECT 1 The patient participates with Medicare or their insurance follows the Medicare Facility Guidelines: [id number] BILATERAL: Application of multi-layer venous compression system; leg 1 (below knee), including ankle and foot. 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

## 2019-07-30 ENCOUNTER — Encounter (HOSPITAL_BASED_OUTPATIENT_CLINIC_OR_DEPARTMENT_OTHER): Payer: Medicare Other | Admitting: Internal Medicine

## 2019-07-30 ENCOUNTER — Other Ambulatory Visit: Payer: Self-pay

## 2019-07-30 DIAGNOSIS — I890 Lymphedema, not elsewhere classified: Secondary | ICD-10-CM | POA: Diagnosis not present

## 2019-07-30 DIAGNOSIS — L97811 Non-pressure chronic ulcer of other part of right lower leg limited to breakdown of skin: Secondary | ICD-10-CM | POA: Diagnosis not present

## 2019-07-30 DIAGNOSIS — E11622 Type 2 diabetes mellitus with other skin ulcer: Secondary | ICD-10-CM | POA: Diagnosis not present

## 2019-07-30 DIAGNOSIS — S81011A Laceration without foreign body, right knee, initial encounter: Secondary | ICD-10-CM | POA: Diagnosis not present

## 2019-07-30 DIAGNOSIS — L03115 Cellulitis of right lower limb: Secondary | ICD-10-CM | POA: Diagnosis not present

## 2019-07-30 DIAGNOSIS — S81011D Laceration without foreign body, right knee, subsequent encounter: Secondary | ICD-10-CM | POA: Diagnosis not present

## 2019-07-30 DIAGNOSIS — I87311 Chronic venous hypertension (idiopathic) with ulcer of right lower extremity: Secondary | ICD-10-CM | POA: Diagnosis not present

## 2019-07-30 DIAGNOSIS — L97819 Non-pressure chronic ulcer of other part of right lower leg with unspecified severity: Secondary | ICD-10-CM | POA: Diagnosis not present

## 2019-07-30 DIAGNOSIS — I87331 Chronic venous hypertension (idiopathic) with ulcer and inflammation of right lower extremity: Secondary | ICD-10-CM | POA: Diagnosis not present

## 2019-07-30 DIAGNOSIS — I87322 Chronic venous hypertension (idiopathic) with inflammation of left lower extremity: Secondary | ICD-10-CM | POA: Diagnosis not present

## 2019-07-30 DIAGNOSIS — I87323 Chronic venous hypertension (idiopathic) with inflammation of bilateral lower extremity: Secondary | ICD-10-CM | POA: Diagnosis not present

## 2019-07-30 DIAGNOSIS — L97812 Non-pressure chronic ulcer of other part of right lower leg with fat layer exposed: Secondary | ICD-10-CM | POA: Diagnosis not present

## 2019-07-30 DIAGNOSIS — L97211 Non-pressure chronic ulcer of right calf limited to breakdown of skin: Secondary | ICD-10-CM | POA: Diagnosis not present

## 2019-07-30 DIAGNOSIS — L97212 Non-pressure chronic ulcer of right calf with fat layer exposed: Secondary | ICD-10-CM | POA: Diagnosis not present

## 2019-08-01 ENCOUNTER — Encounter (HOSPITAL_BASED_OUTPATIENT_CLINIC_OR_DEPARTMENT_OTHER): Payer: Medicare Other | Admitting: Internal Medicine

## 2019-08-02 DIAGNOSIS — L97211 Non-pressure chronic ulcer of right calf limited to breakdown of skin: Secondary | ICD-10-CM | POA: Diagnosis not present

## 2019-08-02 DIAGNOSIS — L97811 Non-pressure chronic ulcer of other part of right lower leg limited to breakdown of skin: Secondary | ICD-10-CM | POA: Diagnosis not present

## 2019-08-02 DIAGNOSIS — L03115 Cellulitis of right lower limb: Secondary | ICD-10-CM | POA: Diagnosis not present

## 2019-08-02 DIAGNOSIS — S81011D Laceration without foreign body, right knee, subsequent encounter: Secondary | ICD-10-CM | POA: Diagnosis not present

## 2019-08-02 DIAGNOSIS — I87331 Chronic venous hypertension (idiopathic) with ulcer and inflammation of right lower extremity: Secondary | ICD-10-CM | POA: Diagnosis not present

## 2019-08-02 DIAGNOSIS — I87322 Chronic venous hypertension (idiopathic) with inflammation of left lower extremity: Secondary | ICD-10-CM | POA: Diagnosis not present

## 2019-08-05 DIAGNOSIS — L97811 Non-pressure chronic ulcer of other part of right lower leg limited to breakdown of skin: Secondary | ICD-10-CM | POA: Diagnosis not present

## 2019-08-05 DIAGNOSIS — L03115 Cellulitis of right lower limb: Secondary | ICD-10-CM | POA: Diagnosis not present

## 2019-08-05 DIAGNOSIS — I87331 Chronic venous hypertension (idiopathic) with ulcer and inflammation of right lower extremity: Secondary | ICD-10-CM | POA: Diagnosis not present

## 2019-08-05 DIAGNOSIS — L97211 Non-pressure chronic ulcer of right calf limited to breakdown of skin: Secondary | ICD-10-CM | POA: Diagnosis not present

## 2019-08-05 DIAGNOSIS — I87322 Chronic venous hypertension (idiopathic) with inflammation of left lower extremity: Secondary | ICD-10-CM | POA: Diagnosis not present

## 2019-08-05 DIAGNOSIS — S81011D Laceration without foreign body, right knee, subsequent encounter: Secondary | ICD-10-CM | POA: Diagnosis not present

## 2019-08-08 ENCOUNTER — Encounter (HOSPITAL_BASED_OUTPATIENT_CLINIC_OR_DEPARTMENT_OTHER): Payer: Medicare Other | Admitting: Internal Medicine

## 2019-08-08 ENCOUNTER — Other Ambulatory Visit: Payer: Self-pay

## 2019-08-08 DIAGNOSIS — S81001A Unspecified open wound, right knee, initial encounter: Secondary | ICD-10-CM | POA: Diagnosis not present

## 2019-08-08 DIAGNOSIS — L97212 Non-pressure chronic ulcer of right calf with fat layer exposed: Secondary | ICD-10-CM | POA: Diagnosis not present

## 2019-08-08 DIAGNOSIS — I890 Lymphedema, not elsewhere classified: Secondary | ICD-10-CM | POA: Diagnosis not present

## 2019-08-08 DIAGNOSIS — S81011A Laceration without foreign body, right knee, initial encounter: Secondary | ICD-10-CM | POA: Diagnosis not present

## 2019-08-08 DIAGNOSIS — I87323 Chronic venous hypertension (idiopathic) with inflammation of bilateral lower extremity: Secondary | ICD-10-CM | POA: Diagnosis not present

## 2019-08-08 DIAGNOSIS — E11622 Type 2 diabetes mellitus with other skin ulcer: Secondary | ICD-10-CM | POA: Diagnosis not present

## 2019-08-08 DIAGNOSIS — I87311 Chronic venous hypertension (idiopathic) with ulcer of right lower extremity: Secondary | ICD-10-CM | POA: Diagnosis not present

## 2019-08-08 DIAGNOSIS — S81801A Unspecified open wound, right lower leg, initial encounter: Secondary | ICD-10-CM | POA: Diagnosis not present

## 2019-08-08 DIAGNOSIS — L97812 Non-pressure chronic ulcer of other part of right lower leg with fat layer exposed: Secondary | ICD-10-CM | POA: Diagnosis not present

## 2019-08-08 NOTE — Progress Notes (Signed)
SEBRON, MCMAHILL ([id number]) Visit Report for 08/08/2019 HPI Details Patient Name: Date of Service: [name], [name] 08/08/2019 1:30 PM Medical Record [id number] Patient Account Number: [id number] Date of Birth/Sex: Treating RN: Apr 15, 1946 (73 y.o. [name] Primary Care Provider: [name] Other Clinician: Referring Provider: Treating Provider/Extender:Robson, [name], [name] in Treatment: 3 History of Present Illness HPI Description: ADMISSION 07/18/2019 Patient is a 73 year old man with type 2 diabetes and also chronic edema. He had a fall in July. Fairly substantial wound on the right medial knee and across the top of the patella. This is actually made some progress since then. He was seen in the ED on 8/18 with cellulitis. He was treated with doxycycline. This got extended I think for as long as 30 days. On 9/15 he was seen by his primary felt to have cellulitis with open wounds. He currently has 7 wounds the remnant of the initial contusion injury on the right medial knee which actually I think is actually quite a bit smaller than it was looking at a picture on his wife's cell phone. He also has 6 is wounds on the right lower extremity which includes 2 on the right anterior 3 on the right lateral and one posteriorly. He has compression stockings but has not been wearing them. He says these latter 6 wounds started as blisters that open. He has been using Neosporin. 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 He also has a laceration wound on the  lower medial left thigh just above the knee. We have been using Santyl on the latter, silver alginate on the wounds on the right. The patient does not have an open wound on the left and he has a lot less edema. On the left side. We have given him the number for elastic therapy in [place] for 20/30 stockings on the left leg. I am not certain that that will suffice on the right however 10/13; we received an urgent call from home health this morning reporting that the patient's right leg had a dusky color. We brought him into the clinic for review. He has severe bilateral right greater than left chronic venous insufficiency with secondary lymphedema and has discoloration on the right leg chronically related to this. He also reports he wrapped his upper leg with an Ace wrap and may have done this with excessive tension 10/22. The patient still has a clean wound on the upper anterior thigh the remanent of a very large traumatic wound. Small wound in the right posterior calf in the setting of severe chronic venous hypertension. There is nothing open on the left leg and he has stockings Electronic Signature(s) Signed: 08/08/2019 6:29:04 PM By: Linton Ham MD Entered By: Linton Ham on 08/08/2019 15:09:40 -------------------------------------------------------------------------------- Physical Exam Details Patient Name: Date of Service: [name] 08/08/2019 1:30 PM Medical Record [id number] Patient Account Number: [id number] Date of Birth/Sex: Treating RN: [birth year] (73 y.o. [name] Primary Care Provider: [name] Other Clinician: Referring Provider: Treating Provider/Extender:Robson, [name], [name] in Treatment: 3 Constitutional Sitting or standing Blood Pressure is within target range for patient.. Pulse regular and within target  range for patient.Marland Kitchen Respirations regular, non-labored and within target range.. Temperature is normal and  within the target range for the patient.Marland Kitchen Appears in no distress. Eyes Conjunctivae clear. No discharge.no icterus. Respiratory work of breathing is normal. Cardiovascular Pedal pulses present bilaterally. Good edema control. Integumentary (Hair, Skin) Severe chronic hemosiderin deposition. Psychiatric appears at normal baseline. Notes Wound exam; the area on the right medial knee looks very healthy. Healthy looking granulation tissue we use silver alginate on this. He has a small wound on the right posterior calf which also looks healthy. Nothing open on the left leg severe bilateral venous hypertension. No debridement is required Electronic Signature(s) Signed: 08/08/2019 6:29:04 PM By: Linton Ham MD Entered By: Linton Ham on 08/08/2019 15:12:57 -------------------------------------------------------------------------------- Physician Orders Details Patient Name: Date of Service: [name] 08/08/2019 1:30 PM Medical Record [id number] Patient Account Number: [id number] Date of Birth/Sex: Treating RN: Mar 04, 1946 (73 y.o. 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 - Thursday Nurse Visit: - next week. Dressing Change Frequency Wound #1 Right,Medial  Knee Change Dressing every other day. - home health to change once a week. wife to change all other days. Wound #7 Right,Posterior Lower Leg Other: - home health to change once a week. wound center to change on Thursdays. Skin Barriers/Peri-Wound Care Moisturizing lotion - lotion to both legs. patient to apply lotion nightly to left leg. Wound Cleansing Wound #1 Right,Medial Knee Other: - may wash with soap and water. Wound #7 Right,Posterior Lower Leg May shower with protection. - home health may wash legs with soap and water when dressing changes. Primary Wound Dressing Wound #1 Right,Medial Knee Calcium Alginate with Silver Wound #7 Right,Posterior Lower Leg Calcium Alginate with Silver Secondary Dressing Wound #1 Right,Medial Knee Kerlix/Rolled Gauze - netting to secure dressing. ABD pad Wound #7 Right,Posterior Lower Leg Foam - to blister areas for protection. ABD pad Edema Control 4 layer compression - Right Lower Extremity Avoid standing for long periods of time Elevate legs to the level of the heart or above for 30 minutes daily and/or when sitting, a frequency of: - throughout the day. Exercise regularly Support Garment 20-30 mm/Hg pressure to: - Patient to apply in the morning and remove at night. Please show patient and wife how apply to compression stockings. [place] skilled nursing for wound care. [facility] to change once a week. wound center to change on Thursdays. Electronic Signature(s) Signed: 08/08/2019 6:29:04 PM By: Linton Ham MD Signed: 08/08/2019 6:35:09 PM By: Deon Pilling Entered By: Deon Pilling on 08/08/2019 15:01:28 -------------------------------------------------------------------------------- Problem List Details Patient Name: Date of Service: [name] 08/08/2019 1:30 PM Medical Record [id number] Patient Account Number: [id number] Date of Birth/Sex: Treating RN: [birth year] (73 y.o.  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 07/18/2019 No Yes leg limited to breakdown of skin I87.323 Chronic venous hypertension (idiopathic) with 07/18/2019 No Yes inflammation of bilateral lower extremity E11.622 Type 2 diabetes mellitus with other skin ulcer 07/18/2019 No Yes Inactive Problems Resolved Problems Electronic Signature(s) Signed: 08/08/2019 6:29:04 PM By: Linton Ham MD Entered By: Linton Ham on 08/08/2019 15:08:16 -------------------------------------------------------------------------------- Progress Note Details Patient Name: Date of Service: [name] 08/08/2019 1:30 PM Medical Record [id number] Patient Account Number: [id number] Date of Birth/Sex: Treating RN: 1946/05/10 (73 y.o. [name] Primary Care Provider: [name] Other Clinician: Referring Provider: Treating Provider/Extender:Robson, [name], [name] in Treatment: 3 Subjective History of Present Illness (HPI) ADMISSION 07/18/2019 Patient is a 73 year old man with type 2 diabetes and also chronic edema. He had a fall in July. Fairly substantial wound on the right medial knee and across the top of the patella. This is actually made some progress since then. He was seen in the ED on 8/18 with cellulitis. He was treated with doxycycline. This got extended I think for as long as 30 days. On 9/15 he was seen by his  primary felt to have cellulitis with open wounds. He currently has 7 wounds the remnant of the initial contusion injury on the right medial knee which actually I think is actually quite a bit smaller than it was looking at a picture on his wife's cell phone. He also has 6 is wounds on the right lower extremity which includes 2 on the right anterior 3 on the right lateral and one posteriorly. He has compression stockings but has not been wearing them. He says these latter 6 wounds started as blisters that open. He has been using Neosporin. 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 He also has a laceration wound on the lower medial left thigh just above the knee. We have been using Santyl on the latter, silver alginate on the wounds on the right. The patient does not have an open wound on the left and he has a lot less edema. On the left side. We have given him the number for elastic therapy in [place] for 20/30 stockings on the left leg. I am not certain that that will suffice on the right however 10/13; we received an urgent call from home health this morning reporting that the patient's right leg had a dusky color. We brought him into the clinic for review. He has severe bilateral right greater than left chronic venous insufficiency with secondary lymphedema and has discoloration on the right leg chronically related to this. He also reports he wrapped his upper leg with an Ace wrap and may have done this with excessive tension 10/22. The patient still has a clean wound on the upper anterior  thigh the remanent of a very large traumatic wound. Small wound in the right posterior calf in the setting of severe chronic  venous hypertension. There is nothing open on the left leg and he has stockings Objective Constitutional Sitting or standing Blood Pressure is within target range for patient.. Pulse regular and within target range for patient.Marland Kitchen Respirations regular, non-labored and within target range.. Temperature is normal and within the target range for the patient.Marland Kitchen Appears in no distress. Vitals Time Taken: 2:11 PM, Height: 77 in, Weight: 350 lbs, BMI: 41.5, Temperature: 98.1 F, Pulse: 55 bpm, Respiratory Rate: 18 breaths/min, Blood Pressure: 131/68 mmHg. Eyes Conjunctivae clear. No discharge.no icterus. Respiratory work of breathing is normal. Cardiovascular Pedal pulses present bilaterally. Good edema control. Psychiatric appears at normal baseline. General Notes: Wound exam; the area on the right medial knee looks very healthy. Healthy looking granulation tissue we use silver alginate on this. He has a small wound on the right posterior calf which also looks healthy. Nothing open on the left leg severe bilateral venous hypertension. No debridement is required Integumentary (Hair, Skin) Severe chronic hemosiderin deposition. Wound #1 status is Open. Original cause of wound was Trauma. The wound is located on the Right,Medial Knee. The wound measures 2.5cm length x 3.7cm width x 0.1cm depth; 7.265cm^2 area and 0.726cm^3 volume. There is Fat Layer (Subcutaneous Tissue) Exposed exposed. There is no tunneling or undermining noted. There is a medium amount of serosanguineous drainage noted. The wound margin is flat and intact. There is large (67-100%) red, pink granulation within the wound bed. There is a small (1-33%) amount of necrotic tissue within the wound bed including Adherent Slough. Wound #2 status is Healed - Epithelialized. Original cause of wound was Blister. The  wound is located on the Right,Proximal,Anterior Lower Leg. The wound measures 0cm length x 0cm width x 0cm depth; 0cm^2 area and 0cm^3 volume. There is no tunneling or undermining noted. There is a none present amount of drainage noted. The wound margin is flat and intact. There is no granulation within the wound bed. There is no necrotic tissue within the wound bed. Wound #3 status is Healed - Epithelialized. Original cause of wound was Blister. The wound is located on the Right,Distal,Anterior Lower Leg. The wound measures 0cm length x 0cm width x 0cm depth; 0cm^2 area and 0cm^3 volume. There is no tunneling or undermining noted. There is a none present amount of drainage noted. The wound margin is flat and intact. There is no granulation within the wound bed. There is no necrotic tissue within the wound bed. Wound #6 status is Healed - Epithelialized. Original cause of wound was Blister. The wound is located on the Right,Distal Lower Leg. The wound measures 0cm length x 0cm width x 0cm depth; 0cm^2 area and 0cm^3 volume. There is no tunneling or undermining noted. There is a none present amount of drainage noted. The wound margin is flat and intact. There is no granulation within the wound bed. There is no necrotic tissue within the wound bed. Wound #7 status is Open. Original cause of wound was Blister. The wound is located on the Right,Posterior Lower Leg. The wound measures 0.6cm length x 0.4cm width x 0.1cm depth; 0.188cm^2 area and 0.019cm^3 volume. There is Fat Layer (Subcutaneous Tissue) Exposed exposed. There is no tunneling or undermining noted. There is a small amount of serosanguineous drainage noted. The wound margin is flat and intact. There is large (67-100%) red granulation within the wound bed. There is no necrotic tissue within the wound bed. 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 There was a Four Layer Compression Therapy Procedure with a pre-treatment ABI of 1.1 by Kela Millin, RN. Post procedure Diagnosis Wound #7: Same as Pre-Procedure Plan Follow-up Appointments: Return Appointment in 2 weeks. - Thursday Nurse Visit: - next week. Dressing Change Frequency: Wound #1 Right,Medial Knee: Change Dressing every other day. - home health to change once a week. wife to change all other days. Wound #7 Right,Posterior Lower Leg: Other: - home health to change once a week. wound center to change on Thursdays. Skin Barriers/Peri-Wound Care: Moisturizing lotion - lotion to both legs. patient to apply lotion nightly to left leg. Wound Cleansing: Wound #1 Right,Medial Knee: Other: - may wash with soap and water. Wound #7 Right,Posterior Lower Leg: May shower with protection. - home health may wash legs with soap and water when dressing changes. Primary Wound Dressing: Wound #1 Right,Medial Knee: Calcium Alginate with Silver Wound #7 Right,Posterior Lower Leg: Calcium Alginate with Silver Secondary Dressing: Wound #1 Right,Medial Knee: Kerlix/Rolled Gauze - netting to secure dressing. ABD pad Wound #7 Right,Posterior Lower Leg: Foam - to blister areas for protection. ABD pad Edema Control: 4 layer compression - Right Lower Extremity Avoid standing for long periods of time Elevate legs to the level of the heart or above for 30 minutes daily and/or when sitting, a frequency of: - throughout the day. Exercise regularly Support  Garment 20-30 mm/Hg pressure to: - Patient to apply in the morning and remove at night. Please show patient and wife how apply to compression stockings. Home Health: [place] skilled nursing for wound care. [facility] to change once a week. wound center to change on Thursdays. 1. Continue with silver alginate to both wound areas 2. Continue with compression on the right leg 3. New stocking on the left Electronic Signature(s) Signed: 08/08/2019 6:29:04 PM By: Linton Ham MD Entered By: Linton Ham on 08/08/2019 15:13:38 -------------------------------------------------------------------------------- SuperBill Details Patient Name: Date of Service: [name] 08/08/2019 Medical Record [id number] Patient Account Number: [id number] Date of Birth/Sex: Treating RN: [birth year] (73 y.o. [name], Meta.Reding Primary Care Provider: [name] Other Clinician: Referring Provider: Treating Provider/Extender:Robson, [name], [name] in Treatment: 3 Diagnosis Coding ICD-10 Codes Code Description I89.0 Lymphedema, not elsewhere classified S81.011D Laceration without foreign body, right knee, subsequent encounter L97.812 Non-pressure chronic ulcer of other part of right lower leg with fat layer exposed L97.811 Non-pressure chronic ulcer of other part of right lower leg limited to breakdown of skin I87.323 Chronic venous hypertension (idiopathic) with inflammation of bilateral lower extremity E11.622 Type 2 diabetes mellitus with other skin ulcer Facility Procedures The patient participates with Medicare or their insurance follows the Medicare Facility Guidelines: CPT4 Code Description Modifier Quantity [phone] (Facility Use Only) [phone] - [place] RT LEG 1 Physician Procedures CPT4 Code Description: [id number] - WC PHYS LEVEL 3 - EST PT ICD-10 Diagnosis Description L97.812 Non-pressure chronic ulcer of other part of  right lower leg L97.811 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

## 2019-08-13 DIAGNOSIS — L97211 Non-pressure chronic ulcer of right calf limited to breakdown of skin: Secondary | ICD-10-CM | POA: Diagnosis not present

## 2019-08-13 DIAGNOSIS — I87331 Chronic venous hypertension (idiopathic) with ulcer and inflammation of right lower extremity: Secondary | ICD-10-CM | POA: Diagnosis not present

## 2019-08-13 DIAGNOSIS — I87322 Chronic venous hypertension (idiopathic) with inflammation of left lower extremity: Secondary | ICD-10-CM | POA: Diagnosis not present

## 2019-08-13 DIAGNOSIS — L03115 Cellulitis of right lower limb: Secondary | ICD-10-CM | POA: Diagnosis not present

## 2019-08-13 DIAGNOSIS — S81011D Laceration without foreign body, right knee, subsequent encounter: Secondary | ICD-10-CM | POA: Diagnosis not present

## 2019-08-13 DIAGNOSIS — L97811 Non-pressure chronic ulcer of other part of right lower leg limited to breakdown of skin: Secondary | ICD-10-CM | POA: Diagnosis not present

## 2019-08-15 ENCOUNTER — Other Ambulatory Visit: Payer: Self-pay

## 2019-08-15 ENCOUNTER — Encounter (HOSPITAL_BASED_OUTPATIENT_CLINIC_OR_DEPARTMENT_OTHER): Payer: Medicare Other | Admitting: Internal Medicine

## 2019-08-15 DIAGNOSIS — L97212 Non-pressure chronic ulcer of right calf with fat layer exposed: Secondary | ICD-10-CM | POA: Diagnosis not present

## 2019-08-15 DIAGNOSIS — L97812 Non-pressure chronic ulcer of other part of right lower leg with fat layer exposed: Secondary | ICD-10-CM | POA: Diagnosis not present

## 2019-08-15 DIAGNOSIS — I890 Lymphedema, not elsewhere classified: Secondary | ICD-10-CM | POA: Diagnosis not present

## 2019-08-15 DIAGNOSIS — I87323 Chronic venous hypertension (idiopathic) with inflammation of bilateral lower extremity: Secondary | ICD-10-CM | POA: Diagnosis not present

## 2019-08-15 DIAGNOSIS — E11622 Type 2 diabetes mellitus with other skin ulcer: Secondary | ICD-10-CM | POA: Diagnosis not present

## 2019-08-15 DIAGNOSIS — S81011A Laceration without foreign body, right knee, initial encounter: Secondary | ICD-10-CM | POA: Diagnosis not present

## 2019-08-19 DIAGNOSIS — L97211 Non-pressure chronic ulcer of right calf limited to breakdown of skin: Secondary | ICD-10-CM | POA: Diagnosis not present

## 2019-08-19 DIAGNOSIS — L97811 Non-pressure chronic ulcer of other part of right lower leg limited to breakdown of skin: Secondary | ICD-10-CM | POA: Diagnosis not present

## 2019-08-19 DIAGNOSIS — I87322 Chronic venous hypertension (idiopathic) with inflammation of left lower extremity: Secondary | ICD-10-CM | POA: Diagnosis not present

## 2019-08-19 DIAGNOSIS — S81011D Laceration without foreign body, right knee, subsequent encounter: Secondary | ICD-10-CM | POA: Diagnosis not present

## 2019-08-19 DIAGNOSIS — L03115 Cellulitis of right lower limb: Secondary | ICD-10-CM | POA: Diagnosis not present

## 2019-08-19 DIAGNOSIS — I87331 Chronic venous hypertension (idiopathic) with ulcer and inflammation of right lower extremity: Secondary | ICD-10-CM | POA: Diagnosis not present

## 2019-08-19 NOTE — Progress Notes (Signed)
YOUCEF, KLAS ([id number]) Visit Report for 08/15/2019 Arrival Information Details Patient Name: Date of Service: [name], [name] 08/15/2019 12:30 PM Medical Record [id number] Patient Account Number: [id number] Date of Birth/Sex: Treating RN: [birth year] (73 y.o. Janyth Contes Primary Care Kamora Vossler: [name] Other Clinician: Referring Shamal Stracener: Treating Laterria Lasota/Extender:Robson, [name], [name] in Treatment: 4 Visit Information History Since Last Visit Added or deleted any medications: No Patient Arrived: [name] Any new allergies or adverse reactions: No Arrival Time: 12:29 Had a fall or experienced change in No Accompanied By: alone activities of daily living that may affect Transfer Assistance: None risk of falls: Patient Identification Verified: Yes Signs or symptoms of abuse/neglect since last No Secondary Verification Process Yes visito Completed: Hospitalized since last visit: No Patient Requires Transmission- No Implantable device outside of the clinic excluding No Based Precautions: cellular tissue based products placed in the center Patient Has Alerts: Yes since last visit: Patient Alerts: Patient on Blood Has Dressing in Place as Prescribed: Yes Thinner Has Compression in Place as Prescribed: Yes Pain Present Now: No Electronic Signature(s) Signed: 08/19/2019 6:00:40 PM By: Levan Hurst RN, BSN Entered By: Levan Hurst on 08/15/2019 12:30:15 -------------------------------------------------------------------------------- Compression Therapy Details Patient Name: Date of Service: [name] 08/15/2019 12:30 PM Medical Record [id number] Patient Account Number: [id number] Date of Birth/Sex: Treating RN: July 31, 1946 (73 y.o. Janyth Contes Primary Care Sahra Converse: [name] Other Clinician: Referring Shayle Donahoo: Treating Aerik Polan/Extender:Robson, [name], [name] in Treatment:  4 Compression Therapy Performed for Wound Wound #7 Right,Posterior Lower Leg Assessment: Performed By: Clinician Levan Hurst, RN Compression Type: Four Layer Electronic Signature(s) Signed: 08/19/2019 6:00:40 PM By: Levan Hurst RN, BSN Entered By: Levan Hurst on 08/15/2019 12:50:28 -------------------------------------------------------------------------------- Encounter Discharge Information Details Patient Name: Date of Service: [name] 08/15/2019 12:30 PM Medical Record [id number] Patient Account Number: [id number] Date of Birth/Sex: Treating RN: 08/05/1946 (73 y.o. Janyth Contes Primary Care Eriyah Fernando: [name] Other Clinician: Referring Layliana Devins: Treating Ezekeil Bethel/Extender:Robson, [name], [name] in Treatment: 4 Encounter Discharge Information Items Discharge Condition: Stable Ambulatory Status: Walker Discharge Destination: Home Transportation: Private Auto Accompanied By: wife Schedule Follow-up Appointment: Yes Clinical Summary of Care: Patient Declined Electronic Signature(s) Signed: 08/19/2019 6:00:40 PM By: Levan Hurst RN, BSN Entered By: Levan Hurst on 08/15/2019 12:52:04 -------------------------------------------------------------------------------- Patient/Caregiver Education Details Arlee Muslim, [name] 10/29/2020andnbsp12:30 Patient Name: Date of Service: C. PM Medical Record Patient Account Number: [id number] Number: Treating RN: Levan Hurst Date of Birth/Gender: 07-Sep-1946 (73 y.o. Other Clinician: M) Treating Linton Ham Primary Care Physician/Extender: [name] Physician: Suella Grove in Treatment: 4 Referring Physician: [name] Education Assessment Education Provided To: Patient Education Topics Provided Wound/Skin Impairment: Methods: Explain/Verbal Responses: State content correctly Electronic Signature(s) Signed: 08/19/2019 6:00:40 PM By: Levan Hurst RN,  BSN Entered By: Levan Hurst on 08/15/2019 12:51:51 -------------------------------------------------------------------------------- Wound Assessment Details Patient Name: Date of Service: [name] 08/15/2019 12:30 PM Medical Record [id number] Patient Account Number: [id number] Date of Birth/Sex: Treating RN: 1946-04-25 (73 y.o. Janyth Contes Primary Care Royal Beirne: [name] Other Clinician: Referring Judea Riches: Treating Ahniyah Giancola/Extender:Robson, [name], [name] in Treatment: 4 Wound Status Wound Number: 1 Primary Trauma, Other Etiology: Wound Location: Right Knee - Medial Wound Open Wounding Event: Trauma Status: Date Acquired: 05/01/2019 Comorbid Sleep Apnea, Congestive Heart Failure, Weeks Of Treatment: 4 History: Hypertension, Type II Diabetes, Neuropathy, Clustered Wound: No Received Chemotherapy Wound Measurements Length: (cm) 2.5 % Reduc Width: (cm) 3.7 % Reduc Depth: (cm) 0.1  Epithel Area: (cm) 7.265 Tunnel Volume: (cm) 0.726 Underm Wound Description Classification: Full Thickness Without Exposed Support Structures Wound Flat and Intact Margin: Exudate Medium Amount: Exudate Serosanguineous Type: Exudate red, brown Color: Wound Bed Granulation Amount: Large (67-100%) Granulation Quality: Pink Necrotic Amount: None Present (0%) Foul Odor After Cleansing: No Slough/Fibrino Yes Exposed Structure Fascia Exposed: No Fat Layer (Subcutaneous Tissue) Exposed: Yes Tendon Exposed: No Muscle Exposed: No Joint Exposed: No Bone Exposed: No tion in Area: 75.7% tion in Volume: 75.7% ialization: Medium (34-66%) ing: No ining: No Treatment Notes Wound #1 (Right, Medial Knee) 1. Cleanse With Wound Cleanser 3. Primary Dressing Applied Calcium Alginate Ag 4. Secondary Dressing Dry Gauze Roll Gauze 5. Secured With Recruitment consultant) Signed: 08/19/2019 6:00:40 PM By: Levan Hurst RN, BSN Entered By:  Levan Hurst on 08/15/2019 12:49:54 -------------------------------------------------------------------------------- Wound Assessment Details Patient Name: Date of Service: [name], [name] 08/15/2019 12:30 PM Medical Record [id number] Patient Account Number: [id number] Date of Birth/Sex: Treating RN: September 30, 1946 (73 y.o. Janyth Contes Primary Care Demonte Dobratz: [name] Other Clinician: Referring Said Rueb: Treating Carrigan Delafuente/Extender:Robson, [name], [name] in Treatment: 4 Wound Status Wound Number: 7 Primary Venous Leg Ulcer Etiology: Wound Location: Right Lower Leg - Posterior Wound Open Wounding Event: Blister Status: Date Acquired: 05/01/2019 Comorbid Sleep Apnea, Congestive Heart Failure, Weeks Of Treatment: 4 History: Hypertension, Type II Diabetes, Neuropathy, Clustered Wound: No Received Chemotherapy Wound Measurements Length: (cm) 0.6 % Reduc Width: (cm) 0.4 % Reduc Depth: (cm) 0.1 Epithel Area: (cm) 0.188 Tunnel Volume: (cm) 0.019 Underm Wound Description Classification: Full Thickness Without Exposed Support Foul O Structures Slough Wound Flat and Intact Margin: Exudate Small Amount: Exudate Serosanguineous Type: Exudate red, brown Color: Wound Bed Granulation Amount: Large (67-100%) Granulation Quality: Red Fascia Expos Necrotic Amount: None Present (0%) Fat Layer (S Tendon Expos Muscle Expos Joint Expose Bone Exposed dor After Cleansing: No /Fibrino Yes Exposed Structure ed: No ubcutaneous Tissue) Exposed: Yes ed: No ed: No d: No : No tion in Area: 99.2% tion in Volume: 99.2% ialization: Large (67-100%) ing: No ining: No Treatment Notes Wound #7 (Right, Posterior Lower Leg) 1. Cleanse With Soap and water 2. Periwound Care Moisturizing lotion 3. Primary Dressing Applied Calcium Alginate Ag 4. Secondary Dressing ABD Pad Foam 6. Support Layer Applied 4 layer compression Orthoptist) Signed: 08/19/2019 6:00:40 PM By: Levan Hurst RN, BSN Entered By: Levan Hurst on 08/15/2019 12:50:03 -------------------------------------------------------------------------------- Vitals Details Patient Name: Date of Service: [name] 08/15/2019 12:30 PM Medical Record [id number] Patient Account Number: [id number] Date of Birth/Sex: Treating RN: Sep 25, 1946 (73 y.o. Janyth Contes Primary Care Benetta Maclaren: [name] Other Clinician: Referring Linea Calles: Treating Briceida Rasberry/Extender:Robson, [name], [name] in Treatment: 4 Vital Signs Time Taken: 12:30 Temperature (F): 98.4 Height (in): 77 Pulse (bpm): 58 Weight (lbs): 350 Respiratory Rate (breaths/min): 18 Body Mass Index (BMI): 41.5 Blood Pressure (mmHg): 123/57 Reference Range: 80 - 120 mg / dl Electronic Signature(s) Signed: 08/19/2019 6:00:40 PM By: Levan Hurst RN, BSN Entered By: Levan Hurst on 08/15/2019 12:32:05

## 2019-08-19 NOTE — Progress Notes (Signed)
KAMIN, NIBLACK ([id number]) Visit Report for 08/15/2019 SuperBill Details Patient Name: Date of Service: [name], [name] 08/15/2019 Medical Record [id number] Patient Account Number: [id number] Date of Birth/Sex: Treating RN: Dec 31, 1945 (73 y.o. 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

## 2019-08-21 DIAGNOSIS — L97211 Non-pressure chronic ulcer of right calf limited to breakdown of skin: Secondary | ICD-10-CM | POA: Diagnosis not present

## 2019-08-21 DIAGNOSIS — I5022 Chronic systolic (congestive) heart failure: Secondary | ICD-10-CM | POA: Diagnosis not present

## 2019-08-21 DIAGNOSIS — I444 Left anterior fascicular block: Secondary | ICD-10-CM | POA: Diagnosis not present

## 2019-08-21 DIAGNOSIS — I429 Cardiomyopathy, unspecified: Secondary | ICD-10-CM | POA: Diagnosis not present

## 2019-08-21 DIAGNOSIS — G319 Degenerative disease of nervous system, unspecified: Secondary | ICD-10-CM | POA: Diagnosis not present

## 2019-08-21 DIAGNOSIS — I482 Chronic atrial fibrillation, unspecified: Secondary | ICD-10-CM | POA: Diagnosis not present

## 2019-08-21 DIAGNOSIS — L03115 Cellulitis of right lower limb: Secondary | ICD-10-CM | POA: Diagnosis not present

## 2019-08-21 DIAGNOSIS — E785 Hyperlipidemia, unspecified: Secondary | ICD-10-CM | POA: Diagnosis not present

## 2019-08-21 DIAGNOSIS — F329 Major depressive disorder, single episode, unspecified: Secondary | ICD-10-CM | POA: Diagnosis not present

## 2019-08-21 DIAGNOSIS — M25561 Pain in right knee: Secondary | ICD-10-CM | POA: Diagnosis not present

## 2019-08-21 DIAGNOSIS — L97811 Non-pressure chronic ulcer of other part of right lower leg limited to breakdown of skin: Secondary | ICD-10-CM | POA: Diagnosis not present

## 2019-08-21 DIAGNOSIS — E1140 Type 2 diabetes mellitus with diabetic neuropathy, unspecified: Secondary | ICD-10-CM | POA: Diagnosis not present

## 2019-08-21 DIAGNOSIS — I890 Lymphedema, not elsewhere classified: Secondary | ICD-10-CM | POA: Diagnosis not present

## 2019-08-21 DIAGNOSIS — I87322 Chronic venous hypertension (idiopathic) with inflammation of left lower extremity: Secondary | ICD-10-CM | POA: Diagnosis not present

## 2019-08-21 DIAGNOSIS — G9341 Metabolic encephalopathy: Secondary | ICD-10-CM | POA: Diagnosis not present

## 2019-08-21 DIAGNOSIS — Z7901 Long term (current) use of anticoagulants: Secondary | ICD-10-CM | POA: Diagnosis not present

## 2019-08-21 DIAGNOSIS — I87331 Chronic venous hypertension (idiopathic) with ulcer and inflammation of right lower extremity: Secondary | ICD-10-CM | POA: Diagnosis not present

## 2019-08-21 DIAGNOSIS — S81011D Laceration without foreign body, right knee, subsequent encounter: Secondary | ICD-10-CM | POA: Diagnosis not present

## 2019-08-21 DIAGNOSIS — Z6841 Body Mass Index (BMI) 40.0 and over, adult: Secondary | ICD-10-CM | POA: Diagnosis not present

## 2019-08-21 DIAGNOSIS — G4733 Obstructive sleep apnea (adult) (pediatric): Secondary | ICD-10-CM | POA: Diagnosis not present

## 2019-08-21 DIAGNOSIS — R32 Unspecified urinary incontinence: Secondary | ICD-10-CM | POA: Diagnosis not present

## 2019-08-21 DIAGNOSIS — I5030 Unspecified diastolic (congestive) heart failure: Secondary | ICD-10-CM | POA: Diagnosis not present

## 2019-08-21 DIAGNOSIS — R4182 Altered mental status, unspecified: Secondary | ICD-10-CM | POA: Diagnosis not present

## 2019-08-21 DIAGNOSIS — I110 Hypertensive heart disease with heart failure: Secondary | ICD-10-CM | POA: Diagnosis not present

## 2019-08-22 ENCOUNTER — Encounter (HOSPITAL_BASED_OUTPATIENT_CLINIC_OR_DEPARTMENT_OTHER): Payer: Medicare Other | Admitting: Internal Medicine

## 2019-08-23 ENCOUNTER — Encounter (HOSPITAL_BASED_OUTPATIENT_CLINIC_OR_DEPARTMENT_OTHER): Payer: Medicare Other | Admitting: Internal Medicine

## 2019-08-26 DIAGNOSIS — L97811 Non-pressure chronic ulcer of other part of right lower leg limited to breakdown of skin: Secondary | ICD-10-CM | POA: Diagnosis not present

## 2019-08-26 DIAGNOSIS — L97211 Non-pressure chronic ulcer of right calf limited to breakdown of skin: Secondary | ICD-10-CM | POA: Diagnosis not present

## 2019-08-26 DIAGNOSIS — I87322 Chronic venous hypertension (idiopathic) with inflammation of left lower extremity: Secondary | ICD-10-CM | POA: Diagnosis not present

## 2019-08-26 DIAGNOSIS — L03115 Cellulitis of right lower limb: Secondary | ICD-10-CM | POA: Diagnosis not present

## 2019-08-26 DIAGNOSIS — I87331 Chronic venous hypertension (idiopathic) with ulcer and inflammation of right lower extremity: Secondary | ICD-10-CM | POA: Diagnosis not present

## 2019-08-26 DIAGNOSIS — S81011D Laceration without foreign body, right knee, subsequent encounter: Secondary | ICD-10-CM | POA: Diagnosis not present

## 2019-08-27 ENCOUNTER — Encounter (HOSPITAL_BASED_OUTPATIENT_CLINIC_OR_DEPARTMENT_OTHER): Payer: Medicare Other | Admitting: Internal Medicine

## 2019-08-29 ENCOUNTER — Encounter (HOSPITAL_BASED_OUTPATIENT_CLINIC_OR_DEPARTMENT_OTHER): Payer: Medicare Other | Attending: Internal Medicine | Admitting: Internal Medicine

## 2019-08-29 ENCOUNTER — Other Ambulatory Visit: Payer: Self-pay

## 2019-08-29 DIAGNOSIS — I890 Lymphedema, not elsewhere classified: Secondary | ICD-10-CM | POA: Diagnosis not present

## 2019-08-29 DIAGNOSIS — I482 Chronic atrial fibrillation, unspecified: Secondary | ICD-10-CM | POA: Diagnosis not present

## 2019-08-29 DIAGNOSIS — G4730 Sleep apnea, unspecified: Secondary | ICD-10-CM | POA: Diagnosis not present

## 2019-08-29 DIAGNOSIS — I429 Cardiomyopathy, unspecified: Secondary | ICD-10-CM | POA: Insufficient documentation

## 2019-08-29 DIAGNOSIS — E11622 Type 2 diabetes mellitus with other skin ulcer: Secondary | ICD-10-CM | POA: Diagnosis not present

## 2019-08-29 DIAGNOSIS — E1151 Type 2 diabetes mellitus with diabetic peripheral angiopathy without gangrene: Secondary | ICD-10-CM | POA: Diagnosis not present

## 2019-08-29 DIAGNOSIS — I5042 Chronic combined systolic (congestive) and diastolic (congestive) heart failure: Secondary | ICD-10-CM | POA: Diagnosis not present

## 2019-08-29 DIAGNOSIS — S81001A Unspecified open wound, right knee, initial encounter: Secondary | ICD-10-CM | POA: Diagnosis not present

## 2019-08-29 DIAGNOSIS — L97812 Non-pressure chronic ulcer of other part of right lower leg with fat layer exposed: Secondary | ICD-10-CM | POA: Diagnosis not present

## 2019-08-29 NOTE — Progress Notes (Signed)
[name], [name] ([id number]) Visit Report for 08/29/2019 Debridement Details Patient Name: [name], [name]. Date of Service: 08/29/2019 1:30 PM Medical Record [id number] Patient Account Number: [id number] Date of Birth/Sex: 1946/07/18 (73 y.o. M) Treating RN: [name] Primary Care Provider: Laurey [name] Other Clinician: Referring Provider: Treating Provider/Extender:[name], [name], [name] in Treatment: 6 Debridement Performed for Wound #1 Right,Medial Knee Assessment: Performed By: Physician [name]., MD Debridement Type: Debridement Level of Consciousness (Pre- Awake and Alert procedure): Pre-procedure Yes - 14:24 Verification/Time Out Taken: Start Time: 14:25 Pain Control: Lidocaine 5% topical ointment Total Area Debrided (L x W): 0.5 (cm) x 0.5 (cm) = 0.25 (cm) Tissue and other material Viable, Skin: Dermis , Skin: Epidermis debrided: Level: Skin/Epidermis Debridement Description: Selective/Open Wound Instrument: Curette Bleeding: Moderate Hemostasis Achieved: Pressure End Time: 14:26 Procedural Pain: 0 Post Procedural Pain: 1 Response to Treatment: Procedure was tolerated well Level of Consciousness Awake and Alert (Post-procedure): Post Debridement Measurements of Total Wound Length: (cm) 1.5 Width: (cm) 2 Depth: (cm) 0.1 Volume: (cm) 0.236 Character of Wound/Ulcer Post Improved Debridement: Post Procedure Diagnosis Same as Pre-procedure Electronic Signature(s) Signed: 08/29/2019 5:17:30 PM By: [name] Signed: 08/29/2019 5:47:05 PM By: [name] Ham MD Entered By: [name] on 08/29/2019 15:19:02 -------------------------------------------------------------------------------- HPI Details Patient Name: Date of Service: [name] 08/29/2019 1:30 PM Medical Record [id number] Patient Account Number: [id number] Date of Birth/Sex: Treating RN: 16-Oct-1946 (73 y.o. [name] Primary  Care Provider: Laurey [name] Other Clinician: Referring Provider: Treating Provider/Extender:[name], [name], [name] in Treatment: 6 History of Present Illness HPI Description: ADMISSION 07/18/2019 Patient is a 73 year old man with type 2 diabetes and also chronic edema. He had a fall in July. Fairly substantial wound on the right medial knee and across the top of the patella. This is actually made some progress since then. He was seen in the ED on 8/18 with cellulitis. He was treated with doxycycline. This got extended I think for as long as 30 days. On 9/15 he was seen by his primary felt to have cellulitis with open wounds. He currently has 7 wounds the remnant of the initial contusion injury on the right medial knee which actually I think is actually quite a bit smaller than it was looking at a picture on his wife's cell phone. He also has 6 is wounds on the right lower extremity which includes 2 on the right anterior 3 on the right lateral and one posteriorly. He has compression stockings but has not been wearing them. He says these latter 6 wounds started as blisters that open. He has been using Neosporin. 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 He also has a laceration wound on the lower medial left thigh just above the knee. We have been using Santyl on the latter, silver alginate on the wounds on the right. The patient does not have an open wound on the left and he has a lot less edema. On the left side. We have given him the number for elastic therapy in [place] for  20/30 stockings on the left leg. I am not certain that that will suffice on the  right however 10/13; we received an urgent call from home health this morning reporting that the patient's right leg had a dusky color. We brought him into the clinic for review. He has severe bilateral right greater than left chronic venous insufficiency with secondary lymphedema and has discoloration on the right leg chronically related to this. He also reports he wrapped his upper leg with an Ace wrap and may have [name] this with excessive tension 10/22. The patient still has a clean wound on the upper anterior thigh the remanent of a very large traumatic wound. Small wound in the right posterior calf in the setting of severe chronic venous hypertension. There is nothing open on the left leg and he has stockings 11/12; I think since the last time the patient was here the area on the traumatic wound just above his knee scabbed over and was felt to be healed. During the course of wound preparation today our intake nurse washed the scab off there is still an open wound here. Also still a small area on the posterior calf on the right in the setting of severe chronic venous hypertension. He has not been dressing the right thigh wound. Using silver alginate on the other wounds Electronic Signature(s) Signed: 08/29/2019 5:47:05 PM By: [name] Ham MD Entered By: [name] on 08/29/2019 15:21:10 -------------------------------------------------------------------------------- Physical Exam Details Patient Name: Date of Service: [name] 08/29/2019 1:30 PM Medical Record [id number] Patient Account Number: [id number] Date of Birth/Sex: Treating RN: 1946-02-04 (73 y.o. [name] Primary Care Provider: Laurey [name] Other Clinician: Referring Provider: Treating Provider/Extender:[name], [name], [name] in Treatment: 6 Constitutional Sitting or standing Blood Pressure is  within target range for patient.. Pulse regular and within target range for patient.Marland Kitchen Respirations regular, non-labored and within target range.. Temperature is normal and within the target range for the patient.Marland Kitchen Appears in no distress. Eyes Conjunctivae clear. No discharge.no icterus. Respiratory work of breathing is normal. Cardiovascular Dorsalis pedis pulse on the right is palpable. Psychiatric appears at normal baseline. Notes Wound exam; the area on the right thigh just above the knee looks healthy. This apparently had an active scab on it on arrival that washed off. He is still open here and the wound will need to be dressed. He has a very small area on the right posterior calf. No debridement is required Electronic Signature(s) Signed: 08/29/2019 5:47:05 PM By: [name] Ham MD Entered By: [name] on 08/29/2019 15:23:41 -------------------------------------------------------------------------------- Physician Orders Details Patient Name: Date of Service: [name] 08/29/2019 1:30 PM Medical Record [id number] Patient Account Number: [id number] Date of Birth/Sex: Treating RN: 19-May-1946 (73 y.o. [name], Meta.Reding Primary Care Provider: Laurey [name] Other Clinician: Referring Provider: Treating Provider/Extender:Danne Scardina, [name], [name] in Treatment: 6 Verbal / Phone Orders: No Diagnosis Coding ICD-10 Coding Code Description I89.0 Lymphedema, not elsewhere classified S81.011D Laceration without foreign body, right knee, subsequent encounter L97.812 Non-pressure chronic ulcer of other part of right lower leg with fat layer exposed L97.811 Non-pressure chronic ulcer of other part of right lower leg limited to breakdown of skin I87.323 Chronic venous hypertension (idiopathic) with inflammation of bilateral lower extremity E11.622 Type 2 diabetes mellitus with other skin ulcer Follow-up Appointments Return appointment in 3 weeks. -  week after Thanksgiving. Thursday. Dressing Change Frequency Wound #1 Right,Medial Knee Change Dressing every other day. - home health to change once a week. wife to change all other days. Wound #7 Right,Posterior Lower Leg Other: - home health to  change once a week. wound center to change on Thursdays. Skin Barriers/Peri-Wound Care Moisturizing lotion - lotion to both legs. patient to apply lotion nightly to left leg. Wound Cleansing Wound #1 Right,Medial Knee Other: - may wash with soap and water. Wound #7 Right,Posterior Lower Leg May shower with protection. - home health may wash legs with soap and water when dressing changes. Primary Wound Dressing Wound #1 Right,Medial Knee Silver Collagen - moisten with saline or [place] Jelly Wound #7 Right,Posterior Lower Leg Silver Collagen - moisten with saline or [place] Jelly Secondary Dressing Wound #1 Right,Medial Knee ABD pad - or self-adhesive foam border. Wound #7 Right,Posterior Lower Leg Dry Gauze Edema Control 4 layer compression - Right Lower Extremity Avoid standing for long periods of time Elevate legs to the level of the heart or above for 30 minutes daily and/or when sitting, a frequency of: - throughout the day. Exercise regularly Support Garment 20-30 mm/Hg pressure to: - Patient to apply in the morning and remove at night. Please show patient and wife how apply to compression stockings. [facility] for wound care. [facility] to change once a week. wound center to change on Thursdays. Electronic Signature(s) Signed: 08/29/2019 5:17:30 PM By: [name] Signed: 08/29/2019 5:47:05 PM By: [name] Ham MD Entered By: [name] on 08/29/2019 14:33:48 -------------------------------------------------------------------------------- Problem List Details Patient Name: Date of Service: [name] 08/29/2019 1:30 PM Medical Record [id number] Patient Account  Number: [id number] Date of Birth/Sex: Treating RN: [birth year] (73 y.o. [name], Meta.Reding Primary Care Provider: Laurey [name] Other Clinician: Referring Provider: Treating Provider/Extender:Plummer Matich, [name], [name] in Treatment: 6 Active Problems ICD-10 Evaluated Encounter Code Description Active Date Today Diagnosis I89.0 Lymphedema, not elsewhere classified 07/18/2019 No Yes S81.011D Laceration without foreign body, right knee, 07/18/2019 No Yes subsequent encounter L97.812 Non-pressure chronic ulcer of other part of right lower 07/18/2019 No Yes leg with fat layer exposed L97.811 Non-pressure chronic ulcer of other part of right lower 07/18/2019 No Yes leg limited to breakdown of skin I87.323 Chronic venous hypertension (idiopathic) with 07/18/2019 No Yes inflammation of bilateral lower extremity E11.622 Type 2 diabetes mellitus with other skin ulcer 07/18/2019 No Yes Inactive Problems Resolved Problems Electronic Signature(s) Signed: 08/29/2019 5:47:05 PM By: [name] Ham MD Entered By: [name] on 08/29/2019 15:17:32 -------------------------------------------------------------------------------- Progress Note Details Patient Name: Date of Service: [name] 08/29/2019 1:30 PM Medical Record [id number] Patient Account Number: [id number] Date of Birth/Sex: Treating RN: 09/14/1946 (72 y.o. [name] Primary Care Provider: Laurey [name] Other Clinician: Referring Provider: Treating Provider/Extender:Nastassja Witkop, [name], [name] in Treatment: 6 Subjective History of Present Illness (HPI) ADMISSION 07/18/2019 Patient is a 73 year old man with type 2 diabetes and also chronic edema. He had a fall in July. Fairly substantial wound on the right medial knee and across the top of the patella. This is actually made some progress since then. He was seen in the ED on 8/18 with cellulitis. He was treated with doxycycline. This got  extended I think for as long as 30 days. On 9/15 he was seen by his primary felt to have cellulitis with open wounds. He currently has 7 wounds the remnant of the initial contusion injury on the right medial knee which actually I think is actually quite a bit smaller than it was looking at a picture on his wife's cell phone. He also has 6 is wounds on the right lower extremity which includes 2 on  the right anterior 3 on the right lateral and one posteriorly. He has compression stockings but has not been wearing them. He says these latter 6 wounds started as blisters that open. He has been using Neosporin. 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 He also has a laceration wound on the lower medial left thigh just above the knee. We have been using Santyl on the latter, silver alginate on the wounds on the right. The patient does not have an open wound on the left and he has a lot less edema. On the left side. We have given him the number for elastic therapy in [place] for 20/30 stockings on the left leg. I am not certain that that will suffice on the right however 10/13; we received an urgent call from home health this morning reporting that the patient's right leg had a dusky color. We brought him into the clinic for review. He has severe bilateral right greater than left chronic venous insufficiency with secondary lymphedema and has discoloration on the right leg chronically related to this. He also reports he wrapped his upper leg with an Ace wrap and may have [name] this with excessive  tension 10/22. The patient still has a clean wound on the upper anterior thigh the remanent of a very large traumatic wound. Small wound in the right posterior calf in the setting of severe chronic venous hypertension. There is nothing open on the left leg and he has stockings 11/12; I think since the last time the patient was here the area on the traumatic wound just above his knee scabbed over and was felt to be healed. During the course of wound preparation today our intake nurse washed the scab off there is still an open wound here. Also still a small area on the posterior calf on the right in the setting of severe chronic venous hypertension. He has not been dressing the right thigh wound. Using silver alginate on the other wounds Objective Constitutional Sitting or standing Blood Pressure is within target range for patient.. Pulse regular and within target range for patient.Marland Kitchen Respirations regular, non-labored and within target range.. Temperature is normal and within the target range for the patient.Marland Kitchen Appears in no distress. Vitals Time Taken: 1:48 PM, Height: 77 in, Weight: 350 lbs, BMI: 41.5, Temperature: 98.9 F, Pulse: 58 bpm, Respiratory Rate: 18 breaths/min, Blood Pressure: 112/80 mmHg. Eyes Conjunctivae clear. No discharge.no icterus. Respiratory work of breathing is normal. Cardiovascular Dorsalis pedis pulse on the right is palpable. Psychiatric appears at normal baseline. General Notes: Wound exam; the area on the right thigh just above the knee looks healthy. This apparently had an active scab on it on arrival that washed off. He is still open here and the wound will need to be dressed. He has a very small area on the right posterior calf. No debridement is required Integumentary (Hair, Skin) Wound #1 status is Open. Original cause of wound was Trauma. The wound is located on the Right,Medial Knee. The wound measures 0.9cm length x 1.5cm width x 0.1cm depth; 1.06cm^2  area and 0.106cm^3 volume. There is Fat Layer (Subcutaneous Tissue) Exposed exposed. There is no tunneling or  undermining noted. There is a small amount of serosanguineous drainage noted. The wound margin is flat and intact. There is large (67-100%) red granulation within the wound bed. There is no necrotic tissue within the wound bed. Wound #7 status is Open. Original cause of wound was Blister. The wound is located on the Right,Posterior Lower Leg. The wound measures 0.5cm length x 0.7cm width x 0.1cm depth; 0.275cm^2 area and 0.027cm^3 volume. There is Fat Layer (Subcutaneous Tissue) Exposed exposed. There is no tunneling or undermining noted. There is a small amount of serosanguineous drainage noted. The wound margin is flat and intact. There is large (67-100%) red granulation within the wound bed. There is no necrotic tissue within the wound bed. 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 There was a Selective/Open Wound Skin/Epidermis Debridement with a total area of 0.25 sq cm performed by [name]., MD. With the following instrument(s): Curette to remove Viable tissue/material. Material removed includes Skin: Dermis and Skin: Epidermis and after achieving pain control using Lidocaine 5% topical ointment. A time out was conducted at 14:24, prior to the start of the procedure. A Moderate amount of bleeding was controlled with Pressure. The procedure was tolerated well with a  pain level of 0 throughout and a pain level of 1 following the procedure. Post Debridement Measurements: 1.5cm length x 2cm width x 0.1cm depth; 0.236cm^3 volume. Character of Wound/Ulcer Post Debridement is improved. Post procedure Diagnosis Wound #1: Same as Pre-Procedure Wound #7 Pre-procedure diagnosis of Wound #7 is a Venous Leg Ulcer located on the Right,Posterior Lower Leg . There was a Four Layer Compression Therapy Procedure with a pre-treatment ABI of 1.1 by Baruch Gouty, RN. Post procedure Diagnosis Wound #7: Same as Pre-Procedure Plan Follow-up Appointments: Return appointment in 3 weeks. - week after Thanksgiving. Thursday. Dressing Change Frequency: Wound #1 Right,Medial Knee: Change Dressing every other day. - home health to change once a week. wife to change all other days. Wound #7 Right,Posterior Lower Leg: Other: - home health to change once a week. wound center to change on Thursdays. Skin Barriers/Peri-Wound Care: Moisturizing lotion - lotion to both legs. patient to apply lotion nightly to left leg. Wound Cleansing: Wound #1 Right,Medial Knee: Other: - may wash with soap and water. Wound #7 Right,Posterior Lower Leg: May shower with protection. - home health may wash legs with soap and water when dressing changes. Primary Wound Dressing: Wound #1 Right,Medial Knee: Silver Collagen - moisten with saline or [place] Jelly Wound #7 Right,Posterior Lower Leg: Silver Collagen - moisten with saline or [place] Jelly Secondary Dressing: Wound #1 Right,Medial Knee: ABD pad - or self-adhesive foam border. Wound #7 Right,Posterior Lower Leg: Dry Gauze Edema Control: 4 layer compression - Right Lower Extremity Avoid standing for long periods of time Elevate legs to the level of the heart or above for 30 minutes daily and/or when sitting, a frequency of: - throughout the day. Exercise regularly Support Garment 20-30 mm/Hg pressure to: - Patient to apply in the morning and  remove at night. Please show patient and wife how apply  to compression stockings. Home Health: [facility] for wound care. [facility] to change once a week. wound center to change on Thursdays. 1. Change primary dressing in both areas to silver collagen 2. The right medial knee ABDs or border foam over the top. 3. 4-layer compression on the right lower leg Electronic Signature(s) Signed: 08/29/2019 5:47:05 PM By: [name] Ham MD Entered By: [name] on 08/29/2019 15:24:45 -------------------------------------------------------------------------------- SuperBill Details Patient Name: Date of Service: [name] 08/29/2019 Medical Record [id number] Patient Account Number: [id number] Date of Birth/Sex: Treating RN: [birth year] (73 y.o. [name], Meta.Reding Primary Care Provider: Laurey [name] Other Clinician: Referring Provider: Treating Provider/Extender:Jayci Ellefson, [name], [name] in Treatment: 6 Diagnosis Coding ICD-10 Codes Code Description I89.0 Lymphedema, not elsewhere classified S81.011D Laceration without foreign body, right knee, subsequent encounter L97.812 Non-pressure chronic ulcer of other part of right lower leg with fat layer exposed L97.811 Non-pressure chronic ulcer of other part of right lower leg limited to breakdown of skin I87.323 Chronic venous hypertension (idiopathic) with inflammation of bilateral lower extremity E11.622 Type 2 diabetes mellitus with other skin ulcer Facility Procedures The patient participates with Medicare or their insurance follows the Medicare Facility Guidelines: CPT4 Code Description Modifier Quantity [id number] - DEBRIDE WOUND 1ST 20 SQ CM OR < 1 ICD-10 Diagnosis Description S81.011D Laceration without  foreign body, right knee, subsequent encounter I89.0 Lymphedema, not elsewhere classified The patient participates with Medicare or their insurance follows the  Medicare Facility Guidelines: [phone] (Facility Use Only) [phone] - [place] [facility] LEG 59 1 Physician Procedures Electronic Signature(s) Signed: 08/29/2019 5:17:30 PM By: [name] Signed: 08/29/2019 5:47:05 PM By: [name] Ham MD Entered By: [name] on 08/29/2019 16:19:04

## 2019-09-02 DIAGNOSIS — L03115 Cellulitis of right lower limb: Secondary | ICD-10-CM | POA: Diagnosis not present

## 2019-09-02 DIAGNOSIS — I87322 Chronic venous hypertension (idiopathic) with inflammation of left lower extremity: Secondary | ICD-10-CM | POA: Diagnosis not present

## 2019-09-02 DIAGNOSIS — I87331 Chronic venous hypertension (idiopathic) with ulcer and inflammation of right lower extremity: Secondary | ICD-10-CM | POA: Diagnosis not present

## 2019-09-02 DIAGNOSIS — L97211 Non-pressure chronic ulcer of right calf limited to breakdown of skin: Secondary | ICD-10-CM | POA: Diagnosis not present

## 2019-09-02 DIAGNOSIS — L97811 Non-pressure chronic ulcer of other part of right lower leg limited to breakdown of skin: Secondary | ICD-10-CM | POA: Diagnosis not present

## 2019-09-02 DIAGNOSIS — S81011D Laceration without foreign body, right knee, subsequent encounter: Secondary | ICD-10-CM | POA: Diagnosis not present

## 2019-09-05 DIAGNOSIS — L97811 Non-pressure chronic ulcer of other part of right lower leg limited to breakdown of skin: Secondary | ICD-10-CM | POA: Diagnosis not present

## 2019-09-05 DIAGNOSIS — L03115 Cellulitis of right lower limb: Secondary | ICD-10-CM | POA: Diagnosis not present

## 2019-09-05 DIAGNOSIS — I87322 Chronic venous hypertension (idiopathic) with inflammation of left lower extremity: Secondary | ICD-10-CM | POA: Diagnosis not present

## 2019-09-05 DIAGNOSIS — L97211 Non-pressure chronic ulcer of right calf limited to breakdown of skin: Secondary | ICD-10-CM | POA: Diagnosis not present

## 2019-09-05 DIAGNOSIS — S81011D Laceration without foreign body, right knee, subsequent encounter: Secondary | ICD-10-CM | POA: Diagnosis not present

## 2019-09-05 DIAGNOSIS — I87331 Chronic venous hypertension (idiopathic) with ulcer and inflammation of right lower extremity: Secondary | ICD-10-CM | POA: Diagnosis not present

## 2019-09-10 DIAGNOSIS — L97811 Non-pressure chronic ulcer of other part of right lower leg limited to breakdown of skin: Secondary | ICD-10-CM | POA: Diagnosis not present

## 2019-09-10 DIAGNOSIS — I87322 Chronic venous hypertension (idiopathic) with inflammation of left lower extremity: Secondary | ICD-10-CM | POA: Diagnosis not present

## 2019-09-10 DIAGNOSIS — L03115 Cellulitis of right lower limb: Secondary | ICD-10-CM | POA: Diagnosis not present

## 2019-09-10 DIAGNOSIS — S81011D Laceration without foreign body, right knee, subsequent encounter: Secondary | ICD-10-CM | POA: Diagnosis not present

## 2019-09-10 DIAGNOSIS — I87331 Chronic venous hypertension (idiopathic) with ulcer and inflammation of right lower extremity: Secondary | ICD-10-CM | POA: Diagnosis not present

## 2019-09-10 DIAGNOSIS — L97211 Non-pressure chronic ulcer of right calf limited to breakdown of skin: Secondary | ICD-10-CM | POA: Diagnosis not present

## 2019-09-13 DIAGNOSIS — L97211 Non-pressure chronic ulcer of right calf limited to breakdown of skin: Secondary | ICD-10-CM | POA: Diagnosis not present

## 2019-09-13 DIAGNOSIS — S81011D Laceration without foreign body, right knee, subsequent encounter: Secondary | ICD-10-CM | POA: Diagnosis not present

## 2019-09-13 DIAGNOSIS — L03115 Cellulitis of right lower limb: Secondary | ICD-10-CM | POA: Diagnosis not present

## 2019-09-13 DIAGNOSIS — I87322 Chronic venous hypertension (idiopathic) with inflammation of left lower extremity: Secondary | ICD-10-CM | POA: Diagnosis not present

## 2019-09-13 DIAGNOSIS — I87331 Chronic venous hypertension (idiopathic) with ulcer and inflammation of right lower extremity: Secondary | ICD-10-CM | POA: Diagnosis not present

## 2019-09-13 DIAGNOSIS — L97811 Non-pressure chronic ulcer of other part of right lower leg limited to breakdown of skin: Secondary | ICD-10-CM | POA: Diagnosis not present

## 2019-09-16 DIAGNOSIS — L03115 Cellulitis of right lower limb: Secondary | ICD-10-CM | POA: Diagnosis not present

## 2019-09-16 DIAGNOSIS — I87331 Chronic venous hypertension (idiopathic) with ulcer and inflammation of right lower extremity: Secondary | ICD-10-CM | POA: Diagnosis not present

## 2019-09-16 DIAGNOSIS — I87322 Chronic venous hypertension (idiopathic) with inflammation of left lower extremity: Secondary | ICD-10-CM | POA: Diagnosis not present

## 2019-09-16 DIAGNOSIS — L97811 Non-pressure chronic ulcer of other part of right lower leg limited to breakdown of skin: Secondary | ICD-10-CM | POA: Diagnosis not present

## 2019-09-16 DIAGNOSIS — S81011D Laceration without foreign body, right knee, subsequent encounter: Secondary | ICD-10-CM | POA: Diagnosis not present

## 2019-09-16 DIAGNOSIS — L97211 Non-pressure chronic ulcer of right calf limited to breakdown of skin: Secondary | ICD-10-CM | POA: Diagnosis not present

## 2019-09-19 ENCOUNTER — Encounter (HOSPITAL_BASED_OUTPATIENT_CLINIC_OR_DEPARTMENT_OTHER): Payer: Medicare Other | Attending: Internal Medicine | Admitting: Internal Medicine

## 2019-09-19 ENCOUNTER — Other Ambulatory Visit: Payer: Self-pay

## 2019-09-19 DIAGNOSIS — I5042 Chronic combined systolic (congestive) and diastolic (congestive) heart failure: Secondary | ICD-10-CM | POA: Diagnosis not present

## 2019-09-19 DIAGNOSIS — I87323 Chronic venous hypertension (idiopathic) with inflammation of bilateral lower extremity: Secondary | ICD-10-CM | POA: Diagnosis not present

## 2019-09-19 DIAGNOSIS — E1140 Type 2 diabetes mellitus with diabetic neuropathy, unspecified: Secondary | ICD-10-CM | POA: Insufficient documentation

## 2019-09-19 DIAGNOSIS — I872 Venous insufficiency (chronic) (peripheral): Secondary | ICD-10-CM | POA: Insufficient documentation

## 2019-09-19 DIAGNOSIS — Z9221 Personal history of antineoplastic chemotherapy: Secondary | ICD-10-CM | POA: Insufficient documentation

## 2019-09-19 DIAGNOSIS — E11622 Type 2 diabetes mellitus with other skin ulcer: Secondary | ICD-10-CM | POA: Insufficient documentation

## 2019-09-19 DIAGNOSIS — L97812 Non-pressure chronic ulcer of other part of right lower leg with fat layer exposed: Secondary | ICD-10-CM | POA: Insufficient documentation

## 2019-09-19 DIAGNOSIS — I482 Chronic atrial fibrillation, unspecified: Secondary | ICD-10-CM | POA: Insufficient documentation

## 2019-09-19 DIAGNOSIS — E1151 Type 2 diabetes mellitus with diabetic peripheral angiopathy without gangrene: Secondary | ICD-10-CM | POA: Insufficient documentation

## 2019-09-19 DIAGNOSIS — I890 Lymphedema, not elsewhere classified: Secondary | ICD-10-CM | POA: Diagnosis not present

## 2019-09-19 DIAGNOSIS — S81011D Laceration without foreign body, right knee, subsequent encounter: Secondary | ICD-10-CM | POA: Insufficient documentation

## 2019-09-19 DIAGNOSIS — I429 Cardiomyopathy, unspecified: Secondary | ICD-10-CM | POA: Insufficient documentation

## 2019-09-19 DIAGNOSIS — L97811 Non-pressure chronic ulcer of other part of right lower leg limited to breakdown of skin: Secondary | ICD-10-CM | POA: Diagnosis not present

## 2019-09-19 DIAGNOSIS — Z8572 Personal history of non-Hodgkin lymphomas: Secondary | ICD-10-CM | POA: Insufficient documentation

## 2019-09-19 DIAGNOSIS — X58XXXD Exposure to other specified factors, subsequent encounter: Secondary | ICD-10-CM | POA: Insufficient documentation

## 2019-09-19 DIAGNOSIS — I110 Hypertensive heart disease with heart failure: Secondary | ICD-10-CM | POA: Insufficient documentation

## 2019-09-19 DIAGNOSIS — S81001A Unspecified open wound, right knee, initial encounter: Secondary | ICD-10-CM | POA: Diagnosis not present

## 2019-09-19 DIAGNOSIS — G4730 Sleep apnea, unspecified: Secondary | ICD-10-CM | POA: Insufficient documentation

## 2019-09-20 DIAGNOSIS — I5030 Unspecified diastolic (congestive) heart failure: Secondary | ICD-10-CM | POA: Diagnosis not present

## 2019-09-20 DIAGNOSIS — L97811 Non-pressure chronic ulcer of other part of right lower leg limited to breakdown of skin: Secondary | ICD-10-CM | POA: Diagnosis not present

## 2019-09-20 DIAGNOSIS — I890 Lymphedema, not elsewhere classified: Secondary | ICD-10-CM | POA: Diagnosis not present

## 2019-09-20 DIAGNOSIS — E1140 Type 2 diabetes mellitus with diabetic neuropathy, unspecified: Secondary | ICD-10-CM | POA: Diagnosis not present

## 2019-09-20 DIAGNOSIS — I429 Cardiomyopathy, unspecified: Secondary | ICD-10-CM | POA: Diagnosis not present

## 2019-09-20 DIAGNOSIS — I87331 Chronic venous hypertension (idiopathic) with ulcer and inflammation of right lower extremity: Secondary | ICD-10-CM | POA: Diagnosis not present

## 2019-09-20 DIAGNOSIS — G4733 Obstructive sleep apnea (adult) (pediatric): Secondary | ICD-10-CM | POA: Diagnosis not present

## 2019-09-20 DIAGNOSIS — I87302 Chronic venous hypertension (idiopathic) without complications of left lower extremity: Secondary | ICD-10-CM | POA: Diagnosis not present

## 2019-09-20 DIAGNOSIS — L03115 Cellulitis of right lower limb: Secondary | ICD-10-CM | POA: Diagnosis not present

## 2019-09-20 DIAGNOSIS — I444 Left anterior fascicular block: Secondary | ICD-10-CM | POA: Diagnosis not present

## 2019-09-20 DIAGNOSIS — Z7901 Long term (current) use of anticoagulants: Secondary | ICD-10-CM | POA: Diagnosis not present

## 2019-09-20 DIAGNOSIS — F329 Major depressive disorder, single episode, unspecified: Secondary | ICD-10-CM | POA: Diagnosis not present

## 2019-09-20 DIAGNOSIS — Z7984 Long term (current) use of oral hypoglycemic drugs: Secondary | ICD-10-CM | POA: Diagnosis not present

## 2019-09-20 DIAGNOSIS — R32 Unspecified urinary incontinence: Secondary | ICD-10-CM | POA: Diagnosis not present

## 2019-09-20 DIAGNOSIS — G319 Degenerative disease of nervous system, unspecified: Secondary | ICD-10-CM | POA: Diagnosis not present

## 2019-09-20 DIAGNOSIS — I110 Hypertensive heart disease with heart failure: Secondary | ICD-10-CM | POA: Diagnosis not present

## 2019-09-20 DIAGNOSIS — I5022 Chronic systolic (congestive) heart failure: Secondary | ICD-10-CM | POA: Diagnosis not present

## 2019-09-20 DIAGNOSIS — G9341 Metabolic encephalopathy: Secondary | ICD-10-CM | POA: Diagnosis not present

## 2019-09-20 DIAGNOSIS — E785 Hyperlipidemia, unspecified: Secondary | ICD-10-CM | POA: Diagnosis not present

## 2019-09-20 DIAGNOSIS — I482 Chronic atrial fibrillation, unspecified: Secondary | ICD-10-CM | POA: Diagnosis not present

## 2019-09-20 DIAGNOSIS — Z6841 Body Mass Index (BMI) 40.0 and over, adult: Secondary | ICD-10-CM | POA: Diagnosis not present

## 2019-09-20 DIAGNOSIS — L97211 Non-pressure chronic ulcer of right calf limited to breakdown of skin: Secondary | ICD-10-CM | POA: Diagnosis not present

## 2019-09-20 DIAGNOSIS — Z8572 Personal history of non-Hodgkin lymphomas: Secondary | ICD-10-CM | POA: Diagnosis not present

## 2019-09-20 DIAGNOSIS — Z87891 Personal history of nicotine dependence: Secondary | ICD-10-CM | POA: Diagnosis not present

## 2019-09-20 NOTE — Progress Notes (Signed)
[name], [name] ([id number]) Visit Report for 09/19/2019 HPI Details Patient Name: Date of Service: [name], [name] 09/19/2019 12:30 PM Medical Record [id number] Patient Account Number: [id number] Date of Birth/Sex: Treating RN: 19-May-1946 (73 y.o. M) Primary Care Provider: Laurey [name] Other Clinician: Referring Provider: Treating Provider/Extender:, [name], [name] in Treatment: 9 History of Present Illness HPI Description: ADMISSION 07/18/2019 Patient is a 73 year old man with type 2 diabetes and also chronic edema. He had a fall in July. Fairly substantial wound on the right medial knee and across the top of the patella. This is actually made some progress since then. He was seen in the ED on 8/18 with cellulitis. He was treated with doxycycline. This got extended I think for as long as 30 days. On 9/15 he was seen by his primary felt to have cellulitis with open wounds. He currently has 7 wounds the remnant of the initial contusion injury on the right medial knee which actually I think is actually quite a bit smaller than it was looking at a picture on his wife's cell phone. He also has 6 is wounds on the right lower extremity which includes 2 on the right anterior 3 on the right lateral and one posteriorly. He has compression stockings but has not been wearing them. He says these latter 6 wounds started as blisters that open. He has been using Neosporin. 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 He also has a laceration wound on the lower medial left  thigh just above the knee. We have been using Santyl on the latter, silver alginate on the wounds on the right. The patient does not have an open wound on the left and he has a lot less edema. On the left side. We have given him the number for elastic therapy in [place] for 20/30 stockings on the left leg. I am not certain that that will suffice on the right however 10/13; we received an urgent call from home health this morning reporting that the patient's right leg had a dusky color. We brought him into the clinic for review. He has severe bilateral right greater than left chronic venous insufficiency with secondary lymphedema and has discoloration on the right leg chronically related to this. He also reports he wrapped his upper leg with an Ace wrap and may have [name] this with excessive tension 10/22. The patient still has a clean wound on the upper anterior thigh the remanent of a very large traumatic wound. Small wound in the right posterior calf in the setting of severe chronic venous hypertension. There is nothing open on the left leg and he has stockings 11/12; I think since the last time the patient was here the area on the traumatic wound just above his knee scabbed over and was felt to be healed. During the course of wound preparation today our intake nurse washed the scab off there is still an open wound here. Also still a small area on the posterior calf on the right in the setting of severe chronic venous hypertension. He has not been dressing the right thigh wound. Using silver alginate on the other wounds 12/3; 3-week follow-up  he has. The area just above his knee anteriorly is improved however he has new areas on the right lateral knee and an area on the right lateral calf. I am not sure how this happened or when. The patient was not even sure when this happened. Electronic Signature(s) Signed: 09/20/2019 7:57:15 AM By: Linton Ham MD Entered By: Linton Ham on  09/19/2019 14:16:03 -------------------------------------------------------------------------------- Physical Exam Details Patient Name: Date of Service: [name], [name] 09/19/2019 12:30 PM Medical Record [id number] Patient Account Number: [id number] Date of Birth/Sex: Treating RN: 05-Feb-1946 (73 y.o. M) Primary Care Provider: Laurey [name] Other Clinician: Referring Provider: Treating Provider/Extender:, [name], [name] in Treatment: 9 Constitutional Sitting or standing Blood Pressure is within target range for patient.. Pulse regular and within target range for patient.Marland Kitchen Respirations regular, non-labored and within target range.. Temperature is normal and within the target range for the patient.Marland Kitchen Appears in no distress. Eyes Conjunctivae clear. No discharge.no icterus. Respiratory work of breathing is normal. Cardiovascular Pedal pulses palpable and strong bilaterally.. Edema is well controlled. Integumentary (Hair, Skin) Changes of severe chronic venous insufficiency in the skin as usual nothing else beyond the wounds listed below. Psychiatric appears at normal baseline. Notes Wound exam; the area on the right thigh just above the knee is smaller and looks healthy. The right posterior calf wound is closed. He has a superficial area on the right lateral calf that presumably would have occurred under compression he also has a new area on the right lateral knee. These are superficial areas and was simply loss of surface epithelium Electronic Signature(s) Signed: 09/20/2019 7:57:15 AM By: Linton Ham MD Entered By: Linton Ham on 09/19/2019 14:17:56 -------------------------------------------------------------------------------- Physician Orders Details Patient Name: Date of Service: [name] 09/19/2019 12:30 PM Medical Record [id number] Patient Account Number: [id number] Date of Birth/Sex: Treating RN: [birth year] (73 y.o. [name], Meta.Reding Primary Care Provider: Other Clinician: Laurey [name] Referring Provider: Treating Provider/Extender:, [name], [name] in Treatment: 9 Verbal / Phone Orders: No Diagnosis Coding ICD-10 Coding Code Description I89.0 Lymphedema, not elsewhere classified S81.011D Laceration without foreign body, right knee, subsequent encounter L97.812 Non-pressure chronic ulcer of other part of right lower leg with fat layer exposed L97.811 Non-pressure chronic ulcer of other part of right lower leg limited to breakdown of skin I87.323 Chronic venous hypertension (idiopathic) with inflammation of bilateral lower extremity E11.622 Type 2 diabetes mellitus with other skin ulcer Follow-up Appointments Return Appointment in 2 weeks. Dressing Change Frequency Change Dressing every other day. - home health to change once a week. wife to change all other days. Skin Barriers/Peri-Wound Care Moisturizing lotion - lotion to both legs. patient to apply lotion nightly to left leg. Wound Cleansing Wound #1 Right,Medial Knee Other: - may wash with soap and water. Primary Wound Dressing Wound #1 Right,Medial Knee Silver Collagen - moisten with saline or [place] Jelly Wound #8 Right,Lateral Knee Silver Collagen - moisten with saline or [place] Jelly Wound #9 Right,Lateral Lower Leg Silver Collagen - moisten with saline or [place] Jelly Secondary Dressing Wound #1 Right,Medial Knee ABD pad - or self-adhesive foam border. Wound #8 Right,Lateral Knee ABD pad - or self-adhesive foam border. Wound #9 Right,Lateral Lower Leg ABD pad Edema Control 4 layer compression - Right Lower Extremity Avoid standing for long periods of time Elevate legs to the level of the heart or above for 30 minutes daily and/or when sitting, a frequency of: - throughout the day. Exercise regularly Support Garment 20-30 mm/Hg pressure  to: - Patient to apply in the morning and remove at night. Please show patient and  wife how apply to compression stockings. [facility] skilled [facility] for wound care. [facility] to change once a week. wound center to change on Thursdays. Electronic Signature(s) Signed: 09/19/2019 6:37:14 PM By: Deon Pilling Signed: 09/20/2019 7:57:15 AM By: Linton Ham MD Entered By: Deon Pilling on 09/19/2019 14:03:16 -------------------------------------------------------------------------------- Problem List Details Patient Name: Date of Service: [place], FRIEDEN 09/19/2019 12:30 PM Medical Record [id number] Patient Account Number: [id number] Date of Birth/Sex: Treating RN: 1946/04/26 (73 y.o. M) Primary Care Provider: Laurey [name] Other Clinician: Referring Provider: Treating Provider/Extender:, [name], [name] in Treatment: 9 Active Problems ICD-10 Evaluated Encounter Code Description Active Date Today Diagnosis I89.0 Lymphedema, not elsewhere classified 07/18/2019 No Yes S81.011D Laceration without foreign body, right knee, 07/18/2019 No Yes subsequent encounter L97.812 Non-pressure chronic ulcer of other part of right lower 07/18/2019 No Yes leg with fat layer exposed L97.811 Non-pressure chronic ulcer of other part of right lower 07/18/2019 No Yes leg limited to breakdown of skin I87.323 Chronic venous hypertension (idiopathic) with 07/18/2019 No Yes inflammation of bilateral lower extremity E11.622 Type 2 diabetes mellitus with other skin ulcer 07/18/2019 No Yes Inactive Problems Resolved Problems Electronic Signature(s) Signed: 09/20/2019 7:57:15 AM By: Linton Ham MD Entered By: Linton Ham on 09/19/2019 14:14:06 -------------------------------------------------------------------------------- Progress Note Details Patient Name: Date of Service: [name] 09/19/2019 12:30 PM Medical Record [id number] Patient Account Number: [id number] Date of Birth/Sex: Treating RN: December 03, 1945  (73 y.o. M) Primary Care Provider: Laurey [name] Other Clinician: Referring Provider: Treating Provider/Extender:, [name], [name] in Treatment: 9 Subjective History of Present Illness (HPI) ADMISSION 07/18/2019 Patient is a 73 year old man with type 2 diabetes and also chronic edema. He had a fall in July. Fairly substantial wound on the right medial knee and across the top of the patella. This is actually made some progress since then. He was seen in the ED on 8/18 with cellulitis. He was treated with doxycycline. This got extended I think for as long as 30 days. On 9/15 he was seen by his primary felt to have cellulitis with open wounds. He currently has 7 wounds the remnant of the initial contusion injury on the right medial knee which actually I think is actually quite a bit smaller than it was looking at a picture on his wife's cell phone. He also has 6 is wounds on the right lower extremity which includes 2 on the right anterior 3 on the right lateral and one posteriorly. He has compression stockings but has not been wearing them. He says these latter 6 wounds started as blisters that open. He has been using Neosporin. 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 He also has a laceration wound on the lower medial left thigh just above the knee. We have been using Santyl on the latter, silver alginate on the wounds on the right. The patient does not have an open wound on the left and he has a lot less edema. On the left side. We have given him the number for elastic  therapy in [place] for 20/30 stockings on the left leg. I am not certain that that will suffice on the right however 10/13; we received an urgent call from home health this morning reporting that the patient's right leg had a dusky color. We brought him into the clinic for review. He has severe bilateral right greater than left chronic venous insufficiency with secondary lymphedema and has discoloration on the right leg chronically related to this. He also reports he wrapped his upper leg with an Ace wrap and may have [name] this with excessive tension 10/22. The patient still has a clean wound on the upper anterior thigh the remanent of a very large traumatic wound. Small wound in the right posterior calf in the setting of severe chronic venous hypertension. There is nothing open on the left leg and he has stockings 11/12; I think since the last time the patient was here the area on the traumatic wound just above his knee scabbed over and was felt to be healed. During the course of wound preparation today our intake nurse washed the scab off there is still an open wound here. Also still a small area on the posterior calf on the right in the setting of severe chronic venous hypertension. He has not been dressing the right thigh wound. Using silver alginate on the other wounds 12/3; 3-week follow-up he has. The area just above his knee anteriorly is improved however he has new areas on the right lateral knee and an area on the right lateral calf. I am not sure how this happened or when. The patient was not even sure when this happened. Objective Constitutional Sitting or standing Blood Pressure is within target range for patient.. Pulse regular and within target range for patient.Marland Kitchen Respirations regular, non-labored and within target range.. Temperature is normal and within the target range for the patient.Marland Kitchen Appears in no distress. Vitals Time Taken: 1:20 PM, Height: 77 in, Weight: 350 lbs, BMI:  41.5, Temperature: 98.4 F, Pulse: 49 bpm, Respiratory Rate: 18 breaths/min, Blood Pressure: 135/57 mmHg. Eyes Conjunctivae clear. No discharge.no icterus. Respiratory work of breathing is normal. Cardiovascular Pedal pulses palpable and strong bilaterally.. Edema is well controlled. Psychiatric appears at normal baseline. General Notes: Wound exam; the area on the right thigh just above the knee is smaller and looks healthy. The right posterior calf wound is closed. He has a superficial area on the right lateral calf that presumably would have occurred under compression he also has a new area on the right lateral knee. These are superficial areas and was simply loss of surface epithelium Integumentary (Hair, Skin) Changes of severe chronic venous insufficiency in the skin as usual nothing else beyond the wounds listed below. Wound #1 status is Open. Original cause of wound was Trauma. The wound is located on the Right,Medial Knee. The wound measures 0.5cm length x 0.5cm width x 0.1cm depth; 0.196cm^2 area and 0.02cm^3 volume. There is Fat Layer (Subcutaneous Tissue) Exposed exposed. There is no tunneling or undermining noted. There is a small amount of serosanguineous drainage noted. The wound margin is flat and intact. There is large (67-100%) red granulation within the wound bed. There is no necrotic tissue within the wound bed. Wound #7 status is Open. Original cause of wound was Blister. The wound is located on the Right,Posterior Lower Leg. The wound measures 0cm length x 0cm width x 0cm depth; 0cm^2 area and 0cm^3 volume. There is no tunneling or undermining noted. There is a none present amount of  drainage noted. The wound margin is flat and intact. There is no granulation within the wound bed. There is no necrotic tissue within the wound bed. Wound #8 status is Open. Original cause of wound was Blister. The wound is located on the Right,Lateral Knee. The wound measures 3cm length  x 2cm width x 0.1cm depth; 4.712cm^2 area and 0.471cm^3 volume. There is Fat Layer (Subcutaneous Tissue) Exposed exposed. There is no tunneling or undermining noted. There is a small amount of serosanguineous drainage noted. The wound margin is distinct with the outline attached to the wound base. There is large (67-100%) pink granulation within the wound bed. Wound #9 status is Open. Original cause of wound was Blister. The wound is located on the Right,Lateral Lower Leg. The wound measures 0.5cm length x 1.5cm width x 0.1cm depth; 0.589cm^2 area and 0.059cm^3 volume. There is Fat Layer (Subcutaneous Tissue) Exposed exposed. There is no tunneling or undermining noted. There is a small amount of serosanguineous drainage noted. The wound margin is distinct with the outline attached to the wound base. There is large (67-100%) pink granulation within the wound bed. There is no necrotic tissue within the wound bed. Assessment Active Problems ICD-10 Lymphedema, not elsewhere classified Laceration without foreign body, right knee, subsequent encounter Non-pressure chronic ulcer of other part of right lower leg with fat layer exposed Non-pressure chronic ulcer of other part of right lower leg limited to breakdown of skin Chronic venous hypertension (idiopathic) with inflammation of bilateral lower extremity Type 2 diabetes mellitus with other skin ulcer Procedures Wound #9 Pre-procedure diagnosis of Wound #9 is a Diabetic Wound/Ulcer of the Lower Extremity located on the Right,Lateral Lower Leg . There was a Four Layer Compression Therapy Procedure with a pre-treatment ABI of 1.1 by Baruch Gouty, RN. Post procedure Diagnosis Wound #9: Same as Pre-Procedure Plan Follow-up Appointments: Return Appointment in 2 weeks. Dressing Change Frequency: Change Dressing every other day. - home health to change once a week. wife to change all other days. Skin Barriers/Peri-Wound Care: Moisturizing  lotion - lotion to both legs. patient to apply lotion nightly to left leg. Wound Cleansing: Wound #1 Right,Medial Knee: Other: - may wash with soap and water. Primary Wound Dressing: Wound #1 Right,Medial Knee: Silver Collagen - moisten with saline or [place] Jelly Wound #8 Right,Lateral Knee: Silver Collagen - moisten with saline or [place] Jelly Wound #9 Right,Lateral Lower Leg: Silver Collagen - moisten with saline or [place] Jelly Secondary Dressing: Wound #1 Right,Medial Knee: ABD pad - or self-adhesive foam border. Wound #8 Right,Lateral Knee: ABD pad - or self-adhesive foam border. Wound #9 Right,Lateral Lower Leg: ABD pad Edema Control: 4 layer compression - Right Lower Extremity Avoid standing for long periods of time Elevate legs to the level of the heart or above for 30 minutes daily and/or when sitting, a frequency of: - throughout the day. Exercise regularly Support Garment 20-30 mm/Hg pressure to: - Patient to apply in the morning and remove at night. Please show patient and wife how apply to compression stockings. Home Health: [facility] for wound care. [facility] to change once a week. wound center to change on Thursdays. 1. Continuing silver collagen to all wound areas. 2. ABDs under 4-layer compression 3. He is changing the areas on his knee himself every second day. 4. The pathogenesis of the new areas is not really clear presumably minor trauma or wrap injury Electronic Signature(s) Signed: 09/20/2019 7:57:15 AM By: Linton Ham MD Entered By: Dellia Nims,   on 09/19/2019 14:18:53 -------------------------------------------------------------------------------- SuperBill Details Patient Name: Date of Service: ZADE, FALKNER 09/19/2019 Medical Record [id number] Patient Account Number: [id number] Date of Birth/Sex: Treating RN: 1946-04-08 (73 y.o. [name], Meta.Reding Primary Care Provider: Laurey [name] Other  Clinician: Referring Provider: Treating Provider/Extender:, [name], [name] in Treatment: 9 Diagnosis Coding ICD-10 Codes Code Description I89.0 Lymphedema, not elsewhere classified S81.011D Laceration without foreign body, right knee, subsequent encounter L97.812 Non-pressure chronic ulcer of other part of right lower leg with fat layer exposed L97.811 Non-pressure chronic ulcer of other part of right lower leg limited to breakdown of skin I87.323 Chronic venous hypertension (idiopathic) with inflammation of bilateral lower extremity E11.622 Type 2 diabetes mellitus with other skin ulcer Facility Procedures Physician Procedures CPT4 Code Description: [id number] - WC PHYS LEVEL 3 - EST PT ICD-10 Diagnosis Description C34.035 Non-pressure chronic ulcer of other part of right lower S81.011D Laceration without foreign body, right knee, subsequent Modifier: leg with fat lay encounter Quantity: 1 er exposed Electronic Signature(s) Signed: 09/20/2019 7:57:15 AM By: Linton Ham MD Entered By: Linton Ham on 09/19/2019 14:19:23

## 2019-09-23 DIAGNOSIS — L97211 Non-pressure chronic ulcer of right calf limited to breakdown of skin: Secondary | ICD-10-CM | POA: Diagnosis not present

## 2019-09-23 DIAGNOSIS — L03115 Cellulitis of right lower limb: Secondary | ICD-10-CM | POA: Diagnosis not present

## 2019-09-23 DIAGNOSIS — I110 Hypertensive heart disease with heart failure: Secondary | ICD-10-CM | POA: Diagnosis not present

## 2019-09-23 DIAGNOSIS — I87331 Chronic venous hypertension (idiopathic) with ulcer and inflammation of right lower extremity: Secondary | ICD-10-CM | POA: Diagnosis not present

## 2019-09-23 DIAGNOSIS — L97811 Non-pressure chronic ulcer of other part of right lower leg limited to breakdown of skin: Secondary | ICD-10-CM | POA: Diagnosis not present

## 2019-09-23 DIAGNOSIS — I87302 Chronic venous hypertension (idiopathic) without complications of left lower extremity: Secondary | ICD-10-CM | POA: Diagnosis not present

## 2019-09-24 NOTE — Progress Notes (Signed)
SEELEY, HISSONG ([id number]) Visit Report for 07/25/2019 Arrival Information Details Patient Name: Date of Service: [name], [name] 07/25/2019 2:00 PM Medical Record [id number] Patient Account Number: [id number] Date of Birth/Sex: Treating RN: 1946/10/08 (73 y.o. [name], Tammi Klippel Primary Care Clair Bardwell: [name] Other Clinician: Referring Latina Frank: Treating Kenedee Molesky/Extender:Robson, [name], [name] in Treatment: 1 Visit Information History Since Last Visit Added or deleted any medications: No Patient Arrived: [name] Any new allergies or adverse reactions: No Arrival Time: 14:29 Had a fall or experienced change in No Accompanied By: self activities of daily living that may affect Transfer Assistance: None risk of falls: Patient Requires Transmission- No Signs or symptoms of abuse/neglect since last No Based Precautions: visito Patient Has Alerts: Yes Hospitalized since last visit: No Patient Alerts: Patient on Blood Has Dressing in Place as Prescribed: Yes Thinner Pain Present Now: No Electronic Signature(s) Signed: 09/24/2019 3:02:15 PM By: Sandre Kitty Entered By: Sandre Kitty on 07/25/2019 14:30:18 -------------------------------------------------------------------------------- Compression Therapy Details Patient Name: Date of Service: [name] 07/25/2019 2:00 PM Medical Record [id number] Patient Account Number: [id number] Date of Birth/Sex: Treating RN: December 31, 1945 (73 y.o. [name] Primary Care Nyron Mozer: [name] Other Clinician: Referring Joeline Freer: Treating Willis Kuipers/Extender:Robson, [name], [name] in Treatment: 1 Compression Therapy Performed for Wound Wound #2 Right,Proximal,Anterior Lower Leg Assessment: Performed By: Clinician Kela Millin, RN Compression Type: Four Layer Pre Treatment ABI: 1.1 Post Procedure Diagnosis Same as Pre-procedure Electronic  Signature(s) Signed: 07/25/2019 6:06:48 PM By: Deon Pilling Entered By: Deon Pilling on 07/25/2019 15:11:05 -------------------------------------------------------------------------------- Compression Therapy Details Patient Name: Date of Service: [name], [name] 07/25/2019 2:00 PM Medical Record [id number] Patient Account Number: [id number] Date of Birth/Sex: Treating RN: 09/12/1946 (73 y.o. [name], Tammi Klippel Primary Care Reichen Hutzler: [name] Other Clinician: Referring Iden Stripling: Treating Rosha Cocker/Extender:Robson, [name], [name] in Treatment: 1 Compression Therapy Performed for Wound Wound #7 Right,Posterior Lower Leg Assessment: Performed By: Clinician Kela Millin, RN Compression Type: Four Layer Pre Treatment ABI: 1.1 Post Procedure Diagnosis Same as Pre-procedure Electronic Signature(s) Signed: 07/25/2019 6:06:48 PM By: Deon Pilling Entered By: Deon Pilling on 07/25/2019 15:11:05 -------------------------------------------------------------------------------- Compression Therapy Details Patient Name: Date of Service: ALDAHIR, [name] 07/25/2019 2:00 PM Medical Record [id number] Patient Account Number: [id number] Date of Birth/Sex: Treating RN: Nov 14, 1945 (73 y.o. [name] Primary Care [name]: [name] Other Clinician: Referring Latricia Cerrito: Treating Annesha Delgreco/Extender:Robson, [name], [name] in Treatment: 1 Compression Therapy Performed for Wound Wound #6 Right,Distal Lower Leg Assessment: Performed By: Clinician Kela Millin, RN Compression Type: Four Layer Pre Treatment ABI: 1.1 Post Procedure Diagnosis Same as Pre-procedure Electronic Signature(s) Signed: 07/25/2019 6:06:48 PM By: Deon Pilling Entered By: Deon Pilling on 07/25/2019 15:11:05 -------------------------------------------------------------------------------- Compression Therapy Details Patient Name: Date of  Service: [name], [name] 07/25/2019 2:00 PM Medical Record [id number] Patient Account Number: [id number] Date of Birth/Sex: Treating RN: October 20, 1945 (73 y.o. [name] Primary Care [name]: [name] Other Clinician: Referring [name]: Treating [name]/Extender:Robson, [name], [name] in Treatment: 1 Compression Therapy Performed for Wound Wound #3 Right,Distal,Anterior Lower Leg Assessment: Performed By: Clinician Kela Millin, RN Compression Type: Four Layer Pre Treatment ABI: 1.1 Post Procedure Diagnosis Same as Pre-procedure Electronic Signature(s) Signed: 07/25/2019 6:06:48 PM By: Deon Pilling Entered By: Deon Pilling on 07/25/2019 15:11:05 -------------------------------------------------------------------------------- Compression Therapy Details Patient Name: Date of Service: AYAANSH, [name] 07/25/2019 2:00 PM Medical Record [id number] Patient Account Number: [id number] Date of Birth/Sex: Treating RN: [birth year] (73 y.o. M) [name],  [name] Primary Care [name]: [name] Other Clinician: Referring [name]: Treating [name]/Extender:Robson, [name], [name] in Treatment: 1 Compression Therapy Performed for Wound Non-Wound Location Assessment: Performed By: Clinician Kela Millin, RN Compression Type: Four Layer Location: Lower Extremity, Left Post Procedure Diagnosis Same as Pre-procedure Electronic Signature(s) Signed: 07/25/2019 6:06:48 PM By: Deon Pilling Entered By: Deon Pilling on 07/25/2019 15:11:20 -------------------------------------------------------------------------------- Lower Extremity Assessment Details Patient Name: Date of Service: [name], [name] 07/25/2019 2:00 PM Medical Record [id number] Patient Account Number: [id number] Date of Birth/Sex: Treating RN: [birth year] (73 y.o. [name] Primary Care Corsica Franson: [name] Other Clinician: Referring  Carmie Lanpher: Treating Simone Rodenbeck/Extender:Robson, [name], [name] in Treatment: 1 Edema Assessment Assessed: [Left: Yes] [Right: Yes] Edema: [Left: Yes] [Right: Yes] Calf Left: Right: Point of Measurement: 34 cm From Medial Instep 43 cm 51 cm Ankle Left: Right: Point of Measurement: 13 cm From Medial Instep 28.5 cm 30 cm Vascular Assessment Pulses: Dorsalis Pedis Palpable: [Left:Yes] [Right:Yes] Electronic Signature(s) Signed: 07/25/2019 6:06:48 PM By: Deon Pilling Entered By: Deon Pilling on 07/25/2019 14:55:32 -------------------------------------------------------------------------------- Multi Wound Chart Details Patient Name: Date of Service: [name] 07/25/2019 2:00 PM Medical Record [id number] Patient Account Number: [id number] Date of Birth/Sex: Treating RN: October 26, 1945 (73 y.o. [name], Meta.Reding Primary Care Angle Dirusso: [name] Other Clinician: Referring Leeanna Slaby: Treating Lilli Dewald/Extender:Robson, [name], [name] in Treatment: 1 Vital Signs Height(in): 33 Pulse(bpm): 35 Weight(lbs): 350 Blood Pressure(mmHg): 141/55 Body Mass Index(BMI): 41 Temperature(F): 98.9 Respiratory 18 Rate(breaths/min): Photos: [1:No Photos] [2:No Photos] [3:No Photos] Wound Location: [1:Right Knee - Medial] [2:Right Lower Leg - Anterior, Right Lower Leg - Anterior, Proximal] [3:Distal] Wounding Event: [1:Trauma] [2:Blister] [3:Blister] Primary Etiology: [1:Trauma, Other] [2:Venous Leg Ulcer] [3:Venous Leg Ulcer] Comorbid History: [1:Sleep Apnea, Congestive Sleep Apnea, Congestive Sleep Apnea, Congestive Heart Failure, Hypertension, Heart Failure, Hypertension, Heart Failure, Hypertension, Type II Diabetes, Neuropathy, Received Chemotherapy] [2:Type II Diabetes,  Neuropathy, Received Chemotherapy] [3:Type II Diabetes, Neuropathy, Received Chemotherapy] Date Acquired: [1:05/01/2019] [2:05/01/2019] [3:05/01/2019] Weeks of Treatment: [1:1] [2:1]  [3:1] Wound Status: [1:Open] [2:Open] [3:Open] Clustered Wound: [1:No] [2:Yes] [3:No] Clustered Quantity: [1:N/A] [2:1] [3:N/A] Measurements L x W x D 3.5x4x0.1 [2:0.6x1x0.1] [3:1x0.4x0.1] (cm) Area (cm) : [1:10.996] [2:0.471] [3:0.314] Volume (cm) : [1:1.1] [2:0.047] [3:0.031] % Reduction in Area: [1:63.20%] [2:86.10%] [3:84.60%] % Reduction in Volume: 63.20% [2:86.10%] [3:84.80%] Classification: [1:Full Thickness Without Exposed Support Structures Exposed Support Structures Exposed Support Structures] [2:Full Thickness Without] [3:Full Thickness Without] Exudate Amount: [1:Medium] [2:Small] [3:Medium] Exudate Type: [1:Serosanguineous] [2:Serous] [3:Serous] Exudate Color: [1:red, brown] [2:amber] [3:amber] Wound Margin: [1:Flat and Intact] [2:Flat and Intact] [3:Flat and Intact] Granulation Amount: [1:Medium (34-66%)] [2:Large (67-100%)] [3:Large (67-100%)] Granulation Quality: [1:Red, Pink] [2:Red] [3:Pink] Necrotic Amount: [1:Medium (34-66%)] [2:Small (1-33%)] [3:Small (1-33%)] Exposed Structures: [1:Fascia: No Fat Layer (Subcutaneous Tissue) Exposed: Yes Tissue) Exposed: No Tendon: No Muscle: No Joint: No Bone: No] [2:Fat Layer (Subcutaneous Fat Layer (Subcutaneous Fascia: No Tendon: No Muscle: No Joint: No Bone: No] [3:Tissue) Exposed: Yes  Fascia: No Tendon: No Muscle: No Joint: No Bone: No] Epithelialization: [1:None] [2:None] [3:Small (1-33%)] Debridement: [1:Chemical/Enzymatic/MechanicalN/A] [3:N/A] Pre-procedure [1:15:06] [2:N/A] [3:N/A] Verification/Time Out Taken: Pain Control: [1:Lidocaine 4% Topical Solution] [2:N/A] [3:N/A] Instrument: [1:N/A] [2:N/A] [3:N/A] Bleeding: [1:None] [2:N/A] [3:N/A] Procedural Pain: [1:0] [2:N/A] [3:N/A] Post Procedural Pain: 0 [2:N/A] [3:N/A] Debridement Treatment Procedure was tolerated [2:N/A] [3:N/A] Response: [1:well] Post Debridement [1:3.5x4x0.1] [2:N/A] [3:N/A] Measurements L x W x D (cm) Post Debridement [1:1.1] [2:N/A]  [3:N/A] Volume: (cm) Procedures Performed: [1:Debridement 4] [2:Compression Therapy] [3:Compression Therapy  5 6] Photos: [1:No Photos] [2:No Photos] [3:No Photos] Wound Location: [1:Right, Proximal, Lateral Lower Leg] [2:Right, Lateral Lower Leg] [3:Right Lower Leg - Distal] Wounding Event: [1:Blister] [2:Blister] [3:Blister] Primary Etiology: [1:Venous Leg Ulcer] [2:Venous Leg Ulcer] [3:Venous Leg Ulcer] Comorbid History: [1:N/A] [2:N/A] [3:Sleep Apnea, Congestive Heart Failure, Hypertension, Type II Diabetes, Neuropathy, Received Chemotherapy] Date Acquired: [1:05/01/2019] [2:05/01/2019] [3:05/01/2019] Weeks of Treatment: [1:1] [2:1] [3:1] Wound Status: [1:Open] [2:Open] [3:Open] Clustered Wound: [1:Yes] [2:No] [3:No] Clustered Quantity: [1:N/A] [2:N/A] [3:N/A] Measurements L x W x D [1:0x0x0] [2:0x0x0] [3:3x3.9x0.1] (cm) Area (cm) : [1:0] [2:0] [3:9.189] Volume (cm) : [1:0] [2:0] [3:0.919] % Reduction in Area: [1:100.00%] [2:100.00%] [3:25.70%] % Reduction in Volume: [1:100.00%] [2:100.00%] [3:25.70%] Classification: [1:Full Thickness Without Exposed Support Structures Exposed Support Structures Exposed Support Structures] [2:Full Thickness Without] [3:Full Thickness Without] Exudate Amount: [1:N/A] [2:N/A] [3:Medium] Exudate Type: [1:N/A] [2:N/A] [3:Serosanguineous] Exudate Color: [1:N/A] [2:N/A] [3:red, brown] Wound Margin: [1:N/A] [2:N/A] [3:Flat and Intact] Granulation Amount: [1:N/A] [2:N/A] [3:Large (67-100%)] Granulation Quality: [1:N/A] [2:N/A] [3:Red] Necrotic Amount: [1:N/A] [2:N/A] [3:None Present (0%)] Exposed Structures: [1:N/A] [2:N/A] [3:Fat Layer (Subcutaneous Tissue) Exposed: Yes Fascia: No Tendon: No Muscle: No Joint: No Bone: No] Epithelialization: [1:N/A] [2:N/A] [3:Small (1-33%)] Debridement: [1:N/A] [2:N/A] [3:N/A] Pain Control: [1:N/A] [2:N/A] [3:N/A] Instrument: [1:N/A] [2:N/A] [3:N/A] Bleeding: [1:N/A] [2:N/A] [3:N/A] Procedural Pain: [1:N/A] [2:N/A]  [3:N/A] Post Procedural Pain: [1:N/A] [2:N/A] [3:N/A] Debridement Treatment [1:N/A] [2:N/A] [3:N/A] Response: Post Debridement [1:N/A] [2:N/A] [3:N/A] Measurements L x W x D (cm) Post Debridement [1:N/A] [2:N/A] [3:N/A] Volume: (cm) Procedures Performed: N/A [1:7] [2:N/A N/A] [3:Compression Therapy N/A] Photos: [1:No Photos] [2:N/A] [3:N/A] Wound Location: [1:Right Lower Leg - Posterior N/A] [3:N/A] Wounding Event: [1:Blister] [2:N/A] [3:N/A] Primary Etiology: [1:Venous Leg Ulcer] [2:N/A] [3:N/A] Comorbid History: [1:Sleep Apnea, Congestive N/A Heart Failure, Hypertension, Type II Diabetes, Neuropathy, Received Chemotherapy] [3:N/A] Date Acquired: [1:05/01/2019] [2:N/A] [3:N/A] Weeks of Treatment: [1:1] [2:N/A] [3:N/A] Wound Status: [1:Open] [2:N/A] [3:N/A] Clustered Wound: [1:No] [2:N/A] [3:N/A] Clustered Quantity: [1:N/A] [2:N/A] [3:N/A] Measurements L x W x D 5x5.3x0.1 [2:N/A] [3:N/A] (cm) Area (cm) : [1:20.813] [2:N/A] [3:N/A] Volume (cm) : [1:2.081] [2:N/A] [3:N/A] % Reduction in Area: [1:15.90%] [2:N/A] [3:N/A] % Reduction in Volume: 15.90% [2:N/A] [3:N/A] Classification: [1:Full Thickness Without Exposed Support Structures] [2:N/A] [3:N/A] Exudate Amount: [1:Medium] [2:N/A] [3:N/A] Exudate Type: [1:Serosanguineous] [2:N/A] [3:N/A] Exudate Color: [1:red, brown] [2:N/A] [3:N/A] Wound Margin: [1:Flat and Intact] [2:N/A] [3:N/A] Granulation Amount: [1:Large (67-100%)] [2:N/A] [3:N/A] Granulation Quality: [1:Red] [2:N/A] [3:N/A] Necrotic Amount: [1:None Present (0%)] [2:N/A] [3:N/A] Exposed Structures: [1:Fat Layer (Subcutaneous N/A Tissue) Exposed: Yes Fascia: No Tendon: No Muscle: No Joint: No Bone: No] [3:N/A] Epithelialization: [1:None] [2:N/A] [3:N/A] Debridement: [1:N/A] [2:N/A] [3:N/A] Pain Control: [1:N/A] [2:N/A] [3:N/A] Instrument: [1:N/A] [2:N/A] [3:N/A] Bleeding: [1:N/A] [2:N/A] [3:N/A] Procedural Pain: [1:N/A] [2:N/A] [3:N/A] Post Procedural Pain: N/A  [2:N/A] [3:N/A] Debridement Treatment N/A [2:N/A] [3:N/A] Response: Post Debridement [1:N/A] [2:N/A] [3:N/A] Measurements L x W x D (cm) Post Debridement [1:N/A] [2:N/A] [3:N/A] Volume: (cm) Procedures Performed: Compression Therapy [2:N/A] [3:N/A] Treatment Notes Electronic Signature(s) Signed: 07/25/2019 5:49:31 PM By: Linton Ham MD Signed: 07/25/2019 6:06:48 PM By: Deon Pilling Entered By: Linton Ham on 07/25/2019 15:14:28 -------------------------------------------------------------------------------- Multi-Disciplinary Care Plan Details Patient Name: Date of Service: [name] 07/25/2019 2:00 PM Medical Record [id number] Patient Account Number: [id number] Date of Birth/Sex: Treating RN: 19-Jun-1946 (73 y.o. [name] Primary Care Amaka Gluth: [name] Other Clinician: Referring Dhyan Noah: Treating Jalene Lacko/Extender:Robson, [name], [name] in Treatment: 1 Active Inactive Nutrition Nursing Diagnoses: Potential for alteratiion in Nutrition/Potential for imbalanced nutrition Goals: Patient/caregiver  agrees to and verbalizes understanding of need to obtain nutritional consultation Date Initiated: 07/18/2019 Target Resolution Date: 08/23/2019 Goal Status: Active Interventions: Assess patient nutrition upon admission and as needed per policy Provide education on nutrition Treatment Activities: Education provided on Nutrition : 07/18/2019 Patient referred to Primary Care Physician for further nutritional evaluation : 07/18/2019 Notes: Pain, Acute or Chronic Nursing Diagnoses: Pain, acute or chronic: actual or potential Potential alteration in comfort, pain Goals: Patient will verbalize adequate pain control and receive pain control interventions during procedures as needed Date Initiated: 07/18/2019 Target Resolution Date: 08/23/2019 Goal Status: Active Patient/caregiver will verbalize comfort level met Date Initiated:  07/18/2019 Target Resolution Date: 08/23/2019 Goal Status: Active Interventions: Encourage patient to take pain medications as prescribed Provide education on pain management Reposition patient for comfort Treatment Activities: Administer pain control measures as ordered : 07/18/2019 Notes: Electronic Signature(s) Signed: 07/25/2019 6:06:48 PM By: Deon Pilling Entered By: Deon Pilling on 07/25/2019 14:48:07 -------------------------------------------------------------------------------- Pain Assessment Details Patient Name: Date of Service: [name] 07/25/2019 2:00 PM Medical Record [id number] Patient Account Number: [id number] Date of Birth/Sex: Treating RN: June 13, 1946 (73 y.o. [name] Primary Care Desmond Szabo: [name] Other Clinician: Referring Rizwan Kuyper: Treating Melbourne Jakubiak/Extender:Robson, [name], [name] in Treatment: 1 Active Problems Location of Pain Severity and Description of Pain Patient Has Paino No Site Locations Pain Management and Medication Current Pain Management: Electronic Signature(s) Signed: 07/25/2019 6:06:48 PM By: Deon Pilling Signed: 09/24/2019 3:02:15 PM By: Sandre Kitty Entered By: Sandre Kitty on 07/25/2019 14:30:40 -------------------------------------------------------------------------------- Patient/Caregiver Education Details Patient Name: Ma Rings C. Date of Service: 10/8/2020andnbsp2:00 PM Medical Record [id number] Patient Account Number: [id number] Date of Birth/Gender: Feb 28, 1946 (73 y.o. M) Treating RN: Deon Pilling Primary Care Physician: [name] Other Clinician: Referring Physician: Treating Physician/Extender:Robson, [name], [name] in Treatment: 1 Education Assessment Education Provided To: Patient Education Topics Provided Nutrition: Handouts: Nutrition Methods: Explain/Verbal Responses: Reinforcements needed Electronic Signature(s) Signed:  07/25/2019 6:06:48 PM By: Deon Pilling Entered By: Deon Pilling on 07/25/2019 14:48:22 -------------------------------------------------------------------------------- Wound Assessment Details Patient Name: Date of Service: JAMONTAE, THWAITES 07/25/2019 2:00 PM Medical Record [id number] Patient Account Number: [id number] Date of Birth/Sex: Treating RN: 1946/02/17 (73 y.o. [name], Meta.Reding Primary Care Jerman Tinnon: [name] Other Clinician: Referring Marshaun Lortie: Treating Altheia Shafran/Extender:Robson, [name], [name] in Treatment: 1 Wound Status Wound Number: 1 Primary Trauma, Other Etiology: Wound Location: Right Knee - Medial Wound Open Wounding Event: Trauma Status: Date Acquired: 05/01/2019 Comorbid Sleep Apnea, Congestive Heart Failure, Weeks Of Treatment: 1 History: Hypertension, Type II Diabetes, Neuropathy, Clustered Wound: No Received Chemotherapy Photos Wound Measurements Length: (cm) 3.5 % Reduct Width: (cm) 4 % Reduct Depth: (cm) 0.1 Epitheli Area: (cm) 10.996 Tunneli Volume: (cm) 1.1 Undermi Wound Description Classification: Full Thickness Without Exposed Support Foul Odo Structures Slough/F Wound Flat and Intact Margin: Exudate Medium Amount: Exudate Serosanguineous Type: Exudate red, brown Color: Wound Bed Granulation Amount: Medium (34-66%) Granulation Quality: Red, Pink Fascia E Necrotic Amount: Medium (34-66%) Fat Laye Necrotic Quality: Adherent Slough Tendon E Muscle E Joint Ex Bone Exp r After Cleansing: No ibrino Yes Exposed Structure xposed: No r (Subcutaneous Tissue) Exposed: No xposed: No xposed: No posed: No osed: No ion in Area: 63.2% ion in Volume: 63.2% alization: None ng: No ning: No Electronic Signature(s) Signed: 07/26/2019 6:01:54 PM By: Deon Pilling Signed: 07/29/2019 3:43:33 PM By: Mikeal Hawthorne EMT/HBOT Previous Signature: 07/25/2019 6:06:48 PM Version By: Deon Pilling Entered By: Mikeal Hawthorne  on 07/26/2019 08:24:24 --------------------------------------------------------------------------------  Wound Assessment Details Patient Name: Date of Service: HOUA, ACKERT 07/25/2019 2:00 PM Medical Record [id number] Patient Account Number: [id number] Date of Birth/Sex: Treating RN: Jun 19, 1946 (73 y.o. [name], Meta.Reding Primary Care Jaksen Fiorella: [name] Other Clinician: Referring Colyn Miron: Treating Duyen Beckom/Extender:Robson, [name], [name] in Treatment: 1 Wound Status Wound Number: 2 Primary Venous Leg Ulcer Etiology: Wound Location: Right Lower Leg - Anterior, Proximal Wound Open Status: Wounding Event: Blister Comorbid Sleep Apnea, Congestive Heart Failure, Date Acquired: 05/01/2019 History: Hypertension, Type II Diabetes, Neuropathy, Weeks Of Treatment: 1 Received Chemotherapy Clustered Wound: Yes Photos Wound Measurements Length: (cm) 0.6 % Reduct Width: (cm) 1 % Reduct Depth: (cm) 0.1 Epitheli Clustered Quantity: 1 Tunnelin Area: (cm) 0.471 Undermi Volume: (cm) 0.047 Wound Description Classification: Full Thickness Without Exposed Support Foul Odo Structures Slough/F Wound Flat and Intact Margin: Exudate Small Amount: Exudate Serous Type: Exudate amber Color: Wound Bed Granulation Amount: Large (67-100%) Granulation Quality: Red Fascia E Necrotic Amount: Small (1-33%) Fat Laye Necrotic Quality: Adherent Slough Tendon E Muscle E Joint Ex Bone Exp r After Cleansing: No ibrino Yes Exposed Structure xposed: No r (Subcutaneous Tissue) Exposed: Yes xposed: No xposed: No posed: No osed: No ion in Area: 86.1% ion in Volume: 86.1% alization: None g: No ning: No Electronic Signature(s) Signed: 07/26/2019 6:01:54 PM By: Deon Pilling Signed: 07/29/2019 3:43:33 PM By: Mikeal Hawthorne EMT/HBOT Previous Signature: 07/25/2019 6:06:48 PM Version By: Deon Pilling Entered By: Mikeal Hawthorne on 07/26/2019  08:26:09 -------------------------------------------------------------------------------- Wound Assessment Details Patient Name: Date of Service: [name] 07/25/2019 2:00 PM Medical Record [id number] Patient Account Number: [id number] Date of Birth/Sex: Treating RN: [birth year] (73 y.o. [name], Meta.Reding Primary Care Jamylah Marinaccio: [name] Other Clinician: Referring Casaundra Takacs: Treating Ringo Sherod/Extender:Robson, [name], [name] in Treatment: 1 Wound Status Wound Number: 3 Primary Venous Leg Ulcer Etiology: Wound Location: Right Lower Leg - Anterior, Distal Wound Open Wounding Event: Blister Status: Date Acquired: 05/01/2019 Comorbid Sleep Apnea, Congestive Heart Failure, Weeks Of Treatment: 1 History: Hypertension, Type II Diabetes, Neuropathy, Clustered Wound: No Received Chemotherapy Photos Wound Measurements Length: (cm) 1 % Reduct Width: (cm) 0.4 % Reduct Depth: (cm) 0.1 Epitheli Area: (cm) 0.314 Tunneli Volume: (cm) 0.031 Undermi Wound Description Classification: Full Thickness Without Exposed Support Foul Odo Structures Slough/F Wound Flat and Intact Margin: Exudate Medium Amount: Exudate Serous Type: Exudate amber Color: Wound Bed Granulation Amount: Large (67-100%) Granulation Quality: Pink Fascia E Necrotic Amount: Small (1-33%) Fat Laye Necrotic Quality: Adherent Slough Tendon E Muscle E Joint Ex Bone Exp r After Cleansing: No ibrino Yes Exposed Structure xposed: No r (Subcutaneous Tissue) Exposed: Yes xposed: No xposed: No posed: No osed: No ion in Area: 84.6% ion in Volume: 84.8% alization: Small (1-33%) ng: No ning: No Electronic Signature(s) Signed: 07/26/2019 6:01:54 PM By: Deon Pilling Signed: 07/29/2019 3:43:33 PM By: Mikeal Hawthorne EMT/HBOT Previous Signature: 07/25/2019 6:06:48 PM Version By: Deon Pilling Entered By: Mikeal Hawthorne on 07/26/2019  08:25:48 -------------------------------------------------------------------------------- Wound Assessment Details Patient Name: Date of Service: [name] 07/25/2019 2:00 PM Medical Record [id number] Patient Account Number: [id number] Date of Birth/Sex: Treating RN: [birth year] (73 y.o. [name] Primary Care Levaeh Vice: [name] Other Clinician: Referring Terrie Grajales: Treating Borden Thune/Extender:Robson, [name], [name] in Treatment: 1 Wound Status Wound Number: 4 Primary Venous Leg Ulcer Etiology: Wound Location: Right Lower Leg - Lateral, Proximal Wound Healed - Epithelialized Wounding Event: Blister Status: Date Acquired: 05/01/2019 Comorbid Sleep Apnea, Congestive Heart Failure, Weeks Of Treatment: 1 History:  Hypertension, Type II Diabetes, Neuropathy, Clustered Wound: Yes Received Chemotherapy Photos Wound Measurements Length: (cm) 0 % Reduc Width: (cm) 0 % Reduc Depth: (cm) 0 Epithel Clustered Quantity: 2 Area: (cm) 0 Volume: (cm) 0 Wound Description Classification: Full Thickness Without Exposed Support Foul Od Structures Slough/ Wound Flat and Intact Margin: Exudate Medium Amount: Exudate Serosanguineous Type: Exudate red, brown Color: Wound Bed Granulation Amount: Large (67-100%) Granulation Quality: Pink Fascia Necrotic Amount: None Present (0%) Fat Lay Tendon Muscle Joint Ex Bone Exp or After Cleansing: No Fibrino No Exposed Structure Exposed: No er (Subcutaneous Tissue) Exposed: Yes Exposed: No Exposed: No posed: No osed: No tion in Area: 100% tion in Volume: 100% ialization: None Electronic Signature(s) Signed: 07/26/2019 6:01:54 PM By: Deon Pilling Signed: 07/29/2019 3:43:33 PM By: Mikeal Hawthorne EMT/HBOT Previous Signature: 07/25/2019 6:06:48 PM Version By: Deon Pilling Entered By: Mikeal Hawthorne on 07/26/2019  08:26:34 -------------------------------------------------------------------------------- Wound Assessment Details Patient Name: Date of Service: [name] 07/25/2019 2:00 PM Medical Record [id number] Patient Account Number: [id number] Date of Birth/Sex: Treating RN: 03-Jun-1946 (73 y.o. [name], Meta.Reding Primary Care Bryn Perkin: [name] Other Clinician: Referring Keelon Zurn: Treating Fany Cavanaugh/Extender:Robson, [name], [name] in Treatment: 1 Wound Status Wound Number: 5 Primary Venous Leg Ulcer Etiology: Wound Location: Right Lower Leg - Lateral Wound Healed - Epithelialized Wounding Event: Blister Status: Date Acquired: 05/01/2019 Comorbid Sleep Apnea, Congestive Heart Failure, Weeks Of Treatment: 1 History: Hypertension, Type II Diabetes, Neuropathy, Clustered Wound: No Received Chemotherapy Photos Wound Measurements Length: (cm) 0 % Reduct Width: (cm) 0 % Reduct Depth: (cm) 0 Epitheli Area: (cm) 0 Volume: (cm) 0 Wound Description Classification: Full Thickness Without Exposed Support Foul Odo Structures Slough/F Wound Flat and Intact Margin: Exudate Medium Amount: Exudate Exudate Serosanguineous Type: Exudate red, brown Color: Wound Bed Granulation Amount: Large (67-100%) Granulation Quality: Pink Fascia Ex Necrotic Amount: None Present (0%) Fat Layer Tendon Ex Muscle Ex Joint Exp Bone Expo r After Cleansing: No ibrino No Exposed Structure posed: No (Subcutaneous Tissue) Exposed: Yes posed: No posed: No osed: No sed: No ion in Area: 100% ion in Volume: 100% alization: None Electronic Signature(s) Signed: 07/26/2019 6:01:54 PM By: Deon Pilling Signed: 07/29/2019 3:43:33 PM By: Mikeal Hawthorne EMT/HBOT Previous Signature: 07/25/2019 6:06:48 PM Version By: Deon Pilling Entered By: Mikeal Hawthorne on 07/26/2019 08:26:55 -------------------------------------------------------------------------------- Wound Assessment  Details Patient Name: Date of Service: [name] 07/25/2019 2:00 PM Medical Record [id number] Patient Account Number: [id number] Date of Birth/Sex: Treating RN: 10-28-1945 (73 y.o. [name], Meta.Reding Primary Care Kaleiyah Polsky: [name] Other Clinician: Referring Vasil Juhasz: Treating Marten Iles/Extender:Robson, [name], [name] in Treatment: 1 Wound Status Wound Number: 6 Primary Venous Leg Ulcer Etiology: Wound Location: Right Lower Leg - Distal Wound Open Wounding Event: Blister Status: Date Acquired: 05/01/2019 Comorbid Sleep Apnea, Congestive Heart Failure, Weeks Of Treatment: 1 History: Hypertension, Type II Diabetes, Neuropathy, Clustered Wound: No Received Chemotherapy Photos Wound Measurements Length: (cm) 3 % Reducti Width: (cm) 3.9 % Reducti Depth: (cm) 0.1 Epithelia Area: (cm) 9.189 Tunneli Volume: (cm) 0.919 Undermi Wound Description Classification: Full Thickness Without Exposed Support Foul Odo Structures Slough/F Wound Flat and Intact Margin: Exudate Medium Amount: Exudate Serosanguineous Type: Exudate red, brown Color: Wound Bed Granulation Amount: Large (67-100%) Granulation Quality: Red Fascia E Necrotic Amount: None Present (0%) Fat Laye Tendon E Muscle E Joint Ex Bone Exp r After Cleansing: No ibrino No Exposed Structure xposed: No r (Subcutaneous Tissue) Exposed: Yes xposed: No xposed: No posed: No osed: No on  in Area: 25.7% on in Volume: 25.7% lization: Small (1-33%) ng: No ning: No Electronic Signature(s) Signed: 07/26/2019 6:01:54 PM By: Deon Pilling Signed: 07/29/2019 3:43:33 PM By: Mikeal Hawthorne EMT/HBOT Previous Signature: 07/25/2019 6:06:48 PM Version By: Deon Pilling Entered By: Mikeal Hawthorne on 07/26/2019 08:24:59 -------------------------------------------------------------------------------- Wound Assessment Details Patient Name: Date of Service: [name] 07/25/2019 2:00  PM Medical Record [id number] Patient Account Number: [id number] Date of Birth/Sex: Treating RN: 08-20-1946 (73 y.o. [name], Meta.Reding Primary Care Maeven Mcdougall: [name] Other Clinician: Referring Seldon Barrell: Treating Javaya [place]/Extender:Robson, [name], [name] in Treatment: 1 Wound Status Wound Number: 7 Primary Venous Leg Ulcer Etiology: Wound Location: Right Lower Leg - Posterior Wound Open Wounding Event: Blister Status: Date Acquired: 05/01/2019 Comorbid Sleep Apnea, Congestive Heart Failure, Weeks Of Treatment: 1 History: Hypertension, Type II Diabetes, Neuropathy, Clustered Wound: No Received Chemotherapy Photos Wound Measurements Length: (cm) 5 % Reduct Width: (cm) 5.3 % Reduct Depth: (cm) 0.1 Epitheli Area: (cm) 20.813 Tunneli Volume: (cm) 2.081 Undermi Wound Description Classification: Full Thickness Without Exposed Support Foul Odo Structures Slough/F Wound Flat and Intact Margin: Exudate Medium Amount: Exudate Serosanguineous Type: Exudate red, brown Color: Wound Bed Granulation Amount: Large (67-100%) Granulation Quality: Red Fascia E Necrotic Amount: None Present (0%) Fat Laye Tendon E Muscle E Joint Ex Bone Exp r After Cleansing: No ibrino No Exposed Structure xposed: No r (Subcutaneous Tissue) Exposed: Yes xposed: No xposed: No posed: No osed: No ion in Area: 15.9% ion in Volume: 15.9% alization: None ng: No ning: No Electronic Signature(s) Signed: 07/26/2019 6:01:54 PM By: Deon Pilling Signed: 07/29/2019 3:43:33 PM By: Mikeal Hawthorne EMT/HBOT Previous Signature: 07/25/2019 6:06:48 PM Version By: Deon Pilling Entered By: Mikeal Hawthorne on 07/26/2019 08:27:12 -------------------------------------------------------------------------------- Vitals Details Patient Name: Date of Service: [name] 07/25/2019 2:00 PM Medical Record [id number] Patient Account Number: [id number] Date of  Birth/Sex: Treating RN: 1946-09-25 (73 y.o. [name], Tammi Klippel Primary Care Jaxden Blyden: [name] Other Clinician: Referring Buzz Axel: Treating Deette Revak/Extender:Robson, [name], [name] in Treatment: 1 Vital Signs Time Taken: 14:30 Temperature (F): 98.9 Height (in): 77 Pulse (bpm): 55 Weight (lbs): 350 Respiratory Rate (breaths/min): 18 Body Mass Index (BMI): 41.5 Blood Pressure (mmHg): 141/55 Reference Range: 80 - 120 mg / dl Electronic Signature(s) Signed: 09/24/2019 3:02:15 PM By: Sandre Kitty Entered By: Sandre Kitty on 07/25/2019 14:30:34

## 2019-09-24 NOTE — Progress Notes (Signed)
[name], [name] ([id number]) Visit Report for 08/29/2019 Arrival Information Details Patient Name: Date of Service: [name], [name] 08/29/2019 1:30 PM Medical Record [id number] Patient Account Number: [id number] Date of Birth/Sex: Treating RN: 1946-06-30 (73 y.o. [name], Tammi Klippel Primary Care Happy Begeman: [name] Other Clinician: Referring Jeffren Dombek: Treating Lurlean Kernen/Extender:Robson, [name], [name] in Treatment: 6 Visit Information History Since Last Visit Added or deleted any medications: No Patient Arrived: [name] Any new allergies or adverse reactions: No Arrival Time: 13:46 Had a fall or experienced change in No Accompanied By: wife activities of daily living that may affect Transfer Assistance: None risk of falls: Patient Identification Verified: Yes Signs or symptoms of abuse/neglect since last No Secondary Verification Process Yes visito Completed: Hospitalized since last visit: No Patient Requires Transmission- No Implantable device outside of the clinic excluding No Based Precautions: cellular tissue based products placed in the center Patient Has Alerts: Yes since last visit: Patient Alerts: Patient on Blood Has Dressing in Place as Prescribed: Yes Thinner Pain Present Now: No Electronic Signature(s) Signed: 09/24/2019 3:00:22 PM By: Sandre Kitty Entered By: Sandre Kitty on 08/29/2019 13:48:32 -------------------------------------------------------------------------------- Compression Therapy Details Patient Name: Date of Service: [name] 08/29/2019 1:30 PM Medical Record [id number] Patient Account Number: [id number] Date of Birth/Sex: Treating RN: [birth year] (73 y.o. [name], Tammi Klippel Primary Care Lyvonne Cassell: [name] Other Clinician: Referring Lamario Mani: Treating Daleyza Gadomski/Extender:Robson, [name], [name] in Treatment: 6 Compression Therapy Performed for Wound Wound #7  Right,Posterior Lower Leg Assessment: Performed By: Clinician Baruch Gouty, RN Compression Type: Four Layer Pre Treatment ABI: 1.1 Post Procedure Diagnosis Same as Pre-procedure Electronic Signature(s) Signed: 08/29/2019 5:17:30 PM By: Deon Pilling Entered By: Deon Pilling on 08/29/2019 14:27:36 -------------------------------------------------------------------------------- Encounter Discharge Information Details Patient Name: Date of Service: [name] 08/29/2019 1:30 PM Medical Record [id number] Patient Account Number: [id number] Date of Birth/Sex: Treating RN: 11/19/1945 (73 y.o. [name] Primary Care Jenyfer Trawick: [name] Other Clinician: Referring Romero Letizia: Treating Berneita Sanagustin/Extender:Robson, [name], [name] in Treatment: 6 Encounter Discharge Information Items Post Procedure Vitals Discharge Condition: Stable Temperature (F): 98.9 Ambulatory Status: Walker Pulse (bpm): 58 Discharge Destination: Home Respiratory Rate (breaths/min): 18 Transportation: Private Auto Blood Pressure (mmHg): 112/80 Accompanied By: spouse Schedule Follow-up Appointment: Yes Clinical Summary of Care: Patient Declined Electronic Signature(s) Signed: 08/29/2019 5:16:39 PM By: Baruch Gouty RN, BSN Entered By: Baruch Gouty on 08/29/2019 14:51:58 -------------------------------------------------------------------------------- Lower Extremity Assessment Details Patient Name: Date of Service: [name] 08/29/2019 1:30 PM Medical Record [id number] Patient Account Number: [id number] Date of Birth/Sex: Treating RN: 04-Jul-1946 (73 y.o. [name] Primary Care Tomi Grandpre: [name] Other Clinician: Referring Tristin Vandeusen: Treating Iyana Topor/Extender:Robson, [name], [name] in Treatment: 6 Edema Assessment Assessed: [Left: No] [Right: No] Edema: [Left: Ye] [Right: s] Calf Left: Right: Point of Measurement: 34 cm  From Medial Instep cm 42.2 cm Ankle Left: Right: Point of Measurement: 13 cm From Medial Instep cm 26.4 cm Vascular Assessment Pulses: Dorsalis Pedis Palpable: [Right:No] Electronic Signature(s) Signed: 08/29/2019 5:16:39 PM By: Baruch Gouty RN, BSN Entered By: Baruch Gouty on 08/29/2019 14:00:06 -------------------------------------------------------------------------------- Multi Wound Chart Details Patient Name: Date of Service: [name] 08/29/2019 1:30 PM Medical Record [id number] Patient Account Number: [id number] Date of Birth/Sex: Treating RN: September 26, 1946 (73 y.o. [name] Primary Care Colbert Curenton: [name] Other Clinician: Referring Thane Age: Treating Lonnette Shrode/Extender:Robson, [name], [name] in Treatment: 6 Vital Signs Height(in): 77 Pulse(bpm): 35 Weight(lbs): 350 Blood Pressure(mmHg): 112/80 Body Mass  Index(BMI): 41 Temperature(F): 98.9 Respiratory 18 Rate(breaths/min): Photos: [1:No Photos] [7:No Photos] [N/A:N/A] Wound Location: [1:Right Knee - Medial] [7:Right Lower Leg - Posterior N/A] Wounding Event: [1:Trauma] [7:Blister] [N/A:N/A] Primary Etiology: [1:Trauma, Other] [7:Venous Leg Ulcer] [N/A:N/A] Comorbid History: [1:Sleep Apnea, Congestive Sleep Apnea, Congestive N/A Heart Failure, Hypertension, Heart Failure, Hypertension, Type II Diabetes, Neuropathy, Received Chemotherapy] [7:Type II Diabetes, Neuropathy, Received Chemotherapy] Date Acquired: [1:05/01/2019] [7:05/01/2019] [N/A:N/A] Weeks of Treatment: [1:6] [7:6] [N/A:N/A] Wound Status: [1:Open] [7:Open] [N/A:N/A] Measurements L x W x D 0.9x1.5x0.1 [7:0.5x0.7x0.1] [N/A:N/A] (cm) Area (cm) : [1:1.06] [7:0.275] [N/A:N/A] Volume (cm) : [1:0.106] [7:0.027] [N/A:N/A] % Reduction in Area: [1:96.50%] [7:98.90%] [N/A:N/A] % Reduction in Volume: [1:96.50%] [7:98.90%] [N/A:N/A] Classification: [1:Full Thickness Without Exposed Support Structures Exposed Support  Structures] [7:Full Thickness Without] [N/A:N/A] Exudate Amount: [1:Small] [7:Small] [N/A:N/A] Exudate Type: [1:Serosanguineous] [7:Serosanguineous] [N/A:N/A] Exudate Color: [1:red, brown] [7:red, brown] [N/A:N/A] Wound Margin: [1:Flat and Intact] [7:Flat and Intact] [N/A:N/A] Granulation Amount: [1:Large (67-100%)] [7:Large (67-100%)] [N/A:N/A] Granulation Quality: [1:Red] [7:Red] [N/A:N/A] Necrotic Amount: [1:None Present (0%)] [7:None Present (0%)] [N/A:N/A] Exposed Structures: [1:Fat Layer (Subcutaneous Fat Layer (Subcutaneous N/A Tissue) Exposed: Yes Fascia: No Tendon: No Muscle: No Joint: No Bone: No] [7:Tissue) Exposed: Yes Fascia: No Tendon: No Muscle: No Joint: No Bone: No] Epithelialization: [1:Small (1-33%)] [7:Large (67-100%)] [N/A:N/A] Debridement: [1:Debridement - Selective/Open Wound] [7:N/A] [N/A:N/A] Pre-procedure [1:14:24] [7:N/A] [N/A:N/A] Verification/Time Out Taken: Pain Control: [1:Lidocaine 5% topical ointment] [7:N/A] [N/A:N/A] Level: [1:Skin/Epidermis] [7:N/A] [N/A:N/A] Debridement Area (sq cm):0.25 [7:N/A] [N/A:N/A] Instrument: [1:Curette] [7:N/A] [N/A:N/A] Bleeding: [1:Moderate] [7:N/A] [N/A:N/A] Hemostasis Achieved: [1:Pressure] [7:N/A] [N/A:N/A] Procedural Pain: [1:0] [7:N/A] [N/A:N/A] Post Procedural Pain: [1:1] [7:N/A] [N/A:N/A] Debridement Treatment Procedure was tolerated [7:N/A] [N/A:N/A] Response: [1:well] Post Debridement [1:1.5x2x0.1] [7:N/A] [N/A:N/A] Measurements L x W x D (cm) Post Debridement [1:0.236] [7:N/A] [N/A:N/A] Volume: (cm) Procedures Performed: Debridement [7:Compression Therapy] [N/A:N/A] Treatment Notes Wound #1 (Right, Medial Knee) 3. Primary Dressing Applied Collegen AG 4. Secondary Dressing Foam Border Dressing Wound #7 (Right, Posterior Lower Leg) 2. Periwound Care Moisturizing lotion 3. Primary Dressing Applied Collegen AG 4. Secondary Dressing Dry Gauze 6. Support Layer Applied 4 layer compression wrap 7.  Footwear/Offloading device applied Surgical shoe Electronic Signature(s) Signed: 08/29/2019 5:17:30 PM By: Deon Pilling Signed: 08/29/2019 5:47:05 PM By: Linton Ham MD Entered By: Linton Ham on 08/29/2019 15:17:40 -------------------------------------------------------------------------------- Multi-Disciplinary Care Plan Details Patient Name: Date of Service: [name], [name] 08/29/2019 1:30 PM Medical Record [id number] Patient Account Number: [id number] Date of Birth/Sex: Treating RN: April 23, 1946 (73 y.o. [name], Tammi Klippel Primary Care Aailyah Dunbar: [name] Other Clinician: Referring Seriah Brotzman: Treating Wassim Kirksey/Extender:Robson, [name], [name] in Treatment: 6 Active Inactive Nutrition Nursing Diagnoses: Potential for alteratiion in Nutrition/Potential for imbalanced nutrition Goals: Patient/caregiver agrees to and verbalizes understanding of need to obtain nutritional consultation Date Initiated: 07/18/2019 Target Resolution Date: 09/13/2019 Goal Status: Active Interventions: Assess patient nutrition upon admission and as needed per policy Provide education on nutrition Treatment Activities: Education provided on Nutrition : 07/25/2019 Patient referred to Primary Care Physician for further nutritional evaluation : 07/18/2019 Notes: Pain, Acute or Chronic Nursing Diagnoses: Pain, acute or chronic: actual or potential Potential alteration in comfort, pain Goals: Patient will verbalize adequate pain control and receive pain control interventions during procedures as needed Date Initiated: 07/18/2019 Target Resolution Date: 09/13/2019 Goal Status: Active Patient/caregiver will verbalize comfort level met Date Initiated: 07/18/2019 Date Inactivated: 08/08/2019 Target Resolution Date: 08/23/2019 Goal Status: Met Interventions: Encourage patient to take pain medications as prescribed Provide education on pain management Reposition patient for  comfort Treatment Activities:  Administer pain control measures as ordered : 07/18/2019 Notes: Electronic Signature(s) Signed: 08/29/2019 5:17:30 PM By: Deon Pilling Entered By: Deon Pilling on 08/29/2019 14:03:15 -------------------------------------------------------------------------------- Pain Assessment Details Patient Name: Date of Service: [name], [name] 08/29/2019 1:30 PM Medical Record [id number] Patient Account Number: [id number] Date of Birth/Sex: Treating RN: 1946/09/10 (73 y.o. [name], Tammi Klippel Primary Care Suheily Birks: [name] Other Clinician: Referring Aaron Boeh: Treating Uriel Dowding/Extender:Robson, [name], [name] in Treatment: 6 Active Problems Location of Pain Severity and Description of Pain Patient Has Paino No Site Locations Pain Management and Medication Current Pain Management: Electronic Signature(s) Signed: 08/29/2019 5:17:30 PM By: Deon Pilling Signed: 09/24/2019 3:00:22 PM By: Sandre Kitty Entered By: Sandre Kitty on 08/29/2019 13:48:59 -------------------------------------------------------------------------------- Patient/Caregiver Education Details Patient Name: Date of Service: [name] 11/12/2020andnbsp1:30 PM Medical Record [phone] Patient Account Number: [id number] Date of Birth/Gender: Treating RN: Feb 01, 1946 (73 y.o. [name] Primary Care Physician: [name] Other Clinician: Referring Physician: Treating Physician/Extender:Robson, [name], [name] in Treatment: 6 Education Assessment Education Provided To: Patient Education Topics Provided Nutrition: Handouts: Elevated Blood Sugars: How Do They Affect Wound Healing Methods: Explain/Verbal Responses: Reinforcements needed Electronic Signature(s) Signed: 08/29/2019 5:17:30 PM By: Deon Pilling Entered By: Deon Pilling on 08/29/2019  14:03:35 -------------------------------------------------------------------------------- Wound Assessment Details Patient Name: Date of Service: [name], [name] 08/29/2019 1:30 PM Medical Record [id number] Patient Account Number: [id number] Date of Birth/Sex: Treating RN: 1946/03/14 (73 y.o. [name] Primary Care Freddie Dymek: [name] Other Clinician: Referring Rainelle Sulewski: Treating Roe Wilner/Extender:Robson, [name], [name] in Treatment: 6 Wound Status Wound Number: 1 Primary Trauma, Other Etiology: Wound Location: Right Knee - Medial Wound Open Wounding Event: Trauma Status: Date Acquired: 05/01/2019 Comorbid Sleep Apnea, Congestive Heart Failure, Weeks Of Treatment: 6 History: Hypertension, Type II Diabetes, Neuropathy, Clustered Wound: No Received Chemotherapy Photos Wound Measurements Length: (cm) 0.9 % Reduct Width: (cm) 1.5 % Reduct Depth: (cm) 0.1 Epitheli Area: (cm) 1.06 Tunneli Volume: (cm) 0.106 Undermi Wound Description Full Thickness Without Exposed Support Foul Od Classification: Structures Slough/ Wound Flat and Intact Margin: Exudate Small Amount: Exudate Serosanguineous Type: Exudate red, brown Color: Wound Bed Granulation Amount: Large (67-100%) Granulation Quality: Red Fascia Necrotic Amount: None Present (0%) Fat Lay Tendon Muscle Joint E Bone Ex or After Cleansing: No Fibrino Yes Exposed Structure Exposed: No er (Subcutaneous Tissue) Exposed: Yes Exposed: No Exposed: No xposed: No posed: No ion in Area: 96.5% ion in Volume: 96.5% alization: Small (1-33%) ng: No ning: No Electronic Signature(s) Signed: 09/03/2019 4:00:22 PM By: Mikeal Hawthorne EMT/HBOT Signed: 09/04/2019 5:42:21 PM By: Baruch Gouty RN, BSN Previous Signature: 08/29/2019 5:16:39 PM Version By: Baruch Gouty RN, BSN Entered By: Mikeal Hawthorne on 09/02/2019  08:40:28 -------------------------------------------------------------------------------- Wound Assessment Details Patient Name: Date of Service: [name] 08/29/2019 1:30 PM Medical Record [id number] Patient Account Number: [id number] Date of Birth/Sex: Treating RN: 02-10-1946 (73 y.o. [name] Primary Care Carr Shartzer: [name] Other Clinician: Referring Caryl Manas: Treating Abel Ra/Extender:Robson, [name], [name] in Treatment: 6 Wound Status Wound Number: 7 Primary Venous Leg Ulcer Etiology: Wound Location: Right Lower Leg - Posterior Wound Open Wounding Event: Blister Status: Date Acquired: 05/01/2019 Comorbid Sleep Apnea, Congestive Heart Failure, Weeks Of Treatment: 6 History: Hypertension, Type II Diabetes, Neuropathy, Clustered Wound: No Received Chemotherapy Photos Wound Measurements Length: (cm) 0.5 % Reduct Width: (cm) 0.7 % Reduct Depth: (cm) 0.1 Epitheli Area: (cm) 0.275 Tunneli Volume: (cm) 0.027 Undermi Wound Description Classification: Full Thickness Without Exposed Support  Foul Odo Structures Slough/F Wound Flat and Intact Margin: Exudate Small Amount: Exudate Serosanguineous Type: Exudate red, brown Color: Wound Bed Granulation Amount: Large (67-100%) Granulation Quality: Red Fascia E Necrotic Amount: None Present (0%) Fat Laye Tendon E Muscle E Joint Ex Bone Exp r After Cleansing: No ibrino Yes Exposed Structure xposed: No r (Subcutaneous Tissue) Exposed: Yes xposed: No xposed: No posed: No osed: No ion in Area: 98.9% ion in Volume: 98.9% alization: Large (67-100%) ng: No ning: No Electronic Signature(s) Signed: 09/03/2019 4:00:22 PM By: Mikeal Hawthorne EMT/HBOT Signed: 09/04/2019 5:42:21 PM By: Baruch Gouty RN, BSN Previous Signature: 08/29/2019 5:16:39 PM Version By: Baruch Gouty RN, BSN Entered By: Mikeal Hawthorne on 09/02/2019  08:40:45 -------------------------------------------------------------------------------- Vitals Details Patient Name: Date of Service: [name]. 08/29/2019 1:30 PM Medical Record [id number] Patient Account Number: [id number] Date of Birth/Sex: Treating RN: 12-26-1945 (73 y.o. [name], Tammi Klippel Primary Care : [name] Other Clinician: Referring : Treating /Extender:Robson, [name], [name] in Treatment: 6 Vital Signs Time Taken: 13:48 Temperature (F): 98.9 Height (in): 77 Pulse (bpm): 58 Weight (lbs): 350 Respiratory Rate (breaths/min): 18 Body Mass Index (BMI): 41.5 Blood Pressure (mmHg): 112/80 Reference Range: 80 - 120 mg / dl Electronic Signature(s) Signed: 09/24/2019 3:00:22 PM By: Sandre Kitty Entered By: Sandre Kitty on 08/29/2019 13:48:50

## 2019-09-24 NOTE — Progress Notes (Signed)
[name], [name] ([id number]) Visit Report for 07/30/2019 Arrival Information Details Patient Name: Date of Service: [name], [name] 07/30/2019 2:30 PM Medical Record [id number] Patient Account Number: [id number] Date of Birth/Sex: Treating RN: [birth year] (73 y.o. M) Primary Care Zaineb Nowaczyk: [name] Other Clinician: Referring Tikita Mabee: Treating Shaniquia Brafford/Extender:Robson, [name], [name] in Treatment: 1 Visit Information History Since Last Visit Added or deleted any medications: No Patient Arrived: [name] Any new allergies or adverse reactions: No Arrival Time: 15:56 Had a fall or experienced change in No Accompanied By: self activities of daily living that may affect Transfer Assistance: None risk of falls: Patient Identification Verified: Yes Signs or symptoms of abuse/neglect since last No Secondary Verification Process Yes visito Completed: Hospitalized since last visit: No Patient Requires Transmission- No Implantable device outside of the clinic excluding No Based Precautions: cellular tissue based products placed in the center Patient Has Alerts: Yes since last visit: Patient Alerts: Patient on Blood Has Dressing in Place as Prescribed: Yes Thinner Pain Present Now: No Electronic Signature(s) Signed: 09/24/2019 3:02:15 PM By: Sandre Kitty Entered By: Sandre Kitty on 07/30/2019 15:56:40 -------------------------------------------------------------------------------- Clinic Level of Care Assessment Details Patient Name: Date of Service: [name], [name] 07/30/2019 2:30 PM Medical Record [id number] Patient Account Number: [id number] Date of Birth/Sex: Treating RN: 1946/09/14 (73 y.o. [name]) Carlene Coria Primary Care Nazli Penn: [name] Other Clinician: Referring Tuleen Mandelbaum: Treating Shanice Poznanski/Extender:Robson, [name], [name] in Treatment: 1 Clinic Level of Care Assessment Items TOOL 4 Quantity Score X -  Use when only an EandM is performed on FOLLOW-UP visit 1 0 ASSESSMENTS - Nursing Assessment / Reassessment X - Reassessment of Co-morbidities (includes updates in patient status) 1 10 X - Reassessment of Adherence to Treatment Plan 1 5 ASSESSMENTS - Wound and Skin Assessment / Reassessment _0  - Simple Wound Assessment / Reassessment - one wound 0 X - Complex Wound Assessment / Reassessment - multiple wounds 5 5 _1  - Dermatologic / Skin Assessment (not related to wound area) 0 ASSESSMENTS - Focused Assessment _2  - Circumferential Edema Measurements - multi extremities 0 _3  - Nutritional Assessment / Counseling / Intervention 0 _4  - Lower Extremity Assessment (monofilament, tuning fork, pulses) 0 _5  - Peripheral Arterial Disease Assessment (using hand held doppler) 0 ASSESSMENTS - Ostomy and/or Continence Assessment and Care _6  - Incontinence Assessment and Management 0 _7  - Ostomy Care Assessment and Management (repouching, etc.) 0 PROCESS - Coordination of Care X - Simple Patient / Family Education for ongoing care 1 15 _8  - Complex (extensive) Patient / Family Education for ongoing care 0 _9  - Staff obtains Programmer, systems, Records, Test Results / Process Orders 0 _10  - Staff telephones HHA, Nursing Homes / Clarify orders / etc 0 _11  - Routine Transfer to another Facility (non-emergent condition) 0 _12  - Routine Hospital Admission (non-emergent condition) 0 _13  - New Admissions / Biomedical engineer / Ordering NPWT, Apligraf, etc. 0 _14  - Emergency Hospital Admission (emergent condition) 0 X - Simple Discharge Coordination 1 10 _15  - Complex (extensive) Discharge Coordination 0 PROCESS - Special Needs _16  - Pediatric / Minor Patient Management 0 _17  - Isolation Patient Management 0 _18  - Hearing / Language / Visual special needs 0 _19  - Assessment of Community assistance (transportation, D/C planning, etc.) 0 _20  - Additional assistance / Altered mentation 0 _21  - Support Surface(s) Assessment  (bed, cushion, seat, etc.) 0 INTERVENTIONS - Wound Cleansing / Measurement _22  - Simple Wound Cleansing - one wound 0 X - Complex Wound Cleansing - multiple wounds  5 5 X - Wound Imaging (photographs - any number of wounds) 1 5 _0  - Wound Tracing (instead of photographs) 0 _1  - Simple Wound Measurement - one wound 0 X - Complex Wound Measurement - multiple wounds 5 5 INTERVENTIONS - Wound Dressings _2  - Small Wound Dressing one or multiple wounds 0 X - Medium Wound Dressing one or multiple wounds 1 15 X - Large Wound Dressing one or multiple wounds 1 20 X - Application of Medications - topical 1 5 <[id number]>_0<\/[id number]>_9  - Application of Medications - injection 0 INTERVENTIONS - Miscellaneous _4  - External ear exam 0 _5  - Specimen Collection (cultures, biopsies, blood, body fluids, etc.) 0 _6  - Specimen(s) / Culture(s) sent or taken to Lab for analysis 0 _7  - Patient Transfer (multiple staff / Harrel Lemon Lift / Similar devices) 0 _8  - Simple Staple / Suture removal (25 or less) 0 _9  - Complex Staple / Suture removal (26 or more) 0 _10  - Hypo / Hyperglycemic Management (close monitor of Blood Glucose) 0 _11  - Ankle / Brachial Index (ABI) - do not check if billed separately 0 X - Vital Signs 1 5 Has the patient been seen at the hospital within the last three years: Yes Total Score: 165 Level Of Care: New/Established - Level 5 Electronic Signature(s) Signed: 09/24/2019 3:01:15 PM By: Carlene Coria RN Entered By: Carlene Coria on 07/30/2019 16:53:32 -------------------------------------------------------------------------------- Compression Therapy Details Patient Name: Date of Service: JAKOBEE, BRACKINS 07/30/2019 2:30 PM Medical Record [id number] Patient Account Number: [id number] Date of Birth/Sex: Treating RN: [birth year] (73 y.o. [name]) Carlene Coria Primary Care Masaye Gatchalian: [name] Other Clinician: Referring Anissa Abbs: Treating Maleke Feria/Extender:Robson, [name], [name] in Treatment:  1 Compression Therapy Performed for Wound Wound #2 Right,Proximal,Anterior Lower Leg Assessment: Performed By: Clinician Carlene Coria, RN Compression Type: Four Layer Post Procedure Diagnosis Same as Pre-procedure Electronic Signature(s) Signed: 09/24/2019 3:01:15 PM By: Carlene Coria RN Entered By: Carlene Coria on 07/30/2019 16:45:20 -------------------------------------------------------------------------------- Compression Therapy Details Patient Name: Date of Service: SONNY, ANTHES 07/30/2019 2:30 PM Medical Record [id number] Patient Account Number: [id number] Date of Birth/Sex: Treating RN: [birth year] (73 y.o. [name]) Carlene Coria Primary Care Juelz Claar: [name] Other Clinician: Referring Brogan England: Treating Rie Mcneil/Extender:Robson, [name], [name] in Treatment: 1 Compression Therapy Performed for Wound Wound #3 Right,Distal,Anterior Lower Leg Assessment: Performed By: Clinician Carlene Coria, RN Compression Type: Four Layer Post Procedure Diagnosis Same as Pre-procedure Electronic Signature(s) Signed: 09/24/2019 3:01:15 PM By: Carlene Coria RN Entered By: Carlene Coria on 07/30/2019 16:45:20 -------------------------------------------------------------------------------- Compression Therapy Details Patient Name: Date of Service: RAIDER, VALBUENA 07/30/2019 2:30 PM Medical Record [id number] Patient Account Number: [id number] Date of Birth/Sex: Treating RN: 1945-10-21 (73 y.o. [name]) Carlene Coria Primary Care Mercer Stallworth: [name] Other Clinician: Referring Younis Mathey: Treating Luken Shadowens/Extender:Robson, [name], [name] in Treatment: 1 Compression Therapy Performed for Wound Wound #6 Right,Distal Lower Leg Assessment: Performed By: Clinician Carlene Coria, RN Compression Type: Four Layer Post Procedure Diagnosis Same as Pre-procedure Electronic Signature(s) Signed: 09/24/2019 3:01:15 PM By: Carlene Coria RN Entered By: Carlene Coria on 07/30/2019 16:45:20 -------------------------------------------------------------------------------- Compression Therapy Details Patient Name: Date of Service: CORDARIUS, BENNING 07/30/2019 2:30 PM Medical Record [id number] Patient Account Number: [id number] Date of Birth/Sex: Treating RN: 1946/05/28 (73 y.o. Oval Linsey Primary Care Zaliyah Meikle: [name] Other Clinician: Referring Oshea Percival: Treating Tiara Bartoli/Extender:Robson, [name], [name] in Treatment: 1 Compression Therapy Performed for Wound Wound #7 Right,Posterior Lower Leg Assessment: Performed By: Jake Church, RN Compression  Type: Four Layer Post Procedure Diagnosis Same as Pre-procedure Electronic Signature(s) Signed: 09/24/2019 3:01:15 PM By: Carlene Coria RN Entered By: Carlene Coria on 07/30/2019 16:45:20 -------------------------------------------------------------------------------- Encounter Discharge Information Details Patient Name: Date of Service: [name] 07/30/2019 2:30 PM Medical Record [id number] Patient Account Number: [id number] Date of Birth/Sex: Treating RN: 06-Jun-1946 (73 y.o. Janyth Contes Primary Care Deakin Lacek: [name] Other Clinician: Referring Saori Umholtz: Treating Jariana Shumard/Extender:Robson, [name], [name] in Treatment: 1 Encounter Discharge Information Items Discharge Condition: Stable Ambulatory Status: Walker Discharge Destination: Home Transportation: Private Auto Accompanied By: wife Schedule Follow-up Appointment: Yes Clinical Summary of Care: Patient Declined Electronic Signature(s) Signed: 07/30/2019 5:52:09 PM By: Levan Hurst RN, BSN Entered By: Levan Hurst on 07/30/2019 17:49:18 -------------------------------------------------------------------------------- Lower Extremity Assessment Details Patient Name: Date of Service: [name] 07/30/2019 2:30 PM Medical Record  [id number] Patient Account Number: [id number] Date of Birth/Sex: Treating RN: [birth year] (73 y.o. [name] Primary Care Heavenlee Maiorana: [name] Other Clinician: Referring Annalea Alguire: Treating Isadore Bokhari/Extender:Robson, [name], [name] in Treatment: 1 Edema Assessment Assessed: [Left: No] [Right: No] Edema: [Left: Yes] [Right: Yes] Calf Left: Right: Point of Measurement: 34 cm From Medial Instep 51 cm 40 cm Ankle Left: Right: Point of Measurement: 13 cm From Medial Instep 30 cm 27 cm Vascular Assessment Pulses: Dorsalis Pedis Palpable: [Left:Yes] [Right:Yes] Electronic Signature(s) Signed: 07/31/2019 5:37:20 PM By: Kela Millin Entered By: Kela Millin on 07/30/2019 16:18:38 -------------------------------------------------------------------------------- Multi Wound Chart Details Patient Name: Date of Service: [name] 07/30/2019 2:30 PM Medical Record [id number] Patient Account Number: [id number] Date of Birth/Sex: Treating RN: Mar 08, 1946 (73 y.o. M) Primary Care Vincente Asbridge: [name] Other Clinician: Referring Tajae Rybicki: Treating Kahdijah Errickson/Extender:Robson, [name], [name] in Treatment: 1 Vital Signs Height(in): 15 Pulse(bpm): 57 Weight(lbs): 350 Blood Pressure(mmHg): 139/75 Body Mass Index(BMI): 41 Temperature(F): 98.4 Respiratory 18 Rate(breaths/min): Photos: [1:No Photos] [2:No Photos] [3:No Photos] Wound Location: [1:Right Knee - Medial] [2:Right Lower Leg - Anterior, Right Lower Leg - Anterior, Proximal] [3:Distal] Wounding Event: [1:Trauma] [2:Blister] [3:Blister] Primary Etiology: [1:Trauma, Other] [2:Venous Leg Ulcer] [3:Venous Leg Ulcer] Comorbid History: [1:Sleep Apnea, Congestive Sleep Apnea, Congestive Sleep Apnea, Congestive Heart Failure, Hypertension, Heart Failure, Hypertension, Heart Failure, Hypertension, Type II Diabetes, Neuropathy, Received Chemotherapy] [2:Type II Diabetes,   Neuropathy, Received Chemotherapy] [3:Type II Diabetes, Neuropathy, Received Chemotherapy] Date Acquired: [1:05/01/2019] [2:05/01/2019] [3:05/01/2019] Weeks of Treatment: [1:1] [2:1] [3:1] Wound Status: [1:Open] [2:Open] [3:Open] Clustered Wound: [1:No] [2:Yes] [3:No] Clustered Quantity: [1:N/A] [2:1] [3:N/A] Measurements L x W x D 3.1x4x0.1 [2:0.5x0.4x0.1] [3:0x0x0] (cm) Area (cm) : [1:9.739] [2:0.157] [3:0] Volume (cm) : [1:0.974] [2:0.016] [3:0] % Reduction in Area: [1:67.40%] [2:95.40%] [3:100.00%] % Reduction in Volume: 67.40% [2:95.30%] [3:100.00%] Classification: [1:Full Thickness Without Exposed Support Structures Exposed Support Structures Exposed Support Structures] [2:Full Thickness Without] [3:Full Thickness Without] Exudate Amount: [1:Medium] [2:Small] [3:None Present] Exudate Type: [1:Serosanguineous] [2:Serosanguineous] [3:N/A] Exudate Color: [1:red, brown] [2:red, brown] [3:N/A] Wound Margin: [1:Flat and Intact] [2:Flat and Intact] [3:Flat and Intact] Granulation Amount: [1:Medium (34-66%)] [2:Large (67-100%)] [3:None Present (0%)] Granulation Quality: [1:Red, Pink] [2:Red] [3:N/A] Necrotic Amount: [1:Medium (34-66%)] [2:Small (1-33%)] [3:None Present (0%)] Exposed Structures: [1:Fat Layer (Subcutaneous Fat Layer (Subcutaneous Fascia: No Tissue) Exposed: Yes Fascia: No Tendon: No Muscle: No Joint: No Bone: No] [2:Tissue) Exposed: Yes Fascia: No Tendon: No Muscle: No Joint: No Bone: No] [3:Fat Layer (Subcutaneous Tissue)  Exposed: No Tendon: No Muscle: No Joint: No Bone: No] Epithelialization: [1:None] [2:None] [3:Large (67-100%)] Procedures Performed: [1:N/A 6] [2:Compression Therapy] [3:Compression Therapy 7 N/A]  Photos: [1:No Photos] [2:No Photos] [3:N/A] Wound Location: [1:Right Lower Leg - Distal] [2:Right Lower Leg - Posterior N/A] Wounding Event: [1:Blister] [2:Blister] [3:N/A] Primary Etiology: [1:Venous Leg Ulcer] [2:Venous Leg Ulcer] [3:N/A] Comorbid History:  [1:Sleep Apnea, Congestive Sleep Apnea, Congestive N/A Heart Failure, Hypertension, Heart Failure, Hypertension, Type II Diabetes, Neuropathy, Received Chemotherapy] [2:Type II Diabetes, Neuropathy, Received Chemotherapy] Date Acquired: [1:05/01/2019] [2:05/01/2019] [3:N/A] Weeks of Treatment: [1:1] [2:1] [3:N/A] Wound Status: [1:Open] [2:Open] [3:N/A] Clustered Wound: [1:No] [2:No] [3:N/A] Clustered Quantity: [1:N/A] [2:N/A] [3:N/A] Measurements L x W x D 2x2.3x0.1 [2:4.5x5.8x0.1] [3:N/A] (cm) Area (cm) : [1:3.613] [2:20.499] [3:N/A] Volume (cm) : [1:0.361] [2:2.05] [3:N/A] % Reduction in Area: [1:70.80%] [2:17.10%] [3:N/A] % Reduction in Volume: 70.80% [2:17.10%] [3:N/A] Classification: [1:Full Thickness Without Exposed Support Structures Exposed Support Structures] [2:Full Thickness Without] [3:N/A] Exudate Amount: [1:Medium] [2:Medium] [3:N/A] Exudate Type: [1:Serosanguineous] [2:Serosanguineous] [3:N/A] Exudate Color: [1:red, brown] [2:red, brown] [3:N/A] Wound Margin: [1:Flat and Intact] [2:Flat and Intact] [3:N/A] Granulation Amount: [1:Large (67-100%)] [2:Large (67-100%)] [3:N/A] Granulation Quality: [1:Red] [2:Red, Pink] [3:N/A] Necrotic Amount: [1:Small (1-33%)] [2:Small (1-33%)] [3:N/A] Exposed Structures: [1:Fat Layer (Subcutaneous Fat Layer (Subcutaneous N/A Tissue) Exposed: Yes Fascia: No Tendon: No Muscle: No Joint: No Bone: No] [2:Tissue) Exposed: Yes Fascia: No Tendon: No Muscle: No Joint: No Bone: No] Epithelialization: [1:Small (1-33%)] [2:None Compression Therapy] [3:N/A N/A] Treatment Notes Wound #2 (Right, Proximal, Anterior Lower Leg) 1. Cleanse With Wound Cleanser Soap and water 2. Periwound Care Moisturizing lotion TCA Cream 3. Primary Dressing Applied Calcium Alginate Ag 4. Secondary Dressing ABD Pad 6. Support Layer Applied 4 layer compression wrap Notes stretch net Wound #3 (Right, Distal, Anterior Lower Leg) 1. Cleanse With Soap and water 2.  Periwound Care Moisturizing lotion 3. Primary Dressing Applied Calcium Alginate Ag 4. Secondary Dressing ABD Pad 6. Support Layer Applied 4 layer compression Water quality scientist) Signed: 07/30/2019 5:42:33 PM By: Linton Ham MD Entered By: Linton Ham on 07/30/2019 17:25:54 -------------------------------------------------------------------------------- Nectar Details Patient Name: Date of Service: [name] 07/30/2019 2:30 PM Medical Record [id number] Patient Account Number: [id number] Date of Birth/Sex: Treating RN: 09-May-1946 (73 y.o. [name]) Carlene Coria Primary Care Makeba Delcastillo: [name] Other Clinician: Referring Nerissa Constantin: Treating Kierria Feigenbaum/Extender:Robson, [name], [name] in Treatment: 1 Active Inactive Nutrition Nursing Diagnoses: Potential for alteratiion in Nutrition/Potential for imbalanced nutrition Goals: Patient/caregiver agrees to and verbalizes understanding of need to obtain nutritional consultation Date Initiated: 07/18/2019 Target Resolution Date: 08/23/2019 Goal Status: Active Interventions: Assess patient nutrition upon admission and as needed per policy Provide education on nutrition Treatment Activities: Education provided on Nutrition : 07/25/2019 Patient referred to Primary Care Physician for further nutritional evaluation : 07/18/2019 Notes: Pain, Acute or Chronic Nursing Diagnoses: Pain, acute or chronic: actual or potential Potential alteration in comfort, pain Goals: Patient will verbalize adequate pain control and receive pain control interventions during procedures as needed Date Initiated: 07/18/2019 Target Resolution Date: 08/23/2019 Goal Status: Active Patient/caregiver will verbalize comfort level met Date Initiated: 07/18/2019 Target Resolution Date: 08/23/2019 Goal Status: Active Interventions: Encourage patient to take pain medications as prescribed Provide education on  pain management Reposition patient for comfort Treatment Activities: Administer pain control measures as ordered : 07/18/2019 Notes: Electronic Signature(s) Signed: 09/24/2019 3:01:15 PM By: Carlene Coria RN Entered By: Carlene Coria on 07/30/2019 14:33:00 -------------------------------------------------------------------------------- Pain Assessment Details Patient Name: Date of Service: JORY, TANGUMA 07/30/2019 2:30 PM Medical Record [id number] Patient Account Number: [id number] Date of Birth/Sex: Treating RN: 09-26-1946 (73 y.o. M) Primary Care Eren Puebla: [name] Other  Clinician: Referring Ronal Maybury: Treating Jearline Hirschhorn/Extender:Robson, [name], [name] in Treatment: 1 Active Problems Location of Pain Severity and Description of Pain Patient Has Paino No Site Locations Pain Management and Medication Current Pain Management: Electronic Signature(s) Signed: 09/24/2019 3:02:15 PM By: Sandre Kitty Entered By: Sandre Kitty on 07/30/2019 15:57:04 -------------------------------------------------------------------------------- Patient/Caregiver Education Details Patient Name: Date of Service: [name] 10/13/2020andnbsp2:30 PM Medical Record [phone] Patient Account Number: [id number] Date of Birth/Gender: Treating RN: 1945-12-21 (73 y.o. Oval Linsey Primary Care Physician: [name] Other Clinician: Referring Physician: Treating Physician/Extender:Robson, [name], [name] in Treatment: 1 Education Assessment Education Provided To: Patient Education Topics Provided Pain: Methods: Explain/Verbal Responses: State content correctly Electronic Signature(s) Signed: 09/24/2019 3:01:15 PM By: Carlene Coria RN Entered By: Carlene Coria on 07/30/2019 14:33:15 -------------------------------------------------------------------------------- Wound Assessment Details Patient Name: Date of Service: CHESTER, SIBERT 07/30/2019 2:30 PM Medical Record [id number] Patient Account Number: [id number] Date of Birth/Sex: Treating RN: [birth year] (73 y.o. [name] Primary Care Trentin Knappenberger: [name] Other Clinician: Referring Montrell Cessna: Treating Leith Szafranski/Extender:Robson, [name], [name] in Treatment: 1 Wound Status Wound Number: 1 Primary Trauma, Other Etiology: Wound Location: Right Knee - Medial Wound Open Wounding Event: Trauma Status: Date Acquired: 05/01/2019 Comorbid Sleep Apnea, Congestive Heart Failure, Weeks Of Treatment: 1 History: Hypertension, Type II Diabetes, Neuropathy, Clustered Wound: No Received Chemotherapy Photos Wound Measurements Length: (cm) 3.1 % Reduct Width: (cm) 4 % Reduct Depth: (cm) 0.1 Epitheli Area: (cm) 9.739 Tunneli Volume: (cm) 0.974 Undermi Wound Description Classification: Full Thickness Without Exposed Support Structures Wound Flat and Intact Margin: Exudate Medium Amount: Exudate Serosanguineous Type: Exudate red, brown Color: Wound Bed Granulation Amount: Medium (34-66%) Granulation Quality: Red, Pink Necrotic Amount: Medium (34-66%) Necrotic Quality: Adherent Slough Foul Odor After Cleansing: No Slough/Fibrino Yes Exposed Structure Fascia Exposed: No Fat Layer (Subcutaneous Tissue) Exposed: Yes Tendon Exposed: No Muscle Exposed: No Joint Exposed: No Bone Exposed: No ion in Area: 67.4% ion in Volume: 67.4% alization: None ng: No ning: No Electronic Signature(s) Signed: 08/01/2019 6:42:53 PM By: Kela Millin Signed: 08/21/2019 4:01:42 PM By: Mikeal Hawthorne EMT/HBOT Previous Signature: 07/31/2019 5:37:20 PM Version By: Kela Millin Entered By: Mikeal Hawthorne on 08/01/2019 07:50:27 -------------------------------------------------------------------------------- Wound Assessment Details Patient Name: Date of Service: [name] 07/30/2019 2:30 PM Medical Record [id number]  Patient Account Number: [id number] Date of Birth/Sex: Treating RN: [birth year] (73 y.o. [name] Primary Care Marlet Korte: [name] Other Clinician: Referring Andreah Goheen: Treating Wilberto Console/Extender:Robson, [name], [name] in Treatment: 1 Wound Status Wound Number: 2 Primary Venous Leg Ulcer Etiology: Wound Location: Right Lower Leg - Anterior, Proximal Wound Open Status: Wounding Event: Blister Comorbid Sleep Apnea, Congestive Heart Failure, Date Acquired: 05/01/2019 History: Hypertension, Type II Diabetes, Neuropathy, Weeks Of Treatment: 1 Received Chemotherapy Clustered Wound: Yes Photos Wound Measurements Length: (cm) 0.5 % Reduct Width: (cm) 0.4 % Reduct Depth: (cm) 0.1 Epitheli Clustered Quantity: 1 Tunnelin Area: (cm) 0.157 Undermi Volume: (cm) 0.016 Wound Description Classification: Full Thickness Without Exposed Support Foul Od Structures Slough/ Wound Flat and Intact Margin: Exudate Small Amount: Exudate Serosanguineous Type: Exudate red, brown Color: Wound Bed Granulation Amount: Large (67-100%) Granulation Quality: Red Fascia Necrotic Amount: Small (1-33%) Fat Lay Necrotic Quality: Adherent Slough Tendon Muscle Joint E Bone Ex or After Cleansing: No Fibrino Yes Exposed Structure Exposed: No er (Subcutaneous Tissue) Exposed: Yes Exposed: No Exposed: No xposed: No posed: No ion in Area: 95.4% ion in Volume: 95.3% alization: None g: No ning: No  Electronic Signature(s) Signed: 08/01/2019 6:42:53 PM By: Kela Millin Signed: 08/21/2019 4:01:42 PM By: Mikeal Hawthorne EMT/HBOT Previous Signature: 07/31/2019 5:37:20 PM Version By: Kela Millin Entered By: Mikeal Hawthorne on 08/01/2019 07:50:45 -------------------------------------------------------------------------------- Wound Assessment Details Patient Name: Date of Service: [name] 07/30/2019 2:30 PM Medical Record [id number] Patient  Account Number: [id number] Date of Birth/Sex: Treating RN: 1946-01-04 (73 y.o. [name] Primary Care Taniyah Ballow: [name] Other Clinician: Referring Cattaleya Wien: Treating Dillon Livermore/Extender:Robson, [name], [name] in Treatment: 1 Wound Status Wound Number: 3 Primary Venous Leg Ulcer Etiology: Wound Location: Right Lower Leg - Anterior, Distal Wound Open Wounding Event: Blister Status: Date Acquired: 05/01/2019 Comorbid Sleep Apnea, Congestive Heart Failure, Weeks Of Treatment: 1 History: Hypertension, Type II Diabetes, Neuropathy, Clustered Wound: No Received Chemotherapy Photos Wound Measurements Length: (cm) 0 % Reduct Width: (cm) 0 % Reduct Depth: (cm) 0 Epitheli Area: (cm) 0 Tunneli Volume: (cm) 0 Undermi Wound Description Classification: Full Thickness Without Exposed Support Structures Wound Flat and Intact Margin: Exudate None Present Amount: Wound Bed Granulation Amount: None Present (0%) Necrotic Amount: None Present (0%) Foul Odor After Cleansing: No Slough/Fibrino No Exposed Structure Fascia Exposed: No Fat Layer (Subcutaneous Tissue) Exposed: No Tendon Exposed: No Muscle Exposed: No Joint Exposed: No Bone Exposed: No ion in Area: 100% ion in Volume: 100% alization: Large (67-100%) ng: No ning: No Electronic Signature(s) Signed: 08/01/2019 6:42:53 PM By: Kela Millin Signed: 08/21/2019 4:01:42 PM By: Mikeal Hawthorne EMT/HBOT Previous Signature: 07/31/2019 5:37:20 PM Version By: Kela Millin Entered By: Mikeal Hawthorne on 08/01/2019 07:51:24 -------------------------------------------------------------------------------- Wound Assessment Details Patient Name: Date of Service: [name] 07/30/2019 2:30 PM Medical Record [id number] Patient Account Number: [id number] Date of Birth/Sex: Treating RN: 1945-11-22 (73 y.o. [name] Primary Care Dazhane Villagomez: [name] Other  Clinician: Referring Jairus Tonne: Treating Gabryel Files/Extender:Robson, [name], [name] in Treatment: 1 Wound Status Wound Number: 6 Primary Venous Leg Ulcer Etiology: Wound Location: Right Lower Leg - Distal Wound Open Wounding Event: Blister Status: Date Acquired: 05/01/2019 Comorbid Sleep Apnea, Congestive Heart Failure, Weeks Of Treatment: 1 History: Hypertension, Type II Diabetes, Neuropathy, Clustered Wound: No Received Chemotherapy Photos Wound Measurements Length: (cm) 2 % Reduct Width: (cm) 2.3 % Reduct Depth: (cm) 0.1 Epitheli Area: (cm) 3.613 Tunneli Volume: (cm) 0.361 Undermi Wound Description Classification: Full Thickness Without Exposed Support Foul Od Structures Slough/ Wound Flat and Intact Margin: Exudate Medium Amount: Exudate Serosanguineous Type: Exudate red, brown Color: Wound Bed Granulation Amount: Large (67-100%) Granulation Quality: Red Fascia Necrotic Amount: Small (1-33%) Fat Lay Necrotic Quality: Adherent Slough Tendon Muscle Joint E Bone Ex or After Cleansing: No Fibrino Yes Exposed Structure Exposed: No er (Subcutaneous Tissue) Exposed: Yes Exposed: No Exposed: No xposed: No posed: No ion in Area: 70.8% ion in Volume: 70.8% alization: Small (1-33%) ng: No ning: No Electronic Signature(s) Signed: 08/01/2019 6:42:53 PM By: Kela Millin Signed: 08/21/2019 4:01:42 PM By: Mikeal Hawthorne EMT/HBOT Previous Signature: 07/31/2019 5:37:20 PM Version By: Kela Millin Entered By: Mikeal Hawthorne on 08/01/2019 07:51:08 -------------------------------------------------------------------------------- Wound Assessment Details Patient Name: Date of Service: [name] 07/30/2019 2:30 PM Medical Record [id number] Patient Account Number: [id number] Date of Birth/Sex: Treating RN: [birth year] (73 y.o. [name] Primary Care Zymiere Trostle: [name] Other Clinician: Referring Krystian Ferrentino: Treating  Bladyn Tipps/Extender:Robson, [name], [name] in Treatment: 1 Wound Status Wound Number: 7 Primary Venous Leg Ulcer Etiology: Wound Location: Right Lower Leg - Posterior Wound Open Wounding Event: Blister Status: Date Acquired: 05/01/2019 Comorbid Sleep  Apnea, Congestive Heart Failure, Weeks Of Treatment: 1 History: Hypertension, Type II Diabetes, Neuropathy, Clustered Wound: No Received Chemotherapy Photos Wound Measurements Length: (cm) 4.5 % Reduct Width: (cm) 5.8 % Reduct Depth: (cm) 0.1 Epitheli Area: (cm) 20.499 Tunneli Volume: (cm) 2.05 Undermi Wound Description Classification: Full Thickness Without Exposed Support Foul Odo Structures Slough/F Wound Flat and Intact Margin: Exudate Medium Amount: Exudate Serosanguineous Type: Exudate red, brown Color: Wound Bed Granulation Amount: Large (67-100%) Granulation Quality: Red, Pink Fascia E Necrotic Amount: Small (1-33%) Fat Laye Necrotic Quality: Adherent Slough Tendon E Muscle E Joint Ex Bone Exp r After Cleansing: No ibrino Yes Exposed Structure xposed: No r (Subcutaneous Tissue) Exposed: Yes xposed: No xposed: No posed: No osed: No ion in Area: 17.1% ion in Volume: 17.1% alization: None ng: No ning: No Electronic Signature(s) Signed: 08/01/2019 6:42:53 PM By: Kela Millin Signed: 08/21/2019 4:01:42 PM By: Mikeal Hawthorne EMT/HBOT Previous Signature: 07/31/2019 5:37:20 PM Version By: Kela Millin Entered By: Mikeal Hawthorne on 08/01/2019 07:51:43 -------------------------------------------------------------------------------- Vitals Details Patient Name: Date of Service: [name] 07/30/2019 2:30 PM Medical Record [id number] Patient Account Number: [id number] Date of Birth/Sex: Treating RN: 08-28-1946 (73 y.o. M) Primary Care Fritzi Scripter: [name] Other Clinician: Referring Caliya Narine: Treating Shandora Koogler/Extender:Robson, [name], [name] in  Treatment: 1 Vital Signs Time Taken: 15:56 Temperature (F): 98.4 Height (in): 77 Pulse (bpm): 52 Weight (lbs): 350 Respiratory Rate (breaths/min): 18 Body Mass Index (BMI): 41.5 Blood Pressure (mmHg): 139/75 Reference Range: 80 - 120 mg / dl Electronic Signature(s) Signed: 09/24/2019 3:02:15 PM By: Sandre Kitty Entered By: Sandre Kitty on 07/30/2019 15:56:58

## 2019-09-24 NOTE — Progress Notes (Signed)
[name] ([id number]) Visit Report for 08/08/2019 Arrival Information Details Patient Name: Date of Service: [name], [name] 08/08/2019 1:30 PM Medical Record [id number] Patient Account Number: [id number] Date of Birth/Sex: Treating RN: Mar 18, 1946 (73 y.o. [name], Tammi Klippel Primary Care Taeshawn Helfman: [name] Other Clinician: Referring Sacha Radloff: Treating Wallace Gappa/Extender:Robson, [name], [name] in Treatment: 3 Visit Information History Since Last Visit Added or deleted any medications: No Patient Arrived: [name] Any new allergies or adverse reactions: No Arrival Time: 14:09 Had a fall or experienced change in No Accompanied By: self activities of daily living that may affect Transfer Assistance: None risk of falls: Patient Identification Verified: Yes Signs or symptoms of abuse/neglect since last No Secondary Verification Process Yes visito Completed: Hospitalized since last visit: No Patient Requires Transmission- No Implantable device outside of the clinic excluding No Based Precautions: cellular tissue based products placed in the center Patient Has Alerts: Yes since last visit: Patient Alerts: Patient on Blood Has Dressing in Place as Prescribed: Yes Thinner Pain Present Now: No Electronic Signature(s) Signed: 09/24/2019 3:02:15 PM By: Sandre Kitty Entered By: Sandre Kitty on 08/08/2019 14:11:49 -------------------------------------------------------------------------------- Compression Therapy Details Patient Name: Date of Service: [name] 08/08/2019 1:30 PM Medical Record [id number] Patient Account Number: [id number] Date of Birth/Sex: Treating RN: [birth year] (73 y.o. [name], Tammi Klippel Primary Care Deaja Rizo: [name] Other Clinician: Referring Barnes Florek: Treating Gao Mitnick/Extender:Robson, [name], [name] in Treatment: 3 Compression Therapy Performed for Wound Wound #7  Right,Posterior Lower Leg Assessment: Performed By: Clinician Kela Millin, RN Compression Type: Four Layer Pre Treatment ABI: 1.1 Post Procedure Diagnosis Same as Pre-procedure Electronic Signature(s) Signed: 08/08/2019 6:35:09 PM By: Deon Pilling Entered By: Deon Pilling on 08/08/2019 14:57:12 -------------------------------------------------------------------------------- Encounter Discharge Information Details Patient Name: Date of Service: [name] 08/08/2019 1:30 PM Medical Record [id number] Patient Account Number: [id number] Date of Birth/Sex: Treating RN: [birth year] (73 y.o. [name] Primary Care Viridiana Spaid: [name] Other Clinician: Referring Emmalyn Hinson: Treating Tiago Humphrey/Extender:Robson, [name], [name] in Treatment: 3 Encounter Discharge Information Items Discharge Condition: Stable Ambulatory Status: Walker Discharge Destination: Home Transportation: Private Auto Accompanied By: wife Schedule Follow-up Appointment: Yes Clinical Summary of Care: Patient Declined Electronic Signature(s) Signed: 08/12/2019 5:21:50 PM By: Kela Millin Entered By: Kela Millin on 08/08/2019 15:33:44 -------------------------------------------------------------------------------- Lower Extremity Assessment Details Patient Name: Date of Service: [name] 08/08/2019 1:30 PM Medical Record [id number] Patient Account Number: [id number] Date of Birth/Sex: Treating RN: May 08, 1946 (72 y.o. [name] Primary Care Shali Vesey: [name] Other Clinician: Referring Tonni Mansour: Treating Maniah Nading/Extender:Robson, [name], [name] in Treatment: 3 Edema Assessment Assessed: [Left: No] [Right: No] Edema: [Left: Yes] [Right: Yes] Calf Left: Right: Point of Measurement: 34 cm From Medial Instep 39.8 cm 45 cm Ankle Left: Right: Point of Measurement: 13 cm From Medial Instep 25 cm 27.5 cm Vascular  Assessment Pulses: Dorsalis Pedis Palpable: [Left:Yes] [Right:Yes] Electronic Signature(s) Signed: 08/09/2019 5:59:58 PM By: Levan Hurst RN, BSN Entered By: Levan Hurst on 08/08/2019 14:47:33 -------------------------------------------------------------------------------- Multi Wound Chart Details Patient Name: Date of Service: [name] 08/08/2019 1:30 PM Medical Record [id number] Patient Account Number: [id number] Date of Birth/Sex: Treating RN: 05-Apr-1946 (73 y.o. [name] Primary Care Maize Brittingham: [name] Other Clinician: Referring Timathy Newberry: Treating Torrey Ballinas/Extender:Robson, [name], [name] in Treatment: 3 Vital Signs Height(in): 77 Pulse(bpm): 55 Weight(lbs): 350 Blood Pressure(mmHg): 131/68 Body Mass Index(BMI): 41 Temperature(F): 98.1 Respiratory 18 Rate(breaths/min): Photos: [1:No Photos] [2:No Photos] [3:No Photos] Wound Location: [  1:Right Knee - Medial] [2:Right Lower Leg - Anterior, Right Lower Leg - Anterior, Proximal] [3:Distal] Wounding Event: [1:Trauma] [2:Blister] [3:Blister] Primary Etiology: [1:Trauma, Other] [2:Venous Leg Ulcer] [3:Venous Leg Ulcer] Comorbid History: [1:Sleep Apnea, Congestive Sleep Apnea, Congestive Sleep Apnea, Congestive Heart Failure, Hypertension, Heart Failure, Hypertension, Heart Failure, Hypertension, Type II Diabetes, Neuropathy, Received Chemotherapy] [2:Type II Diabetes,  Neuropathy, Received Chemotherapy] [3:Type II Diabetes, Neuropathy, Received Chemotherapy] Date Acquired: [1:05/01/2019] [2:05/01/2019] [3:05/01/2019] Weeks of Treatment: [1:3] [2:3] [3:3] Wound Status: [1:Open] [2:Healed - Epithelialized] [3:Healed - Epithelialized] Clustered Wound: [1:No] [2:Yes] [3:No] Clustered Quantity: [1:N/A] [2:1] [3:N/A] Measurements L x W x D 2.5x3.7x0.1 [2:0x0x0] [3:0x0x0] (cm) Area (cm) : [1:7.265] [2:0] [3:0] Volume (cm) : [1:0.726] [2:0] [3:0] % Reduction in Area: [1:75.70%]  [2:100.00%] [3:100.00%] % Reduction in Volume: [1:75.70%] [2:100.00%] [3:100.00%] Classification: [1:Full Thickness Without Exposed Support Structures Exposed Support Structures Exposed Support Structures] [2:Full Thickness Without] [3:Full Thickness Without] Exudate Amount: [1:Medium] [2:None Present] [3:None Present] Exudate Type: [1:Serosanguineous] [2:N/A] [3:N/A] Exudate Color: [1:red, brown] [2:N/A] [3:N/A] Wound Margin: [1:Flat and Intact] [2:Flat and Intact] [3:Flat and Intact] Granulation Amount: [1:Large (67-100%)] [2:None Present (0%)] [3:None Present (0%)] Granulation Quality: [1:Red, Pink] [2:N/A] [3:N/A] Necrotic Amount: [1:Small (1-33%)] [2:None Present (0%)] [3:None Present (0%)] Exposed Structures: [1:Fat Layer (Subcutaneous Fascia: No Tissue) Exposed: Yes Fascia: No Tendon: No Muscle: No Joint: No Bone: No] [2:Fat Layer (Subcutaneous Fat Layer (Subcutaneous Tissue) Exposed: No Tendon: No Muscle: No Joint: No Bone: No] [3:Fascia: No Tissue)  Exposed: No Tendon: No Muscle: No Joint: No Bone: No] Epithelialization: [1:Small (1-33%)] [2:Large (67-100%)] [3:Large (67-100%)] Procedures Performed: [1:N/A 6] [2:N/A] [3:N/A 7 N/A] Photos: [1:No Photos] [2:No Photos] [3:N/A] Wound Location: [1:Right Lower Leg - Distal] [2:Right Lower Leg - Posterior N/A] Wounding Event: [1:Blister] [2:Blister] [3:N/A] Primary Etiology: [1:Venous Leg Ulcer] [2:Venous Leg Ulcer] [3:N/A] Comorbid History: [1:Sleep Apnea, Congestive Sleep Apnea, Congestive N/A Heart Failure, Hypertension, Heart Failure, Hypertension, Type II Diabetes, Neuropathy, Received Chemotherapy] [2:Type II Diabetes, Neuropathy, Received Chemotherapy] Date Acquired: [1:05/01/2019] [2:05/01/2019] [3:N/A] Weeks of Treatment: [1:3] [2:3] [3:N/A] Wound Status: [1:Healed - Epithelialized] [2:Open] [3:N/A] Clustered Wound: [1:No] [2:No] [3:N/A] Clustered Quantity: [1:N/A] [2:N/A] [3:N/A] Measurements L x W x D 0x0x0 [2:0.6x0.4x0.1]  [3:N/A] (cm) Area (cm) : [1:0] [2:0.188] [3:N/A] Volume (cm) : [1:0] [6:2.831] [3:N/A] % Reduction in Area: [1:100.00%] [2:99.20%] [3:N/A] % Reduction in Volume: 100.00% [2:99.20%] [3:N/A] Classification: [1:Full Thickness Without Exposed Support Structures Exposed Support Structures] [2:Full Thickness Without] [3:N/A] Exudate Amount: [1:None Present] [2:Small] [3:N/A] Exudate Type: [1:N/A] [2:Serosanguineous] [3:N/A] Exudate Color: [1:N/A] [2:red, brown] [3:N/A] Wound Margin: [1:Flat and Intact] [2:Flat and Intact] [3:N/A] Granulation Amount: [1:None Present (0%)] [2:Large (67-100%)] [3:N/A] Granulation Quality: [1:N/A] [2:Red] [3:N/A] Necrotic Amount: [1:None Present (0%)] [2:None Present (0%)] [3:N/A] Exposed Structures: [1:Fascia: No Fat Layer (Subcutaneous Tissue) Exposed: Yes Tissue) Exposed: No Tendon: No Muscle: No Joint: No Bone: No] [2:Fat Layer (Subcutaneous N/A Fascia: No Tendon: No Muscle: No Joint: No Bone: No] Epithelialization: [1:Large (67-100%) N/A] [2:Medium (34-66%) Compression Therapy] [3:N/A N/A] Treatment Notes Electronic Signature(s) Signed: 08/08/2019 6:29:04 PM By: Linton Ham MD Signed: 08/08/2019 6:35:09 PM By: Deon Pilling Entered By: Linton Ham on 08/08/2019 15:08:49 -------------------------------------------------------------------------------- Multi-Disciplinary Care Plan Details Patient Name: Date of Service: [name] 08/08/2019 1:30 PM Medical Record [id number] Patient Account Number: [id number] Date of Birth/Sex: Treating RN: 01/10/1946 (73 y.o. [name] Primary Care : [name] Other Clinician: Referring : Treating /Extender:Robson, [name], [name] in Treatment: 3 Active Inactive Nutrition Nursing Diagnoses: Potential for alteratiion in Nutrition/Potential for  imbalanced nutrition Goals: Patient/caregiver agrees to and verbalizes understanding of need to obtain  nutritional consultation Date Initiated: 07/18/2019 Target Resolution Date: 09/13/2019 Goal Status: Active Interventions: Assess patient nutrition upon admission and as needed per policy Provide education on nutrition Treatment Activities: Education provided on Nutrition : 07/18/2019 Patient referred to Primary Care Physician for further nutritional evaluation : 07/18/2019 Notes: Pain, Acute or Chronic Nursing Diagnoses: Pain, acute or chronic: actual or potential Potential alteration in comfort, pain Goals: Patient will verbalize adequate pain control and receive pain control interventions during procedures as needed Date Initiated: 07/18/2019 Target Resolution Date: 09/13/2019 Goal Status: Active Patient/caregiver will verbalize comfort level met Date Initiated: 07/18/2019 Date Inactivated: 08/08/2019 Target Resolution Date: 08/23/2019 Goal Status: Met Interventions: Encourage patient to take pain medications as prescribed Provide education on pain management Reposition patient for comfort Treatment Activities: Administer pain control measures as ordered : 07/18/2019 Notes: Electronic Signature(s) Signed: 08/08/2019 6:35:09 PM By: Deon Pilling Entered By: Deon Pilling on 08/08/2019 14:44:39 -------------------------------------------------------------------------------- Pain Assessment Details Patient Name: Date of Service: MAKENZIE, VITTORIO 08/08/2019 1:30 PM Medical Record [id number] Patient Account Number: [id number] Date of Birth/Sex: Treating RN: 26-Aug-1946 (73 y.o. [name], Tammi Klippel Primary Care : [name] Other Clinician: Referring : Treating /Extender:Robson, [name], [name] in Treatment: 3 Active Problems Location of Pain Severity and Description of Pain Patient Has Paino No Site Locations Pain Management and Medication Current Pain Management: Electronic Signature(s) Signed: 08/08/2019 6:35:09 PM By: Deon Pilling Signed: 09/24/2019 3:02:15 PM By: Sandre Kitty Entered By: Sandre Kitty on 08/08/2019 14:12:17 -------------------------------------------------------------------------------- Patient/Caregiver Education Details Patient Name: Date of Service: [name] 10/22/2020andnbsp1:30 PM Medical Record [id number] Patient Account Number: [id number] Date of Birth/Gender: Treating RN: 1945/12/15 (73 y.o. [name] Primary Care Physician: [name] Other Clinician: Referring Physician: Treating Physician/Extender:Robson, [name], [name] in Treatment: 3 Education Assessment Education Provided To: Patient Education Topics Provided Pain: Handouts: A Guide to Pain Control Methods: Explain/Verbal Responses: Reinforcements needed Electronic Signature(s) Signed: 08/08/2019 6:35:09 PM By: Deon Pilling Entered By: Deon Pilling on 08/08/2019 14:44:50 -------------------------------------------------------------------------------- Wound Assessment Details Patient Name: Date of Service: GIOVANIE, LEFEBRE 08/08/2019 1:30 PM Medical Record [id number] Patient Account Number: [id number] Date of Birth/Sex: Treating RN: [birth year] (73 y.o. [name], [name] Primary Care : [name] Other Clinician: Referring : Treating /Extender:Robson, [name], [name] in Treatment: 3 Wound Status Wound Number: 1 Primary Trauma, Other Etiology: Wound Location: Right Knee - Medial Wound Open Wounding Event: Trauma Status: Date Acquired: 05/01/2019 Comorbid Sleep Apnea, Congestive Heart Failure, Weeks Of Treatment: 3 History: Hypertension, Type II Diabetes, Neuropathy, Clustered Wound: No Clustered Wound: No Received Chemotherapy Photos Wound Measurements Length: (cm) 2.5 % Reduct Width: (cm) 3.7 % Reduct Depth: (cm) 0.1 Epitheli Area: (cm) 7.265 Tunneli Volume: (cm) 0.726 Undermi Wound  Description Classification: Full Thickness Without Exposed Support Foul Od Structures Slough/ Wound Flat and Intact Margin: Exudate Medium Amount: Exudate Serosanguineous Type: Exudate red, brown Color: Wound Bed Granulation Amount: Large (67-100%) Granulation Quality: Red, Pink Fascia Necrotic Amount: Small (1-33%) Fat Lay Necrotic Quality: Adherent Slough Tendon Muscle Joint E Bone Ex or After Cleansing: No Fibrino Yes Exposed Structure Exposed: No er (Subcutaneous Tissue) Exposed: Yes Exposed: No Exposed: No xposed: No posed: No ion in Area: 75.7% ion in Volume: 75.7% alization: Small (1-33%) ng: No ning: No Electronic Signature(s) Signed: 08/13/2019 3:44:28 PM By: Mikeal Hawthorne EMT/HBOT Signed: 08/13/2019 5:02:12 PM By: Deon Pilling Previous Signature: 08/08/2019 6:35:09 PM  Version By: Deon Pilling Previous Signature: 08/09/2019 5:59:58 PM Version By: Levan Hurst RN, BSN Entered By: Mikeal Hawthorne on 08/13/2019 12:17:54 -------------------------------------------------------------------------------- Wound Assessment Details Patient Name: Date of Service: RAFAEL, SALWAY 08/08/2019 1:30 PM Medical Record [id number] Patient Account Number: [id number] Date of Birth/Sex: Treating RN: 09/29/1946 (73 y.o. [name], [name] Primary Care : Other Clinician: [name] Referring : Treating /Extender:Robson, [name], [name] in Treatment: 3 Wound Status Wound Number: 2 Primary Venous Leg Ulcer Etiology: Wound Location: Right Lower Leg - Anterior, Proximal Wound Healed - Epithelialized Status: Wounding Event: Blister Comorbid Sleep Apnea, Congestive Heart Failure, Date Acquired: 05/01/2019 History: Hypertension, Type II Diabetes, Neuropathy, Weeks Of Treatment: 3 Received Chemotherapy Clustered Wound: Yes Wound Measurements Length: (cm) 0 % Reduct Width: (cm) 0 % Reduct Depth: (cm) 0  Epitheli Clustered Quantity: 1 Tunnelin Area: (cm) 0 Undermi Volume: (cm) 0 Wound Description Full Thickness Without Exposed Support Foul Od Classification: Structures Slough/ Wound Flat and Intact Margin: Exudate None Present Amount: Wound Bed Granulation Amount: None Present (0%) Necrotic Amount: None Present (0%) Fascia Fat Lay Tendon Muscle Joint E Bone Ex or After Cleansing: No Fibrino No Exposed Structure Exposed: No er (Subcutaneous Tissue) Exposed: No Exposed: No Exposed: No xposed: No posed: No ion in Area: 100% ion in Volume: 100% alization: Large (67-100%) g: No ning: No Electronic Signature(s) Signed: 08/08/2019 6:35:09 PM By: Deon Pilling Signed: 08/09/2019 5:59:58 PM By: Levan Hurst RN, BSN Entered By: Levan Hurst on 08/08/2019 14:48:51 -------------------------------------------------------------------------------- Wound Assessment Details Patient Name: Date of Service: [name] 08/08/2019 1:30 PM Medical Record [id number] Patient Account Number: [id number] Date of Birth/Sex: Treating RN: Mar 10, 1946 (73 y.o. [name], [name] Primary Care : [name] Other Clinician: Referring : Treating /Extender:Robson, [name], [name] in Treatment: 3 Wound Status Wound Number: 3 Primary Venous Leg Ulcer Etiology: Wound Location: Right Lower Leg - Anterior, Distal Wound Healed - Epithelialized Wounding Event: Blister Status: Date Acquired: 05/01/2019 Comorbid Sleep Apnea, Congestive Heart Failure, Weeks Of Treatment: 3 History: Hypertension, Type II Diabetes, Neuropathy, Clustered Wound: No Received Chemotherapy Wound Measurements Length: (cm) 0 % Reduct Width: (cm) 0 % Reduct Depth: (cm) 0 Epitheli Area: (cm) 0 Tunneli Volume: (cm) 0 Undermi Wound Description Classification: Full Thickness Without Exposed Support Foul Od Structures Slough/ Wound Flat and  Intact Margin: Exudate None Present Amount: Wound Bed Granulation Amount: None Present (0%) Necrotic Amount: None Present (0%) Fascia Fat Lay Tendon Muscle Joint E Bone Ex or After Cleansing: No Fibrino No Exposed Structure Exposed: No er (Subcutaneous Tissue) Exposed: No Exposed: No Exposed: No xposed: No posed: No ion in Area: 100% ion in Volume: 100% alization: Large (67-100%) ng: No ning: No Electronic Signature(s) Signed: 08/08/2019 6:35:09 PM By: Deon Pilling Signed: 08/09/2019 5:59:58 PM By: Levan Hurst RN, BSN Entered By: Levan Hurst on 08/08/2019 14:49:07 -------------------------------------------------------------------------------- Wound Assessment Details Patient Name: Date of Service: [name] 08/08/2019 1:30 PM Medical Record [id number] Patient Account Number: [id number] Date of Birth/Sex: Treating RN: 1946-01-26 (73 y.o. [name], Tammi Klippel Primary Care : [name] Other Clinician: Referring : Treating /Extender:Robson, [name], [name] in Treatment: 3 Wound Status Wound Number: 6 Primary Venous Leg Ulcer Etiology: Wound Location: Right Lower Leg - Distal Wound Healed - Epithelialized Wounding Event: Blister Status: Status: Date Acquired: 05/01/2019 Comorbid Sleep Apnea, Congestive Heart Failure, Weeks Of Treatment: 3 History: Hypertension, Type II Diabetes, Neuropathy, Clustered Wound: No Received Chemotherapy Wound Measurements Length: (cm) 0 %  Reduct Width: (cm) 0 % Reduct Depth: (cm) 0 Epitheli Area: (cm) 0 Tunneli Volume: (cm) 0 Undermi Wound Description Classification: Full Thickness Without Exposed Support Foul Od Structures Slough/ Wound Flat and Intact Margin: Exudate None Present Amount: Wound Bed Granulation Amount: None Present (0%) Necrotic Amount: None Present (0%) Fascia Fat Lay Tendon Muscle Joint E Bone Ex or After Cleansing: No Fibrino  Yes Exposed Structure Exposed: No er (Subcutaneous Tissue) Exposed: No Exposed: No Exposed: No xposed: No posed: No ion in Area: 100% ion in Volume: 100% alization: Large (67-100%) ng: No ning: No Electronic Signature(s) Signed: 08/08/2019 6:35:09 PM By: Deon Pilling Signed: 08/09/2019 5:59:58 PM By: Levan Hurst RN, BSN Entered By: Levan Hurst on 08/08/2019 14:49:25 -------------------------------------------------------------------------------- Wound Assessment Details Patient Name: Date of Service: [name] 08/08/2019 1:30 PM Medical Record [id number] Patient Account Number: [id number] Date of Birth/Sex: Treating RN: 1946-02-21 (73 y.o. [name], [name] Primary Care : [name] Other Clinician: Referring : Treating /Extender:Robson, [name], [name] in Treatment: 3 Wound Status Wound Number: 7 Primary Venous Leg Ulcer Etiology: Wound Location: Right Lower Leg - Posterior Wound Open Wounding Event: Blister Status: Date Acquired: 05/01/2019 Comorbid Sleep Apnea, Congestive Heart Failure, Weeks Of Treatment: 3 History: Hypertension, Type II Diabetes, Neuropathy, Clustered Wound: No Received Chemotherapy Photos Wound Measurements Length: (cm) 0.6 % Reduct Width: (cm) 0.4 % Reduct Depth: (cm) 0.1 Epitheli Area: (cm) 0.188 Tunneli Volume: (cm) 0.019 Undermi Wound Description Classification: Full Thickness Without Exposed Support Foul Od Structures Slough/ Wound Flat and Intact Margin: Exudate Small Amount: Exudate Serosanguineous Type: Exudate red, brown Color: Wound Bed Granulation Amount: Large (67-100%) Granulation Quality: Red Fascia Necrotic Amount: None Present (0%) Fat Lay Tendon Muscle Joint E Bone Ex or After Cleansing: No Fibrino Yes Exposed Structure Exposed: No er (Subcutaneous Tissue) Exposed: Yes Exposed: No Exposed: No xposed: No posed: No ion in Area: 99.2% ion  in Volume: 99.2% alization: Medium (34-66%) ng: No ning: No Electronic Signature(s) Signed: 08/13/2019 3:44:28 PM By: Mikeal Hawthorne EMT/HBOT Signed: 08/13/2019 5:02:12 PM By: Deon Pilling Previous Signature: 08/08/2019 6:35:09 PM Version By: Deon Pilling Previous Signature: 08/09/2019 5:59:58 PM Version By: Levan Hurst RN, BSN Entered By: Mikeal Hawthorne on 08/13/2019 12:18:16 -------------------------------------------------------------------------------- Vitals Details Patient Name: Date of Service: [name]. 08/08/2019 1:30 PM Medical Record [id number] Patient Account Number: [id number] Date of Birth/Sex: Treating RN: [birth year] (73 y.o. [name], Tammi Klippel Primary Care : [name] Other Clinician: Referring : Treating /Extender:Robson, [name], [name] in Treatment: 3 Vital Signs Time Taken: 14:11 Temperature (F): 98.1 Height (in): 77 Pulse (bpm): 55 Weight (lbs): 350 Respiratory Rate (breaths/min): 18 Body Mass Index (BMI): 41.5 Blood Pressure (mmHg): 131/68 Reference Range: 80 - 120 mg / dl Electronic Signature(s) Signed: 09/24/2019 3:02:15 PM By: Sandre Kitty Entered By: Sandre Kitty on 08/08/2019 14:12:09

## 2019-09-24 NOTE — Progress Notes (Signed)
VERLAND, SPRINKLE ([id number]) Visit Report for 07/30/2019 HPI Details Patient Name: Date of Service: [name], [name] 07/30/2019 2:30 PM Medical Record [id number] Patient Account Number: [id number] Date of Birth/Sex: Treating RN: [birth year] (73 y.o. M) Primary Care Provider: Laurey [name] Other Clinician: Referring Provider: Treating Provider/Extender:[name], [name], [name] in Treatment: 1 History of Present Illness HPI Description: ADMISSION 07/18/2019 Patient is a 73 year old man with type 2 diabetes and also chronic edema. He had a fall in July. Fairly substantial wound on the right medial knee and across the top of the patella. This is actually made some progress since then. He was seen in the ED on 8/18 with cellulitis. He was treated with doxycycline. This got extended I think for as long as 30 days. On 9/15 he was seen by his primary felt to have cellulitis with open wounds. He currently has 7 wounds the remnant of the initial contusion injury on the right medial knee which actually I think is actually quite a bit smaller than it was looking at a picture on his wife's cell phone. He also has 6 is wounds on the right lower extremity which includes 2 on the right anterior 3 on the right lateral and one posteriorly. He has compression stockings but has not been wearing them. He says these latter 6 wounds started as blisters that open. He has been using Neosporin. 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 He also has a laceration wound on the lower medial left  thigh just above the knee. We have been using Santyl on the latter, silver alginate on the wounds on the right. The patient does not have an open wound on the left and he has a lot less edema. On the left side. We have given him the number for elastic therapy in [place] for 20/30 stockings on the left leg. I am not certain that that will suffice on the right however 10/13; we received an urgent call from home health this morning reporting that the patient's right leg had a dusky color. We brought him into the clinic for review. He has severe bilateral right greater than left chronic venous insufficiency with secondary lymphedema and has discoloration on the right leg chronically related to this. He also reports he wrapped his upper leg with an Ace wrap and may have [name] this with excessive tension Electronic Signature(s) Signed: 07/30/2019 5:42:33 PM By: [name] Ham MD Entered By: [name] on 07/30/2019 17:27:18 -------------------------------------------------------------------------------- Physical Exam Details Patient Name: Date of Service: [name] 07/30/2019 2:30 PM Medical Record [id number] Patient Account Number: [id number] Date of Birth/Sex: Treating RN: 02/03/1946 (73 y.o. M) Primary Care Provider: Laurey [name] Other Clinician: Referring Provider: Treating Provider/Extender:[name], [name], [name] in Treatment: 1 Constitutional Sitting or standing Blood Pressure is within target range for patient.. Pulse regular and within target range for patient.Marland Kitchen Respirations regular, non-labored and within target range.. Temperature is normal and within the target range for the patient.Marland Kitchen Appears in no distress. Respiratory work of breathing is normal. Cardiovascular Pedal pulses look satisfactory. Good edema control. Notes Wound exam; the area on the right medial knee really looks quite  healthy. We have been using Santyl on this. On the right  leg anteriorly laterally and posteriorly he still has superficial wounds that look really quite good. There are rims of epithelialization. The edema control is good Electronic Signature(s) Signed: 07/30/2019 5:42:33 PM By: [name] Ham MD Entered By: [name] on 07/30/2019 17:28:44 -------------------------------------------------------------------------------- Physician Orders Details Patient Name: Date of Service: [name] 07/30/2019 2:30 PM Medical Record [id number] Patient Account Number: [id number] Date of Birth/Sex: Treating RN: 1946-08-01 (73 y.o. [name]) [name] Primary Care Provider: Laurey [name] Other Clinician: Referring Provider: Treating Provider/Extender:Dellis [name], [name], [name] in Treatment: 1 Verbal / Phone Orders: No Diagnosis Coding ICD-10 Coding Code Description I89.0 Lymphedema, not elsewhere classified S81.011D Laceration without foreign body, right knee, subsequent encounter L97.812 Non-pressure chronic ulcer of other part of right lower leg with fat layer exposed L97.811 Non-pressure chronic ulcer of other part of right lower leg limited to breakdown of skin I87.323 Chronic venous hypertension (idiopathic) with inflammation of bilateral lower extremity E11.622 Type 2 diabetes mellitus with other skin ulcer Follow-up Appointments Return Appointment in 1 week. Dressing Change Frequency Wound #1 Right,Medial Knee Change dressing every day. - home health to change once a week. wound center to change on Thursdays. Patient to change all other days. Home Health to ensure patient has secondary dressings. Wound #2 Right,Proximal,Anterior Lower Leg Other: - home health to change once a week. wound center to change on Thursdays. Wound #3 Right,Distal,Anterior Lower Leg Other: - home health to change once a week. wound center to change on Thursdays. Wound #6 Right,Distal Lower Leg Other: - home health to change once a week.  wound center to change on Thursdays. Wound #7 Right,Posterior Lower Leg Other: - home health to change once a week. wound center to change on Thursdays. Skin Barriers/Peri-Wound Care Moisturizing lotion - lotion to both legs. Wound Cleansing Wound #1 Right,Medial Knee Other: - may wash with soap and water. Wound #2 Right,Proximal,Anterior Lower Leg May shower with protection. - home health may wash legs with soap and water when dressing changes. May shower with protection. - home health may wash legs with soap and water when dressing changes. Wound #3 Right,Distal,Anterior Lower Leg May shower with protection. - home health may wash legs with soap and water when dressing changes. Wound #6 Right,Distal Lower Leg May shower with protection. - home health may wash legs with soap and water when dressing changes. Wound #7 Right,Posterior Lower Leg May shower with protection. - home health may wash legs with soap and water when dressing changes. Primary Wound Dressing Wound #1 Right,Medial Knee Calcium Alginate with Silver Wound #2 Right,Proximal,Anterior Lower Leg Calcium Alginate with Silver Wound #3 Right,Distal,Anterior Lower Leg Calcium Alginate with Silver Wound #6 Right,Distal Lower Leg Calcium Alginate with Silver Wound #7 Right,Posterior Lower Leg Calcium Alginate with Silver Secondary Dressing Wound #1 Right,Medial Knee Kerlix/Rolled Gauze - netting to secure dressing. ABD pad Wound #2 Right,Proximal,Anterior Lower Leg ABD pad Kerramax - or super absorptive pad. Wound #3 Right,Distal,Anterior Lower Leg ABD pad Kerramax - or super absorptive pad. Wound #6 Right,Distal Lower Leg ABD pad Kerramax - or super absorptive pad. Wound #7 Right,Posterior Lower Leg ABD pad Kerramax - or super absorptive pad. Edema Control 4 layer compression - Bilateral Avoid standing for long periods of time Elevate legs to the level of the heart or above for 30 minutes daily and/or when  sitting, a frequency of: - throughout the day. Exercise regularly Support Garment 20-30 mm/Hg pressure to: -  patient to call [facility] to purchase compression stockings for left leg. Bring in stockings next week for wound center to apply. [facility] for wound care. [facility] to change once a week. wound center to change on Thursdays. Electronic Signature(s) Signed: 07/30/2019 5:42:33 PM By: [name] Ham MD Signed: 09/24/2019 3:01:15 PM By: [name] Coria RN Entered By: [name] on 07/30/2019 16:52:05 -------------------------------------------------------------------------------- Problem List Details Patient Name: Date of Service: [name] 07/30/2019 2:30 PM Medical Record [id number] Patient Account Number: [id number] Date of Birth/Sex: Treating RN: 11-15-1945 (73 y.o. [name]) Dolores Lory, Morey Hummingbird Primary Care Provider: Laurey [name] Other Clinician: Referring Provider: Treating Provider/Extender:Loie Jahr, [name], [name] in Treatment: 1 Active Problems ICD-10 Evaluated Encounter Code Description Active Date Today Diagnosis I89.0 Lymphedema, not elsewhere classified 07/18/2019 No Yes S81.011D Laceration without foreign body, right knee, 07/18/2019 No Yes subsequent encounter L97.812 Non-pressure chronic ulcer of other part of right lower 07/18/2019 No Yes leg with fat layer exposed L97.811 Non-pressure chronic ulcer of other part of right lower 07/18/2019 No Yes leg limited to breakdown of skin I87.323 Chronic venous hypertension (idiopathic) with 07/18/2019 No Yes inflammation of bilateral lower extremity E11.622 Type 2 diabetes mellitus with other skin ulcer 07/18/2019 No Yes Inactive Problems Resolved Problems Electronic Signature(s) Signed: 07/30/2019 5:42:33 PM By: [name] Ham MD Entered By: [name] on 07/30/2019  17:25:35 -------------------------------------------------------------------------------- Progress Note Details Patient Name: Date of Service: [name] 07/30/2019 2:30 PM Medical Record [id number] Patient Account Number: [id number] Date of Birth/Sex: Treating RN: 1946-10-16 (73 y.o. M) Primary Care Provider: Laurey [name] Other Clinician: Referring Provider: Treating Provider/Extender:Douglass Dunshee, [name], [name] in Treatment: 1 Subjective History of Present Illness (HPI) ADMISSION 07/18/2019 Patient is a 73 year old man with type 2 diabetes and also chronic edema. He had a fall in July. Fairly substantial wound on the right medial knee and across the top of the patella. This is actually made some progress since then. He was seen in the ED on 8/18 with cellulitis. He was treated with doxycycline. This got extended I think for as long as 30 days. On 9/15 he was seen by his primary felt to have cellulitis with open wounds. He currently has 7 wounds the remnant of the initial contusion injury on the right medial knee which actually I think is actually quite a bit smaller than it was looking at a picture on his wife's cell phone. He also has 6 is wounds on the right lower extremity which includes 2 on the right anterior 3 on the right lateral and one posteriorly. He has compression stockings but has not been wearing them. He says these latter 6 wounds started as blisters that open. He has been using Neosporin. 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 He also has a  laceration wound on the lower medial left thigh just above the knee. We have been using Santyl on the latter, silver alginate on the wounds on the right. The patient does not have an open wound on the left and he has a lot less edema. On the left side. We have given him the number  for elastic therapy in [place] for 20/30 stockings on the left leg. I am not certain that that will suffice on the right however 10/13; we received an urgent call from home health this morning reporting that the patient's right leg had a dusky color. We brought him into the clinic for review. He has severe bilateral right greater than left chronic venous insufficiency with secondary lymphedema and has discoloration on the right leg chronically related to this. He also reports he wrapped his upper leg with an Ace wrap and may have [name] this with excessive tension Objective Constitutional Sitting or standing Blood Pressure is within target range for patient.. Pulse regular and within target range for patient.Marland Kitchen Respirations regular, non-labored and within target range.. Temperature is normal and within the target range for the patient.Marland Kitchen Appears in no distress. Vitals Time Taken: 3:56 PM, Height: 77 in, Weight: 350 lbs, BMI: 41.5, Temperature: 98.4 F, Pulse: 52 bpm, Respiratory Rate: 18 breaths/min, Blood Pressure: 139/75 mmHg. Respiratory work of breathing is normal. Cardiovascular Pedal pulses look satisfactory. Good edema control. General Notes: Wound exam; the area on the right medial knee really looks quite healthy. We have been using Santyl on this. On the right leg anteriorly laterally and posteriorly he still has superficial wounds that look really quite good. There are rims of epithelialization. The edema control is good Integumentary (Hair, Skin) Wound #1 status is Open. Original cause of wound was Trauma. The wound is located on the Right,Medial Knee. The wound measures 3.1cm length x 4cm width x 0.1cm  depth; 9.739cm^2 area and 0.974cm^3 volume. There is Fat Layer (Subcutaneous Tissue) Exposed exposed. There is no tunneling or undermining noted. There is a medium amount of serosanguineous drainage noted. The wound margin is flat and intact. There is medium (34-66%) red, pink granulation within the wound bed. There is a medium (34-66%) amount of necrotic tissue within the wound bed including Adherent Slough. Wound #2 status is Open. Original cause of wound was Blister. The wound is located on the Right,Proximal,Anterior Lower Leg. The wound measures 0.5cm length x 0.4cm width x 0.1cm depth; 0.157cm^2 area and 0.016cm^3 volume. There is Fat Layer (Subcutaneous Tissue) Exposed exposed. There is no tunneling or undermining noted. There is a small amount of serosanguineous drainage noted. The wound margin is flat and intact. There is large (67-100%) red granulation within the wound bed. There is a small (1-33%) amount of necrotic tissue within the wound bed including Adherent Slough. Wound #3 status is Open. Original cause of wound was Blister. The wound is located on the Right,Distal,Anterior Lower Leg. The wound measures 0cm length x 0cm width x 0cm depth; 0cm^2 area and 0cm^3 volume. There is no tunneling or undermining noted. There is a none present amount of drainage noted. The wound margin is flat and intact. There is no granulation within the wound bed. There is no necrotic tissue within the wound bed. Wound #6 status is Open. Original cause of wound was Blister. The wound is located on the Right,Distal Lower Leg. The wound measures 2cm length x 2.3cm width x 0.1cm depth; 3.613cm^2 area and 0.361cm^3 volume. There is Fat Layer (Subcutaneous Tissue) Exposed exposed. There is no tunneling or undermining noted. There is a medium amount of serosanguineous drainage noted. The wound margin is flat and intact. There is large (67-100%) red granulation within the wound bed. There is a small (1-33%)  amount of necrotic tissue within the wound bed including Adherent Slough. Wound #7 status is Open. Original cause of  wound was Blister. The wound is located on the Right,Posterior Lower Leg. The wound measures 4.5cm length x 5.8cm width x 0.1cm depth; 20.499cm^2 area and 2.05cm^3 volume. There is Fat Layer (Subcutaneous Tissue) Exposed exposed. There is no tunneling or undermining noted. There is a medium amount of serosanguineous drainage noted. The wound margin is flat and intact. There is large (67-100%) red, pink granulation within the wound bed. There is a small (1-33%) amount of necrotic tissue within the wound bed including Adherent Slough. Assessment Active Problems ICD-10 Lymphedema, not elsewhere classified Laceration without foreign body, right knee, subsequent encounter Non-pressure chronic ulcer of other part of right lower leg with fat layer exposed Non-pressure chronic ulcer of other part of right lower leg limited to breakdown of skin Chronic venous hypertension (idiopathic) with inflammation of bilateral lower extremity Type 2 diabetes mellitus with other skin ulcer Procedures Wound #2 Pre-procedure diagnosis of Wound #2 is a Venous Leg Ulcer located on the Right,Proximal,Anterior Lower Leg . There was a Four Layer Compression Therapy Procedure by [name] Coria, RN. Post procedure Diagnosis Wound #2: Same as Pre-Procedure Wound #3 Pre-procedure diagnosis of Wound #3 is a Venous Leg Ulcer located on the Right,Distal,Anterior Lower Leg . There was a Four Layer Compression Therapy Procedure by [name] Coria, RN. Post procedure Diagnosis Wound #3: Same as Pre-Procedure Wound #6 Pre-procedure diagnosis of Wound #6 is a Venous Leg Ulcer located on the Right,Distal Lower Leg . There was a Four Layer Compression Therapy Procedure by [name] Coria, RN. Post procedure Diagnosis Wound #6: Same as Pre-Procedure Wound #7 Pre-procedure diagnosis of Wound #7 is a Venous Leg Ulcer  located on the Right,Posterior Lower Leg . There was a Four Layer Compression Therapy Procedure by [name] Coria, RN. Post procedure Diagnosis Wound #7: Same as Pre-Procedure Plan Follow-up Appointments: Return Appointment in 1 week. Dressing Change Frequency: Wound #1 Right,Medial Knee: Change dressing every day. - home health to change once a week. wound center to change on Thursdays. Patient to change all other days. Home Health to ensure patient has secondary dressings. Wound #2 Right,Proximal,Anterior Lower Leg: Other: - home health to change once a week. wound center to change on Thursdays. Wound #3 Right,Distal,Anterior Lower Leg: Other: - home health to change once a week. wound center to change on Thursdays. Wound #6 Right,Distal Lower Leg: Other: - home health to change once a week. wound center to change on Thursdays. Wound #7 Right,Posterior Lower Leg: Other: - home health to change once a week. wound center to change on Thursdays. Skin Barriers/Peri-Wound Care: Moisturizing lotion - lotion to both legs. Wound Cleansing: Wound #1 Right,Medial Knee: Other: - may wash with soap and water. Wound #2 Right,Proximal,Anterior Lower Leg: May shower with protection. - home health may wash legs with soap and water when dressing changes. May shower with protection. - home health may wash legs with soap and water when dressing changes. Wound #3 Right,Distal,Anterior Lower Leg: May shower with protection. - home health may wash legs with soap and water when dressing changes. Wound #6 Right,Distal Lower Leg: May shower with protection. - home health may wash legs with soap and water when dressing changes. Wound #7 Right,Posterior Lower Leg: May shower with protection. - home health may wash legs with soap and water when dressing changes. Primary Wound Dressing: Wound #1 Right,Medial Knee: Calcium Alginate with Silver Wound #2 Right,Proximal,Anterior Lower Leg: Calcium Alginate with  Silver Wound #3 Right,Distal,Anterior Lower Leg: Calcium Alginate with Silver Wound #6 Right,Distal Lower Leg: Calcium Alginate  with Silver Wound #7 Right,Posterior Lower Leg: Calcium Alginate with Silver Secondary Dressing: Wound #1 Right,Medial Knee: Kerlix/Rolled Gauze - netting to secure dressing. ABD pad Wound #2 Right,Proximal,Anterior Lower Leg: ABD pad Kerramax - or super absorptive pad. Wound #3 Right,Distal,Anterior Lower Leg: ABD pad Kerramax - or super absorptive pad. Wound #6 Right,Distal Lower Leg: ABD pad Kerramax - or super absorptive pad. Wound #7 Right,Posterior Lower Leg: ABD pad Kerramax - or super absorptive pad. Edema Control: 4 layer compression - Bilateral Avoid standing for long periods of time Elevate legs to the level of the heart or above for 30 minutes daily and/or when sitting, a frequency of: - throughout the day. Exercise regularly Support Garment 20-30 mm/Hg pressure to: - patient to call [facility] to purchase compression stockings for left leg. Bring in stockings next week for wound center to apply. Home Health: [facility] for wound care. [facility] to change once a week. wound center to change on Thursdays. 1. We changed to silver alginate to all the wounds including the knee. 2. In the knee this can be changed daily with border foam 3. We have been using 4 layer compression bilaterally. 4. They have ordered compression stockings from elastic therapy in [place] 5. The patient was brought in urgently to the clinic there is absolutely no urgent issue here. It is possible he used an Ace wrap on his knee wound with excessive tension. There is no infection Electronic Signature(s) Signed: 07/30/2019 5:42:33 PM By: [name] Ham MD Entered By: [name] on 07/30/2019 17:30:35 -------------------------------------------------------------------------------- SuperBill Details Patient Name: Date  of Service: [name] 07/30/2019 Medical Record [id number] Patient Account Number: [id number] Date of Birth/Sex: Treating RN: July 30, 1946 (73 y.o. [name]) [name] Primary Care Provider: Laurey [name] Other Clinician: Referring Provider: Treating Provider/Extender:Rafaela Dinius, [name], [name] in Treatment: 1 Diagnosis Coding ICD-10 Codes Code Description I89.0 Lymphedema, not elsewhere classified S81.011D Laceration without foreign body, right knee, subsequent encounter L97.812 Non-pressure chronic ulcer of other part of right lower leg with fat layer exposed L97.811 Non-pressure chronic ulcer of other part of right lower leg limited to breakdown of skin I87.323 Chronic venous hypertension (idiopathic) with inflammation of bilateral lower extremity E11.622 Type 2 diabetes mellitus with other skin ulcer Facility Procedures The patient participates with Medicare or their insurance follows the Medicare Facility Guidelines: CPT4 Description Modifier Quantity Code [id number] - WOUND CARE VISIT-LEV 5 EST PT 1 [id number] BILATERAL: Application of multi-layer venous compression system; leg 1 (below knee), including ankle and foot. Physician Procedures CPT4 Code Description: [id number] - WC PHYS LEVEL 3 - EST PT ICD-10 Diagnosis Description K74.259 Non-pressure chronic ulcer of other part of right lower l I87.323 Chronic venous hypertension (idiopathic) with inflammatio extremity L97.811  Non-pressure chronic ulcer of other part of right lower l skin I89.0 Lymphedema, not elsewhere classified Modifier: eg with fat lay n of bilateral eg limited to b Quantity: 1 er exposed lower reakdown of Electronic Signature(s) Signed: 07/30/2019 5:42:33 PM By: [name] Ham MD Entered By: [name] on 07/30/2019 17:31:02

## 2019-09-25 NOTE — Progress Notes (Signed)
GEVIN, PEREA ([id number]) Visit Report for 09/19/2019 Arrival Information Details Patient Name: Date of Service: [name], [name] 09/19/2019 12:30 PM Medical Record [id number] Patient Account Number: [id number] Date of Birth/Sex: Treating RN: 07-May-1946 (73 y.o. M) Primary Care Delaynee Alred: [name] Other Clinician: Referring Jaretssi Kraker: Treating Tauriel Scronce/Extender:Robson, [name], [name] in Treatment: 9 Visit Information History Since Last Visit Added or deleted any medications: No Patient Arrived: Ambulatory Any new allergies or adverse reactions: No Arrival Time: 13:18 Had a fall or experienced change in No Accompanied By: self activities of daily living that may affect Transfer Assistance: None risk of falls: Patient Requires Transmission- No Signs or symptoms of abuse/neglect since last No Based Precautions: visito Patient Has Alerts: Yes Hospitalized since last visit: No Patient Alerts: Patient on Blood Implantable device outside of the clinic excluding No Thinner cellular tissue based products placed in the center since last visit: Has Dressing in Place as Prescribed: Yes Pain Present Now: No Electronic Signature(s) Signed: 09/24/2019 2:59:21 PM By: Sandre Kitty Entered By: Sandre Kitty on 09/19/2019 13:20:16 -------------------------------------------------------------------------------- Compression Therapy Details Patient Name: Date of Service: [name], [name] 09/19/2019 12:30 PM Medical Record [id number] Patient Account Number: [id number] Date of Birth/Sex: Treating RN: [birth year] (73 y.o. [name], Tammi Klippel Primary Care Perle Gibbon: [name] Other Clinician: Referring Ayza Ripoll: Treating Analiya Porco/Extender:Robson, [name], [name] in Treatment: 9 Compression Therapy Performed for Wound Wound #9 Right,Lateral Lower Leg Assessment: Performed By: Clinician Baruch Gouty, RN Compression Type: Four  Layer Pre Treatment ABI: 1.1 Post Procedure Diagnosis Same as Pre-procedure Electronic Signature(s) Signed: 09/19/2019 6:37:14 PM By: Deon Pilling Entered By: Deon Pilling on 09/19/2019 14:03:41 -------------------------------------------------------------------------------- Encounter Discharge Information Details Patient Name: Date of Service: [name] 09/19/2019 12:30 PM Medical Record [id number] Patient Account Number: [id number] Date of Birth/Sex: Treating RN: May 06, 1946 (73 y.o. Ernestene Mention Primary Care Jyquan Kenley: [name] Other Clinician: Referring Tarry Fountain: Treating Raissa Dam/Extender:Robson, [name], [name] in Treatment: 9 Encounter Discharge Information Items Discharge Condition: Stable Ambulatory Status: Walker Discharge Destination: Home Transportation: Private Auto Accompanied By: spouse Schedule Follow-up Appointment: Yes Clinical Summary of Care: Patient Declined Electronic Signature(s) Signed: 09/19/2019 5:56:19 PM By: Baruch Gouty RN, BSN Entered By: Baruch Gouty on 09/19/2019 14:50:10 -------------------------------------------------------------------------------- Lower Extremity Assessment Details Patient Name: Date of Service: [name], [name] 09/19/2019 12:30 PM Medical Record [id number] Patient Account Number: [id number] Date of Birth/Sex: Treating RN: [birth year] (73 y.o. [name] Primary Care Mirella Gueye: [name] Other Clinician: Referring Athens Lebeau: Treating Lashaundra Lehrmann/Extender:Robson, [name], [name] in Treatment: 9 Edema Assessment Assessed: [Left: No] [Right: No] Edema: [Left: Ye] [Right: s] Calf Left: Right: Point of Measurement: 34 cm From Medial Instep cm 41 cm Ankle Left: Right: Point of Measurement: 13 cm From Medial Instep cm 26.5 cm Vascular Assessment Pulses: Dorsalis Pedis Palpable: [Right:Yes] Electronic Signature(s) Signed: 09/25/2019 12:04:56 PM By:  Kela Millin Entered By: Kela Millin on 09/19/2019 13:43:42 -------------------------------------------------------------------------------- Multi Wound Chart Details Patient Name: Date of Service: [name] 09/19/2019 12:30 PM Medical Record [id number] Patient Account Number: [id number] Date of Birth/Sex: Treating RN: 10/12/1946 (73 y.o. 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] Comorbid History: [1:Sleep Apnea, Congestive Sleep Apnea, Congestive Sleep Apnea, Congestive Heart Failure, Hypertension, Heart Failure, Hypertension, Heart Failure, Hypertension, Type II Diabetes, Neuropathy, Received Chemotherapy] [7:Type II Diabetes,  Neuropathy, Received Chemotherapy] [8:Type II Diabetes, Neuropathy, Received Chemotherapy] Date Acquired: [1:05/01/2019] [7:05/01/2019] [8:09/19/2019] Weeks of Treatment: [1:9] [7:9] [8:0] Wound Status: [1:Open] [7:Open] [8:Open] Measurements L x W x D 0.5x0.5x0.1 [7:0x0x0] [8:3x2x0.1] (cm) Area (cm) : [1:0.196] [7:0] [8:4.712] Volume (cm) : [1:0.02] [7:0] [8:0.471] % Reduction in Area: [1:99.30%] [7:100.00%] [8:0.00%] % Reduction in Volume: [1:99.30%] [7:100.00%] [8:0.00%] Classification: [1:Full Thickness Without Exposed Support Structures Exposed Support Structures] [7:Full Thickness Without] [8:Grade 2] Exudate Amount: [1:Small] [7:None Present]  [8:Small] Exudate Type: [1:Serosanguineous] [7:N/A] [8:Serosanguineous] Exudate Color: [1:red, brown] [7:N/A] [8:red, brown] Wound Margin: [1:Flat and Intact] [7:Flat and Intact] [8:Distinct, outline attached] Granulation Amount: [1:Large (67-100%)] [7:None Present (0%)] [8:Large (67-100%)] Granulation Quality: [1:Red] [7:N/A] [8:Pink] Necrotic Amount: [1:None Present (0%)] [7:None Present (0%)] [8:N/A] Exposed Structures: [1:Fat Layer (Subcutaneous Fascia: No Tissue) Exposed: Yes Fascia: No Tendon: No Muscle: No Joint: No Bone: No] [7:Fat Layer (Subcutaneous Tissue) Exposed: Yes Tissue) Exposed: No Tendon: No Muscle: No Joint: No Bone: No] [8:Fat Layer (Subcutaneous  Fascia: No Tendon: No Muscle: No Joint: No Bone: No] Epithelialization: [1:Medium (34-66%)] [7:Large (67-100%)] [8:None] Procedures Performed: [1:N/A 9] [7:N/A N/A] [8:N/A N/A] Photos: [1:No Photos] [7:N/A] [8:N/A] Wound Location: [1:Right Lower Leg - Lateral N/A] [8:N/A] Wounding Event: [1:Blister] [7:N/A] [8:N/A] Primary Etiology: [1:Diabetic Wound/Ulcer of the N/A Lower Extremity] [8:N/A] Comorbid History: [1:Sleep Apnea, Congestive N/A Heart Failure, Hypertension, Type II Diabetes, Neuropathy, Received Chemotherapy] [8:N/A] Date Acquired: [1:09/19/2019] [7:N/A] [8:N/A] Weeks of Treatment: [1:0] [7:N/A] [8:N/A] Wound Status: [1:Open] [7:N/A] [8:N/A] Measurements L x W x D 0.5x1.5x0.1 [7:N/A] [8:N/A] (cm) Area (cm) : [1:0.589] [7:N/A] [8:N/A] Volume (cm) : [1:0.059] [7:N/A] [8:N/A] % Reduction in Area: [1:0.00%] [7:N/A] [8:N/A] % Reduction in Volume: 0.00% [7:N/A] [8:N/A] Classification: [1:Grade 2] [7:N/A] [8:N/A] Exudate Amount: [1:Small] [7:N/A] [8:N/A] Exudate Type: [1:Serosanguineous] [7:N/A] [8:N/A] Exudate Color: [1:red, brown] [7:N/A] [8:N/A] Wound Margin: [1:Distinct, outline attached N/A] [8:N/A] Granulation Amount: [1:Large (67-100%)] [7:N/A] [8:N/A] Granulation Quality: [1:Pink] [7:N/A] [8:N/A] Necrotic  Amount: [1:None Present (0%)] [7:N/A] [8:N/A] Exposed Structures: [1:Fat Layer (Subcutaneous N/A Tissue) Exposed: Yes Fascia: No Tendon: No Muscle: No Joint: No Bone: No] [8:N/A] Epithelialization: [1:None] [7:N/A N/A] [8:N/A N/A] Treatment Notes Electronic Signature(s) Signed: 09/20/2019 7:57:15 AM By: Linton Ham MD Entered By: Linton Ham on 09/19/2019 14:14:17 -------------------------------------------------------------------------------- Multi-Disciplinary Care Plan Details Patient Name: Date of Service: [name] 09/19/2019 12:30 PM Medical Record [id number] Patient Account Number: [id number] Date of Birth/Sex: Treating RN: [birth year] (73 y.o. [name], Tammi Klippel Primary Care Savahna Casados: [name] Other Clinician: Referring Milania Haubner: Treating Kathern Lobosco/Extender:Robson, [name], [name] in Treatment: 9 Active Inactive Nutrition Nursing Diagnoses: Potential for alteratiion in Nutrition/Potential for imbalanced nutrition Goals: Patient/caregiver agrees to and verbalizes understanding of need to obtain nutritional consultation Date Initiated: 07/18/2019 Target Resolution Date: 09/13/2019 Goal Status: Active Interventions: Assess patient nutrition upon admission and as needed per policy Provide education on nutrition Treatment Activities: Education provided on Nutrition : 08/29/2019 Patient referred to Primary Care Physician for further nutritional evaluation : 07/18/2019 Notes: Pain, Acute or Chronic Nursing Diagnoses: Pain, acute or chronic: actual or potential Potential alteration in comfort, pain Goals: Patient will verbalize adequate pain control and receive pain control interventions during procedures as needed Date Initiated: 07/18/2019 Target Resolution Date: 09/13/2019 Goal Status: Active Patient/caregiver will  verbalize comfort level met Date Initiated: 07/18/2019 Date Inactivated: 08/08/2019 Target Resolution Date:  08/23/2019 Goal Status: Met Interventions: Encourage patient to take pain medications as prescribed Provide education on pain management Reposition patient for comfort Treatment Activities: Administer pain control measures as ordered : 07/18/2019 Notes: Electronic Signature(s) Signed: 09/19/2019 6:37:14 PM By: Deon Pilling Entered By: Deon Pilling on 09/19/2019 13:56:58 -------------------------------------------------------------------------------- Pain Assessment Details Patient Name: Date of Service: KERWIN, AUGUSTUS 09/19/2019 12:30 PM Medical Record [id number] Patient Account Number: [id number] Date of Birth/Sex: Treating RN: 06/16/1946 (73 y.o. M) Primary Care Franky Reier: [name] Other Clinician: Referring Sharada Albornoz: Treating Ceyda Peterka/Extender:Robson, [name], [name] in Treatment: 9 Active Problems Location of Pain Severity and Description of Pain Patient Has Paino No Site Locations Pain Management and Medication Current Pain Management: Electronic Signature(s) Signed: 09/24/2019 2:59:21 PM By: Sandre Kitty Entered By: Sandre Kitty on 09/19/2019 13:23:00 -------------------------------------------------------------------------------- Patient/Caregiver Education Details Patient Name: Date of Service: [name] 12/3/2020andnbsp12:30 PM Medical Record [phone] Patient Account Number: [id number] Date of Birth/Gender: Treating RN: 18-Jun-1946 (73 y.o. [name] Primary Care Physician: [name] Other Clinician: Referring Physician: Treating Physician/Extender:Robson, [name], [name] in Treatment: 9 Education Assessment Education Provided To: Patient Education Topics Provided Nutrition: Handouts: Nutrition Methods: Explain/Verbal Responses: Reinforcements needed Electronic Signature(s) Signed: 09/19/2019 6:37:14 PM By: Deon Pilling Entered By: Deon Pilling on 09/19/2019  13:59:28 -------------------------------------------------------------------------------- Wound Assessment Details Patient Name: Date of Service: [name], [name] 09/19/2019 12:30 PM Medical Record [id number] Patient Account Number: [id number] Date of Birth/Sex: Treating RN: 1945/10/27 (73 y.o. M) Primary Care Seraphim Affinito: [name] Other Clinician: Referring Avaya Mcjunkins: Treating Ndrew Creason/Extender:Robson, [name], [name] in Treatment: 9 Wound Status Wound Number: 1 Primary Trauma, Other Etiology: Wound Location: Right Knee - Medial Wound Open Wounding Event: Trauma Status: Date Acquired: 05/01/2019 Comorbid Sleep Apnea, Congestive Heart Failure, Weeks Of Treatment: 9 History: Hypertension, Type II Diabetes, Neuropathy, Clustered Wound: No Received Chemotherapy Photos Wound Measurements Length: (cm) 0.5 % Reduct Width: (cm) 0.5 % Reduct Depth: (cm) 0.1 Epitheli Area: (cm) 0.196 Tunneli Volume: (cm) 0.02 Undermi Wound Description Full Thickness Without Exposed Support Foul Odo Classification: Structures Slough/F Wound Flat and Intact Margin: Exudate Small Amount: Exudate Serosanguineous Type: Exudate red, brown Color: Wound Bed Granulation Amount: Large (67-100%) Granulation Quality: Red Fascia E Necrotic Amount: None Present (0%) Fat Laye Tendon E Muscle E Joint Ex Bone Exp r After Cleansing: No ibrino No Exposed Structure xposed: No r (Subcutaneous Tissue) Exposed: Yes xposed: No xposed: No posed: No osed: No ion in Area: 99.3% ion in Volume: 99.3% alization: Medium (34-66%) ng: No ning: No Treatment Notes Wound #1 (Right, Medial Knee) 2. Periwound Care Skin Prep 3. Primary Dressing Applied Collegen AG 4. Secondary Dressing Foam Border Dressing Electronic Signature(s) Signed: 09/24/2019 4:34:42 PM By: Mikeal Hawthorne EMT/HBOT Entered By: Mikeal Hawthorne on 09/23/2019  10:30:18 -------------------------------------------------------------------------------- Wound Assessment Details Patient Name: Date of Service: [name], [name] 09/19/2019 12:30 PM Medical Record [id number] Patient Account Number: [id number] Date of Birth/Sex: Treating RN: Oct 25, 1945 (73 y.o. M) Primary Care Ettamae Barkett: [name] Other Clinician: Referring Sakara Lehtinen: Treating Daianna Vasques/Extender:Robson, [name], [name] in Treatment: 9 Wound Status Wound Number: 7 Primary Venous Leg Ulcer Etiology: Wound Location: Right Lower Leg - Posterior Wound Healed - Epithelialized Wounding Event: Blister Status: Date Acquired: 05/01/2019 Comorbid Sleep Apnea, Congestive Heart Failure, Weeks Of Treatment: 9 History: Hypertension, Type II Diabetes, Neuropathy, Clustered Wound: No Received Chemotherapy Photos Wound Measurements Length: (cm) 0 %  Reduct Width: (cm) 0 % Reduct Depth: (cm) 0 Epitheli Area: (cm) 0 Tunneli Volume: (cm) 0 Undermi Wound Description Full Thickness Without Exposed Support Foul Od Classification: Structures Slough/ Wound Flat and Intact Margin: Exudate None Present Amount: Wound Bed Granulation Amount: None Present (0%) Necrotic Amount: None Present (0%) Fascia Fat Lay Tendon Muscle Joint E Bone Ex or After Cleansing: No Fibrino No Exposed Structure Exposed: No er (Subcutaneous Tissue) Exposed: No Exposed: No Exposed: No xposed: No posed: No ion in Area: 100% ion in Volume: 100% alization: Large (67-100%) ng: No ning: No Electronic Signature(s) Signed: 09/24/2019 4:34:42 PM By: Mikeal Hawthorne EMT/HBOT Entered By: Mikeal Hawthorne on 09/23/2019 10:30:44 -------------------------------------------------------------------------------- Wound Assessment Details Patient Name: Date of Service: [name] 09/19/2019 12:30 PM Medical Record [id number] Patient Account Number: [id number] Date of  Birth/Sex: Treating RN: 07-21-1946 (73 y.o. M) Primary Care Sreekar Broyhill: [name] Other Clinician: Referring Mouhamad Teed: Treating Raymon Schlarb/Extender:Robson, [name], [name] in Treatment: 9 Wound Status Wound Number: 8 Primary Diabetic Wound/Ulcer of the Lower Extremity Etiology: Wound Location: Right Knee - Lateral Wound Open Wounding Event: Blister Status: Date Acquired: 09/19/2019 Comorbid Sleep Apnea, Congestive Heart Failure, Weeks Of Treatment: 0 History: Hypertension, Type II Diabetes, Neuropathy, Clustered Wound: No Received Chemotherapy Photos Wound Measurements Length: (cm) 3 % Reduction i Width: (cm) 2 % Reduction i Depth: (cm) 0.1 Epithelializa Area: (cm) 4.712 Tunneling: Volume: (cm) 0.471 Undermining: Wound Description Classification: Grade 2 Foul Odor Af Wound Margin: Distinct, outline attached Slough/Fibri Exudate Amount: Small Exudate Type: Serosanguineous Exudate Color: red, brown Wound Bed Granulation Amount: Large (67-100%) Granulation Quality: Pink Fascia Expose Fat Layer (Su Tendon Expose Muscle Expose Joint Exposed Bone Exposed: ter Cleansing: No no No Exposed Structure d: No bcutaneous Tissue) Exposed: Yes d: No d: No : No No n Area: 0% n Volume: 0% tion: None No No Treatment Notes Wound #8 (Right, Lateral Knee) 2. Periwound Care Skin Prep 3. Primary Dressing Applied Collegen AG 4. Secondary Dressing Foam Border Dressing Electronic Signature(s) Signed: 09/24/2019 4:34:42 PM By: Mikeal Hawthorne EMT/HBOT Entered By: Mikeal Hawthorne on 09/23/2019 10:38:20 -------------------------------------------------------------------------------- Wound Assessment Details Patient Name: Date of Service: [name], [name] 09/19/2019 12:30 PM Medical Record [id number] Patient Account Number: [id number] Date of Birth/Sex: Treating RN: [birth year] (73 y.o. M) Primary Care Josip Merolla: [name] Other  Clinician: Referring Dayln Tugwell: Treating Harsh Trulock/Extender:Robson, [name], [name] in Treatment: 9 Wound Status Wound Number: 9 Primary Diabetic Wound/Ulcer of the Lower Extremity Etiology: Wound Location: Right Lower Leg - Lateral Wound Open Wounding Event: Blister Status: Date Acquired: 09/19/2019 Comorbid Sleep Apnea, Congestive Heart Failure, Weeks Of Treatment: 0 History: Hypertension, Type II Diabetes, Neuropathy, Clustered Wound: No Received Chemotherapy Photos Wound Measurements Length: (cm) 0.5 Width: (cm) 1.5 Depth: (cm) 0.1 Area: (cm) 0.589 Volume: (cm) 0.059 Wound Description Classification: Grade 2 Wound Margin: Distinct, outline attached Exudate Amount: Small Exudate Type: Serosanguineous Exudate Color: red, brown Wound Bed Granulation Amount: Large (67-100%) Granulation Quality: Pink Necrotic Amount: None Present (0%) Foul Odor After Cleansing: Slough/Fibrino Exposed Structure Fascia Exposed: N Fat Layer (Subcutaneous Tissue) Exposed: Y Tendon Exposed: N Muscle Exposed: N Joint Exposed: N Bone Exposed: N % Reduction in Area: 0% % Reduction in Volume: 0% Epithelialization: None Tunneling: No Undermining: No No No o es o o o o Treatment Notes Wound #9 (Right, Lateral Lower Leg) 2. Periwound Care Moisturizing lotion 3. Primary Dressing Applied Collegen AG 4. Secondary Dressing Dry Gauze 6. Support Layer Applied 4 layer compression Water quality scientist)  Signed: 09/24/2019 4:34:42 PM By: Mikeal Hawthorne EMT/HBOT Entered By: Mikeal Hawthorne on 09/23/2019 10:38:37 -------------------------------------------------------------------------------- Vitals Details Patient Name: Date of Service: [name] 09/19/2019 12:30 PM Medical Record [id number] Patient Account Number: [id number] Date of Birth/Sex: Treating RN: 12-04-1945 (73 y.o. M) Primary Care Asah Lamay: [name] Other Clinician: Referring  Dotti Busey: Treating Aune Adami/Extender:Robson, [name], [name] in Treatment: 9 Vital Signs Time Taken: 13:20 Temperature (F): 98.4 Height (in): 77 Pulse (bpm): 49 Weight (lbs): 350 Respiratory Rate (breaths/min): 18 Body Mass Index (BMI): 41.5 Blood Pressure (mmHg): 135/57 Reference Range: 80 - 120 mg / dl Electronic Signature(s) Signed: 09/24/2019 2:59:21 PM By: Sandre Kitty Entered By: Sandre Kitty on 09/19/2019 13:22:41

## 2019-09-26 DIAGNOSIS — I87331 Chronic venous hypertension (idiopathic) with ulcer and inflammation of right lower extremity: Secondary | ICD-10-CM | POA: Diagnosis not present

## 2019-09-26 DIAGNOSIS — I110 Hypertensive heart disease with heart failure: Secondary | ICD-10-CM | POA: Diagnosis not present

## 2019-09-26 DIAGNOSIS — L97811 Non-pressure chronic ulcer of other part of right lower leg limited to breakdown of skin: Secondary | ICD-10-CM | POA: Diagnosis not present

## 2019-09-26 DIAGNOSIS — L03115 Cellulitis of right lower limb: Secondary | ICD-10-CM | POA: Diagnosis not present

## 2019-09-26 DIAGNOSIS — I87302 Chronic venous hypertension (idiopathic) without complications of left lower extremity: Secondary | ICD-10-CM | POA: Diagnosis not present

## 2019-09-26 DIAGNOSIS — L97211 Non-pressure chronic ulcer of right calf limited to breakdown of skin: Secondary | ICD-10-CM | POA: Diagnosis not present

## 2019-09-30 ENCOUNTER — Other Ambulatory Visit: Payer: Self-pay | Admitting: Family Medicine

## 2019-09-30 DIAGNOSIS — L03115 Cellulitis of right lower limb: Secondary | ICD-10-CM | POA: Diagnosis not present

## 2019-09-30 DIAGNOSIS — I87302 Chronic venous hypertension (idiopathic) without complications of left lower extremity: Secondary | ICD-10-CM | POA: Diagnosis not present

## 2019-09-30 DIAGNOSIS — I87331 Chronic venous hypertension (idiopathic) with ulcer and inflammation of right lower extremity: Secondary | ICD-10-CM | POA: Diagnosis not present

## 2019-09-30 DIAGNOSIS — I110 Hypertensive heart disease with heart failure: Secondary | ICD-10-CM | POA: Diagnosis not present

## 2019-09-30 DIAGNOSIS — L97211 Non-pressure chronic ulcer of right calf limited to breakdown of skin: Secondary | ICD-10-CM | POA: Diagnosis not present

## 2019-09-30 DIAGNOSIS — L97811 Non-pressure chronic ulcer of other part of right lower leg limited to breakdown of skin: Secondary | ICD-10-CM | POA: Diagnosis not present

## 2019-10-03 ENCOUNTER — Encounter (HOSPITAL_BASED_OUTPATIENT_CLINIC_OR_DEPARTMENT_OTHER): Payer: Medicare Other | Admitting: Internal Medicine

## 2019-10-03 ENCOUNTER — Other Ambulatory Visit: Payer: Self-pay

## 2019-10-03 DIAGNOSIS — E119 Type 2 diabetes mellitus without complications: Secondary | ICD-10-CM | POA: Diagnosis not present

## 2019-10-03 DIAGNOSIS — S81011D Laceration without foreign body, right knee, subsequent encounter: Secondary | ICD-10-CM | POA: Diagnosis not present

## 2019-10-03 DIAGNOSIS — S81001A Unspecified open wound, right knee, initial encounter: Secondary | ICD-10-CM | POA: Diagnosis not present

## 2019-10-03 DIAGNOSIS — S81801A Unspecified open wound, right lower leg, initial encounter: Secondary | ICD-10-CM | POA: Diagnosis not present

## 2019-10-03 NOTE — Progress Notes (Addendum)
LAVONNE, CASS ([id number]) Visit Report for 10/03/2019 Arrival Information Details Patient Name: Date of Service: [name], [name] 10/03/2019 12:30 PM Medical Record [id number] Patient Account Number: [id number] Date of Birth/Sex: Treating RN: 08-14-1946 (73 y.o. [name] Primary Care Khale Nigh: [name] Other Clinician: Referring Jaye Polidori: Treating Tiffny Gemmer/Extender:Robson, [name], [name] in Treatment: 11 Visit Information History Since Last Visit Added or deleted any medications: No Patient Arrived: Ambulatory Any new allergies or adverse reactions: No Arrival Time: 12:36 Had a fall or experienced change in No Accompanied By: wife activities of daily living that may affect Transfer Assistance: None risk of falls: Patient Identification Verified: Yes Signs or symptoms of abuse/neglect since last No Secondary Verification Process Yes visito Completed: Hospitalized since last visit: No Patient Requires Transmission- No Implantable device outside of the clinic excluding No Based Precautions: cellular tissue based products placed in the center Patient Has Alerts: Yes since last visit: Patient Alerts: Patient on Blood Has Dressing in Place as Prescribed: Yes Thinner Has Compression in Place as Prescribed: Yes Pain Present Now: No Electronic Signature(s) Signed: 10/03/2019 5:33:40 PM By: Kela Millin Entered By: Kela Millin on 10/03/2019 12:37:05 -------------------------------------------------------------------------------- Clinic Level of Care Assessment Details Patient Name: Date of Service: [name], [name] 10/03/2019 12:30 PM Medical Record [id number] Patient Account Number: [id number] Date of Birth/Sex: Treating RN: [birth year] (73 y.o. [name], Tammi Klippel Primary Care Asal Teas: [name] Other Clinician: Referring Kamryn Messineo: Treating Elmire Amrein/Extender:Robson, [name], [name] in  Treatment: 11 Clinic Level of Care Assessment Items TOOL 4 Quantity Score X - Use when only an EandM is performed on FOLLOW-UP visit 1 0 ASSESSMENTS - Nursing Assessment / Reassessment X - Reassessment of Co-morbidities (includes updates in patient status) 1 10 X - Reassessment of Adherence to Treatment Plan 1 5 ASSESSMENTS - Wound and Skin Assessment / Reassessment X - Simple Wound Assessment / Reassessment - one wound 1 5 _0  - Complex Wound Assessment / Reassessment - multiple wounds 0 X - Dermatologic / Skin Assessment (not related to wound area) 1 10 ASSESSMENTS - Focused Assessment _1  - Circumferential Edema Measurements - multi extremities 0 X - Nutritional Assessment / Counseling / Intervention 1 10 _2  - Lower Extremity Assessment (monofilament, tuning fork, pulses) 0 _3  - Peripheral Arterial Disease Assessment (using hand held doppler) 0 ASSESSMENTS - Ostomy and/or Continence Assessment and Care _4  - Incontinence Assessment and Management 0 _5  - Ostomy Care Assessment and Management (repouching, etc.) 0 PROCESS - Coordination of Care X - Simple Patient / Family Education for ongoing care 1 15 _6  - Complex (extensive) Patient / Family Education for ongoing care 0 X - Staff obtains Programmer, systems, Records, Test Results / Process Orders 1 10 _7  - Staff telephones HHA, Nursing Homes / Clarify orders / etc 0 _8  - Routine Transfer to another Facility (non-emergent condition) 0 _9  - Routine Hospital Admission (non-emergent condition) 0 _10  - New Admissions / Biomedical engineer / Ordering NPWT, Apligraf, etc. 0 _11  - Emergency Hospital Admission (emergent condition) 0 X - Simple Discharge Coordination 1 10 _12  - Complex (extensive) Discharge Coordination 0 PROCESS - Special Needs _13  - Pediatric / Minor Patient Management 0 _14  - Isolation Patient Management 0 _15  - Hearing / Language / Visual special needs 0 _16  - Assessment of Community assistance (transportation, D/C planning, etc.)  0 _17  - Additional assistance / Altered mentation 0 _18  - Support Surface(s) Assessment (bed, cushion, seat, etc.) 0 INTERVENTIONS - Wound Cleansing / Measurement X - Simple Wound Cleansing -  one wound 1 5 _0  - Complex Wound Cleansing - multiple wounds 0 X - Wound Imaging (photographs - any number of wounds) 1 5 _1  - Wound Tracing (instead of photographs) 0 X - Simple Wound Measurement - one wound 1 5 _2  - Complex Wound Measurement - multiple wounds 0 INTERVENTIONS - Wound Dressings _3  - Small Wound Dressing one or multiple wounds 0 X - Medium Wound Dressing one or multiple wounds 1 15 _4  - Large Wound Dressing one or multiple wounds 0 <ZOXWRUEAVWUJWJXB>_1<\/YNWGNFAOZHYQMVHQ>_4  - Application of Medications - topical 0 <ONGEXBMWUXLKGMWN>_0<\/UVOZDGUYQIHKVQQV>_9  - Application of Medications - injection 0 INTERVENTIONS - Miscellaneous _7  - External ear exam 0 _8  - Specimen Collection (cultures, biopsies, blood, body fluids, etc.) 0 _9  - Specimen(s) / Culture(s) sent or taken to Lab for analysis 0 _10  - Patient Transfer (multiple staff / Civil Service fast streamer / Similar devices) 0 _11  - Simple Staple / Suture removal (25 or less) 0 _12  - Complex Staple / Suture removal (26 or more) 0 _13  - Hypo / Hyperglycemic Management (close monitor of Blood Glucose) 0 _14  - Ankle / Brachial Index (ABI) - do not check if billed separately 0 X - Vital Signs 1 5 Has the patient been seen at the hospital within the last three years: Yes Total Score: 110 Level Of Care: New/Established - Level 3 Electronic Signature(s) Signed: 10/03/2019 5:34:18 PM By: Deon Pilling Entered By: Deon Pilling on 10/03/2019 14:22:42 -------------------------------------------------------------------------------- Encounter Discharge Information Details Patient Name: Date of Service: [name] 10/03/2019 12:30 PM Medical Record [id number] Patient Account Number: [id number] Date of Birth/Sex: Treating RN: 1946/03/28 (73 y.o. Oval Linsey Primary Care Saher Davee: [name] Other  Clinician: Referring Raekwan Spelman: Treating Jadene Stemmer/Extender:Robson, [name], [name] in Treatment: 11 Encounter Discharge Information Items Discharge Condition: Stable Ambulatory Status: Walker Discharge Destination: Home Transportation: Private Auto Accompanied By: family member Schedule Follow-up Appointment: Yes Clinical Summary of Care: Patient Declined Electronic Signature(s) Signed: 10/03/2019 4:51:50 PM By: Carlene Coria RN Entered By: Carlene Coria on 10/03/2019 13:33:48 -------------------------------------------------------------------------------- Lower Extremity Assessment Details Patient Name: Date of Service: GUSTAVUS, HASKIN 10/03/2019 12:30 PM Medical Record [id number] Patient Account Number: [id number] Date of Birth/Sex: Treating RN: 03/08/1946 (73 y.o. [name] Primary Care Samika Vetsch: [name] Other Clinician: Referring Darleth Eustache: Treating Leafy Motsinger/Extender:Robson, [name], [name] in Treatment: 11 Edema Assessment Assessed: [Left: No] [Right: No] Edema: [Left: Ye] [Right: s] Calf Left: Right: Point of Measurement: 34 cm From Medial Instep cm 41 cm Ankle Left: Right: Point of Measurement: 13 cm From Medial Instep cm 25.6 cm Vascular Assessment Pulses: Dorsalis Pedis Palpable: [Right:Yes] Electronic Signature(s) Signed: 10/03/2019 5:33:40 PM By: Kela Millin Entered By: Kela Millin on 10/03/2019 12:42:52 -------------------------------------------------------------------------------- Multi Wound Chart Details Patient Name: Date of Service: [name] 10/03/2019 12:30 PM Medical Record [id number] Patient Account Number: [id number] Date of Birth/Sex: Treating RN: [birth year] (73 y.o. 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] [9:Lower Extremity] Comorbid History: [1:Sleep Apnea, Congestive Sleep Apnea, Congestive Sleep Apnea, Congestive Heart Failure, Hypertension, Heart Failure, Hypertension, Heart Failure, Hypertension, Type  II Diabetes, Neuropathy, Received Chemotherapy] [8:Type II Diabetes,  Neuropathy, Received Chemotherapy] [9:Type II Diabetes, Neuropathy, Received Chemotherapy] Date Acquired: [1:05/01/2019] [8:09/19/2019] [9:09/19/2019] Weeks of Treatment: [1:11] [8:2] [9:2] Wound Status: [1:Open] [8:Open] [9:Open] Clustered Wound: [1:No] [8:Yes] [9:No] Clustered Quantity: [1:N/A] [8:3] [9:N/A] Measurements L x W x D 0x0x0 [8:3.5x3.4x0.1] [9:0x0x0] (cm) Area (cm) : [1:0] [8:9.346] [9:0] Volume (cm) : [1:0] [8:0.935] [9:0] % Reduction in Area: [1:100.00%] [8:-98.30%] [9:100.00%] % Reduction in Volume: 100.00% [8:-98.50%] [9:100.00%] Classification: [1:Full Thickness Without Exposed Support Structures] [8:Grade 2] [9:Grade 2] Exudate Amount: [1:None Present] [8:Small] [9:None Present] Exudate Type: [1:N/A] [8:Serosanguineous] [9:N/A] Exudate Color: [1:N/A] [8:red, brown] [9:N/A] Wound Margin: [1:Flat and Intact] [8:Distinct, outline attached] [9:Distinct, outline attached] Granulation Amount: [1:None Present (0%)] [8:Large (67-100%)] [9:None Present (0%)] Granulation Quality: [1:N/A] [8:Red, Pink] [9:N/A] Necrotic Amount: [1:None Present (0%)] [8:N/A] [9:None Present (0%)] Exposed Structures: [1:Fascia: No Fat Layer (Subcutaneous Tissue) Exposed: No Tendon: No Muscle:  No Joint: No Bone: No Large (67-100%)] [8:Fat Layer (Subcutaneous Tissue) Exposed: Yes Fascia: No Tendon: No Muscle: No Joint: No Bone: No Medium (34-66%)] [9:Fascia: No Fat  Layer (Subcutaneous Tissue) Exposed: No Tendon: No Muscle: No Joint: No Bone: No Large (67-100%)] Treatment Notes Wound #8 (Right, Lateral Knee) 1. Cleanse With Wound Cleanser Soap and water 2. Periwound Care Skin Prep 3. Primary Dressing Applied Collegen AG Hydrogel or K-Y Jelly 4. Secondary Dressing Foam Border Dressing Electronic Signature(s) Signed: 10/03/2019 5:01:00 PM By: Linton Ham MD Entered By: Linton Ham on 10/03/2019 13:41:59 -------------------------------------------------------------------------------- Multi-Disciplinary Care Plan Details Patient Name: Date of Service: CLEBERT, WENGER 10/03/2019 12:30 PM Medical Record [id number] Patient Account Number: [id number] Date of Birth/Sex: Treating RN: [birth year] (73 y.o. [name], Tammi Klippel Primary Care Brodey Bonn: [name] Other Clinician: Referring Rubina Basinski: Treating Jadrien Narine/Extender:Robson, [name], [name] in Treatment: 11 Active Inactive Nutrition Nursing Diagnoses: Potential for alteratiion in Nutrition/Potential for imbalanced nutrition Goals: Patient/caregiver agrees to and verbalizes understanding of need to obtain nutritional consultation Date Initiated: 07/18/2019 Target Resolution Date: 10/18/2019 Goal Status: Active Interventions: Assess patient nutrition upon admission and as needed per policy Provide education on nutrition Treatment Activities: Education provided on Nutrition : 09/19/2019 Patient referred to Primary Care Physician for further nutritional evaluation : 07/18/2019 Notes: Pain, Acute or Chronic Nursing Diagnoses: Pain, acute or chronic: actual or potential Potential alteration in comfort, pain Goals: Patient will verbalize adequate pain control and receive pain control  interventions during procedures as needed Date Initiated: 07/18/2019 Target Resolution Date: 10/18/2019 Goal Status: Active Patient/caregiver will verbalize comfort level met Date Initiated: 07/18/2019 Date Inactivated: 08/08/2019 Target Resolution Date: 08/23/2019 Goal Status: Met Interventions: Encourage patient to take pain medications as prescribed Provide education on pain management Reposition patient for comfort Treatment Activities: Administer pain control measures as ordered : 07/18/2019 Notes: Electronic Signature(s) Signed: 10/03/2019 5:34:18 PM By: Deon Pilling Entered By: Deon Pilling on 10/03/2019 13:07:17 -------------------------------------------------------------------------------- Pain Assessment Details Patient Name: Date of Service: ALDIN, DREES 10/03/2019 12:30 PM Medical Record [id number] Patient Account Number: [id number] Date of Birth/Sex: Treating RN: August 22, 1946 (73 y.o. [name] Primary Care Maebelle Sulton: [name] Other Clinician: Referring Cordale Manera: Treating Leroi Haque/Extender:Robson, [name], [name] in Treatment: 11 Active Problems Location of Pain Severity and Description of Pain Patient Has Paino No Patient Has Paino No Site Locations Pain Management and Medication Current Pain Management: Electronic Signature(s) Signed: 10/03/2019 5:33:40 PM By: Kela Millin Entered By: Kela Millin on 10/03/2019 12:38:07 -------------------------------------------------------------------------------- Patient/Caregiver Education Details Arlee Muslim, [name] 12/17/2020andnbsp12:30 Patient Name: Date of Service: C.  PM Medical Record Patient Account Number: [id number] Number: Treating RN: Deon Pilling Date of Birth/Gender: Jun 18, 1946 (73 y.o. M) Other Clinician: Primary Care Physician: Arta Bruce Referring Physician: Physician/Extender: Brandy Hale in Treatment:  11 Education Assessment Education Provided To: Patient Education Topics Provided Pain: Handouts: A Guide to Pain Control Methods: Explain/Verbal Responses: Reinforcements needed Electronic Signature(s) Signed: 10/03/2019 5:34:18 PM By: Deon Pilling Entered By: Deon Pilling on 10/03/2019 13:07:30 -------------------------------------------------------------------------------- Wound Assessment Details Patient Name: Date of Service: GALEN, RUSSMAN 10/03/2019 12:30 PM Medical Record [id number] Patient Account Number: [id number] Date of Birth/Sex: Treating RN: February 06, 1946 (73 y.o. [name] Primary Care Megean Fabio: [name] Other Clinician: Referring Avanelle Pixley: Treating Panagiotis Oelkers/Extender:Robson, [name], [name] in Treatment: 11 Wound Status Wound Number: 1 Primary Trauma, Other Etiology: Wound Location: Right Knee - Medial Wound Healed - Epithelialized Wounding Event: Trauma Status: Date Acquired: 05/01/2019 Comorbid Sleep Apnea, Congestive Heart Failure, Weeks Of Treatment: 11 History: Hypertension, Type II Diabetes, Neuropathy, Clustered Wound: No Received Chemotherapy Photos Wound Measurements Length: (cm) 0 % Reduc Width: (cm) 0 % Reduc Depth: (cm) 0 Epithel Area: (cm) 0 Tunnel Volume: (cm) 0 Underm Wound Description Classification: Full Thickness Without Exposed Support Foul Od Structures Slough/ Wound Flat and Intact Margin: Exudate None Present Amount: Wound Bed Granulation Amount: None Present (0%) Necrotic Amount: None Present (0%) Fascia Fat Lay Tendon Muscle Joint E Bone Ex or After Cleansing: No Fibrino No Exposed Structure Exposed: No er (Subcutaneous Tissue) Exposed: No Exposed: No Exposed: No xposed: No posed: No tion in Area: 100% tion in Volume: 100% ialization: Large (67-100%) ing: No ining: No Electronic Signature(s) Signed: 10/04/2019 5:07:56 PM By: Mikeal Hawthorne EMT/HBOT Signed:  10/04/2019 5:42:17 PM By: Kela Millin Previous Signature: 10/03/2019 5:33:40 PM Version By: Kela Millin Entered By: Mikeal Hawthorne on 10/04/2019 14:52:48 -------------------------------------------------------------------------------- Wound Assessment Details Patient Name: Date of Service: [name] 10/03/2019 12:30 PM Medical Record [id number] Patient Account Number: [id number] Date of Birth/Sex: Treating RN: 01-26-1946 (73 y.o. [name] Primary Care Gurkirat Basher: [name] Other Clinician: Referring Avea Mcgowen: Treating Joleah Kosak/Extender:Robson, [name], [name] in Treatment: 11 Wound Status Wound Number: 8 Primary Diabetic Wound/Ulcer of the Lower Extremity Etiology: Wound Location: Right Knee - Lateral Wound Open Wounding Event: Blister Status: Date Acquired: 09/19/2019 Comorbid Sleep Apnea, Congestive Heart Failure, Weeks Of Treatment: 2 History: Hypertension, Type II Diabetes, Neuropathy, Clustered Wound: Yes Received Chemotherapy Photos Wound Measurements Length: (cm) 3.5 % Reductio Width: (cm) 3.4 % Reductio Depth: (cm) 0.1 Epithelial Clustered Quantity: 3 Tunneling: Area: (cm) 9.346 Undermini Volume: (cm) 0.935 Wound Description Classification: Grade 2 Wound Margin: Distinct, outline attached Exudate Amount: Small Exudate Type: Serosanguineous Exudate Color: red, brown Wound Bed Granulation Amount: Large (67-100%) Granulation Quality: Red, Pink Foul Odor After Cleansing: No Slough/Fibrino No Exposed Structure Fascia Exposed: No Fat Layer (Subcutaneous Tissue) Exposed: Yes Tendon Exposed: No Muscle Exposed: No Joint Exposed: No Bone Exposed: No n in Area: -98.3% n in Volume: -98.5% ization: Medium (34-66%) No ng: No Treatment Notes Wound #8 (Right, Lateral Knee) 1. Cleanse With Wound Cleanser Soap and water 2. Periwound Care Skin Prep 3. Primary Dressing Applied Collegen AG Hydrogel or K-Y  Jelly 4. Secondary Dressing Foam Border Dressing Electronic Signature(s) Signed: 10/04/2019 5:07:56 PM By: Mikeal Hawthorne EMT/HBOT Signed: 10/04/2019 5:42:17 PM By: Kela Millin Previous Signature: 10/03/2019 5:33:40 PM Version By: Kela Millin Entered By: Mikeal Hawthorne on 10/04/2019 14:52:25 -------------------------------------------------------------------------------- Wound Assessment Details Patient Name: Date of  Service: SAUNDERS, ARLINGTON 10/03/2019 12:30 PM Medical Record [id number] Patient Account Number: [id number] Date of Birth/Sex: Treating RN: 07-04-1946 (73 y.o. [name] Primary Care Bayden Gil: [name] Other Clinician: Referring Fredy Gladu: Treating Lyndi Holbein/Extender:Robson, [name], [name] in Treatment: 11 Wound Status Wound Number: 9 Primary Diabetic Wound/Ulcer of the Lower Extremity Etiology: Wound Location: Right Lower Leg - Lateral Wound Healed - Epithelialized Wounding Event: Blister Status: Date Acquired: 09/19/2019 Comorbid Sleep Apnea, Congestive Heart Failure, Weeks Of Treatment: 2 History: Hypertension, Type II Diabetes, Neuropathy, Clustered Wound: No Received Chemotherapy Photos Wound Measurements Length: (cm) 0 % Reductio Width: (cm) 0 % Reductio Depth: (cm) 0 Epithelial Area: (cm) 0 Tunneling Volume: (cm) 0 Undermini Wound Description Classification: Grade 2 Foul Odor Wound Margin: Distinct, outline attached Slough/Fib Exudate Amount: None Present Wound Bed Granulation Amount: None Present (0%) Necrotic Amount: None Present (0%) Fascia Exp Fat Layer Tendon Exp Muscle Exp Joint Expo Bone Expos After Cleansing: No rino No Exposed Structure osed: No (Subcutaneous Tissue) Exposed: No osed: No osed: No sed: No ed: No n in Area: 100% n in Volume: 100% ization: Large (67-100%) : No ng: No Electronic Signature(s) Signed: 10/04/2019 5:07:56 PM By: Mikeal Hawthorne EMT/HBOT Signed:  10/04/2019 5:42:17 PM By: Kela Millin Previous Signature: 10/03/2019 5:33:40 PM Version By: Kela Millin Entered By: Mikeal Hawthorne on 10/04/2019 14:52:04 -------------------------------------------------------------------------------- Vitals Details Patient Name: Date of Service: [name]. 10/03/2019 12:30 PM Medical Record [id number] Patient Account Number: [id number] Date of Birth/Sex: Treating RN: 04-19-1946 (73 y.o. [name] Primary Care Analyah Mcconnon: [name] Other Clinician: Referring Stephani Janak: Treating Jammy Plotkin/Extender:Robson, [name], [name] in Treatment: 11 Vital Signs Time Taken: 12:35 Temperature (F): 98.3 Height (in): 77 Pulse (bpm): 52 Weight (lbs): 350 Respiratory Rate (breaths/min): 18 Body Mass Index (BMI): 41.5 Blood Pressure (mmHg): 128/55 Reference Range: 80 - 120 mg / dl Electronic Signature(s) Signed: 10/03/2019 5:33:40 PM By: Kela Millin Entered By: Kela Millin on 10/03/2019 12:38:00

## 2019-10-03 NOTE — Progress Notes (Signed)
DORNELL, GRASMICK ([id number]) Visit Report for 10/03/2019 HPI Details Patient Name: Date of Service: [name], ANNE 10/03/2019 12:30 PM Medical Record [id number] Patient Account Number: [id number] Date of Birth/Sex: Treating RN: November 16, 1945 (73 y.o. M) Primary Care Provider: [name] Other Clinician: Referring Provider: Treating Provider/Extender:Rockey Guarino, [name], [name] in Treatment: 11 History of Present Illness HPI Description: ADMISSION 07/18/2019 Patient is a 73 year old man with type 2 diabetes and also chronic edema. He had a fall in July. Fairly substantial wound on the right medial knee and across the top of the patella. This is actually made some progress since then. He was seen in the ED on 8/18 with cellulitis. He was treated with doxycycline. This got extended I think for as long as 30 days. On 9/15 he was seen by his primary felt to have cellulitis with open wounds. He currently has 7 wounds the remnant of the initial contusion injury on the right medial knee which actually I think is actually quite a bit smaller than it was looking at a picture on his wife's cell phone. He also has 6 is wounds on the right lower extremity which includes 2 on the right anterior 3 on the right lateral and one posteriorly. He has compression stockings but has not been wearing them. He says these latter 6 wounds started as blisters that open. He has been using Neosporin. 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 He also has a laceration wound on the lower medial  left thigh just above the knee. We have been using Santyl on the latter, silver alginate on the wounds on the right. The patient does not have an open wound on the left and he has a lot less edema. On the left side. We have given him the number for elastic therapy in [place] for 20/30 stockings on the left leg. I am not certain that that will suffice on the right however 10/13; we received an urgent call from home health this morning reporting that the patient's right leg had a dusky color. We brought him into the clinic for review. He has severe bilateral right greater than left chronic venous insufficiency with secondary lymphedema and has discoloration on the right leg chronically related to this. He also reports he wrapped his upper leg with an Ace wrap and may have done this with excessive tension 10/22. The patient still has a clean wound on the upper anterior thigh the remanent of a very large traumatic wound. Small wound in the right posterior calf in the setting of severe chronic venous hypertension. There is nothing open on the left leg and he has stockings 11/12; I think since the last time the patient was here the area on the traumatic wound just above his knee scabbed over and was felt to be healed. During the course of wound preparation today our intake nurse washed the scab off there is still an open wound here. Also still a small area on the posterior calf on the right in the setting of severe chronic venous hypertension. He has not been dressing the right thigh wound. Using silver alginate on the other wounds 12/3; 3-week follow-up  he has. The area just above his knee anteriorly is improved however he has new areas on the right lateral knee and an area on the right lateral calf. I am not sure how this happened or when. The patient was not even sure when this happened. 12/17; the large area above his knee anteriorly and medially is completely closed today. The new areas from  last time included the right lateral knee and the right lateral calf. The right lateral calf is healed. He still has small superficial areas on the right medial knee Electronic Signature(s) Signed: 10/03/2019 5:01:00 PM By: Linton Ham MD Entered By: Linton Ham on 10/03/2019 13:42:48 -------------------------------------------------------------------------------- Physical Exam Details Patient Name: Date of Service: [name] 10/03/2019 12:30 PM Medical Record [id number] Patient Account Number: [id number] Date of Birth/Sex: Treating RN: [birth year] (73 y.o. M) Primary Care Provider: [name] Other Clinician: Referring Provider: Treating Provider/Extender:Keidy Thurgood, [name], [name] in Treatment: 11 Constitutional Sitting or standing Blood Pressure is within target range for patient.. Pulse regular and within target range for patient.Marland Kitchen Respirations regular, non-labored and within target range.. Temperature is normal and within the target range for the patient.Marland Kitchen Appears in no distress. Eyes Conjunctivae clear. No discharge.no icterus. Respiratory work of breathing is normal. Cardiovascular Pedal pulses palpable and strong bilaterally.. Severe chronic venous insufficiency but edema is controlled. Integumentary (Hair, Skin) Large amount of hemosiderin deposition. Psychiatric appears at normal baseline. Notes Wound exam; the areas on the right medial knee superficial areas remain. Everything else is healed. The patient has severe chronic venous insufficiency. Electronic Signature(s) Signed: 10/03/2019 5:01:00 PM By: Linton Ham MD Entered By: Linton Ham on 10/03/2019 13:46:03 -------------------------------------------------------------------------------- Physician Orders Details Patient Name: Date of Service: [name] 10/03/2019 12:30 PM Medical Record [id number] Patient Account Number: [id number] Date of  Birth/Sex: Treating RN: 1946/05/22 (73 y.o. [name], Meta.Reding Primary Care Provider: [name] Other Clinician: Referring Provider: Treating Provider/Extender:Kaulana Brindle, [name], [name] in Treatment: 11 Verbal / Phone Orders: No Diagnosis Coding ICD-10 Coding Code Description I89.0 Lymphedema, not elsewhere classified S81.011D Laceration without foreign body, right knee, subsequent encounter L97.812 Non-pressure chronic ulcer of other part of right lower leg with fat layer exposed L97.811 Non-pressure chronic ulcer of other part of right lower leg limited to breakdown of skin I87.323 Chronic venous hypertension (idiopathic) with inflammation of bilateral lower extremity E11.622 Type 2 diabetes mellitus with other skin ulcer Follow-up Appointments Return Appointment in 2 weeks. Dressing Change Frequency Change Dressing every other day. - home health to change once a week. wife to change all other days. Skin Barriers/Peri-Wound Care Moisturizing lotion - lotion to both legs at night. Wound Cleansing May shower and wash wound with soap and water. - with dressing changes. Primary Wound Dressing Wound #8 Right,Lateral Knee Silver Collagen - moisten with saline or [place] Jelly Secondary Dressing Wound #8 Right,Lateral Knee ABD pad - or self-adhesive foam border. Edema Control Avoid standing for long periods of time Elevate legs to the level of the heart or above for 30 minutes daily and/or when sitting, a frequency of: - throughout the day. Exercise regularly Support Garment 20-30 mm/Hg pressure to: - Patient to apply in the morning and remove at night. Apply compression stockings to both legs. [facility] for wound care. [facility] to change once a week. wound center to change on Thursdays. Electronic Signature(s) Signed: 10/03/2019 5:01:00 PM By: Linton Ham MD Signed: 10/03/2019 5:34:18 PM By: [name]  Bobbi Entered  By: Deon Pilling on 10/03/2019 13:24:56 -------------------------------------------------------------------------------- Problem List Details Patient Name: Date of Service: [name], [name] 10/03/2019 12:30 PM Medical Record [id number] Patient Account Number: [id number] Date of Birth/Sex: Treating RN: 26-Sep-1946 (73 y.o. [name], Meta.Reding Primary Care Provider: [name] Other Clinician: Referring Provider: Treating Provider/Extender:Izic Stfort, [name], [name] in Treatment: 11 Active Problems ICD-10 Evaluated Encounter Code Description Active Date Today Diagnosis I89.0 Lymphedema, not elsewhere classified 07/18/2019 No Yes S81.011D Laceration without foreign body, right knee, 07/18/2019 No Yes subsequent encounter L97.812 Non-pressure chronic ulcer of other part of right lower 07/18/2019 No Yes leg with fat layer exposed L97.811 Non-pressure chronic ulcer of other part of right lower 07/18/2019 No Yes leg limited to breakdown of skin I87.323 Chronic venous hypertension (idiopathic) with 07/18/2019 No Yes inflammation of bilateral lower extremity E11.622 Type 2 diabetes mellitus with other skin ulcer 07/18/2019 No Yes Inactive Problems Resolved Problems Electronic Signature(s) Signed: 10/03/2019 5:01:00 PM By: Linton Ham MD Entered By: Linton Ham on 10/03/2019 13:41:51 -------------------------------------------------------------------------------- Progress Note Details Patient Name: Date of Service: [name] 10/03/2019 12:30 PM Medical Record [id number] Patient Account Number: [id number] Date of Birth/Sex: Treating RN: 1946-02-05 (73 y.o. M) Primary Care Provider: [name] Other Clinician: Referring Provider: Treating Provider/Extender:Saliha Salts, [name], [name] in Treatment: 11 Subjective History of Present Illness (HPI) ADMISSION 07/18/2019 Patient is a 73 year old man with type 2 diabetes and also chronic  edema. He had a fall in July. Fairly substantial wound on the right medial knee and across the top of the patella. This is actually made some progress since then. He was seen in the ED on 8/18 with cellulitis. He was treated with doxycycline. This got extended I think for as long as 30 days. On 9/15 he was seen by his primary felt to have cellulitis with open wounds. He currently has 7 wounds the remnant of the initial contusion injury on the right medial knee which actually I think is actually quite a bit smaller than it was looking at a picture on his wife's cell phone. He also has 6 is wounds on the right lower extremity which includes 2 on the right anterior 3 on the right lateral and one posteriorly. He has compression stockings but has not been wearing them. He says these latter 6 wounds started as blisters that open. He has been using Neosporin. 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 He also has a laceration wound on the lower medial left thigh just above the knee. We have been using Santyl on the latter, silver alginate on the wounds on the right. The patient does not have an open wound on the left and he has a lot less edema. On the left side. We have given him the number for elastic therapy in [place] for 20/30 stockings on the left leg. I am not certain that that will suffice on the right however 10/13; we received an urgent call from home health this morning reporting that the patient's right leg had a dusky color. We brought him into the clinic for review. He has severe bilateral  right greater than left chronic venous  insufficiency with secondary lymphedema and has discoloration on the right leg chronically related to this. He also reports he wrapped his upper leg with an Ace wrap and may have done this with excessive tension 10/22. The patient still has a clean wound on the upper anterior thigh the remanent of a very large traumatic wound. Small wound in the right posterior calf in the setting of severe chronic venous hypertension. There is nothing open on the left leg and he has stockings 11/12; I think since the last time the patient was here the area on the traumatic wound just above his knee scabbed over and was felt to be healed. During the course of wound preparation today our intake nurse washed the scab off there is still an open wound here. Also still a small area on the posterior calf on the right in the setting of severe chronic venous hypertension. He has not been dressing the right thigh wound. Using silver alginate on the other wounds 12/3; 3-week follow-up he has. The area just above his knee anteriorly is improved however he has new areas on the right lateral knee and an area on the right lateral calf. I am not sure how this happened or when. The patient was not even sure when this happened. 12/17; the large area above his knee anteriorly and medially is completely closed today. The new areas from last time included the right lateral knee and the right lateral calf. The right lateral calf is healed. He still has small superficial areas on the right medial knee Objective Constitutional Sitting or standing Blood Pressure is within target range for patient.. Pulse regular and within target range for patient.Marland Kitchen Respirations regular, non-labored and within target range.. Temperature is normal and within the target range for the patient.Marland Kitchen Appears in no distress. Vitals Time Taken: 12:35 PM, Height: 77 in, Weight: 350 lbs, BMI: 41.5, Temperature: 98.3 F,  Pulse: 52 bpm, Respiratory Rate: 18 breaths/min, Blood Pressure: 128/55 mmHg. Eyes Conjunctivae clear. No discharge.no icterus. Respiratory work of breathing is normal. Cardiovascular Pedal pulses palpable and strong bilaterally.. Severe chronic venous insufficiency but edema is controlled. Psychiatric appears at normal baseline. General Notes: Wound exam; the areas on the right medial knee superficial areas remain. Everything else is healed. The patient has severe chronic venous insufficiency. Integumentary (Hair, Skin) Large amount of hemosiderin deposition. Wound #1 status is Open. Original cause of wound was Trauma. The wound is located on the Right,Medial Knee. The wound measures 0cm length x 0cm width x 0cm depth; 0cm^2 area and 0cm^3 volume. There is no tunneling or undermining noted. There is a none present amount of drainage noted. The wound margin is flat and intact. There is no granulation within the wound bed. There is no necrotic tissue within the wound bed. Wound #8 status is Open. Original cause of wound was Blister. The wound is located on the Right,Lateral Knee. The wound measures 3.5cm length x 3.4cm width x 0.1cm depth; 9.346cm^2 area and 0.935cm^3 volume. There is Fat Layer (Subcutaneous Tissue) Exposed exposed. There is no tunneling or undermining noted. There is a small amount of serosanguineous drainage noted. The wound margin is distinct with the outline attached to the wound base. There is large (67-100%) red, pink granulation within the wound bed. Wound #9 status is Open. Original cause of wound was Blister. The wound is located on the Right,Lateral Lower Leg. The wound measures 0cm length x 0cm width x 0cm depth; 0cm^2 area and 0cm^3 volume. There is no tunneling  or undermining noted. There is a none present amount of drainage noted. The wound margin is distinct with the outline attached to the wound base. There is no granulation within the wound bed. There is no  necrotic tissue within the wound bed. 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 Dressing Change Frequency: Change Dressing every other day. - home health to change once a week. wife to change all other days. Skin Barriers/Peri-Wound Care: Moisturizing lotion - lotion to both legs at night. Wound Cleansing: May shower and wash wound with soap and water. - with dressing changes. Primary Wound Dressing: Wound #8 Right,Lateral Knee: Silver Collagen - moisten with saline or [place] Jelly Secondary Dressing: Wound #8 Right,Lateral Knee: ABD pad - or self-adhesive foam border. Edema Control: Avoid standing for long periods of time Elevate legs to the level of the heart or above for 30 minutes daily and/or when sitting, a frequency of: - throughout the day. Exercise regularly Support Garment 20-30 mm/Hg pressure to: - Patient to apply in the morning and remove at night. Apply compression stockings to both legs. Home Health: [facility] for wound care. [facility] to change once a week. wound center to change on Thursdays. 1. Silver collagen moistened with saline to continue to these areas ABD. 2. I will see him back in 2 weeks should be healed by that time 3. We allowed him to put his stocking on the right leg reminded him to moisturize his skin nightly Electronic Signature(s) Signed: 10/03/2019 5:01:00 PM By: Linton Ham MD Entered By: Linton Ham on 10/03/2019  13:46:44 -------------------------------------------------------------------------------- SuperBill Details Patient Name: Date of Service: [name] 10/03/2019 Medical Record [id number] Patient Account Number: [id number] Date of Birth/Sex: Treating RN: 08-19-1946 (73 y.o. M) Primary Care Provider: [name] Other Clinician: Referring Provider: Treating Provider/Extender:Parnika Tweten, [name], [name] in Treatment: 11 Diagnosis Coding ICD-10 Codes Code Description I89.0 Lymphedema, not elsewhere classified S81.011D Laceration without foreign body, right knee, subsequent encounter L97.812 Non-pressure chronic ulcer of other part of right lower leg with fat layer exposed L97.811 Non-pressure chronic ulcer of other part of right lower leg limited to breakdown of skin I87.323 Chronic venous hypertension (idiopathic) with inflammation of bilateral lower extremity E11.622 Type 2 diabetes mellitus with other skin ulcer Facility Procedures The patient participates with Medicare or their insurance follows the Medicare Facility Guidelines: CPT4 Code Description Modifier Quantity [id number] [facility] VISIT-LEV 3 EST PT 1 Physician Procedures CPT4 Code Description: [id number] - WC PHYS LEVEL 3 - EST PT ICD-10 Diagnosis Description S81.011D Laceration without foreign body, right knee, subsequent L97.812 Non-pressure chronic ulcer of other part of right lower I89.0 Lymphedema, not elsewhere  classified Modifier: encounter leg with fat layer Quantity: 1 exposed Electronic Signature(s) Signed: 10/03/2019 5:01:00 PM By: Linton Ham MD Signed: 10/03/2019 5:34:18 PM By: Deon Pilling Entered By: Deon Pilling on 10/03/2019 14:22:53

## 2019-10-07 ENCOUNTER — Encounter: Payer: Self-pay | Admitting: Family Medicine

## 2019-10-07 ENCOUNTER — Other Ambulatory Visit: Payer: Self-pay

## 2019-10-07 ENCOUNTER — Ambulatory Visit (INDEPENDENT_AMBULATORY_CARE_PROVIDER_SITE_OTHER): Payer: Medicare Other | Admitting: Family Medicine

## 2019-10-07 VITALS — BP 120/62 | HR 64 | Temp 96.5°F | Wt 348.8 lb

## 2019-10-07 DIAGNOSIS — I110 Hypertensive heart disease with heart failure: Secondary | ICD-10-CM | POA: Diagnosis not present

## 2019-10-07 DIAGNOSIS — I87331 Chronic venous hypertension (idiopathic) with ulcer and inflammation of right lower extremity: Secondary | ICD-10-CM | POA: Diagnosis not present

## 2019-10-07 DIAGNOSIS — R609 Edema, unspecified: Secondary | ICD-10-CM

## 2019-10-07 DIAGNOSIS — I87302 Chronic venous hypertension (idiopathic) without complications of left lower extremity: Secondary | ICD-10-CM | POA: Diagnosis not present

## 2019-10-07 DIAGNOSIS — L03115 Cellulitis of right lower limb: Secondary | ICD-10-CM | POA: Insufficient documentation

## 2019-10-07 DIAGNOSIS — L97811 Non-pressure chronic ulcer of other part of right lower leg limited to breakdown of skin: Secondary | ICD-10-CM | POA: Diagnosis not present

## 2019-10-07 DIAGNOSIS — L97211 Non-pressure chronic ulcer of right calf limited to breakdown of skin: Secondary | ICD-10-CM | POA: Diagnosis not present

## 2019-10-07 MED ORDER — DOXYCYCLINE HYCLATE 100 MG PO CAPS: 100.0000 mg | ORAL_CAPSULE | Freq: Two times a day (BID) | ORAL | 0 refills | Status: DC

## 2019-10-07 MED ORDER — LEVOFLOXACIN 500 MG PO TABS: 500.0000 mg | ORAL_TABLET | Freq: Every day | ORAL | 0 refills | Status: DC

## 2019-10-07 NOTE — Progress Notes (Signed)
   Subjective:    Patient ID: [name], male    DOB: 11/13/1945, 73 y.o.   MRN: [id number]  HPI Here for a possible cellulitis in the right leg. We treated this aggressively earlier this year with antibiotics, and we referred him to the [facility]. They had been coming twice a week to wrap the leg with a special 4 layer wrap, and we were able to make the infection resolve. However last week they decided to stop the wraps and instead apply a  compression stocking, and now the leg is red and warm and tender again.  No fever.    Review of Systems  Constitutional: Negative.   Respiratory: Negative.   Cardiovascular: Negative.   Skin: Positive for color change.       Objective:   Physical Exam Constitutional:      Appearance: Normal appearance.  Cardiovascular:     Rate and Rhythm: Normal rate and regular rhythm.     Pulses: Normal pulses.     Heart sounds: Normal heart sounds.  Pulmonary:     Effort: Pulmonary effort is normal.     Breath sounds: Normal breath sounds.  Skin:    Comments: The right leg has 4+ edema. The skin is red and warm and tender in the foot and lower leg upa to a well defined rim below the knee  Neurological:     Mental Status: He is alert.           Assessment & Plan:  The cellulitis has returned, and I believe the transition from a wrap to a compression stocking has triggered this. We will treat him with 30 days of Doxycycline and Levaquin, and we will suggest the wound team return to the layered wraps they were using. Recheck in 3 weeks.  Alysia Penna, MD

## 2019-10-10 DIAGNOSIS — L03115 Cellulitis of right lower limb: Secondary | ICD-10-CM | POA: Diagnosis not present

## 2019-10-10 DIAGNOSIS — I87302 Chronic venous hypertension (idiopathic) without complications of left lower extremity: Secondary | ICD-10-CM | POA: Diagnosis not present

## 2019-10-10 DIAGNOSIS — I110 Hypertensive heart disease with heart failure: Secondary | ICD-10-CM | POA: Diagnosis not present

## 2019-10-10 DIAGNOSIS — L97211 Non-pressure chronic ulcer of right calf limited to breakdown of skin: Secondary | ICD-10-CM | POA: Diagnosis not present

## 2019-10-10 DIAGNOSIS — I87331 Chronic venous hypertension (idiopathic) with ulcer and inflammation of right lower extremity: Secondary | ICD-10-CM | POA: Diagnosis not present

## 2019-10-10 DIAGNOSIS — L97811 Non-pressure chronic ulcer of other part of right lower leg limited to breakdown of skin: Secondary | ICD-10-CM | POA: Diagnosis not present

## 2019-10-14 DIAGNOSIS — I110 Hypertensive heart disease with heart failure: Secondary | ICD-10-CM | POA: Diagnosis not present

## 2019-10-14 DIAGNOSIS — L03115 Cellulitis of right lower limb: Secondary | ICD-10-CM | POA: Diagnosis not present

## 2019-10-14 DIAGNOSIS — I87302 Chronic venous hypertension (idiopathic) without complications of left lower extremity: Secondary | ICD-10-CM | POA: Diagnosis not present

## 2019-10-14 DIAGNOSIS — L97811 Non-pressure chronic ulcer of other part of right lower leg limited to breakdown of skin: Secondary | ICD-10-CM | POA: Diagnosis not present

## 2019-10-14 DIAGNOSIS — I87331 Chronic venous hypertension (idiopathic) with ulcer and inflammation of right lower extremity: Secondary | ICD-10-CM | POA: Diagnosis not present

## 2019-10-14 DIAGNOSIS — L97211 Non-pressure chronic ulcer of right calf limited to breakdown of skin: Secondary | ICD-10-CM | POA: Diagnosis not present

## 2019-10-17 ENCOUNTER — Encounter (HOSPITAL_BASED_OUTPATIENT_CLINIC_OR_DEPARTMENT_OTHER): Payer: Medicare Other | Admitting: Internal Medicine

## 2019-10-20 DIAGNOSIS — G319 Degenerative disease of nervous system, unspecified: Secondary | ICD-10-CM | POA: Diagnosis not present

## 2019-10-20 DIAGNOSIS — E785 Hyperlipidemia, unspecified: Secondary | ICD-10-CM | POA: Diagnosis not present

## 2019-10-20 DIAGNOSIS — G4733 Obstructive sleep apnea (adult) (pediatric): Secondary | ICD-10-CM | POA: Diagnosis not present

## 2019-10-20 DIAGNOSIS — I890 Lymphedema, not elsewhere classified: Secondary | ICD-10-CM | POA: Diagnosis not present

## 2019-10-20 DIAGNOSIS — R32 Unspecified urinary incontinence: Secondary | ICD-10-CM | POA: Diagnosis not present

## 2019-10-20 DIAGNOSIS — I444 Left anterior fascicular block: Secondary | ICD-10-CM | POA: Diagnosis not present

## 2019-10-20 DIAGNOSIS — I87331 Chronic venous hypertension (idiopathic) with ulcer and inflammation of right lower extremity: Secondary | ICD-10-CM | POA: Diagnosis not present

## 2019-10-20 DIAGNOSIS — G9341 Metabolic encephalopathy: Secondary | ICD-10-CM | POA: Diagnosis not present

## 2019-10-20 DIAGNOSIS — E1140 Type 2 diabetes mellitus with diabetic neuropathy, unspecified: Secondary | ICD-10-CM | POA: Diagnosis not present

## 2019-10-20 DIAGNOSIS — L03115 Cellulitis of right lower limb: Secondary | ICD-10-CM | POA: Diagnosis not present

## 2019-10-20 DIAGNOSIS — I87302 Chronic venous hypertension (idiopathic) without complications of left lower extremity: Secondary | ICD-10-CM | POA: Diagnosis not present

## 2019-10-20 DIAGNOSIS — Z6841 Body Mass Index (BMI) 40.0 and over, adult: Secondary | ICD-10-CM | POA: Diagnosis not present

## 2019-10-20 DIAGNOSIS — L97211 Non-pressure chronic ulcer of right calf limited to breakdown of skin: Secondary | ICD-10-CM | POA: Diagnosis not present

## 2019-10-20 DIAGNOSIS — Z7984 Long term (current) use of oral hypoglycemic drugs: Secondary | ICD-10-CM | POA: Diagnosis not present

## 2019-10-20 DIAGNOSIS — I5022 Chronic systolic (congestive) heart failure: Secondary | ICD-10-CM | POA: Diagnosis not present

## 2019-10-20 DIAGNOSIS — I5030 Unspecified diastolic (congestive) heart failure: Secondary | ICD-10-CM | POA: Diagnosis not present

## 2019-10-20 DIAGNOSIS — I429 Cardiomyopathy, unspecified: Secondary | ICD-10-CM | POA: Diagnosis not present

## 2019-10-20 DIAGNOSIS — Z87891 Personal history of nicotine dependence: Secondary | ICD-10-CM | POA: Diagnosis not present

## 2019-10-20 DIAGNOSIS — Z7901 Long term (current) use of anticoagulants: Secondary | ICD-10-CM | POA: Diagnosis not present

## 2019-10-20 DIAGNOSIS — I482 Chronic atrial fibrillation, unspecified: Secondary | ICD-10-CM | POA: Diagnosis not present

## 2019-10-20 DIAGNOSIS — Z8572 Personal history of non-Hodgkin lymphomas: Secondary | ICD-10-CM | POA: Diagnosis not present

## 2019-10-20 DIAGNOSIS — F329 Major depressive disorder, single episode, unspecified: Secondary | ICD-10-CM | POA: Diagnosis not present

## 2019-10-20 DIAGNOSIS — I110 Hypertensive heart disease with heart failure: Secondary | ICD-10-CM | POA: Diagnosis not present

## 2019-10-20 DIAGNOSIS — L97811 Non-pressure chronic ulcer of other part of right lower leg limited to breakdown of skin: Secondary | ICD-10-CM | POA: Diagnosis not present

## 2019-10-21 DIAGNOSIS — L03115 Cellulitis of right lower limb: Secondary | ICD-10-CM | POA: Diagnosis not present

## 2019-10-21 DIAGNOSIS — L97811 Non-pressure chronic ulcer of other part of right lower leg limited to breakdown of skin: Secondary | ICD-10-CM | POA: Diagnosis not present

## 2019-10-21 DIAGNOSIS — I110 Hypertensive heart disease with heart failure: Secondary | ICD-10-CM | POA: Diagnosis not present

## 2019-10-21 DIAGNOSIS — I87331 Chronic venous hypertension (idiopathic) with ulcer and inflammation of right lower extremity: Secondary | ICD-10-CM | POA: Diagnosis not present

## 2019-10-21 DIAGNOSIS — I87302 Chronic venous hypertension (idiopathic) without complications of left lower extremity: Secondary | ICD-10-CM | POA: Diagnosis not present

## 2019-10-21 DIAGNOSIS — L97211 Non-pressure chronic ulcer of right calf limited to breakdown of skin: Secondary | ICD-10-CM | POA: Diagnosis not present

## 2019-10-24 ENCOUNTER — Telehealth: Payer: Self-pay | Admitting: Family Medicine

## 2019-10-24 DIAGNOSIS — L97811 Non-pressure chronic ulcer of other part of right lower leg limited to breakdown of skin: Secondary | ICD-10-CM | POA: Diagnosis not present

## 2019-10-24 DIAGNOSIS — I87331 Chronic venous hypertension (idiopathic) with ulcer and inflammation of right lower extremity: Secondary | ICD-10-CM | POA: Diagnosis not present

## 2019-10-24 DIAGNOSIS — I110 Hypertensive heart disease with heart failure: Secondary | ICD-10-CM | POA: Diagnosis not present

## 2019-10-24 DIAGNOSIS — L03115 Cellulitis of right lower limb: Secondary | ICD-10-CM | POA: Diagnosis not present

## 2019-10-24 DIAGNOSIS — L97211 Non-pressure chronic ulcer of right calf limited to breakdown of skin: Secondary | ICD-10-CM | POA: Diagnosis not present

## 2019-10-24 DIAGNOSIS — I87302 Chronic venous hypertension (idiopathic) without complications of left lower extremity: Secondary | ICD-10-CM | POA: Diagnosis not present

## 2019-10-24 NOTE — Telephone Encounter (Signed)
Pts nurse Langley Gauss from [facility] called and stated that the Pt fell last night and has no injury / Nurse is concerned due to this not being the first time he has fallen and she doesn't know why this is happening / Nurse thinks he needs to be seen

## 2019-10-24 NOTE — Telephone Encounter (Signed)
Message Routed to PCP CMA 

## 2019-10-25 ENCOUNTER — Ambulatory Visit: Payer: Self-pay

## 2019-10-25 ENCOUNTER — Encounter (HOSPITAL_BASED_OUTPATIENT_CLINIC_OR_DEPARTMENT_OTHER): Payer: Medicare Other | Attending: Internal Medicine | Admitting: Internal Medicine

## 2019-10-25 ENCOUNTER — Other Ambulatory Visit: Payer: Self-pay

## 2019-10-25 DIAGNOSIS — E1140 Type 2 diabetes mellitus with diabetic neuropathy, unspecified: Secondary | ICD-10-CM | POA: Insufficient documentation

## 2019-10-25 DIAGNOSIS — Z9221 Personal history of antineoplastic chemotherapy: Secondary | ICD-10-CM | POA: Insufficient documentation

## 2019-10-25 DIAGNOSIS — I872 Venous insufficiency (chronic) (peripheral): Secondary | ICD-10-CM | POA: Insufficient documentation

## 2019-10-25 DIAGNOSIS — I890 Lymphedema, not elsewhere classified: Secondary | ICD-10-CM | POA: Insufficient documentation

## 2019-10-25 DIAGNOSIS — G4730 Sleep apnea, unspecified: Secondary | ICD-10-CM | POA: Insufficient documentation

## 2019-10-25 DIAGNOSIS — I429 Cardiomyopathy, unspecified: Secondary | ICD-10-CM | POA: Diagnosis not present

## 2019-10-25 DIAGNOSIS — I110 Hypertensive heart disease with heart failure: Secondary | ICD-10-CM | POA: Diagnosis not present

## 2019-10-25 DIAGNOSIS — L97812 Non-pressure chronic ulcer of other part of right lower leg with fat layer exposed: Secondary | ICD-10-CM | POA: Insufficient documentation

## 2019-10-25 DIAGNOSIS — I5042 Chronic combined systolic (congestive) and diastolic (congestive) heart failure: Secondary | ICD-10-CM | POA: Diagnosis not present

## 2019-10-25 DIAGNOSIS — I482 Chronic atrial fibrillation, unspecified: Secondary | ICD-10-CM | POA: Diagnosis not present

## 2019-10-25 DIAGNOSIS — S80821A Blister (nonthermal), right lower leg, initial encounter: Secondary | ICD-10-CM | POA: Diagnosis not present

## 2019-10-25 DIAGNOSIS — E11622 Type 2 diabetes mellitus with other skin ulcer: Secondary | ICD-10-CM | POA: Diagnosis not present

## 2019-10-25 DIAGNOSIS — Z8572 Personal history of non-Hodgkin lymphomas: Secondary | ICD-10-CM | POA: Insufficient documentation

## 2019-10-25 DIAGNOSIS — S80221A Blister (nonthermal), right knee, initial encounter: Secondary | ICD-10-CM | POA: Diagnosis not present

## 2019-10-25 DIAGNOSIS — E1151 Type 2 diabetes mellitus with diabetic peripheral angiopathy without gangrene: Secondary | ICD-10-CM | POA: Insufficient documentation

## 2019-10-25 NOTE — Progress Notes (Addendum)
[name] ([id number]) Visit Report for 10/25/2019 HPI Details Patient Name: Date of Service: [name], [name] 10/25/2019 9:30 AM Medical Record [id number] Patient Account Number: [id number] Date of Birth/Sex: Treating RN: 1946-04-30 (74 y.o. M) Primary Care Provider: [name] Other Clinician: Referring Provider: Treating Provider/Extender:[name], [name], [name] in Treatment: 14 History of Present Illness HPI Description: ADMISSION 07/18/2019 Patient is a 74 year old man with type 2 diabetes and also chronic edema. He had a fall in July. Fairly substantial wound on the right medial knee and across the top of the patella. This is actually made some progress since then. He was seen in the ED on 8/18 with cellulitis. He was treated with doxycycline. This got extended I think for as long as 30 days. On 9/15 he was seen by his primary felt to have cellulitis with open wounds. He currently has 7 wounds the remnant of the initial contusion injury on the right medial knee which actually I think is actually quite a bit smaller than it was looking at a picture on his wife's cell phone. He also has 6 is wounds on the right lower extremity which includes 2 on the right anterior 3 on the right lateral and one posteriorly. He has compression stockings but has not been wearing them. He says these latter 6 wounds started as blisters that open. He has been using Neosporin. 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 He also has a laceration wound on the lower medial left  thigh just above the knee. We have been using Santyl on the latter, silver alginate on the wounds on the right. The patient does not have an open wound on the left and he has a lot less edema. On the left side. We have given him the number for elastic therapy in [place] for 20/30 stockings on the left leg. I am not certain that that will suffice on the right however 10/13; we received an urgent call from home health this morning reporting that the patient's right leg had a dusky color. We brought him into the clinic for review. He has severe bilateral right greater than left chronic venous insufficiency with secondary lymphedema and has discoloration on the right leg chronically related to this. He also reports he wrapped his upper leg with an Ace wrap and may have done this with excessive tension 10/22. The patient still has a clean wound on the upper anterior thigh the remanent of a very large traumatic wound. Small wound in the right posterior calf in the setting of severe chronic venous hypertension. There is nothing open on the left leg and he has stockings 11/12; I think since the last time the patient was here the area on the traumatic wound just above his knee scabbed over and was felt to be healed. During the course of wound preparation today our intake nurse washed the scab off there is still an open wound here. Also still a small area on the posterior calf on the right in the setting of severe chronic venous hypertension. He has not been dressing the right thigh wound. Using silver alginate on the other wounds 12/3; 3-week follow-up  he has. The area just above his knee anteriorly is improved however he has new areas on the right lateral knee and an area on the right lateral calf. I am not sure how this happened or when. The patient was not even sure when this happened. 12/17; the large area above his knee anteriorly and medially is completely closed today. The new areas from  last time included the right lateral knee and the right lateral calf. The right lateral calf is healed. He still has small superficial areas on the right medial knee 10/25/2019. We discharged the patient last time he was here. His wife states that within 4 days the swelling got to the point where she could not get the 20/30 compression stocking on the right leg. He subsequently opened up to a small open area medially. He is here for our review of that. He has his 20/30 below-knee stocking on the left which seems to be doing a decent job controlling the edema here. He has a large amount of edema in the right leg and we are fortunate that he only has a superficial medial wound. The patient saw his primary physician Dr. [name] on 12/17. He was felt to have cellulitis and put on Levaquin and doxy. Any degree of cellulitis he had then is now resolved Electronic Signature(s) Signed: 10/28/2019 6:04:26 PM By: Linton Ham MD Signed: 10/29/2019 6:18:44 PM By: Levan Hurst RN, BSN Previous Signature: 10/25/2019 4:35:49 PM Version By: Linton Ham MD Entered By: Levan Hurst on 10/28/2019 08:27:11 -------------------------------------------------------------------------------- Physical Exam Details Patient Name: Date of Service: [name], [name] 10/25/2019 9:30 AM Medical Record [id number] Patient Account Number: [id number] Date of Birth/Sex: Treating RN: 06/23/1946 (74 y.o. M) Primary Care Provider: [name] Other Clinician: Referring Provider: Treating Provider/Extender:[name], [name], [name] in Treatment: 14 Constitutional Sitting or standing Blood Pressure is within target range for patient.. Pulse regular and within target range for patient.Marland Kitchen Respirations regular, non-labored and within target range.. Temperature is normal and within the target range for the patient.Marland Kitchen Appears in no distress. Notes Wound exam; the area on the right medial knee that was  problematic last time from trauma remains healed. He has extreme edema with venous stasis in the right leg however he only has a small superficial wound on the right medial calf mid aspect. No evidence of cellulitis there is no tenderness no evidence of a DVT Electronic Signature(s) Signed: 10/25/2019 4:35:49 PM By: Linton Ham MD Entered By: Linton Ham on 10/25/2019 11:43:40 -------------------------------------------------------------------------------- Physician Orders Details Patient Name: Date of Service: [name] 10/25/2019 9:30 AM Medical Record [id number] Patient Account Number: [id number] Date of Birth/Sex: Treating RN: 1946-03-29 (74 y.o. [name] Primary Care Provider: Other Clinician: [name] Referring Provider: Treating Provider/Extender:Tressa Maldonado, [name], [name] in Treatment: 14 Verbal / Phone Orders: No Diagnosis Coding ICD-10 Coding Code Description I89.0 Lymphedema, not elsewhere classified S81.011D Laceration without foreign body, right knee, subsequent encounter L97.812 Non-pressure chronic ulcer of other part of right lower leg with fat layer exposed L97.811 Non-pressure chronic ulcer of other part of right lower leg limited to breakdown of skin I87.323 Chronic venous hypertension (idiopathic) with inflammation of bilateral lower extremity E11.622 Type 2 diabetes mellitus with other skin ulcer Follow-up Appointments Return Appointment in 1 week. Dressing Change Frequency Other: - change dressing twice a week Skin Barriers/Peri-Wound Care Moisturizing lotion - lotion to both legs at night. Wound Cleansing May shower and wash wound with soap and water. -  with dressing change Primary Wound Dressing Wound #10 Right,Medial Lower Leg Calcium Alginate with Silver Secondary Dressing Dry Gauze Edema Control 4 layer compression - Right Lower Extremity - use unna boot to upper leg Avoid standing for long periods  of time Elevate legs to the level of the heart or above for 30 minutes daily and/or when sitting, a frequency of: - throughout the day. Exercise regularly Support Garment 20-30 mm/Hg pressure to: - Patient to apply in the morning and remove at night. Apply compression stockings to left leg [facility] for wound care. [facility] to change once a week. wound center to change once and Home Health to change once Notes Will order Juxtalites bilaterally Electronic Signature(s) Signed: 10/25/2019 2:58:22 PM By: Kela Millin Signed: 10/25/2019 4:35:49 PM By: Linton Ham MD Entered By: Kela Millin on 10/25/2019 10:56:28 -------------------------------------------------------------------------------- Problem List Details Patient Name: Date of Service: [name] 10/25/2019 9:30 AM Medical Record [id number] Patient Account Number: [id number] Date of Birth/Sex: Treating RN: Sep 24, 1946 (74 y.o. [name] Primary Care Provider: [name] Other Clinician: Referring Provider: Treating Provider/Extender:Joy Haegele, [name], [name] in Treatment: 14 Active Problems ICD-10 Evaluated Encounter Code Description Active Date Today Diagnosis I89.0 Lymphedema, not elsewhere classified 07/18/2019 No Yes L97.811 Non-pressure chronic ulcer of other part of right lower 07/18/2019 No Yes leg limited to breakdown of skin I87.323 Chronic venous hypertension (idiopathic) with 07/18/2019 No Yes inflammation of bilateral lower extremity E11.622 Type 2 diabetes mellitus with other skin ulcer 07/18/2019 No Yes Inactive Problems ICD-10 Code Description Active Date Inactive Date S81.011D Laceration without foreign body, right knee, subsequent 07/18/2019 07/18/2019 encounter M19.622 Non-pressure chronic ulcer of other part of right lower leg with 07/18/2019 07/18/2019 fat layer exposed Resolved Problems Electronic  Signature(s) Signed: 10/25/2019 4:35:49 PM By: Linton Ham MD Entered By: Linton Ham on 10/25/2019 11:34:07 -------------------------------------------------------------------------------- Progress Note Details Patient Name: Date of Service: [name] 10/25/2019 9:30 AM Medical Record [id number] Patient Account Number: [id number] Date of Birth/Sex: Treating RN: May 04, 1946 (74 y.o. M) Primary Care Provider: [name] Other Clinician: Referring Provider: Treating Provider/Extender:Bryley Kovacevic, [name], [name] in Treatment: 14 Subjective History of Present Illness (HPI) ADMISSION 07/18/2019 Patient is a 74 year old man with type 2 diabetes and also chronic edema. He had a fall in July. Fairly substantial wound on the right medial knee and across the top of the patella. This is actually made some progress since then. He was seen in the ED on 8/18 with cellulitis. He was treated with doxycycline. This got extended I think for as long as 30 days. On 9/15 he was seen by his primary felt to have cellulitis with open wounds. He currently has 7 wounds the remnant of the initial contusion injury on the right medial knee which actually I think is actually quite a bit smaller than it was looking at a picture on his wife's cell phone. He also has 6 is wounds on the right lower extremity which includes 2 on the right anterior 3 on the right lateral and one posteriorly. He has compression stockings but has not been wearing them. He says these latter 6 wounds started as blisters that open. He has been using Neosporin. Past medical history; type 2 diabetes apparently well controlled, chronic atrial fibrillation, edema, systolic and diastolic congestive heart failure, cardiomyopathy, sleep apnea history of non-Hodgkin's lymphoma in his chest but he was treated with chemotherapy and did not use or receive radiation  ABI in our clinic was 1.14 on the right 10/8; patient  with bilateral right greater than left chronic venous insufficiency with secondary lymphedema. He also has a laceration wound on the lower medial left thigh just above the knee. We have been using Santyl on the latter, silver alginate on the wounds on the right. The patient does not have an open wound on the left and he has a lot less edema. On the left side. We have given him the number for elastic therapy in [place] for 20/30 stockings on the left leg. I am not certain that that will suffice on the right however 10/13; we received an urgent call from home health this morning reporting that the patient's right leg had a dusky color. We brought him into the clinic for review. He has severe bilateral right greater than left chronic venous insufficiency with secondary lymphedema and has discoloration on the right leg chronically related to this. He also reports he wrapped his upper leg with an Ace wrap and may have done this with excessive tension 10/22. The patient still has a clean wound on the upper anterior thigh the remanent of a very large traumatic wound. Small wound in the right posterior calf in the setting of severe chronic venous hypertension. There is nothing open on the left leg and he has stockings 11/12; I think since the last time the patient was here the area on the traumatic wound just above his knee scabbed over and was felt to be healed. During the course of wound preparation today our intake nurse washed the scab off there is still an open wound here. Also still a small area on the posterior calf on the right in the setting of severe chronic venous hypertension. He has not been dressing the right thigh wound. Using silver alginate on the other wounds 12/3; 3-week follow-up he has. The area just above his knee anteriorly is improved however he has new areas on the right lateral knee and an area on the right lateral calf. I am not sure how this happened or when. The patient was  not even sure when this happened. 12/17; the large area above his knee anteriorly and medially is completely closed today. The new areas from last time included the right lateral knee and the right lateral calf. The right lateral calf is healed. He still has small superficial areas on the right medial knee 10/25/2019. We discharged the patient last time he was here. His wife states that within 4 days the swelling got to the point where she could not get the 20/30 compression stocking on the right leg. He subsequently opened up to a small open area medially. He is here for our review of that. He has his 20/30 below-knee stocking on the left which seems to be doing a decent job controlling the edema here. He has a large amount of edema in the right leg and we are fortunate that he only has a superficial medial wound. The patient saw his primary physician Dr. [name] on 12/17. He was felt to have cellulitis and put on Levaquin and doxy. Any degree of cellulitis he had then is now resolved Objective Constitutional Sitting or standing Blood Pressure is within target range for patient.. Pulse regular and within target range for patient.Marland Kitchen Respirations regular, non-labored and within target range.. Temperature is normal and within the target range for the patient.Marland Kitchen Appears in no distress. Vitals Time Taken: 9:52 AM, Height: 77 in, Weight: 350 lbs, BMI: 41.5,  Temperature: 98.7 F, Pulse: 54 bpm, Respiratory Rate: 20 breaths/min, Blood Pressure: 106/70 mmHg. General Notes: Wound exam; the area on the right medial knee that was problematic last time from trauma remains healed. He has extreme edema with venous stasis in the right leg however he only has a small superficial wound on the right medial calf mid aspect. No evidence of cellulitis there is no tenderness no evidence of a DVT Integumentary (Hair, Skin) Wound #10 status is Open. Original cause of wound was Blister. The wound is located on the  Right,Medial Lower Leg. The wound measures 1cm length x 1cm width x 0.1cm depth; 0.785cm^2 area and 0.079cm^3 volume. There is no tunneling or undermining noted. There is a small amount of serous drainage noted. The wound margin is flat and intact. There is large (67-100%) pink granulation within the wound bed. There is no necrotic tissue within the wound bed. Wound #8 status is Healed - Epithelialized. Original cause of wound was Blister. The wound is located on the Right,Lateral Knee. The wound measures 0cm length x 0cm width x 0cm depth; 0cm^2 area and 0cm^3 volume. There is no tunneling or undermining noted. There is a none present amount of drainage noted. The wound margin is distinct with the outline attached to the wound base. There is no granulation within the wound bed. There is no necrotic tissue within the wound bed. Assessment Active Problems ICD-10 Lymphedema, not elsewhere classified Non-pressure chronic ulcer of other part of right lower leg limited to breakdown of skin Chronic venous hypertension (idiopathic) with inflammation of bilateral lower extremity Type 2 diabetes mellitus with other skin ulcer Procedures Wound #10 Pre-procedure diagnosis of Wound #10 is a Lymphedema located on the Right,Medial Lower Leg . There was a Four Layer Compression Therapy Procedure by Kela Millin, RN. Post procedure Diagnosis Wound #10: Same as Pre-Procedure Plan Follow-up Appointments: Return Appointment in 1 week. Dressing Change Frequency: Other: - change dressing twice a week Skin Barriers/Peri-Wound Care: Moisturizing lotion - lotion to both legs at night. Wound Cleansing: May shower and wash wound with soap and water. - with dressing change Primary Wound Dressing: Wound #10 Right,Medial Lower Leg: Calcium Alginate with Silver Secondary Dressing: Dry Gauze Edema Control: 4 layer compression - Right Lower Extremity - use unna boot to upper leg Avoid standing for long  periods of time Elevate legs to the level of the heart or above for 30 minutes daily and/or when sitting, a frequency of: - throughout the day. Exercise regularly Support Garment 20-30 mm/Hg pressure to: - Patient to apply in the morning and remove at night. Apply compression stockings to left leg Home Health: [facility] for wound care. [facility] to change once a week. wound center to change once and Home Health to change once General Notes: Will order Juxtalites bilaterally 1. Silver alginate over the wound area ABD under 4-layer compression with Unna boot to adhere the 4-layer. 2. He does not currently require antibiotics I can see no evidence of cellulitis 3. It is very clear that 20/30 below-knee stockings were in adequate for the amount of lymphedema that develops in the right leg. After discussion we have ordered him external compression stocking/garments. That way the compression can be adjusted according to need. He seems to require more compression on the right than the left leg 4. He tells me he has falls with what sounds to be a combination of diabetic peripheral neuropathy but perhaps autonomic neuropathy. He should have both  a lying and standing [not sitting] blood pressure and pulse. It is important in doing this test to keep measuring the blood pressure every minute until you reach the lowest possible point [nadir] Electronic Signature(s) Signed: 10/28/2019 6:04:26 PM By: Linton Ham MD Signed: 10/29/2019 6:18:44 PM By: Levan Hurst RN, BSN Previous Signature: 10/25/2019 4:35:49 PM Version By: Linton Ham MD Previous Signature: 10/25/2019 4:35:49 PM Version By: Linton Ham MD Entered By: Levan Hurst on 10/28/2019 08:27:21 -------------------------------------------------------------------------------- [place] Details Patient Name: Date of Service: [name], [name] 10/25/2019 Medical Record [id number] Patient  Account Number: [id number] Date of Birth/Sex: Treating RN: 1946/05/11 (74 y.o. [name] Primary Care Provider: [name] Other Clinician: Referring Provider: Treating Provider/Extender:Kallan Merrick, [name], [name] in Treatment: 14 Diagnosis Coding ICD-10 Codes Code Description I89.0 Lymphedema, not elsewhere classified S81.011D Laceration without foreign body, right knee, subsequent encounter L97.812 Non-pressure chronic ulcer of other part of right lower leg with fat layer exposed L97.811 Non-pressure chronic ulcer of other part of right lower leg limited to breakdown of skin I87.323 Chronic venous hypertension (idiopathic) with inflammation of bilateral lower extremity E11.622 Type 2 diabetes mellitus with other skin ulcer Facility Procedures The patient participates with Medicare or their insurance follows the Medicare Facility Guidelines: CPT4 Code Description Modifier Quantity [id number] (Facility Use Only) [phone] - APPLY [facility] RT LEG 1 Physician Procedures CPT4 Code Description: [id number] - WC PHYS LEVEL 3 - EST PT ICD-10 Diagnosis Description L97.811 Non-pressure chronic ulcer of other part of right lower leg skin I89.0 Lymphedema, not elsewhere classified Modifier: limited to breakdow Quantity: 1 n of Electronic Signature(s) Signed: 10/25/2019 4:35:49 PM By: Linton Ham MD Entered By: Linton Ham on 10/25/2019 11:48:00

## 2019-10-25 NOTE — Telephone Encounter (Signed)
Please advise. [place] for a virtual appointment or should this be in office?

## 2019-10-25 NOTE — Telephone Encounter (Signed)
  Incoming call from patient with complaint of occasionally being dizzy.  States when he get up or moves to fast  He get dizzy.  States it does not occur all the time.   Onset was last week.  Denies any other Sx.  Quote blood pressure as 116/54 Heart rate is 48. Has  occurred  Before was treated for vertigo. Encouraged patient to call back if Sx worsen .  Patient voiced understanding.             Reason for Disposition . [1] MODERATE dizziness (e.g., interferes with normal activities) AND [2] has been evaluated by physician for this  Answer Assessment - Initial Assessment Questions 1. DESCRIPTION: "Describe your dizziness."    Get up or move to fast.  Get dizzy 2. LIGHTHEADED: "Do you feel lightheaded?" (e.g., somewhat faint, woozy, weak upon standing)     *No Answer* 3. VERTIGO: "Do you feel like either you or the room is spinning or tilting?" (i.e. vertigo)     denies 4. SEVERITY: "How bad is it?"  "Do you feel like you are going to faint?" "Can you stand and walk?"   - MILD - walking normally   - MODERATE - interferes with normal activities (e.g., work, school)    - SEVERE - unable to stand, requires support to walk, feels like passing out now.      moderately 5. ONSET:  "When did the dizziness begin?"    Last week 6. AGGRAVATING FACTORS: "Does anything make it worse?" (e.g., standing, change in head position)     Cant move to fast 7. HEART RATE: "Can you tell me your heart rate?" "How many beats in 15 seconds?"  (Note: not all patients can do this)      116/54 HR 48 8. CAUSE: "What do you think is causing the dizziness?"     *No Answer* 9. RECURRENT SYMPTOM: "Have you had dizziness before?" If so, ask: "When was the last time?" "What happened that time?"  Yes Dr.  [name] gave patient something for vertigo     10. OTHER SYMPTOMS: "Do you have any other symptoms?" (e.g., fever, chest pain, vomiting, diarrhea, bleeding)      [id number]. PREGNANCY: "Is there any chance you are  pregnant?" "When was your last menstrual period?"      na  Protocols used: DIZZINESS [facility]

## 2019-10-25 NOTE — Telephone Encounter (Signed)
Message Routed to PCP CMA 

## 2019-10-27 NOTE — Telephone Encounter (Signed)
Up an in person OV to evaluate

## 2019-10-27 NOTE — Telephone Encounter (Signed)
Set up an in person OV to evaluate

## 2019-10-28 NOTE — Telephone Encounter (Signed)
Left message for patient to call back. CRM created 

## 2019-10-28 NOTE — Telephone Encounter (Signed)
See other phone note

## 2019-10-29 DIAGNOSIS — I87331 Chronic venous hypertension (idiopathic) with ulcer and inflammation of right lower extremity: Secondary | ICD-10-CM | POA: Diagnosis not present

## 2019-10-29 DIAGNOSIS — L97211 Non-pressure chronic ulcer of right calf limited to breakdown of skin: Secondary | ICD-10-CM | POA: Diagnosis not present

## 2019-10-29 DIAGNOSIS — L03115 Cellulitis of right lower limb: Secondary | ICD-10-CM | POA: Diagnosis not present

## 2019-10-29 DIAGNOSIS — I110 Hypertensive heart disease with heart failure: Secondary | ICD-10-CM | POA: Diagnosis not present

## 2019-10-29 DIAGNOSIS — I87302 Chronic venous hypertension (idiopathic) without complications of left lower extremity: Secondary | ICD-10-CM | POA: Diagnosis not present

## 2019-10-29 DIAGNOSIS — L97811 Non-pressure chronic ulcer of other part of right lower leg limited to breakdown of skin: Secondary | ICD-10-CM | POA: Diagnosis not present

## 2019-10-29 NOTE — Progress Notes (Signed)
BRALYN, FOLKERT ([id number]) Visit Report for 10/25/2019 Fall Risk Assessment Details Patient Name: Date of Service: DEOVION, BATREZ 10/25/2019 9:30 AM Medical Record [id number] Patient Account Number: [id number] Date of Birth/Sex: Treating RN: [birth year] (74 y.o. Janyth Contes Primary Care Shayana Hornstein: [name] Other Clinician: Referring Cooper Stamp: Treating Bee Marchiano/Extender:Robson, [name], [name] in Treatment: 14 Fall Risk Assessment Items Have you had 2 or more falls in the last 12 monthso 0 Yes Have you had any fall that resulted in injury in the last 12 monthso 0 Yes FALLS RISK SCREEN History of falling - immediate or within 3 months 25 Yes Secondary diagnosis (Do you have 2 or more medical diagnoseso) 0 No Ambulatory aid None/bed rest/wheelchair/nurse 0 No Crutches/cane/walker 15 Yes Furniture 0 No Intravenous therapy Access/Saline/Heparin Lock 0 No Weak (short steps with or without shuffle, stooped but able to lift head 0 No while walking, may seek support from furniture) Impaired (short steps with shuffle, may have difficulty arising from chair, 0 No head down, impaired balance) Mental Status Oriented to own ability 0 Yes Overestimates or forgets limitations 0 No Risk Level: Medium Risk Score: 40 Electronic Signature(s) Signed: 10/29/2019 6:18:44 PM By: Levan Hurst RN, BSN Entered By: Levan Hurst on 10/25/2019 [place]

## 2019-10-29 NOTE — Progress Notes (Signed)
[name] ([id number]) Visit Report for 10/25/2019 Arrival Information Details Patient Name: Date of Service: [name], [name] 10/25/2019 9:30 AM Medical Record [id number] Patient Account Number: [id number] Date of Birth/Sex: Treating RN: Dec 21, 1945 (74 y.o. [name] Primary Care Woodson Macha: [name] Other Clinician: Referring Charline Hoskinson: Treating Koray Soter/Extender:Robson, [name], [name] in Treatment: 14 Visit Information History Since Last Visit Added or deleted any medications: No Patient Arrived: [name] Any new allergies or adverse reactions: No Arrival Time: 09:46 Had a fall or experienced change in Yes Accompanied By: alone activities of daily living that may affect Transfer Assistance: None risk of falls: Patient Identification Verified: Yes Signs or symptoms of abuse/neglect since last No Secondary Verification Process Yes visito Completed: Hospitalized since last visit: No Patient Requires Transmission- No Implantable device outside of the clinic excluding No Based Precautions: cellular tissue based products placed in the center Patient Has Alerts: Yes since last visit: Patient Alerts: Patient on Blood Has Dressing in Place as Prescribed: No Thinner Pain Present Now: No Electronic Signature(s) Signed: 10/29/2019 6:18:44 PM By: Levan Hurst RN, BSN Entered By: Levan Hurst on 10/25/2019 09:47:36 -------------------------------------------------------------------------------- Compression Therapy Details Patient Name: Date of Service: [name] 10/25/2019 9:30 AM Medical Record [id number] Patient Account Number: [id number] Date of Birth/Sex: Treating RN: 1946-10-17 (74 y.o. [name] Primary Care Artia Singley: [name] Other Clinician: Referring Luise Yamamoto: Treating Lylith Bebeau/Extender:Robson, [name], [name] in Treatment: 14 Compression Therapy Performed for Wound Wound #10  Right,Medial Lower Leg Assessment: Performed By: Clinician Kela Millin, RN Compression Type: Four Layer Post Procedure Diagnosis Same as Pre-procedure Electronic Signature(s) Signed: 10/25/2019 2:58:22 PM By: Kela Millin Entered By: Kela Millin on 10/25/2019 10:52:26 -------------------------------------------------------------------------------- Encounter Discharge Information Details Patient Name: Date of Service: [name] 10/25/2019 9:30 AM Medical Record [id number] Patient Account Number: [id number] Date of Birth/Sex: Treating RN: Jun 28, 1946 (74 y.o. [name] Primary Care Freddy Kinne: [name] Other Clinician: Referring Jazariah Teall: Treating Jaqualyn Juday/Extender:Robson, [name], [name] in Treatment: 14 Encounter Discharge Information Items Discharge Condition: Stable Ambulatory Status: Walker Discharge Destination: Home Transportation: Private Auto Accompanied By: spouse Schedule Follow-up Appointment: Yes Clinical Summary of Care: Patient Declined Electronic Signature(s) Signed: 10/25/2019 3:29:16 PM By: Baruch Gouty RN, BSN Entered By: Baruch Gouty on 10/25/2019 11:12:56 -------------------------------------------------------------------------------- Lower Extremity Assessment Details Patient Name: Date of Service: [name], [name] 10/25/2019 9:30 AM Medical Record [id number] Patient Account Number: [id number] Date of Birth/Sex: Treating RN: 12-21-1945 (74 y.o. [name] Primary Care Westly Hinnant: [name] Other Clinician: Referring Veasna Santibanez: Treating Dannielle Baskins/Extender:Robson, [name], [name] in Treatment: 14 Edema Assessment Assessed: [Left: No] [Right: No] Edema: [Left: Ye] [Right: s] Calf Left: Right: Point of Measurement: 34 cm From Medial Instep cm 51 cm Ankle Left: Right: Point of Measurement: 13 cm From Medial Instep cm 29.2 cm Vascular Assessment Pulses: Dorsalis  Pedis Palpable: [Right:Yes] Electronic Signature(s) Signed: 10/29/2019 6:18:44 PM By: Levan Hurst RN, BSN Entered By: Levan Hurst on 10/25/2019 09:58:57 -------------------------------------------------------------------------------- Multi Wound Chart Details Patient Name: Date of Service: [name] 10/25/2019 9:30 AM Medical Record [id number] Patient Account Number: [id number] Date of Birth/Sex: Treating RN: 1945/11/03 (74 y.o. 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] Comorbid History: [10:Sleep Apnea, Congestive Sleep Apnea, Congestive N/A Heart Failure, Hypertension, Heart Failure, Hypertension, Type II Diabetes, Neuropathy, Received Chemotherapy] [8:Type II Diabetes, Neuropathy, Received Chemotherapy] Date Acquired: [10:10/03/2019] [8:09/19/2019] [N/A:N/A] Weeks of Treatment: [10:0] [8:5] [N/A:N/A] Wound Status: [10:Open] [8:Healed - Epithelialized] [N/A:N/A] Clustered Wound: [10:No] [8:Yes] [N/A:N/A] Clustered Quantity: [10:N/A] [8:3] [N/A:N/A] Measurements L x W x D 1x1x0.1 [8:0x0x0] [N/A:N/A] (cm) Area (cm) : [10:0.785] [8:0] [N/A:N/A] Volume (cm) : [10:0.079] [8:0] [N/A:N/A] % Reduction in Area: [10:0.00%] [8:100.00%] [N/A:N/A] % Reduction in Volume: [10:0.00%] [8:100.00%] [N/A:N/A] Classification: [10:Full Thickness Without Exposed Support Structures] [8:Grade 2] [N/A:N/A] Exudate Amount:  [10:Small] [8:None Present] [N/A:N/A] Exudate Type: [10:Serous] [8:N/A] [N/A:N/A] Exudate Color: [10:amber] [8:N/A] [N/A:N/A] Wound Margin: [10:Flat and Intact] [8:Distinct, outline attached] [N/A:N/A] Granulation Amount: [10:Large (67-100%)] [8:None Present (0%)] [N/A:N/A] Granulation Quality: [10:Pink] [8:N/A] [N/A:N/A] Necrotic Amount: [10:None Present (0%)] [8:None Present (0%)] [N/A:N/A] Exposed Structures: [10:Fascia: No Fat Layer (Subcutaneous Fat Layer (Subcutaneous Tissue) Exposed: No Tendon: No Muscle: No Joint: No Bone: No] [8:Fascia: No Tissue) Exposed: No Tendon: No Muscle: No Joint: No Bone: No] [N/A:N/A] Epithelialization: [10:None Compression Therapy] [8:Large (67-100%) N/A] [N/A:N/A N/A] Treatment Notes Wound #10 (Right, Medial Lower Leg) 2. Periwound Care Moisturizing lotion 3. Primary Dressing Applied Calcium Alginate Ag 4. Secondary Dressing Dry Gauze 6. Support Layer Applied 4 layer compression Water quality scientist) Signed: 10/25/2019 4:35:49 PM By: Linton Ham MD Entered By: Linton Ham on 10/25/2019 11:37:43 -------------------------------------------------------------------------------- Multi-Disciplinary Care Plan Details Patient Name: Date of Service: [name] 10/25/2019 9:30 AM Medical Record [id number] Patient Account Number: [id number] Date of Birth/Sex: Treating RN: 1945/12/07 (74 y.o. [name] Primary Care Takeem Krotzer: [name] Other Clinician: Referring Damin Salido: Treating Cardelia Sassano/Extender:Robson, [name], [name] in Treatment: 14 Active Inactive Nutrition Nursing Diagnoses: Potential for alteratiion in Nutrition/Potential for imbalanced nutrition Goals: Patient/caregiver agrees to and verbalizes understanding of need to obtain nutritional consultation Date Initiated: 07/18/2019 Target Resolution Date: 11/22/2019 Goal Status: Active Interventions: Assess patient nutrition upon admission  and as needed per policy Provide education on nutrition Treatment Activities: Education provided on Nutrition : 08/29/2019 Patient referred to Primary Care Physician for further nutritional evaluation : 07/18/2019 Notes: Pain, Acute or Chronic Nursing Diagnoses: Pain, acute or chronic: actual or potential Potential alteration in comfort, pain Goals: Patient will verbalize adequate pain control and receive pain control interventions during procedures as needed Date Initiated: 07/18/2019 Target Resolution Date: 11/22/2019 Goal Status: Active Patient/caregiver will verbalize comfort level met Date Initiated: 07/18/2019 Date Inactivated: 08/08/2019 Target Resolution Date: 08/23/2019 Goal Status: Met Interventions: Encourage patient to take pain medications as prescribed Provide education on pain management Reposition patient for comfort Treatment Activities: Administer pain control measures as ordered : 07/18/2019 Notes: Electronic Signature(s) Signed: 10/25/2019 2:58:22 PM By: Kela Millin Entered By: Kela Millin on 10/25/2019 09:32:31 -------------------------------------------------------------------------------- Pain Assessment Details Patient Name: Date of Service: [name] 10/25/2019 9:30 AM Medical Record [id number] Patient Account Number: [id number] Date of Birth/Sex: Treating RN: 1946-08-28 (74 y.o. [name] Primary Care Harvel Meskill: Other Clinician: [name] Referring Ekam Bonebrake: Treating Ellene Bloodsaw/Extender:Robson, [name], [name] in Treatment: 14 Active Problems Location of Pain Severity and Description of Pain Patient Has Paino No Site Locations Pain Management and Medication Current Pain Management: Electronic Signature(s) Signed: 10/29/2019 6:18:44 PM By: Levan Hurst RN, BSN Entered By: Levan Hurst on 10/25/2019  09:48:12 -------------------------------------------------------------------------------- Patient/Caregiver Education Details Patient Name:  Date of Service: [name], [name] 1/8/2021andnbsp9:30 AM Medical Record [id number] Patient Account Number: [id number] Date of Birth/Gender: Treating RN: [birth year] (74 y.o. [name] Primary Care Physician: [name] Other Clinician: Referring Physician: Treating Physician/Extender:Robson, [name], [name] in Treatment: 14 Education Assessment Education Provided To: Patient Education Topics Provided Nutrition: Methods: Explain/Verbal Responses: State content correctly Pain: Methods: Explain/Verbal Responses: State content correctly Electronic Signature(s) Signed: 10/25/2019 2:58:22 PM By: Kela Millin Entered By: Kela Millin on 10/25/2019 09:32:47 -------------------------------------------------------------------------------- Wound Assessment Details Patient Name: Date of Service: [name] 10/25/2019 9:30 AM Medical Record [id number] Patient Account Number: [id number] Date of Birth/Sex: Treating RN: Mar 07, 1946 (74 y.o. [name] Primary Care Kenisha Lynds: [name] Other Clinician: Referring Verlean Allport: Treating Taytum Scheck/Extender:Robson, [name], [name] in Treatment: 14 Wound Status Wound Number: 10 Primary Venous Leg Ulcer Etiology: Wound Location: Right Lower Leg - Medial Wound Open Wounding Event: Blister Status: Date Acquired: 10/03/2019 Comorbid Sleep Apnea, Congestive Heart Failure, Weeks Of Treatment: 0 History: Hypertension, Type II Diabetes, Neuropathy, Clustered Wound: No Received Chemotherapy Photos Wound Measurements Length: (cm) 1 % Reduct Width: (cm) 1 % Reduct Depth: (cm) 0.1 Epitheli Area: (cm) 0.785 Tunneli Volume: (cm) 0.079 Undermi Wound Description Full Thickness Without Exposed Support Foul Odo Classification:  Structures Slough/F Wound Flat and Intact Margin: Exudate Small Amount: Exudate Serous Type: Exudate Exudate amber Color: Wound Bed Granulation Amount: Large (67-100%) Granulation Quality: Pink Fascia Exp Necrotic Amount: None Present (0%) Fat Layer Tendon Exp Muscle Exp Joint Expo Bone Expos r After Cleansing: No ibrino No Exposed Structure osed: No (Subcutaneous Tissue) Exposed: No osed: No osed: No sed: No ed: No ion in Area: 0% ion in Volume: 0% alization: None ng: No ning: No Treatment Notes Wound #10 (Right, Medial Lower Leg) 2. Periwound Care Moisturizing lotion 3. Primary Dressing Applied Calcium Alginate Ag 4. Secondary Dressing Dry Gauze 6. Support Layer Applied 4 layer compression Water quality scientist) Signed: 10/29/2019 3:22:35 PM By: Mikeal Hawthorne EMT/HBOT Signed: 10/29/2019 6:18:44 PM By: Levan Hurst RN, BSN Entered By: Mikeal Hawthorne on 10/29/2019 13:24:47 -------------------------------------------------------------------------------- Wound Assessment Details Patient Name: Date of Service: [name] 10/25/2019 9:30 AM Medical Record [id number] Patient Account Number: [id number] Date of Birth/Sex: Treating RN: 06/01/1946 (73 y.o. [name] Primary Care Crixus Mcaulay: [name] Other Clinician: Referring Fara Worthy: Treating Airi Copado/Extender:Robson, [name], [name] in Treatment: 14 Wound Status Wound Number: 8 Primary Diabetic Wound/Ulcer of the Lower Extremity Etiology: Wound Location: Right Knee - Lateral Wound Healed - Epithelialized Wounding Event: Blister Status: Date Acquired: 09/19/2019 Comorbid Sleep Apnea, Congestive Heart Failure, Weeks Of Treatment: 5 History: Hypertension, Type II Diabetes, Neuropathy, Clustered Wound: Yes Received Chemotherapy Photos Wound Measurements Length: (cm) 0 % Reduction Width: (cm) 0 % Reduction Depth: (cm) 0 Epithelializ Clustered Quantity:  3 Tunneling: Area: (cm) 0 Undermining Volume: (cm) 0 Wound Description Classification: Grade 2 Foul Odor Af Wound Margin: Distinct, outline attached Slough/Fibri Exudate Amount: None Present Wound Bed Granulation Amount: None Present (0%) Necrotic Amount: None Present (0%) Fascia Expos Fat Layer (S Tendon Expos Muscle Expos Joint Expose Bone Exposed ter Cleansing: No no No Exposed Structure ed: No ubcutaneous Tissue) Exposed: No ed: No ed: No d: No : No in Area: 100% in Volume: 100% ation: Large (67-100%) No : No Electronic Signature(s) Signed: 10/29/2019 3:22:35 PM By: Mikeal Hawthorne EMT/HBOT Signed: 10/29/2019 6:18:44 PM By: Levan Hurst RN, BSN Entered By: Mikeal Hawthorne on 10/29/2019 13:25:10 -------------------------------------------------------------------------------- Vitals Details Patient Name: Date  of Service: [name], [name] 10/25/2019 9:30 AM Medical Record [id number] Patient Account Number: [id number] Date of Birth/Sex: Treating RN: 12/28/1945 (74 y.o. [name] Primary Care Franklin Baumbach: [name] Other Clinician: Referring Quinley Nesler: Treating Tkeya Stencil/Extender:Robson, [name], [name] in Treatment: 14 Vital Signs Time Taken: 09:52 Temperature (F): 98.7 Height (in): 77 Pulse (bpm): 54 Weight (lbs): 350 Respiratory Rate (breaths/min): 20 Body Mass Index (BMI): 41.5 Blood Pressure (mmHg): 106/70 Reference Range: 80 - 120 mg / dl Electronic Signature(s) Signed: 10/29/2019 6:18:44 PM By: Levan Hurst RN, BSN Entered By: Levan Hurst on 10/25/2019 09:53:43

## 2019-10-31 NOTE — Telephone Encounter (Signed)
Left message for patient to call back  

## 2019-11-01 ENCOUNTER — Other Ambulatory Visit: Payer: Self-pay

## 2019-11-01 ENCOUNTER — Encounter (HOSPITAL_BASED_OUTPATIENT_CLINIC_OR_DEPARTMENT_OTHER): Payer: Medicare Other | Admitting: Internal Medicine

## 2019-11-01 ENCOUNTER — Ambulatory Visit: Payer: Medicare Other | Admitting: Family Medicine

## 2019-11-01 DIAGNOSIS — L97212 Non-pressure chronic ulcer of right calf with fat layer exposed: Secondary | ICD-10-CM | POA: Diagnosis not present

## 2019-11-01 DIAGNOSIS — I890 Lymphedema, not elsewhere classified: Secondary | ICD-10-CM | POA: Diagnosis not present

## 2019-11-01 DIAGNOSIS — E11622 Type 2 diabetes mellitus with other skin ulcer: Secondary | ICD-10-CM | POA: Diagnosis not present

## 2019-11-01 DIAGNOSIS — I87311 Chronic venous hypertension (idiopathic) with ulcer of right lower extremity: Secondary | ICD-10-CM | POA: Diagnosis not present

## 2019-11-01 DIAGNOSIS — I872 Venous insufficiency (chronic) (peripheral): Secondary | ICD-10-CM | POA: Diagnosis not present

## 2019-11-01 DIAGNOSIS — E1140 Type 2 diabetes mellitus with diabetic neuropathy, unspecified: Secondary | ICD-10-CM | POA: Diagnosis not present

## 2019-11-01 DIAGNOSIS — E1151 Type 2 diabetes mellitus with diabetic peripheral angiopathy without gangrene: Secondary | ICD-10-CM | POA: Diagnosis not present

## 2019-11-01 DIAGNOSIS — L97812 Non-pressure chronic ulcer of other part of right lower leg with fat layer exposed: Secondary | ICD-10-CM | POA: Diagnosis not present

## 2019-11-01 NOTE — Progress Notes (Signed)
[name] ([id number]) Visit Report for 11/01/2019 HPI Details Patient Name: Date of Service: [name], [name] 11/01/2019 1:15 PM Medical Record [id number] Patient Account Number: [id number] Date of Birth/Sex: Treating RN: 08-26-1946 (74 y.o. M) Primary Care Provider: [name] Other Clinician: Referring Provider: Treating Provider/Extender:Jamyah Folk, [name], [name] in Treatment: 15 History of Present Illness HPI Description: ADMISSION 07/18/2019 Patient is a 74 year old man with type 2 diabetes and also chronic edema. He had a fall in July. Fairly substantial wound on the right medial knee and across the top of the patella. This is actually made some progress since then. He was seen in the ED on 8/18 with cellulitis. He was treated with doxycycline. This got extended I think for as long as 30 days. On 9/15 he was seen by his primary felt to have cellulitis with open wounds. He currently has 7 wounds the remnant of the initial contusion injury on the right medial knee which actually I think is actually quite a bit smaller than it was looking at a picture on his wife's cell phone. He also has 6 is wounds on the right lower extremity which includes 2 on the right anterior 3 on the right lateral and one posteriorly. He has compression stockings but has not been wearing them. He says these latter 6 wounds started as blisters that open. He has been using Neosporin. 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 He also has a laceration wound on the lower medial left  thigh just above the knee. We have been using Santyl on the latter, silver alginate on the wounds on the right. The patient does not have an open wound on the left and he has a lot less edema. On the left side. We have given him the number for elastic therapy in [place] for 20/30 stockings on the left leg. I am not certain that that will suffice on the right however 10/13; we received an urgent call from home health this morning reporting that the patient's right leg had a dusky color. We brought him into the clinic for review. He has severe bilateral right greater than left chronic venous insufficiency with secondary lymphedema and has discoloration on the right leg chronically related to this. He also reports he wrapped his upper leg with an Ace wrap and may have done this with excessive tension 10/22. The patient still has a clean wound on the upper anterior thigh the remanent of a very large traumatic wound. Small wound in the right posterior calf in the setting of severe chronic venous hypertension. There is nothing open on the left leg and he has stockings 11/12; I think since the last time the patient was here the area on the traumatic wound just above his knee scabbed over and was felt to be healed. During the course of wound preparation today our intake nurse washed the scab off there is still an open wound here. Also still a small area on the posterior calf on the right in the setting of severe chronic venous hypertension. He has not been dressing the right thigh wound. Using silver alginate on the other wounds 12/3; 3-week follow-up  he has. The area just above his knee anteriorly is improved however he has new areas on the right lateral knee and an area on the right lateral calf. I am not sure how this happened or when. The patient was not even sure when this happened. 12/17; the large area above his knee anteriorly and medially is completely closed today. The new areas from  last time included the right lateral knee and the right lateral calf. The right lateral calf is healed. He still has small superficial areas on the right medial knee 10/25/2019. We discharged the patient last time he was here. His wife states that within 4 days the swelling got to the point where she could not get the 20/30 compression stocking on the right leg. He subsequently opened up to a small open area medially. He is here for our review of that. He has his 20/30 below-knee stocking on the left which seems to be doing a decent job controlling the edema here. He has a large amount of edema in the right leg and we are fortunate that he only has a superficial medial wound. The patient saw his primary physician Dr. [name] on 12/17. He was felt to have cellulitis and put on Levaquin and doxy. Any degree of cellulitis he had then is now resolved 1/15; the patient is just about healed. Small open area remaining on the right leg we will put him back in compression. There is nothing on the left leg he has a loose fitting 20/30 stocking for the left leg. I think he is going to need better than this bilaterally if not currently then soon we will go ahead and order him bilateral external compression garments Electronic Signature(s) Signed: 11/01/2019 6:24:11 PM By: Linton Ham MD Entered By: Linton Ham on 11/01/2019 18:21:52 -------------------------------------------------------------------------------- Physical Exam Details Patient Name: Date of Service: [name] 11/01/2019 1:15 PM Medical Record [id number] Patient Account Number: [id number] Date of Birth/Sex: Treating RN: 1946-07-06 (74 y.o. M) Primary Care Provider: [name] Other Clinician: Referring Provider: Treating Provider/Extender:Jaylon Grode, [name], [name] in Treatment: 15 Constitutional Patient is hypotensive. However he appears well. Pulse regular and within target range for patient.Marland Kitchen  Respirations regular, non-labored and within target range.. Temperature is normal and within the target range for the patient.Marland Kitchen Appears in no distress. Cardiovascular It will pulses are palpable on the left. Notes Wound examination the area on the right medial knee is still closed. Small open area on the right medial lower leg control here is good there is no recurrence of cellulitis no evidence of a DVT. There is nothing open on the left leg Electronic Signature(s) Signed: 11/01/2019 6:24:11 PM By: Linton Ham MD Entered By: Linton Ham on 11/01/2019 18:22:46 -------------------------------------------------------------------------------- Physician Orders Details Patient Name: Date of Service: [name] 11/01/2019 1:15 PM Medical Record [id number] Patient Account Number: [id number] Date of Birth/Sex: Treating RN: Nov 07, 1945 (74 y.o. [name] Primary Care Provider: [name] Other Clinician: Referring Provider: Treating Provider/Extender:Daveion Robar, [name], [name] in Treatment: 15 Verbal / Phone Orders: No Diagnosis Coding ICD-10 Coding Code Description I89.0 Lymphedema, not elsewhere classified L97.811 Non-pressure chronic ulcer of other part of right lower leg limited to breakdown of skin I87.323 Chronic venous hypertension (idiopathic) with inflammation of bilateral lower extremity E11.622 Type 2 diabetes mellitus with other skin ulcer Follow-up Appointments Return Appointment in 1 week. Dressing Change Frequency Do not change entire dressing for one week. Skin Barriers/Peri-Wound Care Moisturizing lotion - lotion  to both legs at night. Wound Cleansing May shower with protection. Edema Control 4 layer compression - Right Lower Extremity - use unna boot to upper leg Avoid standing for long periods of time Elevate legs to the level of the heart or above for 30 minutes daily and/or when sitting, a frequency of: - throughout  the day. Exercise regularly Support Garment 20-30 mm/Hg pressure to: - Patient to apply in the morning and remove at night. Apply compression stockings to left leg Home Health Discontinue home health - Discontinue [facility], [place] Electronic Signature(s) Signed: 11/01/2019 5:57:08 PM By: Kela Millin Signed: 11/01/2019 6:24:11 PM By: Linton Ham MD Entered By: Kela Millin on 11/01/2019 15:10:21 -------------------------------------------------------------------------------- Problem List Details Patient Name: Date of Service: LORI, LIEW 11/01/2019 1:15 PM Medical Record [id number] Patient Account Number: [id number] Date of Birth/Sex: Treating RN: Mar 07, 1946 (74 y.o. [name] Primary Care Provider: [name] Other Clinician: Referring Provider: Treating Provider/Extender:Hope Holst, [name], [name] in Treatment: 15 Active Problems ICD-10 Evaluated Encounter Code Description Active Date Today Diagnosis I89.0 Lymphedema, not elsewhere classified 07/18/2019 No Yes L97.811 Non-pressure chronic ulcer of other part of right lower 07/18/2019 No Yes leg limited to breakdown of skin I87.323 Chronic venous hypertension (idiopathic) with 07/18/2019 No Yes inflammation of bilateral lower extremity E11.622 Type 2 diabetes mellitus with other skin ulcer 07/18/2019 No Yes Inactive Problems ICD-10 Code Description Active Date Inactive Date S81.011D Laceration without foreign body, right knee, subsequent 07/18/2019 07/18/2019 encounter Z00.923 Non-pressure chronic ulcer of other part of right lower leg with 07/18/2019 07/18/2019 fat layer exposed Resolved Problems Electronic Signature(s) Signed: 11/01/2019 6:24:11 PM By: Linton Ham MD Previous Signature: 11/01/2019 5:57:08 PM Version By: Kela Millin Entered By: Linton Ham on 11/01/2019 18:21:04 -------------------------------------------------------------------------------- Progress Note  Details Patient Name: Date of Service: [name] 11/01/2019 1:15 PM Medical Record [id number] Patient Account Number: [id number] Date of Birth/Sex: Treating RN: April 21, 1946 (74 y.o. M) Primary Care Provider: [name] Other Clinician: Referring Provider: Treating Provider/Extender:Aneliz Carbary, [name], [name] in Treatment: 15 Subjective History of Present Illness (HPI) ADMISSION 07/18/2019 Patient is a 74 year old man with type 2 diabetes and also chronic edema. He had a fall in July. Fairly substantial wound on the right medial knee and across the top of the patella. This is actually made some progress since then. He was seen in the ED on 8/18 with cellulitis. He was treated with doxycycline. This got extended I think for as long as 30 days. On 9/15 he was seen by his primary felt to have cellulitis with open wounds. He currently has 7 wounds the remnant of the initial contusion injury on the right medial knee which actually I think is actually quite a bit smaller than it was looking at a picture on his wife's cell phone. He also has 6 is wounds on the right lower extremity which includes 2 on the right anterior 3 on the right lateral and one posteriorly. He has compression stockings but has not been wearing them. He says these latter 6 wounds started as blisters that open. He has been using Neosporin. 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 He also  has a laceration wound on the lower medial left thigh just above the knee. We have been using Santyl on the  latter, silver alginate on the wounds on the right. The patient does not have an open wound on the left and he has a lot less edema. On the left side. We have given him the number for elastic therapy in [place] for 20/30 stockings on the left leg. I am not certain that that will suffice on the right however 10/13; we received an urgent call from home health this morning reporting that the patient's right leg had a dusky color. We brought him into the clinic for review. He has severe bilateral right greater than left chronic venous insufficiency with secondary lymphedema and has discoloration on the right leg chronically related to this. He also reports he wrapped his upper leg with an Ace wrap and may have done this with excessive tension 10/22. The patient still has a clean wound on the upper anterior thigh the remanent of a very large traumatic wound. Small wound in the right posterior calf in the setting of severe chronic venous hypertension. There is nothing open on the left leg and he has stockings 11/12; I think since the last time the patient was here the area on the traumatic wound just above his knee scabbed over and was felt to be healed. During the course of wound preparation today our intake nurse washed the scab off there is still an open wound here. Also still a small area on the posterior calf on the right in the setting of severe chronic venous hypertension. He has not been dressing the right thigh wound. Using silver alginate on the other wounds 12/3; 3-week follow-up he has. The area just above his knee anteriorly is improved however he has new areas on the right lateral knee and an area on the right lateral calf. I am not sure how this happened or when. The patient was not even sure when this happened. 12/17; the large area above his knee anteriorly and medially is completely closed today. The new areas from last time included the right lateral knee and the right lateral  calf. The right lateral calf is healed. He still has small superficial areas on the right medial knee 10/25/2019. We discharged the patient last time he was here. His wife states that within 4 days the swelling got to the point where she could not get the 20/30 compression stocking on the right leg. He subsequently opened up to a small open area medially. He is here for our review of that. He has his 20/30 below-knee stocking on the left which seems to be doing a decent job controlling the edema here. He has a large amount of edema in the right leg and we are fortunate that he only has a superficial medial wound. The patient saw his primary physician Dr. [name] on 12/17. He was felt to have cellulitis and put on Levaquin and doxy. Any degree of cellulitis he had then is now resolved 1/15; the patient is just about healed. Small open area remaining on the right leg we will put him back in compression. There is nothing on the left leg he has a loose fitting 20/30 stocking for the left leg. I think he is going to need better than this bilaterally if not currently then soon we will go ahead and order him bilateral external compression garments Objective Constitutional Patient is hypotensive. However he appears well. Pulse regular and within target  range for patient.Marland Kitchen Respirations regular, non-labored and within target range.. Temperature is normal and within the target range for the patient.Marland Kitchen Appears in no distress. Vitals Time Taken: 2:19 PM, Height: 77 in, Source: Stated, Weight: 350 lbs, Source: Stated, BMI: 41.5, Temperature: 98.6 F, Pulse: 54 bpm, Respiratory Rate: 20 breaths/min, Blood Pressure: 92/70 mmHg. Cardiovascular It will pulses are palpable on the left. General Notes: Wound examination the area on the right medial knee is still closed. Small open area on the right medial lower leg control here is good there is no recurrence of cellulitis no evidence of a DVT. There is nothing open on  the left leg Integumentary (Hair, Skin) Wound #10 status is Open. Original cause of wound was Blister. The wound is located on the Right,Medial Lower Leg. The wound measures 0.1cm length x 0.1cm width x 0.1cm depth; 0cm^2 area and 0cm^3 volume. There is Fat Layer (Subcutaneous Tissue) Exposed exposed. There is no tunneling or undermining noted. There is a none present amount of drainage noted. The wound margin is flat and intact. There is medium (34-66%) pink granulation within the wound bed. There is no necrotic tissue within the wound bed. Assessment Active Problems ICD-10 Lymphedema, not elsewhere classified Non-pressure chronic ulcer of other part of right lower leg limited to breakdown of skin Chronic venous hypertension (idiopathic) with inflammation of bilateral lower extremity Type 2 diabetes mellitus with other skin ulcer Procedures Wound #10 Pre-procedure diagnosis of Wound #10 is a Venous Leg Ulcer located on the Right,Medial Lower Leg . There was a Four Layer Compression Therapy Procedure by Kela Millin, RN. Post procedure Diagnosis Wound #10: Same as Pre-Procedure Plan Follow-up Appointments: Return Appointment in 1 week. Dressing Change Frequency: Do not change entire dressing for one week. Skin Barriers/Peri-Wound Care: Moisturizing lotion - lotion to both legs at night. Wound Cleansing: May shower with protection. Edema Control: 4 layer compression - Right Lower Extremity - use unna boot to upper leg Avoid standing for long periods of time Elevate legs to the level of the heart or above for 30 minutes daily and/or when sitting, a frequency of: - throughout the day. Exercise regularly Support Garment 20-30 mm/Hg pressure to: - Patient to apply in the morning and remove at night. Apply compression stockings to left leg Home Health: [facility], [place] 1. Still 4-layer compression on the right leg 2. He is using his 22,030  stocking on the left leg 3. Bilateral juxta lite stockings. Should be dischargeable next week Electronic Signature(s) Signed: 11/01/2019 6:24:11 PM By: Linton Ham MD Entered By: Linton Ham on 11/01/2019 18:23:31 -------------------------------------------------------------------------------- SuperBill Details Patient Name: Date of Service: [name] 11/01/2019 Medical Record [id number] Patient Account Number: [id number] Date of Birth/Sex: Treating RN: Feb 26, 1946 (74 y.o. [name] Primary Care Provider: [name] Other Clinician: Referring Provider: Treating Provider/Extender:Charelle Petrakis, [name], [name] in Treatment: 15 Diagnosis Coding ICD-10 Codes Code Description I89.0 Lymphedema, not elsewhere classified L97.811 Non-pressure chronic ulcer of other part of right lower leg limited to breakdown of skin I87.323 Chronic venous hypertension (idiopathic) with inflammation of bilateral lower extremity E11.622 Type 2 diabetes mellitus with other skin ulcer Facility Procedures The patient participates with Medicare or their insurance follows the Medicare Facility Guidelines: CPT4 Code Description Modifier Quantity [id number] (Facility Use Only) [id number] - [place] RT LEG 1 Physician Procedures CPT4 Code Description: [id number] - WC PHYS LEVEL 2 - EST PT ICD-10 Diagnosis Description L97.811 Non-pressure chronic ulcer of  other part of right lower l skin I89.0 Lymphedema, not elsewhere classified Modifier: eg limited to b Quantity: 1 reakdown of Electronic Signature(s) Signed: 11/01/2019 6:24:11 PM By: Linton Ham MD Previous Signature: 11/01/2019 5:57:08 PM Version By: Kela Millin Entered By: Linton Ham on 11/01/2019 18:23:50

## 2019-11-01 NOTE — Progress Notes (Addendum)
NILO, FALLIN ([id number]) Visit Report for 11/01/2019 Arrival Information Details Patient Name: Date of Service: [name], [name] 11/01/2019 1:15 PM Medical Record [id number] Patient Account Number: [id number] Date of Birth/Sex: Treating RN: 09-05-1946 (74 y.o. Ernestene Mention Primary Care Jodiann Ognibene: [name] Other Clinician: Referring Cherilyn Sautter: Treating Ladiamond Gallina/Extender:Robson, [name], [name] in Treatment: 15 Visit Information History Since Last Visit Added or deleted any medications: No Patient Arrived: [name] Any new allergies or adverse reactions: No Arrival Time: 14:18 Had a fall or experienced change in No Accompanied By: spouse activities of daily living that may affect Transfer Assistance: None risk of falls: Patient Identification Verified: Yes Signs or symptoms of abuse/neglect since last No Secondary Verification Process Yes visito Completed: Hospitalized since last visit: No Patient Requires Transmission- No Implantable device outside of the clinic excluding No Based Precautions: cellular tissue based products placed in the center Patient Has Alerts: Yes since last visit: Patient Alerts: Patient on Blood Has Dressing in Place as Prescribed: Yes Thinner Has Compression in Place as Prescribed: Yes Pain Present Now: No Electronic Signature(s) Signed: 11/01/2019 6:26:16 PM By: Baruch Gouty RN, BSN Entered By: Baruch Gouty on 11/01/2019 14:19:08 -------------------------------------------------------------------------------- Compression Therapy Details Patient Name: Date of Service: [name] 11/01/2019 1:15 PM Medical Record [id number] Patient Account Number: [id number] Date of Birth/Sex: Treating RN: [birth year] (75 y.o. [name] Primary Care Jacklyne Baik: [name] Other Clinician: Referring Ruta Capece: Treating Prentice Sackrider/Extender:Robson, [name], [name] in Treatment:  15 Compression Therapy Performed for Wound Wound #10 Right,Medial Lower Leg Assessment: Performed By: Clinician Kela Millin, RN Compression Type: Four Layer Post Procedure Diagnosis Same as Pre-procedure Electronic Signature(s) Signed: 11/01/2019 5:57:08 PM By: Kela Millin Entered By: Kela Millin on 11/01/2019 15:11:54 -------------------------------------------------------------------------------- Encounter Discharge Information Details Patient Name: Date of Service: [name] 11/01/2019 1:15 PM Medical Record [id number] Patient Account Number: [id number] Date of Birth/Sex: Treating RN: 03-04-1946 (74 y.o. Ernestene Mention Primary Care [name]: [name] Other Clinician: Referring Ghassan Coggeshall: Treating Deserea Bordley/Extender:Robson, [name], [name] in Treatment: 15 Encounter Discharge Information Items Discharge Condition: Stable Ambulatory Status: Walker Discharge Destination: Home Transportation: Private Auto Accompanied By: spouse Schedule Follow-up Appointment: Yes Clinical Summary of Care: Patient Declined Electronic Signature(s) Signed: 11/01/2019 6:26:16 PM By: Baruch Gouty RN, BSN Entered By: Baruch Gouty on 11/01/2019 15:28:09 -------------------------------------------------------------------------------- Lower Extremity Assessment Details Patient Name: Date of Service: [name] 11/01/2019 1:15 PM Medical Record [id number] Patient Account Number: [id number] Date of Birth/Sex: Treating RN: 03-Dec-1945 (74 y.o. Ernestene Mention Primary Care [place] Flight: [name] Other Clinician: Referring Jennie Hannay: Treating Kahli Fitzgerald/Extender:Robson, [name], [name] in Treatment: 15 Edema Assessment Assessed: [Left: No] [Right: No] Edema: [Left: Ye] [Right: s] Calf Left: Right: Point of Measurement: 34 cm From Medial Instep cm 40 cm Ankle Left: Right: Point of Measurement: 13 cm From Medial  Instep cm 25.5 cm Vascular Assessment Pulses: Dorsalis Pedis Palpable: [Right:Yes] Electronic Signature(s) Signed: 11/01/2019 6:26:16 PM By: Baruch Gouty RN, BSN Entered By: Baruch Gouty on 11/01/2019 14:28:12 -------------------------------------------------------------------------------- Multi Wound Chart Details Patient Name: Date of Service: [name] 11/01/2019 1:15 PM Medical Record [id number] Patient Account Number: [id number] Date of Birth/Sex: Treating RN: 12/05/1945 (74 y.o. M) Primary Care [name]: [name] Other Clinician: Referring Shekira Drummer: Treating Montana Bryngelson/Extender:Robson, [name], [name] in Treatment: 15 Vital Signs Height(in): 77 Pulse(bpm): 54 Weight(lbs): 350 Blood Pressure(mmHg): 92/70 Body Mass Index(BMI): 41 Temperature(F): 98.6 Respiratory 20 Rate(breaths/min): Photos: [10:No Photos] [N/A:N/A] Wound Location: [10:Right  Lower Leg - Medial N/A] Wounding Event: [10:Blister] [N/A:N/A] Primary Etiology: [10:Venous Leg Ulcer] [N/A:N/A] Comorbid History: [10:Sleep Apnea, Congestive N/A Heart Failure, Hypertension, Type II Diabetes, Neuropathy, Received Chemotherapy] Date Acquired: [10:10/03/2019] [N/A:N/A] Weeks of Treatment: [10:1] [N/A:N/A] Wound Status: [10:Open] [N/A:N/A] Measurements L x W x D 0.1x0.1x0.1 [N/A:N/A] (cm) Area (cm) : [10:0] [N/A:N/A] Volume (cm) : [10:0] [N/A:N/A] % Reduction in Area: [10:100.00%] [N/A:N/A] % Reduction in Volume: [10:100.00%] [N/A:N/A] Classification: [10:Full Thickness Without Exposed Support Structures] [N/A:N/A] Exudate Amount: [10:None Present] [N/A:N/A] Wound Margin: [10:Flat and Intact] [N/A:N/A] Granulation Amount: [10:Medium (34-66%)] [N/A:N/A] Granulation Quality: [10:Pink] [N/A:N/A] Necrotic Amount: [10:None Present (0%)] [N/A:N/A] Exposed Structures: [10:Fat Layer (Subcutaneous N/A Tissue) Exposed: Yes Fascia: No Tendon: No Muscle: No Joint: No Bone:  No] Epithelialization: [10:Large (67-100%) Compression Therapy] [N/A:N/A N/A] Treatment Notes Wound #10 (Right, Medial Lower Leg) 2. Periwound Care Moisturizing lotion 6. Support Layer Applied 4 layer compression Water quality scientist) Signed: 11/01/2019 6:24:11 PM By: Linton Ham MD Entered By: Linton Ham on 11/01/2019 18:21:11 -------------------------------------------------------------------------------- Multi-Disciplinary Care Plan Details Patient Name: Date of Service: TAYLOR, LEVICK 11/01/2019 1:15 PM Medical Record [id number] Patient Account Number: [id number] Date of Birth/Sex: Treating RN: 1946/06/05 (74 y.o. [name] Primary Care Kamiah Fite: [name] Other Clinician: Referring Marilou Barnfield: Treating Chelby Salata/Extender:Robson, [name], [name] in Treatment: 15 Active Inactive Nutrition Nursing Diagnoses: Potential for alteratiion in Nutrition/Potential for imbalanced nutrition Goals: Patient/caregiver agrees to and verbalizes understanding of need to obtain nutritional consultation Date Initiated: 07/18/2019 Target Resolution Date: 11/22/2019 Goal Status: Active Interventions: Assess patient nutrition upon admission and as needed per policy Provide education on nutrition Treatment Activities: Education provided on Nutrition : 10/25/2019 Patient referred to Primary Care Physician for further nutritional evaluation : 07/18/2019 Notes: Pain, Acute or Chronic Nursing Diagnoses: Pain, acute or chronic: actual or potential Potential alteration in comfort, pain Goals: Patient will verbalize adequate pain control and receive pain control interventions during procedures as needed Date Initiated: 07/18/2019 Target Resolution Date: 11/22/2019 Goal Status: Active Patient/caregiver will verbalize comfort level met Date Initiated: 07/18/2019 Date Inactivated: 08/08/2019 Target Resolution Date: 08/23/2019 Goal Status:  Met Interventions: Encourage patient to take pain medications as prescribed Provide education on pain management Reposition patient for comfort Treatment Activities: Administer pain control measures as ordered : 07/18/2019 Notes: Electronic Signature(s) Signed: 11/01/2019 5:57:08 PM By: Kela Millin Entered By: Kela Millin on 11/01/2019 14:41:12 -------------------------------------------------------------------------------- Pain Assessment Details Patient Name: Date of Service: OTNIEL, HOE 11/01/2019 1:15 PM Medical Record [id number] Patient Account Number: [id number] Date of Birth/Sex: Treating RN: 08-14-1946 (74 y.o. Ernestene Mention Primary Care Ikeisha Blumberg: [name] Other Clinician: Referring Marcia Lepera: Treating Kazim Corrales/Extender:Robson, [name], [name] in Treatment: 15 Active Problems Location of Pain Severity and Description of Pain Patient Has Paino No Patient Has Paino No Site Locations Rate the pain. Current Pain Level: 0 Pain Management and Medication Current Pain Management: Electronic Signature(s) Signed: 11/01/2019 6:26:16 PM By: Baruch Gouty RN, BSN Entered By: Baruch Gouty on 11/01/2019 14:27:42 -------------------------------------------------------------------------------- Patient/Caregiver Education Details Patient Name: Date of Service: [name] 1/15/2021andnbsp1:15 PM Medical Record [id number] Patient Account Number: [id number] Date of Birth/Gender: Treating RN: 04/11/1946 (73 y.o. [name] Primary Care Physician: [name] Other Clinician: Referring Physician: Treating Physician/Extender:Robson, [name], [name] in Treatment: 15 Education Assessment Education Provided To: Patient Education Topics Provided Nutrition: Methods: Explain/Verbal Responses: State content correctly Pain: Methods: Explain/Verbal Responses: State content correctly Electronic  Signature(s) Signed: 11/01/2019 5:57:08 PM By: Kela Millin Entered By:  Kela Millin on 11/01/2019 14:41:27 -------------------------------------------------------------------------------- Wound Assessment Details Patient Name: Date of Service: DEOVION, BATREZ 11/01/2019 1:15 PM Medical Record [id number] Patient Account Number: [id number] Date of Birth/Sex: Treating RN: 1945-12-28 (74 y.o. [name] Primary Care Tara Rud: [name] Other Clinician: Referring Tannie Koskela: Treating Mel Tadros/Extender:Robson, [name], [name] in Treatment: 15 Wound Status Wound Number: 10 Primary Venous Leg Ulcer Etiology: Wound Location: Right Lower Leg - Medial Wound Open Wounding Event: Blister Status: Date Acquired: 10/03/2019 Comorbid Sleep Apnea, Congestive Heart Failure, Weeks Of Treatment: 1 History: Hypertension, Type II Diabetes, Neuropathy, Clustered Wound: No Received Chemotherapy Photos Wound Measurements Length: (cm) 0.1 % Reduct Width: (cm) 0.1 % Reduct Depth: (cm) 0.1 Epitheli Area: (cm) 0 Tunneli Volume: (cm) 0 Undermi Wound Description Classification: Full Thickness Without Exposed Support Foul Odo Structures Slough/F Wound Flat and Intact Margin: Exudate None Present Amount: Wound Bed Granulation Amount: Medium (34-66%) Granulation Quality: Pink Fascia E Necrotic Amount: None Present (0%) Fat Laye Tendon E Muscle E Joint Ex Bone Exp Treatment Notes Wound #10 (Right, Medial Lower Leg) 2. Periwound Care Moisturizing lotion 6. Support Layer Applied 4 layer compression wrap r After Cleansing: No ibrino No Exposed Structure xposed: No r (Subcutaneous Tissue) Exposed: Yes xposed: No xposed: No posed: No osed: No ion in Area: 100% ion in Volume: 100% alization: Large (67-100%) ng: No ning: No Electronic Signature(s) Signed: 11/04/2019 5:45:55 PM By: Kela Millin Signed: 11/05/2019 4:37:08 PM By: Mikeal Hawthorne EMT/HBOT Previous Signature: 11/01/2019 5:57:08 PM Version By: Kela Millin Entered By: Mikeal Hawthorne on 11/04/2019 14:33:20 -------------------------------------------------------------------------------- Vitals Details Patient Name: Date of Service: [name] 11/01/2019 1:15 PM Medical Record [id number] Patient Account Number: [id number] Date of Birth/Sex: Treating RN: [birth year] (74 y.o. Ernestene Mention Primary Care Horacio Werth: [name] Other Clinician: Referring Alric Geise: Treating Broughton Eppinger/Extender:Robson, [name], [name] in Treatment: 15 Vital Signs Time Taken: 14:19 Temperature (F): 98.6 Height (in): 77 Pulse (bpm): 54 Source: Stated Respiratory Rate (breaths/min): 20 Weight (lbs): 350 Blood Pressure (mmHg): 92/70 Source: Stated Reference Range: 80 - 120 mg / dl Body Mass Index (BMI): 41.5 Electronic Signature(s) Signed: 11/01/2019 6:26:16 PM By: Baruch Gouty RN, BSN Entered By: Baruch Gouty on 11/01/2019 14:20:33

## 2019-11-04 ENCOUNTER — Other Ambulatory Visit: Payer: Self-pay | Admitting: Cardiology

## 2019-11-04 ENCOUNTER — Ambulatory Visit (INDEPENDENT_AMBULATORY_CARE_PROVIDER_SITE_OTHER): Payer: Medicare Other | Admitting: Family Medicine

## 2019-11-04 ENCOUNTER — Other Ambulatory Visit: Payer: Self-pay

## 2019-11-04 ENCOUNTER — Encounter: Payer: Self-pay | Admitting: Family Medicine

## 2019-11-04 VITALS — BP 110/58 | HR 51 | Temp 96.2°F | Wt 326.4 lb

## 2019-11-04 DIAGNOSIS — R42 Dizziness and giddiness: Secondary | ICD-10-CM | POA: Diagnosis not present

## 2019-11-04 DIAGNOSIS — I10 Essential (primary) hypertension: Secondary | ICD-10-CM | POA: Diagnosis not present

## 2019-11-04 DIAGNOSIS — E119 Type 2 diabetes mellitus without complications: Secondary | ICD-10-CM | POA: Diagnosis not present

## 2019-11-04 DIAGNOSIS — I5022 Chronic systolic (congestive) heart failure: Secondary | ICD-10-CM

## 2019-11-04 DIAGNOSIS — I480 Paroxysmal atrial fibrillation: Secondary | ICD-10-CM

## 2019-11-04 DIAGNOSIS — R609 Edema, unspecified: Secondary | ICD-10-CM | POA: Diagnosis not present

## 2019-11-04 LAB — CBC WITH DIFFERENTIAL/PLATELET
Basophils Absolute: 0 10*3/uL (ref 0.0–0.1)
Basophils Relative: 0.4 % (ref 0.0–3.0)
Eosinophils Absolute: 0.1 10*3/uL (ref 0.0–0.7)
Eosinophils Relative: 1.6 % (ref 0.0–5.0)
HCT: 41.7 % (ref 39.0–52.0)
Hemoglobin: 13.8 g/dL (ref 13.0–17.0)
Lymphocytes Relative: 19.4 % (ref 12.0–46.0)
Lymphs Abs: 1.4 10*3/uL (ref 0.7–4.0)
MCHC: 33.1 g/dL (ref 30.0–36.0)
MCV: 88.8 fl (ref 78.0–100.0)
Monocytes Absolute: 0.6 10*3/uL (ref 0.1–1.0)
Monocytes Relative: 8.3 % (ref 3.0–12.0)
Neutro Abs: 5.2 10*3/uL (ref 1.4–7.7)
Neutrophils Relative %: 70.3 % (ref 43.0–77.0)
Platelets: 181 10*3/uL (ref 150.0–400.0)
RBC: 4.69 Mil/uL (ref 4.22–5.81)
RDW: 15.2 % (ref 11.5–15.5)
WBC: 7.4 10*3/uL (ref 4.0–10.5)

## 2019-11-04 LAB — BASIC METABOLIC PANEL
BUN: 24 mg/dL — ABNORMAL HIGH (ref 6–23)
CO2: 28 mEq/L (ref 19–32)
Calcium: 9 mg/dL (ref 8.4–10.5)
Chloride: 101 mEq/L (ref 96–112)
Creatinine, Ser: 1.41 mg/dL (ref 0.40–1.50)
GFR: 49.2 mL/min — ABNORMAL LOW (ref >60.00)
Glucose, Bld: 92 mg/dL (ref 70–99)
Potassium: 4 mEq/L (ref 3.5–5.1)
Sodium: 140 mEq/L (ref 135–145)

## 2019-11-04 LAB — HEMOGLOBIN A1C: Hgb A1c MFr Bld: 5.6 % (ref 4.6–6.5)

## 2019-11-04 MED ORDER — MECLIZINE HCL 25 MG PO TABS: 25.0000 mg | ORAL_TABLET | ORAL | 5 refills | Status: DC | PRN

## 2019-11-04 MED ORDER — FUROSEMIDE 40 MG PO TABS: 40.0000 mg | ORAL_TABLET | Freq: Every day | ORAL | 3 refills | Status: DC

## 2019-11-04 NOTE — Progress Notes (Signed)
   Subjective:    Patient ID: [name], male    DOB: Apr 01, 1946, 74 y.o.   MRN: [id number]  HPI Here to follow up and to complain of intermittent dizziness. He has had vertigo for years and over the past month he has had similar spells. When he gets up or moves his head quickly, the room seems to spin a bit. No headache. His BP has been low, around 557-322 systolic. He last saw Cardiology in September. He continues to lose weight and he says his appetite is poor at times. Since July 2020 he has lost 74 lbs.    Review of Systems  Constitutional: Positive for fatigue.  Respiratory: Negative.   Cardiovascular: Negative.   Neurological: Positive for dizziness and light-headedness.       Objective:   Physical Exam Constitutional:      Appearance: He is not ill-appearing.  Cardiovascular:     Rate and Rhythm: Normal rate and regular rhythm.     Pulses: Normal pulses.     Heart sounds: Normal heart sounds.  Pulmonary:     Effort: Pulmonary effort is normal.     Breath sounds: Normal breath sounds.  Musculoskeletal:     Comments: 2+ edema in both lower legs   Neurological:     Mental Status: He is alert.           Assessment & Plan:  His HTN and atrial fibrillation are stable. In fact his BP has been running a little low, so we will stop the Olmesartan. He continues on one Lasix a day for the swelling. He has chronic vertigo, so we will refill the Meclizine for him to use as needed. We will check labs today to follow his renal failure, diabetes, and anemia. I asked him to schedule to see Dr. [name] soon.  Alysia Penna, MD

## 2019-11-05 DIAGNOSIS — I110 Hypertensive heart disease with heart failure: Secondary | ICD-10-CM | POA: Diagnosis not present

## 2019-11-05 DIAGNOSIS — L97811 Non-pressure chronic ulcer of other part of right lower leg limited to breakdown of skin: Secondary | ICD-10-CM | POA: Diagnosis not present

## 2019-11-05 DIAGNOSIS — I87302 Chronic venous hypertension (idiopathic) without complications of left lower extremity: Secondary | ICD-10-CM | POA: Diagnosis not present

## 2019-11-05 DIAGNOSIS — L03115 Cellulitis of right lower limb: Secondary | ICD-10-CM | POA: Diagnosis not present

## 2019-11-05 DIAGNOSIS — L97211 Non-pressure chronic ulcer of right calf limited to breakdown of skin: Secondary | ICD-10-CM | POA: Diagnosis not present

## 2019-11-05 DIAGNOSIS — I87331 Chronic venous hypertension (idiopathic) with ulcer and inflammation of right lower extremity: Secondary | ICD-10-CM | POA: Diagnosis not present

## 2019-11-05 NOTE — Telephone Encounter (Signed)
Patient has been seen in office. Nothing further needed.

## 2019-11-07 ENCOUNTER — Ambulatory Visit (INDEPENDENT_AMBULATORY_CARE_PROVIDER_SITE_OTHER): Payer: Medicare Other | Admitting: Cardiology

## 2019-11-07 ENCOUNTER — Encounter: Payer: Self-pay | Admitting: Cardiology

## 2019-11-07 ENCOUNTER — Other Ambulatory Visit: Payer: Self-pay

## 2019-11-07 VITALS — BP 107/59 | HR 57 | Temp 97.2°F | Ht 76.0 in | Wt 326.6 lb

## 2019-11-07 DIAGNOSIS — R609 Edema, unspecified: Secondary | ICD-10-CM | POA: Diagnosis not present

## 2019-11-07 DIAGNOSIS — I480 Paroxysmal atrial fibrillation: Secondary | ICD-10-CM | POA: Diagnosis not present

## 2019-11-07 DIAGNOSIS — I10 Essential (primary) hypertension: Secondary | ICD-10-CM | POA: Diagnosis not present

## 2019-11-07 DIAGNOSIS — I5042 Chronic combined systolic (congestive) and diastolic (congestive) heart failure: Secondary | ICD-10-CM | POA: Diagnosis not present

## 2019-11-07 DIAGNOSIS — I428 Other cardiomyopathies: Secondary | ICD-10-CM | POA: Diagnosis not present

## 2019-11-07 NOTE — Progress Notes (Signed)
Cardiology Office Note:    Date:  11/07/2019   ID:  [name], DOB 1946/07/12, MRN [id number]  PCP:  [name], MD  Cardiologist:  Buford Dresser, MD PhD  Referring MD: [name], MD   CC: follow up  History of Present Illness:    [name] is a 74 y.o. male with a hx of atrial fibrillation, type II diabetes, hypertension, dyslipidemia, previously recovered systolic heart failure (EF 35-40% 2015 to 55-60% 2015) with most recent EF 45-50%.  I met him via video visit 05/03/19, please see notes.  Today: Here with his wife today.  [place] and hit his right knee over the summer, healing up. Has been a long process but doing well with wound care. Has led to a little more swelling than usual in his leg, but this is being watched closely.  Weight down significantly. Was 400 lbs 04/2019, 348 lbs 09/2019, 326 lbs today. Eating less. Feels that breathing is much improved.   Has not noted any fast heart rates. As far as he can tell, he has remained in sinus rhythm. Discussed signs/symptoms to watch for.   He recently endorsed lightheadedness at his primary care visit. He describes spinning room symptoms more than orthostasis. Blood pressure borderline today. As he loses weight, may need to scale back. Has already come off olmesartan and spironolactone.  Denies chest pain. No PND, orthopnea, or unexpected weight gain. No syncope or palpitations.   Past Medical History:  Diagnosis Date  . Chronic systolic CHF (congestive heart failure) ([place])    a. Echo (08/28/13): Mild LVH, EF 35-40%, diffuse HK, moderate to severe LAE.  . Diabetes mellitus without complication ([place])   . Diverticulitis   . Headache(784.0)   . Hyperlipidemia   . Hypertension   . Lymphoma, non Hodgkin's    sees Dr. [name]   . Morbid obesity ([place])   . NICM (nonischemic cardiomyopathy) ([place])    a. R/L Heart cath 08/30/13: RA mean 8, RV 30/0, PA 27/12, mean PCWP 9, CO 5.67, CI 1.98; normal  coronary arteries  . Sleep apnea     Past Surgical History:  Procedure Laterality Date  . CARDIOVERSION N/A 06/28/2019   Procedure: CARDIOVERSION;  Surgeon: Lelon Perla, MD;  Location: [facility] ENDOSCOPY;  Service: Cardiovascular;  Laterality: N/A;  . HERNIA REPAIR     umblical  . incarcerated hernia     vental 11/19/08 Dr. [name]  . LEFT AND RIGHT HEART CATHETERIZATION WITH CORONARY ANGIOGRAM N/A 08/30/2013   Procedure: LEFT AND RIGHT HEART CATHETERIZATION WITH CORONARY ANGIOGRAM;  Surgeon: [name], MD;  Location: [facility] CATH LAB;  Service: Cardiovascular;  Laterality: N/A;    Current Medications: Current Outpatient Medications on File Prior to Visit  Medication Sig  . amiodarone (PACERONE) 200 MG tablet Take 1 tablet (200 mg total) by mouth 2 (two) times daily.  Marland Kitchen amLODipine (NORVASC) 5 MG tablet Take 1 tablet (5 mg total) by mouth daily.  Marland Kitchen atorvastatin (LIPITOR) 20 MG tablet Take 1 tablet (20 mg total) by mouth daily.  Marland Kitchen ELIQUIS 5 MG TABS tablet TAKE 1 TABLET BY MOUTH TWICE A DAY  . FLUoxetine (PROZAC) 40 MG capsule TAKE 1 CAPSULE(40 MG) BY MOUTH DAILY  . furosemide (LASIX) 40 MG tablet Take 1 tablet (40 mg total) by mouth daily.  Marland Kitchen levofloxacin (LEVAQUIN) 500 MG tablet Take 1 tablet (500 mg total) by mouth daily.  . meclizine (ANTIVERT) 25 MG tablet Take 1 tablet (25 mg total)  by mouth every 4 (four) hours as needed for dizziness.  . metFORMIN (GLUCOPHAGE) 500 MG tablet TAKE 1 TABLET BY MOUTH TWICE DAILY WITH A MEAL (Patient taking differently: Take 500 mg by mouth 2 (two) times daily with a meal. TAKE 1 TABLET BY MOUTH TWICE DAILY WITH A MEAL)  . metoprolol (TOPROL-XL) 200 MG 24 hr tablet TAKE 1 TABLET BY MOUTH EVERY DAY  . Multiple Vitamins-Minerals (MULTIVITAMIN MEN 50+) TABS Take 1 tablet by mouth daily.  . polyethylene glycol (MIRALAX / GLYCOLAX) packet Take 17 g by mouth daily as needed for moderate constipation.  . potassium chloride (KLOR-CON 10) 10 MEQ tablet Take 1  tablet (10 mEq total) by mouth daily.  Marland Kitchen spironolactone (ALDACTONE) 25 MG tablet TAKE 1 TABLET BY MOUTH EVERY DAY   No current facility-administered medications on file prior to visit.     Allergies:   Patient has no known allergies.   Social History   Tobacco Use  . Smoking status: Former Smoker    Packs/day: 2.00    Years: 52.00    Pack years: 104.00    Types: Cigarettes    Start date: 08/27/1965    Quit date: 08/28/1999    Years since quitting: 20.2  . Smokeless tobacco: Never Used  . Tobacco comment: quit 13 years ago  Substance Use Topics  . Alcohol use: Yes    Alcohol/week: 0.0 standard drinks    Comment: occ  . Drug use: No    Family History: The patient's family history includes Cancer in his father and maternal grandfather; Diabetes in his father and paternal grandfather; Heart failure in his mother; Hypertension in his father and mother; Stroke in his maternal grandmother.  ROS:   Please see the history of present illness.  Additional pertinent ROS negative except as noted.   EKGs/Labs/Other Studies Reviewed:    The following studies were reviewed today: Echo 04/08/19 1. The left ventricle has mildly reduced systolic function, with an ejection fraction of 45-50%. The cavity size was normal. There is moderately increased left ventricular wall thickness. Left ventricular diastolic function could not be evaluated  secondary to atrial fibrillation. Left ventricular diffuse hypokinesis. 2. Left atrial size was severely dilated. 3. Right atrial size was mildly dilated. 4. The mitral valve is abnormal. Mild thickening of the mitral valve leaflet. 5. The aortic valve is tricuspid. Mild thickening of the aortic valve. Aortic valve regurgitation is trivial by color flow Doppler. 6. There is mild dilatation of the aortic root measuring 43 mm. 7. Extremely limited; definity used; EF difficult to quantitate but appears mildly reduced; moderate LVH; mildly dilated  aortic root; trace AI; biatrial enlargement.  Cath 08/30/2013 Procedural Findings: Hemodynamics RA Mean 8 RV 30/0 PA  27/12 PCWP Mean 9 LV  186/101 AO  177/106  Oxygen saturations: PA 61 AO 94  Cardiac Output (Fick) 5.67  Cardiac Index (Fick) 1.98         Coronary angiography: Coronary dominance: right  Left mainstem: Normal  Left anterior descending (LAD): Normal             D1:normal             D2: normal             IM: normal  Left circumflex (LCx): normal             OM1: small and normal             OM2 : very large branching artery normal  Right coronary artery (RCA):  Dominant and normal  EKG:  EKG is personally reviewed.  The ekg ordered today demonstrates SR, first degree AV block, PVC, IVCD  Recent Labs: 04/03/2019: Pro B Natriuretic peptide (BNP) 383.0 04/07/2019: ALT 26; B Natriuretic Peptide 599.2; Magnesium 2.0; TSH 2.079 11/04/2019: BUN 24; Creatinine, Ser 1.41; Hemoglobin 13.8; Platelets 181.0; Potassium 4.0; Sodium 140  Recent Lipid Panel    Component Value Date/Time   CHOL 249 (H) 02/24/2016 0930   TRIG 228.0 (H) 02/24/2016 0930   HDL 47.10 02/24/2016 0930   CHOLHDL 5 02/24/2016 0930   VLDL 45.6 (H) 02/24/2016 0930   LDLCALC 113 (H) 08/28/2013 0032   LDLDIRECT 180.0 02/24/2016 0930    Physical Exam:    VS:  BP (!) 107/59   Pulse (!) 57   Temp (!) 97.2 F (36.2 C)   Ht '6\' 4"'$  (1.93 m)   Wt (!) 326 lb 9.6 oz (148.1 kg)   SpO2 93%   BMI 39.76 kg/m     Wt Readings from Last 3 Encounters:  11/07/19 (!) 326 lb 9.6 oz (148.1 kg)  11/04/19 (!) 326 lb 6.4 oz (148.1 kg)  10/07/19 (!) 348 lb 12.8 oz (158.2 kg)    GEN: Well nourished, well developed in no acute distress HEENT: Normal, moist mucous membranes NECK: No JVD CARDIAC: regular rhythm, normal S1 and S2, no rubs or gallops. No murmur. VASCULAR: Radial and DP pulses 2+ bilaterally. No carotid bruits RESPIRATORY:  Clear to auscultation without rales, wheezing or rhonchi    ABDOMEN: Soft, non-tender, non-distended MUSCULOSKELETAL:  Ambulates independently SKIN: Warm and dry. Healing medial knee wound on R leg. 1+ edema on R leg, trace edema L leg with chronic skin changes.  NEUROLOGIC:  Alert and oriented x 3. No focal neuro deficits noted. PSYCHIATRIC:  Normal affect   ASSESSMENT:    1. Paroxysmal atrial fibrillation (HCC)   2. Chronic combined systolic and diastolic heart failure ([place])   3. NICM (nonischemic cardiomyopathy) ([place])   4. Essential hypertension   5. Chronic edema    PLAN:    Atrial fibrillation: paroxysmal CHA2DS2/VAS Stroke Risk= 4  -has remained in SR since cardioversion 06/28/19 -continue amiodarone 200 mg BID. If weight loss continues, consider drop back to daily dose -Recheck LFTs, TFTs with next labs -rate controlled on metoprolol -continue apixaban 5 mg BID  Chronic systolic and diastolic dysfunction: diffuse hypokinesis on echo, EF 45-50%. Chronic LE edema and dyspnea on exertion. Nonischemic cardiomyopathy based on cath 2014 at time of diagnosis. -edema, symptoms much improved with weight loss. Congratulated on this.  -continued HF education. This is difficult for him, and he needs assistance. Counseled on daily weights, fluid, salt avoidance -on metoprolol succinate 200 mg. Pulse today 57. Denies lows, but per chart appears he occasionally has numbers in the 40s. He will contact me if heart rates consistently low, may need to cut metoprolol dose. -was on olmesartan, stopped just recently due to hypotension -continue chronic diuresis with furosemide 40 mg BID with K supplement. On spironolactone as well.  Chronic LE edema: improved from prior. Chronic stasis changes. Healing R knee injury. Following with wound clinic.  Hypertension: at goal today, meds as above -with continued weight loss, may need to scale back on medications in the future. Would drop amlodipine first given chronic edema. -symptoms do not sound orthostatic  in nature, but instructed him to monitor and contact me with any concerns  Cardiac risk counseling and prevention recommendations: -recommend heart healthy/Mediterranean diet,  with whole grains, fruits, vegetable, fish, lean meats, nuts, and olive oil. Limit salt. -recommend moderate walking, 3-5 times/week for 30-50 minutes each session. Aim for at least 150 minutes.week. Goal should be pace of 3 miles/hours, or walking 1.5 miles in 30 minutes -recommend avoidance of tobacco products. Avoid excess alcohol.  Plan for follow up: 6 mos or sooner PRN if symptoms worse, especially if HR/BP become lower  Medication Adjustments/Labs and Tests Ordered: Current medicines are reviewed at length with the patient today.  Concerns regarding medicines are outlined above.  No orders of the defined types were placed in this encounter.  No orders of the defined types were placed in this encounter.   Patient Instructions  Medication Instructions:  Your physician recommends that you continue on your current medications as directed. Please refer to the Current Medication list given to you today. *If you need a refill on your cardiac medications before your next appointment, please call your pharmacy*  Lab Work: None  If you have labs (blood work) drawn today and your tests are completely normal, you will receive your results only by: Marland Kitchen MyChart Message (if you have MyChart) OR . A paper copy in the mail If you have any lab test that is abnormal or we need to change your treatment, we will call you to review the results.  Testing/Procedures: None   Follow-Up: At [facility], you and your health needs are our priority.  As part of our continuing mission to provide you with exceptional heart care, we have created designated Provider Care Teams.  These Care Teams include your primary Cardiologist (physician) and Advanced Practice Providers (APPs -  Physician Assistants and Nurse Practitioners) who all  work together to provide you with the care you need, when you need it.  Your next appointment:   6 month(s)  The format for your next appointment:   In Person  Provider:   Buford Dresser, MD  Other Instructions     Signed, Buford Dresser, MD PhD 11/07/2019  [place]

## 2019-11-07 NOTE — Patient Instructions (Addendum)
Medication Instructions:  Your physician recommends that you continue on your current medications as directed. Please refer to the Current Medication list given to you today. *If you need a refill on your cardiac medications before your next appointment, please call your pharmacy*  Lab Work: None  If you have labs (blood work) drawn today and your tests are completely normal, you will receive your results only by: . MyChart Message (if you have MyChart) OR . A paper copy in the mail If you have any lab test that is abnormal or we need to change your treatment, we will call you to review the results.  Testing/Procedures: None   Follow-Up: At [facility], you and your health needs are our priority.  As part of our continuing mission to provide you with exceptional heart care, we have created designated Provider Care Teams.  These Care Teams include your primary Cardiologist (physician) and Advanced Practice Providers (APPs -  Physician Assistants and Nurse Practitioners) who all work together to provide you with the care you need, when you need it.  Your next appointment:   6 month(s)  The format for your next appointment:   In Person  Provider:   Bridgette Christopher, MD  Other Instructions  

## 2019-11-08 ENCOUNTER — Encounter (HOSPITAL_BASED_OUTPATIENT_CLINIC_OR_DEPARTMENT_OTHER): Payer: Medicare Other | Admitting: Internal Medicine

## 2019-11-08 DIAGNOSIS — E1140 Type 2 diabetes mellitus with diabetic neuropathy, unspecified: Secondary | ICD-10-CM | POA: Diagnosis not present

## 2019-11-08 DIAGNOSIS — S81801A Unspecified open wound, right lower leg, initial encounter: Secondary | ICD-10-CM | POA: Diagnosis not present

## 2019-11-08 DIAGNOSIS — L97812 Non-pressure chronic ulcer of other part of right lower leg with fat layer exposed: Secondary | ICD-10-CM | POA: Diagnosis not present

## 2019-11-08 DIAGNOSIS — E1151 Type 2 diabetes mellitus with diabetic peripheral angiopathy without gangrene: Secondary | ICD-10-CM | POA: Diagnosis not present

## 2019-11-08 DIAGNOSIS — I872 Venous insufficiency (chronic) (peripheral): Secondary | ICD-10-CM | POA: Diagnosis not present

## 2019-11-08 DIAGNOSIS — I890 Lymphedema, not elsewhere classified: Secondary | ICD-10-CM | POA: Diagnosis not present

## 2019-11-08 DIAGNOSIS — E11622 Type 2 diabetes mellitus with other skin ulcer: Secondary | ICD-10-CM | POA: Diagnosis not present

## 2019-11-08 NOTE — Progress Notes (Addendum)
ELPIDIO, THIELEN ([id number]) Visit Report for 11/08/2019 Arrival Information Details Patient Name: Date of Service: [name], [name] 11/08/2019 1:00 PM Medical Record [id number] Patient Account Number: [id number] Date of Birth/Sex: Treating RN: Jan 18, 1946 (74 y.o. [name], Tammi Klippel Primary Care Jaquila Santelli: [name] Other Clinician: Referring Seleny Allbright: Treating Jerie Basford/Extender:Robson, [name], [name] in Treatment: 16 Visit Information History Since Last Visit Added or deleted any medications: No Patient Arrived: [name] Any new allergies or adverse reactions: No Arrival Time: 13:08 Had a fall or experienced change in No Accompanied By: wife activities of daily living that may affect Transfer Assistance: None risk of falls: Patient Identification Verified: Yes Signs or symptoms of abuse/neglect since last No Secondary Verification Process Yes visito Completed: Hospitalized since last visit: No Patient Requires Transmission- No Implantable device outside of the clinic excluding No Based Precautions: cellular tissue based products placed in the center Patient Has Alerts: Yes since last visit: Patient Alerts: Patient on Blood Has Dressing in Place as Prescribed: Yes Thinner Has Compression in Place as Prescribed: Yes Pain Present Now: No Electronic Signature(s) Signed: 11/08/2019 5:44:09 PM By: Deon Pilling Entered By: Deon Pilling on 11/08/2019 13:08:58 -------------------------------------------------------------------------------- Clinic Level of Care Assessment Details Patient Name: Date of Service: [name], [name] 11/08/2019 1:00 PM Medical Record [id number] Patient Account Number: [id number] Date of Birth/Sex: Treating RN: 08-Dec-1945 (74 y.o. [name] Primary Care Larna Capelle: [name] Other Clinician: Referring Cayci Mcnabb: Treating Irwin Toran/Extender:Robson, [name], [name] in Treatment: 16 Clinic  Level of Care Assessment Items TOOL 4 Quantity Score X - Use when only an EandM is performed on FOLLOW-UP visit 1 0 ASSESSMENTS - Nursing Assessment / Reassessment X - Reassessment of Co-morbidities (includes updates in patient status) 1 10 X - Reassessment of Adherence to Treatment Plan 1 5 ASSESSMENTS - Wound and Skin Assessment / Reassessment X - Simple Wound Assessment / Reassessment - one wound 1 5 []  - Complex Wound Assessment / Reassessment - multiple wounds 0 []  - Dermatologic / Skin Assessment (not related to wound area) 0 ASSESSMENTS - Focused Assessment X - Circumferential Edema Measurements - multi extremities 1 5 []  - Nutritional Assessment / Counseling / Intervention 0 []  - Lower Extremity Assessment (monofilament, tuning fork, pulses) 0 []  - Peripheral Arterial Disease Assessment (using hand held doppler) 0 ASSESSMENTS - Ostomy and/or Continence Assessment and Care []  - Incontinence Assessment and Management 0 []  - Ostomy Care Assessment and Management (repouching, etc.) 0 PROCESS - Coordination of Care X - Simple Patient / Family Education for ongoing care 1 15 []  - Complex (extensive) Patient / Family Education for ongoing care 0 X - Staff obtains Programmer, systems, Records, Test Results / Process Orders 1 10 []  - Staff telephones HHA, Nursing Homes / Clarify orders / etc 0 []  - Routine Transfer to another Facility (non-emergent condition) 0 []  - Routine Hospital Admission (non-emergent condition) 0 []  - New Admissions / Biomedical engineer / Ordering NPWT, Apligraf, etc. 0 []  - Emergency Hospital Admission (emergent condition) 0 X - Simple Discharge Coordination 1 10 []  - Complex (extensive) Discharge Coordination 0 PROCESS - Special Needs []  - Pediatric / Minor Patient Management 0 []  - Isolation Patient Management 0 []  - Hearing / Language / Visual special needs 0 []  - Assessment of Community assistance (transportation, D/C planning, etc.) 0 []  - Additional  assistance / Altered mentation 0 []  - Support Surface(s) Assessment (bed, cushion, seat, etc.) 0 INTERVENTIONS - Wound Cleansing / Measurement X - Simple Wound Cleansing -  one wound 1 5 []  - Complex Wound Cleansing - multiple wounds 0 X - Wound Imaging (photographs - any number of wounds) 1 5 []  - Wound Tracing (instead of photographs) 0 X - Simple Wound Measurement - one wound 1 5 []  - Complex Wound Measurement - multiple wounds 0 INTERVENTIONS - Wound Dressings []  - Small Wound Dressing one or multiple wounds 0 []  - Medium Wound Dressing one or multiple wounds 0 []  - Large Wound Dressing one or multiple wounds 0 []  - Application of Medications - topical 0 []  - Application of Medications - injection 0 INTERVENTIONS - Miscellaneous []  - External ear exam 0 []  - Specimen Collection (cultures, biopsies, blood, body fluids, etc.) 0 []  - Specimen(s) / Culture(s) sent or taken to Lab for analysis 0 []  - Patient Transfer (multiple staff / Civil Service fast streamer / Similar devices) 0 []  - Simple Staple / Suture removal (25 or less) 0 []  - Complex Staple / Suture removal (26 or more) 0 []  - Hypo / Hyperglycemic Management (close monitor of Blood Glucose) 0 []  - Ankle / Brachial Index (ABI) - do not check if billed separately 0 X - Vital Signs 1 5 Has the patient been seen at the hospital within the last three years: Yes Total Score: 80 Level Of Care: New/Established - Level 3 Electronic Signature(s) Signed: 11/08/2019 5:39:40 PM By: Kela Millin Entered By: Kela Millin on 11/08/2019 13:26:17 -------------------------------------------------------------------------------- Lower Extremity Assessment Details Patient Name: Date of Service: [name], [name] 11/08/2019 1:00 PM Medical Record [id number] Patient Account Number: [id number] Date of Birth/Sex: Treating RN: 05-Mar-1946 (74 y.o. [name], Tammi Klippel Primary Care Donni Oglesby: [name] Other Clinician: Referring Roey Coopman: Treating  Syair Fricker/Extender:Robson, [name], [name] in Treatment: 16 Edema Assessment Assessed: [Left: No] [Right: Yes] Edema: [Left: N] [Right: o] Calf Left: Right: Point of Measurement: 34 cm From Medial Instep cm 38 cm Ankle Left: Right: Point of Measurement: 13 cm From Medial Instep cm 26 cm Electronic Signature(s) Signed: 11/08/2019 5:44:09 PM By: Deon Pilling Entered By: Deon Pilling on 11/08/2019 13:12:27 -------------------------------------------------------------------------------- Multi Wound Chart Details Patient Name: Date of Service: [name] 11/08/2019 1:00 PM Medical Record [id number] Patient Account Number: [id number] Date of Birth/Sex: Treating RN: Jul 06, 1946 (74 y.o. M) Primary Care Nagi Furio: [name] Other Clinician: Referring Kamarri Fischetti: Treating Itha Kroeker/Extender:Robson, [name], [name] in Treatment: 16 Vital Signs Height(in): 77 Pulse(bpm): 55 Weight(lbs): 350 Blood Pressure(mmHg): 132/63 Body Mass Index(BMI): 41 Temperature(F): 98.3 Respiratory 20 Rate(breaths/min): Photos: [10:No Photos] [N/A:N/A] Wound Location: [10:Right, Medial Lower Leg] [N/A:N/A] Wounding Event: [10:Blister] [N/A:N/A] Primary Etiology: [10:Venous Leg Ulcer] [N/A:N/A] Date Acquired: [10:10/03/2019] [N/A:N/A] Weeks of Treatment: [10:2] [N/A:N/A] Wound Status: [10:Healed - Epithelialized] [N/A:N/A] Measurements L x W x D [10:0x0x0] [N/A:N/A] (cm) Area (cm) : [10:0] [N/A:N/A] Volume (cm) : [10:0] [N/A:N/A] % Reduction in Area: [10:100.00%] [N/A:N/A] % Reduction in Volume: [10:100.00%] [N/A:N/A] Classification: [10:Full Thickness Without Exposed Support Structures] [N/A:N/A] Treatment Notes Electronic Signature(s) Signed: 11/08/2019 5:37:40 PM By: Linton Ham MD Entered By: Linton Ham on 11/08/2019 13:38:41 -------------------------------------------------------------------------------- Multi-Disciplinary Care Plan  Details Patient Name: Date of Service: [name] 11/08/2019 1:00 PM Medical Record [id number] Patient Account Number: [id number] Date of Birth/Sex: Treating RN: 12/14/1945 (74 y.o. [name] Primary Care Jacai Kipp: [name] Other Clinician: Referring Ravenne Wayment: Treating Larrissa Stivers/Extender:Robson, [name], [name] in Treatment: 16 Active Inactive Electronic Signature(s) Signed: 11/08/2019 5:39:40 PM By: Kela Millin Entered By: Kela Millin on 11/08/2019 13:25:40 -------------------------------------------------------------------------------- Pain Assessment Details Patient Name: Date  of Service: [name], [name] 11/08/2019 1:00 PM Medical Record [id number] Patient Account Number: [id number] Date of Birth/Sex: Treating RN: [birth year] (74 y.o. [name] Primary Care Anh Bigos: [name] Other Clinician: Referring Isla Sabree: Treating Jhordan Mckibben/Extender:Robson, [name], [name] in Treatment: 16 Active Problems Location of Pain Severity and Description of Pain Patient Has Paino No Site Locations Rate the pain. Current Pain Level: 0 Pain Management and Medication Current Pain Management: Medication: No Cold Application: No Rest: No Massage: No Activity: No T.E.N.S.: No Heat Application: No Leg drop or elevation: No Is the Current Pain Management Adequate: Adequate How does your wound impact your activities of daily livingo Sleep: No Bathing: No Appetite: No Relationship With Others: No Bladder Continence: No Emotions: No Bowel Continence: No Work: No Toileting: No Drive: No Dressing: No Hobbies: No Electronic Signature(s) Signed: 11/08/2019 5:44:09 PM By: Deon Pilling Entered By: Deon Pilling on 11/08/2019 13:15:14 -------------------------------------------------------------------------------- Wound Assessment Details Patient Name: Date of Service: [name], [name] 11/08/2019  1:00 PM Medical Record [id number] Patient Account Number: [id number] Date of Birth/Sex: Treating RN: May 30, 1946 (74 y.o. [name], Meta.Reding Primary Care Oskar Cretella: [name] Other Clinician: Referring Jaclyn Andy: Treating Pattiann Solanki/Extender:Robson, [name], [name] in Treatment: 16 Wound Status Wound Number: 10 Primary Venous Leg Ulcer Etiology: Wound Location: Right Lower Leg - Medial Wound Healed - Epithelialized Wounding Event: Blister Status: Date Acquired: 10/03/2019 Date Acquired: 10/03/2019 Comorbid Sleep Apnea, Congestive Heart Failure, Weeks Of Treatment: 2 History: Hypertension, Type II Diabetes, Neuropathy, Clustered Wound: No Received Chemotherapy Photos Wound Measurements Length: (cm) 0 % Reduct Width: (cm) 0 % Reduct Depth: (cm) 0 Epitheli Area: (cm) 0 Volume: (cm) 0 Wound Description Full Thickness Without Exposed Support Foul Odo Classification: Structures Slough/F Wound Flat and Intact Margin: Exudate None Present Amount: Wound Bed Granulation Amount: Medium (34-66%) Granulation Quality: Pink Fascia E Necrotic Amount: None Present (0%) Fat Laye Tendon E Muscle E Joint Ex Bone Exp r After Cleansing: No ibrino No Exposed Structure xposed: No r (Subcutaneous Tissue) Exposed: Yes xposed: No xposed: No posed: No osed: No ion in Area: 100% ion in Volume: 100% alization: Large (67-100%) Electronic Signature(s) Signed: 11/11/2019 4:40:42 PM By: Mikeal Hawthorne EMT/HBOT Signed: 11/11/2019 5:49:19 PM By: Deon Pilling Previous Signature: 11/08/2019 5:44:09 PM Version By: Deon Pilling Entered By: Mikeal Hawthorne on 11/11/2019 14:02:00 -------------------------------------------------------------------------------- Vitals Details Patient Name: Date of Service: [name] 11/08/2019 1:00 PM Medical Record [id number] Patient Account Number: [id number] Date of Birth/Sex: Treating RN: March 17, 1946 (74 y.o. [name],  Tammi Klippel Primary Care Selam Pietsch: [name] Other Clinician: Referring Zarin Knupp: Treating Mckenzye Cutright/Extender:Robson, [name], [name] in Treatment: 16 Vital Signs Time Taken: 13:14 Temperature (F): 98.3 Height (in): 77 Pulse (bpm): 55 Weight (lbs): 350 Respiratory Rate (breaths/min): 20 Body Mass Index (BMI): 41.5 Blood Pressure (mmHg): 132/63 Reference Range: 80 - 120 mg / dl Electronic Signature(s) Signed: 11/08/2019 5:44:09 PM By: Deon Pilling Entered By: Deon Pilling on 11/08/2019 13:14:47

## 2019-11-08 NOTE — Progress Notes (Signed)
[name], [name] ([id number]) Visit Report for 11/08/2019 HPI Details Patient Name: Date of Service: [name], [name] 11/08/2019 1:00 PM Medical Record [id number] Patient Account Number: [id number] Date of Birth/Sex: Treating RN: Sep 14, 1946 (74 y.o. M) Primary Care Provider: [name] Other Clinician: Referring Provider: Treating Provider/Extender:Jamyla Ard, [name], [name] in Treatment: 16 History of Present Illness HPI Description: ADMISSION 07/18/2019 Patient is a 74 year old man with type 2 diabetes and also chronic edema. He had a fall in July. Fairly substantial wound on the right medial knee and across the top of the patella. This is actually made some progress since then. He was seen in the ED on 8/18 with cellulitis. He was treated with doxycycline. This got extended I think for as long as 30 days. On 9/15 he was seen by his primary felt to have cellulitis with open wounds. He currently has 7 wounds the remnant of the initial contusion injury on the right medial knee which actually I think is actually quite a bit smaller than it was looking at a picture on his wife's cell phone. He also has 6 is wounds on the right lower extremity which includes 2 on the right anterior 3 on the right lateral and one posteriorly. He has compression stockings but has not been wearing them. He says these latter 6 wounds started as blisters that open. He has been using Neosporin. 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 He also has a laceration wound on the lower medial left  thigh just above the knee. We have been using Santyl on the latter, silver alginate on the wounds on the right. The patient does not have an open wound on the left and he has a lot less edema. On the left side. We have given him the number for elastic therapy in [place] for 20/30 stockings on the left leg. I am not certain that that will suffice on the right however 10/13; we received an urgent call from home health this morning reporting that the patient's right leg had a dusky color. We brought him into the clinic for review. He has severe bilateral right greater than left chronic venous insufficiency with secondary lymphedema and has discoloration on the right leg chronically related to this. He also reports he wrapped his upper leg with an Ace wrap and may have done this with excessive tension 10/22. The patient still has a clean wound on the upper anterior thigh the remanent of a very large traumatic wound. Small wound in the right posterior calf in the setting of severe chronic venous hypertension. There is nothing open on the left leg and he has stockings 11/12; I think since the last time the patient was here the area on the traumatic wound just above his knee scabbed over and was felt to be healed. During the course of wound preparation today our intake nurse washed the scab off there is still an open wound here. Also still a small area on the posterior calf on the right in the setting of severe chronic venous hypertension. He has not been dressing the right thigh wound. Using silver alginate on the other wounds 12/3; 3-week follow-up  he has. The area just above his knee anteriorly is improved however he has new areas on the right lateral knee and an area on the right lateral calf. I am not sure how this happened or when. The patient was not even sure when this happened. 12/17; the large area above his knee anteriorly and medially is completely closed today. The new areas from  last time included the right lateral knee and the right lateral calf. The right lateral calf is healed. He still has small superficial areas on the right medial knee 10/25/2019. We discharged the patient last time he was here. His wife states that within 4 days the swelling got to the point where she could not get the 20/30 compression stocking on the right leg. He subsequently opened up to a small open area medially. He is here for our review of that. He has his 20/30 below-knee stocking on the left which seems to be doing a decent job controlling the edema here. He has a large amount of edema in the right leg and we are fortunate that he only has a superficial medial wound. The patient saw his primary physician Dr. [name] on 12/17. He was felt to have cellulitis and put on Levaquin and doxy. Any degree of cellulitis he had then is now resolved 1/15; the patient is just about healed. Small open area remaining on the right leg we will put him back in compression. There is nothing on the left leg he has a loose fitting 20/30 stocking for the left leg. I think he is going to need better than this bilaterally if not currently then soon we will go ahead and order him bilateral external compression garments 1/22; the patient has no open wound on the right leg. He comes in with a juxta lite stocking for the right leg. I think he has a 20/30 below-knee on the left leg. Electronic Signature(s) Signed: 11/08/2019 5:37:40 PM By: Linton Ham MD Entered By: Linton Ham on 11/08/2019 13:39:37 -------------------------------------------------------------------------------- Physical Exam Details Patient Name: Date of Service: [name] 11/08/2019 1:00 PM Medical Record [id number] Patient Account Number: [id number] Date of Birth/Sex: Treating RN: 08/28/1946 (74 y.o. M) Primary Care Provider: [name] Other Clinician: Referring Provider: Treating Provider/Extender:Sira Adsit,  [name], [name] in Treatment: 16 Constitutional Sitting or standing Blood Pressure is within target range for patient.. Pulse regular and within target range for patient.Marland Kitchen Respirations regular, non-labored and within target range.. Temperature is normal and within the target range for the patient.Marland Kitchen Appears in no distress. Notes Wound examination the area on the right medial knee is closed right medial lower leg also closed. He has severe right greater than left chronic venous insufficiency with marked skin damage as a result of this Electronic Signature(s) Signed: 11/08/2019 5:37:40 PM By: Linton Ham MD Entered By: Linton Ham on 11/08/2019 13:40:22 -------------------------------------------------------------------------------- Physician Orders Details Patient Name: Date of Service: [name] 11/08/2019 1:00 PM Medical Record [id number] Patient Account Number: [id number] Date of Birth/Sex: Treating RN: July 03, 1946 (74 y.o. [name] Primary Care Provider: [name] Other Clinician: Referring Provider: Treating Provider/Extender:Whitnie Deleon, [name], [name] in Treatment: 16 Verbal / Phone Orders: No Diagnosis Coding Discharge From [facility] Discharge from [place] - call if wound re-opens. Edema Control Other: - Wear stocking to left lower leg and Juxta-Lite to the right lower leg Electronic Signature(s) Signed: 11/08/2019 5:37:40 PM By: Linton Ham MD Signed: 11/08/2019 5:39:40 PM By: Kela Millin Entered  By: Kela Millin on 11/08/2019 13:25:07 -------------------------------------------------------------------------------- Problem List Details Patient Name: Date of Service: [name], [name] 11/08/2019 1:00 PM Medical Record [id number] Patient Account Number: [id number] Date of Birth/Sex: Treating RN: Mar 26, 1946 (74 y.o. 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 07/18/2019 No Yes leg limited to breakdown of skin I87.323 Chronic venous hypertension (idiopathic) with 07/18/2019 No Yes inflammation of bilateral lower extremity E11.622 Type 2 diabetes mellitus with other skin ulcer 07/18/2019 No Yes Inactive Problems ICD-10 Code Description Active Date Inactive Date S81.011D Laceration without foreign body, right knee, subsequent 07/18/2019 07/18/2019 encounter X73.532 Non-pressure chronic ulcer of other part of right lower leg with 07/18/2019 07/18/2019 fat layer exposed Resolved Problems Electronic Signature(s) Signed: 11/08/2019 5:37:40 PM By: Linton Ham MD Entered By: Linton Ham on 11/08/2019 13:38:30 -------------------------------------------------------------------------------- Progress Note Details Patient Name: Date of Service: [name] 11/08/2019 1:00 PM Medical Record [id number] Patient Account Number: [id number] Date of Birth/Sex: Treating RN: Jun 03, 1946 (74 y.o. M) Primary Care Provider: [name] Other Clinician: Referring Provider: Treating Provider/Extender:Karsen Fellows, [name], [name] in Treatment: 16 Subjective History of Present Illness (HPI) ADMISSION 07/18/2019 Patient is a 74 year old man with type 2 diabetes and also chronic edema. He had a fall in July. Fairly substantial wound on the right medial knee and across the top of the patella. This is actually made some progress since then. He was seen in the ED on 8/18 with cellulitis. He was treated with doxycycline. This got extended I think for as long as 30 days. On 9/15 he was seen by his primary felt to have  cellulitis with open wounds. He currently has 7 wounds the remnant of the initial contusion injury on the right medial knee which actually I think is actually quite a bit smaller than it was looking at a picture on his wife's cell phone. He also has 6 is wounds on the right lower extremity which includes 2 on the right anterior 3 on the right lateral and one posteriorly. He has compression stockings but has not been wearing them. He says these latter 6 wounds started as blisters that open. He has been using Neosporin. 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 He also has a laceration wound on the lower medial left thigh just above the knee. We have been using Santyl on the latter, silver alginate on the wounds on the right. The patient does not have an open wound on the left and he has a lot less edema. On the left side. We have given him the number for elastic therapy in [place] for 20/30 stockings on the left leg. I am not certain that that will suffice on the right however 10/13; we received an urgent call from home health this morning reporting that the patient's right leg had a dusky color. We brought him into the clinic for review. He has severe bilateral right greater than left chronic venous  insufficiency with secondary lymphedema and has discoloration on the right leg chronically related to this. He also reports he wrapped his upper leg with an Ace wrap and may have done this with excessive tension 10/22. The patient still has a clean wound on the upper anterior thigh the remanent of a  very large traumatic wound. Small wound in the right posterior calf in the setting of severe chronic venous hypertension. There is nothing open on the left leg and he has stockings 11/12; I think since the last time the patient was here the area on the traumatic wound just above his knee scabbed over and was felt to be healed. During the course of wound preparation today our intake nurse washed the scab off there is still an open wound here. Also still a small area on the posterior calf on the right in the setting of severe chronic venous hypertension. He has not been dressing the right thigh wound. Using silver alginate on the other wounds 12/3; 3-week follow-up he has. The area just above his knee anteriorly is improved however he has new areas on the right lateral knee and an area on the right lateral calf. I am not sure how this happened or when. The patient was not even sure when this happened. 12/17; the large area above his knee anteriorly and medially is completely closed today. The new areas from last time included the right lateral knee and the right lateral calf. The right lateral calf is healed. He still has small superficial areas on the right medial knee 10/25/2019. We discharged the patient last time he was here. His wife states that within 4 days the swelling got to the point where she could not get the 20/30 compression stocking on the right leg. He subsequently opened up to a small open area medially. He is here for our review of that. He has his 20/30 below-knee stocking on the left which seems to be doing a decent job controlling the edema here. He has a large amount of edema in the right leg and we are fortunate that he only has a superficial medial wound. The patient saw his primary physician Dr. [name] on 12/17. He was felt to have cellulitis and put on Levaquin and doxy. Any degree of cellulitis he had then is now resolved 1/15; the patient is just about healed. Small open  area remaining on the right leg we will put him back in compression. There is nothing on the left leg he has a loose fitting 20/30 stocking for the left leg. I think he is going to need better than this bilaterally if not currently then soon we will go ahead and order him bilateral external compression garments 1/22; the patient has no open wound on the right leg. He comes in with a juxta lite stocking for the right leg. I think he has a 20/30 below-knee on the left leg. Objective Constitutional Sitting or standing Blood Pressure is within target range for patient.. Pulse regular and within target range for patient.Marland Kitchen Respirations regular, non-labored and within target range.. Temperature is normal and within the target range for the patient.Marland Kitchen Appears in no distress. Vitals Time Taken: 1:14 PM, Height: 77 in, Weight: 350 lbs, BMI: 41.5, Temperature: 98.3 F, Pulse: 55 bpm, Respiratory Rate: 20 breaths/min, Blood Pressure: 132/63 mmHg. General Notes: Wound examination the area on the right medial knee is closed right medial lower leg also closed. He has severe right greater than left chronic venous insufficiency with marked  skin damage as a result of this Integumentary (Hair, Skin) Wound #10 status is Healed - Epithelialized. Original cause of wound was Blister. The wound is located on the Right,Medial Lower Leg. The wound measures 0cm length x 0cm width x 0cm depth; 0cm^2 area and 0cm^3 volume. Assessment Active Problems ICD-10 Lymphedema, not elsewhere classified Non-pressure chronic ulcer of other part of right lower leg limited to breakdown of skin Chronic venous hypertension (idiopathic) with inflammation of bilateral lower extremity Type 2 diabetes mellitus with other skin ulcer Plan Discharge From [facility]: Discharge from [place] - call if wound re-opens. Edema Control: Other: - Wear stocking to left lower leg and Juxta-Lite to the right lower leg 1. We went over  the use of the stockings with the patient 2. Skin lubrication 3. Leg elevation when he is sitting 4. He can be discharged from the clinic Electronic Signature(s) Signed: 11/08/2019 5:37:40 PM By: Linton Ham MD Entered By: Linton Ham on 11/08/2019 13:40:52 -------------------------------------------------------------------------------- SuperBill Details Patient Name: Date of Service: [name] 11/08/2019 Medical Record [id number] Patient Account Number: [id number] Date of Birth/Sex: Treating RN: 02-11-1946 (74 y.o. [name] Primary Care Provider: [name] Other Clinician: Referring Provider: Treating Provider/Extender:Anaiah Mcmannis, [name], [name] in Treatment: 16 Diagnosis Coding ICD-10 Codes Code Description I89.0 Lymphedema, not elsewhere classified L97.811 Non-pressure chronic ulcer of other part of right lower leg limited to breakdown of skin I87.323 Chronic venous hypertension (idiopathic) with inflammation of bilateral lower extremity E11.622 Type 2 diabetes mellitus with other skin ulcer Facility Procedures The patient participates with Medicare or their insurance follows the Medicare Facility Guidelines: CPT4 Code Description Modifier Quantity [id number] [place] VISIT-LEV 3 EST PT 1 Physician Procedures CPT4 Code Description: [id number] - WC PHYS LEVEL 2 - EST PT ICD-10 Diagnosis Description I89.0 Lymphedema, not elsewhere classified L97.811 Non-pressure chronic ulcer of other part of right lower leg skin I87.323 Chronic venous hypertension  (idiopathic) with inflammation extremity Modifier: limited to breakdow of bilateral lower Quantity: 1 n of Electronic Signature(s) Signed: 11/08/2019 5:37:40 PM By: Linton Ham MD Entered By: Linton Ham on 11/08/2019 13:41:08

## 2019-11-15 ENCOUNTER — Other Ambulatory Visit: Payer: Self-pay | Admitting: Family Medicine

## 2019-11-17 ENCOUNTER — Ambulatory Visit: Payer: Medicare Other

## 2019-11-22 ENCOUNTER — Ambulatory Visit: Payer: Medicare Other

## 2019-11-26 ENCOUNTER — Ambulatory Visit: Payer: Medicare Other

## 2019-11-28 ENCOUNTER — Ambulatory Visit: Payer: Medicare Other

## 2019-12-27 ENCOUNTER — Ambulatory Visit: Payer: Medicare Other

## 2019-12-31 ENCOUNTER — Encounter (HOSPITAL_BASED_OUTPATIENT_CLINIC_OR_DEPARTMENT_OTHER): Payer: Medicare Other | Attending: Internal Medicine | Admitting: Internal Medicine

## 2019-12-31 ENCOUNTER — Other Ambulatory Visit: Payer: Self-pay

## 2019-12-31 DIAGNOSIS — L97821 Non-pressure chronic ulcer of other part of left lower leg limited to breakdown of skin: Secondary | ICD-10-CM | POA: Diagnosis not present

## 2019-12-31 DIAGNOSIS — I5042 Chronic combined systolic (congestive) and diastolic (congestive) heart failure: Secondary | ICD-10-CM | POA: Insufficient documentation

## 2019-12-31 DIAGNOSIS — I480 Paroxysmal atrial fibrillation: Secondary | ICD-10-CM | POA: Insufficient documentation

## 2019-12-31 DIAGNOSIS — E1140 Type 2 diabetes mellitus with diabetic neuropathy, unspecified: Secondary | ICD-10-CM | POA: Diagnosis not present

## 2019-12-31 DIAGNOSIS — E1151 Type 2 diabetes mellitus with diabetic peripheral angiopathy without gangrene: Secondary | ICD-10-CM | POA: Insufficient documentation

## 2019-12-31 DIAGNOSIS — I87332 Chronic venous hypertension (idiopathic) with ulcer and inflammation of left lower extremity: Secondary | ICD-10-CM | POA: Insufficient documentation

## 2019-12-31 DIAGNOSIS — E11621 Type 2 diabetes mellitus with foot ulcer: Secondary | ICD-10-CM | POA: Insufficient documentation

## 2019-12-31 DIAGNOSIS — I429 Cardiomyopathy, unspecified: Secondary | ICD-10-CM | POA: Diagnosis not present

## 2019-12-31 DIAGNOSIS — Z9221 Personal history of antineoplastic chemotherapy: Secondary | ICD-10-CM | POA: Insufficient documentation

## 2019-12-31 DIAGNOSIS — I110 Hypertensive heart disease with heart failure: Secondary | ICD-10-CM | POA: Diagnosis not present

## 2019-12-31 DIAGNOSIS — I890 Lymphedema, not elsewhere classified: Secondary | ICD-10-CM | POA: Diagnosis not present

## 2019-12-31 NOTE — Progress Notes (Signed)
[name], [name] ([id number]) Visit Report for 12/31/2019 Abuse/Suicide Risk Screen Details Patient Name: Date of Service: [name], [name] 12/31/2019 10:30 AM Medical Record [id number] Patient Account Number: [id number] Date of Birth/Sex: Treating RN: 12/28/1945 (74 y.o. [name] Primary Care Mareesa Gathright: [name] Other Clinician: Referring Minda Faas: Treating Audi Conover/Extender:Robson, [name], [name] in Treatment: 0 Abuse/Suicide Risk Screen Items Answer ABUSE RISK SCREEN: Has anyone close to you tried to hurt or harm you recentlyo No Do you feel uncomfortable with anyone in your familyo No Has anyone forced you do things that you didnt want to doo No Electronic Signature(s) Signed: 12/31/2019 5:21:02 PM By: Baruch Gouty RN, BSN Entered By: Baruch Gouty on 12/31/2019 11:15:33 -------------------------------------------------------------------------------- Activities of Daily Living Details Patient Name: Date of Service: [name], [name] 12/31/2019 10:30 AM Medical Record [id number] Patient Account Number: [id number] Date of Birth/Sex: Treating RN: 11-02-1945 (74 y.o. [name] Primary Care Tomara Youngberg: [name] Other Clinician: Referring Lillianna Sabel: Treating Phila Shoaf/Extender:Robson, [name], [name] in Treatment: 0 Activities of Daily Living Items Answer Activities of Daily Living (Please select one for each item) Drive Automobile Not Able Take Medications Completely Able Use Telephone Completely Able Care for Appearance Completely Able Use Toilet Completely Able Bath / Shower Completely Able Dress Self Completely Able Feed Self Completely Able Walk Need Assistance Get In / Out Bed Completely Able Housework Need Assistance Prepare Meals Need Assistance Handle Money Completely Able Shop for Self Need Assistance Electronic Signature(s) Signed: 12/31/2019 5:21:02 PM By: Baruch Gouty RN,  BSN Entered By: Baruch Gouty on 12/31/2019 11:16:04 -------------------------------------------------------------------------------- Education Screening Details Patient Name: Date of Service: [name] 12/31/2019 10:30 AM Medical Record [id number] Patient Account Number: [id number] Date of Birth/Sex: Treating RN: 04/07/1946 (74 y.o. [name] Primary Care Meta Kroenke: [name] Other Clinician: Referring Vaiden Adames: Treating Keilyn Haggard/Extender:Robson, [name], [name] in Treatment: 0 Primary Learner Assessed: Patient Learning Preferences/Education Level/Primary Language Learning Preference: Explanation, Demonstration, Printed Material Highest Education Level: College or Above Preferred Language: English Cognitive Barrier Language Barrier: No Translator Needed: No Memory Deficit: No Emotional Barrier: No Cultural/Religious Beliefs Affecting Medical Care: No Physical Barrier Impaired Vision: No Impaired Hearing: No Decreased Hand dexterity: No Knowledge/Comprehension Knowledge Level: High Comprehension Level: High Ability to understand written High instructions: Ability to understand verbal High instructions: Motivation Anxiety Level: Calm Cooperation: Cooperative Education Importance: Acknowledges Need Interest in Health Problems: Asks Questions Perception: Coherent Willingness to Engage in Self- High Management Activities: Readiness to Engage in Self- High Management Activities: Electronic Signature(s) Signed: 12/31/2019 5:21:02 PM By: Baruch Gouty RN, BSN Entered By: Baruch Gouty on 12/31/2019 11:16:45 -------------------------------------------------------------------------------- Fall Risk Assessment Details Patient Name: Date of Service: [name] 12/31/2019 10:30 AM Medical Record [id number] Patient Account Number: [id number] Date of Birth/Sex: Treating RN: 23-Oct-1945 (74 y.o. [name] Primary Care Conrado Nance: [name] Other Clinician: Referring Santanna Olenik: Treating Haris Baack/Extender:Robson, [name], [name] in Treatment: 0 Fall Risk Assessment Items Have you had 2 or more falls in the last 12 monthso 0 Yes Have you had any fall that resulted in injury in the last 12 monthso 0 No FALLS RISK SCREEN History of falling - immediate or within 3 months 0 No Secondary diagnosis (Do you have 2 or more medical diagnoseso) 0 No Ambulatory aid None/bed rest/wheelchair/nurse 0 No Crutches/cane/walker 15 Yes Furniture 0 No Intravenous therapy Access/Saline/Heparin Lock 0 No Weak (short steps with or without shuffle, stooped but able to lift head 10 Yes  while walking, may seek support from furniture) Impaired (short steps with shuffle, may have difficulty arising from chair, 0 No head down, impaired balance) Mental Status Oriented to own ability 0 Yes Overestimates or forgets limitations 0 No Risk Level: Medium Risk Score: 25 Electronic Signature(s) Signed: 12/31/2019 5:21:02 PM By: Baruch Gouty RN, BSN Entered By: Baruch Gouty on 12/31/2019 11:17:19 -------------------------------------------------------------------------------- Foot Assessment Details Patient Name: Date of Service: [name] 12/31/2019 10:30 AM Medical Record [id number] Patient Account Number: [id number] Date of Birth/Sex: Treating RN: Dec 06, 1945 (74 y.o. [name] Primary Care Demitrus Francisco: [name] Other Clinician: Referring Leoda Smithhart: Treating Adarryl Goldammer/Extender:Robson, [name], [name] in Treatment: 0 Foot Assessment Items Site Locations + = Sensation present, - = Sensation absent, C = Callus, U = Ulcer R = Redness, W = Warmth, M = Maceration, PU = Pre-ulcerative lesion F = Fissure, S = Swelling, D = Dryness Assessment Right: Left: Other Deformity: No No Prior Foot Ulcer: No No Prior Amputation: No No Charcot Joint: No  No Ambulatory Status: Ambulatory With Help Assistance Device: Walker Gait: Steady Electronic Signature(s) Signed: 12/31/2019 5:21:02 PM By: Baruch Gouty RN, BSN Entered By: Baruch Gouty on 12/31/2019 11:19:36 -------------------------------------------------------------------------------- Nutrition Risk Screening Details Patient Name: Date of Service: [name], [name] 12/31/2019 10:30 AM Medical Record [id number] Patient Account Number: [id number] Date of Birth/Sex: Treating RN: 1946/05/04 (74 y.o. [name] Primary Care Jamiracle Avants: [name] Other Clinician: Referring Selen Smucker: Treating Murray Durrell/Extender:Robson, [name], [name] in Treatment: 0 Height (in): 76 Weight (lbs): 323 Body Mass Index (BMI): 39.3 Nutrition Risk Screening Items Score Screening NUTRITION RISK SCREEN: I have an illness or condition that made me change the kind and/or 0 No amount of food I eat I eat fewer than two meals per day 0 No I eat few fruits and vegetables, or milk products 0 No I have three or more drinks of beer, liquor or wine almost every day 0 No I have tooth or mouth problems that make it hard for me to eat 0 No I don't always have enough money to buy the food I need 0 No I eat alone most of the time 0 No I take three or more different prescribed or over-the-counter drugs a day 1 Yes 0 No Without wanting to, I have lost or gained 10 pounds in the last six months I am not always physically able to shop, cook and/or feed myself 0 No Nutrition Protocols Good Risk Protocol 0 No interventions needed Moderate Risk Protocol High Risk Proctocol Risk Level: Good Risk Score: 1 Electronic Signature(s) Signed: 12/31/2019 5:21:02 PM By: Baruch Gouty RN, BSN Entered By: Baruch Gouty on 12/31/2019 11:17:37

## 2020-01-01 NOTE — Progress Notes (Signed)
[name] ([id number]) Visit Report for 12/31/2019 HPI Details Patient Name: Date of Service: [name], [name] 12/31/2019 10:30 AM Medical Record [id number] Patient Account Number: [id number] Date of Birth/Sex: Treating RN: 03-Oct-1946 (73 y.o. [name]) Carlene Coria Primary Care Provider: [name] Other Clinician: Referring Provider: Treating Provider/Extender:Meleni Delahunt, [name], [name] in Treatment: 0 History of Present Illness HPI Description: ADMISSION 07/18/2019 Patient is a 74 year old man with type 2 diabetes and also chronic edema. He had a fall in July. Fairly substantial wound on the right medial knee and across the top of the patella. This is actually made some progress since then. He was seen in the ED on 8/18 with cellulitis. He was treated with doxycycline. This got extended I think for as long as 30 days. On 9/15 he was seen by his primary felt to have cellulitis with open wounds. He currently has 7 wounds the remnant of the initial contusion injury on the right medial knee which actually I think is actually quite a bit smaller than it was looking at a picture on his wife's cell phone. He also has 6 is wounds on the right lower extremity which includes 2 on the right anterior 3 on the right lateral and one posteriorly. He has compression stockings but has not been wearing them. He says these latter 6 wounds started as blisters that open. He has been using Neosporin. 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 He also has a laceration wound on the lower  medial left thigh just above the knee. We have been using Santyl on the latter, silver alginate on the wounds on the right. The patient does not have an open wound on the left and he has a lot less edema. On the left side. We have given him the number for elastic therapy in [place] for 20/30 stockings on the left leg. I am not certain that that will suffice on the right however 10/13; we received an urgent call from home health this morning reporting that the patient's right leg had a dusky color. We brought him into the clinic for review. He has severe bilateral right greater than left chronic venous insufficiency with secondary lymphedema and has discoloration on the right leg chronically related to this. He also reports he wrapped his upper leg with an Ace wrap and may have done this with excessive tension 10/22. The patient still has a clean wound on the upper anterior thigh the remanent of a very large traumatic wound. Small wound in the right posterior calf in the setting of severe chronic venous hypertension. There is nothing open on the left leg and he has stockings 11/12; I think since the last time the patient was here the area on the traumatic wound just above his knee scabbed over and was felt to be healed. During the course of wound preparation today our intake nurse washed the scab off there is still an open wound here. Also still a small area on the posterior calf on the right in the setting of severe chronic venous hypertension. He has not been dressing the right thigh wound. Using silver alginate on the other wounds 12/3;  3-week follow-up he has. The area just above his knee anteriorly is improved however he has new areas on the right lateral knee and an area on the right lateral calf. I am not sure how this happened or when. The patient was not even sure when this happened. 12/17; the large area above his knee anteriorly and medially is completely closed today. The new areas  from last time included the right lateral knee and the right lateral calf. The right lateral calf is healed. He still has small superficial areas on the right medial knee 10/25/2019. We discharged the patient last time he was here. His wife states that within 4 days the swelling got to the point where she could not get the 20/30 compression stocking on the right leg. He subsequently opened up to a small open area medially. He is here for our review of that. He has his 20/30 below-knee stocking on the left which seems to be doing a decent job controlling the edema here. He has a large amount of edema in the right leg and we are fortunate that he only has a superficial medial wound. The patient saw his primary physician Dr. [name] on 12/17. He was felt to have cellulitis and put on Levaquin and doxy. Any degree of cellulitis he had then is now resolved 1/15; the patient is just about healed. Small open area remaining on the right leg we will put him back in compression. There is nothing on the left leg he has a loose fitting 20/30 stocking for the left leg. I think he is going to need better than this bilaterally if not currently then soon we will go ahead and order him bilateral external compression garments 1/22; the patient has no open wound on the right leg. He comes in with a juxta lite stocking for the right leg. I think he has a 20/30 below-knee on the left leg. READMISSION 12/31/2019 This is a patient we know fairly well. He has bilateral severe chronic venous insufficiency with secondary lymphedema. The time he was in the clinic he had wounds on the right leg not the left. The end he left with a juxta lite stocking for the right leg but a 20/30 below-knee stocking on the left. Over the last 2 to 3 weeks he started noting noticing left leg blisters and he has 2 anterior wounds on the left anterior lower extremity. These are superficial he has been using Neosporin with some type of  covering. His past medical history is unchanged. He has type 2 diabetes on oral agents, chronic venous insufficiency, lymphedema, hypertension, obstructive sleep apnea, remote history of non-Hodgkin's lymphoma, history of CHF both diastolic and systolic and paroxysmal A. fib ABI in our clinic 0.42 on the left Electronic Signature(s) Signed: 12/31/2019 5:29:05 PM By: Linton Ham MD Entered By: Linton Ham on 12/31/2019 12:49:51 -------------------------------------------------------------------------------- Physical Exam Details Patient Name: Date of Service: [name] 12/31/2019 10:30 AM Medical Record [id number] Patient Account Number: [id number] Date of Birth/Sex: Treating RN: Mar 27, 1946 (73 y.o. Oval Linsey Primary Care Provider: [name] Other Clinician: Referring Provider: Treating Provider/Extender:Dynver Clemson, [name], [name] in Treatment: 0 Constitutional Sitting or standing Blood Pressure is within target range for patient.. Pulse regular and within target range for patient.Marland Kitchen Respirations regular, non-labored and within target range.. Temperature is normal and within the target range for the patient.Marland Kitchen Appears in no distress. Respiratory work of breathing is normal. Bilateral breath sounds are clear and equal in all lobes  with no wheezes, rales or rhonchi.. Cardiovascular No evidence of CHF heart sounds are normal JVP is not elevated. Pedal pulses are palpable. Very poorly controlled edema in the left leg this is pitting. No signs of a DVT no warmth no tenderness. Integumentary (Hair, Skin) Skin changes of chronic venous insufficiency in the bilateral lower legs. Psychiatric appears at normal baseline. Notes Wound exam; the patient has 2 small superficial wounds on the anterior lower extremity. The larger one is anterior smaller one distally. Surfaces of these wounds look satisfactory. No evidence of surrounding infection no  erythema no tenderness Electronic Signature(s) Signed: 12/31/2019 5:29:05 PM By: Linton Ham MD Entered By: Linton Ham on 12/31/2019 12:51:58 -------------------------------------------------------------------------------- Physician Orders Details Patient Name: Date of Service: [name] 12/31/2019 10:30 AM Medical Record [id number] Patient Account Number: [id number] Date of Birth/Sex: Treating RN: 19-Oct-1945 (73 y.o. Oval Linsey Primary Care Provider: [name] Other Clinician: Referring Provider: Treating Provider/Extender:Luverne Zerkle, [name], [name] in Treatment: 0 Verbal / Phone Orders: No Diagnosis Coding Follow-up Appointments Return Appointment in 1 week. Dressing Change Frequency Do not change entire dressing for one week. Skin Barriers/Peri-Wound Care TCA Cream or Ointment Wound Cleansing Wound #11 Left,Distal,Anterior Lower Leg Clean wound with Wound Cleanser May shower with protection. Wound #12 Left,Anterior Lower Leg Clean wound with Wound Cleanser May shower with protection. Primary Wound Dressing Wound #11 Left,Distal,Anterior Lower Leg Calcium Alginate with Silver Wound #12 Left,Anterior Lower Leg Calcium Alginate with Silver Secondary Dressing Wound #11 Left,Distal,Anterior Lower Leg Dry Gauze ABD pad Wound #12 Left,Anterior Lower Leg Dry Gauze ABD pad Edema Control 3 Layer Compression System - Left Lower Extremity Electronic Signature(s) Signed: 12/31/2019 5:29:05 PM By: Linton Ham MD Signed: 01/01/2020 8:57:53 AM By: Carlene Coria RN Entered By: Carlene Coria on 12/31/2019 12:02:50 -------------------------------------------------------------------------------- Problem List Details Patient Name: Date of Service: [name] 12/31/2019 10:30 AM Medical Record [id number] Patient Account Number: [id number] Date of Birth/Sex: Treating RN: 1946/02/12 (73 y.o. Staci Acosta, Morey Hummingbird Primary Care  Provider: [name] Other Clinician: Referring Provider: Treating Provider/Extender:Rishard Delange, [name], [name] in Treatment: 0 Active Problems ICD-10 Evaluated Encounter Code Description Active Date Today Diagnosis I87.332 Chronic venous hypertension (idiopathic) with ulcer 12/31/2019 No Yes and inflammation of left lower extremity I89.0 Lymphedema, not elsewhere classified 12/31/2019 No Yes L97.821 Non-pressure chronic ulcer of other part of left lower 12/31/2019 No Yes leg limited to breakdown of skin Inactive Problems Resolved Problems Electronic Signature(s) Signed: 12/31/2019 5:29:05 PM By: Linton Ham MD Entered By: Linton Ham on 12/31/2019 12:35:44 -------------------------------------------------------------------------------- Progress Note Details Patient Name: Date of Service: [name] 12/31/2019 10:30 AM Medical Record [id number] Patient Account Number: [id number] Date of Birth/Sex: Treating RN: 09/12/1946 (73 y.o. Oval Linsey Primary Care Provider: [name] Other Clinician: Referring Provider: Treating Provider/Extender:Keylie Beavers, [name], [name] in Treatment: 0 Subjective History of Present Illness (HPI) ADMISSION 07/18/2019 Patient is a 74 year old man with type 2 diabetes and also chronic edema. He had a fall in July. Fairly substantial wound on the right medial knee and across the top of the patella. This is actually made some progress since then. He was seen in the ED on 8/18 with cellulitis. He was treated with doxycycline. This got extended I think for as long as 30 days. On 9/15 he was seen by his primary felt to have cellulitis with open wounds. He currently has 7 wounds the remnant of the initial contusion injury on the right medial knee which  actually I think is actually quite a bit smaller than it was looking at a picture on his wife's cell phone. He also has 6 is wounds on the right  lower extremity which includes 2 on the right anterior 3 on the right lateral and one posteriorly. He has compression stockings but has not been wearing them. He says these latter 6 wounds started as blisters that open. He has been using Neosporin. 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 He also has a laceration wound on the lower medial left thigh just above the knee. We have been using Santyl on the latter, silver alginate on the wounds on the right. The patient does not have an open wound on the left and he has a lot less edema. On the left side. We have given him the number for elastic therapy in [place] for 20/30 stockings on the left leg. I am not certain that that will suffice on the right however 10/13; we received an urgent call from home health this morning reporting that the patient's right leg had a dusky color. We brought him into the clinic for review. He has severe bilateral right greater than left chronic venous insufficiency with secondary lymphedema and has discoloration on the right leg chronically related to this. He also reports he wrapped his upper leg with an Ace wrap and may have done this with excessive tension 10/22. The patient still has a clean wound on the upper anterior thigh the remanent of a very large traumatic wound. Small wound in the right posterior calf in the setting of severe chronic venous hypertension. There is nothing open on the left leg and he has stockings 11/12; I think since the last time the patient was here the area on the traumatic wound just  above his knee scabbed over and was felt to be healed. During the course of wound preparation today our intake nurse washed the scab off there is still an open wound here. Also still a small area on the posterior calf on the right in the setting of severe chronic venous hypertension. He has not been dressing the right thigh wound. Using silver alginate on the other wounds 12/3; 3-week follow-up he has. The area just above his knee anteriorly is improved however he has new areas on the right lateral knee and an area on the right lateral calf. I am not sure how this happened or when. The patient was not even sure when this happened. 12/17; the large area above his knee anteriorly and medially is completely closed today. The new areas from last time included the right lateral knee and the right lateral calf. The right lateral calf is healed. He still has small superficial areas on the right medial knee 10/25/2019. We discharged the patient last time he was here. His wife states that within 4 days the swelling got to the point where she could not get the 20/30 compression stocking on the right leg. He subsequently opened up to a small open area medially. He is here for our review of that. He has his 20/30 below-knee stocking on the left which seems to be doing a decent job  controlling the edema here. He has a large amount of edema in the right leg and we are fortunate that he only has a superficial medial wound. The patient saw his primary physician Dr. [name] on 12/17. He was felt to have cellulitis and put on Levaquin and doxy. Any degree of cellulitis he had then is now resolved 1/15; the patient is just about healed. Small open area remaining on the right leg we will put him back in compression. There is nothing on the left leg he has a loose fitting 20/30 stocking for the left leg. I think he is going to need better than this bilaterally if not currently then soon we will go ahead and order him  bilateral external compression garments 1/22; the patient has no open wound on the right leg. He comes in with a juxta lite stocking for the right leg. I think he has a 20/30 below-knee on the left leg. READMISSION 12/31/2019 This is a patient we know fairly well. He has bilateral severe chronic venous insufficiency with secondary lymphedema. The time he was in the clinic he had wounds on the right leg not the left. The end he left with a juxta lite stocking for the right leg but a 20/30 below-knee stocking on the left. Over the last 2 to 3 weeks he started noting noticing left leg blisters and he has 2 anterior wounds on the left anterior lower extremity. These are superficial he has been using Neosporin with some type of covering. His past medical history is unchanged. He has type 2 diabetes on oral agents, chronic venous insufficiency, lymphedema, hypertension, obstructive sleep apnea, remote history of non-Hodgkin's lymphoma, history of CHF both diastolic and systolic and paroxysmal A. fib ABI in our clinic 0.42 on the left Patient History Information obtained from Patient. Allergies No Known Drug Allergies Family History Cancer - Father,Maternal Grandparents,Child, Diabetes - Father,Paternal Grandparents, Heart Disease - Paternal Grandparents,Mother, Hypertension - Mother, Lung Disease - Maternal Grandparents, Stroke - Maternal Grandparents, No family history of Hereditary Spherocytosis, Kidney Disease, Seizures, Thyroid Problems, Tuberculosis. Social History Former smoker - quit in 1992, Marital Status - Married, Alcohol Use - Rarely, Drug Use - No History, Caffeine Use - Moderate. Medical History Eyes Denies history of Cataracts, Glaucoma, Optic Neuritis Respiratory Patient has history of Sleep Apnea Cardiovascular Patient has history of Congestive Heart Failure, Hypertension Endocrine Patient has history of Type II Diabetes Denies history of Type I  Diabetes Genitourinary Denies history of End Stage Renal Disease Integumentary (Skin) Denies history of History of Burn Neurologic Patient has history of Neuropathy Oncologic Patient has history of Received Chemotherapy Denies history of Received Radiation Psychiatric Denies history of Anorexia/bulimia, Confinement Anxiety Medical And Surgical History Notes Constitutional Symptoms (General Health) morbid obesity Cardiovascular Cardiomyopathy, Hyperlipidemia Oncologic 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 Review of Systems (ROS) Constitutional Symptoms (General Health) Denies complaints or symptoms of Fatigue, Fever, Chills, Marked Weight Change. Eyes Complains or has symptoms of Glasses / Contacts - reading. Denies complaints or symptoms of Dry Eyes, Vision Changes. Ear/Nose/Mouth/Throat Denies complaints or symptoms of Chronic sinus problems or rhinitis. Respiratory Denies complaints or symptoms of Chronic or frequent coughs, Shortness of Breath. Cardiovascular Denies complaints or symptoms of Chest pain. Gastrointestinal Denies complaints or symptoms of Frequent diarrhea, Nausea, Vomiting. Genitourinary Denies complaints or symptoms of Frequent urination. Integumentary (Skin) Complains or has symptoms of Wounds - left lower leg. Musculoskeletal Denies complaints or symptoms of Muscle Pain, Muscle Weakness. Neurologic Denies complaints or symptoms of Numbness/parasthesias.  Psychiatric Denies complaints or symptoms of Claustrophobia, Suicidal. Objective Constitutional Sitting or standing Blood Pressure is within target range for patient.. Pulse regular and within target range for patient.Marland Kitchen Respirations regular, non-labored and within target range.. Temperature is normal and within the target range for the patient.Marland Kitchen Appears in no distress. Vitals Time Taken: 11:11 AM, Height: 76 in, Source: Stated, Weight: 323 lbs, Source: Stated, BMI: 39.3, Temperature: 98.3 F, Pulse:  52 bpm, Respiratory Rate: 20 breaths/min, Blood Pressure: 132/67 mmHg. Respiratory work of breathing is normal. Bilateral breath sounds are clear and equal in all lobes with no wheezes, rales or rhonchi.. Cardiovascular No evidence of CHF heart sounds are normal JVP is not elevated. Pedal pulses are palpable. Very poorly controlled edema in the left leg this is pitting. No signs of a DVT no warmth no tenderness. Psychiatric appears at normal baseline. General Notes: Wound exam; the patient has 2 small superficial wounds on the anterior lower extremity. The larger one is anterior smaller one distally. Surfaces of these wounds look satisfactory. No evidence of surrounding infection no erythema no tenderness Integumentary (Hair, Skin) Skin changes of chronic venous insufficiency in the bilateral lower legs. Wound #11 status is Open. Original cause of wound was Gradually Appeared. The wound is located on the [facility] Lower Leg. The wound measures 1.4cm length x 1.6cm width x 0.1cm depth; 1.759cm^2 area and 0.176cm^3 volume. There is Fat Layer (Subcutaneous Tissue) Exposed exposed. There is no tunneling or undermining noted. There is a medium amount of serous drainage noted. The wound margin is flat and intact. There is large (67-100%) red granulation within the wound bed. There is no necrotic tissue within the wound bed. Wound #12 status is Open. Original cause of wound was Gradually Appeared. The wound is located on the Left,Anterior Lower Leg. The wound measures 1.1cm length x 0.4cm width x 0.1cm depth; 0.346cm^2 area and 0.035cm^3 volume. There is Fat Layer (Subcutaneous Tissue) Exposed exposed. There is no tunneling or undermining noted. There is a small amount of serous drainage noted. The wound margin is flat and intact. There is large (67-100%) red granulation within the wound bed. There is no necrotic tissue within the wound bed. Assessment Active Problems ICD-10 Chronic  venous hypertension (idiopathic) with ulcer and inflammation of left lower extremity Lymphedema, not elsewhere classified Non-pressure chronic ulcer of other part of left lower leg limited to breakdown of skin Procedures Wound #11 Pre-procedure diagnosis of Wound #11 is a Venous Leg Ulcer located on the Left,Distal,Anterior Lower Leg . There was a Three Layer Compression Therapy Procedure by Carlene Coria, RN. Post procedure Diagnosis Wound #11: Same as Pre-Procedure Wound #12 Pre-procedure diagnosis of Wound #12 is a Venous Leg Ulcer located on the Left,Anterior Lower Leg . There was a Three Layer Compression Therapy Procedure by Carlene Coria, RN. Post procedure Diagnosis Wound #12: Same as Pre-Procedure Plan Follow-up Appointments: Return Appointment in 1 week. Dressing Change Frequency: Do not change entire dressing for one week. Skin Barriers/Peri-Wound Care: TCA Cream or Ointment Wound Cleansing: Wound #11 Left,Distal,Anterior Lower Leg: Clean wound with Wound Cleanser May shower with protection. Wound #12 Left,Anterior Lower Leg: Clean wound with Wound Cleanser May shower with protection. Primary Wound Dressing: Wound #11 Left,Distal,Anterior Lower Leg: Calcium Alginate with Silver Wound #12 Left,Anterior Lower Leg: Calcium Alginate with Silver Secondary Dressing: Wound #11 Left,Distal,Anterior Lower Leg: Dry Gauze ABD pad Wound #12 Left,Anterior Lower Leg: Dry Gauze ABD pad Edema Control: 3 Layer Compression System - Left Lower Extremity 1. We put silver  alginate on the 2 wounds on the anterior leg and put him in 3 layer compression 2. We are going to have to get a external compression stocking for the left leg at discharge. Clearly 20/30 stockings were insufficient here. 3. He has a fair amount of pitting edema in the left leg but I think this is all secondary to chronic venous insufficiency. I saw no evidence of systemic fluid volume overload, , DVT or  cellulitis Electronic Signature(s) Signed: 12/31/2019 5:29:05 PM By: Linton Ham MD Entered By: Linton Ham on 12/31/2019 12:53:40 -------------------------------------------------------------------------------- HxROS Details Patient Name: Date of Service: [name] 12/31/2019 10:30 AM Medical Record [id number] Patient Account Number: [id number] Date of Birth/Sex: Treating RN: 02/06/1946 (74 y.o. Ernestene Mention Primary Care Provider: [name] Other Clinician: Referring Provider: Treating Provider/Extender:Bobbi Yount, [name], [name] in Treatment: 0 Information Obtained From Patient Constitutional Symptoms (General Health) Complaints and Symptoms: Negative for: Fatigue; Fever; Chills; Marked Weight Change Medical History: Past Medical History Notes: morbid obesity Eyes Complaints and Symptoms: Positive for: Glasses / Contacts - reading Negative for: Dry Eyes; Vision Changes Medical History: Negative for: Cataracts; Glaucoma; Optic Neuritis Ear/Nose/Mouth/Throat Complaints and Symptoms: Negative for: Chronic sinus problems or rhinitis Respiratory Complaints and Symptoms: Negative for: Chronic or frequent coughs; Shortness of Breath Medical History: Positive for: Sleep Apnea Cardiovascular Complaints and Symptoms: Negative for: Chest pain Medical History: Positive for: Congestive Heart Failure; Hypertension Past Medical History Notes: Cardiomyopathy, Hyperlipidemia Gastrointestinal Complaints and Symptoms: Negative for: Frequent diarrhea; Nausea; Vomiting Genitourinary Complaints and Symptoms: Negative for: Frequent urination Medical History: Negative for: End Stage Renal Disease Integumentary (Skin) Complaints and Symptoms: Positive for: Wounds - left lower leg Medical History: Negative for: History of Burn Musculoskeletal Complaints and Symptoms: Negative for: Muscle Pain; Muscle Weakness Neurologic Complaints and  Symptoms: Negative for: Numbness/parasthesias Medical History: Positive for: Neuropathy Psychiatric Complaints and Symptoms: Negative for: Claustrophobia; Suicidal Medical History: Negative for: Anorexia/bulimia; Confinement Anxiety Hematologic/Lymphatic Endocrine Medical History: Positive for: Type II Diabetes Negative for: Type I Diabetes Time with diabetes: 8 years Treated with: Oral agents Blood sugar tested every day: No Immunological Oncologic Medical History: Positive for: Received Chemotherapy Negative for: Received Radiation Past Medical History Notes: 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 Immunizations Pneumococcal Vaccine: Received Pneumococcal Vaccination: Yes Implantable Devices None Family and Social History Cancer: Yes - Father,Maternal Grandparents,Child; Diabetes: Yes - Father,Paternal Grandparents; Heart Disease: Yes - Paternal Grandparents,Mother; Hereditary Spherocytosis: No; Hypertension: Yes - Mother; Kidney Disease: No; Lung Disease: Yes - Maternal Grandparents; Seizures: No; Stroke: Yes - Maternal Grandparents; Thyroid Problems: No; Tuberculosis: No; Former smoker - quit in 1992; Marital Status - Married; Alcohol Use: Rarely; Drug Use: No History; Caffeine Use: Moderate; Financial Concerns: No; Food, Clothing or Shelter Needs: No; Support System Lacking: No; Transportation Concerns: No Engineer, maintenance) Signed: 12/31/2019 5:21:02 PM By: Baruch Gouty RN, BSN Signed: 12/31/2019 5:29:05 PM By: Linton Ham MD Entered By: Baruch Gouty on 12/31/2019 11:15:25 -------------------------------------------------------------------------------- SuperBill Details Patient Name: Date of Service: [name] 12/31/2019 Medical Record [id number] Patient Account Number: [id number] Date of Birth/Sex: Treating RN: [birth year] (73 y.o. Oval Linsey Primary Care Provider: [name] Other Clinician: Referring Provider: Treating  Provider/Extender:Jarita Raval, [name], [name] in Treatment: 0 Diagnosis Coding ICD-10 Codes Code Description [phone] Chronic venous hypertension (idiopathic) with ulcer and inflammation of left lower extremity I89.0 Lymphedema, not elsewhere classified L97.821 Non-pressure chronic ulcer of other part of left lower leg limited to breakdown of skin Facility Procedures CPT4 Code:  [id number] Description: 99213 - WOUND CARE VISIT-LEV 3 EST PT Modifier: 25 Quantity: 1 Physician Procedures CPT4 Code Description: [id number] - WC PHYS LEVEL 4 - EST PT ICD-10 Diagnosis Description I87.332 Chronic venous hypertension (idiopathic) with ulcer and in extremity I89.0 Lymphedema, not elsewhere classified L97.821 Non-pressure chronic ulcer of  other part of left lower leg skin Modifier: flammation of lef limited to break Quantity: 1 t lower down of Electronic Signature(s) Signed: 12/31/2019 5:29:05 PM By: Linton Ham MD Signed: 01/01/2020 8:57:53 AM By: Carlene Coria RN Entered By: Carlene Coria on 12/31/2019 14:31:37

## 2020-01-06 NOTE — Progress Notes (Signed)
GJON, LETARTE ([id number]) Visit Report for 12/31/2019 Allergy List Details Patient Name: Date of Service: EMRYS, MCKAMIE 12/31/2019 10:30 AM Medical Record [id number] Patient Account Number: [id number] Date of Birth/Sex: Treating RN: 1946-05-15 (74 y.o. Ernestene Mention Primary Care Dyna Figuereo: [name] Other Clinician: Referring Avilene Marrin: Treating Esai Stecklein/Extender:Robson, [name], [name] in Treatment: 0 Allergies Active Allergies No Known Drug Allergies Allergy Notes Electronic Signature(s) Signed: 12/31/2019 5:21:02 PM By: Baruch Gouty RN, BSN Entered By: Baruch Gouty on 12/31/2019 11:12:17 -------------------------------------------------------------------------------- Arrival Information Details Patient Name: Date of Service: [name] 12/31/2019 10:30 AM Medical Record [id number] Patient Account Number: [id number] Date of Birth/Sex: Treating RN: 1946/09/15 (74 y.o. Ernestene Mention Primary Care Alan Drummer: [name] Other Clinician: Referring Maurita Havener: Treating Wendolyn Raso/Extender:Robson, [name], [name] in Treatment: 0 Visit Information Patient Arrived: [name] Arrival Time: 11:05 Accompanied By: spouse Transfer Assistance: None Patient Identification Verified: Yes Secondary Verification Process Completed: Yes Patient Requires Transmission-Based No Precautions: Patient Has Alerts: No History Since Last Visit Has Compression in Place as Prescribed: Yes Electronic Signature(s) Signed: 12/31/2019 5:21:02 PM By: Baruch Gouty RN, BSN Entered By: Baruch Gouty on 12/31/2019 11:11:02 -------------------------------------------------------------------------------- Clinic Level of Care Assessment Details Patient Name: Date of Service: KEELON, ZURN 12/31/2019 10:30 AM Medical Record [id number] Patient Account Number: [id number] Date of Birth/Sex: Treating RN: 1946-03-27 (73 y.o. [name])  Carlene Coria Primary Care Lendora Keys: [name] Other Clinician: Referring Ennis Heavner: Treating Britainy Kozub/Extender:Robson, [name], [name] in Treatment: 0 Clinic Level of Care Assessment Items TOOL 1 Quantity Score X - Use when EandM and Procedure is performed on INITIAL visit 1 0 ASSESSMENTS - Nursing Assessment / Reassessment X - General Physical Exam (combine w/ comprehensive assessment (listed just below) 1 20 when performed on new pt. evals) X - Comprehensive Assessment (HX, ROS, Risk Assessments, Wounds Hx, etc.) 1 25 ASSESSMENTS - Wound and Skin Assessment / Reassessment []  - Dermatologic / Skin Assessment (not related to wound area) 0 ASSESSMENTS - Ostomy and/or Continence Assessment and Care []  - Incontinence Assessment and Management 0 []  - Ostomy Care Assessment and Management (repouching, etc.) 0 PROCESS - Coordination of Care X - Simple Patient / Family Education for ongoing care 1 15 []  - Complex (extensive) Patient / Family Education for ongoing care 0 X - Staff obtains Consents, Records, Test Results / Process Orders 1 10 []  - Staff telephones HHA, Nursing Homes / Clarify orders / etc 0 []  - Routine Transfer to another Facility (non-emergent condition) 0 []  - Routine Hospital Admission (non-emergent condition) 0 X - New Admissions / Biomedical engineer / Ordering NPWT, Apligraf, etc. 1 15 []  - Emergency Hospital Admission (emergent condition) 0 PROCESS - Special Needs []  - Pediatric / Minor Patient Management 0 []  - Isolation Patient Management 0 []  - Hearing / Language / Visual special needs 0 []  - Assessment of Community assistance (transportation, D/C planning, etc.) 0 []  - Additional assistance / Altered mentation 0 []  - Support Surface(s) Assessment (bed, cushion, seat, etc.) 0 INTERVENTIONS - Miscellaneous []  - External ear exam 0 []  - Patient Transfer (multiple staff / Civil Service fast streamer / Similar devices) 0 []  - Simple Staple / Suture removal  (25 or less) 0 []  - Complex Staple / Suture removal (26 or more) 0 []  - Hypo/Hyperglycemic Management (do not check if billed separately) 0 X - Ankle / Brachial Index (ABI) - do not check if billed separately 1 15 Has the patient been seen at the hospital within the  last three years: Yes Total Score: 100 Level Of Care: New/Established - Level 3 Electronic Signature(s) Signed: 01/01/2020 8:57:53 AM By: Carlene Coria RN Entered By: Carlene Coria on 12/31/2019 12:08:19 -------------------------------------------------------------------------------- Compression Therapy Details Patient Name: Date of Service: DAMARRION, MIMBS 12/31/2019 10:30 AM Medical Record [id number] Patient Account Number: [id number] Date of Birth/Sex: Treating RN: 12-22-1945 (73 y.o. Oval Linsey Primary Care Carston Riedl: [name] Other Clinician: Referring Brennon Otterness: Treating Keiston Manley/Extender:Robson, [name], [name] in Treatment: 0 Compression Therapy Performed for Wound Wound #11 Left,Distal,Anterior Lower Leg Assessment: Performed By: Jake Church, RN Compression Type: Three Layer Post Procedure Diagnosis Same as Pre-procedure Electronic Signature(s) Signed: 01/01/2020 8:57:53 AM By: Carlene Coria RN Entered By: Carlene Coria on 12/31/2019 12:08:42 -------------------------------------------------------------------------------- Compression Therapy Details Patient Name: Date of Service: [name] 12/31/2019 10:30 AM Medical Record [id number] Patient Account Number: [id number] Date of Birth/Sex: Treating RN: 06-27-1946 (73 y.o. Oval Linsey Primary Care Dawayne Ohair: [name] Other Clinician: Referring Garrette Caine: Treating Karnisha Lefebre/Extender:Robson, [name], [name] in Treatment: 0 Compression Therapy Performed for Wound Wound #12 Left,Anterior Lower Leg Assessment: Performed By: Jake Church, RN Compression Type: Three Layer Post  Procedure Diagnosis Same as Pre-procedure Electronic Signature(s) Signed: 01/01/2020 8:57:53 AM By: Carlene Coria RN Entered By: Carlene Coria on 12/31/2019 12:08:43 -------------------------------------------------------------------------------- Encounter Discharge Information Details Patient Name: Date of Service: [name] 12/31/2019 10:30 AM Medical Record [id number] Patient Account Number: [id number] Date of Birth/Sex: Treating RN: [birth year] (74 y.o. Janyth Contes Primary Care Mahkai Fangman: [name] Other Clinician: Referring Jarren Para: Treating Dailan Pfalzgraf/Extender:Robson, [name], [name] in Treatment: 0 Encounter Discharge Information Items Discharge Condition: Stable Ambulatory Status: Walker Discharge Destination: Home Transportation: Private Auto Accompanied By: wife Schedule Follow-up Appointment: Yes Clinical Summary of Care: Patient Declined Electronic Signature(s) Signed: 01/06/2020 5:18:21 PM By: Levan Hurst RN, BSN Entered By: Levan Hurst on 12/31/2019 13:06:42 -------------------------------------------------------------------------------- Lower Extremity Assessment Details Patient Name: Date of Service: [name] 12/31/2019 10:30 AM Medical Record [id number] Patient Account Number: [id number] Date of Birth/Sex: Treating RN: [birth year] (74 y.o. Ernestene Mention Primary Care Dearra Myhand: [name] Other Clinician: Referring Lawsen Arnott: Treating Merrisa Skorupski/Extender:Robson, [name], [name] in Treatment: 0 Edema Assessment Assessed: [Left: No] [Right: No] Edema: [Left: Ye] [Right: s] Calf Left: Right: Point of Measurement: cm From Medial Instep 41 cm cm Ankle Left: Right: Point of Measurement: cm From Medial Instep 31.3 cm cm Vascular Assessment Pulses: Dorsalis Pedis Palpable: [Left:Yes] Blood Pressure: Brachial: [Left:132] Dorsalis Pedis: 188 Ankle: [Left:Posterior Tibial: 172  1.42] Electronic Signature(s) Signed: 12/31/2019 5:21:02 PM By: Baruch Gouty RN, BSN Entered By: Baruch Gouty on 12/31/2019 11:25:19 -------------------------------------------------------------------------------- Multi Wound Chart Details Patient Name: Date of Service: [name] 12/31/2019 10:30 AM Medical Record [id number] Patient Account Number: [id number] Date of Birth/Sex: Treating RN: August 15, 1946 (73 y.o. 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] [12:Venous Leg Ulcer] [N/A:N/A] 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] [12:Lower Extremity] Comorbid History: [11:Sleep Apnea, Congestive Sleep Apnea, Congestive N/A Heart Failure, Hypertension, Heart Failure, Hypertension, Type II Diabetes,  Neuropathy, Received Chemotherapy] [12:Type II Diabetes, Neuropathy, Received Chemotherapy] Date Acquired: [11:12/17/2019] [12:12/17/2019] [N/A:N/A] Weeks of Treatment: [11:0] [12:0] [N/A:N/A] Wound Status: [11:Open] [12:Open] [N/A:N/A] Measurements L x W x D 1.4x1.6x0.1 [12:1.1x0.4x0.1] [N/A:N/A] (cm) Area (cm) : [11:1.759] [12:0.346] [N/A:N/A] Volume (cm) : [11:0.176] [12:0.035] [N/A:N/A] Classification: [11:Full Thickness Without Exposed Support Structures Exposed Support Structures] [12:Full Thickness Without] [N/A:N/A] Exudate Amount: [11:Medium] [12:Small]  [N/A:N/A] Exudate Type: [11:Serous] [12:Serous] [N/A:N/A] Exudate Color: [11:amber] [12:amber] [N/A:N/A] Wound Margin: [11:Flat and Intact] [12:Flat and Intact] [N/A:N/A] Granulation Amount: [11:Large (67-100%)] [12:Large (67-100%)] [N/A:N/A] Granulation Quality: [11:Red] [12:Red] [N/A:N/A] Necrotic Amount: [11:None Present (0%)] [12:None Present (0%)] [N/A:N/A] Exposed Structures: [11:Fat Layer (Subcutaneous Fat Layer (Subcutaneous N/A Tissue) Exposed: Yes Fascia: No Tendon: No Muscle: No Joint: No Bone: No] [12:Tissue) Exposed: Yes Fascia: No Tendon: No Muscle: No Joint: No Bone: No] Epithelialization: [11:Medium (34-66%)] [12:Medium (34-66%) Compression Therapy] [N/A:N/A N/A] Treatment Notes Electronic Signature(s) Signed: 12/31/2019 5:29:05 PM By: Linton Ham MD Signed: 01/01/2020 8:57:53 AM By: Carlene Coria RN Entered By: Linton Ham on 12/31/2019 12:36:39 -------------------------------------------------------------------------------- Multi-Disciplinary Care Plan Details Patient Name: Date of Service: [name] 12/31/2019 10:30 AM Medical Record [id number] Patient Account Number: [id number] Date of Birth/Sex: Treating RN: August 28, 1946 (73 y.o. Staci Acosta, Morey Hummingbird Primary Care Masae Lukacs: [name] Other Clinician: Referring Seth Higginbotham: Treating Maryella Abood/Extender:Robson, [name], [name] in Treatment: 0 Active Inactive Abuse / Safety / Falls / Self Care Management Nursing Diagnoses: Impaired physical mobility Goals: Patient will not develop complications from immobility Date Initiated: 12/31/2019 Target Resolution Date: 01/31/2020 Goal Status: Active Interventions: Assess Activities of Daily Living upon admission and as needed Assess fall risk on admission and as needed Assess: immobility, friction, shearing, incontinence upon admission and as needed Assess impairment of mobility on admission and as needed per policy Assess personal safety and  home safety (as indicated) on admission and as needed Assess self care needs on admission and as needed Notes: Nutrition Nursing Diagnoses: Potential for alteratiion in Nutrition/Potential for imbalanced nutrition Goals: Patient/caregiver verbalizes understanding of need to maintain therapeutic glucose control per primary care physician Date Initiated: 12/31/2019 Target Resolution Date: 01/31/2020 Goal Status: Active Interventions: Assess patient nutrition upon admission and as needed per policy Notes: Wound/Skin Impairment Nursing Diagnoses: Knowledge deficit related to ulceration/compromised skin integrity Goals: Patient/caregiver will verbalize understanding of skin care regimen Date Initiated: 12/31/2019 Target Resolution Date: 01/31/2020 Goal Status: Active Ulcer/skin breakdown will have a volume reduction of 30% by week 4 Date Initiated: 12/31/2019 Target Resolution Date: 01/31/2020 Goal Status: Active Interventions: Assess patient/caregiver ability to obtain necessary supplies Assess patient/caregiver ability to perform ulcer/skin care regimen upon admission and as needed Assess ulceration(s) every visit Notes: Electronic Signature(s) Signed: 01/01/2020 8:57:53 AM By: Carlene Coria RN Entered By: Carlene Coria on 12/31/2019 12:07:11 -------------------------------------------------------------------------------- Pain Assessment Details Patient Name: Date of Service: POOKELA, SELLIN 12/31/2019 10:30 AM Medical Record [id number] Patient Account Number: [id number] Date of Birth/Sex: Treating RN: 1946-01-18 (74 y.o. Ernestene Mention Primary Care Masiah Woody: [name] Other Clinician: Referring Alberto Schoch: Treating Azaylia Fong/Extender:Robson, [name], [name] in Treatment: 0 Active Problems Location of Pain Severity and Description of Pain Patient Has Paino No Site Locations With Dressing Change: Yes Duration of the Pain. Constant / Intermittento  Intermittent Rate the pain. Current Pain Level: 0 Character of Pain Describe the Pain: Tender Pain Management and Medication Current Pain Management: Electronic Signature(s) Signed: 12/31/2019 5:21:02 PM By: Baruch Gouty RN, BSN Entered By: Baruch Gouty on 12/31/2019 11:31:06 -------------------------------------------------------------------------------- Patient/Caregiver Education Details Patient Name:  Date of Service: JAELIN, FACKLER 3/16/2021andnbsp10:30 AM Medical Record [id number] Patient Account Number: [id number] Date of Birth/Gender: Treating RN: 20-Mar-1946 (73 y.o. [name]) Carlene Coria Primary Care Physician: [name] Other Clinician: Referring Physician: Treating Physician/Extender:Robson, [name], [name] in Treatment: 0 Education Assessment Education Provided To: Patient Education Topics Provided Wound/Skin Impairment: Methods: Explain/Verbal Responses: State content correctly Electronic Signature(s) Signed: 01/01/2020 8:57:53 AM By: Carlene Coria RN Entered By: Carlene Coria on 12/31/2019 12:07:24 -------------------------------------------------------------------------------- Wound Assessment Details Patient Name: Date of Service: HARKIRAT, OROZCO 12/31/2019 10:30 AM Medical Record [id number] Patient Account Number: [id number] Date of Birth/Sex: Treating RN: Jan 01, 1946 (74 y.o. Ernestene Mention Primary Care Eldana Isip: [name] Other Clinician: Referring Alvar Malinoski: Treating Moss Berry/Extender:Robson, [name], [name] in Treatment: 0 Wound Status Wound Number: 11 Primary Venous Leg Ulcer Etiology: Wound Location: Left Lower Leg - Anterior, Distal Secondary Diabetic Wound/Ulcer of the Lower Extremity Wounding Event: Gradually Appeared Etiology: Date Acquired: 12/17/2019 Wound Open Weeks Of Treatment: 0 Status: Clustered Wound: No Comorbid Sleep Apnea, Congestive Heart Failure, History: Hypertension,  Type II Diabetes, Neuropathy, Received Chemotherapy Photos Photo Uploaded By: Mikeal Hawthorne on 01/01/2020 13:51:02 Wound Measurements Length: (cm) 1.4 % Reduct Width: (cm) 1.6 % Reduct Depth: (cm) 0.1 Epitheli Area: (cm) 1.759 Tunneli Volume: (cm) 0.176 Undermi Wound Description Full Thickness Without Exposed Support Foul Odo Classification: Structures Slough/F Wound Flat and Intact Margin: Exudate Medium Amount: Exudate Serous Type: Exudate amber Color: Wound Bed Granulation Amount: Large (67-100%) Granulation Quality: Red Fascia E Necrotic Amount: None Present (0%) Fat Laye Tendon E Muscle E Joint Ex Bone Exp r After Cleansing: No ibrino No Exposed Structure xposed: No r (Subcutaneous Tissue) Exposed: Yes xposed: No xposed: No posed: No osed: No ion in Area: ion in Volume: alization: Medium (34-66%) ng: No ning: No Treatment Notes Wound #11 (Left, Distal, Anterior Lower Leg) 1. Cleanse With Wound Cleanser 2. Periwound Care TCA Ointment 3. Primary Dressing Applied Calcium Alginate Ag 4. Secondary Dressing ABD Pad Dry Gauze 6. Support Layer Applied 3 layer compression Water quality scientist) Signed: 12/31/2019 5:21:02 PM By: Baruch Gouty RN, BSN Entered By: Baruch Gouty on 12/31/2019 11:29:00 -------------------------------------------------------------------------------- Wound Assessment Details Patient Name: Date of Service: [name] 12/31/2019 10:30 AM Medical Record [id number] Patient Account Number: [id number] Date of Birth/Sex: Treating RN: 03-15-1946 (74 y.o. Ernestene Mention Primary Care Jamicia Haaland: [name] Other Clinician: Referring Neiman Roots: Treating Labrenda Lasky/Extender:Robson, [name], [name] in Treatment: 0 Wound Status Wound Number: 12 Primary Venous Leg Ulcer Etiology: Wound Location: Left Lower Leg - Anterior Secondary Diabetic Wound/Ulcer of the Lower Extremity Wounding Event:  Gradually Appeared Etiology: Date Acquired: 12/17/2019 Wound Open Weeks Of Treatment: 0 Status: Clustered Wound: No Comorbid Sleep Apnea, Congestive Heart Failure, History: Hypertension, Type II Diabetes, Neuropathy, Received Chemotherapy Photos Photo Uploaded By: Mikeal Hawthorne on 01/01/2020 13:51:03 Wound Measurements Length: (cm) 1.1 % Reduct Width: (cm) 0.4 % Reduct Depth: (cm) 0.1 Epitheli Area: (cm) 0.346 Tunneli Volume: (cm) 0.035 Undermi Wound Description Full Thickness Without Exposed Support Foul Od Classification: Structures Slough/ Wound Flat and Intact Margin: Exudate Small Amount: Exudate Serous Type: Exudate amber Color: Wound Bed Granulation Amount: Large (67-100%) Granulation Quality: Red Fascia Necrotic Amount: None Present (0%) Fat Layer Tendon Exp Muscle Exp Joint Expo Bone Expos or After Cleansing: No Fibrino No Exposed Structure Exposed: No (Subcutaneous Tissue) Exposed: Yes osed: No osed: No sed: No ed: No ion in Area: ion in Volume: alization: Medium (34-66%) ng: No ning: No  Treatment Notes Wound #12 (Left, Anterior Lower Leg) 1. Cleanse With Wound Cleanser 2. Periwound Care TCA Ointment 3. Primary Dressing Applied Calcium Alginate Ag 4. Secondary Dressing ABD Pad Dry Gauze 6. Support Layer Applied 3 layer compression Water quality scientist) Signed: 12/31/2019 5:21:02 PM By: Baruch Gouty RN, BSN Entered By: Baruch Gouty on 12/31/2019 11:30:06 -------------------------------------------------------------------------------- Lee Details Patient Name: Date of Service: [name] 12/31/2019 10:30 AM Medical Record [id number] Patient Account Number: [id number] Date of Birth/Sex: Treating RN: [birth year] (74 y.o. Ernestene Mention Primary Care Mellie Buccellato: [name] Other Clinician: Referring Rechel Delosreyes: Treating Grenda Lora/Extender:Robson, [name], [name] in Treatment: 0 Vital  Signs Time Taken: 11:11 Temperature (F): 98.3 Height (in): 76 Pulse (bpm): 52 Source: Stated Respiratory Rate (breaths/min): 20 Weight (lbs): 323 Blood Pressure (mmHg): 132/67 Source: Stated Reference Range: 80 - 120 mg / dl Body Mass Index (BMI): 39.3 Electronic Signature(s) Signed: 12/31/2019 5:21:02 PM By: Baruch Gouty RN, BSN Entered By: Baruch Gouty on 12/31/2019 11:11:57

## 2020-01-09 ENCOUNTER — Encounter (HOSPITAL_BASED_OUTPATIENT_CLINIC_OR_DEPARTMENT_OTHER): Payer: Medicare Other | Admitting: Internal Medicine

## 2020-01-09 ENCOUNTER — Other Ambulatory Visit: Payer: Self-pay

## 2020-01-09 DIAGNOSIS — I890 Lymphedema, not elsewhere classified: Secondary | ICD-10-CM | POA: Diagnosis not present

## 2020-01-09 DIAGNOSIS — I5042 Chronic combined systolic (congestive) and diastolic (congestive) heart failure: Secondary | ICD-10-CM | POA: Diagnosis not present

## 2020-01-09 DIAGNOSIS — E11621 Type 2 diabetes mellitus with foot ulcer: Secondary | ICD-10-CM | POA: Diagnosis not present

## 2020-01-09 DIAGNOSIS — I480 Paroxysmal atrial fibrillation: Secondary | ICD-10-CM | POA: Diagnosis not present

## 2020-01-09 DIAGNOSIS — L97821 Non-pressure chronic ulcer of other part of left lower leg limited to breakdown of skin: Secondary | ICD-10-CM | POA: Diagnosis not present

## 2020-01-09 DIAGNOSIS — I87332 Chronic venous hypertension (idiopathic) with ulcer and inflammation of left lower extremity: Secondary | ICD-10-CM | POA: Diagnosis not present

## 2020-01-09 NOTE — Progress Notes (Signed)
[name], [name] ([id number]) Visit Report for 01/09/2020 HPI Details Patient Name: Date of Service: [name], [name] 01/09/2020 12:30 PM Medical Record [id number] Patient Account Number: [id number] Date of Birth/Sex: Treating RN: July 16, 1946 (74 y.o. [name] Primary Care Provider: [name] Other Clinician: Referring Provider: Treating Provider/Extender:Helayna Dun, [name], [name] in Treatment: 1 History of Present Illness HPI Description: ADMISSION 07/18/2019 Patient is a 74 year old man with type 2 diabetes and also chronic edema. He had a fall in July. Fairly substantial wound on the right medial knee and across the top of the patella. This is actually made some progress since then. He was seen in the ED on 8/18 with cellulitis. He was treated with doxycycline. This got extended I think for as long as 30 days. On 9/15 he was seen by his primary felt to have cellulitis with open wounds. He currently has 7 wounds the remnant of the initial contusion injury on the right medial knee which actually I think is actually quite a bit smaller than it was looking at a picture on his wife's cell phone. He also has 6 is wounds on the right lower extremity which includes 2 on the right anterior 3 on the right lateral and one posteriorly. He has compression stockings but has not been wearing them. He says these latter 6 wounds started as blisters that open. He has been using Neosporin. 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 He also has a laceration wound on the  lower medial left thigh just above the knee. We have been using Santyl on the latter, silver alginate on the wounds on the right. The patient does not have an open wound on the left and he has a lot less edema. On the left side. We have given him the number for elastic therapy in [place] for 20/30 stockings on the left leg. I am not certain that that will suffice on the right however 10/13; we received an urgent call from home health this morning reporting that the patient's right leg had a dusky color. We brought him into the clinic for review. He has severe bilateral right greater than left chronic venous insufficiency with secondary lymphedema and has discoloration on the right leg chronically related to this. He also reports he wrapped his upper leg with an Ace wrap and may have done this with excessive tension 10/22. The patient still has a clean wound on the upper anterior thigh the remanent of a very large traumatic wound. Small wound in the right posterior calf in the setting of severe chronic venous hypertension. There is nothing open on the left leg and he has stockings 11/12; I think since the last time the patient was here the area on the traumatic wound just above his knee scabbed over and was felt to be healed. During the course of wound preparation today our intake nurse washed the scab off there is still an open wound here. Also still a small area on the posterior calf on the right in the setting of severe chronic venous hypertension. He has not been dressing the right thigh wound. Using silver alginate on the other wounds 12/3;  3-week follow-up he has. The area just above his knee anteriorly is improved however he has new areas on the right lateral knee and an area on the right lateral calf. I am not sure how this happened or when. The patient was not even sure when this happened. 12/17; the large area above his knee anteriorly and medially is completely closed today. The new  areas from last time included the right lateral knee and the right lateral calf. The right lateral calf is healed. He still has small superficial areas on the right medial knee 10/25/2019. We discharged the patient last time he was here. His wife states that within 4 days the swelling got to the point where she could not get the 20/30 compression stocking on the right leg. He subsequently opened up to a small open area medially. He is here for our review of that. He has his 20/30 below-knee stocking on the left which seems to be doing a decent job controlling the edema here. He has a large amount of edema in the right leg and we are fortunate that he only has a superficial medial wound. The patient saw his primary physician Dr. [name] on 12/17. He was felt to have cellulitis and put on Levaquin and doxy. Any degree of cellulitis he had then is now resolved 1/15; the patient is just about healed. Small open area remaining on the right leg we will put him back in compression. There is nothing on the left leg he has a loose fitting 20/30 stocking for the left leg. I think he is going to need better than this bilaterally if not currently then soon we will go ahead and order him bilateral external compression garments 1/22; the patient has no open wound on the right leg. He comes in with a juxta lite stocking for the right leg. I think he has a 20/30 below-knee on the left leg. READMISSION 12/31/2019 This is a patient we know fairly well. He has bilateral severe chronic venous insufficiency with secondary lymphedema. The time he was in the clinic he had wounds on the right leg not the left. The end he left with a juxta lite stocking for the right leg but a 20/30 below-knee stocking on the left. Over the last 2 to 3 weeks he started noting noticing left leg blisters and he has 2 anterior wounds on the left anterior lower extremity. These are superficial he has been using Neosporin with some type of  covering. His past medical history is unchanged. He has type 2 diabetes on oral agents, chronic venous insufficiency, lymphedema, hypertension, obstructive sleep apnea, remote history of non-Hodgkin's lymphoma, history of CHF both diastolic and systolic and paroxysmal A. fib ABI in our clinic 0.42 on the left 3/25; the patient came back to clinic last week with 2 small areas on the left anterior lower leg. We put him back in compression and these are healed. For some reason we did not order a left lower extremity juxta lite last week and he will certainly need that. Electronic Signature(s) Signed: 01/09/2020 6:03:33 PM By: [name] Ham MD Entered By: [name] on 01/09/2020 13:46:33 -------------------------------------------------------------------------------- Physical Exam Details Patient Name: Date of Service: [name] 01/09/2020 12:30 PM Medical Record [id number] Patient Account Number: [id number] Date of Birth/Sex: Treating RN: 08-Oct-1946 (74 y.o. [name] Primary Care Provider: [name] Other Clinician: Referring Provider: Treating Provider/Extender:Phala Schraeder, [name], [name] in Treatment: 1 Cardiovascular Pedal pulses are palpable. Musculoskeletal There is  no infection however there is severe chronic venous insufficiency in the left lower leg with hemosiderin deposition. Notes Wound exam; the patient has healed his lower extremity wounds with compression. He has severe bilateral chronic venous insufficiency with secondary lymphedema. The edema control is excellent. Electronic Signature(s) Signed: 01/09/2020 6:03:33 PM By: [name] Ham MD Entered By: [name] on 01/09/2020 13:48:20 -------------------------------------------------------------------------------- Physician Orders Details Patient Name: Date of Service: [name] 01/09/2020 12:30 PM Medical Record [id number] Patient Account Number:  [id number] Date of Birth/Sex: Treating RN: [birth year] (74 y.o. [name], [name] Primary Care Provider: [name] Other Clinician: Referring Provider: Treating Provider/Extender:Lisel Siegrist, [name], [name] in Treatment: 1 Verbal / Phone Orders: No Diagnosis Coding ICD-10 Coding Code Description [phone] Chronic venous hypertension (idiopathic) with ulcer and inflammation of left lower extremity I89.0 Lymphedema, not elsewhere classified L97.821 Non-pressure chronic ulcer of other part of left lower leg limited to breakdown of skin Discharge From [facility] Discharge from [place] - call if any future wound care needs. Skin Barriers/Peri-Wound Care Moisturizing lotion - daily at night. Edema Control Avoid standing for long periods of time Elevate legs to the level of the heart or above for 30 minutes daily and/or when sitting, a frequency of: - throughout the day. Exercise regularly Support Garment 30-40 mm/Hg pressure to: - patient to wear juxtalites HD to both legs daily. Apply in the morning and remove at night. wound care center to order juxtalite HD for left leg. Electronic Signature(s) Signed: 01/09/2020 6:03:33 PM By: [name] Ham MD Signed: 01/09/2020 6:22:19 PM By: Deon Pilling Entered By: Deon Pilling on 01/09/2020 13:32:29 -------------------------------------------------------------------------------- Problem List Details Patient Name: Date of Service: RYDELL, WIEGEL 01/09/2020 12:30 PM Medical Record [id number] Patient Account Number: [id number] Date of Birth/Sex: Treating RN: Aug 20, 1946 (74 y.o. [name], [name] Primary Care Provider: [name] Other Clinician: Referring Provider: Treating Provider/Extender:Tadan Shill, [name], [name] in Treatment: 1 Active Problems ICD-10 Evaluated Encounter Code Description Active Date Today Diagnosis I87.332 Chronic venous hypertension (idiopathic) with ulcer 12/31/2019 No  Yes and inflammation of left lower extremity I89.0 Lymphedema, not elsewhere classified 12/31/2019 No Yes L97.821 Non-pressure chronic ulcer of other part of left lower 12/31/2019 No Yes leg limited to breakdown of skin Inactive Problems Resolved Problems Electronic Signature(s) Signed: 01/09/2020 6:03:33 PM By: [name] Ham MD Entered By: [name] on 01/09/2020 13:45:43 -------------------------------------------------------------------------------- Progress Note Details Patient Name: Date of Service: [name] 01/09/2020 12:30 PM Medical Record [id number] Patient Account Number: [id number] Date of Birth/Sex: Treating RN: 1946/08/30 (74 y.o. [name] Primary Care Provider: [name] Other Clinician: Referring Provider: Treating Provider/Extender:Jasman Murri, [name], [name] in Treatment: 1 Subjective History of Present Illness (HPI) ADMISSION 07/18/2019 Patient is a 74 year old man with type 2 diabetes and also chronic edema. He had a fall in July. Fairly substantial wound on the right medial knee and across the top of the patella. This is actually made some progress since then. He was seen in the ED on 8/18 with cellulitis. He was treated with doxycycline. This got extended I think for as long as 30 days. On 9/15 he was seen by his primary felt to have cellulitis with open wounds. He currently has 7 wounds the remnant of the initial contusion injury on the right medial knee which actually I think is actually quite a bit smaller than it was looking at a picture on his wife's cell phone. He also has 6 is wounds on the  right lower extremity which includes 2 on the right anterior 3 on the right lateral and one posteriorly. He has compression stockings but has not been wearing them. He says these latter 6 wounds started as blisters that open. He has been using Neosporin. Past medical history; type 2 diabetes apparently well controlled, chronic  atrial fibrillation, edema, systolic and diastolic congestive heart failure, cardiomyopathy, sleep apnea history of non-Hodgkin's lymphoma in his chest but he was treated with chemotherapy and did not use or receive radiation ABI in our clinic was 1.14 on the right 10/8; patient with bilateral right greater than left chronic venous insufficiency with secondary lymphedema. He also has a laceration wound on the lower medial left thigh just above the knee. We have been using Santyl on the latter, silver alginate on the wounds on the right. The patient does not have an open wound on the left and he has a lot less edema. On the left side. We have given him the number for elastic therapy in [place] for 20/30 stockings on the left leg. I am not certain that that will suffice on the right however 10/13; we received an urgent call from home health this morning reporting that the patient's right leg had a dusky color. We brought him into the clinic for review. He has severe bilateral right greater than left chronic venous insufficiency with secondary lymphedema and has discoloration on the right leg chronically related to this. He also reports he wrapped his upper leg with an Ace wrap and may have done this with excessive tension 10/22. The patient still has a clean wound on the upper anterior thigh the remanent of a very large traumatic wound. Small wound in the right posterior calf in the setting of severe chronic venous hypertension. There is nothing open on the left leg and he has stockings 11/12; I think since the last time the patient was here the area on the traumatic wound just above his knee scabbed over and was felt to be healed. During the course of wound preparation today our intake nurse washed the scab off there is still an open wound here. Also still a small area on the posterior calf on the right in the setting of severe chronic venous hypertension. He has not been dressing the right thigh  wound. Using silver alginate on the other wounds 12/3; 3-week follow-up he has. The area just above his knee anteriorly is improved however he has new areas on the right lateral knee and an area on the right lateral calf. I am not sure how this happened or when. The patient was not even sure when this happened. 12/17; the large area above his knee anteriorly and medially is completely closed today. The new areas from last time included the right lateral knee and the right lateral calf. The right lateral calf is healed. He still has small superficial areas on the right medial knee 10/25/2019. We discharged the patient last time he was here. His wife states that within 4 days the swelling got to the point where she could not get the 20/30 compression stocking on the right leg. He subsequently opened up to a small open area medially. He is here for our review of that. He has his 20/30 below-knee stocking on the left which seems to be doing a decent job controlling the edema here. He has a large amount of edema in the right leg and we are fortunate that he only has a superficial medial wound. The patient  saw his primary physician Dr. [name] on 12/17. He was felt to have cellulitis and put on Levaquin and doxy. Any degree of cellulitis he had then is now resolved 1/15; the patient is just about healed. Small open area remaining on the right leg we will put him back in compression. There is nothing on the left leg he has a loose fitting 20/30 stocking for the left leg. I think he is going to need better than this bilaterally if not currently then soon we will go ahead and order him bilateral external compression garments 1/22; the patient has no open wound on the right leg. He comes in with a juxta lite stocking for the right leg. I think he has a 20/30 below-knee on the left leg. READMISSION 12/31/2019 This is a patient we know fairly well. He has bilateral severe chronic venous insufficiency with  secondary lymphedema. The time he was in the clinic he had wounds on the right leg not the left. The end he left with a juxta lite stocking for the right leg but a 20/30 below-knee stocking on the left. Over the last 2 to 3 weeks he started noting noticing left leg blisters and he has 2 anterior wounds on the left anterior lower extremity. These are superficial he has been using Neosporin with some type of covering. His past medical history is unchanged. He has type 2 diabetes on oral agents, chronic venous insufficiency, lymphedema, hypertension, obstructive sleep apnea, remote history of non-Hodgkin's lymphoma, history of CHF both diastolic and systolic and paroxysmal A. fib ABI in our clinic 0.42 on the left 3/25; the patient came back to clinic last week with 2 small areas on the left anterior lower leg. We put him back in compression and these are healed. For some reason we did not order a left lower extremity juxta lite last week and he will certainly need that. Objective Constitutional Vitals Time Taken: 12:45 PM, Height: 76 in, Weight: 323 lbs, BMI: 39.3, Temperature: 98.1 F, Pulse: 56 bpm, Respiratory Rate: 19 breaths/min, Blood Pressure: 103/52 mmHg. Cardiovascular Pedal pulses are palpable. Musculoskeletal There is no infection however there is severe chronic venous insufficiency in the left lower leg with hemosiderin deposition. General Notes: Wound exam; the patient has healed his lower extremity wounds with compression. He has severe bilateral chronic venous insufficiency with secondary lymphedema. The edema control is excellent. Integumentary (Hair, Skin) Wound #11 status is Healed - Epithelialized. Original cause of wound was Gradually Appeared. The wound is located on the [facility] Lower Leg. The wound measures 0cm length x 0cm width x 0cm depth; 0cm^2 area and 0cm^3 volume. There is no tunneling or undermining noted. There is a none present amount of  drainage noted. The wound margin is flat and intact. There is no granulation within the wound bed. There is no necrotic tissue within the wound bed. Wound #12 status is Healed - Epithelialized. Original cause of wound was Gradually Appeared. The wound is located on the Left,Anterior Lower Leg. The wound measures 0cm length x 0cm width x 0cm depth; 0cm^2 area and 0cm^3 volume. There is no tunneling or undermining noted. There is a none present amount of drainage noted. The wound margin is flat and intact. There is no granulation within the wound bed. There is no necrotic tissue within the wound bed. Assessment Active Problems ICD-10 Chronic venous hypertension (idiopathic) with ulcer and inflammation of left lower extremity Lymphedema, not elsewhere classified Non-pressure chronic ulcer of other part of left lower leg  limited to breakdown of skin Plan Discharge From [facility]: Discharge from [place] - call if any future wound care needs. Skin Barriers/Peri-Wound Care: Moisturizing lotion - daily at night. Edema Control: Avoid standing for long periods of time Elevate legs to the level of the heart or above for 30 minutes daily and/or when sitting, a frequency of: - throughout the day. Exercise regularly Support Garment 30-40 mm/Hg pressure to: - patient to wear juxtalites HD to both legs daily. Apply in the morning and remove at night. wound care center to order juxtalite HD for left leg. 1. Everything is closed here 2. We ordered him a juxta lite stocking for the left leg. Until this calms hopefully in a day or 2 he will wear the original 20/30 stocking 3. I do not believe he is actually failed stocking therapy for his lower extremity edema. I did talk to them briefly about ultimately external compression pumps if we cannot seem to control this with external compression garments. 4. He seems to be compliant with the juxta lite stocking we had on the right from his last  stay on the clinic at which time all the wounds were on the right Electronic Signature(s) Signed: 01/09/2020 6:03:33 PM By: [name] Ham MD Entered By: [name] on 01/09/2020 13:49:40 -------------------------------------------------------------------------------- SuperBill Details Patient Name: Date of Service: [name] 01/09/2020 Medical Record [id number] Patient Account Number: [id number] Date of Birth/Sex: Treating RN: Apr 07, 1946 (74 y.o. [name], [name] Primary Care Provider: [name] Other Clinician: Referring Provider: Treating Provider/Extender:Culley Hedeen, [name], [name] in Treatment: 1 Diagnosis Coding ICD-10 Codes Code Description [phone] Chronic venous hypertension (idiopathic) with ulcer and inflammation of left lower extremity I89.0 Lymphedema, not elsewhere classified L97.821 Non-pressure chronic ulcer of other part of left lower leg limited to breakdown of skin Facility Procedures The patient participates with Medicare or their insurance follows the Medicare Facility Guidelines: CPT4 Code Description Modifier Quantity [id number] [place] VISIT-LEV 4 EST PT 1 Physician Procedures CPT4 Code Description: [id number] - WC PHYS LEVEL 3 - EST PT ICD-10 Diagnosis Description I89.0 Lymphedema, not elsewhere classified I87.332 Chronic venous hypertension (idiopathic) with ulcer and in extremity L97.821 Non-pressure chronic ulcer of  other part of left lower leg skin Modifier: flammation of lef limited to break Quantity: 1 t lower down of Electronic Signature(s) Signed: 01/09/2020 6:03:33 PM By: [name] Ham MD Entered By: [name] on 01/09/2020 13:49:56

## 2020-01-10 NOTE — Progress Notes (Signed)
[name] ([id number]) Visit Report for 01/09/2020 Arrival Information Details Patient Name: Date of Service: [name], [name] 01/09/2020 12:30 PM Medical Record [id number] Patient Account Number: [id number] Date of Birth/Sex: Treating RN: 1946-04-30 (74 y.o. [name] Primary Care Lesly Pontarelli: [name] Other Clinician: Referring Ramzy Cappelletti: Treating Emmalynne Courtney/Extender:Robson, [name], [name] in Treatment: 1 Visit Information History Since Last Visit Walker Added or deleted any medications: No Patient Arrived: Any new allergies or adverse reactions: No Arrival Time: 12:46 Had a fall or experienced change in No Accompanied By: wife activities of daily living that may affect Transfer Assistance: None risk of falls: Patient Identification Verified: Yes Signs or symptoms of abuse/neglect since last No Secondary Verification Process Completed: Yes visito Patient Requires Transmission-Based No Hospitalized since last visit: No Precautions: Implantable device outside of the clinic excluding No Patient Has Alerts: No cellular tissue based products placed in the center since last visit: Has Dressing in Place as Prescribed: Yes Has Compression in Place as Prescribed: Yes Pain Present Now: No Electronic Signature(s) Signed: 01/09/2020 6:20:13 PM By: Kela Millin Entered By: Kela Millin on 01/09/2020 12:46:23 -------------------------------------------------------------------------------- Clinic Level of Care Assessment Details Patient Name: Date of Service: [name] 01/09/2020 12:30 PM Medical Record [id number] Patient Account Number: [id number] Date of Birth/Sex: Treating RN: 1946/05/27 (74 y.o. [name], Tammi Klippel Primary Care Inza Mikrut: [name] Other Clinician: Referring Ayiden Milliman: Treating Allyn Bertoni/Extender:Robson, [name], [name] in Treatment: 1 Clinic Level of Care Assessment Items TOOL 4  Quantity Score X - Use when only an EandM is performed on FOLLOW-UP visit 1 0 ASSESSMENTS - Nursing Assessment / Reassessment X - Reassessment of Co-morbidities (includes updates in patient status) 1 10 X - Reassessment of Adherence to Treatment Plan 1 5 ASSESSMENTS - Wound and Skin Assessment / Reassessment []  - Simple Wound Assessment / Reassessment - one wound 0 X - Complex Wound Assessment / Reassessment - multiple wounds 2 5 X - Dermatologic / Skin Assessment (not related to wound area) 1 10 ASSESSMENTS - Focused Assessment X - Circumferential Edema Measurements - multi extremities 1 5 X - Nutritional Assessment / Counseling / Intervention 1 10 []  - Lower Extremity Assessment (monofilament, tuning fork, pulses) 0 []  - Peripheral Arterial Disease Assessment (using hand held doppler) 0 ASSESSMENTS - Ostomy and/or Continence Assessment and Care []  - Incontinence Assessment and Management 0 []  - Ostomy Care Assessment and Management (repouching, etc.) 0 PROCESS - Coordination of Care []  - Simple Patient / Family Education for ongoing care 0 X - Complex (extensive) Patient / Family Education for ongoing care 1 20 X - Staff obtains Programmer, systems, Records, Test Results / Process Orders 1 10 []  - Staff telephones HHA, Nursing Homes / Clarify orders / etc 0 []  - Routine Transfer to another Facility (non-emergent condition) 0 []  - Routine Hospital Admission (non-emergent condition) 0 []  - New Admissions / Biomedical engineer / Ordering NPWT, Apligraf, etc. 0 []  - Emergency Hospital Admission (emergent condition) 0 []  - Simple Discharge Coordination 0 X - Complex (extensive) Discharge Coordination 1 15 PROCESS - Special Needs []  - Pediatric / Minor Patient Management 0 []  - Isolation Patient Management 0 []  - Hearing / Language / Visual special needs 0 []  - Assessment of Community assistance (transportation, D/C planning, etc.) 0 []  - Additional assistance / Altered mentation 0 []  -  Support Surface(s) Assessment (bed, cushion, seat, etc.) 0 INTERVENTIONS - Wound Cleansing / Measurement []  - Simple Wound Cleansing - one wound 0 X -  Complex Wound Cleansing - multiple wounds 2 5 X - Wound Imaging (photographs - any number of wounds) 1 5 []  - Wound Tracing (instead of photographs) 0 []  - Simple Wound Measurement - one wound 0 X - Complex Wound Measurement - multiple wounds 2 5 INTERVENTIONS - Wound Dressings []  - Small Wound Dressing one or multiple wounds 0 []  - Medium Wound Dressing one or multiple wounds 0 []  - Large Wound Dressing one or multiple wounds 0 []  - Application of Medications - topical 0 []  - Application of Medications - injection 0 INTERVENTIONS - Miscellaneous []  - External ear exam 0 []  - Specimen Collection (cultures, biopsies, blood, body fluids, etc.) 0 []  - Specimen(s) / Culture(s) sent or taken to Lab for analysis 0 []  - Patient Transfer (multiple staff / Civil Service fast streamer / Similar devices) 0 []  - Simple Staple / Suture removal (25 or less) 0 []  - Complex Staple / Suture removal (26 or more) 0 []  - Hypo / Hyperglycemic Management (close monitor of Blood Glucose) 0 []  - Ankle / Brachial Index (ABI) - do not check if billed separately 0 X - Vital Signs 1 5 Has the patient been seen at the hospital within the last three years: Yes Total Score: 125 Level Of Care: New/Established - Level 4 Electronic Signature(s) Signed: 01/09/2020 6:22:19 PM By: Deon Pilling Entered By: Deon Pilling on 01/09/2020 13:36:45 -------------------------------------------------------------------------------- Encounter Discharge Information Details Patient Name: Date of Service: [name] 01/09/2020 12:30 PM Medical Record [id number] Patient Account Number: [id number] Date of Birth/Sex: Treating RN: September 15, 1946 (73 y.o. Oval Linsey Primary Care Jemmie Ledgerwood: [name] Other Clinician: Referring Ramie Palladino: Treating Dustine Stickler/Extender:Robson, [name],  [name] in Treatment: 1 Encounter Discharge Information Items Discharge Condition: Stable Ambulatory Status: Walker Discharge Destination: Home Transportation: Private Auto Accompanied By: wife Schedule Follow-up Appointment: Yes Clinical Summary of Care: Patient Declined Electronic Signature(s) Signed: 01/10/2020 5:43:34 PM By: Carlene Coria RN Entered By: Carlene Coria on 01/09/2020 13:39:11 -------------------------------------------------------------------------------- Lower Extremity Assessment Details Patient Name: Date of Service: DYMOND, SPREEN 01/09/2020 12:30 PM Medical Record [id number] Patient Account Number: [id number] Date of Birth/Sex: Treating RN: 20-Jul-1946 (74 y.o. [name] Primary Care Coen Miyasato: [name] Other Clinician: Referring Lanette Ell: Treating Demba Nigh/Extender:Robson, [name], [name] in Treatment: 1 Edema Assessment Assessed: [Left: No] [Right: No] Edema: [Left: Ye] [Right: s] Calf Left: Right: Point of Measurement: cm From Medial Instep 41 cm cm Ankle Left: Right: Point of Measurement: cm From Medial Instep 31.3 cm cm Vascular Assessment Pulses: Dorsalis Pedis Palpable: [Left:Yes] Electronic Signature(s) Signed: 01/09/2020 6:20:13 PM By: Kela Millin Entered By: Kela Millin on 01/09/2020 12:58:48 -------------------------------------------------------------------------------- Multi Wound Chart Details Patient Name: Date of Service: [name] 01/09/2020 12:30 PM Medical Record [id number] Patient Account Number: [id number] Date of Birth/Sex: Treating RN: [birth year] (74 y.o. 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] [12:Venous Leg Ulcer] [N/A:N/A] 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] [12:Lower Extremity] Comorbid History: [11:Sleep Apnea, Congestive Sleep Apnea, Congestive N/A Heart Failure, Hypertension, Heart Failure, Hypertension, Type II Diabetes, Neuropathy, Received Chemotherapy] [12:Type  II Diabetes, Neuropathy, Received Chemotherapy] Date Acquired: [11:12/17/2019] [12:12/17/2019] [N/A:N/A] Weeks of Treatment: [11:1] [12:1] [N/A:N/A] Wound Status: [11:Healed - Epithelialized] [12:Healed - Epithelialized] [N/A:N/A] Measurements L x W x D 0x0x0 [12:0x0x0] [N/A:N/A] (cm) Area (cm) : [11:0] [12:0] [N/A:N/A] Volume (cm) : [11:0] [12:0] [N/A:N/A] % Reduction in Area: [11:100.00%] [12:100.00%] [N/A:N/A] % Reduction in Volume: 100.00% [12:100.00%] [N/A:N/A] Classification: [11:Full Thickness Without Exposed Support Structures Exposed Support Structures] [12:Full Thickness Without] [N/A:N/A] Exudate Amount: [11:None Present] [12:None Present] [N/A:N/A] Wound Margin: [11:Flat and Intact] [12:Flat and Intact] [N/A:N/A] Granulation Amount: [11:None Present (0%)] [12:None Present (0%)] [N/A:N/A] Necrotic Amount: [11:None Present (0%)] [12:None Present (0%)] [N/A:N/A] Exposed Structures: [11:Fascia: No Fat Layer (Subcutaneous Tissue) Exposed: No Tendon: No Muscle: No Joint: No Bone: No Large (67-100%)] [12:Fascia: No Fat Layer (Subcutaneous Tissue) Exposed: No Tendon: No Muscle: No Joint: No Bone: No Medium (34-66%)] [N/A:N/A N/A] Treatment Notes Electronic Signature(s) Signed: 01/09/2020 6:03:33 PM By: Linton Ham MD Signed: 01/09/2020  6:22:19 PM By: Deon Pilling Entered By: Linton Ham on 01/09/2020 13:45:48 -------------------------------------------------------------------------------- Multi-Disciplinary Care Plan Details Patient Name: Date of Service: [name] 01/09/2020 12:30 PM Medical Record [id number] Patient Account Number: [id number] Date of Birth/Sex: Treating RN: Oct 27, 1945 (74 y.o. [name] Primary Care Gabrielle Wakeland: [name] Other Clinician: Referring Lulu Hirschmann: Treating Aubryana Vittorio/Extender:Robson, [name], [name] in Treatment: 1 Active Inactive Electronic Signature(s) Signed: 01/09/2020 6:22:19 PM By: Deon Pilling Entered By: Deon Pilling on 01/09/2020 13:22:41 -------------------------------------------------------------------------------- Pain Assessment Details Patient Name: Date of Service: HARVEL, MESKILL 01/09/2020 12:30 PM Medical Record [id number] Patient Account Number: [id number] Date of Birth/Sex: Treating RN: 1945/10/31 (74 y.o. [name] Primary Care Manuel Dall: [name] Other Clinician: Referring Ibraheem Voris: Treating Margerie Fraiser/Extender:Robson, [name], [name] in Treatment: 1 Active Problems Location of Pain Severity and Description of Pain Patient Has Paino No Patient Has Paino No Site Locations Pain Management and Medication Current Pain Management: Electronic Signature(s) Signed: 01/09/2020 6:20:13 PM By: Kela Millin Entered By: Kela Millin on 01/09/2020 12:51:52 -------------------------------------------------------------------------------- Patient/Caregiver Education Details Patient Name: Date of Service: [name] 3/25/2021andnbsp12:30 PM Medical Record [phone] Patient Account Number: [id number] Date of Birth/Gender: Treating RN: 05-Feb-1946 (73 y.o. [name] Primary Care Physician: [name] Other Clinician: Referring Physician: Treating  Physician/Extender:Robson, [name], [name] in Treatment: 1 Education Assessment Education Provided To: Patient Education Topics Provided Wound/Skin Impairment: Handouts: Skin Care Do's and Dont's Methods: Explain/Verbal Responses: Reinforcements needed Electronic Signature(s) Signed: 01/09/2020 6:22:19 PM By: Deon Pilling Entered By: Deon Pilling on 01/09/2020 13:22:51 -------------------------------------------------------------------------------- Wound Assessment Details Patient Name: Date of Service: DEMARQUES, PILZ 01/09/2020 12:30 PM Medical Record [id number] Patient Account Number: [id number] Date of Birth/Sex: Treating RN: June 15, 1946 (74 y.o. [name], Tammi Klippel Primary Care Titus Drone: [name] Other Clinician: Referring Darleene Cumpian: Treating Kayshaun Polanco/Extender:Robson, [name], [name] in Treatment: 1 Wound Status Wound Number: [id number] Primary Venous Leg Ulcer Etiology: Wound Location: Left, Distal, Anterior Lower Leg Secondary Diabetic Wound/Ulcer of the Lower Extremity Wounding Event: Gradually Appeared Etiology: Date Acquired: 12/17/2019 Wound Healed - Epithelialized Weeks Of Treatment: 1 Status: Clustered Wound: No Comorbid Sleep Apnea, Congestive Heart Failure, History: Hypertension, Type II Diabetes, Neuropathy, Received Chemotherapy Photos Photo Uploaded By: Mikeal Hawthorne on 01/10/2020 12:57:44 Wound Measurements Length: (cm) 0 % Reduct Width: (cm) 0 % Reduct Depth: (cm) 0 Epitheli Area: (cm) 0 Tunneli Volume: (cm) 0 Undermi Wound Description Full Thickness Without Exposed Support Foul Od Classification: Structures Slough/ Wound Flat and Intact Margin: Exudate None Present Amount:  Wound Bed Granulation Amount: None Present (0%) Necrotic Amount: None Present (0%) Fascia Fat Lay Tendon Muscle Joint E Bone Ex Electronic Signature(s) Signed: 01/09/2020 6:22:19 PM By: Deon Pilling Entered By: Deon Pilling on  03/25 or After Cleansing: No Fibrino No Exposed Structure Exposed: No er (Subcutaneous Tissue) Exposed: No Exposed: No Exposed: No xposed: No posed: No /2021 13:30:33 ion in Area: 100% ion in Volume: 100% alization: Large (67-100%) ng: No ning: No -------------------------------------------------------------------------------- Wound Assessment Details Patient Name: Date of Service: MIKEL, HARDGROVE 01/09/2020 12:30 PM Medical Record [id number] Patient Account Number: [id number] Date of Birth/Sex: Treating RN: 04-11-1946 (74 y.o. [name], Meta.Reding Primary Care Amadou Katzenstein: [name] Other Clinician: Referring Wyeth Hoffer: Treating Leeah Politano/Extender:Robson, [name], [name] in Treatment: 1 Wound Status Wound Number: [id number] Primary Venous Leg Ulcer Etiology: Wound Location: Left, Anterior Lower Leg Secondary Diabetic Wound/Ulcer of the Lower Extremity Wounding Event: Gradually Appeared Etiology: Date Acquired: 12/17/2019 Wound Healed - Epithelialized Weeks Of Treatment: 1 Status: Clustered Wound: No Comorbid Sleep Apnea, Congestive Heart Failure, History: Hypertension, Type II Diabetes, Neuropathy, Received Chemotherapy Photos Photo Uploaded By: Mikeal Hawthorne on 01/10/2020 12:57:45 Wound Measurements Length: (cm) 0 % Reduct Width: (cm) 0 % Reduct Depth: (cm) 0 Epitheli Area: (cm) 0 Tunneli Volume: (cm) 0 Undermi Wound Description Classification: Full Thickness Without Exposed Support Foul Od Structures Slough/ Wound Flat and Intact Margin: Exudate None Present Amount: Wound Bed Granulation Amount: None Present (0%) Necrotic Amount: None Present (0%) Fascia Exp Fat Layer Tendon Exp Muscle Exp Joint Expo Bone Expos or After Cleansing: No Fibrino No Exposed Structure osed: No (Subcutaneous Tissue) Exposed: No osed: No osed: No sed: No ed: No ion in Area: 100% ion in Volume: 100% alization: Medium (34-66%) ng: No ning:  No Electronic Signature(s) Signed: 01/09/2020 6:22:19 PM By: Deon Pilling Entered By: Deon Pilling on 01/09/2020 13:30:33 -------------------------------------------------------------------------------- Vitals Details Patient Name: Date of Service: [name] 01/09/2020 12:30 PM Medical Record [id number] Patient Account Number: [id number] Date of Birth/Sex: Treating RN: 10-Mar-1946 (74 y.o. [name] Primary Care Migdalia Olejniczak: [name] Other Clinician: Referring Jamarkis Branam: Treating Sophonie Goforth/Extender:Robson, [name], [name] in Treatment: 1 Vital Signs Time Taken: 12:45 Temperature (F): 98.1 Height (in): 76 Pulse (bpm): 56 Weight (lbs): 323 Respiratory Rate (breaths/min): 19 Body Mass Index (BMI): 39.3 Blood Pressure (mmHg): 103/52 Reference Range: 80 - 120 mg / dl Electronic Signature(s) Signed: 01/09/2020 6:20:13 PM By: Kela Millin Entered By: Kela Millin on 01/09/2020 12:50:47

## 2020-01-29 ENCOUNTER — Other Ambulatory Visit: Payer: Self-pay | Admitting: Family Medicine

## 2020-02-21 ENCOUNTER — Other Ambulatory Visit: Payer: Self-pay

## 2020-02-24 ENCOUNTER — Ambulatory Visit (INDEPENDENT_AMBULATORY_CARE_PROVIDER_SITE_OTHER): Payer: Medicare Other | Admitting: Family Medicine

## 2020-02-24 ENCOUNTER — Encounter: Payer: Self-pay | Admitting: Family Medicine

## 2020-02-24 VITALS — BP 130/70 | HR 56 | Temp 97.7°F | Wt 339.2 lb

## 2020-02-24 DIAGNOSIS — S90121A Contusion of right lesser toe(s) without damage to nail, initial encounter: Secondary | ICD-10-CM

## 2020-02-24 NOTE — Progress Notes (Signed)
   Subjective:    Patient ID: AYAANSH SMAIL, male    DOB: [birth year], 74 y.o.   MRN: [id number]  HPI Here for an apparent injury to the right great toe. He noticed this about 2 weeks ago, but he does not remember any trauma to the toe. However he has numbness in both feet, so he may have not realized it when it happened. He noticed swelling at the base of the nail and then it turned purple and then black. It is not painful.    Review of Systems  Constitutional: Negative.   Respiratory: Negative.   Cardiovascular: Negative.   Skin: Positive for wound.       Objective:   Physical Exam Constitutional:      Appearance: He is obese.     Comments: Walks with a walker   Cardiovascular:     Rate and Rhythm: Normal rate and regular rhythm.     Pulses: Normal pulses.     Heart sounds: Normal heart sounds.  Pulmonary:     Effort: Pulmonary effort is normal.     Breath sounds: Normal breath sounds.  Skin:    Comments: The right great toenail is enlarged due to fungal growth. It is intact and not loose. At the base of the nail there is a large fluctuant pocket which contains blood under the skin. This is not tender. No signs of infection.   Neurological:     Mental Status: He is alert.           Assessment & Plan:  This is a hematoma which appears to be the result of some end on trauma to the nail. We opened this with a sterile needle and we extracted a moderate amount of dark partially clotted blood. This was covered with a Bandaid. He will keep this covered and protected and it should heal up fine.  Alysia Penna, MD

## 2020-04-07 ENCOUNTER — Other Ambulatory Visit: Payer: Self-pay | Admitting: Neurology

## 2020-04-17 ENCOUNTER — Other Ambulatory Visit: Payer: Self-pay

## 2020-04-17 ENCOUNTER — Ambulatory Visit (INDEPENDENT_AMBULATORY_CARE_PROVIDER_SITE_OTHER): Payer: Medicare Other | Admitting: Family Medicine

## 2020-04-17 ENCOUNTER — Encounter: Payer: Self-pay | Admitting: Family Medicine

## 2020-04-17 VITALS — BP 132/60 | HR 58 | Temp 98.3°F | Wt 338.6 lb

## 2020-04-17 DIAGNOSIS — M7521 Bicipital tendinitis, right shoulder: Secondary | ICD-10-CM

## 2020-04-17 MED ORDER — METHYLPREDNISOLONE 4 MG PO TBPK: ORAL_TABLET | ORAL | 0 refills | Status: DC

## 2020-04-17 NOTE — Progress Notes (Signed)
° °  Subjective:    Patient ID: [name], male    DOB: 07/09/1946, 74 y.o.   MRN: [id number]  HPI Here for one month of intermittent sharp pains in the anterior right shoulder. No neck pain. No pain down the arm. He takes Tylenol for it and sometimes this helps. No recent trauma, but he notes that he and his wife have been helping to take care of their 6 month old granddaughter a lot in the past few months. He has spent a lot of time holding and carrying her.    Review of Systems  Constitutional: Negative.   Respiratory: Negative.   Cardiovascular: Negative.   Musculoskeletal: Positive for arthralgias.       Objective:   Physical Exam Constitutional:      Appearance: Normal appearance.  Cardiovascular:     Rate and Rhythm: Normal rate and regular rhythm.     Pulses: Normal pulses.     Heart sounds: Normal heart sounds.  Pulmonary:     Effort: Pulmonary effort is normal.     Breath sounds: Normal breath sounds.  Musculoskeletal:     Comments: The right shoulder shows full ROM and no crepitus. He is tender in the bicipital groove and along the proximal biceps muscle.   Neurological:     Mental Status: He is alert.           Assessment & Plan:  Biceps tendonitis. This is an overuse injury, and I told him to rest the shoulder as much as possible. When carrying his granddaughter he should use the left arm as much as possible. Ice the area several times a day. He is given a Medrol dose pack. Recheck prn.  Alysia Penna, MD

## 2020-04-27 ENCOUNTER — Other Ambulatory Visit: Payer: Self-pay | Admitting: Cardiology

## 2020-04-27 ENCOUNTER — Other Ambulatory Visit: Payer: Self-pay | Admitting: Family Medicine

## 2020-04-30 ENCOUNTER — Telehealth: Payer: Self-pay | Admitting: *Deleted

## 2020-04-30 NOTE — Telephone Encounter (Signed)
A detailed message was left,re: his follow up visit. °

## 2020-05-08 ENCOUNTER — Other Ambulatory Visit: Payer: Self-pay | Admitting: Cardiology

## 2020-05-08 NOTE — Telephone Encounter (Signed)
Prescription refill request for Eliquis received. Indication: Atrial Fibrillation Last office visit: 11/07/2019 Dr [name] Scr: 1.41 11/04/2019 Age: 74 Weight: 153.6 kg  Prescription refilled

## 2020-05-18 ENCOUNTER — Encounter: Payer: Self-pay | Admitting: Family Medicine

## 2020-05-19 MED ORDER — ALPRAZOLAM 0.5 MG PO TABS
0.5000 mg | ORAL_TABLET | Freq: Three times a day (TID) | ORAL | 2 refills | Status: DC | PRN
Start: 2020-05-19 — End: 2021-07-14

## 2020-05-19 NOTE — Telephone Encounter (Signed)
I sent in some Xanax for him to try

## 2020-05-28 ENCOUNTER — Encounter: Payer: Self-pay | Admitting: General Practice

## 2020-06-19 ENCOUNTER — Ambulatory Visit: Payer: Medicare Other | Admitting: Family Medicine

## 2020-06-26 ENCOUNTER — Ambulatory Visit: Payer: Medicare Other | Admitting: Cardiology

## 2020-07-14 ENCOUNTER — Other Ambulatory Visit: Payer: Self-pay

## 2020-07-14 ENCOUNTER — Ambulatory Visit (INDEPENDENT_AMBULATORY_CARE_PROVIDER_SITE_OTHER): Payer: Medicare Other | Admitting: Cardiology

## 2020-07-14 ENCOUNTER — Encounter: Payer: Self-pay | Admitting: Cardiology

## 2020-07-14 VITALS — BP 115/71 | HR 57 | Temp 97.5°F | Ht 76.0 in | Wt 329.0 lb

## 2020-07-14 DIAGNOSIS — Z7189 Other specified counseling: Secondary | ICD-10-CM

## 2020-07-14 DIAGNOSIS — I480 Paroxysmal atrial fibrillation: Secondary | ICD-10-CM | POA: Diagnosis not present

## 2020-07-14 DIAGNOSIS — I10 Essential (primary) hypertension: Secondary | ICD-10-CM | POA: Diagnosis not present

## 2020-07-14 DIAGNOSIS — I428 Other cardiomyopathies: Secondary | ICD-10-CM

## 2020-07-14 DIAGNOSIS — I5042 Chronic combined systolic (congestive) and diastolic (congestive) heart failure: Secondary | ICD-10-CM

## 2020-07-14 DIAGNOSIS — R609 Edema, unspecified: Secondary | ICD-10-CM | POA: Diagnosis not present

## 2020-07-14 NOTE — Progress Notes (Signed)
Cardiology Office Note:    Date:  07/14/2020   ID:  AMELIA MACKEN, DOB 09/07/1946, MRN [id number]  PCP:  [name], MD  Cardiologist:  Buford Dresser, MD PhD  Referring MD: [name], MD   CC: follow up  History of Present Illness:    [name] is a 74 y.o. male with a hx of atrial fibrillation, type II diabetes, hypertension, dyslipidemia, previously recovered systolic heart failure (EF 35-40% 2015 to 55-60% 2015) with most recent EF 45-50%.  I met him via video visit 05/03/19, please see notes.  Today: Here with his wife today.   Doing the same overall. Weight is down a bit, congratulated. Working on diet. No routine exercise, limited mobility, largely sedentary. He is 329 lbs today, was 400 lbs 04/2019.   Right knee wound now healed. Has compression garments on bilateral LE. Had a fall from his front steps, hurt his right shoulder about 2 mos ago, still sore.   Denies chest pain, shortness of breath at rest or with normal exertion. No PND, orthopnea, weight gain. No syncope or palpitations.   Past Medical History:  Diagnosis Date  . Chronic systolic CHF (congestive heart failure) ([place])    a. Echo (08/28/13): Mild LVH, EF 35-40%, diffuse HK, moderate to severe LAE.  . Diabetes mellitus without complication ([place])   . Diverticulitis   . Headache(784.0)   . Hyperlipidemia   . Hypertension   . Lymphoma, non Hodgkin's    sees Dr. [name]   . Morbid obesity ([place])   . NICM (nonischemic cardiomyopathy) ([place])    a. R/L Heart cath 08/30/13: RA mean 8, RV 30/0, PA 27/12, mean PCWP 9, CO 5.67, CI 1.98; normal coronary arteries  . Sleep apnea     Past Surgical History:  Procedure Laterality Date  . CARDIOVERSION N/A 06/28/2019   Procedure: CARDIOVERSION;  Surgeon: Lelon Perla, MD;  Location: [facility] ENDOSCOPY;  Service: Cardiovascular;  Laterality: N/A;  . HERNIA REPAIR     umblical  . incarcerated hernia     vental 11/19/08 Dr. [name]  . LEFT  AND RIGHT HEART CATHETERIZATION WITH CORONARY ANGIOGRAM N/A 08/30/2013   Procedure: LEFT AND RIGHT HEART CATHETERIZATION WITH CORONARY ANGIOGRAM;  Surgeon: [name], MD;  Location: [facility] CATH LAB;  Service: Cardiovascular;  Laterality: N/A;    Current Medications: Current Outpatient Medications on File Prior to Visit  Medication Sig  . ALPRAZolam (XANAX) 0.5 MG tablet Take 1 tablet (0.5 mg total) by mouth 3 (three) times daily as needed for anxiety.  Marland Kitchen amiodarone (PACERONE) 200 MG tablet TAKE 1 TABLET BY MOUTH TWICE A DAY  . amLODipine (NORVASC) 5 MG tablet TAKE 1 TABLET BY MOUTH EVERY DAY  . atorvastatin (LIPITOR) 20 MG tablet TAKE 1 TABLET BY MOUTH EVERY DAY  . ELIQUIS 5 MG TABS tablet TAKE 1 TABLET BY MOUTH TWICE A DAY  . FLUoxetine (PROZAC) 40 MG capsule TAKE 1 CAPSULE BY MOUTH EVERY DAY  . furosemide (LASIX) 40 MG tablet TAKE 1 TABLET BY MOUTH EVERY DAY  . metFORMIN (GLUCOPHAGE) 500 MG tablet TAKE 1 TABLET BY MOUTH TWICE DAILY WITH A MEAL  . metoprolol (TOPROL-XL) 200 MG 24 hr tablet TAKE 1 TABLET BY MOUTH EVERY DAY  . Multiple Vitamins-Minerals (MULTIVITAMIN MEN 50+) TABS Take 1 tablet by mouth daily.  Marland Kitchen olmesartan (BENICAR) 40 MG tablet TAKE 1 TABLET BY MOUTH EVERY DAY  . polyethylene glycol (MIRALAX / GLYCOLAX) packet Take 17 g by mouth daily  as needed for moderate constipation.  . potassium chloride (KLOR-CON) 10 MEQ tablet TAKE 1 TABLET BY MOUTH EVERY DAY  . spironolactone (ALDACTONE) 25 MG tablet TAKE 1 TABLET BY MOUTH EVERY DAY   No current facility-administered medications on file prior to visit.     Allergies:   Patient has no known allergies.   Social History   Tobacco Use  . Smoking status: Former Smoker    Packs/day: 2.00    Years: 52.00    Pack years: 104.00    Types: Cigarettes    Start date: 08/27/1965    Quit date: 08/28/1999    Years since quitting: 20.8  . Smokeless tobacco: Never Used  . Tobacco comment: quit 13 years ago  Vaping Use  . Vaping Use:  Never used  Substance Use Topics  . Alcohol use: Yes    Alcohol/week: 0.0 standard drinks    Comment: occ  . Drug use: No    Family History: The patient's family history includes Cancer in his father and maternal grandfather; Diabetes in his father and paternal grandfather; Heart failure in his mother; Hypertension in his father and mother; Stroke in his maternal grandmother.  ROS:   Please see the history of present illness.  Additional pertinent ROS negative except as noted.   EKGs/Labs/Other Studies Reviewed:    The following studies were reviewed today: Echo 04/08/19 1. The left ventricle has mildly reduced systolic function, with an ejection fraction of 45-50%. The cavity size was normal. There is moderately increased left ventricular wall thickness. Left ventricular diastolic function could not be evaluated  secondary to atrial fibrillation. Left ventricular diffuse hypokinesis. 2. Left atrial size was severely dilated. 3. Right atrial size was mildly dilated. 4. The mitral valve is abnormal. Mild thickening of the mitral valve leaflet. 5. The aortic valve is tricuspid. Mild thickening of the aortic valve. Aortic valve regurgitation is trivial by color flow Doppler. 6. There is mild dilatation of the aortic root measuring 43 mm. 7. Extremely limited; definity used; EF difficult to quantitate but appears mildly reduced; moderate LVH; mildly dilated aortic root; trace AI; biatrial enlargement.  Cath 08/30/2013 Procedural Findings: Hemodynamics RA Mean 8 RV 30/0 PA  27/12 PCWP Mean 9 LV  186/101 AO  177/106  Oxygen saturations: PA 61 AO 94  Cardiac Output (Fick) 5.67  Cardiac Index (Fick) 1.98         Coronary angiography: Coronary dominance: right  Left mainstem: Normal  Left anterior descending (LAD): Normal             D1:normal             D2: normal             IM: normal  Left circumflex (LCx): normal             OM1: small and normal              OM2 : very large branching artery normal  Right coronary artery (RCA):  Dominant and normal  EKG:  EKG is personally reviewed.  The ekg ordered today demonstrates sinus bradycardia, first degree AV block, IVCD  Recent Labs: 11/04/2019: BUN 24; Creatinine, Ser 1.41; Hemoglobin 13.8; Platelets 181.0; Potassium 4.0; Sodium 140  Recent Lipid Panel    Component Value Date/Time   CHOL 249 (H) 02/24/2016 0930   TRIG 228.0 (H) 02/24/2016 0930   HDL 47.10 02/24/2016 0930   CHOLHDL 5 02/24/2016 0930   VLDL 45.6 (H) 02/24/2016 0930  LDLCALC 113 (H) 08/28/2013 0032   LDLDIRECT 180.0 02/24/2016 0930    Physical Exam:    VS:  BP 115/71   Pulse (!) 57   Temp (!) 97.5 F (36.4 C)   Ht _0  (1.93 m)   Wt (!) 329 lb (149.2 kg)   SpO2 96%   BMI 40.05 kg/m     Wt Readings from Last 3 Encounters:  07/14/20 (!) 329 lb (149.2 kg)  04/17/20 (!) 338 lb 9.6 oz (153.6 kg)  02/24/20 (!) 339 lb 3.2 oz (153.9 kg)    GEN: Well nourished, well developed in no acute distress HEENT: Normal, moist mucous membranes NECK: No JVD CARDIAC: regular rhythm, normal S1 and S2, no rubs or gallops. No murmur. VASCULAR: Radial and DP pulses 2+ bilaterally. No carotid bruits RESPIRATORY:  Clear to auscultation without rales, wheezing or rhonchi  ABDOMEN: Soft, non-tender, non-distended MUSCULOSKELETAL:  Ambulates independently SKIN: Warm and dry. Bilateral venous stasis changes, compression garments in place. Healed medial R knee wound. NEUROLOGIC:  Alert and oriented x 3. No focal neuro deficits noted. PSYCHIATRIC:  Normal affect   ASSESSMENT:    No diagnosis found. PLAN:    Atrial fibrillation: paroxysmal CHA2DS2/VAS Stroke Risk= 4  -has remained in SR since cardioversion 06/28/19 -continue amiodarone 200 mg BID. If weight loss continues, consider drop back to daily dose -Recheck LFTs, TFTs with next labs -rate controlled on metoprolol -continue apixaban 5 mg BID  Chronic systolic and  diastolic dysfunction: diffuse hypokinesis on echo, EF 45-50%. Chronic LE edema and dyspnea on exertion. Nonischemic cardiomyopathy based on cath 2014 at time of diagnosis. -improving symptoms with weight loss -continued heart failure education. Wife is primary caretaker -on metoprolol succinate 200 mg. Pulse today 57. Continue to monitor -reports he is back on olmesarta -continue chronic diuresis with furosemide 40 mg BID with K supplement. On spironolactone as well.  Chronic LE edema: suspect combination of chronic LV dysfunction/heart failure and chronic venous insufficiency -continue compression, elevation  Hypertension: at goal today -with continued weight loss, may need to scale back on medications in the future. Would drop amlodipine first given chronic edema.  Cardiac risk counseling and prevention recommendations: -recommend heart healthy/Mediterranean diet, with whole grains, fruits, vegetable, fish, lean meats, nuts, and olive oil. Limit salt. -recommend moderate walking, 3-5 times/week for 30-50 minutes each session. Aim for at least 150 minutes.week. Goal should be pace of 3 miles/hours, or walking 1.5 miles in 30 minutes -recommend avoidance of tobacco products. Avoid excess alcohol.  Plan for follow up: 6 mos or sooner PRN  Medication Adjustments/Labs and Tests Ordered: Current medicines are reviewed at length with the patient today.  Concerns regarding medicines are outlined above.  Orders Placed This Encounter  Procedures  . EKG 12-Lead   No orders of the defined types were placed in this encounter.   Patient Instructions  Medication Instructions:  Your Physician recommend you continue on your current medication as directed.    *If you need a refill on your cardiac medications before your next appointment, please call your pharmacy*   Lab Work: None ordered   Testing/Procedures: None ordered    Follow-Up: At [facility], you and your health needs are  our priority.  As part of our continuing mission to provide you with exceptional heart care, we have created designated Provider Care Teams.  These Care Teams include your primary Cardiologist (physician) and Advanced Practice Providers (APPs -  Physician Assistants and Nurse Practitioners) who all work together to  provide you with the care you need, when you need it.  We recommend signing up for the patient portal called "MyChart".  Sign up information is provided on this After Visit Summary.  MyChart is used to connect with patients for Virtual Visits (Telemedicine).  Patients are able to view lab/test results, encounter notes, upcoming appointments, etc.  Non-urgent messages can be sent to your provider as well.   To learn more about what you can do with MyChart, go to [email or web address].    Your next appointment:   6 month(s)  The format for your next appointment:   In Person  Provider:   Buford Dresser, MD       Signed, Buford Dresser, MD PhD 07/14/2020  [place]

## 2020-07-14 NOTE — Patient Instructions (Signed)

## 2020-07-15 ENCOUNTER — Encounter: Payer: Self-pay | Admitting: Cardiology

## 2020-07-17 ENCOUNTER — Ambulatory Visit: Payer: Medicare Other | Admitting: Family Medicine

## 2020-08-03 ENCOUNTER — Telehealth: Payer: Self-pay | Admitting: Family Medicine

## 2020-08-03 MED ORDER — METOPROLOL SUCCINATE ER 200 MG PO TB24: 200.0000 mg | ORAL_TABLET | Freq: Every day | ORAL | 0 refills | Status: DC

## 2020-08-03 NOTE — Telephone Encounter (Signed)
Done

## 2020-08-03 NOTE — Telephone Encounter (Signed)
Pt's wife is requesting a refill on Metoprolol sent to [facility] on [address] and [address]  *Please change this to pt's primary pharmacy and delete any other pharmacies in his chart.

## 2020-08-13 ENCOUNTER — Other Ambulatory Visit: Payer: Self-pay | Admitting: Family Medicine

## 2020-09-19 ENCOUNTER — Other Ambulatory Visit: Payer: Self-pay | Admitting: Cardiology

## 2020-09-21 NOTE — Telephone Encounter (Signed)
Prescription refill request for Eliquis received. Indication: Atrial Fibrillation Last office visit: 06/2020 [name] Scr: 1.4 10/2019 Age: 74 Weight:149.2 kg  Prescription refilled

## 2020-09-28 ENCOUNTER — Other Ambulatory Visit: Payer: Self-pay | Admitting: Family Medicine

## 2020-10-27 ENCOUNTER — Telehealth: Payer: Self-pay | Admitting: Family Medicine

## 2020-10-27 NOTE — Telephone Encounter (Signed)
Left message for patient to call back and schedule Medicare Annual Wellness Visit (AWV) either virtually or in office.   Last AWV no information please schedule at anytime with [facility] Nurse Health Advisor 1 or 2   This should be a 45 minute visit. 

## 2020-11-23 ENCOUNTER — Other Ambulatory Visit: Payer: Self-pay | Admitting: Family Medicine

## 2020-12-20 IMAGING — CT CT HEAD WITHOUT CONTRAST
3 series · 15 of 47 positions shown, 18 images · non-contrast
Comparison: Brain MRI 10/18/2008.  Head CT 12/07/2007.

CLINICAL DATA: Altered mental status.  Hallucinations.

EXAM:
CT HEAD WITHOUT CONTRAST
TECHNIQUE: Contiguous axial images were obtained from the base of the skull
through the vertex without intravenous contrast.

[Series 2: head wo · axial · 0.47mm/px · z∈[-185,-45]mm · 9 of 34 slices shown, 12 images]
[im 3/34  brain]
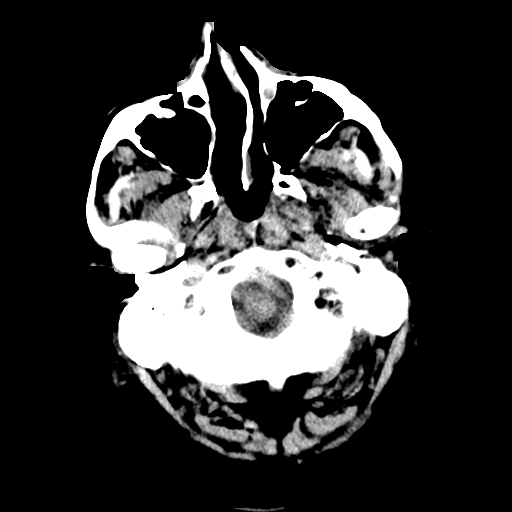
[im 3/34  bone]
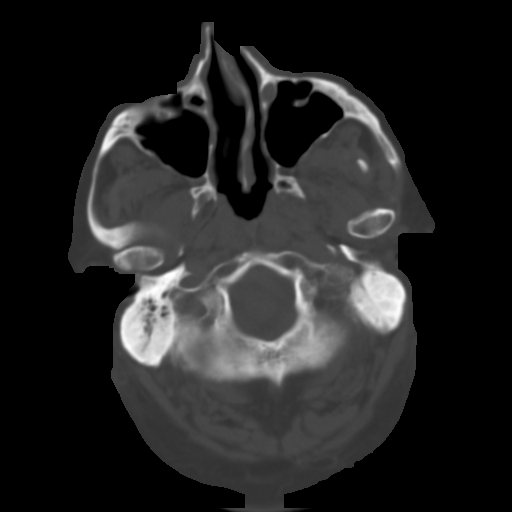
[im 6/34  brain]
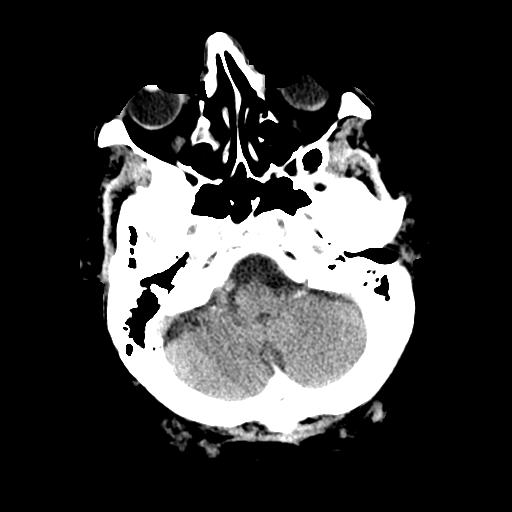
[im 10/34  brain]
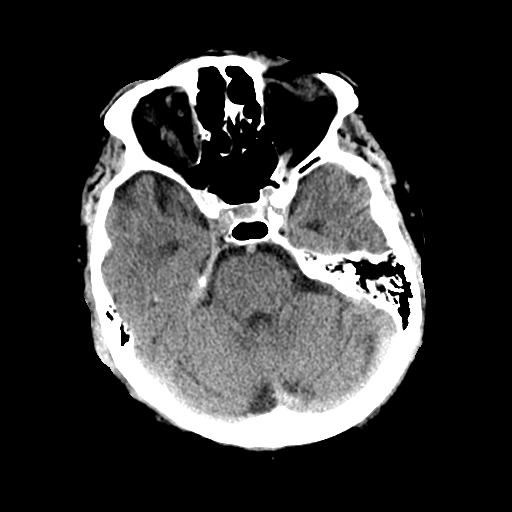
[im 13/34  brain]
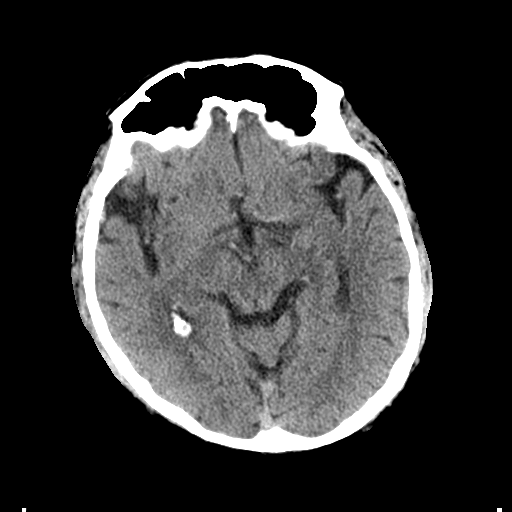
[im 18/34  brain]
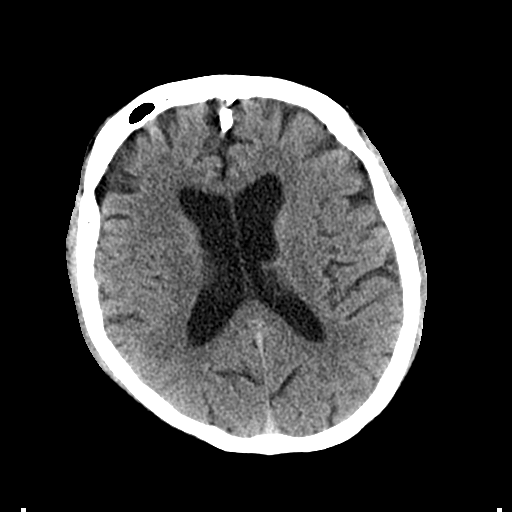
[im 18/34  bone]
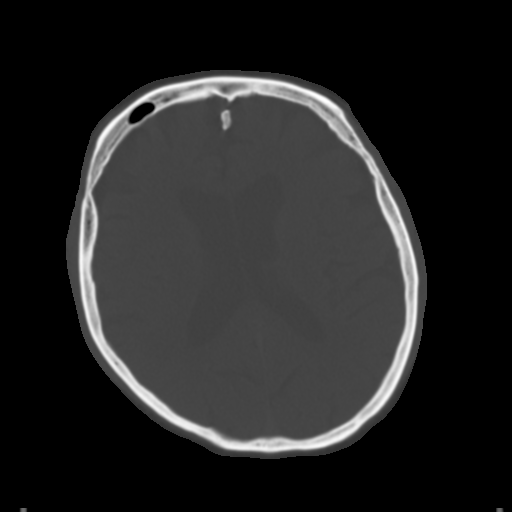
[im 21/34  brain]
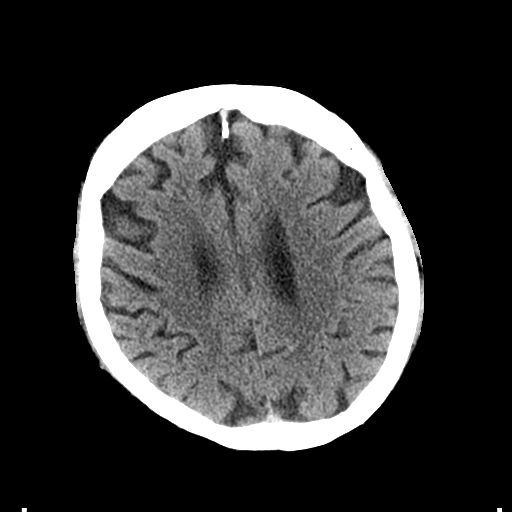
[im 24/34  brain]
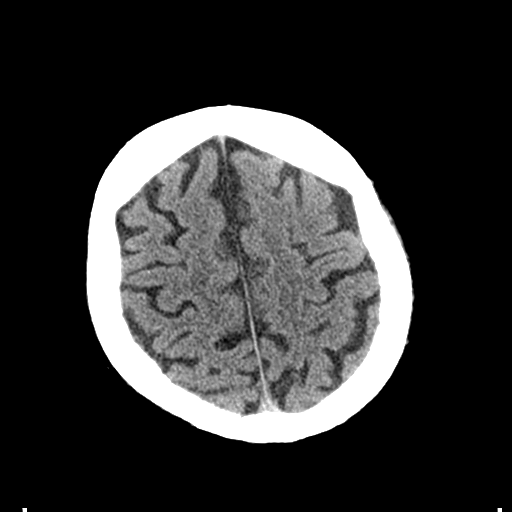
[im 28/34  brain]
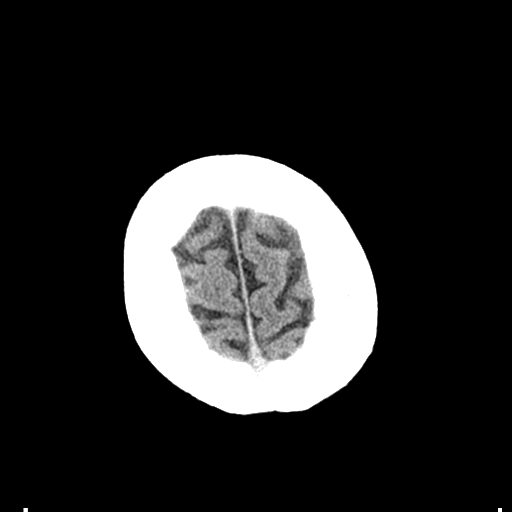
[im 31/34  brain]
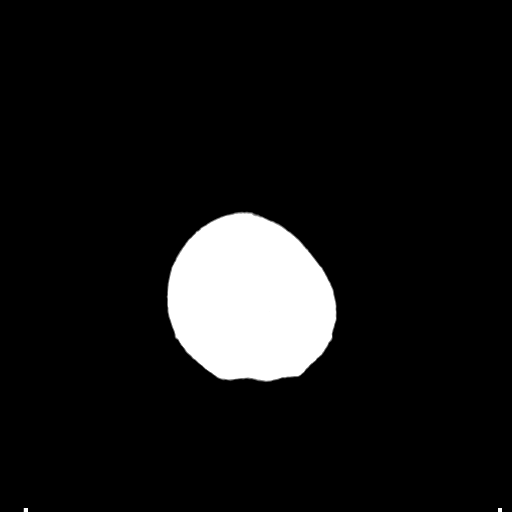
[im 31/34  bone]
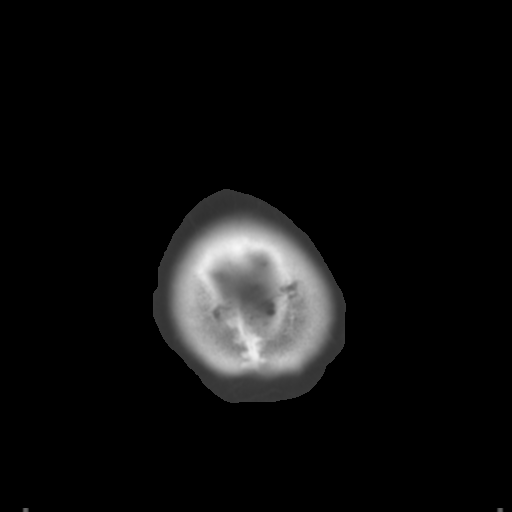

[Series 5: coronal soft tissue · coronal · 0.34mm/px · 3 of 77 slices shown]
[im 26/77  brain]
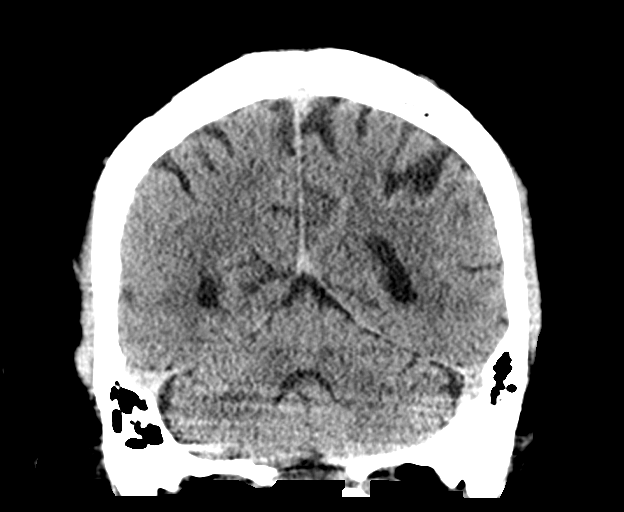
[im 34/77  brain]
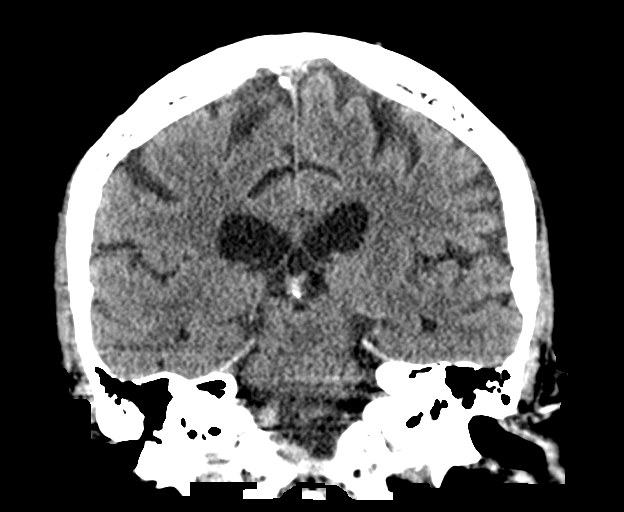
[im 43/77  brain]
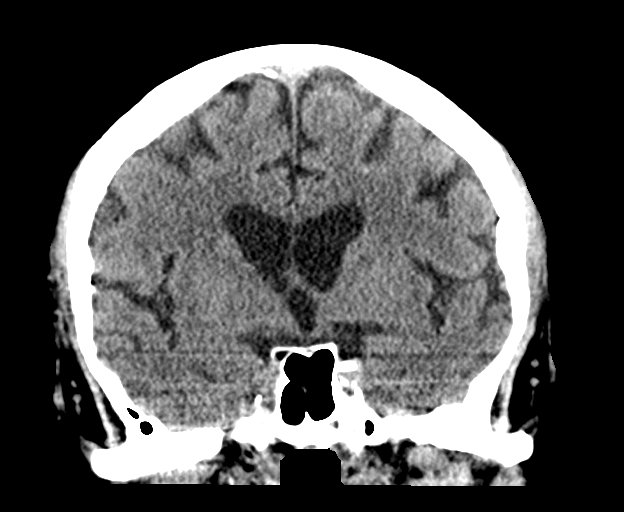

[Series 6: sagittal soft tissue · sagittal · 0.34mm/px · 3 of 68 slices shown]
[im 23/68  brain]
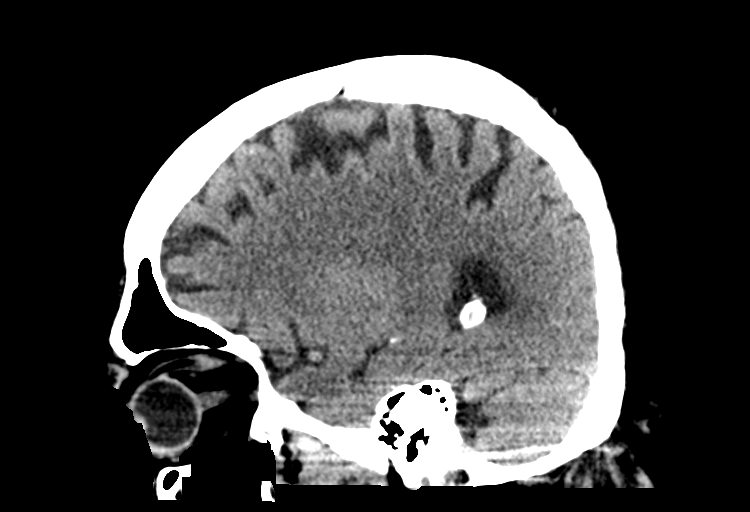
[im 34/68  brain]
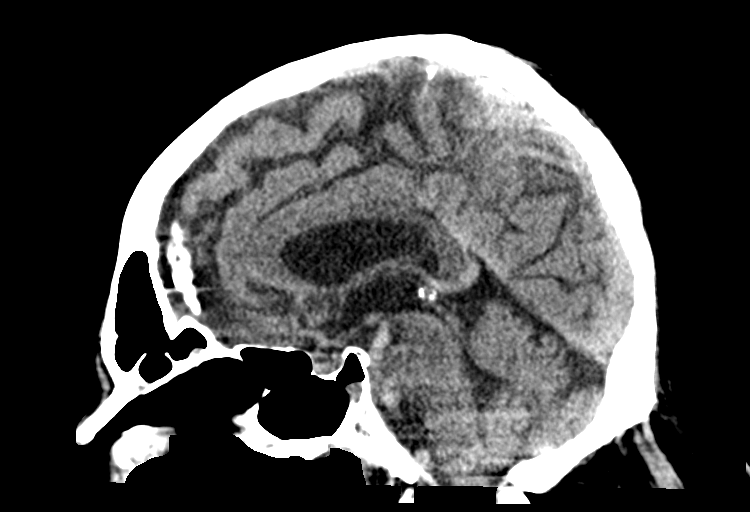
[im 45/68  brain]
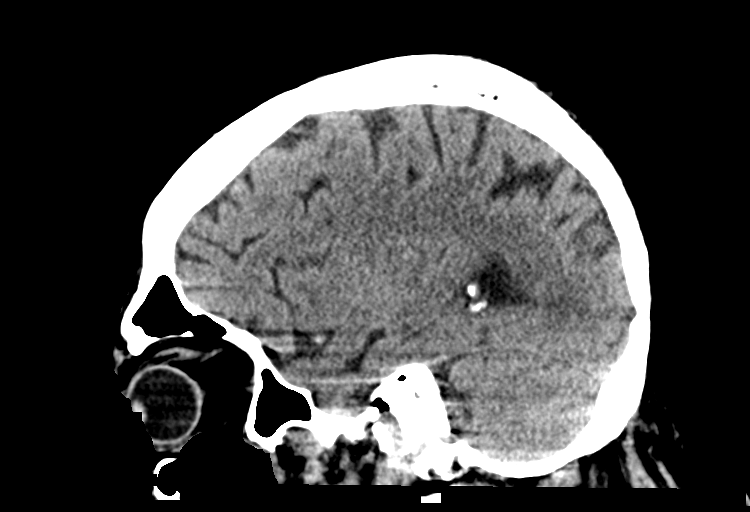

[15 of 47 positions shown; findings below may reference images not displayed]

FINDINGS: Brain: No evidence of acute infarction, hemorrhage, hydrocephalus,
extra-axial collection or mass lesion/mass effect. Mild atrophy
noted.

Vascular: No hyperdense vessel or unexpected calcification.

Skull: Intact.  No focal lesion.

Sinuses/Orbits: Left mastoid effusion is seen. There is scattered
ethmoid air cell disease and minimal mucosal thickening in the left
sphenoid sinus.

Other: None.
IMPRESSION: No acute intracranial abnormality.

Mild atrophy.

Left mastoid effusion. Mild ethmoid air cell and left sphenoid sinus
disease.

## 2020-12-28 ENCOUNTER — Telehealth: Payer: Self-pay | Admitting: Family Medicine

## 2020-12-28 NOTE — Telephone Encounter (Signed)
Left message for patient to call back and schedule Medicare Annual Wellness Visit (AWV) either virtually or in office. No detailed message left   Last AWV no information  please schedule at anytime with [facility] Nurse Health Advisor 1 or 2   This should be a 45 minute visit. 

## 2021-01-01 ENCOUNTER — Ambulatory Visit: Payer: Medicare Other | Admitting: Cardiology

## 2021-01-04 ENCOUNTER — Other Ambulatory Visit: Payer: Self-pay | Admitting: Family Medicine

## 2021-01-07 ENCOUNTER — Other Ambulatory Visit: Payer: Self-pay | Admitting: Family Medicine

## 2021-02-15 ENCOUNTER — Other Ambulatory Visit: Payer: Self-pay | Admitting: Family Medicine

## 2021-02-17 ENCOUNTER — Other Ambulatory Visit: Payer: Self-pay | Admitting: Cardiology

## 2021-03-22 ENCOUNTER — Telehealth: Payer: Self-pay | Admitting: Family Medicine

## 2021-03-22 NOTE — Telephone Encounter (Signed)
Left message for patient to call back and schedule Medicare Annual Wellness Visit (AWV) either virtually or in office.   AWV-I per [place] 04/17/11  please schedule at anytime with [facility] Nurse Health Advisor 1 or 2   This should be a 45 minute visit.  

## 2021-04-08 ENCOUNTER — Other Ambulatory Visit: Payer: Self-pay

## 2021-04-08 MED ORDER — AMLODIPINE BESYLATE 5 MG PO TABS: 1.0000 | ORAL_TABLET | Freq: Every day | ORAL | 0 refills | Status: DC

## 2021-04-28 ENCOUNTER — Ambulatory Visit (INDEPENDENT_AMBULATORY_CARE_PROVIDER_SITE_OTHER): Payer: Medicare Other

## 2021-04-28 DIAGNOSIS — Z0100 Encounter for examination of eyes and vision without abnormal findings: Secondary | ICD-10-CM

## 2021-04-28 DIAGNOSIS — Z1211 Encounter for screening for malignant neoplasm of colon: Secondary | ICD-10-CM | POA: Diagnosis not present

## 2021-04-28 DIAGNOSIS — Z0000 Encounter for general adult medical examination without abnormal findings: Secondary | ICD-10-CM

## 2021-04-28 NOTE — Patient Instructions (Signed)
[name] , Thank you for taking time to come for your Medicare Wellness Visit. I appreciate your ongoing commitment to your health goals. Please review the following plan we discussed and let me know if I can assist you in the future.   Screening recommendations/referrals: Colonoscopy: referral completed 04/28/2021 Recommended yearly ophthalmology/optometry visit for glaucoma screening and checkup Recommended yearly dental visit for hygiene and checkup  Vaccinations: Influenza vaccine: due in fall 2022  Pneumococcal vaccine: completed series Tdap vaccine: 11/22/2016 Shingles vaccine: will obtain local pharmacy     Advanced directives: will provide copies   Conditions/risks identified: none   Next appointment: 05/04/2021  @ [address]   Preventive Care 65 Years and Older, Male Preventive care refers to lifestyle choices and visits with your health care provider that can promote health and wellness. What does preventive care include? A yearly physical exam. This is also called an annual well check. Dental exams once or twice a year. Routine eye exams. Ask your health care provider how often you should have your eyes checked. Personal lifestyle choices, including: Daily care of your teeth and gums. Regular physical activity. Eating a healthy diet. Avoiding tobacco and drug use. Limiting alcohol use. Practicing safe sex. Taking low doses of aspirin every day. Taking vitamin and mineral supplements as recommended by your health care provider. What happens during an annual well check? The services and screenings done by your health care provider during your annual well check will depend on your age, overall health, lifestyle risk factors, and family history of disease. Counseling  Your health care provider may ask you questions about your: Alcohol use. Tobacco use. Drug use. Emotional well-being. Home and relationship well-being. Sexual activity. Eating habits. History  of falls. Memory and ability to understand (cognition). Work and work Statistician. Screening  You may have the following tests or measurements: Height, weight, and BMI. Blood pressure. Lipid and cholesterol levels. These may be checked every 5 years, or more frequently if you are over 46 years old. Skin check. Lung cancer screening. You may have this screening every year starting at age 75 if you have a 30-pack-year history of smoking and currently smoke or have quit within the past 15 years. Fecal occult blood test (FOBT) of the stool. You may have this test every year starting at age 44. Flexible sigmoidoscopy or colonoscopy. You may have a sigmoidoscopy every 5 years or a colonoscopy every 10 years starting at age 46. Prostate cancer screening. Recommendations will vary depending on your family history and other risks. Hepatitis C blood test. Hepatitis B blood test. Sexually transmitted disease (STD) testing. Diabetes screening. This is done by checking your blood sugar (glucose) after you have not eaten for a while (fasting). You may have this done every 1-3 years. Abdominal aortic aneurysm (AAA) screening. You may need this if you are a current or former smoker. Osteoporosis. You may be screened starting at age 54 if you are at high risk. Talk with your health care provider about your test results, treatment options, and if necessary, the need for more tests. Vaccines  Your health care provider may recommend certain vaccines, such as: Influenza vaccine. This is recommended every year. Tetanus, diphtheria, and acellular pertussis (Tdap, Td) vaccine. You may need a Td booster every 10 years. Zoster vaccine. You may need this after age 46. Pneumococcal 13-valent conjugate (PCV13) vaccine. One dose is recommended after age 72. Pneumococcal polysaccharide (PPSV23) vaccine. One dose is recommended after age 7. Talk to your health  care provider about which screenings and vaccines you need  and how often you need them. This information is not intended to replace advice given to you by your health care provider. Make sure you discuss any questions you have with your health care provider. Document Released: 10/30/2015 Document Revised: 06/22/2016 Document Reviewed: 08/04/2015 Elsevier Interactive Patient Education  2017 [employer] Prevention in the Home Falls can cause injuries. They can happen to people of all ages. There are many things you can do to make your home safe and to help prevent falls. What can I do on the outside of my home? Regularly fix the edges of walkways and driveways and fix any cracks. Remove anything that might make you trip as you walk through a door, such as a raised step or threshold. Trim any bushes or trees on the path to your home. Use bright outdoor lighting. Clear any walking paths of anything that might make someone trip, such as rocks or tools. Regularly check to see if handrails are loose or broken. Make sure that both sides of any steps have handrails. Any raised decks and porches should have guardrails on the edges. Have any leaves, snow, or ice cleared regularly. Use sand or salt on walking paths during winter. Clean up any spills in your garage right away. This includes oil or grease spills. What can I do in the bathroom? Use night lights. Install grab bars by the toilet and in the tub and shower. Do not use towel bars as grab bars. Use non-skid mats or decals in the tub or shower. If you need to sit down in the shower, use a plastic, non-slip stool. Keep the floor dry. Clean up any water that spills on the floor as soon as it happens. Remove soap buildup in the tub or shower regularly. Attach bath mats securely with double-sided non-slip rug tape. Do not have throw rugs and other things on the floor that can make you trip. What can I do in the bedroom? Use night lights. Make sure that you have a light by your bed that is easy  to reach. Do not use any sheets or blankets that are too big for your bed. They should not hang down onto the floor. Have a firm chair that has side arms. You can use this for support while you get dressed. Do not have throw rugs and other things on the floor that can make you trip. What can I do in the kitchen? Clean up any spills right away. Avoid walking on wet floors. Keep items that you use a lot in easy-to-reach places. If you need to reach something above you, use a strong step stool that has a grab bar. Keep electrical cords out of the way. Do not use floor polish or wax that makes floors slippery. If you must use wax, use non-skid floor wax. Do not have throw rugs and other things on the floor that can make you trip. What can I do with my stairs? Do not leave any items on the stairs. Make sure that there are handrails on both sides of the stairs and use them. Fix handrails that are broken or loose. Make sure that handrails are as long as the stairways. Check any carpeting to make sure that it is firmly attached to the stairs. Fix any carpet that is loose or worn. Avoid having throw rugs at the top or bottom of the stairs. If you do have throw rugs, attach them to  the floor with carpet tape. Make sure that you have a light switch at the top of the stairs and the bottom of the stairs. If you do not have them, ask someone to add them for you. What else can I do to help prevent falls? Wear shoes that: Do not have high heels. Have rubber bottoms. Are comfortable and fit you well. Are closed at the toe. Do not wear sandals. If you use a stepladder: Make sure that it is fully opened. Do not climb a closed stepladder. Make sure that both sides of the stepladder are locked into place. Ask someone to hold it for you, if possible. Clearly mark and make sure that you can see: Any grab bars or handrails. First and last steps. Where the edge of each step is. Use tools that help you move  around (mobility aids) if they are needed. These include: Canes. Walkers. Scooters. Crutches. Turn on the lights when you go into a dark area. Replace any light bulbs as soon as they burn out. Set up your furniture so you have a clear path. Avoid moving your furniture around. If any of your floors are uneven, fix them. If there are any pets around you, be aware of where they are. Review your medicines with your doctor. Some medicines can make you feel dizzy. This can increase your chance of falling. Ask your doctor what other things that you can do to help prevent falls. This information is not intended to replace advice given to you by your health care provider. Make sure you discuss any questions you have with your health care provider. Document Released: 07/30/2009 Document Revised: 03/10/2016 Document Reviewed: 11/07/2014 Elsevier Interactive Patient Education  2017 [employer].

## 2021-04-28 NOTE — Progress Notes (Signed)
Subjective:   [name] is a 75 y.o. male who presents for an Initial Medicare Annual Wellness Visit.  I connected with [name] today by telephone and verified that I am speaking with the correct person using two identifiers. Location patient: home Location provider: work Persons participating in the virtual visit: patient, provider.   I discussed the limitations, risks, security and privacy concerns of performing an evaluation and management service by telephone and the availability of in person appointments. I also discussed with the patient that there may be a patient responsible charge related to this service. The patient expressed understanding and verbally consented to this telephonic visit.    Interactive audio and video telecommunications were attempted between this provider and patient, however failed, due to patient having technical difficulties OR patient did not have access to video capability.  We continued and completed visit with audio only.    Review of Systems    N/a       Objective:    There were no vitals filed for this visit. There is no height or weight on file to calculate BMI.  Advanced Directives 06/28/2019 04/07/2019 02/04/2017 09/27/2015 08/27/2013  Does Patient Have a Medical Advance Directive? Yes Yes No No Patient does not have advance directive  Type of Advance Directive [place];Living will [place]  Does patient want to make changes to medical advance directive? - No - Guardian declined - - -  Copy of [place] in Chart? No - copy requested - - - -  Would patient like information on creating a medical advance directive? - - - No - patient declined information -  Pre-existing out of facility DNR order (yellow form or pink MOST form) - - - - No    Current Medications (verified) Outpatient Encounter Medications as of 04/28/2021  Medication Sig   ALPRAZolam (XANAX) 0.5 MG  tablet Take 1 tablet (0.5 mg total) by mouth 3 (three) times daily as needed for anxiety.   amiodarone (PACERONE) 200 MG tablet TAKE 1 TABLET BY MOUTH TWICE A DAY   amLODipine (NORVASC) 5 MG tablet Take 1 tablet (5 mg total) by mouth daily.   atorvastatin (LIPITOR) 20 MG tablet TAKE 1 TABLET BY MOUTH EVERY DAY   ELIQUIS 5 MG TABS tablet TAKE 1 TABLET BY MOUTH TWICE A DAY   FLUoxetine (PROZAC) 40 MG capsule TAKE 1 CAPSULE BY MOUTH EVERY DAY   furosemide (LASIX) 40 MG tablet TAKE 1 TABLET BY MOUTH EVERY DAY   metFORMIN (GLUCOPHAGE) 500 MG tablet TAKE 1 TABLET BY MOUTH TWICE DAILY WITH A MEAL   metoprolol (TOPROL-XL) 200 MG 24 hr tablet TAKE 1 TABLET BY MOUTH EVERY DAY   Multiple Vitamins-Minerals (MULTIVITAMIN MEN 50+) TABS Take 1 tablet by mouth daily.   olmesartan (BENICAR) 40 MG tablet TAKE 1 TABLET BY MOUTH EVERY DAY   polyethylene glycol (MIRALAX / GLYCOLAX) packet Take 17 g by mouth daily as needed for moderate constipation.   potassium chloride (KLOR-CON) 10 MEQ tablet TAKE 1 TABLET BY MOUTH EVERY DAY   spironolactone (ALDACTONE) 25 MG tablet TAKE 1 TABLET BY MOUTH EVERY DAY   No facility-administered encounter medications on file as of 04/28/2021.    Allergies (verified) Patient has no known allergies.   History: Past Medical History:  Diagnosis Date   Chronic systolic CHF (congestive heart failure) ([facility])    a. Echo (08/28/13): Mild LVH, EF 35-40%, diffuse HK, moderate to severe  LAE.   Diabetes mellitus without complication (HCC)    Diverticulitis    Headache(784.0)    Hyperlipidemia    Hypertension    Lymphoma, non Hodgkin's    sees Dr. [name]    Morbid obesity [facility])    NICM (nonischemic cardiomyopathy) ([place])    a. R/L Heart cath 08/30/13: RA mean 8, RV 30/0, PA 27/12, mean PCWP 9, CO 5.67, CI 1.98; normal coronary arteries   Sleep apnea    Past Surgical History:  Procedure Laterality Date   CARDIOVERSION N/A 06/28/2019   Procedure: CARDIOVERSION;  Surgeon: Lelon Perla, MD;  Location: [place];  Service: Cardiovascular;  Laterality: N/A;   HERNIA REPAIR     umblical   incarcerated hernia     vental 11/19/08 Dr. [name]   LEFT AND RIGHT HEART CATHETERIZATION WITH CORONARY ANGIOGRAM N/A 08/30/2013   Procedure: LEFT AND [place];  Surgeon: [name], MD;  Location: [facility] CATH LAB;  Service: Cardiovascular;  Laterality: N/A;   Family History  Problem Relation Age of Onset   Heart failure Mother    Hypertension Mother    Cancer Father        colon   Hypertension Father    Diabetes Father    Stroke Maternal Grandmother    Diabetes Paternal Grandfather    Cancer Maternal Grandfather    Social History   Socioeconomic History   Marital status: Married    Spouse name: Not on file   Number of children: Not on file   Years of education: Not on file   Highest education level: Not on file  Occupational History   Occupation: retired--postal Secretary/administrator: RETIRED  Tobacco Use   Smoking status: Former    Packs/day: 2.00    Years: 52.00    Pack years: 104.00    Types: Cigarettes    Start date: 08/27/1965    Quit date: 08/28/1999    Years since quitting: 21.6   Smokeless tobacco: Never   Tobacco comments:    quit 13 years ago  Vaping Use   Vaping Use: Never used  Substance and Sexual Activity   Alcohol use: Yes    Alcohol/week: 0.0 standard drinks    Comment: occ   Drug use: No   Sexual activity: Not on file  Other Topics Concern   Not on file  Social History Narrative   Lives with wife in two story home      Right handed      Highest level of edu- some college      Retired   Investment banker, operational of Radio broadcast assistant Strain: Not on file  Food Insecurity: Not on file  Transportation Needs: Not on file  Physical Activity: Not on file  Stress: Not on file  Social Connections: Not on file    Tobacco Counseling Counseling given: Not Answered Tobacco  comments: quit 13 years ago   Clinical Intake:                 Diabetic?yes Nutrition Risk Assessment:  Has the patient had any N/V/D within the last 2 months?  No  Does the patient have any non-healing wounds?  No  Has the patient had any unintentional weight loss or weight gain?  No   Diabetes:  Is the patient diabetic?  Yes  If diabetic, was a CBG obtained today?  No  Did the patient bring in their glucometer from home?  No  How often do you monitor your CBG's? never.   Financial Strains and Diabetes Management:  Are you having any financial strains with the device, your supplies or your medication? No .  Does the patient want to be seen by Chronic Care Management for management of their diabetes?  No  Would the patient like to be referred to a Nutritionist or for Diabetic Management?  No   Diabetic Exams:  Diabetic Eye Exam: Overdue for diabetic eye exam. Pt has been advised about the importance in completing this exam. Patient advised to call and schedule an eye exam. Diabetic Foot Exam: Overdue, Pt has been advised about the importance in completing this exam. Pt is scheduled for diabetic foot exam on next office visit .          Activities of Daily Living No flowsheet data found.  Patient Care Team: [name], MD as PCP - General Buford Dresser, MD as PCP - Cardiology (Cardiology)  Indicate any recent Medical Services you may have received from other than [facility] providers in the past year (date may be approximate).     Assessment:   This is a routine wellness examination for [place].  Hearing/Vision screen No results found.  Dietary issues and exercise activities discussed:     Goals Addressed   None    Depression Screen PHQ 2/9 Scores 02/24/2020 08/15/2018  PHQ - 2 Score 0 0    Fall Risk Fall Risk  02/24/2020 04/26/2019 08/15/2018  Falls in the past year? 1 1 No  Number falls in past yr: 1 0 -  Injury with Fall? 1 1 -  Comment  - Injured knee- not broken -  Risk for fall due to : History of fall(s) - -  Follow up Falls evaluation completed Falls evaluation completed -    FALL RISK PREVENTION PERTAINING TO THE HOME:  Any stairs in or around the home? Yes  If so, are there any without handrails? No  Home free of loose throw rugs in walkways, pet beds, electrical cords, etc? Yes  Adequate lighting in your home to reduce risk of falls? Yes   ASSISTIVE DEVICES UTILIZED TO PREVENT FALLS:  Life alert? Yes  Use of a cane, walker or w/c? Yes  Grab bars in the bathroom? Yes  Shower chair or bench in shower? Yes  Elevated toilet seat or a handicapped toilet? Yes    Cognitive Function:Normal cognitive status assessed by direct observation by this Nurse Health Advisor. No abnormalities found.     Montreal Cognitive Assessment  07/02/2019 04/26/2019  Visuospatial/ Executive (0/5) 2 2  Naming (0/3) 3 3  Attention: Read list of digits (0/2) 2 2  Attention: Read list of letters (0/1) 0 0  Attention: Serial 7 subtraction starting at 100 (0/3) 0 0  Language: Repeat phrase (0/2) 1 1  Language : Fluency (0/1) 0 0  Abstraction (0/2) 1 1  Delayed Recall (0/5) 0 0  Orientation (0/6) 3 3  Total 12 12      Immunizations Immunization History  Administered Date(s) Administered   Fluad Quad(high Dose 65+) 07/02/2019   Influenza Split 09/04/2013   Influenza, High Dose Seasonal PF 09/18/2014, 09/02/2016, 08/08/2017, 08/15/2018   Influenza-Unspecified 07/28/2015   Pneumococcal Conjugate-13 09/04/2013   Pneumococcal Polysaccharide-23 02/24/2016   Td 10/17/2002   Tdap 11/22/2016    TDAP status: Up to date  Flu Vaccine status: Up to date  Pneumococcal vaccine status: Up to date  Covid-19 vaccine status: Completed vaccines  Qualifies for Shingles Vaccine? Yes   Zostavax completed No   Shingrix Completed?: No.    Education has been provided regarding the importance of this vaccine. Patient has been advised to call  insurance company to determine out of pocket expense if they have not yet received this vaccine. Advised may also receive vaccine at local pharmacy or Health Dept. Verbalized acceptance and understanding.  Screening Tests Health Maintenance  Topic Date Due   FOOT EXAM  Never done   OPHTHALMOLOGY EXAM  Never done   Hepatitis C Screening  Never done   Zoster Vaccines- Shingrix (1 of 2) Never done   COLONOSCOPY (Pts 45-87yrs Insurance coverage will need to be confirmed)  Never done   HEMOGLOBIN A1C  05/03/2020   COVID-19 Vaccine (2 - Pfizer risk series) 09/08/2020   INFLUENZA VACCINE  05/17/2021   TETANUS/TDAP  11/22/2026   PNA vac Low Risk Adult  Completed   HPV VACCINES  Aged Out    Health Maintenance  Health Maintenance Due  Topic Date Due   FOOT EXAM  Never done   OPHTHALMOLOGY EXAM  Never done   Hepatitis C Screening  Never done   Zoster Vaccines- Shingrix (1 of 2) Never done   COLONOSCOPY (Pts 45-75yrs Insurance coverage will need to be confirmed)  Never done   HEMOGLOBIN A1C  05/03/2020   COVID-19 Vaccine (2 - Pfizer risk series) 09/08/2020    Colorectal cancer screening: Referral to GI placed 04/28/2021. Pt aware the office will call re: appt.  Lung Cancer Screening: (Low Dose CT Chest recommended if Age 75-80 years, 30 pack-year currently smoking OR have quit w/in 15years.) does not qualify.   Lung Cancer Screening Referral: n/a  Additional Screening:  Hepatitis C Screening: does qualify  Vision Screening: Recommended annual ophthalmology exams for early detection of glaucoma and other disorders of the eye. Is the patient up to date with their annual eye exam?  Yes  Who is the provider or what is the name of the office in which the patient attends annual eye exams? Referral completed 04/28/2021 If pt is not established with a provider, would they like to be referred to a provider to establish care? No .   Dental Screening: Recommended annual dental exams for proper  oral hygiene  Community Resource Referral / Chronic Care Management: CRR required this visit?  No   CCM required this visit?  No      Plan:     I have personally reviewed and noted the following in the patient's chart:   Medical and social history Use of alcohol, tobacco or illicit drugs  Current medications and supplements including opioid prescriptions. Patient is not currently taking opioid prescriptions. Functional ability and status Nutritional status Physical activity Advanced directives List of other physicians Hospitalizations, surgeries, and ER visits in previous 12 months Vitals Screenings to include cognitive, depression, and falls Referrals and appointments  In addition, I have reviewed and discussed with patient certain preventive protocols, quality metrics, and best practice recommendations. A written personalized care plan for preventive services as well as general preventive health recommendations were provided to patient.     Randel Pigg, LPN   [id number]   Nurse Notes: none

## 2021-05-04 ENCOUNTER — Ambulatory Visit: Payer: Medicare Other | Admitting: Family Medicine

## 2021-05-13 ENCOUNTER — Other Ambulatory Visit: Payer: Self-pay | Admitting: Family Medicine

## 2021-05-13 ENCOUNTER — Other Ambulatory Visit: Payer: Self-pay | Admitting: Cardiology

## 2021-05-13 NOTE — Telephone Encounter (Signed)
Rx(s) sent to pharmacy electronically.  

## 2021-05-27 ENCOUNTER — Ambulatory Visit (HOSPITAL_BASED_OUTPATIENT_CLINIC_OR_DEPARTMENT_OTHER): Payer: Medicare Other | Admitting: Cardiology

## 2021-05-27 NOTE — Progress Notes (Incomplete)
°Cardiology Office Note:   ° °Date:  05/27/2021  ° °ID:  [name], DOB 02/06/1946, MRN [id number] ° °PCP:  Fry, Stephen A, MD  °Cardiologist:  Bridgette Christopher, MD PhD ° °Referring MD: Fry, Stephen A, MD  ° °CC: follow up ° °History of Present Illness:   ° °[name] is a 75 y.o. male with a hx of atrial fibrillation, type II diabetes, hypertension, dyslipidemia, previously recovered systolic heart failure (EF 35-40% 2015 to 55-60% 2015) with most recent EF 45-50%. ° °I met him via video visit 05/03/19, please see notes. ° °Today: °Overall, ° °He denies any palpitations, chest pain, or shortness of breath. No lightheadedness, headaches, syncope, orthopnea, or PND. Also has no lower extremity edema or exertional symptoms. ° ° ° °Past Medical History:  °Diagnosis Date  ° Chronic systolic CHF (congestive heart failure) (HCC)   ° a. Echo (08/28/13): Mild LVH, EF 35-40%, diffuse HK, moderate to severe LAE.  ° Diabetes mellitus without complication (HCC)   ° Diverticulitis   ° Headache(784.0)   ° Hyperlipidemia   ° Hypertension   ° Lymphoma, non Hodgkin's   ° sees Dr. Murinson   ° Morbid obesity (HCC)   ° NICM (nonischemic cardiomyopathy) ([facility])   ° a. R/L Heart cath 08/30/13: RA mean 8, RV 30/0, PA 27/12, mean PCWP 9, CO 5.67, CI 1.98; normal coronary arteries  ° Sleep apnea   ° ° °Past Surgical History:  °Procedure Laterality Date  ° CARDIOVERSION N/A 06/28/2019  ° Procedure: CARDIOVERSION;  Surgeon: Crenshaw, Brian S, MD;  Location: [facility] ENDOSCOPY;  Service: Cardiovascular;  Laterality: N/A;  ° HERNIA REPAIR    ° umblical  ° incarcerated hernia    ° vental 11/19/08 Dr. Steven Gross  ° LEFT AND RIGHT HEART CATHETERIZATION WITH CORONARY ANGIOGRAM N/A 08/30/2013  ° Procedure: LEFT AND RIGHT HEART CATHETERIZATION WITH CORONARY ANGIOGRAM;  Surgeon: Peter C Nishan, MD;  Location: [facility] CATH LAB;  Service: Cardiovascular;  Laterality: N/A;  ° ° °Current Medications: °Current Outpatient Medications on File Prior to  Visit  °Medication Sig  ° ALPRAZolam (XANAX) 0.5 MG tablet Take 1 tablet (0.5 mg total) by mouth 3 (three) times daily as needed for anxiety. (Patient not taking: Reported on 04/28/2021)  ° amiodarone (PACERONE) 200 MG tablet TAKE 1 TABLET BY MOUTH TWICE A DAY  ° amLODipine (NORVASC) 5 MG tablet Take 1 tablet (5 mg total) by mouth daily.  ° atorvastatin (LIPITOR) 20 MG tablet TAKE 1 TABLET BY MOUTH EVERY DAY  ° ELIQUIS 5 MG TABS tablet TAKE 1 TABLET BY MOUTH TWICE A DAY  ° FLUoxetine (PROZAC) 40 MG capsule TAKE 1 CAPSULE BY MOUTH EVERY DAY  ° furosemide (LASIX) 40 MG tablet TAKE 1 TABLET BY MOUTH EVERY DAY  ° metFORMIN (GLUCOPHAGE) 500 MG tablet TAKE 1 TABLET BY MOUTH TWICE DAILY WITH A MEAL  ° metoprolol (TOPROL-XL) 200 MG 24 hr tablet TAKE 1 TABLET BY MOUTH EVERY DAY  ° Multiple Vitamins-Minerals (MULTIVITAMIN MEN 50+) TABS Take 1 tablet by mouth daily.  ° olmesartan (BENICAR) 40 MG tablet TAKE 1 TABLET BY MOUTH EVERY DAY (Patient not taking: Reported on 04/28/2021)  ° polyethylene glycol (MIRALAX / GLYCOLAX) packet Take 17 g by mouth daily as needed for moderate constipation. (Patient not taking: Reported on 04/28/2021)  ° potassium chloride (KLOR-CON) 10 MEQ tablet TAKE 1 TABLET BY MOUTH EVERY DAY (Patient not taking: Reported on 04/28/2021)  ° spironolactone (ALDACTONE) 25 MG tablet TAKE 1 TABLET BY MOUTH EVERY DAY  ° °  DAY   No current facility-administered medications on file prior to visit.     Allergies:   Patient has no known allergies.   Social History   Tobacco Use   Smoking status: Former    Packs/day: 2.00    Years: 52.00    Pack years: 104.00    Types: Cigarettes    Start date: 08/27/1965    Quit date: 08/28/1999    Years since quitting: 21.7   Smokeless tobacco: Never   Tobacco comments:    quit 13 years ago  Vaping Use   Vaping Use: Never used  Substance Use Topics   Alcohol use: Yes    Alcohol/week: 0.0 standard drinks    Comment: occ   Drug use: No    Family History: The  patient's family history includes Cancer in his father and maternal grandfather; Diabetes in his father and paternal grandfather; Heart failure in his mother; Hypertension in his father and mother; Stroke in his maternal grandmother.  ROS:   Please see the history of present illness.   (+) Additional pertinent ROS negative except as noted.   EKGs/Labs/Other Studies Reviewed:    The following studies were reviewed today:  Echo 04/08/19  1. The left ventricle has mildly reduced systolic function, with an ejection fraction of 45-50%. The cavity size was normal. There is moderately increased left ventricular wall thickness. Left ventricular diastolic function could not be evaluated  secondary to atrial fibrillation. Left ventricular diffuse hypokinesis.  2. Left atrial size was severely dilated.  3. Right atrial size was mildly dilated.  4. The mitral valve is abnormal. Mild thickening of the mitral valve leaflet.  5. The aortic valve is tricuspid. Mild thickening of the aortic valve. Aortic valve regurgitation is trivial by color flow Doppler.  6. There is mild dilatation of the aortic root measuring 43 mm.  7. Extremely limited; definity used; EF difficult to quantitate but appears mildly reduced; moderate LVH; mildly dilated aortic root; trace AI; biatrial enlargement.  Cath 08/30/2013 Procedural Findings: Hemodynamics RA Mean 8 RV 30/0 PA  27/12 PCWP Mean 9 LV  186/101 AO  177/106   Oxygen saturations: PA 61 AO 94   Cardiac Output (Fick) 5.67  Cardiac Index (Fick) 1.98          Coronary angiography: Coronary dominance: right   Left mainstem: Normal   Left anterior descending (LAD): Normal             D1:normal             D2: normal             IM: normal   Left circumflex (LCx): normal             OM1: small and normal             OM2 : very large branching artery normal   Right coronary artery (RCA):  Dominant and normal  EKG:  EKG is personally reviewed.    05/27/2021: *** 07/14/2020: sinus bradycardia, first degree AV block, IVCD  Recent Labs: No results found for requested labs within last 8760 hours.  Recent Lipid Panel    Component Value Date/Time   CHOL 249 (H) 02/24/2016 0930   TRIG 228.0 (H) 02/24/2016 0930   HDL 47.10 02/24/2016 0930   CHOLHDL 5 02/24/2016 0930   VLDL 45.6 (H) 02/24/2016 0930   LDLCALC 113 (H) 08/28/2013 0032   LDLDIRECT 180.0 02/24/2016 0930    Physical Exam:    VS:  There were no vitals taken for this visit.    Wt Readings from Last 3 Encounters:  07/14/20 (!) 329 lb (149.2 kg)  04/17/20 (!) 338 lb 9.6 oz (153.6 kg)  02/24/20 (!) 339 lb 3.2 oz (153.9 kg)    GEN: Well nourished, well developed in no acute distress HEENT: Normal, moist mucous membranes NECK: No JVD CARDIAC: regular rhythm, normal S1 and S2, no rubs or gallops. No murmur. VASCULAR: Radial and DP pulses 2+ bilaterally. No carotid bruits RESPIRATORY:  Clear to auscultation without rales, wheezing or rhonchi  ABDOMEN: Soft, non-tender, non-distended MUSCULOSKELETAL:  Ambulates independently SKIN: Warm and dry. Bilateral venous stasis changes, compression garments in place. Healed medial R knee wound.*** NEUROLOGIC:  Alert and oriented x 3. No focal neuro deficits noted. PSYCHIATRIC:  Normal affect   ASSESSMENT:    No diagnosis found. PLAN:    Atrial fibrillation: paroxysmal CHA2DS2/VAS Stroke Risk= 4  -has remained in SR since cardioversion 06/28/19 -continue amiodarone 200 mg BID. If weight loss continues, consider drop back to daily dose -Recheck LFTs, TFTs with next labs -rate controlled on metoprolol -continue apixaban 5 mg BID   Chronic systolic and diastolic dysfunction: diffuse hypokinesis on echo, EF 45-50%. Chronic LE edema and dyspnea on exertion. Nonischemic cardiomyopathy based on cath 2014 at time of diagnosis. -improving symptoms with weight loss -continued heart failure education. Wife is primary caretaker -on  metoprolol succinate 200 mg. Pulse today 57. Continue to monitor -reports he is back on olmesarta -continue chronic diuresis with furosemide 40 mg BID with K supplement. On spironolactone as well.  Chronic LE edema: suspect combination of chronic LV dysfunction/heart failure and chronic venous insufficiency -continue compression, elevation   Hypertension: at goal today -with continued weight loss, may need to scale back on medications in the future. Would drop amlodipine first given chronic edema.  Cardiac risk counseling and prevention recommendations: -recommend heart healthy/Mediterranean diet, with whole grains, fruits, vegetable, fish, lean meats, nuts, and olive oil. Limit salt. -recommend moderate walking, 3-5 times/week for 30-50 minutes each session. Aim for at least 150 minutes.week. Goal should be pace of 3 miles/hours, or walking 1.5 miles in 30 minutes -recommend avoidance of tobacco products. Avoid excess alcohol.  Plan for follow up: 6 mos or sooner PRN***  Medication Adjustments/Labs and Tests Ordered: Current medicines are reviewed at length with the patient today.  Concerns regarding medicines are outlined above.   No orders of the defined types were placed in this encounter.  No orders of the defined types were placed in this encounter.  There are no Patient Instructions on file for this visit.   I,Mathew Stumpf,acting as a Education administrator for [employer], MD.,have documented all relevant documentation on the behalf of Buford Dresser, MD,as directed by  Buford Dresser, MD while in the presence of Buford Dresser, MD.  ***  Signed, Buford Dresser, MD PhD 05/27/2021  [place]

## 2021-06-14 ENCOUNTER — Ambulatory Visit (HOSPITAL_BASED_OUTPATIENT_CLINIC_OR_DEPARTMENT_OTHER): Payer: Medicare Other | Admitting: Cardiology

## 2021-06-17 ENCOUNTER — Other Ambulatory Visit: Payer: Self-pay | Admitting: Family Medicine

## 2021-06-17 NOTE — Telephone Encounter (Signed)
Left detailed message for pt to call the office and schedule a f/u visit with Dr [name] for his lasix renewal

## 2021-06-18 ENCOUNTER — Other Ambulatory Visit: Payer: Self-pay | Admitting: Family Medicine

## 2021-07-05 ENCOUNTER — Other Ambulatory Visit: Payer: Self-pay | Admitting: Family Medicine

## 2021-07-12 ENCOUNTER — Encounter: Payer: Self-pay | Admitting: Family Medicine

## 2021-07-13 NOTE — Telephone Encounter (Signed)
Noted  

## 2021-07-14 ENCOUNTER — Encounter: Payer: Self-pay | Admitting: Family Medicine

## 2021-07-14 ENCOUNTER — Other Ambulatory Visit: Payer: Self-pay

## 2021-07-14 ENCOUNTER — Ambulatory Visit (INDEPENDENT_AMBULATORY_CARE_PROVIDER_SITE_OTHER): Payer: Medicare Other | Admitting: Family Medicine

## 2021-07-14 ENCOUNTER — Other Ambulatory Visit: Payer: Self-pay | Admitting: Family Medicine

## 2021-07-14 VITALS — BP 118/70 | HR 54 | Temp 98.5°F | Ht 76.0 in | Wt 272.0 lb

## 2021-07-14 DIAGNOSIS — N401 Enlarged prostate with lower urinary tract symptoms: Secondary | ICD-10-CM | POA: Diagnosis not present

## 2021-07-14 DIAGNOSIS — I5022 Chronic systolic (congestive) heart failure: Secondary | ICD-10-CM

## 2021-07-14 DIAGNOSIS — I10 Essential (primary) hypertension: Secondary | ICD-10-CM

## 2021-07-14 DIAGNOSIS — F0391 Unspecified dementia with behavioral disturbance: Secondary | ICD-10-CM

## 2021-07-14 DIAGNOSIS — R609 Edema, unspecified: Secondary | ICD-10-CM

## 2021-07-14 DIAGNOSIS — E538 Deficiency of other specified B group vitamins: Secondary | ICD-10-CM | POA: Diagnosis not present

## 2021-07-14 DIAGNOSIS — F411 Generalized anxiety disorder: Secondary | ICD-10-CM | POA: Diagnosis not present

## 2021-07-14 DIAGNOSIS — R410 Disorientation, unspecified: Secondary | ICD-10-CM | POA: Diagnosis not present

## 2021-07-14 DIAGNOSIS — Z23 Encounter for immunization: Secondary | ICD-10-CM

## 2021-07-14 DIAGNOSIS — C8598 Non-Hodgkin lymphoma, unspecified, lymph nodes of multiple sites: Secondary | ICD-10-CM

## 2021-07-14 DIAGNOSIS — N138 Other obstructive and reflux uropathy: Secondary | ICD-10-CM

## 2021-07-14 DIAGNOSIS — Z1211 Encounter for screening for malignant neoplasm of colon: Secondary | ICD-10-CM

## 2021-07-14 DIAGNOSIS — E785 Hyperlipidemia, unspecified: Secondary | ICD-10-CM

## 2021-07-14 DIAGNOSIS — E119 Type 2 diabetes mellitus without complications: Secondary | ICD-10-CM

## 2021-07-14 DIAGNOSIS — Z1212 Encounter for screening for malignant neoplasm of rectum: Secondary | ICD-10-CM

## 2021-07-14 DIAGNOSIS — I480 Paroxysmal atrial fibrillation: Secondary | ICD-10-CM

## 2021-07-14 LAB — URINALYSIS, ROUTINE W REFLEX MICROSCOPIC
Bilirubin Urine: NEGATIVE
Hgb urine dipstick: NEGATIVE
Ketones, ur: NEGATIVE
Leukocytes,Ua: NEGATIVE
Nitrite: NEGATIVE
Specific Gravity, Urine: 1.02 (ref 1.000–1.030)
Total Protein, Urine: 30 — AB
Urine Glucose: NEGATIVE
Urobilinogen, UA: 0.2 (ref 0.0–1.0)
pH: 6 (ref 5.0–8.0)

## 2021-07-14 LAB — HEPATIC FUNCTION PANEL
ALT: 22 U/L (ref 0–53)
AST: 20 U/L (ref 0–37)
Albumin: 3.6 g/dL (ref 3.5–5.2)
Alkaline Phosphatase: 85 U/L (ref 39–117)
Bilirubin, Direct: 0.1 mg/dL (ref 0.0–0.3)
Total Bilirubin: 0.5 mg/dL (ref 0.2–1.2)
Total Protein: 6.1 g/dL (ref 6.0–8.3)

## 2021-07-14 LAB — CBC WITH DIFFERENTIAL/PLATELET
Basophils Absolute: 0 10*3/uL (ref 0.0–0.1)
Basophils Relative: 0.5 % (ref 0.0–3.0)
Eosinophils Absolute: 0.1 10*3/uL (ref 0.0–0.7)
Eosinophils Relative: 1.7 % (ref 0.0–5.0)
HCT: 40.2 % (ref 39.0–52.0)
Hemoglobin: 13.4 g/dL (ref 13.0–17.0)
Lymphocytes Relative: 18.4 % (ref 12.0–46.0)
Lymphs Abs: 1.4 10*3/uL (ref 0.7–4.0)
MCHC: 33.2 g/dL (ref 30.0–36.0)
MCV: 90.5 fl (ref 78.0–100.0)
Monocytes Absolute: 0.6 10*3/uL (ref 0.1–1.0)
Monocytes Relative: 7.4 % (ref 3.0–12.0)
Neutro Abs: 5.4 10*3/uL (ref 1.4–7.7)
Neutrophils Relative %: 72 % (ref 43.0–77.0)
Platelets: 246 10*3/uL (ref 150.0–400.0)
RBC: 4.44 Mil/uL (ref 4.22–5.81)
RDW: 14.3 % (ref 11.5–15.5)
WBC: 7.5 10*3/uL (ref 4.0–10.5)

## 2021-07-14 LAB — LIPID PANEL
Cholesterol: 133 mg/dL (ref 0–200)
HDL: 49.8 mg/dL (ref >39.00)
LDL Cholesterol: 64 mg/dL (ref 0–99)
NonHDL: 83.61
Total CHOL/HDL Ratio: 3
Triglycerides: 98 mg/dL (ref 0.0–149.0)
VLDL: 19.6 mg/dL (ref 0.0–40.0)

## 2021-07-14 LAB — VITAMIN B12: Vitamin B-12: 234 pg/mL (ref 211–911)

## 2021-07-14 LAB — BASIC METABOLIC PANEL
BUN: 21 mg/dL (ref 6–23)
CO2: 29 mEq/L (ref 19–32)
Calcium: 9.1 mg/dL (ref 8.4–10.5)
Chloride: 102 mEq/L (ref 96–112)
Creatinine, Ser: 1.41 mg/dL (ref 0.40–1.50)
GFR: 48.86 mL/min — ABNORMAL LOW (ref >60.00)
Glucose, Bld: 83 mg/dL (ref 70–99)
Potassium: 4.2 mEq/L (ref 3.5–5.1)
Sodium: 139 mEq/L (ref 135–145)

## 2021-07-14 LAB — TSH: TSH: 3.25 u[IU]/mL (ref 0.35–5.50)

## 2021-07-14 LAB — PSA: PSA: 0.1 ng/mL (ref 0.10–4.00)

## 2021-07-14 LAB — HEMOGLOBIN A1C: Hgb A1c MFr Bld: 5.4 % (ref 4.6–6.5)

## 2021-07-14 NOTE — Progress Notes (Signed)
Subjective:    Patient ID: [name], male    DOB: [birth year], 75 y.o.   MRN: [id number]  HPI Here with his wife to follow up on issues. His paroxysmal atrial fibrillation and CHF have been stable, and he sees Dr. Buford Dresser regularly for these. He does not check his glucose. He has lost 57 lbs from when we saw him a year ago, and he says he has not been trying to lose weight. His wife admits he does not snack as much as he used to. I noticed a lot of scarring on both forearms, and when I asked about it he says that he constantly get "insects" with 4 legs and brown shells that burrow into his skin. He then digs them out with his fingernails. Of note he has had intermittent hallucinations over the past few years (per his wife) such as seeing or talking to people that are not present. She says he often gets confused, and even he admits that his memory has deteriorated. His moods have been stable and he sleeps well. His leg edema is no longer a problem.    Review of Systems  Constitutional:  Positive for unexpected weight change.  HENT: Negative.    Eyes: Negative.   Respiratory: Negative.    Cardiovascular: Negative.   Gastrointestinal: Negative.   Genitourinary: Negative.   Musculoskeletal: Negative.   Skin: Negative.   Neurological: Negative.   Psychiatric/Behavioral:  Positive for behavioral problems, confusion and hallucinations. Negative for agitation, dysphoric mood, sleep disturbance and suicidal ideas. The patient is not nervous/anxious.       Objective:   Physical Exam Constitutional:      General: He is not in acute distress.    Appearance: Normal appearance. He is well-developed. He is not diaphoretic.     Comments: He walks with a cane   HENT:     Head: Normocephalic and atraumatic.     Right Ear: External ear normal.     Left Ear: External ear normal.     Nose: Nose normal.     Mouth/Throat:     Pharynx: No oropharyngeal exudate.  Eyes:     General:  No scleral icterus.       Right eye: No discharge.        Left eye: No discharge.     Conjunctiva/sclera: Conjunctivae normal.     Pupils: Pupils are equal, round, and reactive to light.  Neck:     Thyroid: No thyromegaly.     Vascular: No JVD.     Trachea: No tracheal deviation.  Cardiovascular:     Rate and Rhythm: Normal rate and regular rhythm.     Heart sounds: Normal heart sounds. No murmur heard.   No friction rub. No gallop.  Pulmonary:     Effort: Pulmonary effort is normal. No respiratory distress.     Breath sounds: Normal breath sounds. No wheezing or rales.  Chest:     Chest wall: No tenderness.  Abdominal:     General: Bowel sounds are normal. There is no distension.     Palpations: Abdomen is soft. There is no mass.     Tenderness: There is no abdominal tenderness. There is no guarding or rebound.  Genitourinary:    Penis: Normal. No tenderness.      Testes: Normal.     Prostate: Normal.     Rectum: Normal. Guaiac result negative.  Musculoskeletal:        General: No tenderness. Normal range  of motion.     Cervical back: Neck supple.     Right lower leg: No edema.     Left lower leg: No edema.  Lymphadenopathy:     Cervical: No cervical adenopathy.  Skin:    General: Skin is warm and dry.     Coloration: Skin is not pale.     Findings: No erythema or rash.  Neurological:     General: No focal deficit present.     Mental Status: He is alert and oriented to person, place, and time.     Cranial Nerves: No cranial nerve deficit.     Motor: No abnormal muscle tone.     Coordination: Coordination normal.     Deep Tendon Reflexes: Reflexes are normal and symmetric. Reflexes normal.  Psychiatric:        Behavior: Behavior normal.        Thought Content: Thought content normal.        Judgment: Judgment normal.          Assessment & Plan:  He seems to  be doing well as far as HTN, CHF, leg edema, and atrial fibrillation. We will get fasting labs today to  check an A1c, etc so we can follow his diabetes. He refuses to ever get a colonoscopy, but he did agree to a Cologuard test, so we will arrange for this. His weight loss is concerning, so we will consider getting a CXR soon. His memory has gotten worse and he is having tactile and visual hallucinations. He may be showing a dementia. We will refer him to Neurology to evaluate further. We spent a total of ( 39  ) minutes reviewing records and discussing these issues.  Alysia Penna, MD

## 2021-08-09 ENCOUNTER — Ambulatory Visit: Payer: Medicare Other | Admitting: Physician Assistant

## 2021-08-16 NOTE — Progress Notes (Deleted)
Assessment/Plan:    Dementia  [facility] today is    Recommendations:  Discussed safety both in and out of the home.  Discussed the importance of regular daily schedule with inclusion of crossword puzzles to maintain brain function.  Continue to monitor mood by PCP Stay active at least 30 minutes at least 3 times a week.  Naps should be scheduled and should be no longer than 60 minutes and should not occur after 2 PM.  Mediterranean diet is recommended  Continue donepezil 10 mg daily Side effects were discussed Follow up in   months.   Case discussed with Dr. Delice Lesch who agrees with the plan     Subjective:   ED visits since last seen: none  Hospital admissions: none  [name] is a 75 y.o. male  seen today in follow up for memory loss. This patient is accompanied in the office by his who supplements the history.  Previous records as well as any outside records available were reviewed prior to todays visit.  Patient apparetly had a reaction to donepezil and has not tried [employer] yet,     Memory Repeats same stories and asks same questions Does not know what came to the room for Leaving objects  Drive Lives with Mood Depression Irritability CW puzzles Word Finding Board Games Painting Coloring Sleeps Vivid Dreams Sleepwalking Hallucinations Paranoia Hygiene concerns Bathing Dressing Medications pillbox Finances  Appetite  trouble swallowing.  Cooks.  stove on or the faucet on. Ambulates   Falls Head injuries Drive with GPS denies getting lost.   Denies headaches, double vision, dizziness, focal numbness or tingling, unilateral weakness or tremors or anosmia. No history of seizures. Denies urine incontinence, retention, constipation or diarrhea.   History of Present Illness 04/26/19 (video):  This is a pleasant 75 year old right-handed man with a history of hypertension, hyperlipidemia, diabetes,CHF, NICM, atrial fibrillation, non-Hodgkin's  lymphoma, presenting for evaluation of memory loss and hallucinations. He states his memory is poor. His wife and daughter report that he has always had memory issues, but the past couple of years they started to notice a different amount of memory loss. In the past month, he has had a steep decline in short-term memory with noticeable confusion, as well as visual hallucinations. He stopped driving 2 years ago because he did not feel secure. His family started managing his medication over the past month, he would mess up his pillbox. He used to pay the bills but it made him too anxious and frustrated, he was not paying things that needed to be paid, so his wife took over the past year. His family started noticing sporadic visual hallucinations over the past year, increasing in frequency and almost constant the past month. He would see people and animals inside and outside the house. He does not think they are real but does think they are some kind of image. No auditory component. They have not been scary for him. His wife has noticed a little paranoia, but they report he has always had anxiety. Sleep has always been on and off through the years since he used to work night shift. The hallucinations do not wake him up from sleep, no wandering behavior. His father had trouble with memory in his 72s. No history of significant head injuries or alcohol use.   He has occasional headaches from sinus issues or aggravation. He denies any dizziness, diplopia, dysarthria/dysphagia, neck/back pain, focal numbness/tingling/weakness, bowel/bladder dysfunction, anosmia, or tremors. Mood is "okay for an  old guy." He fell last Sunday and hurt his right knee.    I personally reviewed head CT without contrast done 03/2019, no acute changes. There was mild diffuse atrophy, mild chronic microvascular disease  LAbs 07/14/21 TSH 3.35. B12 234  CBC and BMP normal A1C 5.4   PREVIOUS MEDICATIONS:   CURRENT MEDICATIONS:  Outpatient  Encounter Medications as of 08/17/2021  Medication Sig   amiodarone (PACERONE) 200 MG tablet TAKE 1 TABLET BY MOUTH TWICE A DAY   amLODipine (NORVASC) 5 MG tablet TAKE 1 TABLET (5 MG TOTAL) BY MOUTH DAILY.   atorvastatin (LIPITOR) 20 MG tablet TAKE 1 TABLET BY MOUTH EVERY DAY   ELIQUIS 5 MG TABS tablet TAKE 1 TABLET BY MOUTH TWICE A DAY   FLUoxetine (PROZAC) 40 MG capsule TAKE 1 CAPSULE BY MOUTH EVERY DAY   furosemide (LASIX) 40 MG tablet TAKE 1 TABLET BY MOUTH EVERY DAY   metFORMIN (GLUCOPHAGE) 500 MG tablet TAKE 1 TABLET BY MOUTH TWICE DAILY WITH A MEAL   metoprolol (TOPROL-XL) 200 MG 24 hr tablet TAKE 1 TABLET BY MOUTH EVERY DAY   Multiple Vitamins-Minerals (MULTIVITAMIN MEN 50+) TABS Take 1 tablet by mouth daily.   spironolactone (ALDACTONE) 25 MG tablet TAKE 1 TABLET BY MOUTH EVERY DAY   No facility-administered encounter medications on file as of 08/17/2021.     Objective:     PHYSICAL EXAMINATION:    VITALS:  There were no vitals filed for this visit.  GEN:  The patient appears stated age and is in NAD. HEENT:  Normocephalic, atraumatic.   Neurological examination:  General: NAD, well-groomed, appears stated age. Orientation: The patient is alert. Oriented to person, place and date Cranial nerves: There is good facial symmetry.The speech is fluent and clear. No aphasia or dysarthria. Fund of knowledge is appropriate. Recent and remote memory are impaired. Attention and concentration are reduced.  Able to name objects and repeat phrases.  Hearing is intact to conversational tone.    Sensation: Sensation is intact to light touch throughout Motor: Strength is at least antigravity x4. Tremors: none  DTR's 2/4 in [place] Cognitive Assessment  07/02/2019 04/26/2019  Visuospatial/ Executive (0/5) 2 2  Naming (0/3) 3 3  Attention: Read list of digits (0/2) 2 2  Attention: Read list of letters (0/1) 0 0  Attention: Serial 7 subtraction starting at 100 (0/3) 0 0   Language: Repeat phrase (0/2) 1 1  Language : Fluency (0/1) 0 0  Abstraction (0/2) 1 1  Delayed Recall (0/5) 0 0  Orientation (0/6) 3 3  Total 12 12   No flowsheet data found.  No flowsheet data found.     Movement examination: Tone: There is normal tone in the UE/LE Abnormal movements:  no tremor.  No myoclonus.  No asterixis.   Coordination:  There is no decremation with RAM's. Normal finger to nose  Gait and Station: The patient has no difficulty arising out of a deep-seated chair without the use of the hands. The patient's stride length is good.  Gait is cautious and narrow.        Total time spent on today's visit was minutes, including both face-to-face time and nonface-to-face time. Time included that spent on review of records (prior notes available to me/labs/imaging if pertinent), discussing treatment and goals, answering patient's questions and coordinating care.  Cc:  [name], MD Sharene Butters, PA-C

## 2021-08-17 ENCOUNTER — Ambulatory Visit: Payer: Medicare Other | Admitting: Physician Assistant

## 2021-08-18 ENCOUNTER — Other Ambulatory Visit: Payer: Self-pay | Admitting: Family Medicine

## 2021-08-21 ENCOUNTER — Other Ambulatory Visit: Payer: Self-pay | Admitting: Cardiology

## 2021-08-23 NOTE — Telephone Encounter (Signed)
Prescription refill request for Eliquis received. Indication:Afib Last office visit:Needs Visit Scr:1.4 Age: 75 Weight:123.4 kg  Prescription refilled

## 2021-09-17 ENCOUNTER — Other Ambulatory Visit: Payer: Self-pay | Admitting: Family Medicine

## 2021-09-19 ENCOUNTER — Other Ambulatory Visit: Payer: Self-pay | Admitting: Family Medicine

## 2021-10-03 ENCOUNTER — Other Ambulatory Visit: Payer: Self-pay | Admitting: Family Medicine

## 2021-10-07 ENCOUNTER — Other Ambulatory Visit: Payer: Self-pay | Admitting: Family Medicine

## 2021-10-27 LAB — COLOGUARD: COLOGUARD: POSITIVE — AB

## 2021-10-27 NOTE — Addendum Note (Signed)
Addended by: Alysia Penna A on: 10/27/2021 02:09 PM   Modules accepted: Orders

## 2021-11-02 ENCOUNTER — Encounter: Payer: Self-pay | Admitting: *Deleted

## 2021-11-02 ENCOUNTER — Encounter: Payer: Self-pay | Admitting: Gastroenterology

## 2021-11-02 ENCOUNTER — Telehealth: Payer: Self-pay | Admitting: Gastroenterology

## 2021-11-02 ENCOUNTER — Telehealth (INDEPENDENT_AMBULATORY_CARE_PROVIDER_SITE_OTHER): Payer: Medicare Other | Admitting: Gastroenterology

## 2021-11-02 DIAGNOSIS — R195 Other fecal abnormalities: Secondary | ICD-10-CM | POA: Diagnosis not present

## 2021-11-02 DIAGNOSIS — Z7901 Long term (current) use of anticoagulants: Secondary | ICD-10-CM

## 2021-11-02 MED ORDER — PLENVU 140 G PO SOLR
1.0000 | ORAL | 0 refills | Status: DC
Start: 2021-11-02 — End: 2021-11-26

## 2021-11-02 NOTE — Addendum Note (Signed)
Addended by: Larina Bras on: 11/02/2021 01:38 PM   Modules accepted: Orders

## 2021-11-02 NOTE — Patient Instructions (Signed)
Our office will be contacting you within the week to schedule colonoscopy and go over any appropriate instructions.  Please call your cardiologist to make an appointment with them in the meantime as we discussed.

## 2021-11-02 NOTE — Progress Notes (Addendum)
11/02/2021 [name] [id number] October 07, 1946   I connected with [name] on November 02, 2018 at 9:30 AM by video enabled telemedicine application and verified that I am speaking with the correct person.  The patient and the provider were at separate locations during the entire encounter.  I discussed the limitations of evaluation and management by telemedicine and the availability of in person appointments.  Patient expressed understanding and agreed to proceed.  The patient and the provider were at different locations during the entire visit.  The patient was at home and the provider was at [facility] gastroenterology office.  HISTORY OF PRESENT ILLNESS: This is a 76 year old male who is new to our office.  He has been referred here by his PCP, Dr. Sarajane [name], in order to discuss colonoscopy as he recently had a positive Cologuard study.  Patient has never had a colonoscopy in the past.  He opted for Cologuard as a screening tool and unfortunately results returned positive.  He says that he moves his bowels regularly without any issues.  He denies any rectal bleeding.  He is on Eliquis that is prescribed by his cardiologist, Dr. Harrell [name].  His last office visit with them was in September 2021.   Past Medical History:  Diagnosis Date   Chronic systolic CHF (congestive heart failure) ([place])    a. Echo (08/28/13): Mild LVH, EF 35-40%, diffuse HK, moderate to severe LAE.   Diabetes mellitus without complication (HCC)    Diverticulitis    Headache(784.0)    Hyperlipidemia    Hypertension    Lymphoma, non Hodgkin's    sees Dr. Ralene [name]    Morbid obesity [facility])    NICM (nonischemic cardiomyopathy) ([name])    a. R/L Heart cath 08/30/13: RA mean 8, RV 30/0, PA 27/12, mean PCWP 9, CO 5.67, CI 1.98; normal coronary arteries   Sleep apnea    Past Surgical History:  Procedure Laterality Date   CARDIOVERSION N/A 06/28/2019   Procedure: CARDIOVERSION;  Surgeon: [name] Perla, MD;   Location: [place];  Service: Cardiovascular;  Laterality: N/A;   HERNIA REPAIR     umblical   incarcerated hernia     vental 11/19/08 Dr. [name]   LEFT AND RIGHT HEART CATHETERIZATION WITH CORONARY ANGIOGRAM N/A 08/30/2013   Procedure: LEFT AND [place];  Surgeon: [name] Hector, MD;  Location: [facility] CATH LAB;  Service: Cardiovascular;  Laterality: N/A;    reports that he quit smoking about 22 years ago. His smoking use included cigarettes. He started smoking about 56 years ago. He has a 104.00 pack-year smoking history. He has never used smokeless tobacco. He reports current alcohol use. He reports that he does not use drugs. family history includes Cancer in his father and maternal grandfather; Diabetes in his father and paternal grandfather; Heart failure in his mother; Hypertension in his father and mother; Stroke in his maternal grandmother. No Known Allergies    Outpatient Encounter Medications as of 11/02/2021  Medication Sig   amiodarone (PACERONE) 200 MG tablet TAKE 1 TABLET BY MOUTH TWICE A DAY   amLODipine (NORVASC) 5 MG tablet TAKE 1 TABLET (5 MG TOTAL) BY MOUTH DAILY.   apixaban (ELIQUIS) 5 MG TABS tablet TAKE 1 TABLET BY MOUTH TWICE A DAY   atorvastatin (LIPITOR) 20 MG tablet TAKE 1 TABLET BY MOUTH EVERY DAY   FLUoxetine (PROZAC) 40 MG capsule TAKE 1 CAPSULE BY MOUTH EVERY DAY   furosemide (LASIX) 40 MG  tablet TAKE 1 TABLET BY MOUTH EVERY DAY   metFORMIN (GLUCOPHAGE) 500 MG tablet TAKE 1 TABLET BY MOUTH TWICE DAILY WITH A MEAL   metoprolol (TOPROL-XL) 200 MG 24 hr tablet TAKE 1 TABLET BY MOUTH EVERY DAY   Multiple Vitamins-Minerals (MULTIVITAMIN MEN 50+) TABS Take 1 tablet by mouth daily.   spironolactone (ALDACTONE) 25 MG tablet TAKE 1 TABLET BY MOUTH EVERY DAY   No facility-administered encounter medications on file as of 11/02/2021.     REVIEW OF SYSTEMS  : All other systems reviewed and negative except where noted in the  History of Present Illness.   PHYSICAL EXAM: There were no vitals taken for this visit.  Height, weight, BMI all appropriate as of office visit with PCP September 2022 General: Well developed white male in no acute distress.  A&O x 3.  ASSESSMENT AND PLAN: *Positive Cologuard study: Patient has never had a colonoscopy in the past.  Opted for Cologuard instead and returned positive.  Patient has no GI complaints.  Recent hemoglobin is normal.  Patient will be scheduled for colonoscopy with Dr. Tarri [name]. *Chronic anticoagulation with Eliquis:  Will hold Elquis for 2 days prior to endoscopic procedures - will instruct when and how to resume after procedure. Benefits and risks of procedure explained including risks of bleeding, perforation, infection, missed lesions, reactions to medications and possible need for hospitalization and surgery for complications. Additional rare but real risk of stroke or other vascular clotting events off Eliquis also explained and need to seek urgent help if any signs of these problems occur. Will communicate by phone or EMR with patient's  prescribing provider, Dr. Harrell [name], to confirm that holding Eliquis is reasonable in this case.     CC:  [name] Morale, MD

## 2021-11-02 NOTE — Telephone Encounter (Signed)
[facility] Pre-operative Risk Assessment     Request for surgical clearance:     Endoscopy Procedure  What type of surgery is being performed?     Colonoscopy  When is this surgery scheduled?     TBD  What type of clearance is required ?   Pharmacy  Are there any medications that need to be held prior to surgery and how long? Eliquis 48 hours  Practice name and name of physician performing surgery?      [facility]  What is your office phone and fax number?      Phone- [phone]  [phone]  Anesthesia type (None, local, MAC, general) ?       MAC

## 2021-11-02 NOTE — Telephone Encounter (Signed)
Primary Cardiologist:Bridgette [name], MD  Chart reviewed as part of pre-operative protocol coverage. Because of [name] past medical history and time since last visit, he/she will require a follow-up visit in order to better assess preoperative cardiovascular risk.  Pre-op covering staff: - Please schedule appointment and call patient to inform them. - Please contact requesting surgeon's office via preferred method (i.e, phone, fax) to inform them of need for appointment prior to surgery.  If applicable, this message will also be routed to pharmacy pool and/or primary cardiologist for input on holding anticoagulant/antiplatelet agent as requested below so that this information is available at time of patient's appointment.   [name] Pelton, NP  11/02/2021, 2:16 PM

## 2021-11-02 NOTE — Telephone Encounter (Signed)
Pt has appt 11/05/21 with Dr. Buford Dresser. Will forward notes to MD for upcoming appt. Will send FYI to requesting office pt has appt 11/05/21.

## 2021-11-02 NOTE — Telephone Encounter (Signed)
Patient with diagnosis of afib on Eliquis for anticoagulation.    Procedure: colonoscopy Date of procedure: TBD  CHA2DS2-VASc Score = 5  This indicates a 7.2% annual risk of stroke. The patient's score is based upon: CHF History: 1 HTN History: 1 Diabetes History: 1 Stroke History: 0 Vascular Disease History: 0 Age Score: 2 Gender Score: 0   CrCl 20mL/min using adjusted body weight Platelet count 246K  Per office protocol, patient can hold Eliquis for 2 days prior to procedure as requested.

## 2021-11-02 NOTE — Telephone Encounter (Signed)
Procedure now scheduled for 11/26/21.

## 2021-11-02 NOTE — Progress Notes (Signed)
Reviewed and agree with management plans. ? ?[name] Rosengrant L. Galilee Pierron, MD, MPH  ?

## 2021-11-02 NOTE — Progress Notes (Signed)
I attempted to reach patient to schedule colonoscopy but was unable to speak with him. Patient's wife, however did go ahead and schedule patient colonoscopy on 11/26/21 at 68 am with Dr [name]. I advised that I will create instructions and call patient back to go over those instructions and answer any questions.

## 2021-11-02 NOTE — Telephone Encounter (Signed)
Mr. [name], quale are scheduled for a virtual visit with your provider today.    Just as we do with appointments in the office, we must obtain your consent to participate.  Your consent will be active for this visit and any virtual visit you may have with one of our providers in the next 365 days.    If you have a MyChart account, I can also send a copy of this consent to you electronically.  All virtual visits are billed to your insurance company just like a traditional visit in the office.  As this is a virtual visit, video technology does not allow for your provider to perform a traditional examination.  This may limit your provider's ability to fully assess your condition.  If your provider identifies any concerns that need to be evaluated in person or the need to arrange testing such as labs, EKG, etc, we will make arrangements to do so.    Although advances in technology are sophisticated, we cannot ensure that it will always work on either your end or our end.  If the connection with a video visit is poor, we may have to switch to a telephone visit.  With either a video or telephone visit, we are not always able to ensure that we have a secure connection.   I need to obtain your verbal consent now.   Are you willing to proceed with your visit today?   DONOVON MICHELETTI has provided verbal consent on 11/02/2021 for a virtual visit (video or telephone).   Laban Emperor. Crit Obremski, PA-C 11/02/2021  9:57 AM

## 2021-11-05 ENCOUNTER — Other Ambulatory Visit: Payer: Self-pay

## 2021-11-05 ENCOUNTER — Encounter (HOSPITAL_BASED_OUTPATIENT_CLINIC_OR_DEPARTMENT_OTHER): Payer: Self-pay | Admitting: Cardiology

## 2021-11-05 ENCOUNTER — Ambulatory Visit (INDEPENDENT_AMBULATORY_CARE_PROVIDER_SITE_OTHER): Payer: Medicare Other | Admitting: Cardiology

## 2021-11-05 VITALS — BP 100/54 | HR 57 | Ht 76.0 in | Wt 270.0 lb

## 2021-11-05 DIAGNOSIS — I480 Paroxysmal atrial fibrillation: Secondary | ICD-10-CM

## 2021-11-05 DIAGNOSIS — I10 Essential (primary) hypertension: Secondary | ICD-10-CM

## 2021-11-05 DIAGNOSIS — I5042 Chronic combined systolic (congestive) and diastolic (congestive) heart failure: Secondary | ICD-10-CM

## 2021-11-05 DIAGNOSIS — I428 Other cardiomyopathies: Secondary | ICD-10-CM

## 2021-11-05 DIAGNOSIS — Z7189 Other specified counseling: Secondary | ICD-10-CM

## 2021-11-05 DIAGNOSIS — I872 Venous insufficiency (chronic) (peripheral): Secondary | ICD-10-CM

## 2021-11-05 MED ORDER — AMIODARONE HCL 200 MG PO TABS: 200.0000 mg | ORAL_TABLET | Freq: Every day | ORAL | 3 refills | Status: DC

## 2021-11-05 NOTE — Progress Notes (Signed)
Cardiology Office Note:    Date:  11/05/2021   ID:  ROLAND LIPKE, DOB [birth year], MRN [id number]  PCP:  [name], MD  Cardiologist:  Buford Dresser, MD PhD  Referring MD: [name], MD   CC: follow up  History of Present Illness:    [name] is a 76 y.o. male with a hx of atrial fibrillation, type II diabetes, hypertension, dyslipidemia, previously recovered systolic heart failure (EF 35-40% 2015 to 55-60% 2015) with most recent EF 45-50%.  I met him via video visit 05/03/19, please see notes.  Today: He is accompanied by his wife. Overall, he feels great. Since his last visit, he successfully lost a lot of weight. Currently he is 270 lbs, down from 329 lbs at his last visit. In 04/2019 he was 400 lbs. His appetite remains healthy, but he chooses to stick with his diet.   He continues to have issues with LE edema R>L, but not as severe as prior. Currently he is wearing compression socks.  In a few weeks he is scheduled for a colonoscopy.  He denies any palpitations, chest pain, or shortness of breath. No lightheadedness, headaches, syncope, orthopnea, PND, or exertional symptoms. No recent falls.   Past Medical History:  Diagnosis Date   Chronic systolic CHF (congestive heart failure) ([place])    a. Echo (08/28/13): Mild LVH, EF 35-40%, diffuse HK, moderate to severe LAE.   Diabetes mellitus without complication (HCC)    Diverticulitis    Headache(784.0)    Hyperlipidemia    Hypertension    Lymphoma, non Hodgkin's    sees Dr. [name]    Morbid obesity [facility])    NICM (nonischemic cardiomyopathy) ([place])    a. R/L Heart cath 08/30/13: RA mean 8, RV 30/0, PA 27/12, mean PCWP 9, CO 5.67, CI 1.98; normal coronary arteries   Sleep apnea     Past Surgical History:  Procedure Laterality Date   CARDIOVERSION N/A 06/28/2019   Procedure: CARDIOVERSION;  Surgeon: Lelon Perla, MD;  Location: [place];  Service: Cardiovascular;  Laterality: N/A;    HERNIA REPAIR     umblical   incarcerated hernia     vental 11/19/08 Dr. [name]   LEFT AND RIGHT HEART CATHETERIZATION WITH CORONARY ANGIOGRAM N/A 08/30/2013   Procedure: LEFT AND [place];  Surgeon: [name], MD;  Location: [facility] CATH LAB;  Service: Cardiovascular;  Laterality: N/A;    Current Medications: Current Outpatient Medications on File Prior to Visit  Medication Sig   apixaban (ELIQUIS) 5 MG TABS tablet TAKE 1 TABLET BY MOUTH TWICE A DAY   atorvastatin (LIPITOR) 20 MG tablet TAKE 1 TABLET BY MOUTH EVERY DAY   FLUoxetine (PROZAC) 40 MG capsule TAKE 1 CAPSULE BY MOUTH EVERY DAY   furosemide (LASIX) 40 MG tablet TAKE 1 TABLET BY MOUTH EVERY DAY   metFORMIN (GLUCOPHAGE) 500 MG tablet TAKE 1 TABLET BY MOUTH TWICE DAILY WITH A MEAL   metoprolol (TOPROL-XL) 200 MG 24 hr tablet TAKE 1 TABLET BY MOUTH EVERY DAY   Multiple Vitamins-Minerals (MULTIVITAMIN MEN 50+) TABS Take 1 tablet by mouth daily.   PEG-KCl-NaCl-NaSulf-Na Asc-C (PLENVU) 140 g SOLR Take 1 kit by mouth as directed. Use coupon: BIN: [id number] PNC: CNRX Group: [id number] ID: [id number]   spironolactone (ALDACTONE) 25 MG tablet TAKE 1 TABLET BY MOUTH EVERY DAY   No current facility-administered medications on file prior to visit.     Allergies:   Patient has  no known allergies.   Social History   Tobacco Use   Smoking status: Former    Packs/day: 2.00    Years: 52.00    Pack years: 104.00    Types: Cigarettes    Start date: 08/27/1965    Quit date: 08/28/1999    Years since quitting: 22.2   Smokeless tobacco: Never   Tobacco comments:    quit 13 years ago  Vaping Use   Vaping Use: Never used  Substance Use Topics   Alcohol use: Yes    Alcohol/week: 0.0 standard drinks    Comment: occ   Drug use: No    Family History: The patient's family history includes Cancer in his father and maternal grandfather; Diabetes in his father and paternal grandfather; Heart  failure in his mother; Hypertension in his father and mother; Stroke in his maternal grandmother.  ROS:   Please see the history of present illness.   (+) Bilateral LE edema R>L Additional pertinent ROS negative except as noted.   EKGs/Labs/Other Studies Reviewed:    The following studies were reviewed today:  Echo 04/08/19  1. The left ventricle has mildly reduced systolic function, with an ejection fraction of 45-50%. The cavity size was normal. There is moderately increased left ventricular wall thickness. Left ventricular diastolic function could not be evaluated  secondary to atrial fibrillation. Left ventricular diffuse hypokinesis.  2. Left atrial size was severely dilated.  3. Right atrial size was mildly dilated.  4. The mitral valve is abnormal. Mild thickening of the mitral valve leaflet.  5. The aortic valve is tricuspid. Mild thickening of the aortic valve. Aortic valve regurgitation is trivial by color flow Doppler.  6. There is mild dilatation of the aortic root measuring 43 mm.  7. Extremely limited; definity used; EF difficult to quantitate but appears mildly reduced; moderate LVH; mildly dilated aortic root; trace AI; biatrial enlargement.  Cath 08/30/2013 Procedural Findings: Hemodynamics RA Mean 8 RV 30/0 PA  27/12 PCWP Mean 9 LV  186/101 AO  177/106   Oxygen saturations: PA 61 AO 94   Cardiac Output (Fick) 5.67  Cardiac Index (Fick) 1.98          Coronary angiography: Coronary dominance: right   Left mainstem: Normal   Left anterior descending (LAD): Normal             D1:normal             D2: normal             IM: normal   Left circumflex (LCx): normal             OM1: small and normal             OM2 : very large branching artery normal   Right coronary artery (RCA):  Dominant and normal  EKG:  EKG is personally reviewed.   11/05/2021: sinus bradycardia with sinus arrhythmia, first degree AV block, IVBC at 57 bpm 07/14/2020: sinus  bradycardia, first degree AV block, IVCD  Recent Labs: 07/14/2021: ALT 22; BUN 21; Creatinine, Ser 1.41; Hemoglobin 13.4; Platelets 246.0; Potassium 4.2; Sodium 139; TSH 3.25   Recent Lipid Panel    Component Value Date/Time   CHOL 133 07/14/2021 1046   TRIG 98.0 07/14/2021 1046   HDL 49.80 07/14/2021 1046   CHOLHDL 3 07/14/2021 1046   VLDL 19.6 07/14/2021 1046   LDLCALC 64 07/14/2021 1046   LDLDIRECT 180.0 02/24/2016 0930    Physical Exam:    VS:  BP (!) 100/54 (BP Location: Right Arm, Patient Position: Sitting, Cuff Size: Normal)    Pulse (!) 57    Ht $R'6\' 4"'fr$  (1.93 m)    Wt 270 lb (122.5 kg)    BMI 32.87 kg/m     Wt Readings from Last 3 Encounters:  11/05/21 270 lb (122.5 kg)  07/14/21 272 lb (123.4 kg)  07/14/20 (!) 329 lb (149.2 kg)    GEN: Well nourished, well developed in no acute distress HEENT: Normal, moist mucous membranes NECK: No JVD CARDIAC: regular rhythm, normal S1 and S2, no rubs or gallops. No murmur. VASCULAR: Radial and DP pulses 2+ bilaterally. No carotid bruits RESPIRATORY:  Clear to auscultation without rales, wheezing or rhonchi  ABDOMEN: Soft, non-tender, non-distended MUSCULOSKELETAL:  Ambulates independently SKIN: Warm and dry. Bilateral venous stasis changes, compression garments in place. Healed medial R knee wound. NEUROLOGIC:  Alert and oriented x 3. No focal neuro deficits noted. PSYCHIATRIC:  Normal affect   ASSESSMENT:    1. Paroxysmal atrial fibrillation (HCC)   2. Chronic combined systolic and diastolic heart failure ([place])   3. NICM (nonischemic cardiomyopathy) ([place])   4. Essential hypertension   5. Chronic venous insufficiency   6. Cardiac risk counseling   7. Counseling on health promotion and disease prevention     PLAN:    Atrial fibrillation: paroxysmal CHA2DS2/VAS Stroke Risk= 4  -in SR today -given weight loss, will decrease from amiodarone 200 mg BID to 200 mg daily. -most recent labs reviewed -rate controlled on  metoprolol as well, can consider decreasing dose at follow up if weight loss continues and heart rate remains well controlled -continue apixaban 5 mg BID. Ok to hold peri-procedurally for colonoscopy.   Chronic systolic and diastolic dysfunction: diffuse hypokinesis on echo, EF 45-50%. Chronic LE edema and dyspnea on exertion. Nonischemic cardiomyopathy based on cath 2014 at time of diagnosis. -improving symptoms with weight loss -continued heart failure education. Wife is primary caretaker -on metoprolol succinate 200 mg. As above, will consider decreasing dose if weight loss continues -no longer on olmesartan, lasix now daily (was BID) -On spironolactone as well.  Chronic LE edema: improving significantly with weight loss -continue compression, elevation   Hypertension: borderline low today -stopping amlodipine today -instructed on how to check BP at home and send numbers via mychart  Cardiac risk counseling and prevention recommendations: -recommend heart healthy/Mediterranean diet, with whole grains, fruits, vegetable, fish, lean meats, nuts, and olive oil. Limit salt. -recommend moderate walking, 3-5 times/week for 30-50 minutes each session. Aim for at least 150 minutes.week. Goal should be pace of 3 miles/hours, or walking 1.5 miles in 30 minutes -recommend avoidance of tobacco products. Avoid excess alcohol.  Plan for follow up: 6 mos or sooner PRN  Medication Adjustments/Labs and Tests Ordered: Current medicines are reviewed at length with the patient today.  Concerns regarding medicines are outlined above.   Orders Placed This Encounter  Procedures   EKG 12-Lead   Meds ordered this encounter  Medications   amiodarone (PACERONE) 200 MG tablet    Sig: Take 1 tablet (200 mg total) by mouth daily.    Dispense:  90 tablet    Refill:  3    Patient will call when this needs filled (changing from twice a day to once a day)   Patient Instructions  Medication Instructions:   1.) Stop amlodipine. Check blood pressure at home, send me readings in a few weeks through mychart. 2.)We are decreasing the amiodarone from 200 mg  twice a day to 200 mg daily because of your weight loss. If you feel afib let me know.   *If you need a refill on your cardiac medications before your next appointment, please call your pharmacy*   Lab Work: None ordered today   Testing/Procedures: None ordered today   Follow-Up: At [facility], you and your health needs are our priority.  As part of our continuing mission to provide you with exceptional heart care, we have created designated Provider Care Teams.  These Care Teams include your primary Cardiologist (physician) and Advanced Practice Providers (APPs -  Physician Assistants and Nurse Practitioners) who all work together to provide you with the care you need, when you need it.  We recommend signing up for the patient portal called "MyChart".  Sign up information is provided on this After Visit Summary.  MyChart is used to connect with patients for Virtual Visits (Telemedicine).  Patients are able to view lab/test results, encounter notes, upcoming appointments, etc.  Non-urgent messages can be sent to your provider as well.   To learn more about what you can do with MyChart, go to [email or web address].    Your next appointment:   6 month(s)  The format for your next appointment:   In Person  Provider:   Buford Dresser, MD       [facility] Stump[facility],acting as a scribe for Buford Dresser, MD.,have documented all relevant documentation on the behalf of Buford Dresser, MD,as directed by  Buford Dresser, MD while in the presence of Buford Dresser, MD.  I, Buford Dresser, MD, have reviewed all documentation for this visit. The documentation on 11/05/21 for the exam, diagnosis, procedures, and orders are all accurate and complete.   Signed, Buford Dresser, MD PhD 11/05/2021   [place]

## 2021-11-05 NOTE — Patient Instructions (Signed)
Medication Instructions:  1.) Stop amlodipine. Check blood pressure at home, send me readings in a few weeks through mychart. 2.)We are decreasing the amiodarone from 200 mg twice a day to 200 mg daily because of your weight loss. If you feel afib let me know.   *If you need a refill on your cardiac medications before your next appointment, please call your pharmacy*   Lab Work: None ordered today   Testing/Procedures: None ordered today   Follow-Up: At [facility], you and your health needs are our priority.  As part of our continuing mission to provide you with exceptional heart care, we have created designated Provider Care Teams.  These Care Teams include your primary Cardiologist (physician) and Advanced Practice Providers (APPs -  Physician Assistants and Nurse Practitioners) who all work together to provide you with the care you need, when you need it.  We recommend signing up for the patient portal called "MyChart".  Sign up information is provided on this After Visit Summary.  MyChart is used to connect with patients for Virtual Visits (Telemedicine).  Patients are able to view lab/test results, encounter notes, upcoming appointments, etc.  Non-urgent messages can be sent to your provider as well.   To learn more about what you can do with MyChart, go to [email or web address].    Your next appointment:   6 month(s)  The format for your next appointment:   In Person  Provider:   Buford Dresser, MD

## 2021-11-09 ENCOUNTER — Telehealth: Payer: Self-pay

## 2021-11-09 NOTE — Telephone Encounter (Signed)
I see a note that the pt can hold Eliquis 2 days prior to procedure.  See below:

## 2021-11-09 NOTE — Telephone Encounter (Signed)
Zehr, Laban Emperor, PA-C Gastroenterology clearance tbd Reason for call   Conversation: clearance tbd (Newest Message First) November 09, 2021       1:37 PM Larina Bras, CMA routed this conversation to Me  Larina Bras, CMA to Loralie Champagne, PA-C      1:36 PM No worries. Looks like Adasyn Mcadams called and left him a message to call back. Your note has been responded to (11/02/21) by cards. :0)  Me to Zehr, Laban Emperor, PA-C     10:31 AM Note I see a note that the pt can hold Eliquis 2 days prior to procedure.  See below:      Me     10:23 AM Note Left message on machine to call back

## 2021-11-09 NOTE — Telephone Encounter (Signed)
Patient with diagnosis of afib on Eliquis for anticoagulation.     Procedure: colonoscopy I see a note that the pt can hold Eliquis 2 days prior to procedure.  See below:    Date of procedure: TBD   CHA2DS2-VASc Score = 5  This indicates a 7.2% annual risk of stroke. The patient's score is based upon: CHF History: 1 HTN History: 1 Diabetes History: 1 Stroke History: 0 Vascular Disease History: 0 Age Score: 2 Gender Score: 0   CrCl 73mL/min using adjusted body weight Platelet count 246K   Per office protocol, patient can hold Eliquis for 2 days prior to procedure as requested.

## 2021-11-09 NOTE — Telephone Encounter (Signed)
Left message on machine to call back  

## 2021-11-10 NOTE — Telephone Encounter (Signed)
The pt has been advised of the 2 day Eliquis hold.  The pt has been advised of the information and verbalized understanding.

## 2021-11-26 ENCOUNTER — Ambulatory Visit (AMBULATORY_SURGERY_CENTER): Payer: Medicare Other | Admitting: Gastroenterology

## 2021-11-26 ENCOUNTER — Encounter: Payer: Self-pay | Admitting: Gastroenterology

## 2021-11-26 VITALS — BP 118/73 | HR 65 | Temp 96.6°F | Resp 22 | Ht 76.0 in | Wt 270.0 lb

## 2021-11-26 DIAGNOSIS — D124 Benign neoplasm of descending colon: Secondary | ICD-10-CM

## 2021-11-26 DIAGNOSIS — Z1211 Encounter for screening for malignant neoplasm of colon: Secondary | ICD-10-CM

## 2021-11-26 DIAGNOSIS — D128 Benign neoplasm of rectum: Secondary | ICD-10-CM

## 2021-11-26 DIAGNOSIS — D122 Benign neoplasm of ascending colon: Secondary | ICD-10-CM

## 2021-11-26 DIAGNOSIS — D123 Benign neoplasm of transverse colon: Secondary | ICD-10-CM

## 2021-11-26 DIAGNOSIS — D125 Benign neoplasm of sigmoid colon: Secondary | ICD-10-CM

## 2021-11-26 DIAGNOSIS — R195 Other fecal abnormalities: Secondary | ICD-10-CM | POA: Diagnosis not present

## 2021-11-26 HISTORY — PX: COLONOSCOPY: SHX174

## 2021-11-26 MED ORDER — SODIUM CHLORIDE 0.9 % IV SOLN: 500.0000 mL | Freq: Once | INTRAVENOUS | Status: DC

## 2021-11-26 NOTE — Progress Notes (Signed)
Called to room to assist during endoscopic procedure.  Patient ID and intended procedure confirmed with present staff. Received instructions for my participation in the procedure from the performing physician.  

## 2021-11-26 NOTE — Op Note (Addendum)
[place] Patient Name: [name] Procedure Date: 11/26/2021 3:41 PM MRN: [id number] Endoscopist: Thornton Park MD, MD Age: 76 Referring MD:  Date of Birth: Oct 27, 1945 Gender: Male Account #: [id number] Procedure:                Colonoscopy Indications:              Positive Cologuard test Medicines:                Monitored Anesthesia Care Procedure:                Pre-Anesthesia Assessment:                           - Prior to the procedure, a History and Physical                            was performed, and patient medications and                            allergies were reviewed. The patient's tolerance of                            previous anesthesia was also reviewed. The risks                            and benefits of the procedure and the sedation                            options and risks were discussed with the patient.                            All questions were answered, and informed consent                            was obtained. Prior Anticoagulants: The patient has                            taken Eliquis (apixaban), last dose was 3 days                            prior to procedure. ASA Grade Assessment: III - A                            patient with severe systemic disease. After                            reviewing the risks and benefits, the patient was                            deemed in satisfactory condition to undergo the                            procedure.  After obtaining informed consent, the colonoscope                            was passed under direct vision. Throughout the                            procedure, the patient's blood pressure, pulse, and                            oxygen saturations were monitored continuously. The                            CF [id number] #[id number] was introduced through the anus                            and advanced to the the cecum, identified by                             appendiceal orifice and ileocecal valve. A second                            forward view of the right colon was performed. The                            colonoscopy was extremely difficult due to a                            redundant colon, significant looping and a tortuous                            colon. Successful completion of the procedure was                            aided by changing the patient's position,                            withdrawing and reinserting the scope and applying                            abdominal pressure. The patient tolerated the                            procedure well. The quality of the bowel                            preparation was adequate. The ileocecal valve,                            appendiceal orifice, and rectum were photographed. Scope In: 3:47:34 PM Scope Out: 4:40:05 PM Scope Withdrawal Time: 0 hours 31 minutes 47 seconds  Total Procedure Duration: 0 hours 52 minutes 31 seconds  Findings:                 The perianal and digital rectal  examinations were                            normal.                           Non-bleeding internal hemorrhoids were found.                           Eight sessile polyps were found in the rectum (1),                            sigmoid colon (1), descending colon (3), transverse                            colon (1) and ascending colon (2). The polyps were                            2 to 8 mm in size. These polyps were removed with a                            cold snare. Resection and retrieval were complete.                            Estimated blood loss was minimal.                           Multiple small and large-mouthed diverticula were                            found in the sigmoid colon and descending colon.                           The colon is tortuous and redundant. The exam was                            otherwise without abnormality on direct and                             retroflexion views. Complications:            No immediate complications. Estimated blood loss:                            Minimal. Estimated Blood Loss:     Estimated blood loss was minimal. Impression:               - Non-bleeding internal hemorrhoids.                           - Eight 2 to 8 mm polyps in the rectum, in the                            sigmoid colon, in the descending colon, in the  transverse colon and in the ascending colon,                            removed with a cold snare. Resected and retrieved.                           - The examination was otherwise normal on direct                            and retroflexion views. Recommendation:           - Patient has a contact number available for                            emergencies. The signs and symptoms of potential                            delayed complications were discussed with the                            patient. Return to normal activities tomorrow.                            Written discharge instructions were provided to the                            patient.                           - Resume previous diet. High fiber diet recommended.                           - Continue present medications.                           - Resume Eliquis tomorrow.                           - Await pathology results.                           - Repeat colonoscopy is not recommended due to                            current age (53 years or older) for surveillance.                           - Emerging evidence supports eating a diet of                            fruits, vegetables, grains, calcium, and yogurt                            while reducing red meat and alcohol may reduce the  risk of colon cancer. Thornton Park MD, MD 11/26/2021 4:49:41 PM This report has been signed electronically.

## 2021-11-26 NOTE — Patient Instructions (Signed)
RESUME ELIQUIS TOMORROW ( BLOOD THINNER)  HANDOUTS ON POLYPS ,DIVERTICULOSIS,HEMORRHOIDS & HIGH FIBER DIET GIVEN TO YOU TODAY  AWAIT PATHOLOGY RESULTS ON POLYPS REMOVED TODAY   YOU HAD AN ENDOSCOPIC PROCEDURE TODAY AT [place]:   Refer to the procedure report that was given to you for any specific questions about what was found during the examination.  If the procedure report does not answer your questions, please call your gastroenterologist to clarify.  If you requested that your care partner not be given the details of your procedure findings, then the procedure report has been included in a sealed envelope for you to review at your convenience later.  YOU SHOULD EXPECT: Some feelings of bloating in the abdomen. Passage of more gas than usual.  Walking can help get rid of the air that was put into your GI tract during the procedure and reduce the bloating. If you had a lower endoscopy (such as a colonoscopy or flexible sigmoidoscopy) you may notice spotting of blood in your stool or on the toilet paper. If you underwent a bowel prep for your procedure, you may not have a normal bowel movement for a few days.  Please Note:  You might notice some irritation and congestion in your nose or some drainage.  This is from the oxygen used during your procedure.  There is no need for concern and it should clear up in a day or so.  SYMPTOMS TO REPORT IMMEDIATELY:  Following lower endoscopy (colonoscopy or flexible sigmoidoscopy):  Excessive amounts of blood in the stool  Significant tenderness or worsening of abdominal pains  Swelling of the abdomen that is new, acute  Fever of 100F or higher   For urgent or emergent issues, a gastroenterologist can be reached at any hour by calling [phone]. Do not use MyChart messaging for urgent concerns.    DIET:  We do recommend a small meal at first, but then you may proceed to your regular diet.  Drink plenty of fluids but you  should avoid alcoholic beverages for 24 hours.  ACTIVITY:  You should plan to take it easy for the rest of today and you should NOT DRIVE or use heavy machinery until tomorrow (because of the sedation medicines used during the test).    FOLLOW UP: Our staff will call the number listed on your records 48-72 hours following your procedure to check on you and address any questions or concerns that you may have regarding the information given to you following your procedure. If we do not reach you, we will leave a message.  We will attempt to reach you two times.  During this call, we will ask if you have developed any symptoms of COVID 19. If you develop any symptoms (ie: fever, flu-like symptoms, shortness of breath, cough etc.) before then, please call [phone].  If you test positive for Covid 19 in the 2 weeks post procedure, please call and report this information to [place].    If any biopsies were taken you will be contacted by phone or by letter within the next 1-3 weeks.  Please call us at [phone] if you have not heard about the biopsies in 3 weeks.    SIGNATURES/CONFIDENTIALITY: You and/or your care partner have signed paperwork which will be entered into your electronic medical record.  These signatures attest to the fact that that the information above on your After Visit Summary has been reviewed and is understood.  Full responsibility of the  confidentiality of this discharge information lies with you and/or your care-partner.

## 2021-11-26 NOTE — Progress Notes (Signed)
Referring Provider: [name], MD Primary Care Physician:  [name], MD  Reason for Procedure:  Positive cologuard   IMPRESSION:  Need for colon cancer screening Appropriate candidate for monitored anesthesia care  PLAN: Colonoscopy in the [facility] today   HPI: [name] is a 76 y.o. male presents for  colonoscopy to evaluate a positive Cologuard.  Patient has never had a colonoscopy in the past.  He opted for Cologuard as a screening tool and unfortunately results returned positive.  He says that he moves his bowels regularly without any issues.  He denies any rectal bleeding.  He is on Eliquis that is prescribed by his cardiologist, Dr. [name].  His last office visit with them was in September 2021.  No baseline GI symptoms.   No known family history of colon cancer or polyps. No family history of uterine/endometrial cancer, pancreatic cancer or gastric/stomach cancer.   Past Medical History:  Diagnosis Date   Chronic systolic CHF (congestive heart failure) ([place])    a. Echo (08/28/13): Mild LVH, EF 35-40%, diffuse HK, moderate to severe LAE.   Diabetes mellitus without complication (HCC)    Diverticulitis    Headache(784.0)    Hyperlipidemia    Hypertension    Lymphoma, non Hodgkin's    sees Dr. [name]    Morbid obesity [facility])    NICM (nonischemic cardiomyopathy) ([place])    a. R/L Heart cath 08/30/13: RA mean 8, RV 30/0, PA 27/12, mean PCWP 9, CO 5.67, CI 1.98; normal coronary arteries   Sleep apnea     Past Surgical History:  Procedure Laterality Date   CARDIOVERSION N/A 06/28/2019   Procedure: CARDIOVERSION;  Surgeon: Lelon Perla, MD;  Location: [facility];  Service: Cardiovascular;  Laterality: N/A;   HERNIA REPAIR     umblical   incarcerated hernia     vental 11/19/08 Dr. [name]   LEFT AND RIGHT HEART CATHETERIZATION WITH CORONARY ANGIOGRAM N/A 08/30/2013   Procedure: LEFT AND [place];   Surgeon: [name], MD;  Location: [facility] CATH LAB;  Service: Cardiovascular;  Laterality: N/A;    Current Outpatient Medications  Medication Sig Dispense Refill   amiodarone (PACERONE) 200 MG tablet Take 1 tablet (200 mg total) by mouth daily. 90 tablet 3   apixaban (ELIQUIS) 5 MG TABS tablet TAKE 1 TABLET BY MOUTH TWICE A DAY 60 tablet 0   atorvastatin (LIPITOR) 20 MG tablet TAKE 1 TABLET BY MOUTH EVERY DAY 90 tablet 3   FLUoxetine (PROZAC) 40 MG capsule TAKE 1 CAPSULE BY MOUTH EVERY DAY 90 capsule 3   furosemide (LASIX) 40 MG tablet TAKE 1 TABLET BY MOUTH EVERY DAY 90 tablet 0   metFORMIN (GLUCOPHAGE) 500 MG tablet TAKE 1 TABLET BY MOUTH TWICE DAILY WITH A MEAL 180 tablet 0   metoprolol (TOPROL-XL) 200 MG 24 hr tablet TAKE 1 TABLET BY MOUTH EVERY DAY 90 tablet 3   Multiple Vitamins-Minerals (MULTIVITAMIN MEN 50+) TABS Take 1 tablet by mouth daily.     PEG-KCl-NaCl-NaSulf-Na Asc-C (PLENVU) 140 g SOLR Take 1 kit by mouth as directed. Use coupon: BIN: [id number] PNC: CNRX Group: [id number] ID: [id number] 1 each 0   spironolactone (ALDACTONE) 25 MG tablet TAKE 1 TABLET BY MOUTH EVERY DAY 90 tablet 0   No current facility-administered medications for this visit.    Allergies as of 11/26/2021   (No Known Allergies)    Family History  Problem Relation Age of Onset  Heart failure Mother    Hypertension Mother    Cancer Father        colon   Hypertension Father    Diabetes Father    Stroke Maternal Grandmother    Diabetes Paternal Grandfather    Cancer Maternal Grandfather      Physical Exam: General:   Alert,  well-nourished, pleasant and cooperative in NAD Head:  Normocephalic and atraumatic. Eyes:  Sclera clear, no icterus.   Conjunctiva pink. Mouth:  No deformity or lesions.   Neck:  Supple; no masses or thyromegaly. Lungs:  Clear throughout to auscultation.   No wheezes. Heart:  Regular rate and rhythm; no murmurs. Abdomen:  Soft, non-tender, nondistended, normal bowel  sounds, no rebound or guarding.  Msk:  Symmetrical. No boney deformities LAD: No inguinal or umbilical LAD Extremities:  No clubbing or edema. Neurologic:  Alert and  oriented x4;  grossly nonfocal Skin:  No obvious rash or bruise. Psych:  Alert and cooperative. Normal mood and affect.     Studies/Results: No results found.    Bhumi Godbey L. [name], MD, MPH 11/26/2021, 3:26 PM

## 2021-11-26 NOTE — Progress Notes (Signed)
To Pacu, VSS. Report to [email or web address] ?

## 2021-11-26 NOTE — Progress Notes (Signed)
Vitals-SH  History reviewed.   Patient's memory is not good. He couldn't tell me about any of his medications. His wife told me that he t"ook all of them yesterday."  Patient stated that he hadn't had a stool since last night.  He claimed that his stool was soft and in pieces.  I informed the wife that we would try per Dr. [name], but if we can't see that we woul;d have to reschedule.  The wife stated that she "couldn't do it again.'  Dr [name] stated that she would proceed with the procedure.

## 2021-11-30 ENCOUNTER — Telehealth: Payer: Self-pay

## 2021-11-30 NOTE — Telephone Encounter (Signed)
°  Follow up Call-  Call back number 11/26/2021  Post procedure Call Back phone  # [phone]  Permission to leave phone message Yes  Some recent data might be hidden     Left message

## 2021-12-07 ENCOUNTER — Encounter: Payer: Self-pay | Admitting: Gastroenterology

## 2021-12-26 ENCOUNTER — Other Ambulatory Visit: Payer: Self-pay | Admitting: Family Medicine

## 2022-01-02 ENCOUNTER — Other Ambulatory Visit: Payer: Self-pay | Admitting: Family Medicine

## 2022-01-03 ENCOUNTER — Other Ambulatory Visit: Payer: Self-pay | Admitting: Family Medicine

## 2022-03-15 ENCOUNTER — Telehealth: Payer: Self-pay

## 2022-03-15 MED ORDER — BLOOD GLUCOSE METER KIT: PACK | 0 refills | Status: DC

## 2022-03-15 NOTE — Telephone Encounter (Signed)
Our office received a fax from [facility] requesting a prescription for diabetic supplies, Order and progressive notes from [place] was faxed to [facility]

## 2022-04-02 ENCOUNTER — Other Ambulatory Visit: Payer: Self-pay | Admitting: Family Medicine

## 2022-04-03 ENCOUNTER — Other Ambulatory Visit: Payer: Self-pay | Admitting: Family Medicine

## 2022-04-17 ENCOUNTER — Other Ambulatory Visit: Payer: Self-pay | Admitting: Family Medicine

## 2022-04-26 ENCOUNTER — Ambulatory Visit: Payer: Medicare Other | Admitting: Family Medicine

## 2022-05-03 ENCOUNTER — Telehealth: Payer: Self-pay

## 2022-05-03 NOTE — Telephone Encounter (Signed)
Unsuccessful attempt to reach patient on preferred number listed in notes for scheduled AWV. Unable to leave message on voicemail. 

## 2022-05-03 NOTE — Progress Notes (Signed)
Subjective:   [name] is a 76 y.o. male who presents for Medicare Annual/Subsequent preventive examination.  Review of Systems    Virtual Visit via Telephone Note  I connected with  [name] on 05/04/22 at 11:30 AM EDT by telephone and verified that I am speaking with the correct person using two identifiers.  Location: Patient:  Provider:  Persons participating in the virtual visit: patient/Nurse Health Advisor   I discussed the limitations, risks, security and privacy concerns of performing an evaluation and management service by telephone and the availability of in person appointments. The patient expressed understanding and agreed to proceed.  Interactive audio and video telecommunications were attempted between this nurse and patient, however failed, due to patient having technical difficulties OR patient did not have access to video capability.  We continued and completed visit with audio only.  Some vital signs may be absent or patient reported.   [name] Peaches, LPN        Objective:    There were no vitals filed for this visit. There is no height or weight on file to calculate BMI.     04/28/2021   10:35 AM 06/28/2019    9:09 AM 04/07/2019   12:00 PM 02/04/2017   10:43 AM 09/27/2015    9:27 PM 08/27/2013    7:46 PM  Advanced Directives  Does Patient Have a Medical Advance Directive? Yes Yes Yes No No Patient does not have advance directive  Type of Advance Directive [place];Living will [place];Living will [place]     Does patient want to make changes to medical advance directive?   No - Guardian declined     Copy of [place] in Chart? No - copy requested No - copy requested      Would patient like information on creating a medical advance directive?     No - patient declined information   Pre-existing out of facility DNR order (yellow form or pink MOST form)       No    Current Medications (verified) Outpatient Encounter Medications as of 05/03/2022  Medication Sig   amiodarone (PACERONE) 200 MG tablet Take 1 tablet (200 mg total) by mouth daily.   apixaban (ELIQUIS) 5 MG TABS tablet TAKE 1 TABLET BY MOUTH TWICE A DAY   atorvastatin (LIPITOR) 20 MG tablet TAKE 1 TABLET BY MOUTH EVERY DAY   blood glucose meter kit and supplies Dispense based on patient and insurance preference. Use up to four times daily as directed. (FOR ICD-10 E10.9, E11.9).   FLUoxetine (PROZAC) 40 MG capsule TAKE 1 CAPSULE BY MOUTH EVERY DAY   furosemide (LASIX) 40 MG tablet TAKE 1 TABLET BY MOUTH EVERY DAY   metFORMIN (GLUCOPHAGE) 500 MG tablet TAKE 1 TABLET BY MOUTH TWICE DAILY WITH A MEAL   metoprolol (TOPROL-XL) 200 MG 24 hr tablet TAKE 1 TABLET BY MOUTH EVERY DAY   Multiple Vitamins-Minerals (MULTIVITAMIN MEN 50+) TABS Take 1 tablet by mouth daily.   spironolactone (ALDACTONE) 25 MG tablet TAKE 1 TABLET BY MOUTH EVERY DAY   No facility-administered encounter medications on file as of 05/03/2022.    Allergies (verified) Patient has no known allergies.   History: Past Medical History:  Diagnosis Date   Chronic systolic CHF (congestive heart failure) ([place])    a. Echo (08/28/13): Mild LVH, EF 35-40%, diffuse HK, moderate to severe LAE.   Diabetes mellitus without complication ([place])  Diverticulitis    Headache(784.0)    Hyperlipidemia    Hypertension    Lymphoma, non Hodgkin's    sees Dr. [name]    Morbid obesity [facility])    NICM (nonischemic cardiomyopathy) ([place])    a. R/L Heart cath 08/30/13: RA mean 8, RV 30/0, PA 27/12, mean PCWP 9, CO 5.67, CI 1.98; normal coronary arteries   Sleep apnea    Past Surgical History:  Procedure Laterality Date   CARDIOVERSION N/A 06/28/2019   Procedure: CARDIOVERSION;  Surgeon: Lelon Perla, MD;  Location: [facility];  Service: Cardiovascular;  Laterality: N/A;   COLONOSCOPY  11/26/2021   HERNIA REPAIR     umblical    incarcerated hernia     vental 11/19/08 Dr. [name]   LEFT AND RIGHT HEART CATHETERIZATION WITH CORONARY ANGIOGRAM N/A 08/30/2013   Procedure: LEFT AND RIGHT HEART CATHETERIZATION WITH CORONARY ANGIOGRAM;  Surgeon: [name], MD;  Location[facility] C[facility] LAB;  Service: Cardiovascular;  Laterality: N/A;   Family History  Problem Relation Age of Onset   Heart failure Mother    Hypertension Mother    Cancer Father        colon   Hypertension Father    Diabetes Father    Stroke Maternal Grandmother    Diabetes Paternal Grandfather    Cancer Maternal Grandfather    Social History   Socioeconomic History   Marital status: Married    Spouse name: Not on file   Number of children: Not on file   Years of education: Not on file   Highest education level: Not on file  Occupational History   Occupation: retired--postal Secretary/administrator: RETIRED  Tobacco Use   Smoking status: Former    Packs/day: 2.00    Years: 52.00    Total pack years: 104.00    Types: Cigarettes    Start date: 08/27/1965    Quit date: 08/28/1999    Years since quitting: 22.6   Smokeless tobacco: Never   Tobacco comments:    quit 13 years ago  Vaping Use   Vaping Use: Never used  Substance and Sexual Activity   Alcohol use: Yes    Alcohol/week: 0.0 standard drinks of alcohol    Comment: occ   Drug use: No   Sexual activity: Not on file  Other Topics Concern   Not on file  Social History Narrative   Lives with wife in two story home      Right handed      Highest level of edu- some college      Retired   Investment banker, operational of Radio broadcast assistant Strain: [place]  (04/28/2021)   Overall Financial Resource Strain (CARDIA)    Difficulty of Paying Living Expenses: Not hard at all  Food Insecurity: No [place] (04/28/2021)   Hunger Vital Sign    Worried About Running Out of Food in the Last Year: Never true    [place] in the Last Year: Never true  Transportation Needs: No  Transportation Needs (04/28/2021)   PRAPARE - Hydrologist (Medical): No    Lack of Transportation (Non-Medical): No  Physical Activity: Inactive (04/28/2021)   Exercise Vital Sign    Days of Exercise per Week: 0 days    Minutes of Exercise per Session: 0 min  Stress: No Stress Concern Present (04/28/2021)   [place]  of Stress : Not at all  Social Connections: Moderately Isolated (04/28/2021)   Social Connection and Isolation Panel [NHANES]    Frequency of Communication with Friends and Family: Three times a week    Frequency of Social Gatherings with Friends and Family: Three times a week    Attends Religious Services: Never    Active Member of Clubs or Organizations: No    Attends Archivist Meetings: Never    Marital Status: Married    Tobacco Counseling Counseling given: Not Answered Tobacco comments: quit 13 years ago   Clinical Intake:                 Diabetic?         Activities of Daily Living    07/14/2021    9:34 AM  In your present state of health, do you have any difficulty performing the following activities:  Hearing? 0  Vision? 1  Difficulty concentrating or making decisions? 1  Walking or climbing stairs? 1  Comment pt has a cane foir ambulation  Dressing or bathing? 0  Doing errands, shopping? 0    Patient Care Team: [name], MD as PCP - General Buford Dresser, MD as PCP - Cardiology (Cardiology)  Indicate any recent Medical Services you may have received from other than [facility] providers in the past year (date may be approximate).     Assessment:   This is a routine wellness examination for [place].  Hearing/Vision screen No results found.  Dietary issues and exercise activities discussed:     Goals Addressed   None    Depression Screen    07/14/2021   10:40 AM 04/28/2021   10:38 AM 04/28/2021    10:34 AM 02/24/2020    3:59 PM 08/15/2018    1:25 PM  PHQ 2/9 Scores  PHQ - 2 Score 2 0 0 0 0  PHQ- 9 Score 6        Fall Risk    07/14/2021    9:33 AM 04/28/2021   10:36 AM 02/24/2020    3:58 PM 04/26/2019    8:11 AM 08/15/2018    1:25 PM  Fall Risk   Falls in the past year? 1 1 1 1  No  Number falls in past yr: 1 0 1 0   Injury with Fall? 1 0 1 1   Comment knee injury   Injured knee- not broken   Risk for fall due to : Impaired balance/gait Impaired balance/gait History of fall(s)    Follow up  Falls evaluation completed Falls evaluation completed Falls evaluation completed     FALL RISK PREVENTION PERTAINING TO THE HOME:  Any stairs in or around the home?  If so, are there any without handrails?  Home free of loose throw rugs in walkways, pet beds, electrical cords, etc?  Adequate lighting in your home to reduce risk of falls?   ASSISTIVE DEVICES UTILIZED TO PREVENT FALLS:  Life alert?  Use of a cane, walker or w/c?  Grab bars in the bathroom?  Shower chair or bench in shower?  Elevated toilet seat or a handicapped toilet?   TIMED UP AND GO:  Was the test performed?   Length of time to ambulate 10 feet:  sec.     Cognitive Function:      07/02/2019    3:00 PM 04/26/2019   12:00 PM  Montreal Cognitive Assessment   Visuospatial/ Executive (0/5) 2 2  Naming (0/3) 3 3  Attention: Read list  of digits (0/2) 2 2  Attention: Read list of letters (0/1) 0 0  Attention: Serial 7 subtraction starting at 100 (0/3) 0 0  Language: Repeat phrase (0/2) 1 1  Language : Fluency (0/1) 0 0  Abstraction (0/2) 1 1  Delayed Recall (0/5) 0 0  Orientation (0/6) 3 3  Total 12 12      Immunizations Immunization History  Administered Date(s) Administered   Fluad Quad(high Dose 65+) 07/02/2019, 07/14/2021   Influenza Split 09/04/2013   Influenza, High Dose Seasonal PF 09/18/2014, 09/02/2016, 08/08/2017, 08/15/2018   Influenza-Unspecified 07/28/2015   PFIZER(Purple  Top)SARS-COV-2 Vaccination 08/18/2020   Pneumococcal Conjugate-13 09/04/2013   Pneumococcal Polysaccharide-23 02/24/2016   Td 10/17/2002   Tdap 11/22/2016            Qualifies for Shingles Vaccine?   Zostavax completed     Screening Tests Health Maintenance  Topic Date Due   FOOT EXAM  Never done   OPHTHALMOLOGY EXAM  Never done   URINE MICROALBUMIN  Never done   Hepatitis C Screening  Never done   Zoster Vaccines- Shingrix (1 of 2) Never done   COVID-19 Vaccine (2 - Pfizer risk series) 09/08/2020   HEMOGLOBIN A1C  01/11/2022   INFLUENZA VACCINE  05/17/2022   TETANUS/TDAP  11/22/2026   COLONOSCOPY (Pts 45-16yr Insurance coverage will need to be confirmed)  11/27/2031   Pneumonia Vaccine 76 Years old  Completed   HPV VACCINES  Aged Out    Health Maintenance  Health Maintenance Due  Topic Date Due   FOOT EXAM  Never done   OPHTHALMOLOGY EXAM  Never done   URINE MICROALBUMIN  Never done   Hepatitis C Screening  Never done   Zoster Vaccines- Shingrix (1 of 2) Never done   COVID-19 Vaccine (2 - Pfizer risk series) 09/08/2020   HEMOGLOBIN A1C  01/11/2022      Lung Cancer Screening: (Low Dose CT Chest recommended if Age 73-80 years, 30 pack-year currently smoking OR have quit w/in 15years.)  qualify.   Lung Cancer Screening Referral:   Additional Screening:  Hepatitis C Screening:  qualify; Completed   Vision Screening: Recommended annual ophthalmology exams for early detection of glaucoma and other disorders of the eye. Is the patient up to date with their annual eye exam?   Who is the provider or what is the name of the office in which the patient attends annual eye exams?  If pt is not established with a provider, would they like to be referred to a provider to establish care? .   Dental Screening: Recommended annual dental exams for proper oral hygiene  Community Resource Referral / Chronic Care Management: CRR required this visit?    CCM required  this visit?       Plan:     I have personally reviewed and noted the following in the patient's chart:   Medical and social history Use of alcohol, tobacco or illicit drugs  Current medications and supplements including opioid prescriptions.  Functional ability and status Nutritional status Physical activity Advanced directives List of other physicians Hospitalizations, surgeries, and ER visits in previous 12 months Vitals Screenings to include cognitive, depression, and falls Referrals and appointments  In addition, I have reviewed and discussed with patient certain preventive protocols, quality metrics, and best practice recommendations. A written personalized care plan for preventive services as well as general preventive health recommendations were provided to patient.     BCriselda Peaches LPN   [id number]  Nurse Notes:    This encounter was created in error - please disregard.

## 2022-05-04 ENCOUNTER — Encounter: Payer: Self-pay | Admitting: Family Medicine

## 2022-05-04 ENCOUNTER — Ambulatory Visit (INDEPENDENT_AMBULATORY_CARE_PROVIDER_SITE_OTHER): Payer: Medicare Other | Admitting: Family Medicine

## 2022-05-04 VITALS — BP 128/80 | HR 99 | Temp 97.6°F | Wt 266.0 lb

## 2022-05-04 DIAGNOSIS — E119 Type 2 diabetes mellitus without complications: Secondary | ICD-10-CM | POA: Diagnosis not present

## 2022-05-04 DIAGNOSIS — F03B2 Unspecified dementia, moderate, with psychotic disturbance: Secondary | ICD-10-CM

## 2022-05-04 MED ORDER — QUETIAPINE FUMARATE 50 MG PO TABS: 50.0000 mg | ORAL_TABLET | Freq: Every day | ORAL | 2 refills | Status: DC

## 2022-05-04 NOTE — Progress Notes (Signed)
   Subjective:    Patient ID: [name], male    DOB: 05-Aug-1946, 76 y.o.   MRN: [id number]  HPI Here with his wife for slowly worsening confusion, poor memory, and hallucinations. We had an OV to discuss these very topics on 07-14-21. At that time I referred him to Neurology to be evaluated, but his wife said that she never followed up with them to make an appointment. These issues have worsened, and now he has a clear pattern of becoming more confused and more agitated every evening around 5 or 6 PM. He then has trouble sleeping the rest of the night, so he ends up napping during the day. He has never demonstrated any threatening behaviors to her or to himself.      Review of Systems  Constitutional: Negative.   Respiratory:  Positive for shortness of breath.   Cardiovascular:  Positive for leg swelling. Negative for chest pain.  Psychiatric/Behavioral:  Positive for agitation, behavioral problems, confusion, hallucinations and sleep disturbance. Negative for dysphoric mood, self-injury and suicidal ideas. The patient is nervous/anxious.        Objective:   Physical Exam Constitutional:      Comments: Walks with a cane   Cardiovascular:     Rate and Rhythm: Normal rate. Rhythm irregular.     Pulses: Normal pulses.     Heart sounds: Normal heart sounds.  Pulmonary:     Effort: Pulmonary effort is normal.     Breath sounds: Normal breath sounds.  Musculoskeletal:     Comments: 1+ edema in both lower legs   Neurological:     Mental Status: He is alert and oriented to person, place, and time.  Psychiatric:        Mood and Affect: Mood normal.        Behavior: Behavior normal.        Thought Content: Thought content normal.           Assessment & Plan:  He has a slowly worsening dementia, and we all agreed it would be a good idea for him to see Neurology. We put in another referral for this. We will also start him on Seroquel 50 mg at suppertime each evening. His wife  will report back in 2 weeks so we can adjust the dose if needed.  Alysia Penna, MD

## 2022-05-05 ENCOUNTER — Other Ambulatory Visit: Payer: Medicare Other

## 2022-05-06 ENCOUNTER — Emergency Department (HOSPITAL_COMMUNITY): Payer: Medicare Other

## 2022-05-06 ENCOUNTER — Inpatient Hospital Stay (HOSPITAL_COMMUNITY)
Admission: EM | Admit: 2022-05-06 | Discharge: 2022-05-23 | DRG: 643 | Disposition: A | Discharge status: Skilled Nursing Facility | Payer: Medicare Other | Attending: Internal Medicine | Admitting: Internal Medicine

## 2022-05-06 ENCOUNTER — Other Ambulatory Visit: Payer: Self-pay

## 2022-05-06 ENCOUNTER — Encounter (HOSPITAL_COMMUNITY): Payer: Self-pay | Admitting: *Deleted

## 2022-05-06 ENCOUNTER — Other Ambulatory Visit: Payer: Medicare Other

## 2022-05-06 DIAGNOSIS — Z515 Encounter for palliative care: Secondary | ICD-10-CM | POA: Diagnosis not present

## 2022-05-06 DIAGNOSIS — R443 Hallucinations, unspecified: Secondary | ICD-10-CM

## 2022-05-06 DIAGNOSIS — R7989 Other specified abnormal findings of blood chemistry: Secondary | ICD-10-CM | POA: Diagnosis present

## 2022-05-06 DIAGNOSIS — I5043 Acute on chronic combined systolic (congestive) and diastolic (congestive) heart failure: Secondary | ICD-10-CM | POA: Diagnosis not present

## 2022-05-06 DIAGNOSIS — I472 Ventricular tachycardia, unspecified: Secondary | ICD-10-CM | POA: Diagnosis not present

## 2022-05-06 DIAGNOSIS — C8590 Non-Hodgkin lymphoma, unspecified, unspecified site: Secondary | ICD-10-CM | POA: Diagnosis present

## 2022-05-06 DIAGNOSIS — F0390 Unspecified dementia without behavioral disturbance: Secondary | ICD-10-CM

## 2022-05-06 DIAGNOSIS — I428 Other cardiomyopathies: Secondary | ICD-10-CM | POA: Diagnosis present

## 2022-05-06 DIAGNOSIS — E876 Hypokalemia: Secondary | ICD-10-CM | POA: Diagnosis not present

## 2022-05-06 DIAGNOSIS — N401 Enlarged prostate with lower urinary tract symptoms: Secondary | ICD-10-CM | POA: Diagnosis present

## 2022-05-06 DIAGNOSIS — I7121 Aneurysm of the ascending aorta, without rupture: Secondary | ICD-10-CM | POA: Diagnosis present

## 2022-05-06 DIAGNOSIS — Z781 Physical restraint status: Secondary | ICD-10-CM

## 2022-05-06 DIAGNOSIS — E119 Type 2 diabetes mellitus without complications: Secondary | ICD-10-CM

## 2022-05-06 DIAGNOSIS — I480 Paroxysmal atrial fibrillation: Secondary | ICD-10-CM | POA: Diagnosis not present

## 2022-05-06 DIAGNOSIS — Z20822 Contact with and (suspected) exposure to covid-19: Secondary | ICD-10-CM | POA: Diagnosis present

## 2022-05-06 DIAGNOSIS — G4733 Obstructive sleep apnea (adult) (pediatric): Secondary | ICD-10-CM | POA: Diagnosis present

## 2022-05-06 DIAGNOSIS — Z66 Do not resuscitate: Secondary | ICD-10-CM | POA: Diagnosis not present

## 2022-05-06 DIAGNOSIS — Z91199 Patient's noncompliance with other medical treatment and regimen due to unspecified reason: Secondary | ICD-10-CM

## 2022-05-06 DIAGNOSIS — J9811 Atelectasis: Secondary | ICD-10-CM | POA: Diagnosis not present

## 2022-05-06 DIAGNOSIS — N390 Urinary tract infection, site not specified: Secondary | ICD-10-CM | POA: Diagnosis present

## 2022-05-06 DIAGNOSIS — F17210 Nicotine dependence, cigarettes, uncomplicated: Secondary | ICD-10-CM | POA: Diagnosis present

## 2022-05-06 DIAGNOSIS — G9341 Metabolic encephalopathy: Secondary | ICD-10-CM | POA: Diagnosis present

## 2022-05-06 DIAGNOSIS — I878 Other specified disorders of veins: Secondary | ICD-10-CM | POA: Diagnosis present

## 2022-05-06 DIAGNOSIS — Z7189 Other specified counseling: Secondary | ICD-10-CM | POA: Diagnosis not present

## 2022-05-06 DIAGNOSIS — J9602 Acute respiratory failure with hypercapnia: Secondary | ICD-10-CM | POA: Diagnosis not present

## 2022-05-06 DIAGNOSIS — R609 Edema, unspecified: Secondary | ICD-10-CM | POA: Diagnosis not present

## 2022-05-06 DIAGNOSIS — F0392 Unspecified dementia, unspecified severity, with psychotic disturbance: Secondary | ICD-10-CM | POA: Diagnosis present

## 2022-05-06 DIAGNOSIS — I5022 Chronic systolic (congestive) heart failure: Secondary | ICD-10-CM | POA: Diagnosis not present

## 2022-05-06 DIAGNOSIS — R001 Bradycardia, unspecified: Secondary | ICD-10-CM | POA: Diagnosis not present

## 2022-05-06 DIAGNOSIS — N3000 Acute cystitis without hematuria: Secondary | ICD-10-CM

## 2022-05-06 DIAGNOSIS — E874 Mixed disorder of acid-base balance: Secondary | ICD-10-CM | POA: Diagnosis not present

## 2022-05-06 DIAGNOSIS — E0590 Thyrotoxicosis, unspecified without thyrotoxic crisis or storm: Secondary | ICD-10-CM | POA: Diagnosis not present

## 2022-05-06 DIAGNOSIS — N179 Acute kidney failure, unspecified: Secondary | ICD-10-CM | POA: Diagnosis not present

## 2022-05-06 DIAGNOSIS — Z7901 Long term (current) use of anticoagulants: Secondary | ICD-10-CM

## 2022-05-06 DIAGNOSIS — Z79899 Other long term (current) drug therapy: Secondary | ICD-10-CM

## 2022-05-06 DIAGNOSIS — T462X5A Adverse effect of other antidysrhythmic drugs, initial encounter: Secondary | ICD-10-CM | POA: Diagnosis present

## 2022-05-06 DIAGNOSIS — Z7984 Long term (current) use of oral hypoglycemic drugs: Secondary | ICD-10-CM

## 2022-05-06 DIAGNOSIS — I959 Hypotension, unspecified: Secondary | ICD-10-CM | POA: Diagnosis not present

## 2022-05-06 DIAGNOSIS — E1122 Type 2 diabetes mellitus with diabetic chronic kidney disease: Secondary | ICD-10-CM | POA: Diagnosis present

## 2022-05-06 DIAGNOSIS — R4182 Altered mental status, unspecified: Secondary | ICD-10-CM | POA: Diagnosis not present

## 2022-05-06 DIAGNOSIS — E11649 Type 2 diabetes mellitus with hypoglycemia without coma: Secondary | ICD-10-CM | POA: Diagnosis not present

## 2022-05-06 DIAGNOSIS — E0581 Other thyrotoxicosis with thyrotoxic crisis or storm: Secondary | ICD-10-CM | POA: Diagnosis present

## 2022-05-06 DIAGNOSIS — R778 Other specified abnormalities of plasma proteins: Secondary | ICD-10-CM | POA: Diagnosis not present

## 2022-05-06 DIAGNOSIS — R338 Other retention of urine: Secondary | ICD-10-CM | POA: Diagnosis not present

## 2022-05-06 DIAGNOSIS — B957 Other staphylococcus as the cause of diseases classified elsewhere: Secondary | ICD-10-CM | POA: Diagnosis present

## 2022-05-06 DIAGNOSIS — Z6834 Body mass index (BMI) 34.0-34.9, adult: Secondary | ICD-10-CM | POA: Diagnosis not present

## 2022-05-06 DIAGNOSIS — R6510 Systemic inflammatory response syndrome (SIRS) of non-infectious origin without acute organ dysfunction: Principal | ICD-10-CM

## 2022-05-06 DIAGNOSIS — I10 Essential (primary) hypertension: Secondary | ICD-10-CM | POA: Diagnosis present

## 2022-05-06 DIAGNOSIS — Z833 Family history of diabetes mellitus: Secondary | ICD-10-CM

## 2022-05-06 DIAGNOSIS — J9601 Acute respiratory failure with hypoxia: Secondary | ICD-10-CM | POA: Diagnosis not present

## 2022-05-06 DIAGNOSIS — I440 Atrioventricular block, first degree: Secondary | ICD-10-CM | POA: Diagnosis not present

## 2022-05-06 DIAGNOSIS — I130 Hypertensive heart and chronic kidney disease with heart failure and stage 1 through stage 4 chronic kidney disease, or unspecified chronic kidney disease: Secondary | ICD-10-CM | POA: Diagnosis present

## 2022-05-06 DIAGNOSIS — N1831 Chronic kidney disease, stage 3a: Secondary | ICD-10-CM | POA: Diagnosis present

## 2022-05-06 DIAGNOSIS — R531 Weakness: Secondary | ICD-10-CM | POA: Diagnosis not present

## 2022-05-06 DIAGNOSIS — Z8249 Family history of ischemic heart disease and other diseases of the circulatory system: Secondary | ICD-10-CM

## 2022-05-06 DIAGNOSIS — I5032 Chronic diastolic (congestive) heart failure: Secondary | ICD-10-CM | POA: Diagnosis not present

## 2022-05-06 DIAGNOSIS — F03918 Unspecified dementia, unspecified severity, with other behavioral disturbance: Secondary | ICD-10-CM | POA: Diagnosis present

## 2022-05-06 DIAGNOSIS — E785 Hyperlipidemia, unspecified: Secondary | ICD-10-CM | POA: Diagnosis present

## 2022-05-06 DIAGNOSIS — E870 Hyperosmolality and hypernatremia: Secondary | ICD-10-CM | POA: Diagnosis not present

## 2022-05-06 DIAGNOSIS — E7800 Pure hypercholesterolemia, unspecified: Secondary | ICD-10-CM | POA: Diagnosis not present

## 2022-05-06 DIAGNOSIS — Z823 Family history of stroke: Secondary | ICD-10-CM

## 2022-05-06 DIAGNOSIS — Z809 Family history of malignant neoplasm, unspecified: Secondary | ICD-10-CM

## 2022-05-06 DIAGNOSIS — Z800 Family history of malignant neoplasm of digestive organs: Secondary | ICD-10-CM

## 2022-05-06 LAB — BLOOD GAS, VENOUS
Acid-Base Excess: 0.3 mmol/L (ref 0.0–2.0)
Bicarbonate: 26.5 mmol/L (ref 20.0–28.0)
O2 Saturation: 65.4 %
Patient temperature: 37
pCO2, Ven: 48 mmHg (ref 44–60)
pH, Ven: 7.35 (ref 7.25–7.43)
pO2, Ven: 41 mmHg (ref 32–45)

## 2022-05-06 LAB — CBC WITH DIFFERENTIAL/PLATELET
Abs Immature Granulocytes: 0.02 10*3/uL (ref 0.00–0.07)
Basophils Absolute: 0 10*3/uL (ref 0.0–0.1)
Basophils Relative: 1 %
Eosinophils Absolute: 0.2 10*3/uL (ref 0.0–0.5)
Eosinophils Relative: 3 %
HCT: 42.4 % (ref 39.0–52.0)
Hemoglobin: 13.6 g/dL (ref 13.0–17.0)
Immature Granulocytes: 0 %
Lymphocytes Relative: 17 %
Lymphs Abs: 1.1 10*3/uL (ref 0.7–4.0)
MCH: 29.2 pg (ref 26.0–34.0)
MCHC: 32.1 g/dL (ref 30.0–36.0)
MCV: 91 fL (ref 80.0–100.0)
Monocytes Absolute: 0.7 10*3/uL (ref 0.1–1.0)
Monocytes Relative: 11 %
Neutro Abs: 4.2 10*3/uL (ref 1.7–7.7)
Neutrophils Relative %: 68 %
Platelets: 254 10*3/uL (ref 150–400)
RBC: 4.66 MIL/uL (ref 4.22–5.81)
RDW: 15.3 % (ref 11.5–15.5)
WBC: 6.1 10*3/uL (ref 4.0–10.5)
nRBC: 0 % (ref 0.0–0.2)

## 2022-05-06 LAB — COMPREHENSIVE METABOLIC PANEL
ALT: 14 U/L (ref 0–44)
AST: 16 U/L (ref 15–41)
Albumin: 3.1 g/dL — ABNORMAL LOW (ref 3.5–5.0)
Alkaline Phosphatase: 61 U/L (ref 38–126)
Anion gap: 10 (ref 5–15)
BUN: 26 mg/dL — ABNORMAL HIGH (ref 8–23)
CO2: 25 mmol/L (ref 22–32)
Calcium: 8.9 mg/dL (ref 8.9–10.3)
Chloride: 110 mmol/L (ref 98–111)
Creatinine, Ser: 1.48 mg/dL — ABNORMAL HIGH (ref 0.61–1.24)
GFR, Estimated: 49 mL/min — ABNORMAL LOW (ref >60)
Glucose, Bld: 102 mg/dL — ABNORMAL HIGH (ref 70–99)
Potassium: 3.6 mmol/L (ref 3.5–5.1)
Sodium: 145 mmol/L (ref 135–145)
Total Bilirubin: 0.8 mg/dL (ref 0.3–1.2)
Total Protein: 6 g/dL — ABNORMAL LOW (ref 6.5–8.1)

## 2022-05-06 LAB — RAPID URINE DRUG SCREEN, HOSP PERFORMED
Amphetamines: NOT DETECTED
Barbiturates: NOT DETECTED
Benzodiazepines: NOT DETECTED
Cocaine: NOT DETECTED
Opiates: NOT DETECTED
Tetrahydrocannabinol: NOT DETECTED

## 2022-05-06 LAB — URINALYSIS, ROUTINE W REFLEX MICROSCOPIC
Bilirubin Urine: NEGATIVE
Glucose, UA: NEGATIVE mg/dL
Hgb urine dipstick: NEGATIVE
Ketones, ur: NEGATIVE mg/dL
Nitrite: POSITIVE — AB
Protein, ur: 100 mg/dL — AB
Specific Gravity, Urine: 1.017 (ref 1.005–1.030)
pH: 5 (ref 5.0–8.0)

## 2022-05-06 LAB — RESP PANEL BY RT-PCR (FLU A&B, COVID) ARPGX2
Influenza A by PCR: NEGATIVE
Influenza B by PCR: NEGATIVE
SARS Coronavirus 2 by RT PCR: NEGATIVE

## 2022-05-06 LAB — LACTIC ACID, PLASMA
Lactic Acid, Venous: 2.4 mmol/L (ref 0.5–1.9)
Lactic Acid, Venous: 1.8 mmol/L (ref 0.5–1.9)

## 2022-05-06 LAB — TROPONIN I (HIGH SENSITIVITY)
Troponin I (High Sensitivity): 83 ng/L — ABNORMAL HIGH (ref <18)
Troponin I (High Sensitivity): 86 ng/L — ABNORMAL HIGH (ref <18)

## 2022-05-06 LAB — GLUCOSE, CAPILLARY: Glucose-Capillary: 88 mg/dL (ref 70–99)

## 2022-05-06 LAB — TSH: TSH: <0.01 u[IU]/mL — ABNORMAL LOW (ref 0.350–4.500)

## 2022-05-06 LAB — ETHANOL: Alcohol, Ethyl (B): <10 mg/dL (ref <10)

## 2022-05-06 LAB — CBG MONITORING, ED: Glucose-Capillary: 92 mg/dL (ref 70–99)

## 2022-05-06 LAB — AMMONIA: Ammonia: 21 umol/L (ref 9–35)

## 2022-05-06 LAB — D-DIMER, QUANTITATIVE: D-Dimer, Quant: 1.17 ug/mL-FEU — ABNORMAL HIGH (ref 0.00–0.50)

## 2022-05-06 LAB — BRAIN NATRIURETIC PEPTIDE: B Natriuretic Peptide: 2258.1 pg/mL — ABNORMAL HIGH (ref 0.0–100.0)

## 2022-05-06 LAB — T4, FREE: Free T4: 4.01 ng/dL — ABNORMAL HIGH (ref 0.61–1.12)

## 2022-05-06 MED ORDER — FUROSEMIDE 40 MG PO TABS
40.0000 mg | ORAL_TABLET | Freq: Every day | ORAL | Status: DC
  Administered 2022-05-06 – 2022-05-10 (×5): 40 mg via ORAL
  Filled 2022-05-06 (×5): qty 1

## 2022-05-06 MED ORDER — ADULT MULTIVITAMIN W/MINERALS CH
1.0000 | ORAL_TABLET | Freq: Every day | ORAL | Status: DC
  Administered 2022-05-06 – 2022-05-23 (×17): 1 via ORAL
  Filled 2022-05-06 (×17): qty 1

## 2022-05-06 MED ORDER — AMIODARONE HCL 200 MG PO TABS
200.0000 mg | ORAL_TABLET | Freq: Every day | ORAL | Status: DC
  Administered 2022-05-06 – 2022-05-08 (×3): 200 mg via ORAL
  Filled 2022-05-06 (×4): qty 1

## 2022-05-06 MED ORDER — ENSURE ENLIVE PO LIQD
237.0000 mL | Freq: Two times a day (BID) | ORAL | Status: DC
  Administered 2022-05-07 – 2022-05-13 (×6): 237 mL via ORAL

## 2022-05-06 MED ORDER — SODIUM CHLORIDE 0.9 % IV SOLN
2.0000 g | INTRAVENOUS | Status: DC
  Administered 2022-05-06 – 2022-05-08 (×3): 2 g via INTRAVENOUS
  Filled 2022-05-06 (×3): qty 20

## 2022-05-06 MED ORDER — METOPROLOL SUCCINATE ER 50 MG PO TB24
200.0000 mg | ORAL_TABLET | Freq: Every day | ORAL | Status: DC
  Administered 2022-05-07 – 2022-05-09 (×3): 200 mg via ORAL
  Filled 2022-05-06 (×3): qty 4

## 2022-05-06 MED ORDER — SPIRONOLACTONE 25 MG PO TABS
25.0000 mg | ORAL_TABLET | Freq: Every day | ORAL | Status: DC
  Administered 2022-05-06 – 2022-05-10 (×5): 25 mg via ORAL
  Filled 2022-05-06 (×6): qty 1

## 2022-05-06 MED ORDER — LORAZEPAM 2 MG/ML IJ SOLN
1.0000 mg | Freq: Four times a day (QID) | INTRAMUSCULAR | Status: DC | PRN
  Administered 2022-05-07: 1 mg via INTRAVENOUS
  Filled 2022-05-06: qty 1

## 2022-05-06 MED ORDER — INSULIN ASPART 100 UNIT/ML IJ SOLN
0.0000 [IU] | Freq: Every day | INTRAMUSCULAR | Status: DC
  Filled 2022-05-06: qty 0.05

## 2022-05-06 MED ORDER — MELATONIN 5 MG PO TABS
5.0000 mg | ORAL_TABLET | Freq: Every day | ORAL | Status: DC
  Administered 2022-05-06 – 2022-05-10 (×5): 5 mg via ORAL
  Filled 2022-05-06 (×5): qty 1

## 2022-05-06 MED ORDER — INSULIN ASPART 100 UNIT/ML IJ SOLN
0.0000 [IU] | Freq: Three times a day (TID) | INTRAMUSCULAR | Status: DC
  Administered 2022-05-09: 2 [IU] via SUBCUTANEOUS
  Filled 2022-05-06: qty 0.15

## 2022-05-06 MED ORDER — ACETAMINOPHEN 650 MG RE SUPP: 650.0000 mg | Freq: Four times a day (QID) | RECTAL | Status: DC | PRN

## 2022-05-06 MED ORDER — APIXABAN 5 MG PO TABS
5.0000 mg | ORAL_TABLET | Freq: Two times a day (BID) | ORAL | Status: DC
  Administered 2022-05-06 – 2022-05-23 (×31): 5 mg via ORAL
  Filled 2022-05-06 (×31): qty 1

## 2022-05-06 MED ORDER — ACETAMINOPHEN 325 MG PO TABS
650.0000 mg | ORAL_TABLET | Freq: Four times a day (QID) | ORAL | Status: DC | PRN
  Administered 2022-05-09 – 2022-05-13 (×2): 650 mg via ORAL
  Filled 2022-05-06 (×2): qty 2

## 2022-05-06 MED ORDER — ATORVASTATIN CALCIUM 10 MG PO TABS
20.0000 mg | ORAL_TABLET | Freq: Every day | ORAL | Status: DC
  Administered 2022-05-06 – 2022-05-23 (×17): 20 mg via ORAL
  Filled 2022-05-06: qty 2
  Filled 2022-05-06: qty 1
  Filled 2022-05-06 (×7): qty 2
  Filled 2022-05-06 (×2): qty 1
  Filled 2022-05-06: qty 2
  Filled 2022-05-06: qty 1
  Filled 2022-05-06: qty 2
  Filled 2022-05-06: qty 1
  Filled 2022-05-06 (×2): qty 2

## 2022-05-06 MED ORDER — FLUOXETINE HCL 20 MG PO CAPS
40.0000 mg | ORAL_CAPSULE | Freq: Every day | ORAL | Status: DC
  Administered 2022-05-06 – 2022-05-23 (×17): 40 mg via ORAL
  Filled 2022-05-06 (×17): qty 2

## 2022-05-06 NOTE — ED Triage Notes (Signed)
BIB EMS from home. Last week on and off hallucinations, mild dementia. None noted with EMS. Feels generally weak, moving slower. 114/76-60-96% -22 Cap 35 CBG 104

## 2022-05-06 NOTE — H&P (Signed)
History and Physical    Patient: [name] MPN:[id number] DOB: [birth year] DOA: 05/06/2022 DOS: the patient was seen and examined on 05/06/2022 PCP: [name], MD  Patient coming from: Home  Chief Complaint:  Chief Complaint  Patient presents with   Hallucinations   HPI: [name] is a 76 y.o. male with medical history significant of PAF, HLD, DM2, chronic HFrEF, dementia. Presenting with altered mental status. History is per daughter at bedside. She reports that the patient has had increased confusion over the last several days. He's been hallucinating about objects that are not present. They have been working with their PCP about his progressing dementia for some time now. He was recently started on seroquel a couple days ago.  He seems to have worsened in that time. He has not complained of dyspnea, dysuria, fevers, N/V/D. When his symptoms didn't improve today, his family decided to bring him to the ED for assistance.   Review of Systems: As mentioned in the history of present illness. All other systems reviewed and are negative. Past Medical History:  Diagnosis Date   Chronic systolic CHF (congestive heart failure) ([place])    a. Echo (08/28/13): Mild LVH, EF 35-40%, diffuse HK, moderate to severe LAE.   Diabetes mellitus without complication (HCC)    Diverticulitis    Headache(784.0)    Hyperlipidemia    Hypertension    Lymphoma, non Hodgkin's    sees Dr. [name]    Morbid obesity [facility])    NICM (nonischemic cardiomyopathy) ([place])    a. R/L Heart cath 08/30/13: RA mean 8, RV 30/0, PA 27/12, mean PCWP 9, CO 5.67, CI 1.98; normal coronary arteries   Sleep apnea    Past Surgical History:  Procedure Laterality Date   CARDIOVERSION N/A 06/28/2019   Procedure: CARDIOVERSION;  Surgeon: Lelon Perla, MD;  Location: [facility];  Service: Cardiovascular;  Laterality: N/A;   COLONOSCOPY  11/26/2021   HERNIA REPAIR     umblical   incarcerated hernia     vental  11/19/08 Dr. [name]   LEFT AND RIGHT HEART CATHETERIZATION WITH CORONARY ANGIOGRAM N/A 08/30/2013   Procedure: LEFT AND RIGHT HEART CATHETERIZATION WITH CORONARY ANGIOGRAM;  Surgeon: [name], MD;  Location: [facility] CATH LAB;  Service: Cardiovascular;  Laterality: N/A;   Social History:  reports that he quit smoking about 22 years ago. His smoking use included cigarettes. He started smoking about 56 years ago. He has a 104.00 pack-year smoking history. He has never used smokeless tobacco. He reports current alcohol use. He reports that he does not use drugs.  No Known Allergies  Family History  Problem Relation Age of Onset   Heart failure Mother    Hypertension Mother    Cancer Father        colon   Hypertension Father    Diabetes Father    Stroke Maternal Grandmother    Diabetes Paternal Grandfather    Cancer Maternal Grandfather     Prior to Admission medications   Medication Sig Start Date End Date Taking? Authorizing Provider  acetaminophen (TYLENOL) 325 MG tablet Take 650 mg by mouth 2 (two) times daily as needed (pain).   Yes [provider]  amiodarone (PACERONE) 200 MG tablet Take 1 tablet (200 mg total) by mouth daily. 11/05/21  Yes Buford Dresser, MD  apixaban (ELIQUIS) 5 MG TABS tablet TAKE 1 TABLET BY MOUTH TWICE A DAY Patient taking differently: Take 5 mg by mouth 2 (two) times daily.  08/23/21  Yes Buford Dresser, MD  atorvastatin (LIPITOR) 20 MG tablet TAKE 1 TABLET BY MOUTH EVERY DAY Patient taking differently: Take 20 mg by mouth daily. 04/04/22  Yes [name], MD  FLUoxetine (PROZAC) 40 MG capsule TAKE 1 CAPSULE BY MOUTH EVERY DAY Patient taking differently: Take 40 mg by mouth daily. 01/03/22  Yes [name], MD  furosemide (LASIX) 40 MG tablet TAKE 1 TABLET BY MOUTH EVERY DAY Patient taking differently: Take 40 mg by mouth daily. 04/04/22  Yes [name], MD  metFORMIN (GLUCOPHAGE) 500 MG tablet TAKE 1 TABLET BY MOUTH TWICE  DAILY WITH A MEAL Patient taking differently: Take 500 mg by mouth 2 (two) times daily with a meal. 09/21/21  Yes [name], MD  metoprolol (TOPROL-XL) 200 MG 24 hr tablet TAKE 1 TABLET BY MOUTH EVERY DAY Patient taking differently: Take 200 mg by mouth daily. 04/04/22  Yes [name], MD  Multiple Vitamins-Minerals (MULTIVITAMIN MEN 50+) TABS Take 1 tablet by mouth daily.   Yes [provider]  QUEtiapine (SEROQUEL) 50 MG tablet Take 1 tablet (50 mg total) by mouth at bedtime. 05/04/22  Yes [name], MD  spironolactone (ALDACTONE) 25 MG tablet TAKE 1 TABLET BY MOUTH EVERY DAY Patient taking differently: Take 25 mg by mouth daily. 12/28/21  Yes [name], MD  blood glucose meter kit and supplies Dispense based on patient and insurance preference. Use up to four times daily as directed. (FOR ICD-10 E10.9, E11.9). 03/15/22   [name], MD    Physical Exam: Vitals:   05/06/22 1145 05/06/22 1200 05/06/22 1306 05/06/22 1310  BP:  (!) 120/94 110/77   Pulse: 66 (!) 113 81   Resp: 18 (!) 28 20   Temp:   (!) 97.5 F (36.4 C) 97.6 F (36.4 C)  TempSrc:   Oral   SpO2: 95% 97% 100%   Weight:       General: 76 y.o. male resting in bed in NAD Eyes: PERRL, normal sclera ENMT: Nares patent w/o discharge, orophaynx clear, dentition normal, ears w/o discharge/lesions/ulcers Neck: Supple, trachea midline Cardiovascular: RRR, +S1, S2, no m/g/r, equal pulses throughout Respiratory: CTABL, no w/r/r, normal WOB GI: BS+, NDNT, no masses noted, no organomegaly noted MSK: No c/c; trace BLE edema, chronic BLE venous changes Neuro: A&O x 3, no focal deficits Psyc: confused but, calm/cooperative  Data Reviewed:  Lab Results  Component Value Date   NA 145 05/06/2022   K 3.6 05/06/2022   CO2 25 05/06/2022   GLUCOSE 102 (H) 05/06/2022   BUN 26 (H) 05/06/2022   CREATININE 1.48 (H) 05/06/2022   CALCIUM 8.9 05/06/2022   GFRNONAA 49 (L) 05/06/2022   TSH: <0.10 BNP:  2,258 Trp: 83 -> 86  Lab Results  Component Value Date   WBC 6.1 05/06/2022   HGB 13.6 05/06/2022   HCT 42.4 05/06/2022   MCV 91.0 05/06/2022   PLT 254 05/06/2022   CTH: No acute intracranial abnormality.  Moderate cerebral atrophy.  CXR: 1. Mild pulmonary vascular congestion and small bilateral pleural effusions, suggestive of congestive heart failure  EKG: sinus w/ PVCs, no st elevations noted  Assessment and Plan: Acute on metabolic encephalopathy Dementia, unspecified type     - admit to inpt, tele     - recently started on seroquel for progressing dementia; will hold that for right now     - treat his UTI and see if he improves     -  PRN ativan     - tele sitter     - question if thyroid issues are playing a role; see below  UTI     - started on rocephin; continue for now     - follow Ucx, Bld Cx  CKD3a     - he is at baseline, follow  PAF HTN     - continue home regimen  Chronic diastolic HF Elevated trp     - trace if any BLE edema (he does have chronic venous changes) and CXR is suggestive of HF; although he has no dyspnea     - his BNP is very high, but he has no clinical symptoms of acute HF     - will continue his home diuretic for now; watch daily weights and I&O     - trp are flat, no chest pain, will check echo given elevated BNP/trp  DM2     - A1c, SSI, DM diet, glucose  HLD     - continue home regimen  Low TSH     - check FT4, FT3  Elevated d-dimer     - he's chronically on eliquis     - let's start with checking venous dopplers of legs; if negative can check V/Q scan  Advance Care Planning:   Code Status: FULL  Consults: None  Family Communication: w/ wife/dtr at bedside  Severity of Illness: The appropriate patient status for this patient is INPATIENT. Inpatient status is judged to be reasonable and necessary in order to provide the required intensity of service to ensure the patient's safety. The patient's presenting symptoms,  physical exam findings, and initial radiographic and laboratory data in the context of their chronic comorbidities is felt to place them at high risk for further clinical deterioration. Furthermore, it is not anticipated that the patient will be medically stable for discharge from the hospital within 2 midnights of admission.   * I certify that at the point of admission it is my clinical judgment that the patient will require inpatient hospital care spanning beyond 2 midnights from the point of admission due to high intensity of service, high risk for further deterioration and high frequency of surveillance required.*  Author: Jonnie Finner, DO 05/06/2022 1:58 PM  For on call review [email or web address].

## 2022-05-06 NOTE — ED Notes (Signed)
Called lab to add on urine culture ?

## 2022-05-06 NOTE — ED Notes (Signed)
I & O Cath completed, urine dark in color. Pt tol well

## 2022-05-06 NOTE — ED Notes (Addendum)
High fall risk interventions completed including bed alarm, yellow armband and yellow socks placed by this RN.

## 2022-05-06 NOTE — ED Provider Notes (Signed)
[facility] DEPT Provider Note   CSN: [id number] Arrival date & time: 05/06/22  0859     History  Chief Complaint  Patient presents with   Hallucinations    [name] is a 76 y.o. male.  HPI Patient has had some incremental changes suspected to be dementia.  He has been seen by neurology but has not had formal diagnosis at this time.  Patient is being seen with his daughter and wife as historians.  Patient is also alert and interactive but unsure of the course of events.  He is aware that he has been told he is having hallucinations although he is not sure if it is actually hallucinations or things that are there and then gone.  Patient's wife and daughter reports that he has had intermittent hallucinations for a while but over the past day there was a dramatic change.  He started to see many different things in the house and was up talking all night long.  They report this is not typical for him.  He is usually directable and now seems to be confused and picking at a lot of things.  Patient had been started on Seroquel a couple of days by his primary care provider.  Family members report there has been a dramatic worsening in the past day and were unsure if it was the Seroquel but were concerned that he might have other infection or other cause.  Report he has had decreased appetite.  Has not been vomiting or documented fever.  He has not had any specific pain complaints.    Home Medications Prior to Admission medications   Medication Sig Start Date End Date Taking? Authorizing Provider  amiodarone (PACERONE) 200 MG tablet Take 1 tablet (200 mg total) by mouth daily. 11/05/21   Buford Dresser, MD  apixaban (ELIQUIS) 5 MG TABS tablet TAKE 1 TABLET BY MOUTH TWICE A DAY 08/23/21   Buford Dresser, MD  atorvastatin (LIPITOR) 20 MG tablet TAKE 1 TABLET BY MOUTH EVERY DAY 04/04/22   [name], MD  blood glucose meter kit and supplies  Dispense based on patient and insurance preference. Use up to four times daily as directed. (FOR ICD-10 E10.9, E11.9). 03/15/22   [name], MD  FLUoxetine (PROZAC) 40 MG capsule TAKE 1 CAPSULE BY MOUTH EVERY DAY 01/03/22   [name], MD  furosemide (LASIX) 40 MG tablet TAKE 1 TABLET BY MOUTH EVERY DAY 04/04/22   [name], MD  metFORMIN (GLUCOPHAGE) 500 MG tablet TAKE 1 TABLET BY MOUTH TWICE DAILY WITH A MEAL 09/21/21   [name], MD  metoprolol (TOPROL-XL) 200 MG 24 hr tablet TAKE 1 TABLET BY MOUTH EVERY DAY 04/04/22   [name], MD  Multiple Vitamins-Minerals (MULTIVITAMIN MEN 50+) TABS Take 1 tablet by mouth daily.    [provider]  QUEtiapine (SEROQUEL) 50 MG tablet Take 1 tablet (50 mg total) by mouth at bedtime. 05/04/22   [name], MD  spironolactone (ALDACTONE) 25 MG tablet TAKE 1 TABLET BY MOUTH EVERY DAY 12/28/21   [name], MD      Allergies    Patient has no known allergies.    Review of Systems   Review of Systems Level 5 caveat cannot obtain review of systems due to dementia/confusion. Physical Exam Updated Vital Signs BP 110/77 (BP Location: Left Arm)   Pulse 81   Temp 97.6 F (36.4 C)   Resp 20  Wt 120.7 kg   SpO2 100%   BMI 32.38 kg/m  Physical Exam Constitutional:      Comments: Patient is alert and pleasant.  He smiles and interacts.  He is however picking at wires and moving the sheets around a lot.  Mild tachypnea.  HENT:     Mouth/Throat:     Pharynx: Oropharynx is clear.  Eyes:     Extraocular Movements: Extraocular movements intact.  Cardiovascular:     Rate and Rhythm: Normal rate and regular rhythm.  Pulmonary:     Effort: Pulmonary effort is normal.     Breath sounds: Normal breath sounds.  Abdominal:     General: There is no distension.     Palpations: Abdomen is soft.     Tenderness: There is no abdominal tenderness. There is no guarding.  Musculoskeletal:     Comments: Patient has hyperpigmentation  consistent with chronic venous stasis legs however do not appear to have secondary infection.  No erythema.  There are small shallow wounds consistent with probable PAD.  Hypertrophic thickened toenails.  Skin:    General: Skin is warm and dry.  Neurological:     Comments: Patient is alert and pleasantly interactive.  He is pleasantly confused.  He is picking at items in bed.  He has coordinated use of both upper extremities.  The speech is clear.  Psychiatric:        Mood and Affect: Mood normal.     ED Results / Procedures / Treatments   Labs (all labs ordered are listed, but only abnormal results are displayed) Labs Reviewed  COMPREHENSIVE METABOLIC PANEL - Abnormal; Notable for the following components:      Result Value   Glucose, Bld 102 (*)    BUN 26 (*)    Creatinine, Ser 1.48 (*)    Total Protein 6.0 (*)    Albumin 3.1 (*)    GFR, Estimated 49 (*)    All other components within normal limits  URINALYSIS, ROUTINE W REFLEX MICROSCOPIC - Abnormal; Notable for the following components:   Color, Urine AMBER (*)    Protein, ur 100 (*)    Nitrite POSITIVE (*)    Leukocytes,Ua TRACE (*)    Bacteria, UA RARE (*)    All other components within normal limits  TSH - Abnormal; Notable for the following components:   TSH <0.010 (*)    All other components within normal limits  D-DIMER, QUANTITATIVE - Abnormal; Notable for the following components:   D-Dimer, Quant 1.17 (*)    All other components within normal limits  BRAIN NATRIURETIC PEPTIDE - Abnormal; Notable for the following components:   B Natriuretic Peptide 2,258.1 (*)    All other components within normal limits  TROPONIN I (HIGH SENSITIVITY) - Abnormal; Notable for the following components:   Troponin I (High Sensitivity) 83 (*)    All other components within normal limits  TROPONIN I (HIGH SENSITIVITY) - Abnormal; Notable for the following components:   Troponin I (High Sensitivity) 86 (*)    All other components  within normal limits  RESP PANEL BY RT-PCR (FLU A&B, COVID) ARPGX2  URINE CULTURE  CULTURE, BLOOD (ROUTINE X 2)  CULTURE, BLOOD (ROUTINE X 2)  ETHANOL  CBC WITH DIFFERENTIAL/PLATELET  BLOOD GAS, VENOUS  AMMONIA  RAPID URINE DRUG SCREEN, HOSP PERFORMED  LACTIC ACID, PLASMA  LACTIC ACID, PLASMA    EKG EKG Interpretation  Date/Time:  Friday May 06 2022 09:24:38 EDT Ventricular Rate:  71 PR  Interval:  218 QRS Duration: 146 QT Interval:  577 QTC Calculation: 628 R Axis:   -44 Text Interpretation: Sinus rhythm Ventricular trigeminy Borderline prolonged PR interval Nonspecific IVCD with LAD Consider anterior infarct similar to previous except frequent pvc Confirmed by Charlesetta Shanks [phone]) on 05/06/2022 1:29:32 PM  Radiology CT Head Wo Contrast  Result Date: 05/06/2022 CLINICAL DATA:  Mental status change EXAM: CT HEAD WITHOUT CONTRAST TECHNIQUE: Contiguous axial images were obtained from the base of the skull through the vertex without intravenous contrast. RADIATION DOSE REDUCTION: This exam was performed according to the departmental dose-optimization program which includes automated exposure control, adjustment of the mA and/or kV according to patient size and/or use of iterative reconstruction technique. COMPARISON:  CT head 04/07/2019 FINDINGS: Motion artifact mildly obscures fine detail. Brain: No evidence of acute infarction, hemorrhage, hydrocephalus, extra-axial collection or mass lesion/mass effect. Moderate cerebral volume loss. Ill-defined hypoattenuation within the cerebral white matter is nonspecific but consistent with chronic small vessel ischemic disease. Vascular: No hyperdense vessel or unexpected calcification. Skull: Normal. Negative for fracture or focal lesion. Sinuses/Orbits: Nearly resolved left mastoid effusion. The paranasal sinuses are well aerated. Unremarkable orbits. Other: None. IMPRESSION: No acute intracranial abnormality.  Moderate cerebral atrophy.  Electronically Signed   By: Placido Sou M.D.   On: 05/06/2022 09:58   DG Chest 2 View  Result Date: 05/06/2022 CLINICAL DATA:  Weakness and altered mental status. EXAM: CHEST - 2 VIEW COMPARISON:  Chest x-ray dated April 07, 2019. FINDINGS: Borderline cardiomegaly. Mild pulmonary vascular congestion. Small bilateral pleural effusions with left-greater-than-right lower lobe atelectasis. No pneumothorax. No acute osseous abnormality. IMPRESSION: 1. Mild pulmonary vascular congestion and small bilateral pleural effusions, suggestive of congestive heart failure. Electronically Signed   By: Titus Dubin M.D.   On: 05/06/2022 09:53    Procedures Procedures   CRITICAL CARE Performed by: Charlesetta Shanks   Total critical care time: 45 minutes  Critical care time was exclusive of separately billable procedures and treating other patients.  Critical care was necessary to treat or prevent imminent or life-threatening deterioration.  Critical care was time spent personally by me on the following activities: development of treatment plan with patient and/or surrogate as well as nursing, discussions with consultants, evaluation of patient's response to treatment, examination of patient, obtaining history from patient or surrogate, ordering and performing treatments and interventions, ordering and review of laboratory studies, ordering and review of radiographic studies, pulse oximetry and re-evaluation of patient's condition.  Medications Ordered in ED Medications  cefTRIAXone (ROCEPHIN) 2 g in sodium chloride 0.9 % 100 mL IVPB (2 g Intravenous New Bag/Given 05/06/22 1321)    ED Course/ Medical Decision Making/ A&P                           Medical Decision Making Amount and/or Complexity of Data Reviewed Labs: ordered. Radiology: ordered.  Risk Decision regarding hospitalization.  Patient presents with early dementia and significantly worsening symptoms of hallucination and agitation over  the past 24 to 48 hours.  Patient is pleasant and alert but exhibiting symptoms of mild delirium.  Wide differential diagnosis.  Patient was recently started on Seroquel, possibility of new medication reaction, acute infectious etiology, ACS, intracranial injury\CVA.  Broad diagnostic evaluation initiated including CT head and EKG\troponin\BMP\metabolic panel\counts and urinalysis.  Patient's heart rate and blood pressure have remained stable.  He has not exhibited hypotension or sepsis pathology at this time.  Concerning for early sepsis with  soft blood pressures and mild tachypnea.  Urinalysis does return grossly positive.  Rocephin initiated.  BNP elevated and chest x-ray shows mild to moderate vascular congestion.  I have personally reviewed and examined two-view chest x-ray.  Signs of mild vascular congestion but not significant fluffy infiltrate or large pleural effusion.    At this time with stable blood pressure will not initiate sepsis oriented volume resuscitation in light of possible concurrent CHF exacerbation.  Likely patient is not showing signs of hypoperfusion.  He is alert and without acute respiratory distress.  Patient does have critical medical illness and will require admission.  Consult: Reviewed with Dr. Marylyn Ishihara for admission.  Plan for admission and diagnostic results reviewed with the patient's wife and daughter at bedside.        Final Clinical Impression(s) / ED Diagnoses Final diagnoses:  SIRS (systemic inflammatory response syndrome) (HCC)  Acute cystitis without hematuria  Hallucinations  Dementia without behavioral disturbance, psychotic disturbance, mood disturbance, or anxiety, unspecified dementia severity, unspecified dementia type [facility])    Rx / DC Orders ED Discharge Orders     None         Charlesetta Shanks, MD 05/06/22 1338

## 2022-05-06 NOTE — ED Notes (Signed)
Patient found by Brie, EMT in restroom after patient had unwitnessed fall. Patient alert but confused. When asked about the fall, patient stated he tried to get up from the toilet and fell on his knee. He denies hitting head. This RN, Brie & Shaminece, NT assisted patient back to room. Charge RN and Dr. Marylyn Ishihara notified.

## 2022-05-07 ENCOUNTER — Inpatient Hospital Stay (HOSPITAL_COMMUNITY): Payer: Medicare Other

## 2022-05-07 DIAGNOSIS — I5032 Chronic diastolic (congestive) heart failure: Secondary | ICD-10-CM

## 2022-05-07 DIAGNOSIS — R778 Other specified abnormalities of plasma proteins: Secondary | ICD-10-CM | POA: Diagnosis not present

## 2022-05-07 DIAGNOSIS — R609 Edema, unspecified: Secondary | ICD-10-CM | POA: Diagnosis not present

## 2022-05-07 DIAGNOSIS — G9341 Metabolic encephalopathy: Secondary | ICD-10-CM | POA: Diagnosis not present

## 2022-05-07 LAB — T3, FREE: T3, Free: 5 pg/mL — ABNORMAL HIGH (ref 2.0–4.4)

## 2022-05-07 LAB — ECHOCARDIOGRAM COMPLETE
Calc EF: 22.4 %
Height: 74 in
MV M vel: 5.04 m/s
MV Peak grad: 101.6 mmHg
P 1/2 time: 454 msec
Radius: 0.7 cm
S' Lateral: 6.2 cm
Single Plane A2C EF: 27.1 %
Single Plane A4C EF: 20.3 %
Weight: 4289.27 oz

## 2022-05-07 LAB — COMPREHENSIVE METABOLIC PANEL
ALT: 14 U/L (ref 0–44)
AST: 15 U/L (ref 15–41)
Albumin: 3.1 g/dL — ABNORMAL LOW (ref 3.5–5.0)
Alkaline Phosphatase: 66 U/L (ref 38–126)
Anion gap: 11 (ref 5–15)
BUN: 28 mg/dL — ABNORMAL HIGH (ref 8–23)
CO2: 26 mmol/L (ref 22–32)
Calcium: 9 mg/dL (ref 8.9–10.3)
Chloride: 106 mmol/L (ref 98–111)
Creatinine, Ser: 1.51 mg/dL — ABNORMAL HIGH (ref 0.61–1.24)
GFR, Estimated: 48 mL/min — ABNORMAL LOW (ref >60)
Glucose, Bld: 94 mg/dL (ref 70–99)
Potassium: 3.7 mmol/L (ref 3.5–5.1)
Sodium: 143 mmol/L (ref 135–145)
Total Bilirubin: 0.9 mg/dL (ref 0.3–1.2)
Total Protein: 6.2 g/dL — ABNORMAL LOW (ref 6.5–8.1)

## 2022-05-07 LAB — GLUCOSE, CAPILLARY
Glucose-Capillary: 79 mg/dL (ref 70–99)
Glucose-Capillary: 85 mg/dL (ref 70–99)
Glucose-Capillary: 115 mg/dL — ABNORMAL HIGH (ref 70–99)
Glucose-Capillary: 99 mg/dL (ref 70–99)

## 2022-05-07 LAB — MAGNESIUM: Magnesium: 1.9 mg/dL (ref 1.7–2.4)

## 2022-05-07 LAB — CBC
HCT: 45.8 % (ref 39.0–52.0)
Hemoglobin: 14.4 g/dL (ref 13.0–17.0)
MCH: 29.1 pg (ref 26.0–34.0)
MCHC: 31.4 g/dL (ref 30.0–36.0)
MCV: 92.5 fL (ref 80.0–100.0)
Platelets: 266 10*3/uL (ref 150–400)
RBC: 4.95 MIL/uL (ref 4.22–5.81)
RDW: 15.6 % — ABNORMAL HIGH (ref 11.5–15.5)
WBC: 7.5 10*3/uL (ref 4.0–10.5)
nRBC: 0 % (ref 0.0–0.2)

## 2022-05-07 MED ORDER — POTASSIUM CHLORIDE CRYS ER 20 MEQ PO TBCR: 40.0000 meq | EXTENDED_RELEASE_TABLET | Freq: Four times a day (QID) | ORAL | Status: DC

## 2022-05-07 MED ORDER — PERFLUTREN LIPID MICROSPHERE
1.0000 mL | INTRAVENOUS | Status: AC | PRN
  Administered 2022-05-07: 8 mL via INTRAVENOUS

## 2022-05-07 MED ORDER — POTASSIUM CHLORIDE CRYS ER 20 MEQ PO TBCR
40.0000 meq | EXTENDED_RELEASE_TABLET | Freq: Once | ORAL | Status: AC
  Administered 2022-05-07: 40 meq via ORAL
  Filled 2022-05-07: qty 2

## 2022-05-07 NOTE — Assessment & Plan Note (Signed)
With hallucinations in the setting of dementia and UTI. Monitor. Continue to hold seroquel.

## 2022-05-07 NOTE — Assessment & Plan Note (Signed)
Noted. Doppler is negative for DVT. The patient is not able to cooperate with VQ scan given his current mental state. The patient's creatinine makes CTA chest likely to cause worsening of his renal insufficiency.

## 2022-05-07 NOTE — Assessment & Plan Note (Signed)
The patient is normotensive on metoprolol, spironolactone, and lasix. Monitor.

## 2022-05-07 NOTE — Assessment & Plan Note (Signed)
The patient takes only metformin at home. Will follow glucoses with FSBS and SSI at home.

## 2022-05-07 NOTE — Assessment & Plan Note (Addendum)
Echocardiogram demonstrates EF 25-30%. With global hypokinesis of left ventricle with Grade III diastolic dysfunction. There is also mildly reduced function of the right ventricle which is mildly enlarged. The left atrium is severely dilated. Right atrium is moderately dilated.   The patient will be continued on lasix, metoprolol, and spironolactone.  He will be monitored on telemetry.

## 2022-05-07 NOTE — Progress Notes (Signed)
PROGRESS NOTE  [name] TMH:[id number] DOB: 02-28-1946 DOA: 05/06/2022 PCP: [name], MD  Brief History   [name] is a 76 y.o. male with medical history significant of PAF, HLD, DM2, chronic HFrEF, dementia. Presenting with altered mental status. History is per daughter at bedside. She reports that the patient has had increased confusion over the last several days. He's been hallucinating about objects that are not present. They have been working with their PCP about his progressing dementia for some time now. He was recently started on seroquel a couple days ago.  He seems to have worsened in that time. He has not complained of dyspnea, dysuria, fevers, N/V/D. When his symptoms didn't improve today, his family decided to bring him to the ED for assistance.    Review of Systems: As mentioned in the history of present illness. All other systems reviewed and are negative.  Consultants  None  Procedures  None  Antibiotics   Anti-infectives (From admission, onward)    Start     Dose/Rate Route Frequency Ordered Stop   05/06/22 1315  cefTRIAXone (ROCEPHIN) 2 g in sodium chloride 0.9 % 100 mL IVPB        2 g 200 mL/hr over 30 Minutes Intravenous Every 24 hours 05/06/22 1307        Subjective  The patient is resting quietly. No new complaints.  Objective   Vitals:  Vitals:   05/07/22 0556 05/07/22 1326  BP: 126/80 119/66  Pulse: 86 (!) 52  Resp:  18  Temp: 97.6 F (36.4 C) 98.1 F (36.7 C)  SpO2: 93% 100%    Exam:  Constitutional:  Appears calm and comfortable Eyes:  pupils and irises appear normal Normal lids and conjunctivae ENMT:  grossly normal hearing  Lips appear normal external ears, nose appear normal Oropharynx: mucosa, tongue,posterior pharynx appear normal Neck:  neck appears normal, no masses, normal ROM, supple no thyromegaly Respiratory:  CTA bilaterally, no w/r/r.  Respiratory effort normal. No retractions or accessory  muscle use Cardiovascular:  RRR, no m/r/g No LE extremity edema   Normal pedal pulses Abdomen:  Abdomen appears normal; no tenderness or masses No hernias No HSM Musculoskeletal:  Digits/nails BUE: no clubbing, cyanosis, petechiae, infection exam of joints, bones, muscles of at least one of following: head/neck, RUE, LUE, RLE, LLE   strength and tone normal, no atrophy, no abnormal movements No tenderness, masses Normal ROM, no contractures  gait and station Skin:  No rashes, lesions, ulcers palpation of skin: no induration or nodules Neurologic:  CN 2-12 intact Sensation all 4 extremities intact Psychiatric:  Mental status Mood, affect appropriate Orientation to person, place, time  judgment and insight appear intact     I have personally reviewed the following:   Today's Data  Vitals  Lab Data  CBC BMP  Micro Data    Imaging  CT head  Cardiology Data  CHF  Other Data    Scheduled Meds:  amiodarone  200 mg Oral Daily   apixaban  5 mg Oral BID   atorvastatin  20 mg Oral Daily   feeding supplement  237 mL Oral BID BM   FLUoxetine  40 mg Oral Daily   furosemide  40 mg Oral Daily   insulin aspart  0-15 Units Subcutaneous TID WC   insulin aspart  0-5 Units Subcutaneous QHS   melatonin  5 mg Oral QHS   metoprolol  200 mg Oral Daily   multivitamin with minerals  1  tablet Oral Daily   spironolactone  25 mg Oral Daily   Continuous Infusions:  cefTRIAXone (ROCEPHIN)  IV 2 g (05/07/22 1514)    Principal Problem:   Acute metabolic encephalopathy Active Problems:   Essential hypertension   HLD (hyperlipidemia)   DM2 (diabetes mellitus, type 2) (HCC)   Chronic HFrEF (heart failure with reduced ejection fraction) (HCC)   PAF (paroxysmal atrial fibrillation) (HCC)   Dementia with behavioral disturbance (HCC)   Stage 3a chronic kidney disease (CKD) (HCC)   Elevated troponin   Low TSH level   Elevated d-dimer   LOS: 1 day  Assessment and Plan: *  Acute metabolic encephalopathy With hallucinations in the setting of dementia and UTI. Monitor. Continue to hold seroquel.   Elevated d-dimer Noted. Doppler is negative for DVT. The patient is not able to cooperate with VQ scan given his current mental state. The patient's creatinine makes CTA chest likely to cause worsening of his renal insufficiency.  Elevated troponin Noted. Likely related to renal insufficiency.  Dementia with behavioral disturbance ([place]) The patient was recently started on Seroquel. He began having hallucinations soon there after. This has been held.   Chronic HFrEF (heart failure with reduced ejection fraction) (HCC) Echocardiogram demonstrates EF 25-30%. With global hypokinesis of left ventricle with Grade III diastolic dysfunction. There is also mildly reduced function of the right ventricle which is mildly enlarged. The left atrium is severely dilated. Right atrium is moderately dilated.   The patient will be continued on lasix, metoprolol, and spironolactone.  He will be monitored on telemetry.  DM2 (diabetes mellitus, type 2) ([place]) The patient takes only metformin at home. Will follow glucoses with FSBS and SSI at home.   Essential hypertension The patient is normotensive on metoprolol, spironolactone, and lasix. Monitor.  I have seen and examined this patient myself. I have spent 34 minutes in his evaluation and care.  DVT prophylaxis: Lovenox Code Status: Full Code Family Communication: Family at bedside Disposition Plan: home    Latima Hamza, DO [facility] Direct contact: see [email or web address]  7PM-7AM contact night coverage as above 05/07/2022, 6:28 PM  LOS: 1 day

## 2022-05-07 NOTE — Progress Notes (Signed)
Notified by RN patient had 32 beats on NSVT, he is alreasy on BB, 2D echo pending to check EF, replete potassium to keep >4, will check magnesium with morining labs. Phillips Climes MD

## 2022-05-07 NOTE — Assessment & Plan Note (Signed)
The patient was recently started on Seroquel. He began having hallucinations soon there after. This has been held.

## 2022-05-07 NOTE — Assessment & Plan Note (Signed)
Noted. Likely related to renal insufficiency.

## 2022-05-07 NOTE — Progress Notes (Signed)
Bilateral LE venous duplex study completed. Please see CV Proc for preliminary results.  Mardie Kellen BS, RVT 05/07/2022 9:32 AM

## 2022-05-08 DIAGNOSIS — E0590 Thyrotoxicosis, unspecified without thyrotoxic crisis or storm: Secondary | ICD-10-CM | POA: Diagnosis present

## 2022-05-08 DIAGNOSIS — G9341 Metabolic encephalopathy: Secondary | ICD-10-CM | POA: Diagnosis not present

## 2022-05-08 LAB — GLUCOSE, CAPILLARY
Glucose-Capillary: 93 mg/dL (ref 70–99)
Glucose-Capillary: 112 mg/dL — ABNORMAL HIGH (ref 70–99)
Glucose-Capillary: 115 mg/dL — ABNORMAL HIGH (ref 70–99)
Glucose-Capillary: 118 mg/dL — ABNORMAL HIGH (ref 70–99)

## 2022-05-08 LAB — URINE CULTURE: Culture: 30000 — AB

## 2022-05-08 LAB — CBC WITH DIFFERENTIAL/PLATELET
Abs Immature Granulocytes: 0.01 10*3/uL (ref 0.00–0.07)
Basophils Absolute: 0 10*3/uL (ref 0.0–0.1)
Basophils Relative: 0 %
Eosinophils Absolute: 0.1 10*3/uL (ref 0.0–0.5)
Eosinophils Relative: 2 %
HCT: 46.5 % (ref 39.0–52.0)
Hemoglobin: 14.7 g/dL (ref 13.0–17.0)
Immature Granulocytes: 0 %
Lymphocytes Relative: 12 %
Lymphs Abs: 1 10*3/uL (ref 0.7–4.0)
MCH: 28.8 pg (ref 26.0–34.0)
MCHC: 31.6 g/dL (ref 30.0–36.0)
MCV: 91.2 fL (ref 80.0–100.0)
Monocytes Absolute: 0.7 10*3/uL (ref 0.1–1.0)
Monocytes Relative: 9 %
Neutro Abs: 5.9 10*3/uL (ref 1.7–7.7)
Neutrophils Relative %: 77 %
Platelets: 273 10*3/uL (ref 150–400)
RBC: 5.1 MIL/uL (ref 4.22–5.81)
RDW: 15.3 % (ref 11.5–15.5)
WBC: 7.7 10*3/uL (ref 4.0–10.5)
nRBC: 0 % (ref 0.0–0.2)

## 2022-05-08 LAB — BASIC METABOLIC PANEL
Anion gap: 11 (ref 5–15)
BUN: 27 mg/dL — ABNORMAL HIGH (ref 8–23)
CO2: 25 mmol/L (ref 22–32)
Calcium: 9.2 mg/dL (ref 8.9–10.3)
Chloride: 108 mmol/L (ref 98–111)
Creatinine, Ser: 1.45 mg/dL — ABNORMAL HIGH (ref 0.61–1.24)
GFR, Estimated: 50 mL/min — ABNORMAL LOW (ref >60)
Glucose, Bld: 95 mg/dL (ref 70–99)
Potassium: 4 mmol/L (ref 3.5–5.1)
Sodium: 144 mmol/L (ref 135–145)

## 2022-05-08 MED ORDER — METHIMAZOLE 10 MG PO TABS
10.0000 mg | ORAL_TABLET | Freq: Three times a day (TID) | ORAL | Status: DC
  Administered 2022-05-08 – 2022-05-23 (×38): 10 mg via ORAL
  Filled 2022-05-08 (×48): qty 1

## 2022-05-08 MED ORDER — PROPRANOLOL HCL 10 MG PO TABS
10.0000 mg | ORAL_TABLET | Freq: Three times a day (TID) | ORAL | Status: DC
  Administered 2022-05-08 – 2022-05-09 (×4): 10 mg via ORAL
  Filled 2022-05-08 (×4): qty 1

## 2022-05-08 MED ORDER — LORAZEPAM 0.5 MG PO TABS
0.5000 mg | ORAL_TABLET | Freq: Four times a day (QID) | ORAL | Status: DC | PRN
  Administered 2022-05-08 – 2022-05-10 (×5): 1 mg via ORAL
  Filled 2022-05-08 (×5): qty 2

## 2022-05-08 NOTE — Assessment & Plan Note (Addendum)
The patient's TSH upon admission was low at <0.010. FT3 was elevated at 5. Ft4 was elevated at 4.01. This may very wewll be the cause of the patient's altered mental status and hallucinations. He has been started on methimazole and propranolol. Will monitor for improvement. Amiodarone has also been stopped. Will consult cardiology for AF with RVR, should it develop.

## 2022-05-08 NOTE — Assessment & Plan Note (Signed)
Continue lipitor as at home.

## 2022-05-08 NOTE — Progress Notes (Signed)
Initial Nutrition Assessment  DOCUMENTATION CODES:   Obesity unspecified  INTERVENTION:  Liberalize diet from a carb modified to a regular diet to provide widest variety of menu options to enhance nutritional adequacy  Ensure Enlive po BID, each supplement provides 350 kcal and 20 grams of protein.  NUTRITION DIAGNOSIS:   Increased nutrient needs related to acute illness as evidenced by estimated needs.  GOAL:   Patient will meet greater than or equal to 90% of their needs  MONITOR:   PO intake, Supplement acceptance, Labs, Weight trends  REASON FOR ASSESSMENT:   Malnutrition Screening Tool    ASSESSMENT:   Pt admitted with hallucinations r/t dementia and UTI. PMH significant for PAF, HLD, T2DM, chronic CHF, and dementia.  Unsuccessful attempt to reach pt via phone call to room.   Meal completions: 7/22: 0%-breakfast, 50%-lunch, 80%-dinner  Noted Ensure nutrition supplements ordered. Per MAR, pt received 1 yesterday. Will continue to offer these and adjust as appropriate.   Per flowsheet documentation, MST documentation reports pt lost approximately 100 lbs over the last 18 months.  Reviewed wt history. Unable to confirm this wt loss. Does not appear he has had significant wt loss within the last 6 months. Documented wt on 1/20 was 122.5 kg and current wt noted to be 121.3 kg. However since 04/17/2020, pt is down 32.3 kg.    CBG's have been well controlled throughout admission. Last HgbA1c (06/2021) was 5.4%. Given good diabetes control and increased nutritional needs, will liberalize diet for now. Can consider adjusting diet if blood sugars become elevated and once intake improves.  Medications: lasix, SSI 0-15 units TID, SSI 0-5 units qhs, melatonin, MVI, IV abx  Labs: Cr 1.45, GFR 50, CBG's 85-115 x24 hours  NUTRITION - FOCUSED PHYSICAL EXAM: RD working remotely. Deferred to follow up.   Diet Order:   Diet Order             Diet regular Room service appropriate?  Yes; Fluid consistency: Thin  Diet effective now                   EDUCATION NEEDS:   No education needs have been identified at this time  Skin:  Skin Assessment: Reviewed RN Assessment  Last BM:  PTA  Height:   Ht Readings from Last 1 Encounters:  05/06/22 '6\' 2"'$  (1.88 m)    Weight:   Wt Readings from Last 1 Encounters:  05/08/22 121.3 kg    Ideal Body Weight:  86.4 kg  BMI:  Body mass index is 34.33 kg/m.  Estimated Nutritional Needs:   Kcal:  2100-2300  Protein:  105-120g  Fluid:  >/=2.1L  Clayborne Dana, RDN, LDN Clinical Nutrition

## 2022-05-08 NOTE — Progress Notes (Signed)
PROGRESS NOTE  [name] CBJ:[id number] DOB: 1946/03/08 DOA: 05/06/2022 PCP: [name], MD  Brief History   [name] is a 76 y.o. male with medical history significant of PAF, HLD, DM2, chronic HFrEF, dementia. Presenting with altered mental status. History is per daughter at bedside. She reports that the patient has had increased confusion over the last several days. He's been hallucinating about objects that are not present. They have been working with their PCP about his progressing dementia for some time now. He was recently started on seroquel a couple days ago.  He seems to have worsened in that time. He has not complained of dyspnea, dysuria, fevers, N/V/D. When his symptoms didn't improve today, his family decided to bring him to the ED for assistance.    Review of Systems: As mentioned in the history of present illness. All other systems reviewed and are negative.  The patient's seroquel has been stopped as his symptoms began within days of starting this medication.  TSH has returned low (<0.010). FT4 and FT3 are both elevated. Amiodarone has been stopped. The patient has been started on methimazole and propranolol. Hyperthyroidism may be the cause for the patient's ongoing mental status changes.  Consultants  None  Procedures  None  Antibiotics   Anti-infectives (From admission, onward)    Start     Dose/Rate Route Frequency Ordered Stop   05/06/22 1315  cefTRIAXone (ROCEPHIN) 2 g in sodium chloride 0.9 % 100 mL IVPB        2 g 200 mL/hr over 30 Minutes Intravenous Every 24 hours 05/06/22 1307        Subjective  The patient is awake. No new complaints. Sitter states that the patient has been very agitated. He is at times argumentative. Sometimes he "meows".  Objective   Vitals:  Vitals:   05/08/22 0522 05/08/22 1403  BP: 131/82 127/75  Pulse: 60 63  Resp:  20  Temp: 97.9 F (36.6 C) 98.3 F (36.8 C)  SpO2: 97% 95%     Exam:  Constitutional:  The patient is awake, alert, agitated and very confused. No acute distress. Respiratory:  No increased work of breathing. No wheezes, rales, or rhonchi No tactile fremitus Cardiovascular:  Regular rate and rhythm No murmurs, ectopy, or gallups. No lateral PMI. No thrills. Abdomen:  Abdomen is soft, non-tender, non-distended No hernias, masses, or organomegaly Normoactive bowel sounds.  Musculoskeletal:  No cyanosis, clubbing, or edema Skin:  No rashes, lesions, ulcers palpation of skin: no induration or nodules Neurologic:  Moving all extremities. Psychiatric:  Mental status The patient is agitated and confused. Bizarre behavior. I have personally reviewed the following:   Today's Data  Vitals  Lab Data  CBC BMP  Micro Data    Imaging  CT head  Cardiology Data  CHF  Other Data    Scheduled Meds:  apixaban  5 mg Oral BID   atorvastatin  20 mg Oral Daily   feeding supplement  237 mL Oral BID BM   FLUoxetine  40 mg Oral Daily   furosemide  40 mg Oral Daily   insulin aspart  0-15 Units Subcutaneous TID WC   insulin aspart  0-5 Units Subcutaneous QHS   melatonin  5 mg Oral QHS   methimazole  10 mg Oral TID   metoprolol  200 mg Oral Daily   multivitamin with minerals  1 tablet Oral Daily   propranolol  10 mg Oral TID   spironolactone  25 mg  Oral Daily   Continuous Infusions:  cefTRIAXone (ROCEPHIN)  IV 2 g (05/08/22 1319)    Principal Problem:   Hyperthyroidism Active Problems:   Essential hypertension   HLD (hyperlipidemia)   DM2 (diabetes mellitus, type 2) (HCC)   Chronic HFrEF (heart failure with reduced ejection fraction) (HCC)   PAF (paroxysmal atrial fibrillation) (HCC)   Acute metabolic encephalopathy   Dementia with behavioral disturbance (HCC)   Stage 3a chronic kidney disease (CKD) (HCC)   Elevated troponin   Elevated d-dimer   LOS: 2 days  Assessment and Plan: * Hyperthyroidism The patient's TSH upon  admission was low at <0.010. FT3 was elevated at 5. Ft4 was elevated at 4.01. This may very wewll be the cause of the patient's altered mental status and hallucinations. He has been started on methimazole and propranolol. Will monitor for improvement. Amiodarone has also been stopped. Will consult cardiology for AF with RVR.  Elevated d-dimer Noted. Doppler is negative for DVT. The patient is not able to cooperate with VQ scan given his current mental state. The patient's creatinine makes CTA chest likely to cause worsening of his renal insufficiency.  Elevated troponin Noted. Likely related to renal insufficiency.  Dementia with behavioral disturbance ([place]) The patient was recently started on Seroquel. He began having hallucinations soon there after. This has been held.   Acute metabolic encephalopathy Likely due to new diagnosis of hyperthyroidism. Amiodarone has been stopped. The patient has been started on methimazole and propranolol. With hallucinations in the setting of dementia and UTI. Monitor. Continue to hold seroquel.   Chronic HFrEF (heart failure with reduced ejection fraction) (HCC) Echocardiogram demonstrates EF 25-30%. With global hypokinesis of left ventricle with Grade III diastolic dysfunction. There is also mildly reduced function of the right ventricle which is mildly enlarged. The left atrium is severely dilated. Right atrium is moderately dilated.   The patient will be continued on lasix, metoprolol, and spironolactone.  He will be monitored on telemetry.  DM2 (diabetes mellitus, type 2) ([place]) The patient takes only metformin at home. Will follow glucoses with FSBS and SSI at home.   HLD (hyperlipidemia) Continue lipitor as at home.  Essential hypertension The patient is normotensive on metoprolol, spironolactone, and lasix. Monitor.  I have seen and examined this patient myself. I have spent 32 minutes in his evaluation and care.  DVT prophylaxis: Lovenox Code  Status: Full Code Family Communication: Family at bedside Disposition Plan: home    [name] Schetter, DO [facility] Direct contact: see [email or web address]  7PM-7AM contact night coverage as above 05/08/2022, 7:04 PM  LOS: 1 day

## 2022-05-09 ENCOUNTER — Other Ambulatory Visit: Payer: Self-pay

## 2022-05-09 DIAGNOSIS — G9341 Metabolic encephalopathy: Secondary | ICD-10-CM | POA: Diagnosis not present

## 2022-05-09 LAB — CBC WITH DIFFERENTIAL/PLATELET
Abs Immature Granulocytes: 0.03 10*3/uL (ref 0.00–0.07)
Basophils Absolute: 0 10*3/uL (ref 0.0–0.1)
Basophils Relative: 0 %
Eosinophils Absolute: 0.1 10*3/uL (ref 0.0–0.5)
Eosinophils Relative: 1 %
HCT: 46 % (ref 39.0–52.0)
Hemoglobin: 14.5 g/dL (ref 13.0–17.0)
Immature Granulocytes: 0 %
Lymphocytes Relative: 11 %
Lymphs Abs: 0.9 10*3/uL (ref 0.7–4.0)
MCH: 28.7 pg (ref 26.0–34.0)
MCHC: 31.5 g/dL (ref 30.0–36.0)
MCV: 90.9 fL (ref 80.0–100.0)
Monocytes Absolute: 0.8 10*3/uL (ref 0.1–1.0)
Monocytes Relative: 9 %
Neutro Abs: 6.7 10*3/uL (ref 1.7–7.7)
Neutrophils Relative %: 79 %
Platelets: 298 10*3/uL (ref 150–400)
RBC: 5.06 MIL/uL (ref 4.22–5.81)
RDW: 15.5 % (ref 11.5–15.5)
WBC: 8.5 10*3/uL (ref 4.0–10.5)
nRBC: 0 % (ref 0.0–0.2)

## 2022-05-09 LAB — BASIC METABOLIC PANEL
Anion gap: 10 (ref 5–15)
BUN: 27 mg/dL — ABNORMAL HIGH (ref 8–23)
CO2: 23 mmol/L (ref 22–32)
Calcium: 9.1 mg/dL (ref 8.9–10.3)
Chloride: 108 mmol/L (ref 98–111)
Creatinine, Ser: 1.52 mg/dL — ABNORMAL HIGH (ref 0.61–1.24)
GFR, Estimated: 47 mL/min — ABNORMAL LOW (ref >60)
Glucose, Bld: 138 mg/dL — ABNORMAL HIGH (ref 70–99)
Potassium: 3.7 mmol/L (ref 3.5–5.1)
Sodium: 141 mmol/L (ref 135–145)

## 2022-05-09 LAB — GLUCOSE, CAPILLARY
Glucose-Capillary: 140 mg/dL — ABNORMAL HIGH (ref 70–99)
Glucose-Capillary: 114 mg/dL — ABNORMAL HIGH (ref 70–99)
Glucose-Capillary: 99 mg/dL (ref 70–99)
Glucose-Capillary: 116 mg/dL — ABNORMAL HIGH (ref 70–99)

## 2022-05-09 MED ORDER — SODIUM CHLORIDE 0.9 % IV SOLN
1.0000 g | Freq: Four times a day (QID) | INTRAVENOUS | Status: DC
  Administered 2022-05-09 – 2022-05-10 (×4): 1 g via INTRAVENOUS
  Filled 2022-05-09 (×5): qty 1000

## 2022-05-09 NOTE — Progress Notes (Signed)
PROGRESS NOTE  [name] ATF:[id number] DOB: 07-16-1946 DOA: 05/06/2022 PCP: [name], MD  Brief History   [name] is a 76 y.o. male with medical history significant of PAF, HLD, DM2, chronic HFrEF, dementia. Presenting with altered mental status. History is per daughter at bedside. She reports that the patient has had increased confusion over the last several days. He's been hallucinating about objects that are not present. They have been working with their PCP about his progressing dementia for some time now. He was recently started on seroquel a couple days ago.  He seems to have worsened in that time. He has not complained of dyspnea, dysuria, fevers, N/V/D. When his symptoms didn't improve today, his family decided to bring him to the ED for assistance.    Review of Systems: As mentioned in the history of present illness. All other systems reviewed and are negative.  The patient's seroquel has been stopped as his symptoms began within days of starting this medication.  TSH has returned low (<0.010). FT4 and FT3 are both elevated. Amiodarone has been stopped. The patient has been started on methimazole and propranolol. Hyperthyroidism may be the cause for the patient's ongoing mental status changes.  Consultants  None  Procedures  None  Antibiotics   Anti-infectives (From admission, onward)    Start     Dose/Rate Route Frequency Ordered Stop   05/09/22 1200  ampicillin (OMNIPEN) 1 g in sodium chloride 0.9 % 100 mL IVPB        1 g 300 mL/hr over 20 Minutes Intravenous Every 6 hours 05/09/22 0932     05/06/22 1315  cefTRIAXone (ROCEPHIN) 2 g in sodium chloride 0.9 % 100 mL IVPB  Status:  Discontinued        2 g 200 mL/hr over 30 Minutes Intravenous Every 24 hours 05/06/22 1307 05/09/22 0932      Subjective  The patient is awake. No new complaints. He does not seem much changed. No new complaints.  Objective   Vitals:  Vitals:   05/09/22 0354 05/09/22  1424  BP: (!) 130/91 (!) 115/91  Pulse: 66 (!) 58  Resp: 20 20  Temp: (!) 97.4 F (36.3 C) (!) 97.4 F (36.3 C)  SpO2: 95% 96%    Exam:  Constitutional:  The patient is awake, alert, and not oriented. He does seem less agitated. No acute distress. He still requires a Actuary. Respiratory:  No increased work of breathing. No wheezes, rales, or rhonchi No tactile fremitus Cardiovascular:  Regular rate and rhythm No murmurs, ectopy, or gallups. No lateral PMI. No thrills. Abdomen:  Abdomen is soft, non-tender, non-distended No hernias, masses, or organomegaly Normoactive bowel sounds.  Musculoskeletal:  No cyanosis, clubbing, or edema Skin:  No rashes, lesions, ulcers palpation of skin: no induration or nodules Neurologic:  Moving all extremities. Psychiatric:  Mental status The patient is agitated and confused. Bizarre behavior. I have personally reviewed the following:   Today's Data  Vitals  Lab Data  CBC BMP  Micro Data    Imaging  CT head  Cardiology Data  CHF  Other Data    Scheduled Meds:  apixaban  5 mg Oral BID   atorvastatin  20 mg Oral Daily   feeding supplement  237 mL Oral BID BM   FLUoxetine  40 mg Oral Daily   furosemide  40 mg Oral Daily   insulin aspart  0-15 Units Subcutaneous TID WC   insulin aspart  0-5 Units Subcutaneous  QHS   melatonin  5 mg Oral QHS   methimazole  10 mg Oral TID   metoprolol  200 mg Oral Daily   multivitamin with minerals  1 tablet Oral Daily   propranolol  10 mg Oral TID   spironolactone  25 mg Oral Daily   Continuous Infusions:  ampicillin (OMNIPEN) IV 1 g (05/09/22 1745)    Principal Problem:   Hyperthyroidism Active Problems:   Essential hypertension   HLD (hyperlipidemia)   DM2 (diabetes mellitus, type 2) (HCC)   Chronic HFrEF (heart failure with reduced ejection fraction) (HCC)   PAF (paroxysmal atrial fibrillation) (HCC)   Acute metabolic encephalopathy   Dementia with behavioral  disturbance (HCC)   Stage 3a chronic kidney disease (CKD) (HCC)   Elevated troponin   Elevated d-dimer   LOS: 3 days  Assessment and Plan: * Hyperthyroidism The patient's TSH upon admission was low at <0.010. FT3 was elevated at 5. Ft4 was elevated at 4.01. This may very wewll be the cause of the patient's altered mental status and hallucinations. He has been started on methimazole and propranolol. Will monitor for improvement. Amiodarone has also been stopped. Will consult cardiology for AF with RVR, should it develop.  Elevated d-dimer Noted. Doppler is negative for DVT. The patient is not able to cooperate with VQ scan given his current mental state. The patient's creatinine makes CTA chest likely to cause worsening of his renal insufficiency.  Elevated troponin Noted. Likely related to renal insufficiency.  Dementia with behavioral disturbance ([place]) The patient was recently started on Seroquel. He began having hallucinations soon there after. This has been held.   Acute metabolic encephalopathy Likely due to new diagnosis of hyperthyroidism. Amiodarone has been stopped. The patient has been started on methimazole and propranolol. With hallucinations in the setting of dementia and UTI. Monitor. Continue to hold seroquel.   Chronic HFrEF (heart failure with reduced ejection fraction) (HCC) Echocardiogram demonstrates EF 25-30%. With global hypokinesis of left ventricle with Grade III diastolic dysfunction. There is also mildly reduced function of the right ventricle which is mildly enlarged. The left atrium is severely dilated. Right atrium is moderately dilated.   The patient will be continued on lasix, metoprolol, and spironolactone.  He will be monitored on telemetry.  DM2 (diabetes mellitus, type 2) ([place]) The patient takes only metformin at home. Will follow glucoses with FSBS and SSI at home.   HLD (hyperlipidemia) Continue lipitor as at home.  Essential hypertension The  patient is normotensive on metoprolol, spironolactone, and lasix. Monitor.  I have seen and examined this patient myself. I have spent 32 minutes in his evaluation and care.  DVT prophylaxis: Lovenox Code Status: Full Code Family Communication: Family at bedside Disposition Plan: home    Mairin Lindsley, DO [facility] Direct contact: see [email or web address]  7PM-7AM contact night coverage as above 05/09/2022, 7:14 PM  LOS: 1 day

## 2022-05-10 ENCOUNTER — Encounter: Payer: Self-pay | Admitting: Physician Assistant

## 2022-05-10 ENCOUNTER — Other Ambulatory Visit: Payer: Self-pay

## 2022-05-10 ENCOUNTER — Inpatient Hospital Stay (HOSPITAL_COMMUNITY): Payer: Medicare Other

## 2022-05-10 DIAGNOSIS — E0590 Thyrotoxicosis, unspecified without thyrotoxic crisis or storm: Secondary | ICD-10-CM

## 2022-05-10 LAB — CBC WITH DIFFERENTIAL/PLATELET
Abs Immature Granulocytes: 0.03 10*3/uL (ref 0.00–0.07)
Basophils Absolute: 0 10*3/uL (ref 0.0–0.1)
Basophils Relative: 0 %
Eosinophils Absolute: 0.1 10*3/uL (ref 0.0–0.5)
Eosinophils Relative: 1 %
HCT: 46.3 % (ref 39.0–52.0)
Hemoglobin: 14.6 g/dL (ref 13.0–17.0)
Immature Granulocytes: 0 %
Lymphocytes Relative: 11 %
Lymphs Abs: 1 10*3/uL (ref 0.7–4.0)
MCH: 28.8 pg (ref 26.0–34.0)
MCHC: 31.5 g/dL (ref 30.0–36.0)
MCV: 91.3 fL (ref 80.0–100.0)
Monocytes Absolute: 0.8 10*3/uL (ref 0.1–1.0)
Monocytes Relative: 9 %
Neutro Abs: 6.8 10*3/uL (ref 1.7–7.7)
Neutrophils Relative %: 79 %
Platelets: 253 10*3/uL (ref 150–400)
RBC: 5.07 MIL/uL (ref 4.22–5.81)
RDW: 15.7 % — ABNORMAL HIGH (ref 11.5–15.5)
WBC: 8.7 10*3/uL (ref 4.0–10.5)
nRBC: 0 % (ref 0.0–0.2)

## 2022-05-10 LAB — BASIC METABOLIC PANEL
Anion gap: 10 (ref 5–15)
BUN: 29 mg/dL — ABNORMAL HIGH (ref 8–23)
CO2: 24 mmol/L (ref 22–32)
Calcium: 8.6 mg/dL — ABNORMAL LOW (ref 8.9–10.3)
Chloride: 107 mmol/L (ref 98–111)
Creatinine, Ser: 1.4 mg/dL — ABNORMAL HIGH (ref 0.61–1.24)
GFR, Estimated: 52 mL/min — ABNORMAL LOW (ref >60)
Glucose, Bld: 106 mg/dL — ABNORMAL HIGH (ref 70–99)
Potassium: 3.3 mmol/L — ABNORMAL LOW (ref 3.5–5.1)
Sodium: 141 mmol/L (ref 135–145)

## 2022-05-10 LAB — GLUCOSE, CAPILLARY
Glucose-Capillary: 103 mg/dL — ABNORMAL HIGH (ref 70–99)
Glucose-Capillary: 118 mg/dL — ABNORMAL HIGH (ref 70–99)
Glucose-Capillary: 104 mg/dL — ABNORMAL HIGH (ref 70–99)
Glucose-Capillary: 123 mg/dL — ABNORMAL HIGH (ref 70–99)

## 2022-05-10 MED ORDER — POTASSIUM CHLORIDE CRYS ER 20 MEQ PO TBCR
40.0000 meq | EXTENDED_RELEASE_TABLET | Freq: Two times a day (BID) | ORAL | Status: AC
  Administered 2022-05-10 (×2): 40 meq via ORAL
  Filled 2022-05-10 (×2): qty 2

## 2022-05-10 MED ORDER — ORAL CARE MOUTH RINSE: 15.0000 mL | OROMUCOSAL | Status: DC | PRN

## 2022-05-10 MED ORDER — LORAZEPAM 2 MG/ML IJ SOLN
0.5000 mg | Freq: Once | INTRAMUSCULAR | Status: AC | PRN
Start: 2022-05-10 — End: 2022-05-10
  Administered 2022-05-10: 0.5 mg via INTRAVENOUS
  Filled 2022-05-10: qty 1

## 2022-05-10 NOTE — Progress Notes (Addendum)
PROGRESS NOTE  [name] QBH:[id number] DOB: [birth year] DOA: 05/06/2022 PCP: [name], MD  HPI/Recap of past 24 hours: [name] is a 76 y.o. male with medical history significant of PAF on Eliquis, HLD, DM2, chronic HFrEF, dementia. Presenting with altered mental status. Increased confusion over the last several days. He's been hallucinating about objects that are not present. They have been working with their PCP about his progressing dementia for some time now. He was recently started on seroquel a couple days ago.  He seems to have worsened in that time.  Work-up has revealed hyperthyroidism.  05/10/2022: The patient was seen and examined at his bedside.  He is alert but confused.  Denies having any pain.  Updated his wife via phone.  Assessment/Plan: Principal Problem:   Hyperthyroidism Active Problems:   Essential hypertension   HLD (hyperlipidemia)   DM2 (diabetes mellitus, type 2) (HCC)   Chronic HFrEF (heart failure with reduced ejection fraction) (HCC)   PAF (paroxysmal atrial fibrillation) (HCC)   Acute metabolic encephalopathy   Dementia with behavioral disturbance (HCC)   Stage 3a chronic kidney disease (CKD) (HCC)   Elevated troponin   Elevated d-dimer  * Hyperthyroidism The patient's TSH upon admission was low at <0.010. FT3 was elevated at 5. Ft4 was elevated at 4.01. This may very wewll be the cause of the patient's altered mental status and hallucinations. He has been started on methimazole and propranolol.  Will need to follow up with endocrinology outpatient.   Elevated d-dimer Noted. Doppler is negative for DVT. The patient is not able to cooperate with VQ scan given his current mental state. The patient's creatinine makes CTA chest likely to cause worsening of his renal insufficiency. The patient was started on Eliquis prophylactically on 05/06/2022.   Elevated troponin Noted. Likely related to renal insufficiency.   Dementia with  behavioral disturbance ([place]) The patient was recently started on Seroquel. He began having hallucinations soon there after. This has been held.    Acute metabolic encephalopathy Likely due to new diagnosis of hyperthyroidism. Amiodarone has been stopped. The patient has been started on methimazole and propranolol. With hallucinations in the setting of dementia. Continue to hold seroquel.  Patient not improving mentally, will rule out CVA with MRI brain. Admission CT head was negative.   Chronic combined diastolic and systolic CHF. Echocardiogram demonstrates EF 25-30%. With global hypokinesis of left ventricle with Grade III diastolic dysfunction. There is also mildly reduced function of the right ventricle which is mildly enlarged. The left atrium is severely dilated. Right atrium is moderately dilated.  Currently on p.o. Lasix 40 mg daily.  Spironolactone 25 mg daily. Continue strict I's and O's and daily weight   DM2 (diabetes mellitus, type 2) ([place]) The patient takes only metformin at home. Will follow glucoses with FSBS and SSI at home.    HLD (hyperlipidemia) Continue lipitor as at home.   Essential hypertension BP is at goal. Home metoprolol held due to bradycardia. Currently on spironolactone and p.o. Lasix. Continue to closely monitor vital signs.  Hypokalemia Potassium 3.3 Repleted orally.  Generalized weakness PT OT to assess Fall precautions    DVT prophylaxis: Eliquis Code Status: Full Code Family Communication: Family at bedside Disposition Plan: home         Status is: Inpatient The patient requires at least 2 midnights for further evaluation and treatment of present condition.    Objective: Vitals:   05/09/22 2051 05/10/22 0438 05/10/22 0535 05/10/22 1339  BP: 106/66  130/74 119/84  Pulse: (!) 55  66 69  Resp: '20  20 20  '$ Temp: 97.6 F (36.4 C)  97.6 F (36.4 C) (!) 97.4 F (36.3 C)  TempSrc: Oral  Oral Oral  SpO2: 97%  97% 96%  Weight:   120.3 kg    Height:        Intake/Output Summary (Last 24 hours) at 05/10/2022 1412 Last data filed at 05/10/2022 0603 Gross per 24 hour  Intake 340 ml  Output 325 ml  Net 15 ml   Filed Weights   05/07/22 0648 05/08/22 0500 05/10/22 0438  Weight: 121.6 kg 121.3 kg 120.3 kg    Exam:  General: 76 y.o. year-old male well developed well nourished in no acute distress.  Alert and minimally interactive. Cardiovascular: Regular rate and rhythm with no rubs or gallops.  No thyromegaly or JVD noted.   Respiratory: Clear to auscultation with no wheezes or rales. Good inspiratory effort. Abdomen: Soft nontender nondistended with normal bowel sounds x4 quadrants. Musculoskeletal: Trace lower extremity edema bilaterally. Skin: Bruising involving upper extremities. Psychiatry: Unable to assess mood due to minimal responsiveness.   Data Reviewed: CBC: Recent Labs  Lab 05/06/22 1012 05/07/22 0701 05/08/22 0829 05/09/22 0529 05/10/22 0843  WBC 6.1 7.5 7.7 8.5 8.7  NEUTROABS 4.2  --  5.9 6.7 6.8  HGB 13.6 14.4 14.7 14.5 14.6  HCT 42.4 45.8 46.5 46.0 46.3  MCV 91.0 92.5 91.2 90.9 91.3  PLT 254 266 273 298 676   Basic Metabolic Panel: Recent Labs  Lab 05/06/22 1012 05/07/22 0701 05/08/22 0829 05/09/22 0529 05/10/22 0843  NA 145 143 144 141 141  K 3.6 3.7 4.0 3.7 3.3*  CL 110 106 108 108 107  CO2 '25 26 25 23 24  '$ GLUCOSE 102* 94 95 138* 106*  BUN 26* 28* 27* 27* 29*  CREATININE 1.48* 1.51* 1.45* 1.52* 1.40*  CALCIUM 8.9 9.0 9.2 9.1 8.6*  MG  --  1.9  --   --   --    GFR: Estimated Creatinine Clearance: 62.8 mL/min (A) (by C-G formula based on SCr of 1.4 mg/dL (H)). Liver Function Tests: Recent Labs  Lab 05/06/22 1012 05/07/22 0701  AST 16 15  ALT 14 14  ALKPHOS 61 66  BILITOT 0.8 0.9  PROT 6.0* 6.2*  ALBUMIN 3.1* 3.1*   No results for input(s): "LIPASE", "AMYLASE" in the last 168 hours. Recent Labs  Lab 05/06/22 1012  AMMONIA 21   Coagulation Profile: No  results for input(s): "INR", "PROTIME" in the last 168 hours. Cardiac Enzymes: No results for input(s): "CKTOTAL", "CKMB", "CKMBINDEX", "TROPONINI" in the last 168 hours. BNP (last 3 results) No results for input(s): "PROBNP" in the last 8760 hours. HbA1C: No results for input(s): "HGBA1C" in the last 72 hours. CBG: Recent Labs  Lab 05/09/22 1148 05/09/22 1635 05/09/22 2149 05/10/22 0707 05/10/22 1146  GLUCAP 114* 99 116* 103* 118*   Lipid Profile: No results for input(s): "CHOL", "HDL", "LDLCALC", "TRIG", "CHOLHDL", "LDLDIRECT" in the last 72 hours. Thyroid Function Tests: No results for input(s): "TSH", "T4TOTAL", "FREET4", "T3FREE", "THYROIDAB" in the last 72 hours. Anemia Panel: No results for input(s): "VITAMINB12", "FOLATE", "FERRITIN", "TIBC", "IRON", "RETICCTPCT" in the last 72 hours. Urine analysis:    Component Value Date/Time   COLORURINE AMBER (A) 05/06/2022 1150   APPEARANCEUR CLEAR 05/06/2022 1150   LABSPEC 1.017 05/06/2022 1150   PHURINE 5.0 05/06/2022 1150   GLUCOSEU NEGATIVE 05/06/2022 [place] 07/14/2021  [place] 05/06/2022 1150   HGBUR large 10/23/2007 [place] 05/06/2022 1150   BILIRUBINUR N 03/02/2018 1655   KETONESUR NEGATIVE 05/06/2022 1150   PROTEINUR 100 (A) 05/06/2022 1150   UROBILINOGEN 0.2 07/14/2021 1046   NITRITE POSITIVE (A) 05/06/2022 1150   LEUKOCYTESUR TRACE (A) 05/06/2022 1150   Sepsis Labs: '@LABRCNTIP'$ (procalcitonin:4,lacticidven:4)  ) Recent Results (from the past 240 hour(s))  Resp Panel by RT-PCR (Flu A&B, Covid) Anterior Nasal Swab     Status: None   Collection Time: 05/06/22 10:12 AM   Specimen: Anterior Nasal Swab  Result Value Ref Range Status   SARS Coronavirus 2 by RT PCR NEGATIVE NEGATIVE Final    Comment: (NOTE) SARS-CoV-2 target nucleic acids are NOT DETECTED.  The SARS-CoV-2 RNA is generally detectable in upper respiratory specimens during the acute phase of  infection. The lowest concentration of SARS-CoV-2 viral copies this assay can detect is 138 copies/mL. A negative result does not preclude SARS-Cov-2 infection and should not be used as the sole basis for treatment or other patient management decisions. A negative result may occur with  improper specimen collection/handling, submission of specimen other than nasopharyngeal swab, presence of viral mutation(s) within the areas targeted by this assay, and inadequate number of viral copies(<138 copies/mL). A negative result must be combined with clinical observations, patient history, and epidemiological information. The expected result is Negative.  Fact Sheet for Patients:  EntrepreneurPulse.com.au  Fact Sheet for Healthcare Providers:  IncredibleEmployment.be  This test is no t yet approved or cleared by the [place] FDA and  has been authorized for detection and/or diagnosis of SARS-CoV-2 by FDA under an Emergency Use Authorization (EUA). This EUA will remain  in effect (meaning this test can be used) for the duration of the COVID-19 declaration under Section 564(b)(1) of the Act, 21 U.S.C.section 360bbb-3(b)(1), unless the authorization is terminated  or revoked sooner.       Influenza A by PCR NEGATIVE NEGATIVE Final   Influenza B by PCR NEGATIVE NEGATIVE Final    Comment: (NOTE) The Xpert Xpress SARS-CoV-2/FLU/RSV plus assay is intended as an aid in the diagnosis of influenza from Nasopharyngeal swab specimens and should not be used as a sole basis for treatment. Nasal washings and aspirates are unacceptable for Xpert Xpress SARS-CoV-2/FLU/RSV testing.  Fact Sheet for Patients: EntrepreneurPulse.com.au  Fact Sheet for Healthcare Providers: IncredibleEmployment.be  This test is not yet approved or cleared by the [place] FDA and has been authorized for detection and/or diagnosis of SARS-CoV-2  by FDA under an Emergency Use Authorization (EUA). This EUA will remain in effect (meaning this test can be used) for the duration of the COVID-19 declaration under Section 564(b)(1) of the Act, 21 U.S.C. section 360bbb-3(b)(1), unless the authorization is terminated or revoked.  Performed at [facility], [place] [address] [place], [place] [address]   Urine Culture     Status: Abnormal   Collection Time: 05/06/22 11:50 AM   Specimen: Urine, Catheterized  Result Value Ref Range Status   Specimen Description   Final    URINE, CATHETERIZED Performed at [facility] [address] [place], [place] [address]    Special Requests   Final    NONE Performed at [facility], [place] [address] [place], [place] [address]    Culture 30,000 COLONIES/mL STAPHYLOCOCCUS SIMULANS (A)  Final   Report Status 05/08/2022 FINAL  Final   Organism ID, Bacteria STAPHYLOCOCCUS SIMULANS (A)  Final  Susceptibility   Staphylococcus simulans - MIC*    CIPROFLOXACIN <=0.5 SENSITIVE Sensitive     GENTAMICIN <=0.5 SENSITIVE Sensitive     NITROFURANTOIN <=16 SENSITIVE Sensitive     OXACILLIN <=0.25 SENSITIVE Sensitive     TETRACYCLINE <=1 SENSITIVE Sensitive     VANCOMYCIN <=0.5 SENSITIVE Sensitive     TRIMETH/SULFA <=10 SENSITIVE Sensitive     CLINDAMYCIN <=0.25 SENSITIVE Sensitive     RIFAMPIN <=0.5 SENSITIVE Sensitive     Inducible Clindamycin NEGATIVE Sensitive     * 30,000 COLONIES/mL STAPHYLOCOCCUS SIMULANS  Culture, blood (routine x 2)     Status: None (Preliminary result)   Collection Time: 05/06/22  1:07 PM   Specimen: BLOOD  Result Value Ref Range Status   Specimen Description   Final    BLOOD LEFT FOREARM Performed at [place] [facility] [name] [place], [place] [address]    Special Requests   Final    BOTTLES DRAWN AEROBIC AND ANAEROBIC [place] Performed at [place] [address]  [place], [place] [address]    Culture   Final    NO GROWTH 4 DAYS Performed at [facility], [place] [address], [place], [place] [address]    Report Status PENDING  Incomplete  Culture, blood (routine x 2)     Status: None (Preliminary result)   Collection Time: 05/06/22  5:15 PM   Specimen: BLOOD  Result Value Ref Range Status   Specimen Description   Final    BLOOD LEFT ANTECUBITAL Performed at [facility] [address], [place], [place] [address]    Special Requests   Final    BOTTLES DRAWN AEROBIC ONLY Blood Culture adequate volume Performed at [place] [address], [place], [place] [address]    Culture   Final    NO GROWTH 4 DAYS Performed at [facility], [place] [address] [place], [place] [address]    Report Status PENDING  Incomplete      Studies: No results found.  Scheduled Meds:  apixaban  5 mg Oral BID   atorvastatin  20 mg Oral Daily   feeding supplement  237 mL Oral BID BM   FLUoxetine  40 mg Oral Daily   furosemide  40 mg Oral Daily   insulin aspart  0-15 Units Subcutaneous TID WC   insulin aspart  0-5 Units Subcutaneous QHS   melatonin  5 mg Oral QHS   methimazole  10 mg Oral TID   multivitamin with minerals  1 tablet Oral Daily   spironolactone  25 mg Oral Daily    Continuous Infusions:   LOS: 4 days     Kayleen Memos, MD [facility] Pager [phone]  If 7PM-7AM, please contact night-coverage [email or web address] Password [email or web address] 05/10/2022, 2:12 PM

## 2022-05-10 NOTE — Progress Notes (Signed)
Pt with 2.54 sec pause with a decrease in HR to the low 40s and sometimes down to 38. NP Olena Heckle notified. OK to hold propanolol, per NP. Drug held. Continued to monitor pt.

## 2022-05-11 ENCOUNTER — Inpatient Hospital Stay (HOSPITAL_COMMUNITY): Payer: Medicare Other

## 2022-05-11 DIAGNOSIS — I10 Essential (primary) hypertension: Secondary | ICD-10-CM | POA: Diagnosis not present

## 2022-05-11 DIAGNOSIS — I480 Paroxysmal atrial fibrillation: Secondary | ICD-10-CM | POA: Diagnosis not present

## 2022-05-11 DIAGNOSIS — N1831 Chronic kidney disease, stage 3a: Secondary | ICD-10-CM

## 2022-05-11 DIAGNOSIS — E0590 Thyrotoxicosis, unspecified without thyrotoxic crisis or storm: Secondary | ICD-10-CM | POA: Diagnosis not present

## 2022-05-11 DIAGNOSIS — I5043 Acute on chronic combined systolic (congestive) and diastolic (congestive) heart failure: Secondary | ICD-10-CM

## 2022-05-11 DIAGNOSIS — F03918 Unspecified dementia, unspecified severity, with other behavioral disturbance: Secondary | ICD-10-CM

## 2022-05-11 DIAGNOSIS — R778 Other specified abnormalities of plasma proteins: Secondary | ICD-10-CM

## 2022-05-11 DIAGNOSIS — G9341 Metabolic encephalopathy: Secondary | ICD-10-CM | POA: Diagnosis not present

## 2022-05-11 DIAGNOSIS — E7800 Pure hypercholesterolemia, unspecified: Secondary | ICD-10-CM

## 2022-05-11 LAB — GLUCOSE, CAPILLARY
Glucose-Capillary: 118 mg/dL — ABNORMAL HIGH (ref 70–99)
Glucose-Capillary: 120 mg/dL — ABNORMAL HIGH (ref 70–99)
Glucose-Capillary: 106 mg/dL — ABNORMAL HIGH (ref 70–99)
Glucose-Capillary: 99 mg/dL (ref 70–99)
Glucose-Capillary: 99 mg/dL (ref 70–99)

## 2022-05-11 LAB — SEDIMENTATION RATE: Sed Rate: 0 mm/hr (ref 0–16)

## 2022-05-11 LAB — CULTURE, BLOOD (ROUTINE X 2)
Culture: NO GROWTH
Culture: NO GROWTH
Special Requests: ADEQUATE

## 2022-05-11 LAB — BLOOD GAS, ARTERIAL
Acid-Base Excess: 0.5 mmol/L (ref 0.0–2.0)
Acid-base deficit: 0.7 mmol/L (ref 0.0–2.0)
Bicarbonate: 25.8 mmol/L (ref 20.0–28.0)
Bicarbonate: 28.9 mmol/L — ABNORMAL HIGH (ref 20.0–28.0)
O2 Saturation: 92 %
O2 Saturation: 100 %
Patient temperature: 37
Patient temperature: 36.3
pCO2 arterial: 49 mmHg — ABNORMAL HIGH (ref 32–48)
pCO2 arterial: 61 mmHg — ABNORMAL HIGH (ref 32–48)
pH, Arterial: 7.33 — ABNORMAL LOW (ref 7.35–7.45)
pH, Arterial: 7.28 — ABNORMAL LOW (ref 7.35–7.45)
pO2, Arterial: 66 mmHg — ABNORMAL LOW (ref 83–108)
pO2, Arterial: 115 mmHg — ABNORMAL HIGH (ref 83–108)

## 2022-05-11 LAB — CBC
HCT: 48.7 % (ref 39.0–52.0)
Hemoglobin: 15.3 g/dL (ref 13.0–17.0)
MCH: 28.7 pg (ref 26.0–34.0)
MCHC: 31.4 g/dL (ref 30.0–36.0)
MCV: 91.4 fL (ref 80.0–100.0)
Platelets: 298 10*3/uL (ref 150–400)
RBC: 5.33 MIL/uL (ref 4.22–5.81)
RDW: 15.7 % — ABNORMAL HIGH (ref 11.5–15.5)
WBC: 10 10*3/uL (ref 4.0–10.5)
nRBC: 0 % (ref 0.0–0.2)

## 2022-05-11 LAB — BASIC METABOLIC PANEL
Anion gap: 12 (ref 5–15)
BUN: 32 mg/dL — ABNORMAL HIGH (ref 8–23)
CO2: 22 mmol/L (ref 22–32)
Calcium: 8.7 mg/dL — ABNORMAL LOW (ref 8.9–10.3)
Chloride: 108 mmol/L (ref 98–111)
Creatinine, Ser: 1.49 mg/dL — ABNORMAL HIGH (ref 0.61–1.24)
GFR, Estimated: 49 mL/min — ABNORMAL LOW (ref >60)
Glucose, Bld: 120 mg/dL — ABNORMAL HIGH (ref 70–99)
Potassium: 4 mmol/L (ref 3.5–5.1)
Sodium: 142 mmol/L (ref 135–145)

## 2022-05-11 LAB — T4, FREE: Free T4: 3.16 ng/dL — ABNORMAL HIGH (ref 0.61–1.12)

## 2022-05-11 LAB — LACTIC ACID, PLASMA: Lactic Acid, Venous: 1.5 mmol/L (ref 0.5–1.9)

## 2022-05-11 LAB — MAGNESIUM: Magnesium: 2.1 mg/dL (ref 1.7–2.4)

## 2022-05-11 LAB — VITAMIN B12: Vitamin B-12: 1049 pg/mL — ABNORMAL HIGH (ref 180–914)

## 2022-05-11 LAB — MRSA NEXT GEN BY PCR, NASAL: MRSA by PCR Next Gen: DETECTED — AB

## 2022-05-11 MED ORDER — LORAZEPAM 0.5 MG PO TABS: 0.5000 mg | ORAL_TABLET | Freq: Four times a day (QID) | ORAL | Status: DC | PRN

## 2022-05-11 MED ORDER — ALBUMIN HUMAN 25 % IV SOLN
12.5000 g | Freq: Once | INTRAVENOUS | Status: AC
  Administered 2022-05-11: 12.5 g via INTRAVENOUS
  Filled 2022-05-11: qty 50

## 2022-05-11 MED ORDER — CHLORHEXIDINE GLUCONATE CLOTH 2 % EX PADS
6.0000 | MEDICATED_PAD | Freq: Every day | CUTANEOUS | Status: DC
Start: 2022-05-11 — End: 2022-05-23
  Administered 2022-05-11 – 2022-05-23 (×13): 6 via TOPICAL

## 2022-05-11 MED ORDER — MUPIROCIN 2 % EX OINT
1.0000 | TOPICAL_OINTMENT | Freq: Two times a day (BID) | CUTANEOUS | Status: AC
  Administered 2022-05-11 – 2022-05-16 (×10): 1 via NASAL
  Filled 2022-05-11 (×2): qty 22

## 2022-05-11 MED ORDER — CHOLESTYRAMINE LIGHT 4 G PO PACK
4.0000 g | PACK | Freq: Four times a day (QID) | ORAL | Status: DC
  Administered 2022-05-12 – 2022-05-23 (×37): 4 g via ORAL
  Filled 2022-05-11 (×51): qty 1

## 2022-05-11 MED ORDER — INSULIN ASPART 100 UNIT/ML IJ SOLN
0.0000 [IU] | INTRAMUSCULAR | Status: DC
  Administered 2022-05-12: 3 [IU] via SUBCUTANEOUS
  Administered 2022-05-14 – 2022-05-16 (×4): 2 [IU] via SUBCUTANEOUS

## 2022-05-11 MED ORDER — ORAL CARE MOUTH RINSE
15.0000 mL | OROMUCOSAL | Status: DC | PRN
Start: 2022-05-11 — End: 2022-05-23

## 2022-05-11 MED ORDER — FUROSEMIDE 10 MG/ML IJ SOLN
40.0000 mg | Freq: Once | INTRAMUSCULAR | Status: AC
  Administered 2022-05-11: 40 mg via INTRAVENOUS
  Filled 2022-05-11: qty 4

## 2022-05-11 NOTE — Consult Note (Signed)
NAME:  [name], MRN:  [id number], DOB:  01-16-1946, LOS: 5 ADMISSION DATE:  05/06/2022, CONSULTATION DATE:  05/11/22 REFERRING MD:  Dr. Tyrell Antonio, CHIEF COMPLAINT:  AMS   History of Present Illness:  76 y/o M who presented to [facility] on 7/21 with reports of altered mental status.    The patient is followed by Dr. [name] for primary care.  He was recently seen on 7/19 with concerns for worsening dementia and was referred for Neurology evaluation.  He was started on seroquel 50 mg QHS at that time with plans for return visit in 2 weeks. He was seen by his Cardiologist in January 2023 and noted to have lost a significant amount of weight > in July 2020 he was ~ 400lbs and in January 2023 he weighed 270lbs.    He presented 7/21 to the ER with reports of hallucinations & weakness. Daughter and wife reported a dramatic change on day of admission.  He was seeing things in the house and was awake all night.  He was confused and picking at things.  The family noted decreased appetite.  He had no acute complaints though. Initial ER work up notable for acutely negative CT of the head but showed moderate cerebral atrophy.  CXR showed borderline cardiomegaly, small bilateral pleural effusions L>R but no infiltrate. UDS negative. UA with concern for possible UTI and rocephin was initiated. He was admitted per [facility]. Initial labs notable for baseline renal function, elevated troponin/BNP, d-dimer and low TSH.  LE venous duplex assessed and negative.  TSH <0.010, Free T3 >5, Free T4 4.01.  The patient was started on methimazole and amiodarone was discontinued. Repeat ECHO showed reduced EF to 25-30% with global hypokinesis.  Cardiology was consulted.  MRI brain was ordered but was not completed. However, he has received 5.5 mg lorazepam since admission (does not take benzodiazepines at home). His mental status worsened and an ABG was obtained which showed ph 7.33 / CO2 49 / PO2 66 / Bicarb 25.8.  He was transferred to SDU  for observation.    PCCM consulted for evaluation.   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 rate (!) 24, height 6' 2"  (1.88 m), weight 120.3 kg, SpO2 95 %.        Intake/Output Summary (Last 24 hours) at 05/11/2022 1438 Last data filed at 05/11/2022 0023 Gross per 24 hour  Intake --  Output 525 ml  Net -525 ml   Filed Weights   05/07/22 0648 05/08/22 0500 05/10/22 0438  Weight: 121.6 kg 121.3 kg 120.3 kg    Examination: General: chronically ill appearing elderly adult male lying in bed in NAD HENT: eyes closed, looks toward provider when name called but does not open eyes,  pupils 64m  Lungs: non-labored at rest, mild paradoxical movement noted, clear with good air entry anterior, diminished bases Cardiovascular: S1S2 RRR, SB-SR on monitor Abdomen: obese, non-distended, bsx4 hypoactive  Extremities: warm/dry, LE changes consistent with chronic venous stasis but only trace to 1+ edema Neuro: altered, pulling at mittens and VM mask, moves all extremities / non-focal, attempts to sit up in bed while flat.  TSH <0.010, Free T3 5, T4 4.01  CXR 7/26 > stable cardiomegaly, bilateral hazy opacities with pleural effusions   Resolved Hospital Problem list      Assessment & Plan:   Thyrotoxicosis  Concern for Impending Thyroid Storm Hyperthyroidism Patient afebrile, remains SB-SR but was previously on beta blocker + amiodarone and had AKI on CKD. His Thyroid Storm score puts him at "possible impending" but this is based on his heart failure findings which are chronic and mental status.  His bilirubin is negative.  He has had significant weight loss since January 2020 based on note review from Cardiology.  His prior TSH have been WNL as recent as 06/2021.   He was on amiodarone and this has been stopped.  There is some concern that he may have some element of adult failure to thrive given weight loss.   -continue methimazole, cholestryramine  -follow TSH, T4, T3 intermittently  -place NGT to allow for medication delivery  -hold further beta blockers for now -assess lactic acid.  -will need endocrine follow up  -hold seroquel  Acute Metabolic Encephalopathy superimposed on Dementia  Patient has received 5.5 mg ativan since admission. He does not take benzodiazepines at baseline.  Baseline sr cr appears to be ~ 1.4.  His CT head was negative for acute process but does show moderate atrophy.   -hold further benzodiazepines  -treat possible underlying infectious process  -consider palliative care consultation for goals of care  -frequent reorientation, promote sleep wake cycle -continue home prozac  Acute Hypoxic / Hypercarbic Respiratory Failure  OSA Suspect in setting of shunt physiology due to atelectasis with benzos / altered mental status, baseline OSA. Unclear if he is on CPAP at home.  -O2 to support sats > 90% -pulmonary hygiene as able - upright positioning   Chronic Combined Systolic / Diastolic CHF  PAF  HTN, HLD  Currently in SR/SB.  -per Cardiology -hold amiodarone  -continue diuresis   Possible UTI  UA with positive nitrites, 11-20 WBC.  Urine culture with staph simulans.  Doubt this is active organism.  -s/p 3 days abx  (ceftriaxone > ampicillin) -monitor for now    Significant Weight Loss  Concern for Dementia Related Adult Failure to Thrive  Prior weight in 2020 ~ 400lbs, now down to 262lbs. Per chart review, he has lost weight even since he was last seen by Dr. CHarrell Gavein Jan 2023.  -place small bore gastric tube  -consider TF in am   Best Practice (right click and "Reselect all SmartList Selections" daily)  Per Primary / TR[facility]   Labs   CBC: Recent Labs  Lab 05/06/22 1012 05/07/22 0701 05/08/22 0829 05/09/22 0529 05/10/22 0843 05/11/22 0834  WBC 6.1 7.5 7.7 8.5 8.7 10.0  NEUTROABS 4.2  --  5.9 6.7 6.8  --   HGB 13.6 14.4 14.7 14.5 14.6 15.3  HCT 42.4 45.8 46.5 46.0 46.3 48.7  MCV 91.0 92.5 91.2 90.9 91.3 91.4  PLT 254 266 273 298 253 2333   Basic Metabolic Panel: Recent Labs  Lab 05/07/22 0701 05/08/22 0829 05/09/22 0529 05/10/22  5456 05/11/22 0834  NA 143 144 141 141 142  K 3.7 4.0 3.7 3.3* 4.0  CL 106 108 108 107 108  CO2 26 25 23 24 22   GLUCOSE 94 95 138* 106* 120*  BUN 28* 27* 27* 29* 32*  CREATININE 1.51* 1.45* 1.52* 1.40* 1.49*  CALCIUM 9.0 9.2 9.1 8.6* 8.7*  MG 1.9  --   --   --   --    GFR: Estimated Creatinine Clearance: 59 mL/min (A) (by C-G formula based on SCr of 1.49 mg/dL (H)). Recent Labs  Lab 05/06/22 1507 05/06/22 1715 05/07/22 0701 05/08/22 0829 05/09/22 0529 05/10/22 0843 05/11/22 0834  WBC  --   --    < > 7.7 8.5 8.7 10.0  LATICACIDVEN 2.4* 1.8  --   --   --   --   --    < > = values in this interval not displayed.    Liver Function Tests: Recent Labs  Lab 05/06/22 1012 05/07/22 0701  AST 16 15  ALT 14 14  ALKPHOS 61 66  BILITOT 0.8 0.9  PROT 6.0* 6.2*  ALBUMIN 3.1* 3.1*   No results for input(s): "LIPASE", "AMYLASE" in the last 168 hours. Recent Labs  Lab 05/06/22 1012  AMMONIA 21    ABG    Component Value Date/Time   PHART 7.33 (L) 05/11/2022 1250   PCO2ART 49 (H) 05/11/2022 1250   PO2ART 66 (L) 05/11/2022 1250   HCO3  25.8 05/11/2022 1250   TCO2 29 08/30/2013 1316   ACIDBASEDEF 0.7 05/11/2022 1250   O2SAT 92 05/11/2022 1250     Coagulation Profile: No results for input(s): "INR", "PROTIME" in the last 168 hours.  Cardiac Enzymes: No results for input(s): "CKTOTAL", "CKMB", "CKMBINDEX", "TROPONINI" in the last 168 hours.  HbA1C: Hgb A1c MFr Bld  Date/Time Value Ref Range Status  07/14/2021 10:46 AM 5.4 4.6 - 6.5 % Final    Comment:    Glycemic Control Guidelines for People with Diabetes:Non Diabetic:  <6%Goal of Therapy: <7%Additional Action Suggested:  >8%   11/04/2019 11:10 AM 5.6 4.6 - 6.5 % Final    Comment:    Glycemic Control Guidelines for People with Diabetes:Non Diabetic:  <6%Goal of Therapy: <7%Additional Action Suggested:  >8%     CBG: Recent Labs  Lab 05/10/22 1146 05/10/22 1615 05/10/22 2113 05/11/22 0812 05/11/22 1141  GLUCAP 118* 104* 123* 118* 120*    Review of Systems:   Unable to complete as patient is altered.  Information obtained from staff and chart review. No family at bedside.   Past Medical History:  He,  has a past medical history of Chronic systolic CHF (congestive heart failure) ([place]), Diabetes mellitus without complication ([place]), Diverticulitis, Headache(784.0), Hyperlipidemia, Hypertension, Lymphoma, non Hodgkin's, Morbid obesity ([place]), NICM (nonischemic cardiomyopathy) ([place]), and Sleep apnea.   Surgical History:   Past Surgical History:  Procedure Laterality Date   CARDIOVERSION N/A 06/28/2019   Procedure: CARDIOVERSION;  Surgeon: Lelon Perla, MD;  Location: [facility] ENDOSCOPY;  Service: Cardiovascular;  Laterality: N/A;   COLONOSCOPY  11/26/2021   HERNIA REPAIR     umblical   incarcerated hernia     vental 11/19/08 Dr. [name]   LEFT AND RIGHT HEART CATHETERIZATION WITH CORONARY ANGIOGRAM N/A 08/30/2013   Procedure: LEFT AND RIGHT HEART CATHETERIZATION WITH CORONARY ANGIOGRAM;  Surgeon: [name], MD;  Location: [facility] CATH LAB;  Service:  Cardiovascular;  Laterality: N/A;     Social History:   reports that he  quit smoking about 22 years ago. His smoking use included cigarettes. He started smoking about 56 years ago. He has a 104.00 pack-year smoking history. He has never used smokeless tobacco. He reports current alcohol use. He reports that he does not use drugs.   Family History:  His family history includes Cancer in his father and maternal grandfather; Diabetes in his father and paternal grandfather; Heart failure in his mother; Hypertension in his father and mother; Stroke in his maternal grandmother.   Allergies No Known Allergies   Home Medications  Prior to Admission medications   Medication Sig Start Date End Date Taking? Authorizing Provider  acetaminophen (TYLENOL) 325 MG tablet Take 650 mg by mouth 2 (two) times daily as needed (pain).   Yes [provider]  amiodarone (PACERONE) 200 MG tablet Take 1 tablet (200 mg total) by mouth daily. 11/05/21  Yes Buford Dresser, MD  apixaban (ELIQUIS) 5 MG TABS tablet TAKE 1 TABLET BY MOUTH TWICE A DAY Patient taking differently: Take 5 mg by mouth 2 (two) times daily. 08/23/21  Yes Buford Dresser, MD  atorvastatin (LIPITOR) 20 MG tablet TAKE 1 TABLET BY MOUTH EVERY DAY Patient taking differently: Take 20 mg by mouth daily. 04/04/22  Yes [name], MD  FLUoxetine (PROZAC) 40 MG capsule TAKE 1 CAPSULE BY MOUTH EVERY DAY Patient taking differently: Take 40 mg by mouth daily. 01/03/22  Yes [name], MD  furosemide (LASIX) 40 MG tablet TAKE 1 TABLET BY MOUTH EVERY DAY Patient taking differently: Take 40 mg by mouth daily. 04/04/22  Yes [name], MD  metFORMIN (GLUCOPHAGE) 500 MG tablet TAKE 1 TABLET BY MOUTH TWICE DAILY WITH A MEAL Patient taking differently: Take 500 mg by mouth 2 (two) times daily with a meal. 09/21/21  Yes [name], MD  metoprolol (TOPROL-XL) 200 MG 24 hr tablet TAKE 1 TABLET BY MOUTH EVERY DAY Patient taking  differently: Take 200 mg by mouth daily. 04/04/22  Yes [name], MD  Multiple Vitamins-Minerals (MULTIVITAMIN MEN 50+) TABS Take 1 tablet by mouth daily.   Yes [provider]  QUEtiapine (SEROQUEL) 50 MG tablet Take 1 tablet (50 mg total) by mouth at bedtime. 05/04/22  Yes [name], MD  spironolactone (ALDACTONE) 25 MG tablet TAKE 1 TABLET BY MOUTH EVERY DAY Patient taking differently: Take 25 mg by mouth daily. 12/28/21  Yes [name], MD  blood glucose meter kit and supplies Dispense based on patient and insurance preference. Use up to four times daily as directed. (FOR ICD-10 E10.9, E11.9). 03/15/22   [name], MD     Critical care time: 75 minutes     Noe Gens, MSN, APRN, NP-C, AGACNP-BC [facility] 05/11/2022, 2:38 PM   Please see [email or web address] for pager details.   From 7A-7P if no response, please call [phone] After hours, please call ELink [phone]

## 2022-05-11 NOTE — Progress Notes (Signed)
NGT placement attempted twice. Unable to place NGT due to pt coughing, being very agitated, and pulling at essential medical equipment. Pt remains NPO at this time.

## 2022-05-11 NOTE — Progress Notes (Signed)
Wife updated at bedside. She reports at baseline he has short term memory issues - has not balanced a check book in two years, recently restarted smoking, he has to have assistance with bathing, does not drive, normally does not eat well / typically one "good meal" per day.  She confirms he would want to be full code > he was asked on admission and he requested full support and feels he has the insight to understand questions normally.  Reviewed plan of care.  Support offered.     Noe Gens, MSN, APRN, NP-C, AGACNP-BC [facility] 05/11/2022, 7:15 PM   Please see [email or web address] for pager details.   From 7A-7P if no response, please call [phone] After hours, please call ELink [phone]

## 2022-05-11 NOTE — Progress Notes (Signed)
MRI came to get patient after patient had rec'd Ativan 0.5 mg IV after receiving Ativan 1 mg PO approx an hour prior, patient agitated, and was climbing off MRI table patient was brought back to room, Olena Heckle NP aware.

## 2022-05-11 NOTE — Progress Notes (Signed)
PT Cancellation Note  Patient Details Name: [name] MRN: [id number] DOB: 02-Jun-1946   Cancelled Treatment:    Reason Eval/Treat Not Completed: Patient's level of consciousness;Fatigue/lethargy limiting ability to participate (pt is sleeping soundly, did not arouse to verbal/tactile stimulii. Noted pt had ativan earlier this morning. Will follow.)   Philomena Doheny PT 05/11/2022  Acute Rehabilitation Services  Office [phone]

## 2022-05-11 NOTE — Progress Notes (Addendum)
PROGRESS NOTE    [name]  HTD:[id number] DOB: [birth year] DOA: 05/06/2022 PCP: [name], MD   Brief Narrative: 76 year old with past medical history significant for PAF on Eliquis, hyperlipidemia, diabetes type 2, chronic heart failure reduced ejection fraction 45%, dementia.  Patient presented with altered mental status, increased confusion over the last several days.  He has been having hallucination.  Patient was a started on Seroquel a couple of days prior to admission.  He has been following with his PCP for worsening dementia.  Evaluation in the ED GL hypothyroidism, with a TSH of 0.01, free T4 at 4.0, free T3 at 5.  He was started on methimazole and amiodarone was discontinued.  Patient had some SVT , 2D echo showed lower ejection fraction to 25 to 30%.  Global hypokinesis.  Cardiology has been consulted  He has been confused, and agitated.  MRI was ordered 7/25th.  Patient received IV Ativan, he is more lethargic today.   Assessment & Plan:   Principal Problem:   Hyperthyroidism Active Problems:   Essential hypertension   HLD (hyperlipidemia)   DM2 (diabetes mellitus, type 2) (HCC)   Chronic HFrEF (heart failure with reduced ejection fraction) (HCC)   PAF (paroxysmal atrial fibrillation) (HCC)   Acute metabolic encephalopathy   Dementia with behavioral disturbance (HCC)   Stage 3a chronic kidney disease (CKD) (HCC)   Elevated troponin   Elevated d-dimer  1-Hyperthyroidism: -Patient presented with a TSH of 0.01, free T3 at 5, free T4 at 4. -Patient was a started on methimazole and propanolol -Hold on hold due to bradycardia -Will repeat free T3 and free T4. -Start patient on cholestyramine 4 g 4 times a day  2-Acute metabolic encephalopathy -In the setting of hyperthyroidism -Hopefully with treating hyperthyroidism confusion will improve. -MRI order, he got agitated last night and unable to complete MRI.  He received Ativan -He is more lethargic this  morning after he received Ativan last night. -Plan to proceed with ABG  3-PAF: -Metoprolol held due to Bradycardia.  -Amiodarone discontinue due to hyperthyroidism.  -Cardiology consulted, follow input.   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  5-Acute Hypoxic Respiratory Failure;  Patient more lethargic today.  Received ativan last night.  ABG showed PO2: 66, PCO2 49. Stat Chest x ray.  CCM consulted.  Transfer to ICU.   Diabetes type 2: Continue with a sliding scale insulin  Dementia with behavioral disturbance.  Seroquel has been on hold, he started to have hallucination after he was started on Seroquel  Hyperlipidemia: Continue with Lipitor  Hypertension: On spironolactone and Lasix Elevated D-dimer: Doppler negative for DVT Continue with Eliquis, he was already on Eliquis for PAF  Hypokalemia:Replaced.   Generalized weakness: PT , OT   30,000 staph in urine. Blood culture negative   Nutrition Problem: Increased nutrient needs Etiology: acute illness    Signs/Symptoms: estimated needs    Interventions: Ensure Enlive (each supplement provides 350kcal and 20 grams of protein), Liberalize Diet  Estimated body mass index is 34.05 kg/m as calculated from the following:   Height as of this encounter: '6\' 2"'$  (1.88 m).   Weight as of this encounter: 120.3 kg.   DVT prophylaxis: Eliquis Code Status: Full code Family Communication: Wife over phone Disposition Plan:  Status is: Inpatient Remains inpatient appropriate because: management of hyperthyroidism     Consultants:  Cardiology  Procedures:  ECHO  Antimicrobials:    Subjective: He is lethargic, received oral ativan and oral ativan last night for MRI.    Objective: Vitals:   05/10/22 0535 05/10/22 1339 05/10/22 2058 05/11/22 0500  BP: 130/74 119/84  116/64 131/86  Pulse: 66 69 67 76  Resp: '20 20 20 '$ (!) 22  Temp: 97.6 F (36.4 C) (!) 97.4 F (36.3 C) 97.6 F (36.4 C) (!) 97 F (36.1 C)  TempSrc: Oral Oral Axillary Axillary  SpO2: 97% 96% 96% 95%  Weight:      Height:        Intake/Output Summary (Last 24 hours) at 05/11/2022 0912 Last data filed at 05/11/2022 0023 Gross per 24 hour  Intake --  Output 525 ml  Net -525 ml   Filed Weights   05/07/22 0648 05/08/22 0500 05/10/22 0438  Weight: 121.6 kg 121.3 kg 120.3 kg    Examination:  General exam: Appears calm and comfortable  Respiratory system: Clear to auscultation. Respiratory effort normal. Cardiovascular system: S1 & S2 heard, RRR. No JVD, murmurs, rubs, gallops or clicks. No pedal edema. Gastrointestinal system: Abdomen is nondistended, soft and nontender. No organomegaly or masses felt. Normal bowel sounds heard. Central nervous system: lethargic.  Extremities: Symmetric 5 x 5 power.   Data Reviewed: I have personally reviewed following labs and imaging studies  CBC: Recent Labs  Lab 05/06/22 1012 05/07/22 0701 05/08/22 0829 05/09/22 0529 05/10/22 0843 05/11/22 0834  WBC 6.1 7.5 7.7 8.5 8.7 10.0  NEUTROABS 4.2  --  5.9 6.7 6.8  --   HGB 13.6 14.4 14.7 14.5 14.6 15.3  HCT 42.4 45.8 46.5 46.0 46.3 48.7  MCV 91.0 92.5 91.2 90.9 91.3 91.4  PLT 254 266 273 298 253 026   Basic Metabolic Panel: Recent Labs  Lab 05/06/22 1012 05/07/22 0701 05/08/22 0829 05/09/22 0529 05/10/22 0843  NA 145 143 144 141 141  K 3.6 3.7 4.0 3.7 3.3*  CL 110 106 108 108 107  CO2 '25 26 25 23 24  '$ GLUCOSE 102* 94 95 138* 106*  BUN 26* 28* 27* 27* 29*  CREATININE 1.48* 1.51* 1.45* 1.52* 1.40*  CALCIUM 8.9 9.0 9.2 9.1 8.6*  MG  --  1.9  --   --   --    GFR: Estimated Creatinine Clearance: 62.8 mL/min (A) (by C-G formula based on SCr of 1.4 mg/dL (H)). Liver Function Tests: Recent Labs  Lab 05/06/22 1012 05/07/22 0701  AST 16 15  ALT 14 14  ALKPHOS 61 66  BILITOT  0.8 0.9  PROT 6.0* 6.2*  ALBUMIN 3.1* 3.1*   No results for input(s): "LIPASE", "AMYLASE" in the last 168 hours. Recent Labs  Lab 05/06/22 1012  AMMONIA 21   Coagulation Profile: No results for input(s): "INR", "PROTIME" in the last 168 hours. Cardiac Enzymes: No results for input(s): "CKTOTAL", "CKMB", "CKMBINDEX", "TROPONINI" in the last 168 hours. BNP (last 3 results) No results for input(s): "PROBNP" in the last 8760 hours. HbA1C: No results for input(s): "HGBA1C" in the last 72 hours. CBG: Recent Labs  Lab 05/10/22 0707 05/10/22 1146 05/10/22 1615 05/10/22 2113 05/11/22 0812  GLUCAP 103* 118* 104* 123* 118*   Lipid Profile: No results for input(s): "CHOL", "HDL", "LDLCALC", "TRIG", "CHOLHDL", "LDLDIRECT" in the last 72 hours. Thyroid Function Tests: No results for input(s): "TSH", "T4TOTAL", "FREET4", "T3FREE", "THYROIDAB" in the last 72 hours. Anemia Panel: No results for input(s): "VITAMINB12", "FOLATE", "FERRITIN", "TIBC", "IRON", "RETICCTPCT" in the last 72 hours. Sepsis Labs: Recent Labs  Lab 05/06/22 1507 05/06/22 1715  LATICACIDVEN 2.4* 1.8    Recent Results (from the past 240 hour(s))  Resp Panel by RT-PCR (Flu A&B, Covid) Anterior Nasal Swab     Status: None   Collection Time: 05/06/22 10:12 AM   Specimen: Anterior Nasal Swab  Result Value Ref Range Status   SARS Coronavirus 2 by RT PCR NEGATIVE NEGATIVE Final    Comment: (NOTE) SARS-CoV-2 target nucleic acids are NOT DETECTED.  The SARS-CoV-2 RNA is generally detectable in upper respiratory specimens during the acute phase of infection. The lowest concentration of SARS-CoV-2 viral copies this assay can detect is 138 copies/mL. A negative result does not preclude SARS-Cov-2 infection and should not be used as the sole basis for treatment or other patient management decisions. A negative result may occur with  improper specimen collection/handling, submission of specimen other than nasopharyngeal  swab, presence of viral mutation(s) within the areas targeted by this assay, and inadequate number of viral copies(<138 copies/mL). A negative result must be combined with clinical observations, patient history, and epidemiological information. The expected result is Negative.  Fact Sheet for Patients:  EntrepreneurPulse.com.au  Fact Sheet for Healthcare Providers:  IncredibleEmployment.be  This test is no t yet approved or cleared by the [place] FDA and  has been authorized for detection and/or diagnosis of SARS-CoV-2 by FDA under an Emergency Use Authorization (EUA). This EUA will remain  in effect (meaning this test can be used) for the duration of the COVID-19 declaration under Section 564(b)(1) of the Act, 21 U.S.C.section 360bbb-3(b)(1), unless the authorization is terminated  or revoked sooner.       Influenza A by PCR NEGATIVE NEGATIVE Final   Influenza B by PCR NEGATIVE NEGATIVE Final    Comment: (NOTE) The Xpert Xpress SARS-CoV-2/FLU/RSV plus assay is intended as an aid in the diagnosis of influenza from Nasopharyngeal swab specimens and should not be used as a sole basis for treatment. Nasal washings and aspirates are unacceptable for Xpert Xpress SARS-CoV-2/FLU/RSV testing.  Fact Sheet for Patients: EntrepreneurPulse.com.au  Fact Sheet for Healthcare Providers: IncredibleEmployment.be  This test is not yet approved or cleared by the [place] FDA and has been authorized for detection and/or diagnosis of SARS-CoV-2 by FDA under an Emergency Use Authorization (EUA). This EUA will remain in effect (meaning this test can be used) for the duration of the COVID-19 declaration under Section 564(b)(1) of the Act, 21 U.S.C. section 360bbb-3(b)(1), unless the authorization is terminated or revoked.  Performed at [facility], [place] [address], [place], [place] [address]    Urine Culture     Status: Abnormal   Collection Time: 05/06/22 11:50 AM   Specimen: Urine, Catheterized  Result Value Ref Range Status   Specimen Description   Final    URINE, CATHETERIZED Performed at [place] [address], [place], [place] [address]    Special Requests   Final    NONE Performed at [facility], [place] [address], [place], [place] [address]    Culture 30,000 COLONIES/mL STAPHYLOCOCCUS SIMULANS (A)  Final   Report Status 05/08/2022 FINAL  Final   Organism ID, Bacteria STAPHYLOCOCCUS SIMULANS (A)  Final      Susceptibility   Staphylococcus simulans - MIC*    CIPROFLOXACIN <=0.5 SENSITIVE Sensitive     GENTAMICIN <=0.5 SENSITIVE Sensitive     NITROFURANTOIN <=16 SENSITIVE Sensitive     OXACILLIN <=0.25 SENSITIVE Sensitive     TETRACYCLINE <=1 SENSITIVE Sensitive  VANCOMYCIN <=0.5 SENSITIVE Sensitive     TRIMETH/SULFA <=10 SENSITIVE Sensitive     CLINDAMYCIN <=0.25 SENSITIVE Sensitive     RIFAMPIN <=0.5 SENSITIVE Sensitive     Inducible Clindamycin NEGATIVE Sensitive     * 30,000 COLONIES/mL STAPHYLOCOCCUS SIMULANS  Culture, blood (routine x 2)     Status: None   Collection Time: 05/06/22  1:07 PM   Specimen: BLOOD  Result Value Ref Range Status   Specimen Description   Final    BLOOD LEFT FOREARM Performed at [facility] [address], [place], [place] [address]    Special Requests   Final    BOTTLES DRAWN AEROBIC AND ANAEROBIC [place] Performed at [facility] [address] [place], [place] [address]    Culture   Final    NO GROWTH 5 DAYS Performed at [facility], [place] [address], [place], [place] [address]    Report Status 05/11/2022 FINAL  Final  Culture, blood (routine x 2)     Status: None   Collection Time: 05/06/22  5:15 PM   Specimen: BLOOD  Result Value Ref Range Status   Specimen Description   Final    BLOOD LEFT ANTECUBITAL Performed at [facility] [address] [place], [place] [address]    Special Requests   Final    BOTTLES DRAWN AEROBIC ONLY Blood Culture adequate volume Performed at [facility] [address], [place], [place] [address]    Culture   Final    NO GROWTH 5 DAYS Performed at [facility], [place] [address], [place], [place] [address]    Report Status 05/11/2022 FINAL  Final         Radiology Studies: No results found.      Scheduled Meds:  apixaban  5 mg Oral BID   atorvastatin  20 mg Oral Daily   feeding supplement  237 mL Oral BID BM   FLUoxetine  40 mg Oral Daily   furosemide  40 mg Oral Daily   insulin aspart  0-15 Units Subcutaneous TID WC   insulin aspart  0-5 Units Subcutaneous QHS   melatonin  5 mg Oral QHS   methimazole  10 mg Oral TID   multivitamin with minerals  1 tablet Oral Daily   spironolactone  25 mg Oral Daily   Continuous Infusions:   LOS: 5 days    Time spent: 35 minutes    Kymberlie Brazeau A Elyan Vanwieren, MD [facility]   If 7PM-7AM, please contact night-coverage [email or web address]  05/11/2022, 9:12 AM

## 2022-05-11 NOTE — Progress Notes (Addendum)
Cardiology Consultation:   Patient ID: [name] MRN: [id number]; DOB: [birth year]  Admit date: 05/06/2022 Date of Consult: 05/11/2022  Primary Care Provider: [name], MD [facility] Cardiologist: Buford Dresser, MD  [place] Electrophysiologist:  None    Patient Profile:   [name] is a 76 y.o. male with a hx of chronic systolic CHF with EF 35 to 40% and diffuse hypokinesis in 2014 with normal coronary arteries by cath at that time, diabetes, hyperlipidemia, hypertension, non-Hodgkin's lymphoma and obstructive sleep apnea who is being seen today for the evaluation of worsening LV function at the request of Deland Pretty MD.  History of Present Illness:   [name] is a 76 y.o. male with a hx of chronic systolic CHF with EF 35 to 40% and diffuse hypokinesis in 2014 with normal coronary arteries by cath at that time, diabetes, dementia, hyperlipidemia, hypertension, non-Hodgkin's lymphoma and obstructive sleep apnea.  He presented to the emergency room with complaints of altered mental status.  All history in this H&P is obtained through the chart as the patient is obtunded during physical exam today and there is no family members present to get a history.  Apparently the daughter stated on admission that the patient had become increasingly confused over the past several days and was hallucinating about objects that were not present.  He does have a history of dementia and they were concerned that he was having progression of his dementia.  He had been started on Seroquel a few days prior.  Since then seems to have worsened from a mental status point of view.  After no improvement in symptoms he was brought into the emergency room.    He was found to have a UTI and is on antibiotics.  He had no clinical signs of heart failure but BNP was elevated at 2258 and high-sensitivity troponin mildly elevated but flat at 83 and 86.  According to his daughter he  had not complained of any chest pain.  he was continued on his home diuretic.  Due to elevated troponin a 2D echocardiogram was obtained.  This showed worsening LV dysfunction with EF 25 to 30% with severe global hypokinesis, mild LVH and grade 3 diastolic dysfunction with mild RV systolic dysfunction, severe left atrial enlargement and mild to moderate MR.  Cardiology is now asked to consult.  Unfortunately the patient is very obtunded today and respiratory therapy is at the bedside trying to get an ABG.  I cannot get him to open his eyes or respond other than a moan even with sternal rub so no history could be obtained from the patient.   Past Medical History:  Diagnosis Date   Chronic systolic CHF (congestive heart failure) ([place])    a. Echo (08/28/13): Mild LVH, EF 35-40%, diffuse HK, moderate to severe LAE.   Diabetes mellitus without complication (HCC)    Diverticulitis    Headache(784.0)    Hyperlipidemia    Hypertension    Lymphoma, non Hodgkin's    sees Dr. [name]    Morbid obesity [facility])    NICM (nonischemic cardiomyopathy) ([place])    a. R/L Heart cath 08/30/13: RA mean 8, RV 30/0, PA 27/12, mean PCWP 9, CO 5.67, CI 1.98; normal coronary arteries   Sleep apnea     Past Surgical History:  Procedure Laterality Date   CARDIOVERSION N/A 06/28/2019   Procedure: CARDIOVERSION;  Surgeon: Lelon Perla, MD;  Location: [place];  Service: Cardiovascular;  Laterality: N/A;  COLONOSCOPY  11/26/2021   HERNIA REPAIR     umblical   incarcerated hernia     vental 11/19/08 Dr. [name]   LEFT AND RIGHT HEART CATHETERIZATION WITH CORONARY ANGIOGRAM N/A 08/30/2013   Procedure: LEFT AND RIGHT HEART CATHETERIZATION WITH CORONARY ANGIOGRAM;  Surgeon: [name], MD;  [facility] C[facility] LAB;  Service: Cardiovascular;  Laterality: N/A;     Home Medications:  Prior to Admission medications   Medication Sig Start Date End Date Taking? Authorizing Provider  acetaminophen (TYLENOL) 325  MG tablet Take 650 mg by mouth 2 (two) times daily as needed (pain).   Yes [provider]  amiodarone (PACERONE) 200 MG tablet Take 1 tablet (200 mg total) by mouth daily. 11/05/21  Yes Buford Dresser, MD  apixaban (ELIQUIS) 5 MG TABS tablet TAKE 1 TABLET BY MOUTH TWICE A DAY Patient taking differently: Take 5 mg by mouth 2 (two) times daily. 08/23/21  Yes Buford Dresser, MD  atorvastatin (LIPITOR) 20 MG tablet TAKE 1 TABLET BY MOUTH EVERY DAY Patient taking differently: Take 20 mg by mouth daily. 04/04/22  Yes [name], MD  FLUoxetine (PROZAC) 40 MG capsule TAKE 1 CAPSULE BY MOUTH EVERY DAY Patient taking differently: Take 40 mg by mouth daily. 01/03/22  Yes [name], MD  furosemide (LASIX) 40 MG tablet TAKE 1 TABLET BY MOUTH EVERY DAY Patient taking differently: Take 40 mg by mouth daily. 04/04/22  Yes [name], MD  metFORMIN (GLUCOPHAGE) 500 MG tablet TAKE 1 TABLET BY MOUTH TWICE DAILY WITH A MEAL Patient taking differently: Take 500 mg by mouth 2 (two) times daily with a meal. 09/21/21  Yes [name], MD  metoprolol (TOPROL-XL) 200 MG 24 hr tablet TAKE 1 TABLET BY MOUTH EVERY DAY Patient taking differently: Take 200 mg by mouth daily. 04/04/22  Yes [name], MD  Multiple Vitamins-Minerals (MULTIVITAMIN MEN 50+) TABS Take 1 tablet by mouth daily.   Yes [provider]  QUEtiapine (SEROQUEL) 50 MG tablet Take 1 tablet (50 mg total) by mouth at bedtime. 05/04/22  Yes [name], MD  spironolactone (ALDACTONE) 25 MG tablet TAKE 1 TABLET BY MOUTH EVERY DAY Patient taking differently: Take 25 mg by mouth daily. 12/28/21  Yes [name], MD  blood glucose meter kit and supplies Dispense based on patient and insurance preference. Use up to four times daily as directed. (FOR ICD-10 E10.9, E11.9). 03/15/22   [name], MD    Inpatient Medications: Scheduled Meds:  apixaban  5 mg Oral BID   atorvastatin  20 mg Oral Daily    cholestyramine light  4 g Oral QID   feeding supplement  237 mL Oral BID BM   FLUoxetine  40 mg Oral Daily   furosemide  40 mg Oral Daily   insulin aspart  0-15 Units Subcutaneous TID WC   insulin aspart  0-5 Units Subcutaneous QHS   melatonin  5 mg Oral QHS   methimazole  10 mg Oral TID   multivitamin with minerals  1 tablet Oral Daily   spironolactone  25 mg Oral Daily   Continuous Infusions:  PRN Meds: acetaminophen **OR** acetaminophen, LORazepam, mouth rinse  Allergies:   No Known Allergies  Social History:   Social History   Socioeconomic History   Marital status: Married    Spouse name: Not on file   Number of children: Not on file   Years of education: Not on file   Highest  education level: Not on file  Occupational History   Occupation: retired--postal Secretary/administrator: RETIRED  Tobacco Use   Smoking status: Former    Packs/day: 2.00    Years: 52.00    Total pack years: 104.00    Types: Cigarettes    Start date: 08/27/1965    Quit date: 08/28/1999    Years since quitting: 22.7   Smokeless tobacco: Never   Tobacco comments:    quit 13 years ago  Vaping Use   Vaping Use: Never used  Substance and Sexual Activity   Alcohol use: Yes    Alcohol/week: 0.0 standard drinks of alcohol    Comment: occ   Drug use: No   Sexual activity: Not on file  Other Topics Concern   Not on file  Social History Narrative   Lives with wife in two story home      Right handed      Highest level of edu- some college      Retired   Investment banker, operational of Radio broadcast assistant Strain: [place]  (04/28/2021)   Overall Financial Resource Strain (CARDIA)    Difficulty of Paying Living Expenses: Not hard at all  Food Insecurity: No [place] (04/28/2021)   Hunger Vital Sign    Worried About Running Out of Food in the Last Year: Never true    [place] in the Last Year: Never true  Transportation Needs: No Transportation Needs (04/28/2021)   PRAPARE -  Hydrologist (Medical): No    Lack of Transportation (Non-Medical): No  Physical Activity: Inactive (04/28/2021)   Exercise Vital Sign    Days of Exercise per Week: 0 days    Minutes of Exercise per Session: 0 min  Stress: No Stress Concern Present (04/28/2021)   [place]    Feeling of Stress : Not at all  Social Connections: Moderately Isolated (04/28/2021)   Social Connection and Isolation Panel [NHANES]    Frequency of Communication with Friends and Family: Three times a week    Frequency of Social Gatherings with Friends and Family: Three times a week    Attends Religious Services: Never    Active Member of Clubs or Organizations: No    Attends Archivist Meetings: Never    Marital Status: Married  Human resources officer Violence: Not At Risk (04/28/2021)   Humiliation, Afraid, Rape, and Kick questionnaire    Fear of Current or Ex-Partner: No    Emotionally Abused: No    Physically Abused: No    Sexually Abused: No    Family History:    Family History  Problem Relation Age of Onset   Heart failure Mother    Hypertension Mother    Cancer Father        colon   Hypertension Father    Diabetes Father    Stroke Maternal Grandmother    Diabetes Paternal Grandfather    Cancer Maternal Grandfather      ROS:  Please see the history of present illness.   All other ROS reviewed and negative.     Physical Exam/Data:   Vitals:   05/10/22 1339 05/10/22 2058 05/11/22 0500 05/11/22 1356  BP: 119/84 116/64 131/86 (!) 147/81  Pulse: 69 67 76 (!) 49  Resp: 20 20 (!) 22   Temp: (!) 97.4 F (36.3 C) 97.6 F (36.4 C) (!) 97 F (36.1 C)   TempSrc:  Oral Axillary Axillary   SpO2: 96% 96% 95% 95%  Weight:      Height:        Intake/Output Summary (Last 24 hours) at 05/11/2022 1406 Last data filed at 05/11/2022 0023 Gross per 24 hour  Intake --  Output 525 ml  Net -525 ml       05/10/2022    4:38 AM 05/08/2022    5:00 AM 05/07/2022    6:48 AM  Last 3 Weights  Weight (lbs) 265 lb 3.4 oz 267 lb 6.7 oz 268 lb 1.3 oz  Weight (kg) 120.3 kg 121.3 kg 121.6 kg     Body mass index is 34.05 kg/m.  General: Ill-appearing disheveled HEENT: normal Lymph: no adenopathy Neck: no JVD Endocrine:  No thryomegaly Vascular: No carotid bruits; FA pulses 2+ bilaterally without bruits  Cardiac:  normal S1, S2; RRR; 2/6 systolic murmur at the left lower sternal border to the apex Lungs:  clear to auscultation bilaterally, no wheezing, rhonchi or rales  Abd: soft, nontender, no hepatomegaly  Ext: no edema Musculoskeletal:  No deformities, BUE and BLE strength normal and equal Skin:  small ecchymotic areas noted on his toes with cool feet Neuro:  cannot assess>>currently obtunded Psych: cannot assess>>patient otunded  EKG:  The EKG was personally reviewed and demonstrates:  Normal sinus rhythm with PACs and PVCs and anterior infarct with no acute ST changes Telemetry:  Telemetry was personally reviewed and demonstrates:   normal sinus rhythm with PACs and PVCs  Relevant CV Studies: 2D echo 05/07/2022 IMPRESSIONS    1. Left ventricular ejection fraction, by estimation, is 25 to 30%. The  left ventricle has severely decreased function. The left ventricle  demonstrates global hypokinesis. There is mild left ventricular  hypertrophy. Left ventricular diastolic parameters   are consistent with Grade III diastolic dysfunction (restrictive).   2. Right ventricular systolic function is mildly reduced. The right  ventricular size is mildly enlarged.   3. Left atrial size was severely dilated.   4. Right atrial size was mild to moderately dilated.   5. The mitral valve is normal in structure. Mild to moderate mitral valve  regurgitation. No evidence of mitral stenosis.   6. The aortic valve is tricuspid. There is mild calcification of the  aortic valve. There is mild thickening of the  aortic valve. Aortic valve  regurgitation is mild. No aortic stenosis is present.   7. Aortic dilatation noted. There is borderline dilatation of the aortic  root, measuring 41 mm.   Chemistry Recent Labs  Lab 05/09/22 0529 05/10/22 0843 05/11/22 0834  NA 141 141 142  K 3.7 3.3* 4.0  CL 108 107 108  CO2 23 24 22   GLUCOSE 138* 106* 120*  BUN 27* 29* 32*  CREATININE 1.52* 1.40* 1.49*  CALCIUM 9.1 8.6* 8.7*  GFRNONAA 47* 52* 49*  ANIONGAP 10 10 12     Recent Labs  Lab 05/06/22 1012 05/07/22 0701  PROT 6.0* 6.2*  ALBUMIN 3.1* 3.1*  AST 16 15  ALT 14 14  ALKPHOS 61 66  BILITOT 0.8 0.9   Hematology Recent Labs  Lab 05/09/22 0529 05/10/22 0843 05/11/22 0834  WBC 8.5 8.7 10.0  RBC 5.06 5.07 5.33  HGB 14.5 14.6 15.3  HCT 46.0 46.3 48.7  MCV 90.9 91.3 91.4  MCH 28.7 28.8 28.7  MCHC 31.5 31.5 31.4  RDW 15.5 15.7* 15.7*  PLT 298 253 298   BNP Recent Labs  Lab 05/06/22 1140  BNP 2,258.1*  DDimer  Recent Labs  Lab 05/06/22 1140  DDIMER 1.17*     Radiology/Studies:  ECHOCARDIOGRAM COMPLETE  Result Date: 05/07/2022    ECHOCARDIOGRAM REPORT   Patient Name:   BRADELY RUDIN Date of Exam: 05/07/2022 Medical Rec #:  [id number]         Height:       74.0 in Accession #:    [id number]        Weight:       268.1 lb Date of Birth:  04-May-1946         BSA:          2.462 m Patient Age:    10 years          BP:           119/66 mmHg Patient Gender: M                 HR:           82 bpm. Exam Location:  Inpatient Procedure: 2D Echo, Cardiac Doppler, Color Doppler and Intracardiac            Opacification Agent                               MODIFIED REPORT: This report was modified by Cherlynn Kaiser MD on 05/07/2022 due to revisions.  Indications:     Elevated Troponin  History:         Patient has prior history of Echocardiogram examinations, most                  recent 04/08/2019. Arrythmias:Atrial Fibrillation; Risk                  Factors:Hypertension, Dyslipidemia and  Diabetes.  Sonographer:     Luane School RDCS Referring Phys:  [id number] Jonnie Finner Diagnosing Phys: Cherlynn Kaiser MD  Sonographer Comments: Suboptimal apical window. IMPRESSIONS  1. Left ventricular ejection fraction, by estimation, is 25 to 30%. The left ventricle has severely decreased function. The left ventricle demonstrates global hypokinesis. There is mild left ventricular hypertrophy. Left ventricular diastolic parameters  are consistent with Grade III diastolic dysfunction (restrictive).  2. Right ventricular systolic function is mildly reduced. The right ventricular size is mildly enlarged.  3. Left atrial size was severely dilated.  4. Right atrial size was mild to moderately dilated.  5. The mitral valve is normal in structure. Mild to moderate mitral valve regurgitation. No evidence of mitral stenosis.  6. The aortic valve is tricuspid. There is mild calcification of the aortic valve. There is mild thickening of the aortic valve. Aortic valve regurgitation is mild. No aortic stenosis is present.  7. Aortic dilatation noted. There is borderline dilatation of the aortic root, measuring 41 mm. FINDINGS  Left Ventricle: Left ventricular ejection fraction, by estimation, is 25 to 30%. The left ventricle has severely decreased function. The left ventricle demonstrates global hypokinesis. Definity contrast agent was given IV to delineate the left ventricular endocardial borders. The left ventricular internal cavity size was normal in size. There is mild left ventricular hypertrophy. Left ventricular diastolic parameters are consistent with Grade III diastolic dysfunction (restrictive). Right Ventricle: The right ventricular size is mildly enlarged. No increase in right ventricular wall thickness. Right ventricular systolic function is mildly reduced. Left Atrium: Left atrial size was severely dilated. Right Atrium: Right atrial size was mild to moderately dilated. Pericardium: Trivial pericardial effusion  is  present. Mitral Valve: Color scale was reduced for apical images of MR, which may overestimate severity. The mitral valve is normal in structure. Mild to moderate mitral valve regurgitation. No evidence of mitral valve stenosis. Tricuspid Valve: The tricuspid valve is normal in structure. Tricuspid valve regurgitation is mild . No evidence of tricuspid stenosis. Aortic Valve: The aortic valve is tricuspid. There is mild calcification of the aortic valve. There is mild thickening of the aortic valve. Aortic valve regurgitation is mild. Aortic regurgitation PHT measures 454 msec. No aortic stenosis is present. Pulmonic Valve: The pulmonic valve was normal in structure. Pulmonic valve regurgitation is mild. No evidence of pulmonic stenosis. Aorta: Aortic dilatation noted. There is borderline dilatation of the aortic root, measuring 41 mm. Venous: The inferior vena cava was not well visualized. IAS/Shunts: No atrial level shunt detected by color flow Doppler.  LEFT VENTRICLE PLAX 2D LVIDd:         6.90 cm      Diastology LVIDs:         6.20 cm      LV e' medial:    4.57 cm/s LV PW:         1.20 cm      LV E/e' medial:  21.7 LV IVS:        1.30 cm      LV e' lateral:   7.07 cm/s LVOT diam:     2.80 cm      LV E/e' lateral: 14.0 LV SV:         80 LV SV Index:   33 LVOT Area:     6.16 cm  LV Volumes (MOD) LV vol d, MOD A2C: 192.0 ml LV vol d, MOD A4C: 241.0 ml LV vol s, MOD A2C: 140.0 ml LV vol s, MOD A4C: 192.0 ml LV SV MOD A2C:     52.0 ml LV SV MOD A4C:     241.0 ml LV SV MOD BP:      49.0 ml RIGHT VENTRICLE RV S prime:     9.57 cm/s TAPSE (M-mode): 2.1 cm LEFT ATRIUM              Index         RIGHT ATRIUM           Index LA diam:        5.50 cm  2.23 cm/m    RA Area:     25.40 cm LA Vol (A2C):   249.0 ml 101.15 ml/m  RA Volume:   92.00 ml  37.37 ml/m LA Vol (A4C):   201.0 ml 81.65 ml/m LA Biplane Vol: 236.0 ml 95.87 ml/m  AORTIC VALVE LVOT Vmax:   52.90 cm/s LVOT Vmean:  33.000 cm/s LVOT VTI:    0.130 m AI PHT:       454 msec  AORTA Ao Root diam: 4.10 cm Ao Asc diam:  3.85 cm MR Peak grad:    101.6 mmHg   TRICUSPID VALVE MR Mean grad:    62.0 mmHg    TR Peak grad:   28.1 mmHg MR Vmax:         504.00 cm/s  TR Vmax:        265.00 cm/s MR Vmean:        366.0 cm/s MR PISA:         3.08 cm     SHUNTS MR PISA Eff ROA: 22 mm       Systemic VTI:  0.13 m  MR PISA Radius:  0.70 cm      Systemic Diam: 2.80 cm MV E velocity: 99.00 cm/s Cherlynn Kaiser MD Electronically signed by Cherlynn Kaiser MD Signature Date/Time: 05/07/2022/4:11:05 PM    Final (Updated)      Assessment and Plan:   Acute on chronic combined systolic/diastolic CHF -2D echo this admission showed worsening LV dysfunction with EF now 20 to 25% (was 30 to 35% by echo 2015 and improved to 55-60% later in 2015 but then most recent echo 45-50% prior to admission) -he has hx of chronic LE edema and chronic DOE -BNP was markedly elevated on admission at 2258. -Chest x-ray with mild pulmonary vascular congestion and small bilateral pleural effusions consistent with acute CHF -? If I&O's incomplete -has evidence of poor peripheral perfusion with cool feet and microhemorrhages on several toes>>no evidence of vegetation on echo but he does have UTI and so need to consider endocarditis.  Will check blood cx (this is a clinical dx) and ESR.  Consider LE arterial dopplers. -would change PO to IV lasix 31m IV BID -follow strict I&O's, daily weights and renal function while diuresing -this has occurred in setting of acute hyperthyroidism which may be contributing to volume overload -hold BB due to current bradycardia -continue spiro 245mdaily  -had been on ARB but was stopped due to soft BP>>will hold on starting ARB for LV dysfunction -given current encephalopathy he is not a candidate for further ischemic evaluation at present  2.  PAF -maintaining sinus bradycardia on tele -He was on Toprol Xl 20034maily at home but stopped due to bradycardia -Amio  stopped due to hyperthyroidism -continue Apixaban 5mg28mD  3.  HTN -BP fairly well controlled on exam today -continue spiro  4.  Chronic LE edema -continue compression hose/diuretics  5.  Hyperthyroidism -TSH <0.01 and T4 high at 4 and FT3 high at 5 -Amio stopped -on Methimazole by TR[facility]  6.  Elevated troponin -minimally elevated with flat trend likely related to demand ischemia in setting of hyperthyroidism -2D echo this admission with worsening LVF (normal coronary arteries by cath in 2014) but given his dementia and seve obtundation at this time he is not a candidate for further ischemic workup   -Will follow closely to see how much his mental status improves   7. Acute on chronic metabolic encephalopathy -related to dementia, recent addition of Seoquel, hyperthyroidism, UTI -currently obtunded  -per TRH  New York Heart Association (NYHA) Functional Class NYHA Class II  CHA2DS2-VASc Score = 5   This indicates a 7.2% annual risk of stroke. The patient's score is based upon: CHF History: 1 HTN History: 1 Diabetes History: 1 Stroke History: 0 Vascular Disease History: 0 Age Score: 2 Gender Score: 0       For questions or updates, please contact [facility] Fortase consult [email or web address] for contact info under    Signed, TracFransico Him  05/11/2022 2:06 PM

## 2022-05-11 NOTE — Progress Notes (Addendum)
[facility] Progress Note Patient Name: [name] DOB: Jan 13, 1946 MRN: [id number]   Date of Service  05/11/2022  HPI/Events of Note  Multiple issues: 1. ABG on [place] O2 = 7.28/61/115/28.9 in setting of acute on chronic hypoxic and hypercarbic respiratory failure. BMI = 33.74. Unclear if he uses CPAP or BiPAP at home. 2. Hypotension - BP = 101/45 with MAP = 62. LVEF = 25-30%.  eICU Interventions  Plan: Continue present respiratory management.  25% Albumin 12.5 gm IV now.      Intervention Category Major Interventions: Other:  Lysle Dingwall 05/11/2022, 9:47 PM

## 2022-05-11 NOTE — Progress Notes (Signed)
NG placement attempted by RN x2. Patient is very agitated at this time, and is coughing up thick secretions. This RN will wait until coughing subsides before attempting NG tube placement again.

## 2022-05-11 NOTE — Progress Notes (Signed)
OT Cancellation Note  Patient Details Name: [name] MRN: [id number] DOB: [birth year]   Cancelled Treatment:    Reason Eval/Treat Not Completed: Fatigue/lethargy limiting ability to participate Patient noted to have ativan during PM hours with patient asleep at this time. OT to continue to follow and check back as schedule will allow Jackelyn Poling OTR/L, MS Acute Rehabilitation Department Office# [phone] Pager# [phone] 05/11/2022, 10:30 AM

## 2022-05-12 ENCOUNTER — Inpatient Hospital Stay (HOSPITAL_COMMUNITY)
Admit: 2022-05-12 | Discharge: 2022-05-12 | Disposition: A | Discharge status: Home / Self Care | Payer: Medicare Other | Attending: Internal Medicine | Admitting: Internal Medicine

## 2022-05-12 DIAGNOSIS — I5022 Chronic systolic (congestive) heart failure: Secondary | ICD-10-CM

## 2022-05-12 DIAGNOSIS — R4182 Altered mental status, unspecified: Secondary | ICD-10-CM | POA: Diagnosis not present

## 2022-05-12 DIAGNOSIS — G9341 Metabolic encephalopathy: Secondary | ICD-10-CM | POA: Diagnosis not present

## 2022-05-12 DIAGNOSIS — R778 Other specified abnormalities of plasma proteins: Secondary | ICD-10-CM | POA: Diagnosis not present

## 2022-05-12 DIAGNOSIS — E0590 Thyrotoxicosis, unspecified without thyrotoxic crisis or storm: Secondary | ICD-10-CM | POA: Diagnosis not present

## 2022-05-12 LAB — CBC
HCT: 47.8 % (ref 39.0–52.0)
Hemoglobin: 14.7 g/dL (ref 13.0–17.0)
MCH: 29 pg (ref 26.0–34.0)
MCHC: 30.8 g/dL (ref 30.0–36.0)
MCV: 94.3 fL (ref 80.0–100.0)
Platelets: 217 10*3/uL (ref 150–400)
RBC: 5.07 MIL/uL (ref 4.22–5.81)
RDW: 15.9 % — ABNORMAL HIGH (ref 11.5–15.5)
WBC: 10.9 10*3/uL — ABNORMAL HIGH (ref 4.0–10.5)
nRBC: 0 % (ref 0.0–0.2)

## 2022-05-12 LAB — GLUCOSE, CAPILLARY
Glucose-Capillary: 79 mg/dL (ref 70–99)
Glucose-Capillary: 68 mg/dL — ABNORMAL LOW (ref 70–99)
Glucose-Capillary: 79 mg/dL (ref 70–99)
Glucose-Capillary: 72 mg/dL (ref 70–99)
Glucose-Capillary: 78 mg/dL (ref 70–99)
Glucose-Capillary: 151 mg/dL — ABNORMAL HIGH (ref 70–99)

## 2022-05-12 LAB — COMPREHENSIVE METABOLIC PANEL
ALT: 16 U/L (ref 0–44)
AST: 17 U/L (ref 15–41)
Albumin: 2.9 g/dL — ABNORMAL LOW (ref 3.5–5.0)
Alkaline Phosphatase: 56 U/L (ref 38–126)
Anion gap: 12 (ref 5–15)
BUN: 33 mg/dL — ABNORMAL HIGH (ref 8–23)
CO2: 27 mmol/L (ref 22–32)
Calcium: 8.4 mg/dL — ABNORMAL LOW (ref 8.9–10.3)
Chloride: 107 mmol/L (ref 98–111)
Creatinine, Ser: 1.63 mg/dL — ABNORMAL HIGH (ref 0.61–1.24)
GFR, Estimated: 43 mL/min — ABNORMAL LOW (ref >60)
Glucose, Bld: 86 mg/dL (ref 70–99)
Potassium: 3.8 mmol/L (ref 3.5–5.1)
Sodium: 146 mmol/L — ABNORMAL HIGH (ref 135–145)
Total Bilirubin: 0.7 mg/dL (ref 0.3–1.2)
Total Protein: 5.7 g/dL — ABNORMAL LOW (ref 6.5–8.1)

## 2022-05-12 LAB — T3, FREE: T3, Free: 3.6 pg/mL (ref 2.0–4.4)

## 2022-05-12 LAB — MAGNESIUM: Magnesium: 2.1 mg/dL (ref 1.7–2.4)

## 2022-05-12 LAB — PROCALCITONIN: Procalcitonin: <0.1 ng/mL

## 2022-05-12 LAB — BRAIN NATRIURETIC PEPTIDE: B Natriuretic Peptide: 1314.2 pg/mL — ABNORMAL HIGH (ref 0.0–100.0)

## 2022-05-12 LAB — PHOSPHORUS: Phosphorus: 5.4 mg/dL — ABNORMAL HIGH (ref 2.5–4.6)

## 2022-05-12 LAB — CK TOTAL AND CKMB (NOT AT ARMC)
CK, MB: 1.8 ng/mL (ref 0.5–5.0)
Relative Index: INVALID (ref 0.0–2.5)
Total CK: 18 U/L — ABNORMAL LOW (ref 49–397)

## 2022-05-12 LAB — LACTIC ACID, PLASMA: Lactic Acid, Venous: 1.6 mmol/L (ref 0.5–1.9)

## 2022-05-12 MED ORDER — DEXTROSE 50 % IV SOLN
12.5000 g | INTRAVENOUS | Status: AC
Start: 2022-05-12 — End: 2022-05-12
  Administered 2022-05-12: 12.5 g via INTRAVENOUS
  Filled 2022-05-12: qty 50

## 2022-05-12 MED ORDER — CALCIUM CARBONATE 1250 (500 CA) MG PO TABS
1.0000 | ORAL_TABLET | Freq: Three times a day (TID) | ORAL | Status: DC
  Administered 2022-05-12 – 2022-05-23 (×30): 1250 mg via ORAL
  Filled 2022-05-12 (×31): qty 1

## 2022-05-12 NOTE — Progress Notes (Signed)
[facility] Progress Note Patient Name: [name] DOB: 07-28-1946 MRN: [id number]   Date of Service  05/12/2022  HPI/Events of Note  Multiple issues: 1. Na+ = 146 related to diuresis with Lasix and Spironolactone yesterday. 2. Phosphorus = 5.4.  eICU Interventions  Plan: PCCM rounding team to assess the need for onging diuresis.  Oscal (Calcium Carbonate) 1 tablet PO TID.     Intervention Category Major Interventions: Electrolyte abnormality - evaluation and management  [name] [place] 05/12/2022, 6:25 AM

## 2022-05-12 NOTE — TOC Initial Note (Signed)
Transition of Care [facility]) - Initial/Assessment Note   Patient Details  Name: [name] MRN: [id number] Date of Birth: [birth year]  Transition of Care [facility]) CM/SW Contact:    Sherie Don, LCSW Phone Number: 05/12/2022, 2:39 PM  Clinical Narrative: OT evaluation recommended SNF. CSW awaiting PT evaluation. Patient is not medically close to discharge, so TOC will continue to follow. Please consult TOC for SNF if recommended by PT and patient is closer to being medically ready for discharge.  Expected Discharge Plan: Skilled Nursing Facility Barriers to Discharge: Continued Medical Work up  Expected Discharge Plan and Services Expected Discharge Plan: [place] In-house Referral: Clinical Social Work Post Acute Care Choice: [place] Living arrangements for the past 2 months: [place]  Prior Living Arrangements/Services Living arrangements for the past 2 months: Single Family Home Lives with:: Spouse Patient language and need for interpreter reviewed:: Yes Need for Family Participation in Patient Care: Yes (Comment) Care giver support system in place?: Yes (comment) Criminal Activity/Legal Involvement Pertinent to Current Situation/Hospitalization: No - Comment as needed  Activities of Daily Living Home Assistive Devices/Equipment: Cane (specify quad or straight), Eyeglasses (reading glasses) ADL Screening (condition at time of admission) Patient's cognitive ability adequate to safely complete daily activities?: No Is the patient deaf or have difficulty hearing?: No Does the patient have difficulty seeing, even when wearing glasses/contacts?: Yes Does the patient have difficulty concentrating, remembering, or making decisions?: Yes Patient able to express need for assistance with ADLs?: No Does the patient have difficulty dressing or bathing?: Yes Independently performs ADLs?: No Communication: Independent Dressing (OT): Needs  assistance Is this a change from baseline?: Pre-admission baseline Grooming: Needs assistance Is this a change from baseline?: Pre-admission baseline Feeding: Needs assistance Is this a change from baseline?: Pre-admission baseline Bathing: Needs assistance Is this a change from baseline?: Pre-admission baseline Toileting: Needs assistance Is this a change from baseline?: Pre-admission baseline In/Out Bed: Needs assistance Is this a change from baseline?: Pre-admission baseline Walks in Home: Needs assistance Is this a change from baseline?: Pre-admission baseline Does the patient have difficulty walking or climbing stairs?: Yes Weakness of Legs: Both Weakness of Arms/Hands: Both  Emotional Assessment Attitude/Demeanor/Rapport: Unable to Assess Affect (typically observed): Unable to Assess Orientation: : Oriented to Self Alcohol / Substance Use: Not Applicable  Admission diagnosis:  Hallucinations [R44.3] SIRS (systemic inflammatory response syndrome) (HCC) [R65.10] Acute cystitis without hematuria [W09.81] Acute metabolic encephalopathy [X91.47] Dementia without behavioral disturbance, psychotic disturbance, mood disturbance, or anxiety, unspecified dementia severity, unspecified dementia type ([place]) [F03.90] Patient Active Problem List   Diagnosis Date Noted   Hyperthyroidism 82/95/6213   Acute metabolic encephalopathy 08/65/7846   Dementia with behavioral disturbance ([place]) 05/06/2022   Stage 3a chronic kidney disease (CKD) ([place]) 05/06/2022   Elevated troponin 05/06/2022   Elevated d-dimer 05/06/2022   Positive colorectal cancer screening using Cologuard test 11/02/2021   Chronic anticoagulation 11/02/2021   Cellulitis of right lower leg 10/07/2019   Chronic edema 05/17/2019   PAF (paroxysmal atrial fibrillation) ([place]) 04/10/2019   Chronic HFrEF (heart failure with reduced ejection fraction) ([place]) 04/08/2019   Altered mental status 04/07/2019   Psoriasis of scalp  08/15/2018   Anxiety, generalized 01/02/2017   DM2 (diabetes mellitus, type 2) ([place]) 11/25/2014   NICM (nonischemic cardiomyopathy) ([place]) 09/09/2013   HLD (hyperlipidemia) 09/09/2013   OSA (obstructive sleep apnea) 96/29/5284   Acute systolic CHF (congestive heart failure), NYHA class 2 ([place]) 08/27/2013   LEG EDEMA 09/14/2009  Lymphoma, non-Hodgkin's ([place]) 09/24/2008   VERTIGO 09/24/2008   ABDOMINAL MASS 10/26/2007   Essential hypertension 10/23/2007   NEPHROLITHIASIS 10/23/2007   HEADACHE 06/07/2007   PCP:  [name], MD Pharmacy:   [id number] [id number]: [phone]: [phone] Readmission Risk Interventions     No data to display

## 2022-05-12 NOTE — Progress Notes (Signed)
Progress Note  Patient Name: [name] Date of Encounter: 05/12/2022  [facility] Cardiologist: Buford Dresser, MD   Subjective   Can answer simple questions, denies chest pain, no SOB, no palpitations  CXR w/ edema last pm, got Lasix 40 mg x 1 last pm, UOP after that 400 cc, was 1050 cc/day  Inpatient Medications    Scheduled Meds:  apixaban  5 mg Oral BID   atorvastatin  20 mg Oral Daily   calcium carbonate  1 tablet Oral TID WC   Chlorhexidine Gluconate Cloth  6 each Topical Daily   cholestyramine light  4 g Oral QID   feeding supplement  237 mL Oral BID BM   FLUoxetine  40 mg Oral Daily   insulin aspart  0-15 Units Subcutaneous Q4H   methimazole  10 mg Oral TID   multivitamin with minerals  1 tablet Oral Daily   mupirocin ointment  1 Application Nasal BID   Continuous Infusions:  PRN Meds: acetaminophen **OR** acetaminophen, mouth rinse, mouth rinse   Vital Signs    Vitals:   05/12/22 0530 05/12/22 0600 05/12/22 0630 05/12/22 0700  BP: (!) 116/56 125/60 (!) 113/50 117/60  Pulse: 62 (!) 46 61 (!) 44  Resp: '16 18 17 16  '$ Temp:      TempSrc:      SpO2: 97% 99% 98% 97%  Weight:      Height:        Intake/Output Summary (Last 24 hours) at 05/12/2022 0757 Last data filed at 05/12/2022 0346 Gross per 24 hour  Intake --  Output 1050 ml  Net -1050 ml      05/12/2022    3:46 AM 05/11/2022    3:00 PM 05/10/2022    4:38 AM  Last 3 Weights  Weight (lbs) 261 lb 0.4 oz 262 lb 12.6 oz 265 lb 3.4 oz  Weight (kg) 118.4 kg 119.2 kg 120.3 kg      Telemetry    SR, Sinus brady sustained in the 40s, occ PVCs - Personally Reviewed  ECG    None today - Personally Reviewed  Physical Exam   GEN: No acute distress.   Neck: No JVD Cardiac: RRR, no murmurs, rubs, or gallops.  Respiratory: R>L rales bases bilaterally. GI: Soft, nontender, non-distended  MS: No edema; No deformity. Neuro:  Nonfocal  Psych: sleepy affect   Labs    ABG     Component Value Date/Time   PHART 7.28 (L) 05/11/2022 2054   PCO2ART 61 (H) 05/11/2022 2054   PO2ART 115 (H) 05/11/2022 2054   HCO3 28.9 (H) 05/11/2022 2054   TCO2 29 08/30/2013 1316   ACIDBASEDEF 0.7 05/11/2022 1250   O2SAT 100 05/11/2022 2054    High Sensitivity Troponin:   Recent Labs  Lab 05/06/22 1012 05/06/22 1140  TROPONINIHS 83* 86*     Chemistry Recent Labs  Lab 05/06/22 1012 05/07/22 0701 05/08/22 0829 05/10/22 0843 05/11/22 0834 05/11/22 1429 05/12/22 0302  NA 145 143   < > 141 142  --  146*  K 3.6 3.7   < > 3.3* 4.0  --  3.8  CL 110 106   < > 107 108  --  107  CO2 25 26   < > 24 22  --  27  GLUCOSE 102* 94   < > 106* 120*  --  86  BUN 26* 28*   < > 29* 32*  --  33*  CREATININE 1.48* 1.51*   < >  1.40* 1.49*  --  1.63*  CALCIUM 8.9 9.0   < > 8.6* 8.7*  --  8.4*  MG  --  1.9  --   --   --  2.1 2.1  PROT 6.0* 6.2*  --   --   --   --  5.7*  ALBUMIN 3.1* 3.1*  --   --   --   --  2.9*  AST 16 15  --   --   --   --  17  ALT 14 14  --   --   --   --  16  ALKPHOS 61 66  --   --   --   --  56  BILITOT 0.8 0.9  --   --   --   --  0.7  GFRNONAA 49* 48*   < > 52* 49*  --  43*  ANIONGAP 10 11   < > 10 12  --  12   < > = values in this interval not displayed.    Lipids No results for input(s): "CHOL", "TRIG", "HDL", "LABVLDL", "LDLCALC", "CHOLHDL" in the last 168 hours.  Hematology Recent Labs  Lab 05/10/22 0843 05/11/22 0834 05/12/22 0302  WBC 8.7 10.0 10.9*  RBC 5.07 5.33 5.07  HGB 14.6 15.3 14.7  HCT 46.3 48.7 47.8  MCV 91.3 91.4 94.3  MCH 28.8 28.7 29.0  MCHC 31.5 31.4 30.8  RDW 15.7* 15.7* 15.9*  PLT 253 298 217   Thyroid  Recent Labs  Lab 05/06/22 1012 05/06/22 1715 05/11/22 0929  TSH <0.010*  --   --   FREET4  --    < > 3.16*   < > = values in this interval not displayed.    Free T4  Date Value Ref Range Status  05/11/2022 3.16 (H) 0.61 - 1.12 ng/dL Final    Comment:    (NOTE) Biotin ingestion may interfere with free T4 tests. If the  results are inconsistent with the TSH level, previous test results, or the clinical presentation, then consider biotin interference. If needed, order repeat testing after stopping biotin. Performed at [facility], [place] [address], [place], [place] [address]   05/06/2022 4.01 (H) 0.61 - 1.12 ng/dL Final    Comment:    (NOTE) Biotin ingestion may interfere with free T4 tests. If the results are inconsistent with the TSH level, previous test results, or the clinical presentation, then consider biotin interference. If needed, order repeat testing after stopping biotin. Performed at [facility], [place] [address] [place], [place] [address]     BNP Recent Labs  Lab 05/06/22 1140  BNP 2,258.1*    DDimer  Recent Labs  Lab 05/06/22 1140  DDIMER 1.17*    Lab Results  Component Value Date   ESRSEDRATE 0 05/11/2022     Radiology    DG Abd Portable 1V  Result Date: 05/11/2022 CLINICAL DATA:  NG tube placement EXAM: PORTABLE ABDOMEN - 1 VIEW COMPARISON:  Chest 05/11/2022 FINDINGS: No enteric tube is demonstrated within the field of view. Visualized bowel gas pattern is normal with scattered gas in the colon. No small or large bowel distention. Surgical clips consistent with mesh hernia repair. IMPRESSION: No enteric tube demonstrated within the field of view. Electronically Signed   By: Lucienne Capers M.D.   On: 05/11/2022 17:56   DG CHEST PORT 1 VIEW  Result Date: 05/11/2022 CLINICAL DATA:  Encounter for hypoxemia EXAM: PORTABLE CHEST 1 VIEW COMPARISON:  Chest radiograph dated May 06, 2022 FINDINGS: The heart is enlarged. Bilateral lower lobe hazy opacities concerning for pulmonary edema or infiltrate. Small to moderate bilateral pleural effusions. Thoracic spondylosis. No acute osseous abnormality. IMPRESSION: 1. Stable cardiomegaly. 2. Bilateral perihilar hazy opacities with pleural effusions suggesting pulmonary edema, suggesting worsening of CHF or development of  airspace disease, clinical correlation is suggested. Electronically Signed   By: Keane Police D.O.   On: 05/11/2022 14:24    Cardiac Studies   ECHO: 05/07/2022 IMPRESSIONS   1. Left ventricular ejection fraction, by estimation, is 25 to 30%. The left ventricle has severely decreased function. The left ventricle  demonstrates global hypokinesis. There is mild left ventricular  hypertrophy. Left ventricular diastolic parameters  are consistent with Grade III diastolic dysfunction (restrictive).   2. Right ventricular systolic function is mildly reduced. The right  ventricular size is mildly enlarged.   3. Left atrial size was severely dilated.   4. Right atrial size was mild to moderately dilated.   5. The mitral valve is normal in structure. Mild to moderate mitral valve regurgitation. No evidence of mitral stenosis.   6. The aortic valve is tricuspid. There is mild calcification of the  aortic valve. There is mild thickening of the aortic valve. Aortic valve regurgitation is mild. No aortic stenosis is present.   7. Aortic dilatation noted. There is borderline dilatation of the aortic  root, measuring 41 mm.   Patient Profile     76 y.o. male with a hx of chronic systolic CHF with EF 35 to 40% and diffuse hypokinesis in 2014 with normal coronary arteries by cath at that time, PAF, diabetes, hyperlipidemia, hypertension, non-Hodgkin's lymphoma, dementia, and obstructive sleep apnea, was admitted 07/21 with a UTI, no c/o chest pain. Trop elevated >> echo w/ EF now 25-30%, Cards asked to see on 07/26.  Assessment & Plan    Acute on Chronic combined CHF - wt 268 on admit, 261 today - ABG initially on 07/26, pH 7.33, PCO2 49, PO2 66, O2 sats 92% - he was changed from a venti mask to a n/c and ABG later is above, pH 7.28, PCO2 61, PO2 115, BICARB 28.9, O2 100% - no dx COPD, but > 100 pack yr hx tob use, quit 2000 but restarted recently - after his ABG was reviewed, he got 12.5 gm albumin IV -  got Lasix 40 mg IV last pm, UOP 600 cc after that, bladder scan requested since scan on 07/25 had > 400 cc, does not have foley - after the Lasix, Na, Cr up some, BUN minimal change - once good bladder emptying confirmed, Lasix 60 mg IV BID x 24-48 hr - recheck BNP and labs in am - if Cr higher in am, may need central line to measure R heart pressures  2. Hyperthyroid - pta was on amio >> d/c'd - has been started on methimazole, freeT4 trending down - per IM  3. PAF, sinus brady - Amio d/c'd - was on metop 200 mg qd pta, last dose 07/24 - had propranolol 10 mg qd added to meds on 07/23, last dose 07/24 - still w/ sinus brady overnight in the 40s, HR is higher during the day, with stimulus - no discernable sx, follow on telemetry - continue Eliquis   Otherwise, per IM  For questions or updates, please contact [facility] Please consult [email or web address] for contact info under        Signed, Rosaria Ferries, PA-C  05/12/2022, 7:57 AM

## 2022-05-12 NOTE — Progress Notes (Signed)
EEG complete - results pending 

## 2022-05-12 NOTE — Progress Notes (Signed)
PROGRESS NOTE    [name]  EEF:[id number] DOB: 02-Aug-1946 DOA: 05/06/2022 PCP: [name], MD   Brief Narrative: 76 year old with past medical history significant for PAF on Eliquis, hyperlipidemia, diabetes type 2, chronic heart failure reduced ejection fraction 45%, dementia.  Patient presented with altered mental status, increased confusion over the last several days.  He has been having hallucination.  Patient was a started on Seroquel a couple of days prior to admission.  He has been following with his PCP for worsening dementia.  Evaluation in the ED GL hypothyroidism, with a TSH of 0.01, free T4 at 4.0, free T3 at 5.  He was started on methimazole and amiodarone was discontinued.  Patient had some SVT , 2D echo showed lower ejection fraction to 25 to 30%.  Global hypokinesis.  Cardiology has been consulted.  He has been confused, and agitated.  MRI was ordered 7/25th.  Patient received IV Ativan, he became more lethargic 7/26 after he received ativan  from prior night. ABG showed acute hypoxic, hypercapnic respiratory failure. He was transfer to ICU. CCM consulted. Patient was placed on supplemental oxygen. His mental status has improved, his respiratory failure has improved as well.    Assessment & Plan:   Principal Problem:   Hyperthyroidism Active Problems:   Essential hypertension   HLD (hyperlipidemia)   DM2 (diabetes mellitus, type 2) (HCC)   Chronic HFrEF (heart failure with reduced ejection fraction) (HCC)   PAF (paroxysmal atrial fibrillation) (HCC)   Acute metabolic encephalopathy   Dementia with behavioral disturbance (HCC)   Stage 3a chronic kidney disease (CKD) (HCC)   Elevated troponin   Elevated d-dimer  1-Hyperthyroidism: -Patient presented with a TSH of 0.01, free T3 at 5, free T4 at 4. -Patient was a started on methimazole and propanolol. -Hold on BB  due to bradycardia -Repeated free T3 : and free T4 down to 3.1. -Started  patient on  cholestyramine 4 g 4 times a day.    2-Acute metabolic encephalopathy -In the setting of hyperthyroidism -Hopefully with treating hyperthyroidism confusion will improve. -MRI ordered, he got agitated and unable to complete MRI.  He received Ativan -He bev=came more  lethargic  after he received Ativan.  -ABG showed acute hypoxic , hypercapnic resp failure.  -Avoid benzos.  -continue with Air cabin crew.  -Repeated Blood culture: No growth to date.  -Check pro-clacitonin.   3-PAF: -Metoprolol held due to Bradycardia.  -Amiodarone discontinue due to hyperthyroidism.  -Cardiology consulted, follow input.   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  Cardiology consulted. Appreciate assistance.   5-Acute Hypoxic Respiratory Failure;  Patient more lethargic 7/26.  Secondary to Ativan, hypoventilation. .  ABG showed PO2: 66, PCO2 49. Chest x ray: pulmonary edema vs infiltrates. Received one dose of IV lasix 7/26.Marland Kitchen  CCM consulted. Recommended Oxygen supplementation, transfer to ICU.  He is more alert this am. Down on 2 L oxygen.  Check BNP.   Diabetes type 2:  Hypoglycemia; due to poor oral intake. Monitor.  Continue with a sliding scale insulin  Dementia with behavioral disturbance.  Seroquel has been on hold, he started to have hallucination after he was started on Seroquel. Delirium precaution.   Hyperlipidemia: Continue with Lipitor  Hypertension: On spironolactone and Lasix Elevated D-dimer: Doppler negative for DVT Continue with Eliquis, he was already on Eliquis for PAF  Hypokalemia:Replaced.   Generalized weakness: PT , OT   AKI on CKD stage IIIa;  Cr Peal to 1.6 Has urine retention, foley catheter placed.  Hold diuretics.   30,000 staph in urine. Blood culture negative   Nutrition Problem:  Increased nutrient needs Etiology: acute illness    Signs/Symptoms: estimated needs    Interventions: Ensure Enlive (each supplement provides 350kcal and 20 grams of protein), Liberalize Diet  Estimated body mass index is 33.51 kg/m as calculated from the following:   Height as of this encounter: '6\' 2"'$  (1.88 m).   Weight as of this encounter: 118.4 kg.   DVT prophylaxis: Eliquis Code Status: Full code Family Communication: Wife at bedside. Will consult palliative for goals of care.  Disposition Plan:  Status is: Inpatient Remains inpatient appropriate because: management of hyperthyroidism     Consultants:  Cardiology  Procedures:  ECHO  Antimicrobials:    Subjective: He is alert today. He develops urine retention. Foley catheter placed.  He is confuse, started to get agitated again.   Objective: Vitals:   05/12/22 0500 05/12/22 0530 05/12/22 0600 05/12/22 0630  BP: (!) 112/46 (!) 116/56 125/60 (!) 113/50  Pulse: (!) 50 62 (!) 46 61  Resp: '19 16 18 17  '$ Temp:      TempSrc:      SpO2: 98% 97% 99% 98%  Weight:      Height:        Intake/Output Summary (Last 24 hours) at 05/12/2022 0710 Last data filed at 05/12/2022 0346 Gross per 24 hour  Intake --  Output 1050 ml  Net -1050 ml    Filed Weights   05/10/22 0438 05/11/22 1500 05/12/22 0346  Weight: 120.3 kg 119.2 kg 118.4 kg    Examination:  General exam: NAD Respiratory system: CTA Cardiovascular system: S 1, S 2 RRR Gastrointestinal system: BS present, soft, nt Central nervous system: alert, follows some command Extremities: no edema, Hyperpigmentation Lower extremities.    Data Reviewed: I have personally reviewed following labs and imaging studies  CBC: Recent Labs  Lab 05/06/22 1012 05/07/22 0701 05/08/22 0829 05/09/22 0529 05/10/22 0843 05/11/22 0834 05/12/22 0302  WBC 6.1   < > 7.7 8.5 8.7 10.0 10.9*  NEUTROABS 4.2  --  5.9 6.7 6.8  --   --   HGB 13.6   < > 14.7 14.5 14.6 15.3  14.7  HCT 42.4   < > 46.5 46.0 46.3 48.7 47.8  MCV 91.0   < > 91.2 90.9 91.3 91.4 94.3  PLT 254   < > 273 298 253 298 217   < > = values in this interval not displayed.    Basic Metabolic Panel: Recent Labs  Lab 05/07/22 0701 05/08/22 0829 05/09/22 0529 05/10/22 0843 05/11/22 0834 05/11/22 1429 05/12/22 0302  NA 143 144 141 141 142  --  146*  K 3.7 4.0 3.7 3.3* 4.0  --  3.8  CL 106 108 108 107 108  --  107  CO2 '26 25 23 24 22  '$ --  27  GLUCOSE 94 95 138* 106* 120*  --  86  BUN 28* 27* 27* 29* 32*  --  33*  CREATININE 1.51* 1.45* 1.52* 1.40* 1.49*  --  1.63*  CALCIUM 9.0 9.2 9.1 8.6* 8.7*  --  8.4*  MG 1.9  --   --   --   --  2.1 2.1  PHOS  --   --   --   --   --   --  5.4*    GFR: Estimated Creatinine Clearance: 52.7 mL/min (A) (by C-G formula based on SCr of 1.63 mg/dL (H)). Liver Function Tests: Recent Labs  Lab 05/06/22 1012 05/07/22 0701 05/12/22 0302  AST '16 15 17  '$ ALT '14 14 16  '$ ALKPHOS 61 66 56  BILITOT 0.8 0.9 0.7  PROT 6.0* 6.2* 5.7*  ALBUMIN 3.1* 3.1* 2.9*    No results for input(s): "LIPASE", "AMYLASE" in the last 168 hours. Recent Labs  Lab 05/06/22 1012  AMMONIA 21    Coagulation Profile: No results for input(s): "INR", "PROTIME" in the last 168 hours. Cardiac Enzymes: Recent Labs  Lab 05/12/22 0302  CKTOTAL 18*  CKMB 1.8   BNP (last 3 results) No results for input(s): "PROBNP" in the last 8760 hours. HbA1C: No results for input(s): "HGBA1C" in the last 72 hours. CBG: Recent Labs  Lab 05/11/22 1141 05/11/22 1615 05/11/22 2006 05/11/22 2327 05/12/22 0351  GLUCAP 120* 106* 99 99 79    Lipid Profile: No results for input(s): "CHOL", "HDL", "LDLCALC", "TRIG", "CHOLHDL", "LDLDIRECT" in the last 72 hours. Thyroid Function Tests: Recent Labs    05/11/22 0929  FREET4 3.16*   Anemia Panel: Recent Labs    05/11/22 0936  VITAMINB12 1,049*   Sepsis Labs: Recent Labs  Lab 05/06/22 1507 05/06/22 1715 05/11/22 1805  05/12/22 0302  LATICACIDVEN 2.4* 1.8 1.5 1.6     Recent Results (from the past 240 hour(s))  Resp Panel by RT-PCR (Flu A&B, Covid) Anterior Nasal Swab     Status: None   Collection Time: 05/06/22 10:12 AM   Specimen: Anterior Nasal Swab  Result Value Ref Range Status   SARS Coronavirus 2 by RT PCR NEGATIVE NEGATIVE Final    Comment: (NOTE) SARS-CoV-2 target nucleic acids are NOT DETECTED.  The SARS-CoV-2 RNA is generally detectable in upper respiratory specimens during the acute phase of infection. The lowest concentration of SARS-CoV-2 viral copies this assay can detect is 138 copies/mL. A negative result does not preclude SARS-Cov-2 infection and should not be used as the sole basis for treatment or other patient management decisions. A negative result may occur with  improper specimen collection/handling, submission of specimen other than nasopharyngeal swab, presence of viral mutation(s) within the areas targeted by this assay, and inadequate number of viral copies(<138 copies/mL). A negative result must be combined with clinical observations, patient history, and epidemiological information. The expected result is Negative.  Fact Sheet for Patients:  EntrepreneurPulse.com.au  Fact Sheet for Healthcare Providers:  IncredibleEmployment.be  This test is no t yet approved or cleared by the [place] FDA and  has been authorized for detection and/or diagnosis of SARS-CoV-2 by FDA under an Emergency Use Authorization (EUA). This EUA will remain  in effect (meaning this test can be used) for the duration of the COVID-19 declaration under Section 564(b)(1) of the Act, 21 U.S.C.section 360bbb-3(b)(1), unless the authorization is terminated  or revoked sooner.       Influenza A by PCR NEGATIVE NEGATIVE Final   Influenza B by PCR NEGATIVE NEGATIVE Final    Comment: (NOTE) The Xpert Xpress SARS-CoV-2/FLU/RSV plus assay is intended as an  aid in the diagnosis of influenza from Nasopharyngeal swab specimens and should not be used as a sole basis for treatment. Nasal washings and aspirates are unacceptable for Xpert Xpress SARS-CoV-2/FLU/RSV testing.  Fact Sheet for Patients: EntrepreneurPulse.com.au  Fact Sheet for Healthcare Providers: IncredibleEmployment.be  This test is not yet approved or cleared by the [place] and  has been authorized for detection and/or diagnosis of SARS-CoV-2 by FDA under an Emergency Use Authorization (EUA). This EUA will remain in effect (meaning this test can be used) for the duration of the COVID-19 declaration under Section 564(b)(1) of the Act, 21 U.S.C. section 360bbb-3(b)(1), unless the authorization is terminated or revoked.  Performed at [facility], [place] [address], [place], [place] [address]   Urine Culture     Status: Abnormal   Collection Time: 05/06/22 11:50 AM   Specimen: Urine, Catheterized  Result Value Ref Range Status   Specimen Description   Final    URINE, CATHETERIZED Performed at [facility] [address], [place], [place] [address]    Special Requests   Final    NONE Performed at [facility], [place] [address] [place], [place] [address]    Culture 30,000 COLONIES/mL STAPHYLOCOCCUS SIMULANS (A)  Final   Report Status 05/08/2022 FINAL  Final   Organism ID, Bacteria STAPHYLOCOCCUS SIMULANS (A)  Final      Susceptibility   Staphylococcus simulans - MIC*    CIPROFLOXACIN <=0.5 SENSITIVE Sensitive     GENTAMICIN <=0.5 SENSITIVE Sensitive     NITROFURANTOIN <=16 SENSITIVE Sensitive     OXACILLIN <=0.25 SENSITIVE Sensitive     TETRACYCLINE <=1 SENSITIVE Sensitive     VANCOMYCIN <=0.5 SENSITIVE Sensitive     TRIMETH/SULFA <=10 SENSITIVE Sensitive     CLINDAMYCIN <=0.25 SENSITIVE Sensitive     RIFAMPIN <=0.5 SENSITIVE Sensitive     Inducible Clindamycin NEGATIVE  Sensitive     * 30,000 COLONIES/mL STAPHYLOCOCCUS SIMULANS  Culture, blood (routine x 2)     Status: None   Collection Time: 05/06/22  1:07 PM   Specimen: BLOOD  Result Value Ref Range Status   Specimen Description   Final    BLOOD LEFT FOREARM Performed at [facility] [address], [place], [place] [address]    Special Requests   Final    BOTTLES DRAWN AEROBIC AND ANAEROBIC Pinch Performed at [facility] [address] [place], [place] [address]    Culture   Final    NO GROWTH 5 DAYS Performed at [facility], [place] [address], [place], [place] [address]    Report Status 05/11/2022 FINAL  Final  Culture, blood (routine x 2)     Status: None   Collection Time: 05/06/22  5:15 PM   Specimen: BLOOD  Result Value Ref Range Status   Specimen Description   Final    BLOOD LEFT ANTECUBITAL Performed at [facility] [address], [place], [place] [address]    Special Requests   Final    BOTTLES DRAWN AEROBIC ONLY Blood Culture adequate volume Performed at [facility] [address], , [place] [address]    Culture   Final    NO GROWTH 5 DAYS Performed at [facility], [place] [address] [place],  [address]    Report Status 05/11/2022 FINAL  Final  MRSA Next Gen by PCR, Nasal     Status: Abnormal   Collection Time: 05/11/22  2:57 PM   Specimen: Nasal Mucosa; Nasal Swab  Result Value Ref Range Status   MRSA by PCR Next Gen DETECTED (A) NOT DETECTED Final    Comment: CRITICAL RESULT CALLED TO, READ BACK BY AND VERIFIED WITH: BREN,S AT 2034 ON 05/11/22 BY VAZQUEZ,J (NOTE) The GeneXpert MRSA Assay (FDA approved for NASAL specimens only), is  one component of a comprehensive MRSA colonization surveillance program. It is not intended to diagnose MRSA infection nor to guide or monitor treatment for MRSA infections. Test performance is not FDA approved in patients less than 103  years old. Performed at [facility], [place] [address] [place], [place] [address]          Radiology Studies: DG Abd Portable 1V  Result Date: 05/11/2022 CLINICAL DATA:  NG tube placement EXAM: PORTABLE ABDOMEN - 1 VIEW COMPARISON:  Chest 05/11/2022 FINDINGS: No enteric tube is demonstrated within the field of view. Visualized bowel gas pattern is normal with scattered gas in the colon. No small or large bowel distention. Surgical clips consistent with mesh hernia repair. IMPRESSION: No enteric tube demonstrated within the field of view. Electronically Signed   By: Lucienne Capers M.D.   On: 05/11/2022 17:56   DG CHEST PORT 1 VIEW  Result Date: 05/11/2022 CLINICAL DATA:  Encounter for hypoxemia EXAM: PORTABLE CHEST 1 VIEW COMPARISON:  Chest radiograph dated May 06, 2022 FINDINGS: The heart is enlarged. Bilateral lower lobe hazy opacities concerning for pulmonary edema or infiltrate. Small to moderate bilateral pleural effusions. Thoracic spondylosis. No acute osseous abnormality. IMPRESSION: 1. Stable cardiomegaly. 2. Bilateral perihilar hazy opacities with pleural effusions suggesting pulmonary edema, suggesting worsening of CHF or development of airspace disease, clinical correlation is suggested. Electronically Signed   By: Keane Police D.O.   On: 05/11/2022 14:24        Scheduled Meds:  apixaban  5 mg Oral BID   atorvastatin  20 mg Oral Daily   calcium carbonate  1 tablet Oral TID WC   Chlorhexidine Gluconate Cloth  6 each Topical Daily   cholestyramine light  4 g Oral QID   feeding supplement  237 mL Oral BID BM   FLUoxetine  40 mg Oral Daily   insulin aspart  0-15 Units Subcutaneous Q4H   methimazole  10 mg Oral TID   multivitamin with minerals  1 tablet Oral Daily   mupirocin ointment  1 Application Nasal BID   Continuous Infusions:   LOS: 6 days    Time spent: 35 minutes    Jerianne Anselmo A Carlee Vonderhaar, MD [facility]   If 7PM-7AM, please  contact night-coverage [email or web address]  05/12/2022, 7:10 AM

## 2022-05-12 NOTE — Procedures (Signed)
Patient Name: [name]  MRN: [id number]  Epilepsy Attending: Lora Havens  Referring Physician/Provider: Donita Brooks, NP  Date: 05/12/2022  Duration: 26.50 mins  Patient history: 76yo F with ams. EEG to evaluate for seizure.  Level of alertness: Awake  AEDs during EEG study: None  Technical aspects: This EEG study was done with scalp electrodes positioned according to the 10-20 International system of electrode placement. Electrical activity was reviewed with band pass filter of 1-'70Hz'$ , sensitivity of 7 uV/mm, display speed of 7m/sec with a '60Hz'$  notched filter applied as appropriate. EEG data were recorded continuously and digitally stored.  Video monitoring was available and reviewed as appropriate.  Description: The posterior dominant rhythm consists of 8 Hz activity of moderate voltage (25-35 uV) seen predominantly in posterior head regions, symmetric and reactive to eye opening and eye closing. EEG showed intermittent generalized 3 to 6 Hz theta-delta slowing. Hyperventilation and photic stimulation were not performed.     ABNORMALITY - Intermittent slow, generalized  IMPRESSION: This study is suggestive of mild diffuse encephalopathy, nonspecific etiology. No seizures or epileptiform discharges were seen throughout the recording.  Lorey Pallett OBarbra Sarks

## 2022-05-12 NOTE — Evaluation (Signed)
Physical Therapy Evaluation Patient Details Name: [name] MRN: [id number] DOB: May 30, 1946 Today's Date: 05/12/2022  History of Present Illness  76 y.o. male with medical history significant of PAF on Eliquis, HLD, DM2, chronic HFrEF, dementia. Presenting with altered mental status. Dx of hyperthyroid, metabolic encephalopathy.  Clinical Impression  [name] is a 76 year old man who presents with confusion and oddly affable affect that presents as amused and laughing with all questions and instructions. Patient is not a reliable historian nor does he answer questions directly instead joking or saying something odd - patient's PLOF is unknown other than that he lives with his wife. Evaluation limited by patient's cognition and refusal to attempt any mobility. He appears to have functional upper body strength though he was unable to consistently follow instructions for MMT. He exhibits some lower extremity difficulty especially with right knee. He refused further movement. He could not be persuaded and he lacks any insight to understand the need to mobilize. Patient will benefit from skilled acute PT services while in hospital to maximize IND and safety in order to return to PLOF.  Patient would benefit from SNF level rehab discharge to maximize participation in functional tasks prior to return home in order to reduce caregiver burden.      Recommendations for follow up therapy are one component of a multi-disciplinary discharge planning process, led by the attending physician.  Recommendations may be updated based on patient status, additional functional criteria and insurance authorization.  Follow Up Recommendations Skilled nursing-short term rehab (<3 hours/day) Can patient physically be transported by private vehicle: No    Assistance Recommended at Discharge Frequent or constant Supervision/Assistance  Patient can return home with the following  Two people to help with  walking and/or transfers;Two people to help with bathing/dressing/bathroom;Assistance with cooking/housework;Assist for transportation;Help with stairs or ramp for entrance    Equipment Recommendations None recommended by PT  Recommendations for Other Services       Functional Status Assessment Patient has had a recent decline in their functional status and demonstrates the ability to make significant improvements in function in a reasonable and predictable amount of time.     Precautions / Restrictions Precautions Precautions: Fall Restrictions Weight Bearing Restrictions: No      Mobility  Bed Mobility               General bed mobility comments: Pt not following cues to progress to OOB activity - states "no" and laughs    Transfers                        Ambulation/Gait                  Stairs            Wheelchair Mobility    Modified Rankin (Stroke Patients Only)       Balance                                             Pertinent Vitals/Pain Pain Assessment Pain Assessment: No/denies pain    Home Living Family/patient expects to be discharged to:: Skilled nursing facility Living Arrangements: Spouse/significant other                 Additional Comments: Patient unable to give reliable information due to confusion.  Prior Function Prior Level of Function : Patient poor historian/Family not available                     Hand Dominance        Extremity/Trunk Assessment   Upper Extremity Assessment Upper Extremity Assessment: Difficult to assess due to impaired cognition    Lower Extremity Assessment Lower Extremity Assessment: Difficult to assess due to impaired cognition       Communication   Communication: No difficulties  Cognition Arousal/Alertness: Awake/alert Behavior During Therapy: WFL for tasks assessed/performed Overall Cognitive Status: No family/caregiver present to  determine baseline cognitive functioning                                 General Comments: Alert to self. Reports seeing his Mother that wasn't there. He laughs and is amused by all questioning and commands but doesn't follow all commands. lacks insight.        General Comments      Exercises     Assessment/Plan    PT Assessment Patient needs continued PT services  PT Problem List Decreased activity tolerance;Decreased balance;Decreased mobility;Decreased cognition;Decreased knowledge of use of DME;Decreased safety awareness;Obesity       PT Treatment Interventions DME instruction;Gait training;Functional mobility training;Therapeutic activities;Therapeutic exercise;Balance training;Patient/family education;Cognitive remediation    PT Goals (Current goals can be found in the Care Plan section)  Acute Rehab PT Goals Patient Stated Goal: No goals expressed PT Goal Formulation: Patient unable to participate in goal setting Time For Goal Achievement: 05/26/22 Potential to Achieve Goals: Fair    Frequency Min 2X/week     Co-evaluation PT/OT/SLP Co-Evaluation/Treatment: Yes Reason for Co-Treatment: For patient/therapist safety;To address functional/ADL transfers PT goals addressed during session: Mobility/safety with mobility OT goals addressed during session: ADL's and self-care       AM-PAC PT "6 Clicks" Mobility  Outcome Measure Help needed turning from your back to your side while in a flat bed without using bedrails?: Total Help needed moving from lying on your back to sitting on the side of a flat bed without using bedrails?: Total Help needed moving to and from a bed to a chair (including a wheelchair)?: Total Help needed standing up from a chair using your arms (e.g., wheelchair or bedside chair)?: Total Help needed to walk in hospital room?: Total Help needed climbing 3-5 steps with a railing? : Total 6 Click Score: 6    End of Session   Activity  Tolerance: Other (comment) (ltd by mentation) Patient left: in bed;with call bell/phone within reach;with nursing/sitter in room Nurse Communication: Mobility status PT Visit Diagnosis: Difficulty in walking, not elsewhere classified (R26.2)    Time: 8891-6945 PT Time Calculation (min) (ACUTE ONLY): 13 min   Charges:   PT Evaluation $PT Eval Low Complexity: 1 Low          [place] Pager [phone] Office [phone]   Cloee Dunwoody 05/12/2022, 2:55 PM

## 2022-05-12 NOTE — Evaluation (Signed)
Occupational Therapy Evaluation Patient Details Name: [name] MRN: [id number] DOB: 1946-05-19 Today's Date: 05/12/2022   History of Present Illness 76 y.o. male with medical history significant of PAF on Eliquis, HLD, DM2, chronic HFrEF, dementia. Presenting with altered mental status. Dx of hyperthyroid, metabolic encephalopathy.   Clinical Impression   [name] is a 76 year old man who presents with confusion and oddly affable affect that presents as amused and laughing with all questions and instructions. Patient is not a reliable historian nor does he answer questions directly instead joking or saying something odd - patient's PLOF is unknown other than that he lives with his wife. Evaluation limited by patient's cognition and refusal to attempt any mobility. His upper body appears to have functional strength though he was unable to consistently resist therapist and laughing as he dropped his arms. He was able to hold a cup and drink from a straw. He exhibits some lower extremity difficulty especially with right knee. He refused further movement. He could not be persuaded and he lacks any insight to understand the need to mobilize. Patient will benefit from skilled OT services while in hospital to improve deficits and learn compensatory strategies as needed in order to return to PLOF.  Patient will need short term rehab at discharge to maximize participation in functional tasks prior to return home in order to reduce caregiver burden.      Recommendations for follow up therapy are one component of a multi-disciplinary discharge planning process, led by the attending physician.  Recommendations may be updated based on patient status, additional functional criteria and insurance authorization.   Follow Up Recommendations  Skilled nursing-short term rehab (<3 hours/day)    Assistance Recommended at Discharge Frequent or constant Supervision/Assistance  Patient can return home  with the following A lot of help with walking and/or transfers;A lot of help with bathing/dressing/bathroom;Assistance with cooking/housework;Direct supervision/assist for financial management;Direct supervision/assist for medications management    Functional Status Assessment  Patient has had a recent decline in their functional status and demonstrates the ability to make significant improvements in function in a reasonable and predictable amount of time.  Equipment Recommendations  Other (comment) (TBD)    Recommendations for Other Services       Precautions / Restrictions Restrictions Weight Bearing Restrictions: No      Mobility Bed Mobility                    Transfers                          Balance                                           ADL either performed or assessed with clinical judgement   ADL Overall ADL's : Needs assistance/impaired Eating/Feeding: Set up;Bed level   Grooming: Set up;Bed level   Upper Body Bathing: Moderate assistance;Bed level   Lower Body Bathing: Total assistance;Bed level   Upper Body Dressing : Maximal assistance;Bed level   Lower Body Dressing: Total assistance;Bed level     Toilet Transfer Details (indicate cue type and reason): unable Toileting- Clothing Manipulation and Hygiene: Total assistance               Vision   Vision Assessment?: No apparent visual deficits     Perception  Praxis      Pertinent Vitals/Pain Pain Assessment Pain Assessment: No/denies pain     Hand Dominance     Extremity/Trunk Assessment Upper Extremity Assessment Upper Extremity Assessment: Difficult to assess due to impaired cognition (WFL ROM, grossly 3+/5 strength)   Lower Extremity Assessment Lower Extremity Assessment: Defer to PT evaluation       Communication Communication Communication: No difficulties   Cognition Arousal/Alertness: Awake/alert Behavior During Therapy: WFL  for tasks assessed/performed (innapropriately amused - laughing to almost each question) Overall Cognitive Status: No family/caregiver present to determine baseline cognitive functioning                                 General Comments: Alert to self. Reports seeing his Mother that wasn't there. He laughs and is amused by all questioning and commands but doesn't follow all commands. lacks insight.     General Comments       Exercises     Shoulder Instructions      Home Living Family/patient expects to be discharged to:: Skilled nursing facility Living Arrangements: Spouse/significant other                               Additional Comments: Patient unable to give reliable information due to confusion.      Prior Functioning/Environment                          OT Problem List: Decreased activity tolerance;Decreased safety awareness;Decreased cognition;Cardiopulmonary status limiting activity;Decreased knowledge of use of DME or AE      OT Treatment/Interventions: Self-care/ADL training;Therapeutic exercise;DME and/or AE instruction;Cognitive remediation/compensation;Therapeutic activities;Balance training;Patient/family education    OT Goals(Current goals can be found in the care plan section) Acute Rehab OT Goals OT Goal Formulation: Patient unable to participate in goal setting Time For Goal Achievement: 05/26/22 Potential to Achieve Goals: Fair  OT Frequency: Min 2X/week    Co-evaluation PT/OT/SLP Co-Evaluation/Treatment: Yes (co-eval)            AM-PAC OT "6 Clicks" Daily Activity     Outcome Measure Help from another person eating meals?: A Little Help from another person taking care of personal grooming?: A Little Help from another person toileting, which includes using toliet, bedpan, or urinal?: Total Help from another person bathing (including washing, rinsing, drying)?: A Lot Help from another person to put on and taking  off regular upper body clothing?: A Lot Help from another person to put on and taking off regular lower body clothing?: Total 6 Click Score: 12   End of Session Nurse Communication: Mobility status  Activity Tolerance: Patient tolerated treatment well Patient left: in bed;with call bell/phone within reach;with nursing/sitter in room  OT Visit Diagnosis: Other symptoms and signs involving cognitive function                Time: 9449-6759 OT Time Calculation (min): 12 min Charges:  OT General Charges $OT Visit: 1 Visit OT Evaluation $OT Eval Low Complexity: 1 Low  Kenneith Stief, OTR/L [place]  Office [phone] Pager: [place] 05/12/2022, 12:51 PM

## 2022-05-12 NOTE — Progress Notes (Addendum)
NAME:  [name], MRN:  [id number], DOB:  04/11/1946, LOS: 6 ADMISSION DATE:  05/06/2022, CONSULTATION DATE:  05/11/22 REFERRING MD:  Dr. Tyrell Antonio, CHIEF COMPLAINT:  AMS   History of Present Illness:  76 y/o M who presented to [facility] on 7/21 with reports of altered mental status.    The patient is followed by Dr. [name] for primary care.  He was recently seen on 7/19 with concerns for worsening dementia and was referred for Neurology evaluation.  He was started on seroquel 50 mg QHS at that time with plans for return visit in 2 weeks. He was seen by his Cardiologist in January 2023 and noted to have lost a significant amount of weight > in July 2020 he was ~ 400lbs and in January 2023 he weighed 270lbs.    He presented 7/21 to the ER with reports of hallucinations & weakness. Daughter and wife reported a dramatic change on day of admission.  He was seeing things in the house and was awake all night.  He was confused and picking at things.  The family noted decreased appetite.  He had no acute complaints though. Initial ER work up notable for acutely negative CT of the head but showed moderate cerebral atrophy.  CXR showed borderline cardiomegaly, small bilateral pleural effusions L>R but no infiltrate. UDS negative. UA with concern for possible UTI and rocephin was initiated. He was admitted per TRH. Initial labs notable for baseline renal function, elevated troponin/BNP, d-dimer and low TSH.  LE venous duplex assessed and negative.  TSH <0.010, Free T3 >5, Free T4 4.01.  The patient was started on methimazole and amiodarone was discontinued. Repeat ECHO showed reduced EF to 25-30% with global hypokinesis.  Cardiology was consulted.  MRI brain was ordered but was not completed. However, he has received 5.5 mg lorazepam since admission (does not take benzodiazepines at home). His mental status worsened and an ABG was obtained which showed ph 7.33 / CO2 49 / PO2 66 / Bicarb 25.8.  He was transferred to SDU  for observation.    PCCM consulted for evaluation.   Pertinent  Medical History  NICM - normal coronaries as of 2014 LHC Chronic Systolic CHF - EF 10-17%, diffuse hypokinesis AF / PAF -hx of cardioversion  HTN HLD  Morbid Obesity  OSA  Former Smoker - quit 2000, 104 pack year hx Non-Hodgkin's Lymphoma  Diverticulitis  Dementia - moderate cerebral atrophy on CT head, not on medications at baseline  Significant Hospital Events: Including procedures, antibiotic start and stop dates in addition to other pertinent events   7/21 Admit with AMS in setting of UTI 7/26 PCCM consulted for altered mental status   Interim History / Subjective:  Afebrile Received albumin overnight  Issues with concern for bladder emptying  I/O 1L UOP, -1L in last 24 hours Pt denies pain  Objective   Blood pressure (!) 127/54, pulse (!) 48, temperature (!) 97.5 F (36.4 C), temperature source Oral, resp. rate 20, height '6\' 2"'$  (1.88 m), weight 118.4 kg, SpO2 97 %.        Intake/Output Summary (Last 24 hours) at 05/12/2022 0953 Last data filed at 05/12/2022 0346 Gross per 24 hour  Intake --  Output 1050 ml  Net -1050 ml   Filed Weights   05/10/22 0438 05/11/22 1500 05/12/22 0346  Weight: 120.3 kg 119.2 kg 118.4 kg    Examination: General: tall elderly male lying in bed in NAD HEENT: MM pink/moist, anicteric, [place] O2 Neuro:  Awakens to voice, mental status significantly improved, speech clear / pleasant CV: s1s2 RRR, no m/r/g PULM: non-labored at rest, lungs bilaterally with good air entry anterior, diminished bases  GI: soft, bsx4 active  Extremities: warm/dry, trace edema  Skin: no rashes.  LE's with changes consistent with chronic venous insufficiency   Resolved Hospital Problem list     Assessment & Plan:   Thyrotoxicosis  Concern for Impending Thyroid Storm Hyperthyroidism Patient afebrile, remains SB-SR but was previously on beta blocker + amiodarone and had AKI on CKD. His Thyroid  Storm score puts him at "possible impending" but this is based on his heart failure findings which are chronic and mental status (which was likely related to ativan given quick resolution.  His bilirubin is negative.  He has had significant weight loss since January 2020 based on note review from Cardiology.  His prior TSH have been WNL as recent as 06/2021.   He was on amiodarone and this has been stopped.  There is some concern that he may have some element of adult failure to thrive given weight loss.   -continue cholestryamine, methimazole per TRH  -follow intermittent T3, T4, TSH  -hold further beta blockers  -would hold seroquel, consider not restarting  -will need endocrine follow up at discharge   Acute Metabolic Encephalopathy superimposed on Dementia  Patient has received 5.5 mg ativan since admission. He does not take benzodiazepines at baseline.  Baseline sr cr appears to be ~ 1.4.  His CT head was negative for acute process but does show moderate atrophy.   -no further benzodiazepines  -continue home prozac  -frequent reorientation, promote sleep wake cycle   Acute Hypoxic / Hypercarbic Respiratory Failure  OSA Heavy Tobacco Abuse  Suspect in setting of shunt physiology due to atelectasis with benzos / altered mental status, baseline OSA. Did not tolerate CPAP / does not wear at home. Current smoker.  -O2 to support sats 88-95%  -push pulmonary hygiene as able - upright positioning  Chronic Combined Systolic / Diastolic CHF  PAF  HTN, HLD  Currently in SR/SB.  -per Cardiology -no further amiodarone  -diuresis per Cardiology / TRH > required albumin for soft BP overnight   Possible UTI  UA with positive nitrites, 11-20 WBC.  Urine culture with staph simulans.  Doubt this is active organism.  -s/p 3 days abx (ceftriaxone > ampicillin) -monitor -? BPH with retention in SDU and mixed urine picture   Significant Weight Loss  Concern for Dementia Related Adult Failure to  Thrive  Prior weight in 2020 ~ 400lbs, now down to 262lbs. Per chart review, he has lost weight even since he was last seen by Dr. [name] in Jan 2023.  -diet as tolerated   Best Practice (right click and "Reselect all SmartList Selections" daily)  Per Primary / TRH   PCCM will be available PRN.   Critical care time: n/a     Noe Gens, MSN, APRN, NP-C, AGACNP-BC [facility] 05/12/2022, 9:53 AM   Please see [email or web address] for pager details.   From 7A-7P if no response, please call [phone] After hours, please call ELink [phone]

## 2022-05-13 DIAGNOSIS — G9341 Metabolic encephalopathy: Secondary | ICD-10-CM | POA: Diagnosis not present

## 2022-05-13 DIAGNOSIS — I5022 Chronic systolic (congestive) heart failure: Secondary | ICD-10-CM | POA: Diagnosis not present

## 2022-05-13 DIAGNOSIS — E0590 Thyrotoxicosis, unspecified without thyrotoxic crisis or storm: Secondary | ICD-10-CM | POA: Diagnosis not present

## 2022-05-13 LAB — GLUCOSE, CAPILLARY
Glucose-Capillary: 104 mg/dL — ABNORMAL HIGH (ref 70–99)
Glucose-Capillary: 105 mg/dL — ABNORMAL HIGH (ref 70–99)
Glucose-Capillary: 112 mg/dL — ABNORMAL HIGH (ref 70–99)
Glucose-Capillary: 118 mg/dL — ABNORMAL HIGH (ref 70–99)
Glucose-Capillary: 120 mg/dL — ABNORMAL HIGH (ref 70–99)
Glucose-Capillary: 93 mg/dL (ref 70–99)
Glucose-Capillary: 98 mg/dL (ref 70–99)

## 2022-05-13 LAB — COMPREHENSIVE METABOLIC PANEL
ALT: 15 U/L (ref 0–44)
AST: 17 U/L (ref 15–41)
Albumin: 3.1 g/dL — ABNORMAL LOW (ref 3.5–5.0)
Alkaline Phosphatase: 61 U/L (ref 38–126)
Anion gap: 10 (ref 5–15)
BUN: 31 mg/dL — ABNORMAL HIGH (ref 8–23)
CO2: 29 mmol/L (ref 22–32)
Calcium: 8.8 mg/dL — ABNORMAL LOW (ref 8.9–10.3)
Chloride: 109 mmol/L (ref 98–111)
Creatinine, Ser: 1.32 mg/dL — ABNORMAL HIGH (ref 0.61–1.24)
GFR, Estimated: 56 mL/min — ABNORMAL LOW (ref >60)
Glucose, Bld: 109 mg/dL — ABNORMAL HIGH (ref 70–99)
Potassium: 3.6 mmol/L (ref 3.5–5.1)
Sodium: 148 mmol/L — ABNORMAL HIGH (ref 135–145)
Total Bilirubin: 0.9 mg/dL (ref 0.3–1.2)
Total Protein: 5.9 g/dL — ABNORMAL LOW (ref 6.5–8.1)

## 2022-05-13 LAB — CBC
HCT: 44.9 % (ref 39.0–52.0)
Hemoglobin: 14.1 g/dL (ref 13.0–17.0)
MCH: 28.8 pg (ref 26.0–34.0)
MCHC: 31.4 g/dL (ref 30.0–36.0)
MCV: 91.6 fL (ref 80.0–100.0)
Platelets: 220 10*3/uL (ref 150–400)
RBC: 4.9 MIL/uL (ref 4.22–5.81)
RDW: 15.6 % — ABNORMAL HIGH (ref 11.5–15.5)
WBC: 11.1 10*3/uL — ABNORMAL HIGH (ref 4.0–10.5)
nRBC: 0 % (ref 0.0–0.2)

## 2022-05-13 LAB — PROCALCITONIN: Procalcitonin: <0.1 ng/mL

## 2022-05-13 MED ORDER — ENSURE ENLIVE PO LIQD
237.0000 mL | Freq: Three times a day (TID) | ORAL | Status: DC
  Administered 2022-05-13 – 2022-05-23 (×25): 237 mL via ORAL

## 2022-05-13 MED ORDER — BOOST / RESOURCE BREEZE PO LIQD CUSTOM
1.0000 | ORAL | Status: DC
  Administered 2022-05-14 – 2022-05-22 (×5): 1 via ORAL

## 2022-05-13 NOTE — Progress Notes (Signed)
Progress Note  Patient Name: RAVIN DENARDO Date of Encounter: 05/13/2022  [facility] Cardiologist: Buford Dresser, MD   Subjective   Feeling well.  Denies CP/SOB.   Inpatient Medications    Scheduled Meds:  apixaban  5 mg Oral BID   atorvastatin  20 mg Oral Daily   calcium carbonate  1 tablet Oral TID WC   Chlorhexidine Gluconate Cloth  6 each Topical Daily   cholestyramine light  4 g Oral QID   feeding supplement  237 mL Oral BID BM   FLUoxetine  40 mg Oral Daily   insulin aspart  0-15 Units Subcutaneous Q4H   methimazole  10 mg Oral TID   multivitamin with minerals  1 tablet Oral Daily   mupirocin ointment  1 Application Nasal BID   Continuous Infusions:  PRN Meds: acetaminophen **OR** acetaminophen, mouth rinse, mouth rinse   Vital Signs    Vitals:   05/13/22 0400 05/13/22 0439 05/13/22 0500 05/13/22 0755  BP: 126/81     Pulse: 68  73   Resp: (!) 26  (!) 25   Temp: 97.8 F (36.6 C)   98.3 F (36.8 C)  TempSrc: Axillary   Oral  SpO2: 96%  98%   Weight:  118.5 kg    Height:        Intake/Output Summary (Last 24 hours) at 05/13/2022 0814 Last data filed at 05/13/2022 0543 Gross per 24 hour  Intake 440 ml  Output 1000 ml  Net -560 ml      05/13/2022    4:39 AM 05/12/2022    3:46 AM 05/11/2022    3:00 PM  Last 3 Weights  Weight (lbs) 261 lb 3.9 oz 261 lb 0.4 oz 262 lb 12.6 oz  Weight (kg) 118.5 kg 118.4 kg 119.2 kg      Telemetry    Sinus rhythm.  Up to 7 beats NSVT.  PVCs - Personally Reviewed  ECG    N/a - Personally Reviewed  Physical Exam   VS:  BP 126/81 (BP Location: Right Arm)   Pulse 73   Temp 98.3 F (36.8 C) (Oral)   Resp (!) 25   Ht '6\' 2"'$  (1.88 m)   Wt 118.5 kg   SpO2 98%   BMI 33.54 kg/m  , BMI Body mass index is 33.54 kg/m. GENERAL:  Well appearing HEENT: Pupils equal round and reactive, fundi not visualized, oral mucosa unremarkable NECK:  No jugular venous distention, waveform within normal limits,  carotid upstroke brisk and symmetric, no bruits, no thyromegaly LUNGS:  Clear to auscultation bilaterally HEART:  RRR.  PMI not displaced or sustained,S1 and S2 within normal limits, no S3, no S4, no clicks, no rubs, no murmurs ABD:  Flat, positive bowel sounds normal in frequency in pitch, no bruits, no rebound, no guarding, no midline pulsatile mass, no hepatomegaly, no splenomegaly EXT:  2 plus pulses throughout, no edema, no cyanosis no clubbing SKIN:  No rashes no nodules NEURO:  Cranial nerves II through XII grossly intact, motor grossly intact throughout PSYCH:  Pleasantly confused.  Labs    High Sensitivity Troponin:   Recent Labs  Lab 05/06/22 1012 05/06/22 1140  TROPONINIHS 83* 86*     Chemistry Recent Labs  Lab 05/07/22 0701 05/08/22 0829 05/11/22 0834 05/11/22 1429 05/12/22 0302 05/13/22 0304  NA 143   < > 142  --  146* 148*  K 3.7   < > 4.0  --  3.8 3.6  CL 106   < >  108  --  107 109  CO2 26   < > 22  --  27 29  GLUCOSE 94   < > 120*  --  86 109*  BUN 28*   < > 32*  --  33* 31*  CREATININE 1.51*   < > 1.49*  --  1.63* 1.32*  CALCIUM 9.0   < > 8.7*  --  8.4* 8.8*  MG 1.9  --   --  2.1 2.1  --   PROT 6.2*  --   --   --  5.7* 5.9*  ALBUMIN 3.1*  --   --   --  2.9* 3.1*  AST 15  --   --   --  17 17  ALT 14  --   --   --  16 15  ALKPHOS 66  --   --   --  56 61  BILITOT 0.9  --   --   --  0.7 0.9  GFRNONAA 48*   < > 49*  --  43* 56*  ANIONGAP 11   < > 12  --  12 10   < > = values in this interval not displayed.    Lipids No results for input(s): "CHOL", "TRIG", "HDL", "LABVLDL", "LDLCALC", "CHOLHDL" in the last 168 hours.  Hematology Recent Labs  Lab 05/11/22 0834 05/12/22 0302 05/13/22 0304  WBC 10.0 10.9* 11.1*  RBC 5.33 5.07 4.90  HGB 15.3 14.7 14.1  HCT 48.7 47.8 44.9  MCV 91.4 94.3 91.6  MCH 28.7 29.0 28.8  MCHC 31.4 30.8 31.4  RDW 15.7* 15.9* 15.6*  PLT 298 217 220   Thyroid  Recent Labs  Lab 05/06/22 1012 05/06/22 1715 05/11/22 0929   TSH <0.010*  --   --   FREET4  --    < > 3.16*   < > = values in this interval not displayed.    BNP Recent Labs  Lab 05/06/22 1140 05/12/22 1253  BNP 2,258.1* 1,314.2*    DDimer  Recent Labs  Lab 05/06/22 1140  DDIMER 1.17*     Radiology    EEG adult  Result Date: 05/12/2022 Lora Havens, MD     05/12/2022  3:55 PM Patient Name: MAAHIR HORST MRN: [id number] Epilepsy Attending: Lora Havens Referring Physician/Provider: Donita Brooks, NP Date: 05/12/2022 Duration: 26.50 mins Patient history: 76yo F with ams. EEG to evaluate for seizure. Level of alertness: Awake AEDs during EEG study: None Technical aspects: This EEG study was done with scalp electrodes positioned according to the 10-20 International system of electrode placement. Electrical activity was reviewed with band pass filter of 1-'70Hz'$ , sensitivity of 7 uV/mm, display speed of 51m/sec with a '60Hz'$  notched filter applied as appropriate. EEG data were recorded continuously and digitally stored.  Video monitoring was available and reviewed as appropriate. Description: The posterior dominant rhythm consists of 8 Hz activity of moderate voltage (25-35 uV) seen predominantly in posterior head regions, symmetric and reactive to eye opening and eye closing. EEG showed intermittent generalized 3 to 6 Hz theta-delta slowing. Hyperventilation and photic stimulation were not performed.   ABNORMALITY - Intermittent slow, generalized IMPRESSION: This study is suggestive of mild diffuse encephalopathy, nonspecific etiology. No seizures or epileptiform discharges were seen throughout the recording. PLora Havens   DG Abd Portable 1V  Result Date: 05/11/2022 CLINICAL DATA:  NG tube placement EXAM: PORTABLE ABDOMEN - 1 VIEW COMPARISON:  Chest 05/11/2022 FINDINGS: No enteric tube is demonstrated  within the field of view. Visualized bowel gas pattern is normal with scattered gas in the colon. No small or large bowel distention.  Surgical clips consistent with mesh hernia repair. IMPRESSION: No enteric tube demonstrated within the field of view. Electronically Signed   By: Lucienne Capers M.D.   On: 05/11/2022 17:56   DG CHEST PORT 1 VIEW  Result Date: 05/11/2022 CLINICAL DATA:  Encounter for hypoxemia EXAM: PORTABLE CHEST 1 VIEW COMPARISON:  Chest radiograph dated May 06, 2022 FINDINGS: The heart is enlarged. Bilateral lower lobe hazy opacities concerning for pulmonary edema or infiltrate. Small to moderate bilateral pleural effusions. Thoracic spondylosis. No acute osseous abnormality. IMPRESSION: 1. Stable cardiomegaly. 2. Bilateral perihilar hazy opacities with pleural effusions suggesting pulmonary edema, suggesting worsening of CHF or development of airspace disease, clinical correlation is suggested. Electronically Signed   By: Keane Police D.O.   On: 05/11/2022 14:24    Cardiac Studies   ECHO: 05/07/2022 IMPRESSIONS   1. Left ventricular ejection fraction, by estimation, is 25 to 30%. The left ventricle has severely decreased function. The left ventricle  demonstrates global hypokinesis. There is mild left ventricular  hypertrophy. Left ventricular diastolic parameters  are consistent with Grade III diastolic dysfunction (restrictive).   2. Right ventricular systolic function is mildly reduced. The right  ventricular size is mildly enlarged.   3. Left atrial size was severely dilated.   4. Right atrial size was mild to moderately dilated.   5. The mitral valve is normal in structure. Mild to moderate mitral valve regurgitation. No evidence of mitral stenosis.   6. The aortic valve is tricuspid. There is mild calcification of the  aortic valve. There is mild thickening of the aortic valve. Aortic valve regurgitation is mild. No aortic stenosis is present.   7. Aortic dilatation noted. There is borderline dilatation of the aortic  root, measuring 41 mm.   Patient Profile     Mr. Wesely is a 66M with  chronic systolic diastolic heart failure (LVEF 35 to 40%) non-ischemic cardiomyopathy, PAF, diabetes, hypertension, hyperlipidemia, non-Hodgkin's lymphoma, dementia, and OSA admitted with encephalopathy and UTI  Assessment & Plan    # Chronic systolic and diastolic heart failure: LVEF 45-50% at baseline, 25-30% this admission.  His volume status appears stable, though BNP was 1314 yesterday.  Renal function worsened with diuresis and serum sodium continues to increase, indicating intravscular volume depletion.  Would continue to hold on diuresis.  Renal function is improving today.   # Hypertension:  BP stable.  Holding metoprolol and spironolactone.   # PAF: Maintaining sinus rhythm on amiodarone.  Continue Eliquis.   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  BP control as above.  Resume beta blocker prior to discharge if HR allows.   # Encephalopathy: Treated for UTI.  No seizures per neurology.  Holding benzodiazepines.  Encourage CPAP use.  # Hyperthyroidism:  Per primary team.     For questions or updates, please contact [facility] Please consult [email or web address] for contact info under        Signed, Skeet Latch, MD  05/13/2022, 8:14 AM

## 2022-05-13 NOTE — Progress Notes (Signed)
Nutrition Follow-up  DOCUMENTATION CODES:   Obesity unspecified  INTERVENTION:  - will increase Ensure to TID and add Boost Breeze once/day, each supplement provides 250 kcal and 9 grams of protein.   NUTRITION DIAGNOSIS:   Increased nutrient needs related to acute illness as evidenced by estimated needs. -ongoing  GOAL:   Patient will meet greater than or equal to 90% of their needs -variably met  MONITOR:   PO intake, Supplement acceptance, Labs, Weight trends  ASSESSMENT:   Pt admitted with hallucinations r/t dementia and UTI. PMH significant for PAF, HLD, T2DM, chronic CHF, and dementia.  Patient sitting up in bed with no visitors present at the time of RD visit. Patient noted to be confused. When RD asks, he is unsure of where we are or what is occurring.   Flow sheet documentation indicates he ate 0% of breakfast, 50% of lunch, and 80% of dinner on 7/22; 85% of breakfast and 25% of lunch on 7/23; 30% of breakfast on 7/24.  No meal intakes documented since 7/24. Able to talk with RN who shares that it was told to her in report this AM that he ate chicken noodle soup at ~0600 today.   He has been accepting Ensure ~90% of the time offered.   Weight has been stable throughout hospitalization.    Labs reviewed; CBGs: 104, 112, 98 mg/dl, Na: 148 mmol/l, BUN: 31 mg/dl, creatinine: 1.32 mg/dl, GFR: 56 ml/min.  Medications reviewed; sliding scale novolog, 1 tablet multivitamin with minerals/day.     NUTRITION - FOCUSED PHYSICAL EXAM:  Flowsheet Row Most Recent Value  Orbital Region No depletion  Upper Arm Region No depletion  Thoracic and Lumbar Region Unable to assess  Buccal Region No depletion  Temple Region No depletion  Clavicle Bone Region No depletion  Clavicle and Acromion Bone Region No depletion  Scapular Bone Region No depletion  Dorsal Hand No depletion  Patellar Region No depletion  Anterior Thigh Region No depletion  Posterior Calf Region No  depletion  Edema (RD Assessment) Mild  [BLE]  Hair Reviewed  Eyes Reviewed  Mouth Reviewed  Skin Reviewed  Nails Reviewed       Diet Order:   Diet Order             Diet Heart Room service appropriate? No; Fluid consistency: Thin  Diet effective now                   EDUCATION NEEDS:   No education needs have been identified at this time  Skin:  Skin Assessment: Skin Integrity Issues: Skin Integrity Issues:: DTI DTI: R sacrum  Last BM:  7/21 documented by RN yesterday in the flow sheet  Height:   Ht Readings from Last 1 Encounters:  05/11/22 _0  (1.88 m)    Weight:   Wt Readings from Last 1 Encounters:  05/13/22 118.5 kg    BMI:  Body mass index is 33.54 kg/m.  Estimated Nutritional Needs:  Kcal:  2100-2300 Protein:  105-120g Fluid:  >/=2.1L     Jarome Matin, MS, RD, LDN, CNSC Registered Dietitian II Inpatient Clinical Nutrition RD pager # and on-call/weekend pager # available in [facility]

## 2022-05-13 NOTE — Progress Notes (Signed)
PROGRESS NOTE    [name]  GYI:[id number] DOB: [birth year] DOA: 05/06/2022 PCP: [name], MD   Brief Narrative: 76 year old with past medical history significant for PAF on Eliquis, hyperlipidemia, diabetes type 2, chronic heart failure reduced ejection fraction 45%, dementia.  Patient presented with altered mental status, increased confusion over the last several days.  He has been having hallucination.  Patient was a started on Seroquel a couple of days prior to admission.  He has been following with his PCP for worsening dementia.  Evaluation in the ED GL hypothyroidism, with a TSH of 0.01, free T4 at 4.0, free T3 at 5.  He was started on methimazole and amiodarone was discontinued.  Patient had some SVT , 2D echo showed lower ejection fraction to 25 to 30%.  Global hypokinesis.  Cardiology has been consulted.  He has been confused, and agitated.  MRI was ordered 7/25th.  Patient received IV Ativan, he became more lethargic 7/26 after he received ativan  from prior night. ABG showed acute hypoxic, hypercapnic respiratory failure. He was transfer to ICU. CCM consulted. Patient was placed on supplemental oxygen. His mental status has improved, his respiratory failure has improved as well.    Assessment & Plan:   Principal Problem:   Hyperthyroidism Active Problems:   Essential hypertension   HLD (hyperlipidemia)   DM2 (diabetes mellitus, type 2) (HCC)   Chronic HFrEF (heart failure with reduced ejection fraction) (HCC)   PAF (paroxysmal atrial fibrillation) (HCC)   Acute metabolic encephalopathy   Dementia with behavioral disturbance (HCC)   Stage 3a chronic kidney disease (CKD) (HCC)   Elevated troponin   Elevated d-dimer  1-Hyperthyroidism: -Patient presented with a TSH of 0.01, free T3 at 5, free T4 at 4. -Patient was a started on methimazole and propanolol. -Hold on BB  due to bradycardia -Repeated free T3 : 3.6 and free T4 down to 3.1. -Started  patient on  cholestyramine 4 g 4 times a day.    2-Acute metabolic encephalopathy -In the setting of hyperthyroidism -Hopefully with treating hyperthyroidism confusion will improve. -MRI ordered, he got agitated and unable to complete MRI.  He received Ativan -He bev=came more  lethargic  after he received Ativan.  -ABG showed acute hypoxic , hypercapnic resp failure.  -Avoid benzos.  -Repeated Blood culture: No growth to date.  -pro-clacitonin. Negative  3-PAF: -Metoprolol held due to Bradycardia.  -Amiodarone discontinue due to hyperthyroidism.  -Cardiology consulted, follow input.   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  Cardiology consulted. Appreciate assistance.  Hold diuretics due to hypernatremia.   5-Acute Hypoxic Respiratory Failure;  Patient more lethargic 7/26.  Secondary to Ativan, hypoventilation. .  ABG showed PO2: 66, PCO2 49. Chest x ray: pulmonary edema vs infiltrates. Received one dose of IV lasix 7/26.Marland Kitchen  CCM consulted. Recommended Oxygen supplementation, transfer to ICU.  Off oxygen.  Holding lasix due to hypernatremia   Diabetes type 2:  Hypoglycemia; due to poor oral intake. Monitor.  Continue with a sliding scale insulin  Dementia with behavioral disturbance.  Seroquel has been on hold, he started to have hallucination after he was started on Seroquel. Delirium precaution.   Hyperlipidemia: Continue with Lipitor  Hypertension: On spironolactone and Lasix Elevated D-dimer: Doppler negative for DVT Continue with Eliquis, he was already on Eliquis for PAF  Hypokalemia:Replaced.   Generalized weakness:  PT , OT   AKI on CKD stage IIIa;  Cr Peal to 1.6 Has urine retention, foley catheter placed.  Hold diuretics.  Cr trending down.   Hypernatremia; encourage oral intake.   30,000  staph in urine. Blood culture negative   Nutrition Problem: Increased nutrient needs Etiology: acute illness    Signs/Symptoms: estimated needs    Interventions: Ensure Enlive (each supplement provides 350kcal and 20 grams of protein), Liberalize Diet  Estimated body mass index is 33.54 kg/m as calculated from the following:   Height as of this encounter: '6\' 2"'$  (1.88 m).   Weight as of this encounter: 118.5 kg.   DVT prophylaxis: Eliquis Code Status: Full code Family Communication: Wife at bedside. Will consult palliative for goals of care.  Disposition Plan:  Status is: Inpatient Remains inpatient appropriate because: management of hyperthyroidism     Consultants:  Cardiology  Procedures:  ECHO  Antimicrobials:    Subjective: He is alert, conversant, confuse.    Objective: Vitals:   05/13/22 1130 05/13/22 1156 05/13/22 1200 05/13/22 1300  BP: (!) 151/75  126/73 127/88  Pulse: 60  (!) 59 60  Resp: 17  (!) 23 (!) 21  Temp:  98.1 F (36.7 C)    TempSrc:  Oral    SpO2: 96%  98% 98%  Weight:      Height:        Intake/Output Summary (Last 24 hours) at 05/13/2022 1308 Last data filed at 05/13/2022 1117 Gross per 24 hour  Intake 680 ml  Output 675 ml  Net 5 ml    Filed Weights   05/11/22 1500 05/12/22 0346 05/13/22 0439  Weight: 119.2 kg 118.4 kg 118.5 kg    Examination:  General exam: NAD Respiratory system: CTA Cardiovascular system: S 1, S 2 RRR Gastrointestinal system:BS present, soft, nt Central nervous system: alert, follows command, confuse Extremities: no edema, Hyperpigmentation Lower extremities.    Data Reviewed: I have personally reviewed following labs and imaging studies  CBC: Recent Labs  Lab 05/08/22 0829 05/09/22 0529 05/10/22 0843 05/11/22 0834 05/12/22 0302 05/13/22 0304  WBC 7.7 8.5 8.7 10.0 10.9* 11.1*  NEUTROABS 5.9 6.7 6.8  --   --   --   HGB 14.7 14.5 14.6 15.3 14.7 14.1  HCT 46.5 46.0 46.3 48.7 47.8 44.9   MCV 91.2 90.9 91.3 91.4 94.3 91.6  PLT 273 298 253 298 217 284    Basic Metabolic Panel: Recent Labs  Lab 05/07/22 0701 05/08/22 0829 05/09/22 0529 05/10/22 0843 05/11/22 0834 05/11/22 1429 05/12/22 0302 05/13/22 0304  NA 143   < > 141 141 142  --  146* 148*  K 3.7   < > 3.7 3.3* 4.0  --  3.8 3.6  CL 106   < > 108 107 108  --  107 109  CO2 26   < > '23 24 22  '$ --  27 29  GLUCOSE 94   < > 138* 106* 120*  --  86 109*  BUN 28*   < > 27* 29* 32*  --  33* 31*  CREATININE 1.51*   < > 1.52* 1.40* 1.49*  --  1.63* 1.32*  CALCIUM 9.0   < > 9.1 8.6* 8.7*  --  8.4* 8.8*  MG 1.9  --   --   --   --  2.1 2.1  --   PHOS  --   --   --   --   --   --  5.4*  --    < > = values in this interval not displayed.    GFR: Estimated Creatinine Clearance: 65.1 mL/min (A) (by C-G formula based on SCr of 1.32 mg/dL (H)). Liver Function Tests: Recent Labs  Lab 05/07/22 0701 05/12/22 0302 05/13/22 0304  AST '15 17 17  '$ ALT '14 16 15  '$ ALKPHOS 66 56 61  BILITOT 0.9 0.7 0.9  PROT 6.2* 5.7* 5.9*  ALBUMIN 3.1* 2.9* 3.1*    No results for input(s): "LIPASE", "AMYLASE" in the last 168 hours. No results for input(s): "AMMONIA" in the last 168 hours.  Coagulation Profile: No results for input(s): "INR", "PROTIME" in the last 168 hours. Cardiac Enzymes: Recent Labs  Lab 05/12/22 0302  CKTOTAL 18*  CKMB 1.8    BNP (last 3 results) No results for input(s): "PROBNP" in the last 8760 hours. HbA1C: No results for input(s): "HGBA1C" in the last 72 hours. CBG: Recent Labs  Lab 05/12/22 1902 05/12/22 2353 05/13/22 0330 05/13/22 0737 05/13/22 1125  GLUCAP 151* 93 104* 112* 98    Lipid Profile: No results for input(s): "CHOL", "HDL", "LDLCALC", "TRIG", "CHOLHDL", "LDLDIRECT" in the last 72 hours. Thyroid Function Tests: Recent Labs    05/11/22 0929  FREET4 3.16*  T3FREE 3.6    Anemia Panel: Recent Labs    05/11/22 0936  VITAMINB12 1,049*    Sepsis Labs: Recent Labs  Lab  05/06/22 1507 05/06/22 1715 05/11/22 1805 05/12/22 0302 05/13/22 0304  PROCALCITON  --   --   --  <0.10 <0.10  LATICACIDVEN 2.4* 1.8 1.5 1.6  --      Recent Results (from the past 240 hour(s))  Resp Panel by RT-PCR (Flu A&B, Covid) Anterior Nasal Swab     Status: None   Collection Time: 05/06/22 10:12 AM   Specimen: Anterior Nasal Swab  Result Value Ref Range Status   SARS Coronavirus 2 by RT PCR NEGATIVE NEGATIVE Final    Comment: (NOTE) SARS-CoV-2 target nucleic acids are NOT DETECTED.  The SARS-CoV-2 RNA is generally detectable in upper respiratory specimens during the acute phase of infection. The lowest concentration of SARS-CoV-2 viral copies this assay can detect is 138 copies/mL. A negative result does not preclude SARS-Cov-2 infection and should not be used as the sole basis for treatment or other patient management decisions. A negative result may occur with  improper specimen collection/handling, submission of specimen other than nasopharyngeal swab, presence of viral mutation(s) within the areas targeted by this assay, and inadequate number of viral copies(<138 copies/mL). A negative result must be combined with clinical observations, patient history, and epidemiological information. The expected result is Negative.  Fact Sheet for Patients:  EntrepreneurPulse.com.au  Fact Sheet for Healthcare Providers:  IncredibleEmployment.be  This test is no t yet approved or cleared by the [place] FDA and  has been authorized for detection and/or diagnosis of SARS-CoV-2 by FDA under an Emergency Use Authorization (EUA). This EUA will remain  in effect (meaning this test can be used) for the duration of the COVID-19 declaration under Section 564(b)(1) of the Act, 21 U.S.C.section 360bbb-3(b)(1), unless the authorization is terminated  or revoked sooner.       Influenza A by PCR NEGATIVE NEGATIVE Final   Influenza B by PCR  NEGATIVE NEGATIVE Final    Comment: (NOTE) The Xpert Xpress SARS-CoV-2/FLU/RSV plus assay is intended as an aid in the diagnosis of influenza from Nasopharyngeal swab specimens and should not be used as a sole basis for treatment. Nasal washings  and aspirates are unacceptable for Xpert Xpress SARS-CoV-2/FLU/RSV testing.  Fact Sheet for Patients: EntrepreneurPulse.com.au  Fact Sheet for Healthcare Providers: IncredibleEmployment.be  This test is not yet approved or cleared by the [place] FDA and has been authorized for detection and/or diagnosis of SARS-CoV-2 by FDA under an Emergency Use Authorization (EUA). This EUA will remain in effect (meaning this test can be used) for the duration of the COVID-19 declaration under Section 564(b)(1) of the Act, 21 U.S.C. section 360bbb-3(b)(1), unless the authorization is terminated or revoked.  Performed at [facility], [place] [address] [place], [place] [address]   Urine Culture     Status: Abnormal   Collection Time: 05/06/22 11:50 AM   Specimen: Urine, Catheterized  Result Value Ref Range Status   Specimen Description   Final    URINE, CATHETERIZED Performed at [place] [address], [place], [place] [address]    Special Requests   Final    NONE Performed at [facility], [place] [address], [place], [place] [address]    Culture 30,000 COLONIES/mL STAPHYLOCOCCUS SIMULANS (A)  Final   Report Status 05/08/2022 FINAL  Final   Organism ID, Bacteria STAPHYLOCOCCUS SIMULANS (A)  Final      Susceptibility   Staphylococcus simulans - MIC*    CIPROFLOXACIN <=0.5 SENSITIVE Sensitive     GENTAMICIN <=0.5 SENSITIVE Sensitive     NITROFURANTOIN <=16 SENSITIVE Sensitive     OXACILLIN <=0.25 SENSITIVE Sensitive     TETRACYCLINE <=1 SENSITIVE Sensitive     VANCOMYCIN <=0.5 SENSITIVE Sensitive     TRIMETH/SULFA <=10 SENSITIVE Sensitive      CLINDAMYCIN <=0.25 SENSITIVE Sensitive     RIFAMPIN <=0.5 SENSITIVE Sensitive     Inducible Clindamycin NEGATIVE Sensitive     * 30,000 COLONIES/mL STAPHYLOCOCCUS SIMULANS  Culture, blood (routine x 2)     Status: None   Collection Time: 05/06/22  1:07 PM   Specimen: BLOOD  Result Value Ref Range Status   Specimen Description   Final    BLOOD LEFT FOREARM Performed at [facility] [address] [place], [place] [address]    Special Requests   Final    BOTTLES DRAWN AEROBIC AND ANAEROBIC [place] Performed at [place] [address] [place], [place] [address]    Culture   Final    NO GROWTH 5 DAYS Performed at [facility], [place] [address] [place], [place] [address]    Report Status 05/11/2022 FINAL  Final  Culture, blood (routine x 2)     Status: None   Collection Time: 05/06/22  5:15 PM   Specimen: BLOOD  Result Value Ref Range Status   Specimen Description   Final    BLOOD LEFT ANTECUBITAL Performed at [facility] [address] [place], [place] [address]    Special Requests   Final    BOTTLES DRAWN AEROBIC ONLY Blood Culture adequate volume Performed at [facility] [address], [place], [place] [address]    Culture   Final    NO GROWTH 5 DAYS Performed at [facility], [place] [address], [place], [place] [address]    Report Status 05/11/2022 FINAL  Final  MRSA Next Gen by PCR, Nasal     Status: Abnormal   Collection Time: 05/11/22  2:57 PM   Specimen: Nasal Mucosa; Nasal Swab  Result Value Ref Range Status   MRSA by PCR Next Gen DETECTED (  A) NOT DETECTED Final    Comment: CRITICAL RESULT CALLED TO, READ BACK BY AND VERIFIED WITH: BREN,S AT 2034 ON 05/11/22 BY VAZQUEZ,J (NOTE) The GeneXpert MRSA Assay (FDA approved for NASAL specimens only), is one component of a comprehensive MRSA colonization surveillance program. It is not intended to diagnose MRSA infection nor to  guide or monitor treatment for MRSA infections. Test performance is not FDA approved in patients less than 48 years old. Performed at [facility], [place] [address], [place], [place] [address]   Culture, blood (Routine X 2) w Reflex to ID Panel     Status: None (Preliminary result)   Collection Time: 05/11/22  3:13 PM   Specimen: BLOOD  Result Value Ref Range Status   Specimen Description   Final    BLOOD BLOOD LEFT FOREARM Performed at [facility] [address], [place], [place] [address]    Special Requests   Final    IN PEDIATRIC BOTTLE Blood Culture adequate volume Performed at [place] [address], [place], [place] [address]    Culture   Final    NO GROWTH 2 DAYS Performed at [facility] [address], [place], [place] [address]    Report Status PENDING  Incomplete  Culture, blood (Routine X 2) w Reflex to ID Panel     Status: None (Preliminary result)   Collection Time: 05/11/22  3:14 PM   Specimen: BLOOD  Result Value Ref Range Status   Specimen Description   Final    BLOOD BLOOD LEFT HAND Performed at [facility] [address] [place], [place] [address]    Special Requests   Final    IN PEDIATRIC BOTTLE Blood Culture adequate volume Performed at [facility] [address], [place], [place] [address]    Culture   Final    NO GROWTH 2 DAYS Performed at [place] [place] [address], [place], [place] [address]    Report Status PENDING  Incomplete         Radiology Studies: EEG adult  Result Date: 2022/06/07 Lora Havens, MD     2022-06-07  3:55 PM Patient Name: TYJAY GALINDO MRN: [id number] Epilepsy Attending: Lora Havens Referring Physician/Provider: Donita Brooks, NP Date: 2022-06-07 Duration: 26.50 mins Patient history: 76yo F with ams. EEG to evaluate for seizure. Level of alertness: Awake AEDs during EEG study: None  Technical aspects: This EEG study was done with scalp electrodes positioned according to the 10-20 International system of electrode placement. Electrical activity was reviewed with band pass filter of 1-'70Hz'$ , sensitivity of 7 uV/mm, display speed of 24m/sec with a '60Hz'$  notched filter applied as appropriate. EEG data were recorded continuously and digitally stored.  Video monitoring was available and reviewed as appropriate. Description: The posterior dominant rhythm consists of 8 Hz activity of moderate voltage (25-35 uV) seen predominantly in posterior head regions, symmetric and reactive to eye opening and eye closing. EEG showed intermittent generalized 3 to 6 Hz theta-delta slowing. Hyperventilation and photic stimulation were not performed.   ABNORMALITY - Intermittent slow, generalized IMPRESSION: This study is suggestive of mild diffuse encephalopathy, nonspecific etiology. No seizures or epileptiform discharges were seen throughout the recording. PLora Havens   DG Abd Portable 1V  Result Date: 05/11/2022 CLINICAL DATA:  NG tube placement EXAM: PORTABLE ABDOMEN - 1 VIEW COMPARISON:  Chest 05/11/2022 FINDINGS: No enteric tube is demonstrated within the field  of view. Visualized bowel gas pattern is normal with scattered gas in the colon. No small or large bowel distention. Surgical clips consistent with mesh hernia repair. IMPRESSION: No enteric tube demonstrated within the field of view. Electronically Signed   By: Lucienne Capers M.D.   On: 05/11/2022 17:56   DG CHEST PORT 1 VIEW  Result Date: 05/11/2022 CLINICAL DATA:  Encounter for hypoxemia EXAM: PORTABLE CHEST 1 VIEW COMPARISON:  Chest radiograph dated May 06, 2022 FINDINGS: The heart is enlarged. Bilateral lower lobe hazy opacities concerning for pulmonary edema or infiltrate. Small to moderate bilateral pleural effusions. Thoracic spondylosis. No acute osseous abnormality. IMPRESSION: 1. Stable cardiomegaly. 2. Bilateral perihilar hazy  opacities with pleural effusions suggesting pulmonary edema, suggesting worsening of CHF or development of airspace disease, clinical correlation is suggested. Electronically Signed   By: Keane Police D.O.   On: 05/11/2022 14:24        Scheduled Meds:  apixaban  5 mg Oral BID   atorvastatin  20 mg Oral Daily   calcium carbonate  1 tablet Oral TID WC   Chlorhexidine Gluconate Cloth  6 each Topical Daily   cholestyramine light  4 g Oral QID   feeding supplement  1 Container Oral Q24H   feeding supplement  237 mL Oral TID BM   FLUoxetine  40 mg Oral Daily   insulin aspart  0-15 Units Subcutaneous Q4H   methimazole  10 mg Oral TID   multivitamin with minerals  1 tablet Oral Daily   mupirocin ointment  1 Application Nasal BID   Continuous Infusions:   LOS: 7 days    Time spent: 35 minutes    Gearld Kerstein A Wilton Thrall, MD [facility]   If 7PM-7AM, please contact night-coverage [email or web address]  05/13/2022, 1:08 PM

## 2022-05-14 DIAGNOSIS — E0590 Thyrotoxicosis, unspecified without thyrotoxic crisis or storm: Secondary | ICD-10-CM | POA: Diagnosis not present

## 2022-05-14 LAB — CBC
HCT: 45.3 % (ref 39.0–52.0)
Hemoglobin: 14.5 g/dL (ref 13.0–17.0)
MCH: 29.1 pg (ref 26.0–34.0)
MCHC: 32 g/dL (ref 30.0–36.0)
MCV: 91 fL (ref 80.0–100.0)
Platelets: 210 10*3/uL (ref 150–400)
RBC: 4.98 MIL/uL (ref 4.22–5.81)
RDW: 15.6 % — ABNORMAL HIGH (ref 11.5–15.5)
WBC: 9.7 10*3/uL (ref 4.0–10.5)
nRBC: 0 % (ref 0.0–0.2)

## 2022-05-14 LAB — BASIC METABOLIC PANEL
Anion gap: 11 (ref 5–15)
Anion gap: 8 (ref 5–15)
BUN: 23 mg/dL (ref 8–23)
BUN: 22 mg/dL (ref 8–23)
CO2: 27 mmol/L (ref 22–32)
CO2: 27 mmol/L (ref 22–32)
Calcium: 8.8 mg/dL — ABNORMAL LOW (ref 8.9–10.3)
Calcium: 8.9 mg/dL (ref 8.9–10.3)
Chloride: 105 mmol/L (ref 98–111)
Chloride: 109 mmol/L (ref 98–111)
Creatinine, Ser: 1.08 mg/dL (ref 0.61–1.24)
Creatinine, Ser: 1.06 mg/dL (ref 0.61–1.24)
GFR, Estimated: >60 mL/min (ref >60)
GFR, Estimated: >60 mL/min (ref >60)
Glucose, Bld: 107 mg/dL — ABNORMAL HIGH (ref 70–99)
Glucose, Bld: 116 mg/dL — ABNORMAL HIGH (ref 70–99)
Potassium: 3.3 mmol/L — ABNORMAL LOW (ref 3.5–5.1)
Potassium: 3.5 mmol/L (ref 3.5–5.1)
Sodium: 143 mmol/L (ref 135–145)
Sodium: 144 mmol/L (ref 135–145)

## 2022-05-14 LAB — BLOOD GAS, ARTERIAL
Acid-Base Excess: 5 mmol/L — ABNORMAL HIGH (ref 0.0–2.0)
Bicarbonate: 29.9 mmol/L — ABNORMAL HIGH (ref 20.0–28.0)
O2 Saturation: 97.5 %
Patient temperature: 36.8
pCO2 arterial: 44 mmHg (ref 32–48)
pH, Arterial: 7.44 (ref 7.35–7.45)
pO2, Arterial: 80 mmHg — ABNORMAL LOW (ref 83–108)

## 2022-05-14 LAB — GLUCOSE, CAPILLARY
Glucose-Capillary: 102 mg/dL — ABNORMAL HIGH (ref 70–99)
Glucose-Capillary: 105 mg/dL — ABNORMAL HIGH (ref 70–99)
Glucose-Capillary: 111 mg/dL — ABNORMAL HIGH (ref 70–99)
Glucose-Capillary: 135 mg/dL — ABNORMAL HIGH (ref 70–99)
Glucose-Capillary: 137 mg/dL — ABNORMAL HIGH (ref 70–99)
Glucose-Capillary: 95 mg/dL (ref 70–99)

## 2022-05-14 LAB — MAGNESIUM
Magnesium: 2 mg/dL (ref 1.7–2.4)
Magnesium: 2.3 mg/dL (ref 1.7–2.4)

## 2022-05-14 LAB — PROCALCITONIN: Procalcitonin: <0.1 ng/mL

## 2022-05-14 LAB — AMMONIA: Ammonia: 25 umol/L (ref 9–35)

## 2022-05-14 MED ORDER — POTASSIUM CHLORIDE CRYS ER 20 MEQ PO TBCR
20.0000 meq | EXTENDED_RELEASE_TABLET | Freq: Once | ORAL | Status: AC
  Administered 2022-05-14: 20 meq via ORAL
  Filled 2022-05-14: qty 1

## 2022-05-14 MED ORDER — BISACODYL 5 MG PO TBEC
5.0000 mg | DELAYED_RELEASE_TABLET | Freq: Once | ORAL | Status: AC
Start: 2022-05-14 — End: 2022-05-14
  Administered 2022-05-14: 5 mg via ORAL
  Filled 2022-05-14 (×2): qty 1

## 2022-05-14 NOTE — Progress Notes (Signed)
PROGRESS NOTE    [name]  FXT:[id number] DOB: 07-06-1946 DOA: 05/06/2022 PCP: [name], MD   Brief Narrative: 76 year old with past medical history significant for PAF on Eliquis, hyperlipidemia, diabetes type 2, chronic heart failure reduced ejection fraction 45%, dementia.  Patient presented with altered mental status, increased confusion over the last several days.  He has been having hallucination.  Patient was a started on Seroquel a couple of days prior to admission.  He has been following with his PCP for worsening dementia.  Evaluation in the ED GL hypothyroidism, with a TSH of 0.01, free T4 at 4.0, free T3 at 5.  He was started on methimazole and amiodarone was discontinued.  Patient had some SVT , 2D echo showed lower ejection fraction to 25 to 30%.  Global hypokinesis.  Cardiology has been consulted.  He has been confused, and agitated.  MRI was ordered 7/25th.  Patient received IV Ativan, he became more lethargic 7/26 after he received ativan  from prior night. ABG showed acute hypoxic, hypercapnic respiratory failure. He was transfer to ICU. CCM consulted. Patient was placed on supplemental oxygen. His mental status has improved, his respiratory failure has improved as well.   [facility] stay due to delirium, encephalopathy, acute respiratory failure.   Assessment & Plan:   Principal Problem:   Hyperthyroidism Active Problems:   Essential hypertension   HLD (hyperlipidemia)   DM2 (diabetes mellitus, type 2) (HCC)   Chronic HFrEF (heart failure with reduced ejection fraction) (HCC)   PAF (paroxysmal atrial fibrillation) (HCC)   Acute metabolic encephalopathy   Dementia with behavioral disturbance (HCC)   Stage 3a chronic kidney disease (CKD) (HCC)   Elevated troponin   Elevated d-dimer  1-Hyperthyroidism: -Patient presented with a TSH of 0.01, free T3 at 5, free T4 at 4. -Patient was a started on methimazole/  -Hold on BB  due to bradycardia -Repeated  free T3 : 3.6 and free T4 down to 3.1. -Started  patient on cholestyramine 4 g 4 times a day.    2-Acute metabolic encephalopathy -In the setting of hyperthyroidism -Hopefully with treating hyperthyroidism confusion will improve. -MRI ordered, he got agitated and unable to complete MRI.  He received Ativan -He became more  lethargic  after he received Ativan.  -ABG showed acute hypoxic , hypercapnic resp failure.  -Avoid benzos.  -Repeated Blood culture: No growth to date.  -pro-clacitonin. Negative -still having hallucination and confuse. More calm.   3-PAF: -Metoprolol held due to Bradycardia.  -Amiodarone discontinue due to hyperthyroidism.  -Cardiology consulted, follow input.   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  Cardiology consulted. Appreciate assistance.  Hold diuretics due to hypernatremia.  Per cardiology might be able to start BB if HR tolerates it.   5-Acute Hypoxic Respiratory Failure;  Patient more lethargic 7/26.  Secondary to Ativan, hypoventilation. .  ABG showed PO2: 66, PCO2 49. Chest x ray: pulmonary edema vs infiltrates. Received one dose of IV lasix 7/26.Marland Kitchen  CCM consulted. Recommended Oxygen supplementation, transfer to ICU.  Off oxygen.  Holding lasix due to hypernatremia   Diabetes type 2:  Hypoglycemia; due to poor oral intake. Monitor.  Continue with a sliding scale insulin  Dementia with behavioral disturbance.  Seroquel has been on hold, he started to have hallucination after he was started on Seroquel. Delirium precaution.   Hyperlipidemia:  Continue with Lipitor  Hypertension: spironolactone and Lasix on hold due to AKI.  Elevated D-dimer: Doppler negative for DVT Continue with Eliquis, he was already on Eliquis for PAF  Hypokalemia:Replaced.   Generalized  weakness: PT , OT   AKI on CKD stage IIIa;  Cr Peal to 1.6 Has urine retention, foley catheter placed.  Hold diuretics.  Cr trending down.   Hypernatremia; encourage oral intake.   30,000 staph in urine. Blood culture negative   Nutrition Problem: Increased nutrient needs Etiology: acute illness    Signs/Symptoms: estimated needs    Interventions: Ensure Enlive (each supplement provides 350kcal and 20 grams of protein), Liberalize Diet  Estimated body mass index is 33.46 kg/m as calculated from the following:   Height as of this encounter: '6\' 2"'$  (1.88 m).   Weight as of this encounter: 118.2 kg.   DVT prophylaxis: Eliquis Code Status: Full code Family Communication: Wife 7/28 . Will consult palliative for goals of care.  Disposition Plan:  Status is: Inpatient Remains inpatient appropriate because: management of hyperthyroidism     Consultants:  Cardiology  Procedures:  ECHO  Antimicrobials:    Subjective: He is confuse, pleasant. Per staff he has been having hallucination. When I asked he denies.   Objective: Vitals:   05/14/22 1000 05/14/22 1100 05/14/22 1156 05/14/22 1200  BP: 136/74 140/75  (!) 150/102  Pulse: 90 86  86  Resp: 17 20  (!) 25  Temp:   98.1 F (36.7 C)   TempSrc:   Oral   SpO2: 91% 96%  100%  Weight:      Height:        Intake/Output Summary (Last 24 hours) at 05/14/2022 1421 Last data filed at 05/14/2022 0800 Gross per 24 hour  Intake 240 ml  Output 725 ml  Net -485 ml    Filed Weights   05/12/22 0346 05/13/22 0439 05/14/22 0500  Weight: 118.4 kg 118.5 kg 118.2 kg    Examination:  General exam: NAD Respiratory system: CTA Cardiovascular system: S 1, S 2 RRR Gastrointestinal system: BS present, soft, nt Central nervous system:alert, confuse, follows command Extremities: no edema, Hyperpigmentation Lower extremities.    Data Reviewed: I have personally reviewed following labs and imaging studies  CBC: Recent Labs   Lab 05/08/22 0829 05/09/22 0529 05/10/22 0843 05/11/22 0834 05/12/22 0302 05/13/22 0304 05/14/22 0249  WBC 7.7 8.5 8.7 10.0 10.9* 11.1* 9.7  NEUTROABS 5.9 6.7 6.8  --   --   --   --   HGB 14.7 14.5 14.6 15.3 14.7 14.1 14.5  HCT 46.5 46.0 46.3 48.7 47.8 44.9 45.3  MCV 91.2 90.9 91.3 91.4 94.3 91.6 91.0  PLT 273 298 253 298 217 220 841    Basic Metabolic Panel: Recent Labs  Lab 05/10/22 0843 05/11/22 0834 05/11/22 1429 05/12/22 0302 05/13/22 0304 05/14/22 0249  NA 141 142  --  146* 148* 143  K 3.3* 4.0  --  3.8 3.6 3.3*  CL 107 108  --  107 109 105  CO2 24 22  --  '27 29 27  '$ GLUCOSE 106* 120*  --  86 109* 107*  BUN 29* 32*  --  33* 31* 23  CREATININE 1.40* 1.49*  --  1.63* 1.32* 1.08  CALCIUM 8.6* 8.7*  --  8.4* 8.8* 8.8*  MG  --   --  2.1 2.1  --  2.0  PHOS  --   --   --  5.4*  --   --  GFR: Estimated Creatinine Clearance: 79.5 mL/min (by C-G formula based on SCr of 1.08 mg/dL). Liver Function Tests: Recent Labs  Lab 05/12/22 0302 05/13/22 0304  AST 17 17  ALT 16 15  ALKPHOS 56 61  BILITOT 0.7 0.9  PROT 5.7* 5.9*  ALBUMIN 2.9* 3.1*    No results for input(s): "LIPASE", "AMYLASE" in the last 168 hours. No results for input(s): "AMMONIA" in the last 168 hours.  Coagulation Profile: No results for input(s): "INR", "PROTIME" in the last 168 hours. Cardiac Enzymes: Recent Labs  Lab 05/12/22 0302  CKTOTAL 18*  CKMB 1.8    BNP (last 3 results) No results for input(s): "PROBNP" in the last 8760 hours. HbA1C: No results for input(s): "HGBA1C" in the last 72 hours. CBG: Recent Labs  Lab 05/13/22 1959 05/13/22 2332 05/14/22 0329 05/14/22 0754 05/14/22 1142  GLUCAP 120* 105* 105* 111* 135*    Lipid Profile: No results for input(s): "CHOL", "HDL", "LDLCALC", "TRIG", "CHOLHDL", "LDLDIRECT" in the last 72 hours. Thyroid Function Tests: No results for input(s): "TSH", "T4TOTAL", "FREET4", "T3FREE", "THYROIDAB" in the last 72 hours.  Anemia  Panel: No results for input(s): "VITAMINB12", "FOLATE", "FERRITIN", "TIBC", "IRON", "RETICCTPCT" in the last 72 hours.  Sepsis Labs: Recent Labs  Lab 05/11/22 1805 05/12/22 0302 05/13/22 0304 05/14/22 0249  PROCALCITON  --  <0.10 <0.10 <0.10  LATICACIDVEN 1.5 1.6  --   --      Recent Results (from the past 240 hour(s))  Resp Panel by RT-PCR (Flu A&B, Covid) Anterior Nasal Swab     Status: None   Collection Time: 05/06/22 10:12 AM   Specimen: Anterior Nasal Swab  Result Value Ref Range Status   SARS Coronavirus 2 by RT PCR NEGATIVE NEGATIVE Final    Comment: (NOTE) SARS-CoV-2 target nucleic acids are NOT DETECTED.  The SARS-CoV-2 RNA is generally detectable in upper respiratory specimens during the acute phase of infection. The lowest concentration of SARS-CoV-2 viral copies this assay can detect is 138 copies/mL. A negative result does not preclude SARS-Cov-2 infection and should not be used as the sole basis for treatment or other patient management decisions. A negative result may occur with  improper specimen collection/handling, submission of specimen other than nasopharyngeal swab, presence of viral mutation(s) within the areas targeted by this assay, and inadequate number of viral copies(<138 copies/mL). A negative result must be combined with clinical observations, patient history, and epidemiological information. The expected result is Negative.  Fact Sheet for Patients:  EntrepreneurPulse.com.au  Fact Sheet for Healthcare Providers:  IncredibleEmployment.be  This test is no t yet approved or cleared by the [place] FDA and  has been authorized for detection and/or diagnosis of SARS-CoV-2 by FDA under an Emergency Use Authorization (EUA). This EUA will remain  in effect (meaning this test can be used) for the duration of the COVID-19 declaration under Section 564(b)(1) of the Act, 21 U.S.C.section 360bbb-3(b)(1), unless  the authorization is terminated  or revoked sooner.       Influenza A by PCR NEGATIVE NEGATIVE Final   Influenza B by PCR NEGATIVE NEGATIVE Final    Comment: (NOTE) The Xpert Xpress SARS-CoV-2/FLU/RSV plus assay is intended as an aid in the diagnosis of influenza from Nasopharyngeal swab specimens and should not be used as a sole basis for treatment. Nasal washings and aspirates are unacceptable for Xpert Xpress SARS-CoV-2/FLU/RSV testing.  Fact Sheet for Patients: EntrepreneurPulse.com.au  Fact Sheet for Healthcare Providers: IncredibleEmployment.be  This test is not yet approved or cleared by  the [employer] and has been authorized for detection and/or diagnosis of SARS-CoV-2 by FDA under an Emergency Use Authorization (EUA). This EUA will remain in effect (meaning this test can be used) for the duration of the COVID-19 declaration under Section 564(b)(1) of the Act, 21 U.S.C. section 360bbb-3(b)(1), unless the authorization is terminated or revoked.  Performed at [facility], [place] [address], [place], [place] [address]   Urine Culture     Status: Abnormal   Collection Time: 05/06/22 11:50 AM   Specimen: Urine, Catheterized  Result Value Ref Range Status   Specimen Description   Final    URINE, CATHETERIZED Performed at [facility] [address] [place], [place] [address]    Special Requests   Final    NONE Performed at [facility], [place] [address] [place], [place] [address]    Culture 30,000 COLONIES/mL STAPHYLOCOCCUS SIMULANS (A)  Final   Report Status 05/08/2022 FINAL  Final   Organism ID, Bacteria STAPHYLOCOCCUS SIMULANS (A)  Final      Susceptibility   Staphylococcus simulans - MIC*    CIPROFLOXACIN <=0.5 SENSITIVE Sensitive     GENTAMICIN <=0.5 SENSITIVE Sensitive     NITROFURANTOIN <=16 SENSITIVE Sensitive     OXACILLIN <=0.25 SENSITIVE Sensitive      TETRACYCLINE <=1 SENSITIVE Sensitive     VANCOMYCIN <=0.5 SENSITIVE Sensitive     TRIMETH/SULFA <=10 SENSITIVE Sensitive     CLINDAMYCIN <=0.25 SENSITIVE Sensitive     RIFAMPIN <=0.5 SENSITIVE Sensitive     Inducible Clindamycin NEGATIVE Sensitive     * 30,000 COLONIES/mL STAPHYLOCOCCUS SIMULANS  Culture, blood (routine x 2)     Status: None   Collection Time: 05/06/22  1:07 PM   Specimen: BLOOD  Result Value Ref Range Status   Specimen Description   Final    BLOOD LEFT FOREARM Performed at [facility] [address], [place], [place] [address]    Special Requests   Final    BOTTLES DRAWN AEROBIC AND ANAEROBIC [place] Performed at [facility] [address], [place], [place] [address]    Culture   Final    NO GROWTH 5 DAYS Performed at [facility], [place] [address], [place], [place] [address]    Report Status 05/11/2022 FINAL  Final  Culture, blood (routine x 2)     Status: None   Collection Time: 05/06/22  5:15 PM   Specimen: BLOOD  Result Value Ref Range Status   Specimen Description   Final    BLOOD LEFT ANTECUBITAL Performed at [facility] [address], [place], [place] [address]    Special Requests   Final    BOTTLES DRAWN AEROBIC ONLY Blood Culture adequate volume Performed at [facility] [address] [place], [place] [address]    Culture   Final    NO GROWTH 5 DAYS Performed at [facility], [place] [address] [place],  [address]    Report Status 05/11/2022 FINAL  Final  MRSA Next Gen by PCR, Nasal     Status: Abnormal   Collection Time: 05/11/22  2:57 PM   Specimen: Nasal Mucosa; Nasal Swab  Result Value Ref Range Status   MRSA by PCR Next Gen DETECTED (A) NOT DETECTED Final    Comment: CRITICAL RESULT CALLED TO, READ BACK BY AND VERIFIED WITH: BREN,S AT 2034 ON 05/11/22 BY VAZQUEZ,J (NOTE) The GeneXpert MRSA Assay (FDA approved  for NASAL specimens only), is  one component of a comprehensive MRSA colonization surveillance program. It is not intended to diagnose MRSA infection nor to guide or monitor treatment for MRSA infections. Test performance is not FDA approved in patients less than 16 years old. Performed at [facility], [place] [address], [place], [place] [address]   Culture, blood (Routine X 2) w Reflex to ID Panel     Status: None (Preliminary result)   Collection Time: 05/11/22  3:13 PM   Specimen: BLOOD  Result Value Ref Range Status   Specimen Description   Final    BLOOD BLOOD LEFT FOREARM Performed at [facility] [address] [place], [place] [address]    Special Requests   Final    IN PEDIATRIC BOTTLE Blood Culture adequate volume Performed at [facility] [address], [place], [place] [address]    Culture   Final    NO GROWTH 3 DAYS Performed at [facility], [place] [address], [place], [place] [address]    Report Status PENDING  Incomplete  Culture, blood (Routine X 2) w Reflex to ID Panel     Status: None (Preliminary result)   Collection Time: 05/11/22  3:14 PM   Specimen: BLOOD  Result Value Ref Range Status   Specimen Description   Final    BLOOD BLOOD LEFT HAND Performed at [name] [address], [place], [place] [address]    Special Requests   Final    IN PEDIATRIC BOTTLE Blood Culture adequate volume Performed at [facility] [address] [place], [place] [address]    Culture   Final    NO GROWTH 3 DAYS Performed at [facility], [place] [address], [place], [place] [address]    Report Status PENDING  Incomplete         Radiology Studies: EEG adult  Result Date: 06-02-2022 Lora Havens, MD     June 02, 2022  3:55 PM Patient Name: CARYL MANAS MRN: [id number] Epilepsy Attending: Lora Havens Referring Physician/Provider: Donita Brooks, NP Date: 06-02-22 Duration: 26.50  mins Patient history: 76yo F with ams. EEG to evaluate for seizure. Level of alertness: Awake AEDs during EEG study: None Technical aspects: This EEG study was done with scalp electrodes positioned according to the 10-20 International system of electrode placement. Electrical activity was reviewed with band pass filter of 1-'70Hz'$ , sensitivity of 7 uV/mm, display speed of 68m/sec with a '60Hz'$  notched filter applied as appropriate. EEG data were recorded continuously and digitally stored.  Video monitoring was available and reviewed as appropriate. Description: The posterior dominant rhythm consists of 8 Hz activity of moderate voltage (25-35 uV) seen predominantly in posterior head regions, symmetric and reactive to eye opening and eye closing. EEG showed intermittent generalized 3 to 6 Hz theta-delta slowing. Hyperventilation and photic stimulation were not performed.   ABNORMALITY - Intermittent slow, generalized IMPRESSION: This study is suggestive of mild diffuse encephalopathy, nonspecific etiology. No seizures or epileptiform discharges were seen throughout the recording. Priyanka OBarbra Sarks        Scheduled Meds:  apixaban  5 mg Oral BID   atorvastatin  20 mg Oral Daily   calcium carbonate  1 tablet Oral TID WC   Chlorhexidine Gluconate Cloth  6 each Topical Daily   cholestyramine light  4 g Oral QID   feeding supplement  1 Container Oral Q24H   feeding supplement  237 mL Oral TID BM   FLUoxetine  40 mg Oral Daily   insulin aspart  0-15 Units Subcutaneous Q4H   methimazole  10 mg Oral TID   multivitamin with minerals  1 tablet Oral Daily   mupirocin ointment  1 Application Nasal BID   Continuous Infusions:   LOS: 8 days    Time spent: 35 minutes    Milly Goggins A Corneluis Allston, MD [facility]   If 7PM-7AM, please contact night-coverage [email or web address]  05/14/2022, 2:21 PM

## 2022-05-14 NOTE — Progress Notes (Signed)
Will notify provider that I am holding medications d/t somnolence. I do not think it is safe for him to take oral medications at this time. Will re-scheduled medications for later time if his mentation improves.

## 2022-05-14 NOTE — Progress Notes (Signed)
Patient had attempted to get out of the bed prior to the start of my shift at 1900.  PT has been somnolent since that last attempt to get out of bed, awakens easily but has very poor attention span. It was reported that he has not slept in three days, and has been experiencing delirium since arrival, further complicated by history of dementia  PT work of breathing appears to be increased and he is throwing frequent PVC's mixed with runs of v-tach. 2L [place] applied, even though oxygenation per pulse ox is 96%.  No BM since 7/21, stool softeners ordered today. Ammonia requested d/t increased somnolence. Last level checked was 7/21 which was normal, recollected tonight and resulted as normal.   Potassium and magnesium are within normal limits after re-draw. Awaiting ABG results.

## 2022-05-14 NOTE — Consult Note (Signed)
Consultation Note Date: 05/14/2022   Patient Name: [name]  DOB: [birth year]  MRN: [id number]  Age / Sex: 76 y.o., male  PCP: [name], MD Referring Physician: Elmarie Shiley, MD  Reason for Consultation: Establishing goals of care  HPI/Patient Profile: 76 y.o. male  admitted on 05/06/2022 .   Clinical Assessment and Goals of Care: 76 year old gentleman with chronic systolic diastolic heart failure, nonischemic cardiomyopathy paroxysmal atrial fibrillation diabetes hypertension dyslipidemia non-Hodgkin's lymphoma history of dementia and sleep apnea admitted with acute encephalopathy, urinary tract infection.  Etiology colleagues following, also undergoing neurologic work-up.  Palliative medicine consulted for ongoing goals of care discussions. Patient is resting in bed.  He is not oriented.  He states that he lives at home with his parents.  He is not able to state his wife's name.  Call placed and discussed with wife and she arrived at bedside and further discussions were held see below Palliative medicine is specialized medical care for people living with serious illness. It focuses on providing relief from the symptoms and stress of a serious illness. The goal is to improve quality of life for both the patient and the family. Goals of care: Broad aims of medical therapy in relation to the patient's values and preferences. Our aim is to provide medical care aimed at enabling patients to achieve the goals that matter most to them, given the circumstances of their particular medical situation and their constraints.    NEXT OF KIN Lives at home with wife, has 2 children.  SUMMARY OF RECOMMENDATIONS   Full code full scope care, discussed with the wife who arrived at the bedside.  She states that the patient had mild cognitive deficits but was completely independent and ambulating 2 weeks ago.   She states that she is surprised by his ongoing decline in the last couple of weeks.  She states that he has completed advance directives she will try to find a living will documents and review them.  We discussed about goals of care particularly CODE STATUS.  We will review previously prepared documents and I will follow-up with her in a.m. on 05-15-2022 Thank you for the consult.  Code Status/Advance Care Planning: Full code   Symptom Management:    Palliative Prophylaxis:  Delirium Protocol  Additional Recommendations (Limitations, Scope, Preferences): Full Scope Treatment  Psycho-social/Spiritual:  Desire for further Chaplaincy support:yes Additional Recommendations: Caregiving  Support/Resources  Prognosis:  Unable to determine  Discharge Planning: To Be Determined      Primary Diagnoses: Present on Admission:  Acute metabolic encephalopathy  Essential hypertension  Chronic HFrEF (heart failure with reduced ejection fraction) (HCC)  HLD (hyperlipidemia)  PAF (paroxysmal atrial fibrillation) (HCC)  Dementia with behavioral disturbance (HCC)  Stage 3a chronic kidney disease (CKD) (HCC)  Elevated troponin  Elevated d-dimer  Hyperthyroidism   I have reviewed the medical record, interviewed the patient and family, and examined the patient. The following aspects are pertinent.  Past Medical History:  Diagnosis Date   Chronic systolic CHF (  congestive heart failure) ([place])    a. Echo (08/28/13): Mild LVH, EF 35-40%, diffuse HK, moderate to severe LAE.   Diabetes mellitus without complication ([place])    Diverticulitis    Headache(784.0)    Hyperlipidemia    Hypertension    Lymphoma, non Hodgkin's    sees Dr. [name]    Morbid obesity [facility])    NICM (nonischemic cardiomyopathy) ([place])    a. R/L Heart cath 08/30/13: RA mean 8, RV 30/0, PA 27/12, mean PCWP 9, CO 5.67, CI 1.98; normal coronary arteries   Sleep apnea    Social History   Socioeconomic History   Marital  status: Married    Spouse name: Not on file   Number of children: Not on file   Years of education: Not on file   Highest education level: Not on file  Occupational History   Occupation: retired--postal Secretary/administrator: RETIRED  Tobacco Use   Smoking status: Former    Packs/day: 2.00    Years: 52.00    Total pack years: 104.00    Types: Cigarettes    Start date: 08/27/1965    Quit date: 08/28/1999    Years since quitting: 22.7   Smokeless tobacco: Never   Tobacco comments:    quit 13 years ago  Vaping Use   Vaping Use: Never used  Substance and Sexual Activity   Alcohol use: Yes    Alcohol/week: 0.0 standard drinks of alcohol    Comment: occ   Drug use: No   Sexual activity: Not on file  Other Topics Concern   Not on file  Social History Narrative   Lives with wife in two story home      Right handed      Highest level of edu- some college      Retired   Investment banker, operational of Radio broadcast assistant Strain: [place]  (04/28/2021)   Overall Financial Resource Strain (CARDIA)    Difficulty of Paying Living Expenses: Not hard at all  Food Insecurity: No [place] (04/28/2021)   Hunger Vital Sign    Worried About Running Out of Food in the Last Year: Never true    [place] in the Last Year: Never true  Transportation Needs: No Transportation Needs (04/28/2021)   PRAPARE - Hydrologist (Medical): No    Lack of Transportation (Non-Medical): No  Physical Activity: Inactive (04/28/2021)   Exercise Vital Sign    Days of Exercise per Week: 0 days    Minutes of Exercise per Session: 0 min  Stress: No Stress Concern Present (04/28/2021)   [place]    Feeling of Stress : Not at all  Social Connections: Moderately Isolated (04/28/2021)   Social Connection and Isolation Panel [NHANES]    Frequency of Communication with Friends and Family: Three times a week     Frequency of Social Gatherings with Friends and Family: Three times a week    Attends Religious Services: Never    Active Member of Clubs or Organizations: No    Attends Archivist Meetings: Never    Marital Status: Married   Family History  Problem Relation Age of Onset   Heart failure Mother    Hypertension Mother    Cancer Father        colon   Hypertension Father    Diabetes Father    Stroke Maternal Grandmother  Diabetes Paternal Grandfather    Cancer Maternal Grandfather    Scheduled Meds:  apixaban  5 mg Oral BID   atorvastatin  20 mg Oral Daily   calcium carbonate  1 tablet Oral TID WC   Chlorhexidine Gluconate Cloth  6 each Topical Daily   cholestyramine light  4 g Oral QID   feeding supplement  1 Container Oral Q24H   feeding supplement  237 mL Oral TID BM   FLUoxetine  40 mg Oral Daily   insulin aspart  0-15 Units Subcutaneous Q4H   methimazole  10 mg Oral TID   multivitamin with minerals  1 tablet Oral Daily   mupirocin ointment  1 Application Nasal BID   Continuous Infusions: PRN Meds:.acetaminophen **OR** acetaminophen, mouth rinse, mouth rinse Medications Prior to Admission:  Prior to Admission medications   Medication Sig Start Date End Date Taking? Authorizing Provider  acetaminophen (TYLENOL) 325 MG tablet Take 650 mg by mouth 2 (two) times daily as needed (pain).   Yes [provider]  amiodarone (PACERONE) 200 MG tablet Take 1 tablet (200 mg total) by mouth daily. 11/05/21  Yes Buford Dresser, MD  apixaban (ELIQUIS) 5 MG TABS tablet TAKE 1 TABLET BY MOUTH TWICE A DAY Patient taking differently: Take 5 mg by mouth 2 (two) times daily. 08/23/21  Yes Buford Dresser, MD  atorvastatin (LIPITOR) 20 MG tablet TAKE 1 TABLET BY MOUTH EVERY DAY Patient taking differently: Take 20 mg by mouth daily. 04/04/22  Yes [name], MD  FLUoxetine (PROZAC) 40 MG capsule TAKE 1 CAPSULE BY MOUTH EVERY DAY Patient taking differently:  Take 40 mg by mouth daily. 01/03/22  Yes [name], MD  furosemide (LASIX) 40 MG tablet TAKE 1 TABLET BY MOUTH EVERY DAY Patient taking differently: Take 40 mg by mouth daily. 04/04/22  Yes [name], MD  metFORMIN (GLUCOPHAGE) 500 MG tablet TAKE 1 TABLET BY MOUTH TWICE DAILY WITH A MEAL Patient taking differently: Take 500 mg by mouth 2 (two) times daily with a meal. 09/21/21  Yes [name], MD  metoprolol (TOPROL-XL) 200 MG 24 hr tablet TAKE 1 TABLET BY MOUTH EVERY DAY Patient taking differently: Take 200 mg by mouth daily. 04/04/22  Yes [name], MD  Multiple Vitamins-Minerals (MULTIVITAMIN MEN 50+) TABS Take 1 tablet by mouth daily.   Yes [provider]  QUEtiapine (SEROQUEL) 50 MG tablet Take 1 tablet (50 mg total) by mouth at bedtime. 05/04/22  Yes [name], MD  spironolactone (ALDACTONE) 25 MG tablet TAKE 1 TABLET BY MOUTH EVERY DAY Patient taking differently: Take 25 mg by mouth daily. 12/28/21  Yes [name], MD  blood glucose meter kit and supplies Dispense based on patient and insurance preference. Use up to four times daily as directed. (FOR ICD-10 E10.9, E11.9). 03/15/22   [name], MD   No Known Allergies Review of Systems Confused Physical Exam Awakens easily, reasonably alert however is not oriented States that he lives at home with his parents Regular work of breathing Appears with generalized weakness and chronically ill S1-S2 monitor noted No edema  Vital Signs: BP (!) 150/102   Pulse 86   Temp 98.1 F (36.7 C) (Oral)   Resp (!) 25   Ht _0  (1.88 m)   Wt 118.2 kg   SpO2 100%   BMI 33.46 kg/m  Pain Scale: PAINAD POSS *See Group Information*: 1-Acceptable,Awake and alert Pain Score: Asleep   SpO2: SpO2: 100 %  O2 Device:SpO2: 100 % O2 Flow Rate: .O2 Flow Rate (L/min): 2 L/min  IO: Intake/output summary:  Intake/Output Summary (Last 24 hours) at 05/14/2022 1420 Last data filed at 05/14/2022 0800 Gross per 24 hour   Intake 240 ml  Output 725 ml  Net -485 ml    LBM: Last BM Date : 05/06/22 Baseline Weight: Weight: 120.7 kg Most recent weight: Weight: 118.2 kg     Palliative Assessment/Data:     Palliative performance scale 40%  Time In: 1320 Time Out: 1420 Time Total: 60 Greater than 50%  of this time was spent counseling and coordinating care related to the above assessment and plan.  Signed by: Loistine Chance, MD   Please contact Palliative Medicine Team phone at [phone] for questions and concerns.  For individual provider: See Shea Evans

## 2022-05-14 NOTE — Progress Notes (Signed)
Progress Note  Patient Name: [name] Date of Encounter: 05/14/2022  [facility] Cardiologist: Buford Dresser, MD   Subjective   He is slumped over. Not speaking. Held out his hand when I said hello  Inpatient Medications    Scheduled Meds:  apixaban  5 mg Oral BID   atorvastatin  20 mg Oral Daily   calcium carbonate  1 tablet Oral TID WC   Chlorhexidine Gluconate Cloth  6 each Topical Daily   cholestyramine light  4 g Oral QID   feeding supplement  1 Container Oral Q24H   feeding supplement  237 mL Oral TID BM   FLUoxetine  40 mg Oral Daily   insulin aspart  0-15 Units Subcutaneous Q4H   methimazole  10 mg Oral TID   multivitamin with minerals  1 tablet Oral Daily   mupirocin ointment  1 Application Nasal BID   Continuous Infusions:  PRN Meds: acetaminophen **OR** acetaminophen, mouth rinse, mouth rinse   Vital Signs    Vitals:   05/14/22 1000 05/14/22 1100 05/14/22 1156 05/14/22 1200  BP: 136/74 140/75  (!) 150/102  Pulse: 90 86  86  Resp: 17 20  (!) 25  Temp:   98.1 F (36.7 C)   TempSrc:   Oral   SpO2: 91% 96%  100%  Weight:      Height:        Intake/Output Summary (Last 24 hours) at 05/14/2022 1249 Last data filed at 05/14/2022 0800 Gross per 24 hour  Intake 240 ml  Output 725 ml  Net -485 ml      05/14/2022    5:00 AM 05/13/2022    4:39 AM 05/12/2022    3:46 AM  Last 3 Weights  Weight (lbs) 260 lb 9.3 oz 261 lb 3.9 oz 261 lb 0.4 oz  Weight (kg) 118.2 kg 118.5 kg 118.4 kg      Telemetry    Sinus, mild NSVT, PVCs - Personally Reviewed  ECG    N/a - Personally Reviewed  Physical Exam   VS:  BP (!) 150/102   Pulse 86   Temp 98.1 F (36.7 C) (Oral)   Resp (!) 25   Ht '6\' 2"'$  (1.88 m)   Wt 118.2 kg   SpO2 100%   BMI 33.46 kg/m  , BMI Body mass index is 33.46 kg/m. GENERAL:  slumped over, can awaken with calling his name; delerium HEENT: Pupils equal round and reactive, fundi not visualized, oral mucosa  unremarkable NECK:  No jugular venous distention, waveform within normal limits, carotid upstroke brisk and symmetric, no bruits, no thyromegaly LUNGS:  Clear to auscultation bilaterally HEART:  RRR.  PMI not displaced or sustained,S1 and S2 within normal limits, no S3, no S4, no clicks, no rubs, no murmurs ABD:  non distended EXT:  2 plus pulses throughout, no edema, no cyanosis no clubbing SKIN:  No rashes no nodules NEURO:  Cranial nerves II through XII grossly intact, motor grossly intact throughout PSYCH:  delerius  Labs    High Sensitivity Troponin:   Recent Labs  Lab 05/06/22 1012 05/06/22 1140  TROPONINIHS 83* 86*     Chemistry Recent Labs  Lab 05/11/22 1429 05/12/22 0302 05/13/22 0304 05/14/22 0249  NA  --  146* 148* 143  K  --  3.8 3.6 3.3*  CL  --  107 109 105  CO2  --  '27 29 27  '$ GLUCOSE  --  86 109* 107*  BUN  --  33* 31* 23  CREATININE  --  1.63* 1.32* 1.08  CALCIUM  --  8.4* 8.8* 8.8*  MG 2.1 2.1  --  2.0  PROT  --  5.7* 5.9*  --   ALBUMIN  --  2.9* 3.1*  --   AST  --  17 17  --   ALT  --  16 15  --   ALKPHOS  --  56 61  --   BILITOT  --  0.7 0.9  --   GFRNONAA  --  43* 56* >60  ANIONGAP  --  '12 10 11    '$ Lipids No results for input(s): "CHOL", "TRIG", "HDL", "LABVLDL", "LDLCALC", "CHOLHDL" in the last 168 hours.  Hematology Recent Labs  Lab 05/12/22 0302 05/13/22 0304 05/14/22 0249  WBC 10.9* 11.1* 9.7  RBC 5.07 4.90 4.98  HGB 14.7 14.1 14.5  HCT 47.8 44.9 45.3  MCV 94.3 91.6 91.0  MCH 29.0 28.8 29.1  MCHC 30.8 31.4 32.0  RDW 15.9* 15.6* 15.6*  PLT 217 220 210   Thyroid  Recent Labs  Lab 05/11/22 0929  FREET4 3.16*    BNP Recent Labs  Lab 05/12/22 1253  BNP 1,314.2*    DDimer  No results for input(s): "DDIMER" in the last 168 hours.    Radiology    EEG adult  Result Date: 05/12/2022 Lora Havens, MD     05/12/2022  3:55 PM Patient Name: [name] MRN: [id number] Epilepsy Attending: Lora Havens Referring  Physician/Provider: Donita Brooks, NP Date: 05/12/2022 Duration: 26.50 mins Patient history: 76yo F with ams. EEG to evaluate for seizure. Level of alertness: Awake AEDs during EEG study: None Technical aspects: This EEG study was done with scalp electrodes positioned according to the 10-20 International system of electrode placement. Electrical activity was reviewed with band pass filter of 1-'70Hz'$ , sensitivity of 7 uV/mm, display speed of 50m/sec with a '60Hz'$  notched filter applied as appropriate. EEG data were recorded continuously and digitally stored.  Video monitoring was available and reviewed as appropriate. Description: The posterior dominant rhythm consists of 8 Hz activity of moderate voltage (25-35 uV) seen predominantly in posterior head regions, symmetric and reactive to eye opening and eye closing. EEG showed intermittent generalized 3 to 6 Hz theta-delta slowing. Hyperventilation and photic stimulation were not performed.   ABNORMALITY - Intermittent slow, generalized IMPRESSION: This study is suggestive of mild diffuse encephalopathy, nonspecific etiology. No seizures or epileptiform discharges were seen throughout the recording. PLora Havens    Cardiac Studies   ECHO: 05/07/2022 IMPRESSIONS   1. Left ventricular ejection fraction, by estimation, is 25 to 30%. The left ventricle has severely decreased function. The left ventricle  demonstrates global hypokinesis. There is mild left ventricular  hypertrophy. Left ventricular diastolic parameters  are consistent with Grade III diastolic dysfunction (restrictive).   2. Right ventricular systolic function is mildly reduced. The right  ventricular size is mildly enlarged.   3. Left atrial size was severely dilated.   4. Right atrial size was mild to moderately dilated.   5. The mitral valve is normal in structure. Mild to moderate mitral valve regurgitation. No evidence of mitral stenosis.   6. The aortic valve is tricuspid. There is  mild calcification of the  aortic valve. There is mild thickening of the aortic valve. Aortic valve regurgitation is mild. No aortic stenosis is present.   7. Aortic dilatation noted. There is borderline dilatation of the aortic  root, measuring 41  mm.   Patient Profile     [name] is a 28M with chronic systolic diastolic heart failure (LVEF 35 to 40%) non-ischemic cardiomyopathy, PAF, diabetes, hypertension, hyperlipidemia, non-Hodgkin's lymphoma, dementia, and OSA admitted with acute encephalopathy and UTI on chronic dementia  Assessment & Plan    # Chronic systolic and diastolic heart failure: LVEF 45-50% at baseline, 25-30% .  His volume status appears stable, though BNP was 1314 yesterday.  Renal function worsened with diuresis and serum sodium continues to increase, indicating intravscular volume depletion.  Would continue to hold on diuresis.  Renal function is improving. Encourage hydration.  He is not interactive and unable to participate in an ischemic w/u at this time considering his dementia.  Will aim to restart metop if rates can tolerate. If crt stays stable and restart spironolactone.  Bradycardia: Noted prior. NSR today. Can continue to observe and if rates ok today can reconsider restarting metop at a lower dose to help suppress his ectopy.   # Hypertension:  BP stable.  Hold home metop; holding home spironolactone.  # PAF: NSR. Amiodarone was stopped 2/2 hyperthryoidism.  Continue Eliquis.   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  BP control as above.  Resume beta blocker prior to discharge if HR allows.   # Encephalopathy: Treated for UTI.  No seizures per neurology.  Holding benzodiazepines.  Encourage CPAP use.  # Hyperthyroidism:  Per primary team. TSH <0.010. No significant tachyarrhythmia. He is HDS. On methimazole 10 mg TID.      For questions or updates, please contact [place] Please consult [email or web address] for contact info under         Signed, Janina Mayo, MD  05/14/2022, 12:49 PM

## 2022-05-15 DIAGNOSIS — E0590 Thyrotoxicosis, unspecified without thyrotoxic crisis or storm: Secondary | ICD-10-CM | POA: Diagnosis not present

## 2022-05-15 LAB — CBC
HCT: 45.1 % (ref 39.0–52.0)
Hemoglobin: 14.1 g/dL (ref 13.0–17.0)
MCH: 28.9 pg (ref 26.0–34.0)
MCHC: 31.3 g/dL (ref 30.0–36.0)
MCV: 92.4 fL (ref 80.0–100.0)
Platelets: 186 10*3/uL (ref 150–400)
RBC: 4.88 MIL/uL (ref 4.22–5.81)
RDW: 15.6 % — ABNORMAL HIGH (ref 11.5–15.5)
WBC: 9.4 10*3/uL (ref 4.0–10.5)
nRBC: 0 % (ref 0.0–0.2)

## 2022-05-15 LAB — BASIC METABOLIC PANEL
Anion gap: 8 (ref 5–15)
BUN: 20 mg/dL (ref 8–23)
CO2: 27 mmol/L (ref 22–32)
Calcium: 8.6 mg/dL — ABNORMAL LOW (ref 8.9–10.3)
Chloride: 106 mmol/L (ref 98–111)
Creatinine, Ser: 1.02 mg/dL (ref 0.61–1.24)
GFR, Estimated: >60 mL/min (ref >60)
Glucose, Bld: 104 mg/dL — ABNORMAL HIGH (ref 70–99)
Potassium: 3.7 mmol/L (ref 3.5–5.1)
Sodium: 141 mmol/L (ref 135–145)

## 2022-05-15 LAB — GLUCOSE, CAPILLARY
Glucose-Capillary: 109 mg/dL — ABNORMAL HIGH (ref 70–99)
Glucose-Capillary: 144 mg/dL — ABNORMAL HIGH (ref 70–99)
Glucose-Capillary: 96 mg/dL (ref 70–99)
Glucose-Capillary: 116 mg/dL — ABNORMAL HIGH (ref 70–99)
Glucose-Capillary: 125 mg/dL — ABNORMAL HIGH (ref 70–99)

## 2022-05-15 MED ORDER — HALOPERIDOL LACTATE 5 MG/ML IJ SOLN
1.0000 mg | Freq: Once | INTRAMUSCULAR | Status: AC
  Administered 2022-05-15: 1 mg via INTRAVENOUS

## 2022-05-15 MED ORDER — HALOPERIDOL LACTATE 5 MG/ML IJ SOLN
INTRAMUSCULAR | Status: AC
  Filled 2022-05-15: qty 1

## 2022-05-15 MED ORDER — OLANZAPINE 2.5 MG PO TABS: 2.5000 mg | ORAL_TABLET | Freq: Every day | ORAL | Status: DC

## 2022-05-15 MED ORDER — HALOPERIDOL LACTATE 5 MG/ML IJ SOLN
INTRAMUSCULAR | Status: AC
  Administered 2022-05-15: 1 mg via INTRAVENOUS
  Filled 2022-05-15: qty 1

## 2022-05-15 MED ORDER — OLANZAPINE 2.5 MG PO TABS
2.5000 mg | ORAL_TABLET | Freq: Once | ORAL | Status: DC
  Filled 2022-05-15: qty 1

## 2022-05-15 MED ORDER — SPIRONOLACTONE 25 MG PO TABS
25.0000 mg | ORAL_TABLET | Freq: Every day | ORAL | Status: DC
  Administered 2022-05-15 – 2022-05-23 (×9): 25 mg via ORAL
  Filled 2022-05-15 (×10): qty 1

## 2022-05-15 MED ORDER — HALOPERIDOL LACTATE 5 MG/ML IJ SOLN: 1.0000 mg | Freq: Once | INTRAMUSCULAR | Status: AC

## 2022-05-15 MED ORDER — METOPROLOL SUCCINATE ER 25 MG PO TB24
25.0000 mg | ORAL_TABLET | Freq: Every day | ORAL | Status: DC
  Administered 2022-05-15 – 2022-05-23 (×9): 25 mg via ORAL
  Filled 2022-05-15 (×9): qty 1

## 2022-05-15 MED ORDER — OLANZAPINE 2.5 MG PO TABS
2.5000 mg | ORAL_TABLET | Freq: Every day | ORAL | Status: DC | PRN
  Administered 2022-05-15 – 2022-05-21 (×4): 2.5 mg via ORAL
  Filled 2022-05-15 (×7): qty 1

## 2022-05-15 MED ORDER — LACTULOSE 10 GM/15ML PO SOLN
30.0000 g | Freq: Every day | ORAL | Status: DC
  Administered 2022-05-15 – 2022-05-23 (×9): 30 g via ORAL
  Filled 2022-05-15 (×9): qty 45

## 2022-05-15 NOTE — Progress Notes (Signed)
Progress Note  Patient Name: [name] Date of Encounter: 05/15/2022  [facility] Cardiologist: Buford Dresser, MD   Subjective   Wakes up to calling his name. Can say "ok". Drooling.   Inpatient Medications    Scheduled Meds:  apixaban  5 mg Oral BID   atorvastatin  20 mg Oral Daily   calcium carbonate  1 tablet Oral TID WC   Chlorhexidine Gluconate Cloth  6 each Topical Daily   cholestyramine light  4 g Oral QID   feeding supplement  1 Container Oral Q24H   feeding supplement  237 mL Oral TID BM   FLUoxetine  40 mg Oral Daily   insulin aspart  0-15 Units Subcutaneous Q4H   lactulose  30 g Oral Daily   methimazole  10 mg Oral TID   multivitamin with minerals  1 tablet Oral Daily   mupirocin ointment  1 Application Nasal BID   Continuous Infusions:  PRN Meds: acetaminophen **OR** acetaminophen, mouth rinse, mouth rinse   Vital Signs    Vitals:   05/15/22 0300 05/15/22 0400 05/15/22 0500 05/15/22 0800  BP:  (!) 155/90    Pulse:  88    Resp:  (!) 25    Temp: (!) 97.3 F (36.3 C)   98.1 F (36.7 C)  TempSrc: Axillary   Axillary  SpO2:  99%    Weight:   115.6 kg   Height:        Intake/Output Summary (Last 24 hours) at 05/15/2022 0934 Last data filed at 05/15/2022 1610 Gross per 24 hour  Intake 480 ml  Output 705 ml  Net -225 ml      05/15/2022    5:00 AM 05/14/2022    5:00 AM 05/13/2022    4:39 AM  Last 3 Weights  Weight (lbs) 254 lb 13.6 oz 260 lb 9.3 oz 261 lb 3.9 oz  Weight (kg) 115.6 kg 118.2 kg 118.5 kg      Telemetry    Sinus, mild NSVT, PVCs - Personally Reviewed  ECG    N/a - Personally Reviewed  Physical Exam   VS:  BP (!) 155/90 (BP Location: Right Arm)   Pulse 88   Temp 98.1 F (36.7 C) (Axillary)   Resp (!) 25   Ht '6\' 2"'$  (1.88 m)   Wt 115.6 kg   SpO2 99%   BMI 32.72 kg/m  , BMI Body mass index is 32.72 kg/m. GENERAL:  slumped over; sleeping but arrousable HEENT: Pupils equal round and reactive, fundi not  visualized, oral mucosa unremarkable NECK:  No jugular venous distention, waveform within normal limits, carotid upstroke brisk and symmetric, no bruits, no thyromegaly LUNGS:  Clear to auscultation bilaterally HEART:  RRR.  PMI not displaced or sustained,S1 and S2 within normal limits, no S3, no S4, no clicks, no rubs, no murmurs ABD:  non distended EXT:  2 plus pulses throughout, no edema, no cyanosis no clubbing SKIN:  No rashes no nodules NEURO:  Cranial nerves II through XII grossly intact, motor grossly intact throughout PSYCH:  delerius  Labs    High Sensitivity Troponin:   Recent Labs  Lab 05/06/22 1012 05/06/22 1140  TROPONINIHS 83* 86*     Chemistry Recent Labs  Lab 05/12/22 0302 05/13/22 0304 05/14/22 0249 05/14/22 2106 05/15/22 0745  NA 146* 148* 143 144 141  K 3.8 3.6 3.3* 3.5 3.7  CL 107 109 105 109 106  CO2 '27 29 27 27 27  '$ GLUCOSE 86 109* 107*  116* 104*  BUN 33* 31* '23 22 20  '$ CREATININE 1.63* 1.32* 1.08 1.06 1.02  CALCIUM 8.4* 8.8* 8.8* 8.9 8.6*  MG 2.1  --  2.0 2.3  --   PROT 5.7* 5.9*  --   --   --   ALBUMIN 2.9* 3.1*  --   --   --   AST 17 17  --   --   --   ALT 16 15  --   --   --   ALKPHOS 56 61  --   --   --   BILITOT 0.7 0.9  --   --   --   GFRNONAA 43* 56* >60 >60 >60  ANIONGAP '12 10 11 8 8    '$ Lipids No results for input(s): "CHOL", "TRIG", "HDL", "LABVLDL", "LDLCALC", "CHOLHDL" in the last 168 hours.  Hematology Recent Labs  Lab 05/13/22 0304 05/14/22 0249 05/15/22 0745  WBC 11.1* 9.7 9.4  RBC 4.90 4.98 4.88  HGB 14.1 14.5 14.1  HCT 44.9 45.3 45.1  MCV 91.6 91.0 92.4  MCH 28.8 29.1 28.9  MCHC 31.4 32.0 31.3  RDW 15.6* 15.6* 15.6*  PLT 220 210 186   Thyroid  Recent Labs  Lab 05/11/22 0929  FREET4 3.16*    BNP Recent Labs  Lab 05/12/22 1253  BNP 1,314.2*    DDimer  No results for input(s): "DDIMER" in the last 168 hours.    Radiology    No results found.  Cardiac Studies   ECHO: 05/07/2022 IMPRESSIONS   1. Left  ventricular ejection fraction, by estimation, is 25 to 30%. The left ventricle has severely decreased function. The left ventricle  demonstrates global hypokinesis. There is mild left ventricular  hypertrophy. Left ventricular diastolic parameters  are consistent with Grade III diastolic dysfunction (restrictive).   2. Right ventricular systolic function is mildly reduced. The right  ventricular size is mildly enlarged.   3. Left atrial size was severely dilated.   4. Right atrial size was mild to moderately dilated.   5. The mitral valve is normal in structure. Mild to moderate mitral valve regurgitation. No evidence of mitral stenosis.   6. The aortic valve is tricuspid. There is mild calcification of the  aortic valve. There is mild thickening of the aortic valve. Aortic valve regurgitation is mild. No aortic stenosis is present.   7. Aortic dilatation noted. There is borderline dilatation of the aortic  root, measuring 41 mm.   Patient Profile     Mr. 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  His volume status appears stable, though BNP was 1314 yesterday.  Renal function worsened with diuresis and serum sodium continues to increase, indicating intravscular volume depletion.  Would continue to hold on diuresis.  Renal function is improving. Encourage hydration.  He is not interactive and unable to participate in an ischemic w/u at this time considering his dementia.  Will restart lower dose metoprolol; HR low at night and early AM. Will restart home spironolactone 25 mg daily.    Bradycardia: Noted prior. NSR today, no second degree AV block or CHB. 1st degree AV block.  Can continue to observe and if rates ok today can reconsider restarting metop at a lower  dose to help suppress his ectopy.   # Hypertension:  BP stable.  Hold home metop; holding home spironolactone.  # PAF: NSR. Amiodarone was stopped 2/2 hyperthryoidism.  Continue Eliquis.   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  BP control as above.  Resume beta blocker prior to discharge if HR allows.   # Encephalopathy: Treated for UTI.  No seizures per neurology.  Holding benzodiazepines.  Encourage CPAP use.  # Hyperthyroidism:  Per primary team. TSH <0.010. No significant tachyarrhythmia. He is HDS. On methimazole 10 mg TID.      With normal rhythm and euvolemia; cardiology will sign off.  For questions or updates, please contact [place] Please consult [email or web address] for contact info under        Signed, Janina Mayo, MD  05/15/2022, 9:34 AM

## 2022-05-15 NOTE — Progress Notes (Signed)
Occupational Therapy Treatment Patient Details Name: [name] MRN: [id number] DOB: 1946/07/10 Today's Date: 05/15/2022   History of present illness 76 y.o. male with medical history significant of PAF on Eliquis, HLD, DM2, chronic HFrEF, dementia. Presenting with altered mental status. Dx of hyperthyroid, metabolic encephalopathy.   OT comments  OT and RN planned to see patient together to see if patient could ambulate. Wife in room. Before even attempting therapy - no other staff in the room and preparing the room for patient to transfer. Patient began to get himself out of the bed to the right. Would not follow instructions to lay down so we could transfer to the left. Patient hauled himself to the edge of bed via bed rails at the foot of the bed. Therapist and spouse attempting to block him from trying to stand up as he was barefoot and the room not safely prepared. Patient grasped his wife's hand hard and would not leg go and swapped at therapist as she blocked him. Nursing staff in room immediately to assist.  Patient could not be reasoned with. Patient would not follow one command. Patient increasingly became argumentative and threatened hitting. With some swapping and grabbing of nurse and aggressive maneuvers. Despite multiple attempts at verbal instructions, calming, tactile cues patient could not be safely returned to bed. Ultimately required Haldol and restraints.He was complaining of chest pain but surrounded by nursing and MD when therapist left the room. Patient has not changed since evaluation in which he will not follow instructions. He appears to have good functional strength and theoretically able to perform functional tasks but is impaired by his altered mental status. Currently due to cognition - rehab potential is guarded.     Recommendations for follow up therapy are one component of a multi-disciplinary discharge planning process, led by the attending physician.   Recommendations may be updated based on patient status, additional functional criteria and insurance authorization.    Follow Up Recommendations  Skilled nursing-short term rehab (<3 hours/day)    Assistance Recommended at Discharge Frequent or constant Supervision/Assistance  Patient can return home with the following  Two people to help with walking and/or transfers;A lot of help with bathing/dressing/bathroom;Direct supervision/assist for financial management;Assistance with cooking/housework;Help with stairs or ramp for entrance;Direct supervision/assist for medications management   Equipment Recommendations  Other (comment) (TBD)    Recommendations for Other Services      Precautions / Restrictions Precautions Precautions: Fall Precaution Comments: can become combative and argumentative Restrictions Weight Bearing Restrictions: No       Mobility Bed Mobility Overal bed mobility: Needs Assistance Bed Mobility: Supine to Sit     Supine to sit: Supervision, HOB elevated     General bed mobility comments: able to pull himself up into sitting with bed rails. Unfortunately too unsafe to transfer due to altered mental status then became combative requiring total assist to return to supine.    Transfers                         Balance Overall balance assessment: Needs assistance Sitting-balance support: No upper extremity supported, Feet supported                                       ADL either performed or assessed with clinical judgement   ADL  Extremity/Trunk Assessment Upper Extremity Assessment Upper Extremity Assessment: Overall WFL for tasks assessed (Upper body strength is strong)       Cervical / Trunk Assessment Cervical / Trunk Assessment: Normal    Vision   Vision Assessment?: No apparent visual deficits   Perception     Praxis      Cognition  Arousal/Alertness: Awake/alert Behavior During Therapy: Agitated Overall Cognitive Status: Impaired/Different from baseline Area of Impairment: Orientation, Attention, Memory, Following commands, Safety/judgement, Awareness                 Orientation Level: Disoriented to, Time, Situation, Place Current Attention Level: Focused, Sustained Memory: Decreased recall of precautions, Decreased short-term memory Following Commands:  (does not follow any commands) Safety/Judgement: Decreased awareness of deficits, Decreased awareness of safety Awareness: Intellectual   General Comments: Alert to self. Can speak and participatein conversation but has no orientation or situational awareness. Cannot be reasoned with.        Exercises      Shoulder Instructions       General Comments      Pertinent Vitals/ Pain       Pain Assessment Pain Assessment: No/denies pain  Home Living                                          Prior Functioning/Environment              Frequency  Min 2X/week        Progress Toward Goals  OT Goals(current goals can now be found in the care plan section)  Progress towards OT goals: Not progressing toward goals - comment (continued altered mental status)  Acute Rehab OT Goals OT Goal Formulation: Patient unable to participate in goal setting Time For Goal Achievement: 05/26/22 Potential to Achieve Goals: Poor  Plan Discharge plan remains appropriate    Co-evaluation                 AM-PAC OT "6 Clicks" Daily Activity     Outcome Measure   Help from another person eating meals?: A Little Help from another person taking care of personal grooming?: A Little Help from another person toileting, which includes using toliet, bedpan, or urinal?: Total Help from another person bathing (including washing, rinsing, drying)?: A Lot Help from another person to put on and taking off regular upper body clothing?: A  Lot Help from another person to put on and taking off regular lower body clothing?: Total 6 Click Score: 12    End of Session    OT Visit Diagnosis: Other symptoms and signs involving cognitive function   Activity Tolerance Treatment limited secondary to agitation   Patient Left in bed;with nursing/sitter in room   Nurse Communication Mobility status        Time: 3646-8032 OT Time Calculation (min): 35 min  Charges: OT General Charges $OT Visit: 1 Visit OT Treatments $Therapeutic Activity: 8-22 mins  Derl Barrow, OTR/L [place]  Office [phone] Pager: [phone]   Lenward Chancellor 05/15/2022, 4:24 PM

## 2022-05-15 NOTE — Progress Notes (Signed)
PROGRESS NOTE    [name]  DZH:[id number] DOB: [birth year] DOA: 05/06/2022 PCP: [name], MD   Brief Narrative: 76 year old with past medical history significant for PAF on Eliquis, hyperlipidemia, diabetes type 2, chronic heart failure reduced ejection fraction 45%, dementia.  Patient presented with altered mental status, increased confusion over the last several days.  He has been having hallucination.  Patient was a started on Seroquel a couple of days prior to admission.  He has been following with his PCP for worsening dementia.  Evaluation in the ED GL hypothyroidism, with a TSH of 0.01, free T4 at 4.0, free T3 at 5.  He was started on methimazole and amiodarone was discontinued.  Patient had some SVT , 2D echo showed lower ejection fraction to 25 to 30%.  Global hypokinesis.  Cardiology has been consulted.  He has been confused, and agitated.  MRI was ordered 7/25th.  Patient received IV Ativan, he became more lethargic 7/26 after he received ativan  from prior night. ABG showed acute hypoxic, hypercapnic respiratory failure. He was transfer to ICU. CCM consulted. Patient was placed on supplemental oxygen. His mental status has improved, his respiratory failure has improved as well.   [facility] due to delirium, encephalopathy, acute respiratory failure.   Assessment & Plan:   Principal Problem:   Hyperthyroidism Active Problems:   Essential hypertension   HLD (hyperlipidemia)   DM2 (diabetes mellitus, type 2) (HCC)   Chronic HFrEF (heart failure with reduced ejection fraction) (HCC)   PAF (paroxysmal atrial fibrillation) (HCC)   Acute metabolic encephalopathy   Dementia with behavioral disturbance (HCC)   Stage 3a chronic kidney disease (CKD) (HCC)   Elevated troponin   Elevated d-dimer  1-Hyperthyroidism: -Patient presented with a TSH of 0.01, free T3 at 5, free T4 at 4. -Patient was a started on methimazole/  -plan to resume low dose metoprolol./   -Repeated free T3 : 3.6 and free T4 down to 3.1. -Started  patient on cholestyramine 4 g 4 times a day.    2-Acute Metabolic Encephalopathy -In the setting of hyperthyroidism -Hopefully with treating hyperthyroidism confusion will improve. -MRI ordered, he got agitated and unable to complete MRI.  He received Ativan -He became more  lethargic  after he received Ativan.  -ABG showed acute hypoxic , hypercapnic resp failure.  -Avoid benzos.  -Repeated Blood culture: No growth to date.  -Pro-clacitonin. Negative -Still having hallucination and confuse. More calm.  Discussed with neurology, patient  has delirium aggravated by hyperthyroidism. We could try Zyprexa to help with hallucination and sleep.    3-PAF: -Metoprolol held due to Bradycardia initially.   -Amiodarone discontinue due to hyperthyroidism.  -Cardiology consulted, follow input. 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  Cardiology consulted. Appreciate assistance.  Plan to resume spironolactone and metoprolol/   5-Acute Hypoxic Respiratory Failure;  Patient more lethargic 7/26.  Secondary to Ativan, hypoventilation. .  ABG showed PO2: 66, PCO2 49. Chest x [name]: pulmonary edema vs infiltrates. Received one dose of IV lasix 7/26.Marland Kitchen  CCM consulted. Recommended Oxygen supplementation, transfer to ICU.  Holding lasix due to hypernatremia On 2 L oxygen.   Diabetes type 2:  Hypoglycemia; due to poor oral intake. Monitor.  Continue with a sliding scale insulin  Dementia with behavioral disturbance.  Seroquel has been on  hold, he started to have hallucination after he was started on Seroquel. Delirium precaution.   Hyperlipidemia: Continue with Lipitor  Hypertension: spironolactone and Lasix on hold due to AKI.   Elevated D-dimer: Doppler negative for DVT Continue with Eliquis, he was already on Eliquis for PAF  Hypokalemia:Replaced.   Generalized weakness: PT , OT   AKI on CKD stage IIIa;  Cr Peal to 1.6 Has urine retention, foley catheter placed.  Hold diuretics.  Cr trending down.   Hypernatremia; Resolved.   30,000 staph in urine. Blood culture negative   Nutrition Problem: Increased nutrient needs Etiology: acute illness    Signs/Symptoms: estimated needs    Interventions: Ensure Enlive (each supplement provides 350kcal and 20 grams of protein), Liberalize Diet  Estimated body mass index is 32.72 kg/m as calculated from the following:   Height as of this encounter: '6\' 2"'$  (1.88 m).   Weight as of this encounter: 115.6 kg.   DVT prophylaxis: Eliquis Code Status: Full code Family Communication: Wife 7/30 Disposition Plan:  Status is: Inpatient Remains inpatient appropriate because:  SNF in 1 or 2 days.     Consultants:  Cardiology  Procedures:  ECHO  Antimicrobials:    Subjective: He was sleepy last night. He was more sleepy this am. As day went by he became more alert.   Objective: Vitals:   05/15/22 0400 05/15/22 0500 05/15/22 0800 05/15/22 1300  BP: (!) 155/90     Pulse: 88     Resp: (!) 25     Temp:   98.1 F (36.7 C) 97.7 F (36.5 C)  TempSrc:   Axillary Oral  SpO2: 99%     Weight:  115.6 kg    Height:        Intake/Output Summary (Last 24 hours) at 05/15/2022 1426 Last data filed at 05/15/2022 0258 Gross per 24 hour  Intake 240 ml  Output 615 ml  Net -375 ml    Filed Weights   05/13/22 0439 05/14/22 0500 05/15/22 0500  Weight: 118.5 kg 118.2 kg 115.6 kg    Examination:  General exam: NAD Respiratory system: CTA Cardiovascular system: S 1, s 2 RRR Gastrointestinal system: BS present, soft, nt Central nervous system: Alert Extremities: no edema, Hyperpigmentation Lower extremities.    Data Reviewed: I have personally reviewed  following labs and imaging studies  CBC: Recent Labs  Lab 05/09/22 0529 05/10/22 0843 05/11/22 0834 05/12/22 0302 05/13/22 0304 05/14/22 0249 05/15/22 0745  WBC 8.5 8.7 10.0 10.9* 11.1* 9.7 9.4  NEUTROABS 6.7 6.8  --   --   --   --   --   HGB 14.5 14.6 15.3 14.7 14.1 14.5 14.1  HCT 46.0 46.3 48.7 47.8 44.9 45.3 45.1  MCV 90.9 91.3 91.4 94.3 91.6 91.0 92.4  PLT 298 253 298 217 220 210 527    Basic Metabolic Panel: Recent Labs  Lab 05/11/22 1429 05/12/22 0302 05/13/22 0304 05/14/22 0249 05/14/22 2106 05/15/22 0745  NA  --  146* 148* 143 144 141  K  --  3.8 3.6 3.3* 3.5 3.7  CL  --  107 109 105 109 106  CO2  --  '27 29 27 27 27  '$ GLUCOSE  --  86 109* 107* 116* 104*  BUN  --  33* 31* '23 22 20  '$ CREATININE  --  1.63* 1.32* 1.08 1.06 1.02  CALCIUM  --  8.4* 8.8* 8.8* 8.9 8.6*  MG 2.1 2.1  --  2.0 2.3  --  PHOS  --  5.4*  --   --   --   --     GFR: Estimated Creatinine Clearance: 83.3 mL/min (by C-G formula based on SCr of 1.02 mg/dL). Liver Function Tests: Recent Labs  Lab 05/12/22 0302 05/13/22 0304  AST 17 17  ALT 16 15  ALKPHOS 56 61  BILITOT 0.7 0.9  PROT 5.7* 5.9*  ALBUMIN 2.9* 3.1*    No results for input(s): "LIPASE", "AMYLASE" in the last 168 hours. Recent Labs  Lab 05/14/22 2106  AMMONIA 25    Coagulation Profile: No results for input(s): "INR", "PROTIME" in the last 168 hours. Cardiac Enzymes: Recent Labs  Lab 05/12/22 0302  CKTOTAL 18*  CKMB 1.8    BNP (last 3 results) No results for input(s): "PROBNP" in the last 8760 hours. HbA1C: No results for input(s): "HGBA1C" in the last 72 hours. CBG: Recent Labs  Lab 05/14/22 1953 05/14/22 2348 05/15/22 0336 05/15/22 0741 05/15/22 1143  GLUCAP 137* 102* 109* 144* 96    Lipid Profile: No results for input(s): "CHOL", "HDL", "LDLCALC", "TRIG", "CHOLHDL", "LDLDIRECT" in the last 72 hours. Thyroid Function Tests: No results for input(s): "TSH", "T4TOTAL", "FREET4", "T3FREE",  "THYROIDAB" in the last 72 hours.  Anemia Panel: No results for input(s): "VITAMINB12", "FOLATE", "FERRITIN", "TIBC", "IRON", "RETICCTPCT" in the last 72 hours.  Sepsis Labs: Recent Labs  Lab 05/11/22 1805 05/12/22 0302 05/13/22 0304 05/14/22 0249  PROCALCITON  --  <0.10 <0.10 <0.10  LATICACIDVEN 1.5 1.6  --   --      Recent Results (from the past 240 hour(s))  Resp Panel by RT-PCR (Flu A&B, Covid) Anterior Nasal Swab     Status: None   Collection Time: 05/06/22 10:12 AM   Specimen: Anterior Nasal Swab  Result Value Ref Range Status   SARS Coronavirus 2 by RT PCR NEGATIVE NEGATIVE Final    Comment: (NOTE) SARS-CoV-2 target nucleic acids are NOT DETECTED.  The SARS-CoV-2 RNA is generally detectable in upper respiratory specimens during the acute phase of infection. The lowest concentration of SARS-CoV-2 viral copies this assay can detect is 138 copies/mL. A negative result does not preclude SARS-Cov-2 infection and should not be used as the sole basis for treatment or other patient management decisions. A negative result may occur with  improper specimen collection/handling, submission of specimen other than nasopharyngeal swab, presence of viral mutation(s) within the areas targeted by this assay, and inadequate number of viral copies(<138 copies/mL). A negative result must be combined with clinical observations, patient history, and epidemiological information. The expected result is Negative.  Fact Sheet for Patients:  EntrepreneurPulse.com.au  Fact Sheet for Healthcare Providers:  IncredibleEmployment.be  This test is no t yet approved or cleared by the [place] FDA and  has been authorized for detection and/or diagnosis of SARS-CoV-2 by FDA under an Emergency Use Authorization (EUA). This EUA will remain  in effect (meaning this test can be used) for the duration of the COVID-19 declaration under Section 564(b)(1) of the  Act, 21 U.S.C.section 360bbb-3(b)(1), unless the authorization is terminated  or revoked sooner.       Influenza A by PCR NEGATIVE NEGATIVE Final   Influenza B by PCR NEGATIVE NEGATIVE Final    Comment: (NOTE) The Xpert Xpress SARS-CoV-2/FLU/RSV plus assay is intended as an aid in the diagnosis of influenza from Nasopharyngeal swab specimens and should not be used as a sole basis for treatment. Nasal washings and aspirates are unacceptable for Xpert Xpress SARS-CoV-2/FLU/RSV testing.  Fact  Sheet for Patients: EntrepreneurPulse.com.au  Fact Sheet for Healthcare Providers: IncredibleEmployment.be  This test is not yet approved or cleared by the [place] FDA and has been authorized for detection and/or diagnosis of SARS-CoV-2 by FDA under an Emergency Use Authorization (EUA). This EUA will remain in effect (meaning this test can be used) for the duration of the COVID-19 declaration under Section 564(b)(1) of the Act, 21 U.S.C. section 360bbb-3(b)(1), unless the authorization is terminated or revoked.  Performed at [facility], [place] [address] [place], [place] [address]   Urine Culture     Status: Abnormal   Collection Time: 05/06/22 11:50 AM   Specimen: Urine, Catheterized  Result Value Ref Range Status   Specimen Description   Final    URINE, CATHETERIZED Performed at [place] [address], [place], [place] [address]    Special Requests   Final    NONE Performed at [facility], [place] [address], [place], [place] [address]    Culture 30,000 COLONIES/mL STAPHYLOCOCCUS SIMULANS (A)  Final   Report Status 05/08/2022 FINAL  Final   Organism ID, Bacteria STAPHYLOCOCCUS SIMULANS (A)  Final      Susceptibility   Staphylococcus simulans - MIC*    CIPROFLOXACIN <=0.5 SENSITIVE Sensitive     GENTAMICIN <=0.5 SENSITIVE Sensitive     NITROFURANTOIN <=16 SENSITIVE Sensitive      OXACILLIN <=0.25 SENSITIVE Sensitive     TETRACYCLINE <=1 SENSITIVE Sensitive     VANCOMYCIN <=0.5 SENSITIVE Sensitive     TRIMETH/SULFA <=10 SENSITIVE Sensitive     CLINDAMYCIN <=0.25 SENSITIVE Sensitive     RIFAMPIN <=0.5 SENSITIVE Sensitive     Inducible Clindamycin NEGATIVE Sensitive     * 30,000 COLONIES/mL STAPHYLOCOCCUS SIMULANS  Culture, blood (routine x 2)     Status: None   Collection Time: 05/06/22  1:07 PM   Specimen: BLOOD  Result Value Ref Range Status   Specimen Description   Final    BLOOD LEFT FOREARM Performed at [place] [address], [place], [place] [address]    Special Requests   Final    BOTTLES DRAWN AEROBIC AND ANAEROBIC [place] Performed at [place] [address] [place], [place] [address]    Culture   Final    NO GROWTH 5 DAYS Performed at [facility], [place] [address] [place] [address], [place], [place] [address]    Report Status 05/11/2022 FINAL  Final  Culture, blood (routine x 2)     Status: None   Collection Time: 05/06/22  5:15 PM   Specimen: BLOOD  Result Value Ref Range Status   Specimen Description   Final    BLOOD LEFT ANTECUBITAL Performed at [place] [address], [place], [place] [address]    Special Requests   Final    BOTTLES DRAWN AEROBIC ONLY Blood Culture adequate volume Performed at [place] [address], [place], [place] [address]    Culture   Final    NO GROWTH 5 DAYS Performed at [facility], [place] [address] [place],  [address]    Report Status 05/11/2022 FINAL  Final  MRSA Next Gen by PCR, Nasal     Status: Abnormal   Collection Time: 05/11/22  2:57 PM   Specimen: Nasal Mucosa; Nasal Swab  Result Value Ref Range Status   MRSA by PCR Next Gen DETECTED (A) NOT DETECTED Final    Comment: CRITICAL RESULT CALLED  TO, READ BACK BY AND VERIFIED WITH: BREN,S AT 2034 ON 05/11/22 BY VAZQUEZ,J (NOTE) The GeneXpert MRSA Assay (FDA  approved for NASAL specimens only), is one component of a comprehensive MRSA colonization surveillance program. It is not intended to diagnose MRSA infection nor to guide or monitor treatment for MRSA infections. Test performance is not FDA approved in patients less than 67 years old. Performed at [facility], [place] [address] [place], [place] [address]   Culture, blood (Routine X 2) w Reflex to ID Panel     Status: None (Preliminary result)   Collection Time: 05/11/22  3:13 PM   Specimen: BLOOD  Result Value Ref Range Status   Specimen Description   Final    BLOOD BLOOD LEFT FOREARM Performed at [place] [address] [place], [place] [address]    Special Requests   Final    IN PEDIATRIC BOTTLE Blood Culture adequate volume Performed at [place] [address], [place], [place] [address]    Culture   Final    NO GROWTH 4 DAYS Performed at [facility], [place] [address], [place], [place] [address]    Report Status PENDING  Incomplete  Culture, blood (Routine X 2) w Reflex to ID Panel     Status: None (Preliminary result)   Collection Time: 05/11/22  3:14 PM   Specimen: BLOOD  Result Value Ref Range Status   Specimen Description   Final    BLOOD BLOOD LEFT HAND Performed at [place] [address] [place] [address], [place], [place] [address]    Special Requests   Final    IN PEDIATRIC BOTTLE Blood Culture adequate volume Performed at [place] [address] [place], [place] [address]    Culture   Final    NO GROWTH 4 DAYS Performed at [facility], [place] [address] [place],  [address]    Report Status PENDING  Incomplete         Radiology Studies: No results found.      Scheduled Meds:  apixaban  5 mg Oral BID   atorvastatin  20 mg Oral Daily   calcium carbonate  1 tablet Oral TID WC   Chlorhexidine Gluconate Cloth  6 each Topical Daily    cholestyramine light  4 g Oral QID   feeding supplement  1 Container Oral Q24H   feeding supplement  237 mL Oral TID BM   FLUoxetine  40 mg Oral Daily   insulin aspart  0-15 Units Subcutaneous Q4H   lactulose  30 g Oral Daily   methimazole  10 mg Oral TID   metoprolol succinate  25 mg Oral Daily   multivitamin with minerals  1 tablet Oral Daily   mupirocin ointment  1 Application Nasal BID   spironolactone  25 mg Oral Daily   Continuous Infusions:   LOS: 9 days    Time spent: 35 minutes    Famous Eisenhardt A Maydelin Deming, MD [facility]   If 7PM-7AM, please contact night-coverage [email or web address]  05/15/2022, 2:26 PM

## 2022-05-15 NOTE — Progress Notes (Signed)
Called to room by OT due to patient becoming increasingly agitated and attempting to climb out of bed. Patient unable to be redirected or calmed down via verbal attempts. Regalado MD called to bedside. Verbal order for 1 mg IV Haldol to be administered. After 1 mg Haldol, patient calmed enough to where staff could get him placed back into bed. Shortly after, he began to have complaints of shortness of breath while MD was in room. SpO2 98% on 2 L [place]. Shortness of breath improved as patient calmed down.   About 30 minutes after dose of Haldol, patient became agitated and attempting to get out of bed and hit staff for second time. Regalado MD notified and order for 1 mg IV Haldol placed along with bilateral wrist restraints.   Zyprexa to be given at 8 PM  per [facility] MD.

## 2022-05-16 ENCOUNTER — Inpatient Hospital Stay (HOSPITAL_COMMUNITY): Payer: Medicare Other

## 2022-05-16 DIAGNOSIS — R531 Weakness: Secondary | ICD-10-CM | POA: Diagnosis not present

## 2022-05-16 DIAGNOSIS — Z515 Encounter for palliative care: Secondary | ICD-10-CM

## 2022-05-16 DIAGNOSIS — E0590 Thyrotoxicosis, unspecified without thyrotoxic crisis or storm: Secondary | ICD-10-CM | POA: Diagnosis not present

## 2022-05-16 DIAGNOSIS — Z7189 Other specified counseling: Secondary | ICD-10-CM

## 2022-05-16 LAB — CBC
HCT: 45.9 % (ref 39.0–52.0)
Hemoglobin: 14.4 g/dL (ref 13.0–17.0)
MCH: 28.6 pg (ref 26.0–34.0)
MCHC: 31.4 g/dL (ref 30.0–36.0)
MCV: 91.3 fL (ref 80.0–100.0)
Platelets: 199 10*3/uL (ref 150–400)
RBC: 5.03 MIL/uL (ref 4.22–5.81)
RDW: 15.6 % — ABNORMAL HIGH (ref 11.5–15.5)
WBC: 9.2 10*3/uL (ref 4.0–10.5)
nRBC: 0 % (ref 0.0–0.2)

## 2022-05-16 LAB — CULTURE, BLOOD (ROUTINE X 2)
Culture: NO GROWTH
Culture: NO GROWTH
Special Requests: ADEQUATE
Special Requests: ADEQUATE

## 2022-05-16 LAB — GLUCOSE, CAPILLARY
Glucose-Capillary: 113 mg/dL — ABNORMAL HIGH (ref 70–99)
Glucose-Capillary: 124 mg/dL — ABNORMAL HIGH (ref 70–99)
Glucose-Capillary: 119 mg/dL — ABNORMAL HIGH (ref 70–99)
Glucose-Capillary: 115 mg/dL — ABNORMAL HIGH (ref 70–99)
Glucose-Capillary: 116 mg/dL — ABNORMAL HIGH (ref 70–99)

## 2022-05-16 LAB — BASIC METABOLIC PANEL
Anion gap: 9 (ref 5–15)
BUN: 16 mg/dL (ref 8–23)
CO2: 23 mmol/L (ref 22–32)
Calcium: 9 mg/dL (ref 8.9–10.3)
Chloride: 110 mmol/L (ref 98–111)
Creatinine, Ser: 0.94 mg/dL (ref 0.61–1.24)
GFR, Estimated: >60 mL/min (ref >60)
Glucose, Bld: 112 mg/dL — ABNORMAL HIGH (ref 70–99)
Potassium: 3.9 mmol/L (ref 3.5–5.1)
Sodium: 142 mmol/L (ref 135–145)

## 2022-05-16 MED ORDER — OLANZAPINE 2.5 MG PO TABS
2.5000 mg | ORAL_TABLET | Freq: Every day | ORAL | Status: DC
  Administered 2022-05-16 – 2022-05-19 (×4): 2.5 mg via ORAL
  Filled 2022-05-16 (×4): qty 1

## 2022-05-16 MED ORDER — FUROSEMIDE 10 MG/ML IJ SOLN
20.0000 mg | Freq: Once | INTRAMUSCULAR | Status: AC
  Administered 2022-05-16: 20 mg via INTRAVENOUS
  Filled 2022-05-16: qty 2

## 2022-05-16 MED ORDER — SENNOSIDES-DOCUSATE SODIUM 8.6-50 MG PO TABS
1.0000 | ORAL_TABLET | Freq: Two times a day (BID) | ORAL | Status: DC
  Administered 2022-05-16 – 2022-05-23 (×13): 1 via ORAL
  Filled 2022-05-16 (×13): qty 1

## 2022-05-16 MED ORDER — TAMSULOSIN HCL 0.4 MG PO CAPS
0.4000 mg | ORAL_CAPSULE | Freq: Every day | ORAL | Status: DC
  Administered 2022-05-16 – 2022-05-23 (×8): 0.4 mg via ORAL
  Filled 2022-05-16 (×8): qty 1

## 2022-05-16 NOTE — Progress Notes (Signed)
Physical Therapy Treatment Patient Details Name: [name] MRN: [id number] DOB: April 23, 1946 Today's Date: 05/16/2022   History of Present Illness 75 y.o. male with medical history significant of PAF on Eliquis, HLD, DM2, chronic HFrEF, dementia. Presenting with altered mental status. Dx of hyperthyroid, metabolic encephalopathy.    PT Comments    RN reported to perform bed level TE's only due to safety/cognition.  Pt in bed with B wrist restraints.  General Comments: AxO x 2 able to follow commands, able to recall he was in the hospital.  When asked why he responded with "I'm sick in the head" and started laughing. Pt also able to perform bed level TE's following directions.  I did see pt in am as RN states "he SunDowns". Performed 10 reps AAROM HS, ABD, SLR, SAQ's  Pt will need ST Rehab at SNF to address mobility and functional decline prior to safely returning home.    Recommendations for follow up therapy are one component of a multi-disciplinary discharge planning process, led by the attending physician.  Recommendations may be updated based on patient status, additional functional criteria and insurance authorization.  Follow Up Recommendations  Skilled nursing-short term rehab (<3 hours/day) Can patient physically be transported by private vehicle: No   Assistance Recommended at Discharge Frequent or constant Supervision/Assistance  Patient can return home with the following Two people to help with walking and/or transfers;Two people to help with bathing/dressing/bathroom;Assistance with cooking/housework;Assist for transportation;Help with stairs or ramp for entrance   Equipment Recommendations  None recommended by PT    Recommendations for Other Services       Precautions / Restrictions Precautions Precautions: Fall Precaution Comments: Hx Dementia can become combative and argumentative.  Does SunDown. Restrictions Weight Bearing Restrictions: No     Mobility                    Balance                                            Cognition Arousal/Alertness: Awake/alert Behavior During Therapy: WFL for tasks assessed/performed                                   General Comments: AxO x 2 able to follow commands, able to recall he was in the hospital.  When asked why he responded with "I'm sick in the head" and started laughing. Pt also able to perform bed level TE's following directions.  I did see pt in am as RN states "he SunDowns".        Exercises      General Comments        Pertinent Vitals/Pain Pain Assessment Pain Assessment: No/denies pain    Home Living                          Prior Function            PT Goals (current goals can now be found in the care plan section)      Frequency    Min 2X/week      PT Plan Current plan remains appropriate    Co-evaluation              AM-PAC PT "6 Clicks" Mobility   Outcome Measure  Help needed turning from your back to your side while in a flat bed without using bedrails?: Total Help needed moving from lying on your back to sitting on the side of a flat bed without using bedrails?: Total Help needed moving to and from a bed to a chair (including a wheelchair)?: Total Help needed standing up from a chair using your arms (e.g., wheelchair or bedside chair)?: Total Help needed to walk in hospital room?: Total Help needed climbing 3-5 steps with a railing? : Total 6 Click Score: 6    End of Session     Patient left: in bed;with call bell/phone within reach;with restraints reapplied;Other (comment) (B wrist restraints) Nurse Communication: Mobility status PT Visit Diagnosis: Difficulty in walking, not elsewhere classified (R26.2)     Time: 8638-1771 PT Time Calculation (min) (ACUTE ONLY): 11 min  Charges:  $Therapeutic Exercise: 8-22 mins                     Rica Koyanagi  PTA [place] [facility] M-F          [phone] Weekend pager [phone]

## 2022-05-16 NOTE — Progress Notes (Signed)
Daily Progress Note   Patient Name: [name]       Date: 05/16/2022 DOB: [birth year]  Age: 76 y.o. MRN#: [id number] Attending Physician: Elmarie Shiley, MD Primary Care Physician: [name], MD Admit Date: 05/06/2022  Reason for Consultation/Follow-up: Establishing goals of care  Subjective: Awake this afternoon, wife at bedside.  He is able to answer a few questions appropriately now.  Length of Stay: 10  Current Medications: Scheduled Meds:   apixaban  5 mg Oral BID   atorvastatin  20 mg Oral Daily   calcium carbonate  1 tablet Oral TID WC   Chlorhexidine Gluconate Cloth  6 each Topical Daily   cholestyramine light  4 g Oral QID   feeding supplement  1 Container Oral Q24H   feeding supplement  237 mL Oral TID BM   FLUoxetine  40 mg Oral Daily   insulin aspart  0-15 Units Subcutaneous Q4H   lactulose  30 g Oral Daily   methimazole  10 mg Oral TID   metoprolol succinate  25 mg Oral Daily   multivitamin with minerals  1 tablet Oral Daily   OLANZapine  2.5 mg Oral Daily   spironolactone  25 mg Oral Daily   tamsulosin  0.4 mg Oral Daily    Continuous Infusions:   PRN Meds: acetaminophen **OR** acetaminophen, OLANZapine, mouth rinse, mouth rinse  Physical Exam         Awake alert Resting in bed Stasis changes bilateral lower extremities Regular work of breathing Monitor noted Abdomen is not distended  Vital Signs: BP 121/89 (BP Location: Left Arm)   Pulse (!) 106   Temp 98 F (36.7 C) (Oral)   Resp 19   Ht '6\' 2"'$  (1.88 m)   Wt 115.6 kg   SpO2 94%   BMI 32.72 kg/m  SpO2: SpO2: 94 % O2 Device: O2 Device: Nasal Cannula O2 Flow Rate: O2 Flow Rate (L/min): 2 L/min  Intake/output summary:  Intake/Output Summary (Last 24 hours) at 05/16/2022  1308 Last data filed at 05/16/2022 1200 Gross per 24 hour  Intake --  Output 700 ml  Net -700 ml   LBM: Last BM Date : 05/06/22 Baseline Weight: Weight: 120.7 kg Most recent weight: Weight: 115.6 kg       Palliative Assessment/Data:  Patient Active Problem List   Diagnosis Date Noted   Hyperthyroidism 04/27/1974   Acute metabolic encephalopathy 88/32/5498   Dementia with behavioral disturbance ([place]) 05/06/2022   Stage 3a chronic kidney disease (CKD) ([place]) 05/06/2022   Elevated troponin 05/06/2022   Elevated d-dimer 05/06/2022   Positive colorectal cancer screening using Cologuard test 11/02/2021   Chronic anticoagulation 11/02/2021   Cellulitis of right lower leg 10/07/2019   Chronic edema 05/17/2019   PAF (paroxysmal atrial fibrillation) ([place]) 04/10/2019   Chronic HFrEF (heart failure with reduced ejection fraction) ([place]) 04/08/2019   Altered mental status 04/07/2019   Psoriasis of scalp 08/15/2018   Anxiety, generalized 01/02/2017   DM2 (diabetes mellitus, type 2) ([place]) 11/25/2014   NICM (nonischemic cardiomyopathy) ([place]) 09/09/2013   HLD (hyperlipidemia) 09/09/2013   OSA (obstructive sleep apnea) 26/41/5830   Acute systolic CHF (congestive heart failure), NYHA class 2 ([place]) 08/27/2013   LEG EDEMA 09/14/2009   Lymphoma, non-Hodgkin's ([place]) 09/24/2008   VERTIGO 09/24/2008   ABDOMINAL MASS 10/26/2007   Essential hypertension 10/23/2007   NEPHROLITHIASIS 10/23/2007   HEADACHE 06/07/2007    Palliative Care Assessment & Plan   Patient Profile:    Assessment: 76 year old gentleman with paroxysmal atrial fibrillation, diabetes dyslipidemia chronic heart failure with reduced ejection fraction and history of dementia.  Patient currently admitted to hospital medicine service with altered mental status worsening confusion, remains admitted with cardiology colleagues following for hyperthyroidism, paroxysmal atrial fibrillation, chronic combined systolic and diastolic  heart failure with now worsening cardiomyopathy ejection fraction 25-30% as well as episodic acute metabolic encephalopathy. Palliative medicine consulted for CODE STATUS and broad goals of care discussions.  Recommendations/Plan: CODE STATUS and goals of care discussions undertaken again with patient and wife was also present at the bedside.  She states that they have had advance care directives done in the past, she has had a chance to review them.  She states they both have living wills stating DO NOT RESUSCITATE.  We spent some time today discussing about differences between full code versus DNR/DNI.  Patient and wife endorse that their wishes are for DNR/DNI.  We will change his CODE STATUS to reflect wishes of DNR/DNI.  Note that physical therapy has made recommendations for skilled nursing facility rehabilitation attempt.  Will recommend that palliative services follow the patient at [facility] rehab.  Continue current medical care for now.  No further PMT specific interventions or recommendations at this time    Code Status:    Code Status Orders  (From admission, onward)           Start     Ordered   05/16/22 1309  Do not attempt resuscitation (DNR)  Continuous       Question Answer Comment  In the event of cardiac or respiratory ARREST Do not call a "code blue"   In the event of cardiac or respiratory ARREST Do not perform Intubation, CPR, defibrillation or ACLS   In the event of cardiac or respiratory ARREST Use medication by any route, position, wound care, and other measures to relive pain and suffering. May use oxygen, suction and manual treatment of airway obstruction as needed for comfort.      05/16/22 1308           Code Status History     Date Active Date Inactive Code Status Order ID Comments User Context   05/06/2022 1630 05/16/2022 1308 Full Code [id number]  Jonnie Finner, DO ED   04/07/2019 0438 04/08/2019 2048 Full Code [id number]  Harvie Bridge, [place] Inpatient    08/27/2013 1253 08/31/2013 1721 Full Code [id number]  Kelvin Cellar, MD Inpatient      Advance Directive Documentation    Flowsheet Row Most Recent Value  Type of Advance Directive Healthcare Power of Attorney, Living will  Pre-existing out of facility DNR order (yellow form or pink MOST form) --  "MOST" Form in Place? --       Prognosis:  Guarded   Discharge Planning: [facility] for rehab with Palliative care service follow-up  Care plan was discussed with patient and wife  Thank you for allowing the Palliative Medicine Team to assist in the care of this patient.  MOD MDM.      Greater than 50%  of this time was spent counseling and coordinating care related to the above assessment and plan.  Loistine Chance, MD  Please contact Palliative Medicine Team phone at [phone] for questions and concerns.

## 2022-05-16 NOTE — Progress Notes (Signed)
PROGRESS NOTE    [name]  QZR:[id number] DOB: 11-Jul-1946 DOA: 05/06/2022 PCP: [name], MD   Brief Narrative: 76 year old with past medical history significant for PAF on Eliquis, hyperlipidemia, diabetes type 2, chronic heart failure reduced ejection fraction 45%, dementia.  Patient presented with altered mental status, increased confusion over the last several days.  He has been having hallucination.  Patient was a started on Seroquel a couple of days prior to admission.  He has been following with his PCP for worsening dementia.  Evaluation in the ED GL hypothyroidism, with a TSH of 0.01, free T4 at 4.0, free T3 at 5.  He was started on methimazole and amiodarone was discontinued.  Patient had some SVT , 2D echo showed lower ejection fraction to 25 to 30%.  Global hypokinesis.  Cardiology has been consulted.  He has been confused, and agitated.  MRI was ordered 7/25th.  Patient received IV Ativan, he became more lethargic 7/26 after he received ativan  from prior night. ABG showed acute hypoxic, hypercapnic respiratory failure. He was transfer to ICU. CCM consulted. Patient was placed on supplemental oxygen. His mental status has improved, his respiratory failure has improved as well.   [name] stay due to delirium, encephalopathy, acute respiratory failure.   Assessment & Plan:   Principal Problem:   Hyperthyroidism Active Problems:   Essential hypertension   HLD (hyperlipidemia)   DM2 (diabetes mellitus, type 2) (HCC)   Chronic HFrEF (heart failure with reduced ejection fraction) (HCC)   PAF (paroxysmal atrial fibrillation) (HCC)   Acute metabolic encephalopathy   Dementia with behavioral disturbance (HCC)   Stage 3a chronic kidney disease (CKD) (HCC)   Elevated troponin   Elevated d-dimer  1-Hyperthyroidism: -Patient presented with a TSH of 0.01, free T3 at 5, free T4 at 4. -Patient was a started on methimazole/  -plan to resume low dose metoprolol./   -Repeated free T3 : 3.6 and free T4 down to 3.1. -Continue with cholestyramine 4 g 4 times a day.    2-Acute Metabolic Encephalopathy -In the setting of hyperthyroidism, delirium.  -Hopefully with treating hyperthyroidism confusion will improve over time.  -MRI ordered, he got agitated and unable to complete MRI. -He became more  lethargic  after he received Ativan.  -ABG showed acute hypoxic , hypercapnic resp failure.  -Avoid benzos.  -Repeated Blood culture: No growth to date.  -Pro-clacitonin. Negative Discussed with neurology, patient  has delirium aggravated by hyperthyroidism. We could try Zyprexa to help with hallucination and sleep.  He has been more agitated over last two days. Received haldol yesterday./ plan to schedule Zyprexa. PRN for agitation as well.    3-PAF: -Metoprolol held due to Bradycardia initially.   -Amiodarone discontinue due to hyperthyroidism.  -Cardiology consulted, follow input. Started low dose metoprolol/   4-Acute on Chronic combined systolic and diastolic heart failure New worsening cardiomyopathy, echo with ejection fraction down to 25 to 30% On Lasix and spironolactone, currently on hold due to worsening renal function.  Cardiology consulted. Appreciate assistance.  Back on spironolactone and metoprolol/  Report dyspnea. Chest x ray with pulmonary edema, improved from before. Will give one time low dose lasix.   5-Acute Hypoxic Respiratory Failure;  Patient more lethargic 7/26.  Secondary to Ativan, hypoventilation. .  ABG showed PO2: 66, PCO2 49. Chest x ray: pulmonary edema vs infiltrates. Received one dose of IV lasix 7/26.Marland Kitchen  CCM consulted. Recommended Oxygen supplementation, transfer to ICU.  On 2 L oxygen.  One  time dose lasix today.   Diabetes type 2:  Hypoglycemia; due to poor oral intake. Monitor.  Continue with a sliding scale insulin  Dementia with behavioral disturbance.  Seroquel has been on hold, he started to have  hallucination after he was started on Seroquel. Delirium precaution.   Hyperlipidemia: Continue with Lipitor  Hypertension: spironolactone and Lasix on hold due to AKI.  Elevated D-dimer: Doppler negative for DVT Continue with Eliquis, he was already on Eliquis for PAF  Hypokalemia:Replaced.   Generalized weakness: PT , OT   AKI on CKD stage IIIa;  Cr Peal to 1.6 Has urine retention, foley catheter placed.  Hold diuretics.  Cr trending down.   Urine retention; foley catheter placed.  Started on Flomax.   Hypernatremia; Resolved.   30,000 staph in urine. Blood culture negative   Nutrition Problem: Increased nutrient needs Etiology: acute illness    Signs/Symptoms: estimated needs    Interventions: Ensure Enlive (each supplement provides 350kcal and 20 grams of protein), Liberalize Diet  Estimated body mass index is 32.72 kg/m as calculated from the following:   Height as of this encounter: '6\' 2"'$  (1.88 m).   Weight as of this encounter: 115.6 kg.   DVT prophylaxis: Eliquis Code Status: Full code Family Communication: Wife 7/30 Disposition Plan:  Status is: Inpatient Remains inpatient appropriate because:  SNF in 1 or 2 days.     Consultants:  Cardiology  Procedures:  ECHO  Antimicrobials:    Subjective: He is alert, confuse. Continue  to try to get out of bed, confuse.   Objective: Vitals:   05/16/22 0600 05/16/22 0615 05/16/22 0700 05/16/22 0800  BP:  (!) 150/96  (!) 160/112  Pulse: 83 95 83 (!) 107  Resp:  15 (!) 26 20  Temp:    97.9 F (36.6 C)  TempSrc:    Oral  SpO2: 94% 97% 92% 90%  Weight:      Height:        Intake/Output Summary (Last 24 hours) at 05/16/2022 1275 Last data filed at 05/15/2022 1614 Gross per 24 hour  Intake --  Output 200 ml  Net -200 ml    Filed Weights   05/14/22 0500 05/15/22 0500 05/16/22 0500  Weight: 118.2 kg 115.6 kg 115.6 kg    Examination:  General exam: NAD Respiratory system:  CTA Cardiovascular system: S 1, S 2 RRR Gastrointestinal system: BS present, soft, nt Central nervous system: Alert.  Extremities: no edema, Hyperpigmentation Lower extremities.    Data Reviewed: I have personally reviewed following labs and imaging studies  CBC: Recent Labs  Lab 05/10/22 0843 05/11/22 0834 05/12/22 0302 05/13/22 0304 05/14/22 0249 05/15/22 0745 05/16/22 0739  WBC 8.7   < > 10.9* 11.1* 9.7 9.4 9.2  NEUTROABS 6.8  --   --   --   --   --   --   HGB 14.6   < > 14.7 14.1 14.5 14.1 14.4  HCT 46.3   < > 47.8 44.9 45.3 45.1 45.9  MCV 91.3   < > 94.3 91.6 91.0 92.4 91.3  PLT 253   < > 217 220 210 186 199   < > = values in this interval not displayed.    Basic Metabolic Panel: Recent Labs  Lab 05/11/22 1429 05/12/22 0302 05/13/22 0304 05/14/22 0249 05/14/22 2106 05/15/22 0745 05/16/22 0739  NA  --  146* 148* 143 144 141 142  K  --  3.8 3.6 3.3* 3.5 3.7 3.9  CL  --  107 109 105 109 106 110  CO2  --  '27 29 27 27 27 23  '$ GLUCOSE  --  86 109* 107* 116* 104* 112*  BUN  --  33* 31* '23 22 20 16  '$ CREATININE  --  1.63* 1.32* 1.08 1.06 1.02 0.94  CALCIUM  --  8.4* 8.8* 8.8* 8.9 8.6* 9.0  MG 2.1 2.1  --  2.0 2.3  --   --   PHOS  --  5.4*  --   --   --   --   --     GFR: Estimated Creatinine Clearance: 90.4 mL/min (by C-G formula based on SCr of 0.94 mg/dL). Liver Function Tests: Recent Labs  Lab 05/12/22 0302 05/13/22 0304  AST 17 17  ALT 16 15  ALKPHOS 56 61  BILITOT 0.7 0.9  PROT 5.7* 5.9*  ALBUMIN 2.9* 3.1*    No results for input(s): "LIPASE", "AMYLASE" in the last 168 hours. Recent Labs  Lab 05/14/22 2106  AMMONIA 25    Coagulation Profile: No results for input(s): "INR", "PROTIME" in the last 168 hours. Cardiac Enzymes: Recent Labs  Lab 05/12/22 0302  CKTOTAL 18*  CKMB 1.8    BNP (last 3 results) No results for input(s): "PROBNP" in the last 8760 hours. HbA1C: No results for input(s): "HGBA1C" in the last 72 hours. CBG: Recent  Labs  Lab 05/15/22 1143 05/15/22 1711 05/15/22 1911 05/16/22 0346 05/16/22 0747  GLUCAP 96 116* 125* 113* 124*    Lipid Profile: No results for input(s): "CHOL", "HDL", "LDLCALC", "TRIG", "CHOLHDL", "LDLDIRECT" in the last 72 hours. Thyroid Function Tests: No results for input(s): "TSH", "T4TOTAL", "FREET4", "T3FREE", "THYROIDAB" in the last 72 hours.  Anemia Panel: No results for input(s): "VITAMINB12", "FOLATE", "FERRITIN", "TIBC", "IRON", "RETICCTPCT" in the last 72 hours.  Sepsis Labs: Recent Labs  Lab 05/11/22 1805 05/12/22 0302 05/13/22 0304 05/14/22 0249  PROCALCITON  --  <0.10 <0.10 <0.10  LATICACIDVEN 1.5 1.6  --   --      Recent Results (from the past 240 hour(s))  Resp Panel by RT-PCR (Flu A&B, Covid) Anterior Nasal Swab     Status: None   Collection Time: 05/06/22 10:12 AM   Specimen: Anterior Nasal Swab  Result Value Ref Range Status   SARS Coronavirus 2 by RT PCR NEGATIVE NEGATIVE Final    Comment: (NOTE) SARS-CoV-2 target nucleic acids are NOT DETECTED.  The SARS-CoV-2 RNA is generally detectable in upper respiratory specimens during the acute phase of infection. The lowest concentration of SARS-CoV-2 viral copies this assay can detect is 138 copies/mL. A negative result does not preclude SARS-Cov-2 infection and should not be used as the sole basis for treatment or other patient management decisions. A negative result may occur with  improper specimen collection/handling, submission of specimen other than nasopharyngeal swab, presence of viral mutation(s) within the areas targeted by this assay, and inadequate number of viral copies(<138 copies/mL). A negative result must be combined with clinical observations, patient history, and epidemiological information. The expected result is Negative.  Fact Sheet for Patients:  EntrepreneurPulse.com.au  Fact Sheet for Healthcare Providers:   IncredibleEmployment.be  This test is no t yet approved or cleared by the [place] FDA and  has been authorized for detection and/or diagnosis of SARS-CoV-2 by FDA under an Emergency Use Authorization (EUA). This EUA will remain  in effect (meaning this test can be used) for the duration of the COVID-19 declaration under Section 564(b)(1) of the Act, 21 U.S.C.section 360bbb-3(b)(1),  unless the authorization is terminated  or revoked sooner.       Influenza A by PCR NEGATIVE NEGATIVE Final   Influenza B by PCR NEGATIVE NEGATIVE Final    Comment: (NOTE) The Xpert Xpress SARS-CoV-2/FLU/RSV plus assay is intended as an aid in the diagnosis of influenza from Nasopharyngeal swab specimens and should not be used as a sole basis for treatment. Nasal washings and aspirates are unacceptable for Xpert Xpress SARS-CoV-2/FLU/RSV testing.  Fact Sheet for Patients: EntrepreneurPulse.com.au  Fact Sheet for Healthcare Providers: IncredibleEmployment.be  This test is not yet approved or cleared by the [place] FDA and has been authorized for detection and/or diagnosis of SARS-CoV-2 by FDA under an Emergency Use Authorization (EUA). This EUA will remain in effect (meaning this test can be used) for the duration of the COVID-19 declaration under Section 564(b)(1) of the Act, 21 U.S.C. section 360bbb-3(b)(1), unless the authorization is terminated or revoked.  Performed at [facility], [place] [address], [place], [place] [address]   Urine Culture     Status: Abnormal   Collection Time: 05/06/22 11:50 AM   Specimen: Urine, Catheterized  Result Value Ref Range Status   Specimen Description   Final    URINE, CATHETERIZED Performed at [facility] [address] [place], [place] [address]    Special Requests   Final    NONE Performed at [facility], [place] [address],  [place], [place] [address]    Culture 30,000 COLONIES/mL STAPHYLOCOCCUS SIMULANS (A)  Final   Report Status 05/08/2022 FINAL  Final   Organism ID, Bacteria STAPHYLOCOCCUS SIMULANS (A)  Final      Susceptibility   Staphylococcus simulans - MIC*    CIPROFLOXACIN <=0.5 SENSITIVE Sensitive     GENTAMICIN <=0.5 SENSITIVE Sensitive     NITROFURANTOIN <=16 SENSITIVE Sensitive     OXACILLIN <=0.25 SENSITIVE Sensitive     TETRACYCLINE <=1 SENSITIVE Sensitive     VANCOMYCIN <=0.5 SENSITIVE Sensitive     TRIMETH/SULFA <=10 SENSITIVE Sensitive     CLINDAMYCIN <=0.25 SENSITIVE Sensitive     RIFAMPIN <=0.5 SENSITIVE Sensitive     Inducible Clindamycin NEGATIVE Sensitive     * 30,000 COLONIES/mL STAPHYLOCOCCUS SIMULANS  Culture, blood (routine x 2)     Status: None   Collection Time: 05/06/22  1:07 PM   Specimen: BLOOD  Result Value Ref Range Status   Specimen Description   Final    BLOOD LEFT FOREARM Performed at [facility] [address] [place], [place] [address]    Special Requests   Final    BOTTLES DRAWN AEROBIC AND ANAEROBIC [place] Performed at [facility] [address], [place], [place] [address]    Culture   Final    NO GROWTH 5 DAYS Performed at [facility], [place] [address], [place], [place] [address]    Report Status 05/11/2022 FINAL  Final  Culture, blood (routine x 2)     Status: None   Collection Time: 05/06/22  5:15 PM   Specimen: BLOOD  Result Value Ref Range Status   Specimen Description   Final    BLOOD LEFT ANTECUBITAL Performed at [facility] [address], [place], [place] [address]    Special Requests   Final    BOTTLES DRAWN AEROBIC ONLY Blood Culture adequate volume Performed at [facility] [address], [place], [place] [address]    Culture   Final  NO GROWTH 5 DAYS Performed at [facility], [place] [address] [place], [place] [address]    Report Status 05/11/2022  FINAL  Final  MRSA Next Gen by PCR, Nasal     Status: Abnormal   Collection Time: 05/11/22  2:57 PM   Specimen: Nasal Mucosa; Nasal Swab  Result Value Ref Range Status   MRSA by PCR Next Gen DETECTED (A) NOT DETECTED Final    Comment: CRITICAL RESULT CALLED TO, READ BACK BY AND VERIFIED WITH: BREN,S AT 2034 ON 05/11/22 BY VAZQUEZ,J (NOTE) The GeneXpert MRSA Assay (FDA approved for NASAL specimens only), is one component of a comprehensive MRSA colonization surveillance program. It is not intended to diagnose MRSA infection nor to guide or monitor treatment for MRSA infections. Test performance is not FDA approved in patients less than 28 years old. Performed at [facility], [place] [address] [place], [place] [address]   Culture, blood (Routine X 2) w Reflex to ID Panel     Status: None (Preliminary result)   Collection Time: 05/11/22  3:13 PM   Specimen: BLOOD  Result Value Ref Range Status   Specimen Description   Final    BLOOD BLOOD LEFT FOREARM Performed at [facility] [address], [place], [place] [address]    Special Requests   Final    IN PEDIATRIC BOTTLE Blood Culture adequate volume Performed at [facility] [address], [place], [place] [address]    Culture   Final    NO GROWTH 4 DAYS Performed at [facility], [place] [address], [place], [place] [address]    Report Status PENDING  Incomplete  Culture, blood (Routine X 2) w Reflex to ID Panel     Status: None (Preliminary result)   Collection Time: 05/11/22  3:14 PM   Specimen: BLOOD  Result Value Ref Range Status   Specimen Description   Final    BLOOD BLOOD LEFT HAND Performed at [facility] [address], [place], [place] [address]    Special Requests   Final    IN PEDIATRIC BOTTLE Blood Culture adequate volume Performed at [facility] [address] [place], [place] [address]    Culture   Final     NO GROWTH 4 DAYS Performed at [facility], [place] [address], [place], [place] [address]    Report Status PENDING  Incomplete         Radiology Studies: No results found.      Scheduled Meds:  apixaban  5 mg Oral BID   atorvastatin  20 mg Oral Daily   calcium carbonate  1 tablet Oral TID WC   Chlorhexidine Gluconate Cloth  6 each Topical Daily   cholestyramine light  4 g Oral QID   feeding supplement  1 Container Oral Q24H   feeding supplement  237 mL Oral TID BM   FLUoxetine  40 mg Oral Daily   insulin aspart  0-15 Units Subcutaneous Q4H   lactulose  30 g Oral Daily   methimazole  10 mg Oral TID   metoprolol succinate  25 mg Oral Daily   multivitamin with minerals  1 tablet Oral Daily   mupirocin ointment  1 Application Nasal BID   OLANZapine  2.5 mg Oral Daily   spironolactone  25 mg Oral Daily   Continuous Infusions:   LOS: 10 days    Time spent: 35 minutes    Dahiana Kulak  Desiree Lucy, MD [facility]   If 7PM-7AM, please contact night-coverage [email or web address]  05/16/2022, 9:09 AM

## 2022-05-16 NOTE — Plan of Care (Signed)
  Problem: Nutrition: Goal: Adequate nutrition will be maintained Outcome: Progressing   Problem: Pain Managment: Goal: General experience of comfort will improve Outcome: Progressing   

## 2022-05-17 DIAGNOSIS — E0590 Thyrotoxicosis, unspecified without thyrotoxic crisis or storm: Secondary | ICD-10-CM | POA: Diagnosis not present

## 2022-05-17 LAB — CBC
HCT: 44.3 % (ref 39.0–52.0)
Hemoglobin: 14.1 g/dL (ref 13.0–17.0)
MCH: 28.8 pg (ref 26.0–34.0)
MCHC: 31.8 g/dL (ref 30.0–36.0)
MCV: 90.6 fL (ref 80.0–100.0)
Platelets: 204 10*3/uL (ref 150–400)
RBC: 4.89 MIL/uL (ref 4.22–5.81)
RDW: 15.4 % (ref 11.5–15.5)
WBC: 9.5 10*3/uL (ref 4.0–10.5)
nRBC: 0 % (ref 0.0–0.2)

## 2022-05-17 LAB — BASIC METABOLIC PANEL
Anion gap: 11 (ref 5–15)
BUN: 20 mg/dL (ref 8–23)
CO2: 24 mmol/L (ref 22–32)
Calcium: 8.9 mg/dL (ref 8.9–10.3)
Chloride: 108 mmol/L (ref 98–111)
Creatinine, Ser: 1.04 mg/dL (ref 0.61–1.24)
GFR, Estimated: >60 mL/min (ref >60)
Glucose, Bld: 114 mg/dL — ABNORMAL HIGH (ref 70–99)
Potassium: 3.8 mmol/L (ref 3.5–5.1)
Sodium: 143 mmol/L (ref 135–145)

## 2022-05-17 LAB — GLUCOSE, CAPILLARY
Glucose-Capillary: 101 mg/dL — ABNORMAL HIGH (ref 70–99)
Glucose-Capillary: 106 mg/dL — ABNORMAL HIGH (ref 70–99)
Glucose-Capillary: 106 mg/dL — ABNORMAL HIGH (ref 70–99)
Glucose-Capillary: 109 mg/dL — ABNORMAL HIGH (ref 70–99)
Glucose-Capillary: 113 mg/dL — ABNORMAL HIGH (ref 70–99)
Glucose-Capillary: 126 mg/dL — ABNORMAL HIGH (ref 70–99)
Glucose-Capillary: 98 mg/dL (ref 70–99)

## 2022-05-17 MED ORDER — NICOTINE 14 MG/24HR TD PT24
14.0000 mg | MEDICATED_PATCH | Freq: Every day | TRANSDERMAL | Status: DC
  Administered 2022-05-17 – 2022-05-23 (×7): 14 mg via TRANSDERMAL
  Filled 2022-05-17 (×7): qty 1

## 2022-05-17 NOTE — Progress Notes (Signed)
PROGRESS NOTE    [name]  WUX:[id number] DOB: [birth year] DOA: 05/06/2022 PCP: [name], MD   Brief Narrative: 76 year old with past medical history significant for PAF on Eliquis, hyperlipidemia, diabetes type 2, chronic heart failure reduced ejection fraction 45%, dementia.  Patient presented with altered mental status, increased confusion over the last several days.  He has been having hallucination.  Patient was a started on Seroquel a couple of days prior to admission.  He has been following with his PCP for worsening dementia.  Evaluation in the ED GL hypothyroidism, with a TSH of 0.01, free T4 at 4.0, free T3 at 5.  He was started on methimazole and amiodarone was discontinued.  Patient had some SVT , 2D echo showed lower ejection fraction to 25 to 30%.  Global hypokinesis.  Cardiology has been consulted.  He has been confused, and agitated.  MRI was ordered 7/25th.  Patient received IV Ativan, he became more lethargic 7/26 after he received ativan  from prior night. ABG showed acute hypoxic, hypercapnic respiratory failure. He was transfer to ICU. CCM consulted. Patient was placed on supplemental oxygen. His mental status  improved, his respiratory failure  improved.  [facility] stay due to delirium, encephalopathy, acute respiratory failure.   8/01, he is more lethargic today, suspect now with hypoactive delirium and or related to Zyprexa. He was placed on Oxygen. He would say few words. Discussed with nursing staff if he is lethargic tonight plan to hold Zyprexa.  Patient has not been eating. Discussed with Wife, will see how he does next several day, if no improvement might need palliative care consultation again.   Assessment & Plan:   Principal Problem:   Hyperthyroidism Active Problems:   Essential hypertension   HLD (hyperlipidemia)   DM2 (diabetes mellitus, type 2) (HCC)   Chronic HFrEF (heart failure with reduced ejection fraction) (HCC)   PAF (paroxysmal  atrial fibrillation) (HCC)   Acute metabolic encephalopathy   Dementia with behavioral disturbance (HCC)   Stage 3a chronic kidney disease (CKD) (HCC)   Elevated troponin   Elevated d-dimer  1-Hyperthyroidism: -Patient presented with a TSH of 0.01, free T3 at 5, free T4 at 4. -Patient was a started on methimazole/  -On  low dose metoprolol./  -Repeated free T3 : 3.6 and free T4 down to 3.1. -Continue with cholestyramine 4 g 4 times a day.    2-Acute Metabolic Encephalopathy -In the setting of hyperthyroidism, delirium.  -Hopefully with treating hyperthyroidism confusion will improve over time.  -MRI ordered, he got agitated and unable to complete MRI. -He became more  lethargic  after he received Ativan.  -ABG showed acute hypoxic , hypercapnic resp failure.  -Avoid benzos.  -Repeated Blood culture: No growth to date.  -Pro-clacitonin. Negative -Discussed with neurology, patient  has delirium aggravated by hyperthyroidism. We could try Zyprexa to help with hallucination and sleep.  -He was more agitated on 7/30 and 7/31.  Received haldol./ he was started on  schedule Zyprexa. PRN for agitation as well.  8/01; he is more sleepy today, he says few words, ask for cigaret.  If lethargic tonight plan to hold Zyprexa. Suspect hypoactive delirium vs meds.   3-PAF: -Metoprolol held due to Bradycardia initially.   -Amiodarone discontinue due to hyperthyroidism.  -Cardiology consulted. 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  Cardiology consulted. Appreciate assistance.  Back on spironolactone and metoprolol/  Report dyspnea. Chest x ray with pulmonary edema, improved from before. Received  one time low dose lasix 7/31.   5-Acute Hypoxic Respiratory Failure;  Patient more  lethargic 7/26.  Secondary to Ativan, hypoventilation. .  ABG showed PO2: 66, PCO2 49. Chest x ray: pulmonary edema vs infiltrates. Received one dose of IV lasix 7/26.Marland Kitchen  CCM consulted. Recommended Oxygen supplementation, transfer to ICU.  Continue On 2 L oxygen.    Diabetes type 2:  Hypoglycemia; due to poor oral intake. Monitor.  Continue with a sliding scale insulin  Dementia with behavioral disturbance.  Seroquel has been on hold, he started to have hallucination after he was started on Seroquel. Delirium precaution.   Hyperlipidemia: Continue with Lipitor  Hypertension: spironolactone and Lasix on hold due to AKI.  Elevated D-dimer: Doppler negative for DVT Continue with Eliquis, he was already on Eliquis for PAF  Hypokalemia:Replaced.   Generalized weakness: PT , OT   AKI on CKD stage IIIa;  Cr Peal to 1.6 Has urine retention, foley catheter placed.  Hold diuretics.  Cr trending down.   Urine retention; foley catheter placed.  Started on Flomax.   Hypernatremia; Resolved.   30,000 staph in urine. Blood culture negative   Nutrition Problem: Increased nutrient needs Etiology: acute illness    Signs/Symptoms: estimated needs    Interventions: Ensure Enlive (each supplement provides 350kcal and 20 grams of protein), Liberalize Diet  Estimated body mass index is 32.72 kg/m as calculated from the following:   Height as of this encounter: '6\' 2"'$  (1.88 m).   Weight as of this encounter: 115.6 kg.   DVT prophylaxis: Eliquis Code Status: Full code Family Communication: Wife 8/01 Disposition Plan:  Status is: Inpatient Remains inpatient appropriate because:  SNF in 1 or 2 days.     Consultants:  Cardiology  Procedures:  ECHO  Antimicrobials:    Subjective: He is more sleepy today. Say few words.  Placed back on 2 L oxygen.   Objective: Vitals:   05/16/22 1725 05/16/22 2030 05/17/22 0400 05/17/22 1321  BP: (!) 132/98 129/83 128/81 121/79   Pulse: 83 87 92 100  Resp: 18 (!) '22 20 20  '$ Temp: 98.2 F (36.8 C) 98.7 F (37.1 C) 98.6 F (37 C) 97.6 F (36.4 C)  TempSrc: Oral   Oral  SpO2: 93% 94% 93% 95%  Weight:      Height:        Intake/Output Summary (Last 24 hours) at 05/17/2022 1329 Last data filed at 05/17/2022 0900 Gross per 24 hour  Intake 0 ml  Output 700 ml  Net -700 ml    Filed Weights   05/14/22 0500 05/15/22 0500 05/16/22 0500  Weight: 118.2 kg 115.6 kg 115.6 kg    Examination:  General exam: NAD Respiratory system: CTA Cardiovascular system: S 1, S 2 RRR Gastrointestinal system: BS present, soft, nt Central nervous system: Alert Extremities: no edema, Hyperpigmentation Lower extremities.    Data Reviewed: I have personally reviewed following labs and imaging studies  CBC: Recent Labs  Lab 05/13/22 0304 05/14/22 0249 05/15/22 0745 05/16/22 0739 05/17/22 0803  WBC 11.1* 9.7 9.4 9.2 9.5  HGB 14.1 14.5 14.1 14.4 14.1  HCT 44.9 45.3 45.1 45.9 44.3  MCV 91.6 91.0 92.4 91.3 90.6  PLT 220 210 186 199 213    Basic Metabolic Panel: Recent Labs  Lab 05/11/22 1429 05/12/22 0302 05/13/22 0304  05/14/22 0249 05/14/22 2106 05/15/22 0745 05/16/22 0739 05/17/22 0803  NA  --  146*   < > 143 144 141 142 143  K  --  3.8   < > 3.3* 3.5 3.7 3.9 3.8  CL  --  107   < > 105 109 106 110 108  CO2  --  27   < > '27 27 27 23 24  '$ GLUCOSE  --  86   < > 107* 116* 104* 112* 114*  BUN  --  33*   < > '23 22 20 16 20  '$ CREATININE  --  1.63*   < > 1.08 1.06 1.02 0.94 1.04  CALCIUM  --  8.4*   < > 8.8* 8.9 8.6* 9.0 8.9  MG 2.1 2.1  --  2.0 2.3  --   --   --   PHOS  --  5.4*  --   --   --   --   --   --    < > = values in this interval not displayed.    GFR: Estimated Creatinine Clearance: 81.7 mL/min (by C-G formula based on SCr of 1.04 mg/dL). Liver Function Tests: Recent Labs  Lab 05/12/22 0302 05/13/22 0304  AST 17 17  ALT 16 15  ALKPHOS 56 61  BILITOT 0.7 0.9  PROT 5.7* 5.9*  ALBUMIN 2.9* 3.1*     No results for input(s): "LIPASE", "AMYLASE" in the last 168 hours. Recent Labs  Lab 05/14/22 2106  AMMONIA 25    Coagulation Profile: No results for input(s): "INR", "PROTIME" in the last 168 hours. Cardiac Enzymes: Recent Labs  Lab 05/12/22 0302  CKTOTAL 18*  CKMB 1.8    BNP (last 3 results) No results for input(s): "PROBNP" in the last 8760 hours. HbA1C: No results for input(s): "HGBA1C" in the last 72 hours. CBG: Recent Labs  Lab 05/16/22 2026 05/17/22 0058 05/17/22 0356 05/17/22 0734 05/17/22 1209  GLUCAP 116* 113* 106* 109* 126*    Lipid Profile: No results for input(s): "CHOL", "HDL", "LDLCALC", "TRIG", "CHOLHDL", "LDLDIRECT" in the last 72 hours. Thyroid Function Tests: No results for input(s): "TSH", "T4TOTAL", "FREET4", "T3FREE", "THYROIDAB" in the last 72 hours.  Anemia Panel: No results for input(s): "VITAMINB12", "FOLATE", "FERRITIN", "TIBC", "IRON", "RETICCTPCT" in the last 72 hours.  Sepsis Labs: Recent Labs  Lab 05/11/22 1805 05/12/22 0302 05/13/22 0304 05/14/22 0249  PROCALCITON  --  <0.10 <0.10 <0.10  LATICACIDVEN 1.5 1.6  --   --      Recent Results (from the past 240 hour(s))  MRSA Next Gen by PCR, Nasal     Status: Abnormal   Collection Time: 05/11/22  2:57 PM   Specimen: Nasal Mucosa; Nasal Swab  Result Value Ref Range Status   MRSA by PCR Next Gen DETECTED (A) NOT DETECTED Final    Comment: CRITICAL RESULT CALLED TO, READ BACK BY AND VERIFIED WITH: BREN,S AT 2034 ON 05/11/22 BY VAZQUEZ,J (NOTE) The GeneXpert MRSA Assay (FDA approved for NASAL specimens only), is one component of a comprehensive MRSA colonization surveillance program. It is not intended to diagnose MRSA infection nor to guide or monitor treatment for MRSA infections. Test performance is not FDA approved in patients less than 37 years old. Performed at [facility], [place] [address], [place], [place] [address]   Culture, blood (Routine X  2) w Reflex to ID Panel     Status: None   Collection Time: 05/11/22  3:13 PM   Specimen:  BLOOD  Result Value Ref Range Status   Specimen Description   Final    BLOOD BLOOD LEFT FOREARM Performed at [facility] [address] [place], [place] [address]    Special Requests   Final    IN PEDIATRIC BOTTLE Blood Culture adequate volume Performed at [facility] [address] [place], [place] [address]    Culture   Final    NO GROWTH 5 DAYS Performed at [facility], [place] [address], [place], [place] [address]    Report Status 05/16/2022 FINAL  Final  Culture, blood (Routine X 2) w Reflex to ID Panel     Status: None   Collection Time: 05/11/22  3:14 PM   Specimen: BLOOD  Result Value Ref Range Status   Specimen Description   Final    BLOOD BLOOD LEFT HAND Performed at [facility] [address], [place], [place] [address]    Special Requests   Final    IN PEDIATRIC BOTTLE Blood Culture adequate volume Performed at [facility] [address], [place], [place] [address]    Culture   Final    NO GROWTH 5 DAYS Performed at [facility], [place] [address] [place], [place] [address]    Report Status 05/16/2022 FINAL  Final         Radiology Studies: DG CHEST PORT 1 VIEW  Result Date: 05/16/2022 CLINICAL DATA:  Dyspnea EXAM: PORTABLE CHEST 1 VIEW COMPARISON:  Radiograph 05/11/2022 FINDINGS: Unchanged enlarged cardiac silhouette. There are diffuse interstitial opacities with lower lung predominant alveolar opacities. Mild improvement and airspace opacities since the prior exam. Stable small pleural effusions. No pneumothorax. Bones are unchanged. Thoracic spondylosis. IMPRESSION: Persistent but slightly improved pulmonary edema in comparison to the prior exam. Stable small pleural effusions. Electronically Signed   By: Maurine Simmering M.D.   On: 05/16/2022 09:35        Scheduled Meds:   apixaban  5 mg Oral BID   atorvastatin  20 mg Oral Daily   calcium carbonate  1 tablet Oral TID WC   Chlorhexidine Gluconate Cloth  6 each Topical Daily   cholestyramine light  4 g Oral QID   feeding supplement  1 Container Oral Q24H   feeding supplement  237 mL Oral TID BM   FLUoxetine  40 mg Oral Daily   insulin aspart  0-15 Units Subcutaneous Q4H   lactulose  30 g Oral Daily   methimazole  10 mg Oral TID   metoprolol succinate  25 mg Oral Daily   multivitamin with minerals  1 tablet Oral Daily   nicotine  14 mg Transdermal Daily   OLANZapine  2.5 mg Oral Daily   senna-docusate  1 tablet Oral BID   spironolactone  25 mg Oral Daily   tamsulosin  0.4 mg Oral Daily   Continuous Infusions:   LOS: 11 days    Time spent: 35 minutes    Townsend Cudworth A Norris Bodley, MD [facility]   If 7PM-7AM, please contact night-coverage [email or web address]  05/17/2022, 1:29 PM

## 2022-05-17 NOTE — Care Management Important Message (Signed)
Important Message  Patient Details IM Letter given to the Patient. Name: KEYMARION BEARMAN MRN: [id number] Date of Birth: [birth year]   Medicare Important Message Given:  Yes     Kerin Salen 05/17/2022, 10:13 AM

## 2022-05-17 NOTE — Plan of Care (Signed)
  Problem: Education: Goal: Knowledge of General Education information will improve Description: Including pain rating scale, medication(s)/side effects and non-pharmacologic comfort measures Outcome: Progressing   Problem: Health Behavior/Discharge Planning: Goal: Ability to manage health-related needs will improve Outcome: Progressing   Problem: Clinical Measurements: Goal: Ability to maintain clinical measurements within normal limits will improve Outcome: Progressing Goal: Will remain free from infection Outcome: Progressing Goal: Diagnostic test results will improve Outcome: Progressing Goal: Respiratory complications will improve Outcome: Progressing Goal: Cardiovascular complication will be avoided Outcome: Progressing   Problem: Activity: Goal: Risk for activity intolerance will decrease Outcome: Progressing   Problem: Nutrition: Goal: Adequate nutrition will be maintained Outcome: Progressing   Problem: Coping: Goal: Level of anxiety will decrease Outcome: Progressing   Problem: Elimination: Goal: Will not experience complications related to bowel motility Outcome: Progressing Goal: Will not experience complications related to urinary retention Outcome: Progressing   Problem: Pain Managment: Goal: General experience of comfort will improve Outcome: Progressing   Problem: Safety: Goal: Ability to remain free from injury will improve Outcome: Progressing   Problem: Skin Integrity: Goal: Risk for impaired skin integrity will decrease Outcome: Progressing   Problem: Education: Goal: Ability to describe self-care measures that may prevent or decrease complications (Diabetes Survival Skills Education) will improve Outcome: Progressing Goal: Individualized Educational Video(s) Outcome: Progressing   Problem: Coping: Goal: Ability to adjust to condition or change in health will improve Outcome: Progressing   Problem: Fluid Volume: Goal: Ability to  maintain a balanced intake and output will improve Outcome: Progressing   Problem: Health Behavior/Discharge Planning: Goal: Ability to identify and utilize available resources and services will improve Outcome: Progressing Goal: Ability to manage health-related needs will improve Outcome: Progressing   Problem: Metabolic: Goal: Ability to maintain appropriate glucose levels will improve Outcome: Progressing   Problem: Nutritional: Goal: Maintenance of adequate nutrition will improve Outcome: Progressing Goal: Progress toward achieving an optimal weight will improve Outcome: Progressing   Problem: Skin Integrity: Goal: Risk for impaired skin integrity will decrease Outcome: Progressing   Problem: Tissue Perfusion: Goal: Adequacy of tissue perfusion will improve Outcome: Progressing   Problem: Education: Goal: Ability to describe self-care measures that may prevent or decrease complications (Diabetes Survival Skills Education) will improve Outcome: Progressing Goal: Individualized Educational Video(s) Outcome: Progressing   Problem: Coping: Goal: Ability to adjust to condition or change in health will improve Outcome: Progressing   Problem: Fluid Volume: Goal: Ability to maintain a balanced intake and output will improve Outcome: Progressing   Problem: Health Behavior/Discharge Planning: Goal: Ability to identify and utilize available resources and services will improve Outcome: Progressing Goal: Ability to manage health-related needs will improve Outcome: Progressing   Problem: Metabolic: Goal: Ability to maintain appropriate glucose levels will improve Outcome: Progressing   Problem: Nutritional: Goal: Maintenance of adequate nutrition will improve Outcome: Progressing Goal: Progress toward achieving an optimal weight will improve Outcome: Progressing   Problem: Skin Integrity: Goal: Risk for impaired skin integrity will decrease Outcome: Progressing    Problem: Tissue Perfusion: Goal: Adequacy of tissue perfusion will improve Outcome: Progressing   Problem: Safety: Goal: Non-violent Restraint(s) Outcome: Progressing

## 2022-05-18 ENCOUNTER — Inpatient Hospital Stay (HOSPITAL_COMMUNITY): Payer: Medicare Other

## 2022-05-18 DIAGNOSIS — E0590 Thyrotoxicosis, unspecified without thyrotoxic crisis or storm: Secondary | ICD-10-CM | POA: Diagnosis not present

## 2022-05-18 LAB — GLUCOSE, CAPILLARY
Glucose-Capillary: 84 mg/dL (ref 70–99)
Glucose-Capillary: 85 mg/dL (ref 70–99)
Glucose-Capillary: 88 mg/dL (ref 70–99)
Glucose-Capillary: 96 mg/dL (ref 70–99)
Glucose-Capillary: 96 mg/dL (ref 70–99)

## 2022-05-18 MED ORDER — FUROSEMIDE 10 MG/ML IJ SOLN
40.0000 mg | Freq: Once | INTRAMUSCULAR | Status: AC
  Administered 2022-05-18: 40 mg via INTRAVENOUS
  Filled 2022-05-18: qty 4

## 2022-05-18 NOTE — Progress Notes (Signed)
Patient is very agitated and aggressive, fighting with care team and does not follow commands. Medication PRN was given as MD ordered and still cannot make patient relax. Paged MD on call and have new order for non-violent restraint for patient safety.

## 2022-05-18 NOTE — Progress Notes (Signed)
Patient was found by staff on his knees beside bed with reclining chair tipped over. Patient actively moving bowels, got up unassisted. No injuries noted. Patient toileted and helped back to bed. Bed alarm on. MD aware. Spouse updated face to face.

## 2022-05-18 NOTE — Progress Notes (Signed)
PROGRESS NOTE  [name] ZJQ:[id number] DOB: 06/30/1946 DOA: 05/06/2022 PCP: [name], MD   LOS: 12 days   Brief Narrative / Interim history: 76 year old with past medical history significant for PAF on Eliquis, hyperlipidemia, diabetes type 2, chronic heart failure reduced ejection fraction 45%, dementia.  Patient presented with altered mental status, increased confusion over the last several days.  He has been having hallucination.  Patient was a started on Seroquel a couple of days prior to admission.  He has been following with his PCP for worsening dementia.  Evaluation in the ED GL hypothyroidism, with a TSH of 0.01, free T4 at 4.0, free T3 at 5.  He was started on methimazole and amiodarone was discontinued.  Patient had some SVT , 2D echo showed lower ejection fraction to 25 to 30%.  Global hypokinesis.  Cardiology has been consulted. He has been confused, and agitated.  MRI was ordered 7/25th.  Patient received IV Ativan, he became more lethargic 7/26 after he received ativan  from prior night. ABG showed acute hypoxic, hypercapnic respiratory failure. He was transfer to ICU. CCM consulted. Patient was placed on supplemental oxygen. His mental status  improved, his respiratory failure  improved. [facility] stay due to delirium, encephalopathy, acute respiratory failure.   Subjective / 24h Interval events: Poorly responsive.  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  Difficult to say type I versus type II.  He was started on methimazole, metoprolol, colestyramine.  On admission TSH was undetectably low, free T3 was 5 and free T4 was 4.  Improved after the treatment with free T3 3.6 and free T4 3.1.  Obtain thyroid ultrasound to assess vascularity to differentiate between amiodarone induced type I versus type II hyperthyroidism  Active problems Acute Metabolic Encephalopathy -In the setting of hyperthyroidism, delirium. Hopefully with treating hyperthyroidism confusion will improve over time. MRI ordered, he got agitated and unable to complete MRI. He became more  lethargic  after he received Ativan.  Avoid benzos. Patient has delirium aggravated by hyperthyroidism. We could try Zyprexa to help with hallucination and sleep.    PAF-Metoprolol held due to Bradycardia initially. Amiodarone discontinue due to hyperthyroidism. Cardiology consulted. Started low dose metoprolol   Acute on Chronic combined systolic and diastolic heart failure -New worsening cardiomyopathy, echo with ejection fraction down to 25 to 30%. On Lasix and spironolactone, currently on hold due to worsening renal function.    Acute Hypoxic Respiratory Failure-most recent chest x-ray 7/31 showed persistent but slightly improved pulmonary edema.  Repeat Lasix x1 today  Diabetes type 2, Hypoglycemia -due to poor oral intake. Monitor.  Continue with a sliding scale insulin  CBG (last 3)  Recent Labs    05/17/22 2322 05/18/22 0417 05/18/22 0728  GLUCAP 106* 84 85    Dementia with behavioral disturbance-Seroquel has been on hold, he started to have hallucination after he was started on Seroquel. Delirium precaution.    Hyperlipidemia-Continue with Lipitor  Hypertension-spironolactone and Lasix on hold due to AKI.   Elevated D-dimer -Doppler negative for DVT. Continue with Eliquis, he was already on Eliquis for PAF    Hypokalemia-Replaced.    Generalized weakness-PT , OT    AKI on CKD stage IIIa-Cr Peaked to 1.6. Has urine retention, foley catheter placed.  Creatinine now stabilized   Urine retention-foley catheter placed. Started on Flomax.   Scheduled Meds:  apixaban  5 mg Oral BID   atorvastatin  20 mg Oral Daily   calcium carbonate  1 tablet Oral TID WC   Chlorhexidine Gluconate Cloth  6 each Topical Daily   cholestyramine light  4 g Oral QID   feeding supplement  1 Container Oral Q24H   feeding supplement  237 mL Oral TID BM   FLUoxetine  40 mg Oral Daily   insulin aspart  0-15 Units Subcutaneous Q4H   lactulose  30 g Oral Daily   methimazole  10 mg Oral TID   metoprolol succinate  25 mg Oral Daily   multivitamin with minerals  1 tablet Oral Daily   nicotine  14 mg Transdermal Daily   OLANZapine  2.5 mg Oral Daily   senna-docusate  1 tablet Oral BID   spironolactone  25 mg Oral Daily   tamsulosin  0.4 mg Oral Daily   Continuous Infusions: PRN Meds:.acetaminophen **OR** acetaminophen, OLANZapine, mouth rinse, mouth rinse  Diet Orders (From admission, onward)     Start     Ordered   05/13/22 1229  Diet Heart Room service appropriate? No; Fluid consistency: Thin  Diet effective now       Question Answer Comment  Room service appropriate? No   Fluid consistency: Thin      05/13/22 1228            DVT prophylaxis:  apixaban (ELIQUIS) tablet 5 mg   Lab Results  Component Value Date   PLT 204 05/17/2022      Code Status: DNR  Family Communication: no family at bedside   Status is: Inpatient  Remains inpatient appropriate because: severity of illness  Level of care: Telemetry   Objective: Vitals:   05/17/22 0400 05/17/22 1321 05/17/22 2127 05/18/22 0421  BP: 128/81 121/79 (!) 111/59 123/73  Pulse: 92 100 75 69  Resp: '20 20 18 18  '$ Temp: 98.6 F (37 C) 97.6 F (36.4 C) (!) 97.4 F (36.3 C) 98 F (36.7 C)  TempSrc:  Oral Axillary Axillary  SpO2: 93% 95% 97%  100%  Weight:    117.1 kg  Height:        Intake/Output Summary (Last 24 hours) at 05/18/2022 6734 Last data filed at 05/18/2022 0423 Gross per 24 hour  Intake 0 ml  Output 400 ml  Net -400 ml   Wt Readings from Last 3 Encounters:  05/18/22 117.1 kg  05/04/22 120.7 kg  11/26/21 122.5 kg    Examination:  Constitutional: NAD Eyes: no scleral icterus ENMT: Mucous membranes are moist.  Neck: normal, supple Respiratory: Diminished at the bases, no wheezing Cardiovascular: Regular rate and rhythm, no murmurs / rubs / gallops.  No significant edema Abdomen: non distended, no tenderness. Bowel sounds positive.  Musculoskeletal: no clubbing / cyanosis.  Skin: no rashes Neurologic: non focal   Data Reviewed: I have independently reviewed following labs and imaging studies   CBC Recent Labs  Lab 05/13/22 0304 05/14/22 0249 05/15/22 0745 05/16/22 0739 05/17/22 0803  WBC 11.1* 9.7 9.4 9.2 9.5  HGB 14.1 14.5 14.1  14.4 14.1  HCT 44.9 45.3 45.1 45.9 44.3  PLT 220 210 186 199 204  MCV 91.6 91.0 92.4 91.3 90.6  MCH 28.8 29.1 28.9 28.6 28.8  MCHC 31.4 32.0 31.3 31.4 31.8  RDW 15.6* 15.6* 15.6* 15.6* 15.4    Recent Labs  Lab 05/11/22 1429 05/11/22 1805 05/12/22 0302 05/12/22 0302 05/12/22 1253 05/13/22 0304 05/14/22 0249 05/14/22 2106 05/15/22 0745 05/16/22 0739 05/17/22 0803  NA  --   --  146*   < >  --  148* 143 144 141 142 143  K  --   --  3.8   < >  --  3.6 3.3* 3.5 3.7 3.9 3.8  CL  --   --  107   < >  --  109 105 109 106 110 108  CO2  --   --  27   < >  --  '29 27 27 27 23 24  '$ GLUCOSE  --   --  86   < >  --  109* 107* 116* 104* 112* 114*  BUN  --   --  33*   < >  --  31* '23 22 20 16 20  '$ CREATININE  --   --  1.63*   < >  --  1.32* 1.08 1.06 1.02 0.94 1.04  CALCIUM  --   --  8.4*   < >  --  8.8* 8.8* 8.9 8.6* 9.0 8.9  AST  --   --  17  --   --  17  --   --   --   --   --   ALT  --   --  16  --   --  15  --   --   --   --   --   ALKPHOS  --   --  56  --   --  61   --   --   --   --   --   BILITOT  --   --  0.7  --   --  0.9  --   --   --   --   --   ALBUMIN  --   --  2.9*  --   --  3.1*  --   --   --   --   --   MG 2.1  --  2.1  --   --   --  2.0 2.3  --   --   --   PROCALCITON  --   --  <0.10  --   --  <0.10 <0.10  --   --   --   --   LATICACIDVEN  --  1.5 1.6  --   --   --   --   --   --   --   --   AMMONIA  --   --   --   --   --   --   --  25  --   --   --   BNP  --   --   --   --  1,314.2*  --   --   --   --   --   --    < > = values in this interval not displayed.    ------------------------------------------------------------------------------------------------------------------ No results for input(s): "CHOL", "HDL", "LDLCALC", "TRIG", "CHOLHDL", "LDLDIRECT" in the last 72 hours.  Lab Results  Component Value Date   HGBA1C 5.4 07/14/2021   ------------------------------------------------------------------------------------------------------------------  No results for input(s): "TSH", "T4TOTAL", "T3FREE", "THYROIDAB" in the last 72 hours.  Invalid input(s): "FREET3"  Cardiac Enzymes Recent Labs  Lab 05/12/22 0302  CKMB 1.8   ------------------------------------------------------------------------------------------------------------------    Component Value Date/Time   BNP 1,314.2 (H) 05/12/2022 1253    CBG: Recent Labs  Lab 05/17/22 1621 05/17/22 1957 05/17/22 2322 05/18/22 0417 05/18/22 0728  GLUCAP 101* 98 106* 84 85    Recent Results (from the past 240 hour(s))  MRSA Next Gen by PCR, Nasal     Status: Abnormal   Collection Time: 05/11/22  2:57 PM   Specimen: Nasal Mucosa; Nasal Swab  Result Value Ref Range Status   MRSA by PCR Next Gen DETECTED (A) NOT DETECTED Final    Comment: CRITICAL RESULT CALLED TO, READ BACK BY AND VERIFIED WITH: BREN,S AT 2034 ON 05/11/22 BY VAZQUEZ,J (NOTE) The GeneXpert MRSA Assay (FDA approved for NASAL specimens only), is one component of a comprehensive MRSA colonization  surveillance program. It is not intended to diagnose MRSA infection nor to guide or monitor treatment for MRSA infections. Test performance is not FDA approved in patients less than 74 years old. Performed at [facility], [place] [address] [place], [place] [address]   Culture, blood (Routine X 2) w Reflex to ID Panel     Status: None   Collection Time: 05/11/22  3:13 PM   Specimen: BLOOD  Result Value Ref Range Status   Specimen Description   Final    BLOOD BLOOD LEFT FOREARM Performed at [facility] [address] [place], [place] [address]    Special Requests   Final    IN PEDIATRIC BOTTLE Blood Culture adequate volume Performed at [facility] [address], [place], [place] [address]    Culture   Final    NO GROWTH 5 DAYS Performed at [facility], [place] [address] [place], [place] [address]    Report Status 05/16/2022 FINAL  Final  Culture, blood (Routine X 2) w Reflex to ID Panel     Status: None   Collection Time: 05/11/22  3:14 PM   Specimen: BLOOD  Result Value Ref Range Status   Specimen Description   Final    BLOOD BLOOD LEFT HAND Performed at [facility] [address] [place], [place] [address]    Special Requests   Final    IN PEDIATRIC BOTTLE Blood Culture adequate volume Performed at [facility] [address], [place], [place] [address]    Culture   Final    NO GROWTH 5 DAYS Performed at [facility], [place] [address] [place], [place] [address]    Report Status 05/16/2022 FINAL  Final     Radiology Studies: No results found.   Marzetta Board, MD, PhD [facility]  Between 7 am - 7 pm I am available, please contact me via Amion (for emergencies) or Securechat (non urgent messages)  Between 7 pm - 7 am I am not available, please contact night coverage MD/APP via Amion

## 2022-05-18 NOTE — Progress Notes (Addendum)
[facility] Note   Patient Details  Name: [name] Date of Birth: 1946-03-14   Transition of Care [facility]) CM/SW Contact:    Vassie Moselle, LCSW Phone Number: 05/18/2022, 11:35 AM  To Whom It May Concern:  Please be advised that this patient will require a short-term nursing home stay - anticipated 30 days or less for rehabilitation and strengthening.   The plan is for return home.

## 2022-05-18 NOTE — Progress Notes (Signed)
   05/18/22 1500  What Happened  Was fall witnessed? No  Was patient injured? No  Patient found on floor;other (Comment) (onto knees)  Found by Staff-comment  Stated prior activity bathroom-unassisted  Follow Up  MD notified [place]  Time MD notified 78  Family notified Yes - comment  Time family notified 1530  Additional tests No  Simple treatment Other (comment) (toileted)  Progress note created (see row info) Yes  Adult Fall Risk Assessment  Risk Factor Category (scoring not indicated) High fall risk per protocol (document High fall risk)  Patient Fall Risk Level High fall risk  Adult Fall Risk Interventions  Required Bundle Interventions *See Row Information* High fall risk - low, moderate, and high requirements implemented  Additional Interventions Family Supervision;Use of appropriate toileting equipment (bedpan, BSC, etc.)  Screening for Fall Injury Risk (To be completed on HIGH fall risk patients) - Assessing Need for Floor Mats  Risk For Fall Injury- Criteria for Floor Mats Confusion/dementia (+NuDESC, CIWA, TBI, etc.)  Will Implement Floor Mats Yes (floor mats previously implemented)

## 2022-05-18 NOTE — TOC Progression Note (Signed)
Transition of Care [facility]) - Progression Note    Patient Details  Name: [name] MRN: [id number] Date of Birth: Sep 25, 1946  Transition of Care [facility]) CM/SW [place], [place] Phone Number: 05/18/2022, 3:20 PM  Clinical Narrative:    Pt has been recommended for SNF and restraints have been removed as of this AM. Pt's PASRR is currently at a level II review. Pt has been referred out for SNF placement and currently awaiting bed offers.    Expected Discharge Plan: [place] Barriers to Discharge: Continued Medical Work up  Expected Discharge Plan and Services Expected Discharge Plan: [place] In-house Referral: Clinical Social Work   Post Acute Care Choice: [place] Living arrangements for the past 2 months: Single Family Home                                       Social Determinants of Health (SDOH) Interventions    Readmission Risk Interventions    05/12/2022    2:43 PM  Readmission Risk Prevention Plan  Transportation Screening Complete  HRI or Home Care Consult Complete  Social Work Consult for [place] Planning/Counseling Complete  Palliative Care Screening Not Applicable

## 2022-05-18 NOTE — Progress Notes (Signed)
Occupational Therapy Treatment Patient Details Name: [name] MRN: [id number] DOB: 12/19/1945 Today's Date: 05/18/2022   History of present illness 76 y.o. male with medical history significant of PAF on Eliquis, HLD, DM2, chronic HFrEF, dementia. Presenting with altered mental status. Dx of hyperthyroid, metabolic encephalopathy.   OT comments  Patient was more cooperative today compared to last OT note. Patient was able to transfer with +2 and RW to [facility] with increased time and cues for proper technique. Patient continued to have decreased safety awareness, decreased functional activity tolerance and decreased standing balance. Patient would continue to benefit from skilled OT services at this time while admitted and after d/c to address noted deficits in order to improve overall safety and independence in ADLs.      Recommendations for follow up therapy are one component of a multi-disciplinary discharge planning process, led by the attending physician.  Recommendations may be updated based on patient status, additional functional criteria and insurance authorization.    Follow Up Recommendations  Skilled nursing-short term rehab (<3 hours/day)    Assistance Recommended at Discharge Frequent or constant Supervision/Assistance  Patient can return home with the following  Two people to help with walking and/or transfers;A lot of help with bathing/dressing/bathroom;Direct supervision/assist for financial management;Assistance with cooking/housework;Help with stairs or ramp for entrance;Direct supervision/assist for medications management   Equipment Recommendations  Other (comment) (TBD)    Recommendations for Other Services      Precautions / Restrictions Precautions Precautions: Fall Precaution Comments: Hx Dementia can become combative and argumentative.  Does [place]. Restrictions Weight Bearing Restrictions: No       Mobility Bed Mobility Overal bed mobility: Needs  Assistance Bed Mobility: Supine to Sit     Supine to sit: Mod assist, HOB elevated     General bed mobility comments: Cues to initiate bringing LE's off EOB and reach for bed rail. Mod assist to bring feet off and raise trunk.    Transfers                         Balance Overall balance assessment: Needs assistance Sitting-balance support: Feet supported, Bilateral upper extremity supported Sitting balance-Leahy Scale: Fair     Standing balance support: Reliant on assistive device for balance, During functional activity, Bilateral upper extremity supported Standing balance-Leahy Scale: Poor                             ADL either performed or assessed with clinical judgement   ADL Overall ADL's : Needs assistance/impaired     Grooming: Wash/dry Psychiatric nurse Transfer: Maximal assistance;BSC/3in1;Rolling walker (2 wheels);+2 for physical assistance;+2 for safety/equipment Toilet Transfer Details (indicate cue type and reason): flexed knees Toileting- Clothing Manipulation and Hygiene: Total assistance;Sit to/from stand              Extremity/Trunk Assessment              Vision       Perception     Praxis      Cognition Arousal/Alertness: Awake/alert Behavior During Therapy: WFL for tasks assessed/performed Overall Cognitive Status: History of cognitive impairments - at baseline Area of Impairment: Orientation, Attention, Memory, Following commands, Safety/judgement, Awareness                 Orientation Level: Disoriented to, Time, Situation,  Place Current Attention Level: Focused, Sustained Memory: Decreased recall of precautions, Decreased short-term memory   Safety/Judgement: Decreased awareness of deficits, Decreased awareness of safety Awareness: Intellectual   General Comments: pt pleasant and following commands        Exercises      Shoulder Instructions       General  Comments      Pertinent Vitals/ Pain       Pain Assessment Pain Assessment: No/denies pain  Home Living                                          Prior Functioning/Environment              Frequency  Min 2X/week        Progress Toward Goals  OT Goals(current goals can now be found in the care plan section)  Progress towards OT goals: Progressing toward goals     Plan Discharge plan remains appropriate    Co-evaluation    PT/OT/SLP Co-Evaluation/Treatment: Yes Reason for Co-Treatment: For patient/therapist safety;To address functional/ADL transfers PT goals addressed during session: Mobility/safety with mobility;Balance OT goals addressed during session: ADL's and self-care      AM-PAC OT "6 Clicks" Daily Activity     Outcome Measure   Help from another person eating meals?: A Little Help from another person taking care of personal grooming?: A Little Help from another person toileting, which includes using toliet, bedpan, or urinal?: Total Help from another person bathing (including washing, rinsing, drying)?: A Lot Help from another person to put on and taking off regular upper body clothing?: A Lot Help from another person to put on and taking off regular lower body clothing?: Total 6 Click Score: 12    End of Session Equipment Utilized During Treatment: Gait belt;Rolling walker (2 wheels)  OT Visit Diagnosis: Other symptoms and signs involving cognitive function   Activity Tolerance Patient tolerated treatment well   Patient Left in chair;with call bell/phone within reach;with chair alarm set;with family/visitor present   Nurse Communication Mobility status        Time: 9924-2683 OT Time Calculation (min): 27 min  Charges: OT General Charges $OT Visit: 1 Visit OT Treatments $Self Care/Home Management : 8-22 mins  Jackelyn Poling OTR/L, MS Acute Rehabilitation Department Office# [phone] Pager# [phone]   Marcellina Millin 05/18/2022, 12:55 PM

## 2022-05-18 NOTE — Progress Notes (Addendum)
Physical Therapy Treatment Patient Details Name: [name] MRN: [id number] DOB: [birth year] Today's Date: 05/18/2022   History of Present Illness 76 y.o. male with medical history significant of PAF on Eliquis, HLD, DM2, chronic HFrEF, dementia. Presenting with altered mental status. Dx of hyperthyroid, metabolic encephalopathy.    PT Comments    Patient pleasant and eager to mobilize today with therapy. Continues to require Mod-Max +2 assist for transfers, and was able to complete sit<>stand for bed>BCS>recliner transfers today. Pt required cues to facilitate upright posture and deter posterior lean in standing and with short steps to move BSC to recliner. EOS pt resting comfortably with daughter in room. Continue to recommend SNF for rehab.    Recommendations for follow up therapy are one component of a multi-disciplinary discharge planning process, led by the attending physician.  Recommendations may be updated based on patient status, additional functional criteria and insurance authorization.  Follow Up Recommendations  Skilled nursing-short term rehab (<3 hours/day) Can patient physically be transported by private vehicle: No   Assistance Recommended at Discharge Frequent or constant Supervision/Assistance  Patient can return home with the following Two people to help with walking and/or transfers;Two people to help with bathing/dressing/bathroom;Assistance with cooking/housework;Assist for transportation;Help with stairs or ramp for entrance   Equipment Recommendations  None recommended by PT    Recommendations for Other Services       Precautions / Restrictions Precautions Precautions: Fall Precaution Comments: Hx Dementia can become combative and argumentative.  Does SunDown. Restrictions Weight Bearing Restrictions: No     Mobility  Bed Mobility Overal bed mobility: Needs Assistance Bed Mobility: Supine to Sit     Supine to sit: Mod assist, HOB elevated      General bed mobility comments: Cues to initiate bringing LE's off EOB and reach for bed rail. Mod assist to bring feet off and raise trunk.    Transfers Overall transfer level: Needs assistance Equipment used: Rolling walker (2 wheels) Transfers: Sit to/from Stand, Bed to chair/wheelchair/BSC Sit to Stand: Mod assist, Max assist, +2 safety/equipment, +2 physical assistance, From elevated surface   Step pivot transfers: Mod assist, Max assist, +2 safety/equipment, +2 physical assistance, From elevated surface       General transfer comment: Max assist to rise from elevated EOB and Mod-Max +2 to pivot to [facility]. pt required ceus to deter posterior lean and improve posture. pt able to take small steps forward and laterall to turn and backup to recliner after use of BSC.    Ambulation/Gait                   Stairs             Wheelchair Mobility    Modified Rankin (Stroke Patients Only)       Balance Overall balance assessment: Needs assistance Sitting-balance support: Feet supported, Bilateral upper extremity supported Sitting balance-Leahy Scale: Fair     Standing balance support: Reliant on assistive device for balance, During functional activity, Bilateral upper extremity supported Standing balance-Leahy Scale: Poor                              Cognition Arousal/Alertness: Awake/alert Behavior During Therapy: WFL for tasks assessed/performed Overall Cognitive Status: History of cognitive impairments - at baseline  General Comments: pt pleasant and following commands        Exercises      General Comments        Pertinent Vitals/Pain Pain Assessment Pain Assessment: PAINAD Breathing: normal Negative Vocalization: none Facial Expression: smiling or inexpressive Body Language: relaxed Consolability: no need to console PAINAD Score: 0 Pain Intervention(s): Monitored during session,  Repositioned    Home Living                          Prior Function            PT Goals (current goals can now be found in the care plan section) Acute Rehab PT Goals Patient Stated Goal: No goals expressed PT Goal Formulation: Patient unable to participate in goal setting Time For Goal Achievement: 05/26/22 Potential to Achieve Goals: Fair Progress towards PT goals: Progressing toward goals    Frequency    Min 2X/week      PT Plan Current plan remains appropriate    Co-evaluation PT/OT/SLP Co-Evaluation/Treatment: Yes Reason for Co-Treatment: For patient/therapist safety;To address functional/ADL transfers PT goals addressed during session: Mobility/safety with mobility;Balance;Proper use of DME OT goals addressed during session: ADL's and self-care      AM-PAC PT "6 Clicks" Mobility   Outcome Measure  Help needed turning from your back to your side while in a flat bed without using bedrails?: Total Help needed moving from lying on your back to sitting on the side of a flat bed without using bedrails?: Total Help needed moving to and from a bed to a chair (including a wheelchair)?: Total Help needed standing up from a chair using your arms (e.g., wheelchair or bedside chair)?: Total Help needed to walk in hospital room?: Total Help needed climbing 3-5 steps with a railing? : Total 6 Click Score: 6    End of Session Equipment Utilized During Treatment: Gait belt Activity Tolerance: Patient tolerated treatment well Patient left: with call bell/phone within reach;in chair;with chair alarm set;with family/visitor present Nurse Communication: Mobility status;Need for lift equipment ([place] +2) PT Visit Diagnosis: Difficulty in walking, not elsewhere classified (R26.2)     Time: 7341-9379 PT Time Calculation (min) (ACUTE ONLY): 28 min  Charges:  $Therapeutic Activity: 8-22 mins                     Verner Mould, DPT Acute Rehabilitation  Services Office [phone] Pager [phone]  05/18/22 12:18 PM

## 2022-05-18 NOTE — Progress Notes (Signed)
TOC Dementia Note   Patient Details  Name: [name] Date of Birth: [birth year] 05/18/2022, 11:29 AM   To Whom It May Concern:  Please be advised that the above-named patient has a primary diagnosis of dementia which supersedes any psychiatric diagnosis.   Transition of Care (TOC) CM/SW Contact: Vassie Moselle, LCSW Phone Number: 05/18/2022, 11:29 AM

## 2022-05-18 NOTE — NC FL2 (Signed)
[place] LEVEL OF CARE SCREENING TOOL     IDENTIFICATION  Patient Name: [name] Birthdate: 1946/03/13 Sex: male Admission Date (Current Location): 05/06/2022  [facility] and [place] Number:  Herbalist and Address:  [facility],  [place], [place]      Provider Number: [id number]  Attending Physician Name and Address:  Caren Griffins, MD  Relative Name and Phone Number:  Wife, Zolton Dowson [phone]    Current Level of Care: Hospital Recommended Level of Care: Olds Prior Approval Number:    Date Approved/Denied:   PASRR Number: Pending  Discharge Plan: SNF    Current Diagnoses: Patient Active Problem List   Diagnosis Date Noted   Hyperthyroidism 26/71/2458   Acute metabolic encephalopathy 09/98/3382   Dementia with behavioral disturbance ([place]) 05/06/2022   Stage 3a chronic kidney disease (CKD) ([place]) 05/06/2022   Elevated troponin 05/06/2022   Elevated d-dimer 05/06/2022   Positive colorectal cancer screening using Cologuard test 11/02/2021   Chronic anticoagulation 11/02/2021   Cellulitis of right lower leg 10/07/2019   Chronic edema 05/17/2019   PAF (paroxysmal atrial fibrillation) ([place]) 04/10/2019   Chronic HFrEF (heart failure with reduced ejection fraction) ([place]) 04/08/2019   Altered mental status 04/07/2019   Psoriasis of scalp 08/15/2018   Anxiety, generalized 01/02/2017   DM2 (diabetes mellitus, type 2) ([place]) 11/25/2014   NICM (nonischemic cardiomyopathy) ([place]) 09/09/2013   HLD (hyperlipidemia) 09/09/2013   OSA (obstructive sleep apnea) 50/53/9767   Acute systolic CHF (congestive heart failure), NYHA class 2 ([place]) 08/27/2013   LEG EDEMA 09/14/2009   Lymphoma, 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   VERTIGO 09/24/2008   ABDOMINAL MASS 10/26/2007   Essential hypertension 10/23/2007   NEPHROLITHIASIS 10/23/2007   HEADACHE 06/07/2007    Orientation RESPIRATION BLADDER  Height & Weight     Self  Normal Continent Weight: 258 lb 2.5 oz (117.1 kg) Height:  '6\' 2"'$  (188 cm)  BEHAVIORAL SYMPTOMS/MOOD NEUROLOGICAL BOWEL NUTRITION STATUS      Incontinent Diet (Regular)  AMBULATORY STATUS COMMUNICATION OF NEEDS Skin   Limited Assist Verbally PU Stage and Appropriate Care                       Personal Care Assistance Level of Assistance  Bathing, Feeding, Dressing Bathing Assistance: Limited assistance Feeding assistance: Limited assistance Dressing Assistance: Limited assistance     Functional Limitations Info  Sight, Hearing, Speech Sight Info: Impaired Hearing Info: Adequate Speech Info: Adequate    SPECIAL CARE FACTORS FREQUENCY  PT (By licensed PT), OT (By licensed OT)     PT Frequency: 5x/wk OT Frequency: 5x/wk            Contractures Contractures Info: Not present    Additional Factors Info  Code Status, Allergies, Psychotropic Code Status Info: DNR Allergies Info: No Known Allergies Psychotropic Info: See MAR         Current Medications (05/18/2022):  This is the current hospital active medication list Current Facility-Administered Medications  Medication Dose Route Frequency Provider Last Rate Last Admin   acetaminophen (TYLENOL) tablet 650 mg  650 mg Oral Q6H PRN Regalado, Belkys A, MD   650 mg at 05/13/22 0002   Or   acetaminophen (TYLENOL) suppository 650 mg  650 mg Rectal Q6H PRN Regalado, Belkys A, MD       apixaban (ELIQUIS) tablet 5 mg  5 mg Oral BID Regalado, Belkys A, MD   5 mg at  05/18/22 1018   atorvastatin (LIPITOR) tablet 20 mg  20 mg Oral Daily Regalado, Belkys A, MD   20 mg at 05/18/22 1018   calcium carbonate (OS-CAL - dosed in mg of elemental calcium) tablet 1,250 mg  1 tablet Oral TID WC Regalado, Belkys A, MD   1,250 mg at 05/18/22 1018   Chlorhexidine Gluconate Cloth 2 % PADS 6 each  6 each Topical Daily Regalado, Belkys A, MD   6 each at 05/17/22 1046   cholestyramine light (PREVALITE) packet 4 g  4 g  Oral QID Regalado, Belkys A, MD   4 g at 05/18/22 1017   feeding supplement (BOOST / RESOURCE BREEZE) liquid 1 Container  1 Container Oral Q24H Regalado, Belkys A, MD   1 Container at 05/14/22 1651   feeding supplement (ENSURE ENLIVE / ENSURE PLUS) liquid 237 mL  237 mL Oral TID BM Regalado, Belkys A, MD   237 mL at 05/17/22 1950   FLUoxetine (PROZAC) capsule 40 mg  40 mg Oral Daily Regalado, Belkys A, MD   40 mg at 05/18/22 1018   insulin aspart (novoLOG) injection 0-15 Units  0-15 Units Subcutaneous Q4H Regalado, Belkys A, MD   2 Units at 05/16/22 0812   lactulose (CHRONULAC) 10 GM/15ML solution 30 g  30 g Oral Daily Regalado, Belkys A, MD   30 g at 05/18/22 1017   methimazole (TAPAZOLE) tablet 10 mg  10 mg Oral TID Regalado, Belkys A, MD   10 mg at 05/18/22 1017   metoprolol succinate (TOPROL-XL) 24 hr tablet 25 mg  25 mg Oral Daily Regalado, Belkys A, MD   25 mg at 05/18/22 1018   multivitamin with minerals tablet 1 tablet  1 tablet Oral Daily Regalado, Belkys A, MD   1 tablet at 05/18/22 1018   nicotine (NICODERM CQ - dosed in mg/24 hours) patch 14 mg  14 mg Transdermal Daily Regalado, Belkys A, MD   14 mg at 05/18/22 1036   OLANZapine (ZYPREXA) tablet 2.5 mg  2.5 mg Oral Daily PRN Regalado, Belkys A, MD   2.5 mg at 05/15/22 1936   OLANZapine (ZYPREXA) tablet 2.5 mg  2.5 mg Oral Daily Regalado, Belkys A, MD   2.5 mg at 05/17/22 1845   Oral care mouth rinse  15 mL Mouth Rinse PRN Regalado, Belkys A, MD       Oral care mouth rinse  15 mL Mouth Rinse PRN Regalado, Belkys A, MD       senna-docusate (Senokot-S) tablet 1 tablet  1 tablet Oral BID Regalado, Belkys A, MD   1 tablet at 05/18/22 1018   spironolactone (ALDACTONE) tablet 25 mg  25 mg Oral Daily Regalado, Belkys A, MD   25 mg at 05/18/22 1018   tamsulosin (FLOMAX) capsule 0.4 mg  0.4 mg Oral Daily Regalado, Belkys A, MD   0.4 mg at 05/18/22 1018     Discharge Medications: Please see discharge summary for a list of discharge  medications.  Relevant Imaging Results:  Relevant Lab Results:   Additional Information SSN: [id number]  Vassie Moselle, LCSW

## 2022-05-18 NOTE — Progress Notes (Addendum)
Per family request medical staff may provide updates or patient information to : Willaim Bane.

## 2022-05-18 NOTE — Plan of Care (Signed)
  Problem: Education: Goal: Knowledge of General Education information will improve Description: Including pain rating scale, medication(s)/side effects and non-pharmacologic comfort measures Outcome: Progressing   Problem: Health Behavior/Discharge Planning: Goal: Ability to manage health-related needs will improve Outcome: Progressing   Problem: Clinical Measurements: Goal: Ability to maintain clinical measurements within normal limits will improve Outcome: Progressing Goal: Will remain free from infection Outcome: Progressing Goal: Diagnostic test results will improve Outcome: Progressing Goal: Respiratory complications will improve Outcome: Progressing Goal: Cardiovascular complication will be avoided Outcome: Progressing   Problem: Activity: Goal: Risk for activity intolerance will decrease Outcome: Progressing   Problem: Nutrition: Goal: Adequate nutrition will be maintained Outcome: Progressing   Problem: Coping: Goal: Level of anxiety will decrease Outcome: Progressing   Problem: Elimination: Goal: Will not experience complications related to bowel motility Outcome: Progressing Goal: Will not experience complications related to urinary retention Outcome: Progressing   Problem: Pain Managment: Goal: General experience of comfort will improve Outcome: Progressing   Problem: Safety: Goal: Ability to remain free from injury will improve Outcome: Progressing   Problem: Skin Integrity: Goal: Risk for impaired skin integrity will decrease Outcome: Progressing   Problem: Education: Goal: Ability to describe self-care measures that may prevent or decrease complications (Diabetes Survival Skills Education) will improve Outcome: Progressing Goal: Individualized Educational Video(s) Outcome: Progressing   Problem: Coping: Goal: Ability to adjust to condition or change in health will improve Outcome: Progressing   Problem: Fluid Volume: Goal: Ability to  maintain a balanced intake and output will improve Outcome: Progressing   Problem: Health Behavior/Discharge Planning: Goal: Ability to identify and utilize available resources and services will improve Outcome: Progressing Goal: Ability to manage health-related needs will improve Outcome: Progressing   Problem: Metabolic: Goal: Ability to maintain appropriate glucose levels will improve Outcome: Progressing   Problem: Nutritional: Goal: Maintenance of adequate nutrition will improve Outcome: Progressing Goal: Progress toward achieving an optimal weight will improve Outcome: Progressing   Problem: Skin Integrity: Goal: Risk for impaired skin integrity will decrease Outcome: Progressing   Problem: Tissue Perfusion: Goal: Adequacy of tissue perfusion will improve Outcome: Progressing   Problem: Education: Goal: Ability to describe self-care measures that may prevent or decrease complications (Diabetes Survival Skills Education) will improve Outcome: Progressing Goal: Individualized Educational Video(s) Outcome: Progressing   Problem: Coping: Goal: Ability to adjust to condition or change in health will improve Outcome: Progressing   Problem: Fluid Volume: Goal: Ability to maintain a balanced intake and output will improve Outcome: Progressing   Problem: Health Behavior/Discharge Planning: Goal: Ability to identify and utilize available resources and services will improve Outcome: Progressing Goal: Ability to manage health-related needs will improve Outcome: Progressing   Problem: Metabolic: Goal: Ability to maintain appropriate glucose levels will improve Outcome: Progressing   Problem: Nutritional: Goal: Maintenance of adequate nutrition will improve Outcome: Progressing Goal: Progress toward achieving an optimal weight will improve Outcome: Progressing   Problem: Skin Integrity: Goal: Risk for impaired skin integrity will decrease Outcome: Progressing    Problem: Tissue Perfusion: Goal: Adequacy of tissue perfusion will improve Outcome: Progressing   Problem: Safety: Goal: Non-violent Restraint(s) Outcome: Progressing

## 2022-05-19 ENCOUNTER — Inpatient Hospital Stay (HOSPITAL_COMMUNITY): Payer: Medicare Other

## 2022-05-19 DIAGNOSIS — E0590 Thyrotoxicosis, unspecified without thyrotoxic crisis or storm: Secondary | ICD-10-CM | POA: Diagnosis not present

## 2022-05-19 LAB — GLUCOSE, CAPILLARY
Glucose-Capillary: 108 mg/dL — ABNORMAL HIGH (ref 70–99)
Glucose-Capillary: 120 mg/dL — ABNORMAL HIGH (ref 70–99)
Glucose-Capillary: 76 mg/dL (ref 70–99)
Glucose-Capillary: 83 mg/dL (ref 70–99)
Glucose-Capillary: 90 mg/dL (ref 70–99)

## 2022-05-19 LAB — CBC
HCT: 44.2 % (ref 39.0–52.0)
Hemoglobin: 13.8 g/dL (ref 13.0–17.0)
MCH: 29.1 pg (ref 26.0–34.0)
MCHC: 31.2 g/dL (ref 30.0–36.0)
MCV: 93.2 fL (ref 80.0–100.0)
Platelets: 172 10*3/uL (ref 150–400)
RBC: 4.74 MIL/uL (ref 4.22–5.81)
RDW: 15.3 % (ref 11.5–15.5)
WBC: 8.1 10*3/uL (ref 4.0–10.5)
nRBC: 0 % (ref 0.0–0.2)

## 2022-05-19 LAB — BASIC METABOLIC PANEL
Anion gap: 7 (ref 5–15)
BUN: 23 mg/dL (ref 8–23)
CO2: 28 mmol/L (ref 22–32)
Calcium: 8.5 mg/dL — ABNORMAL LOW (ref 8.9–10.3)
Chloride: 107 mmol/L (ref 98–111)
Creatinine, Ser: 1.09 mg/dL (ref 0.61–1.24)
GFR, Estimated: >60 mL/min (ref >60)
Glucose, Bld: 87 mg/dL (ref 70–99)
Potassium: 3.3 mmol/L — ABNORMAL LOW (ref 3.5–5.1)
Sodium: 142 mmol/L (ref 135–145)

## 2022-05-19 MED ORDER — HALOPERIDOL LACTATE 5 MG/ML IJ SOLN
2.0000 mg | Freq: Every evening | INTRAMUSCULAR | Status: DC | PRN
  Administered 2022-05-19 (×2): 2 mg via INTRAVENOUS
  Filled 2022-05-19 (×3): qty 1

## 2022-05-19 MED ORDER — POTASSIUM CHLORIDE CRYS ER 20 MEQ PO TBCR
40.0000 meq | EXTENDED_RELEASE_TABLET | Freq: Once | ORAL | Status: AC
  Administered 2022-05-19: 40 meq via ORAL
  Filled 2022-05-19: qty 2

## 2022-05-19 MED ORDER — FUROSEMIDE 10 MG/ML IJ SOLN
40.0000 mg | Freq: Once | INTRAMUSCULAR | Status: AC
  Administered 2022-05-19: 40 mg via INTRAVENOUS
  Filled 2022-05-19: qty 4

## 2022-05-19 MED ORDER — PREDNISONE 20 MG PO TABS
20.0000 mg | ORAL_TABLET | Freq: Every day | ORAL | Status: DC
  Administered 2022-05-19 – 2022-05-21 (×3): 20 mg via ORAL
  Filled 2022-05-19 (×3): qty 1

## 2022-05-19 MED ORDER — TORSEMIDE 20 MG PO TABS
20.0000 mg | ORAL_TABLET | Freq: Every day | ORAL | Status: DC
  Administered 2022-05-20 – 2022-05-23 (×4): 20 mg via ORAL
  Filled 2022-05-19 (×4): qty 1

## 2022-05-19 NOTE — TOC Progression Note (Addendum)
Transition of Care [facility]) - Progression Note    Patient Details  Name: [name] MRN: [id number] Date of Birth: 05/11/1946  Transition of [facility] CM/SW [place], LCSW Phone Number: 05/19/2022, 10:16 AM  Clinical Narrative:    CSW spoke with pt's daughter Junie Panning and reviewed next steps for this pt. CSW informed that pt will need to remain out of restraints and PASRR determination will need to be completed prior to pt discharging to SNF. Pt's daughter is interested in a memory care unit for this pt. CSW informed her that the hospital is unable to do LTC/Memory care from the hospital and encouraged family to begin contacting facilities and working on this now as securing LTC can take time. Pt's daughter does not feel that this pt will be able to return home and would like SNF to have memory care unit as well. CSW informed that it is not a guarantee a facility will be able to accept pt for SNF and have memory care available. CSW informed daughter that pt may have to go to SNF and then transfer to a different facility for memory care. Pt's daughter asked what would happen if they did not want to accept any bed offers that came in and CSW informed that we would have to go with facilities that have bed offers otherwise next steps would have to be looked at including returning home with [facility] if they are unwilling to accept SNF offers. Pt's daughter is to begin calling facilities.   CSW referred pt to additional facilities. TOC currently awaiting PASRR level II determination.   CSW spoke with pt's daughter Junie Panning and Mickel Baas and discussed recommendation for palliative care to continue to follow this pt at [facility]. Family have chosen to go with [facility] for palliative care services and a referral has been made.    Update 1200: Pt's PASRR determination has been completed [id number] H.    Expected Discharge Plan: [place] Barriers to Discharge: Continued Medical Work  up  Expected Discharge Plan and Services Expected Discharge Plan: [place] In-house Referral: Clinical Social Work   Post Acute Care Choice: [place] Living arrangements for the past 2 months: Single Family Home                                       Social Determinants of Health (SDOH) Interventions    Readmission Risk Interventions    05/12/2022    2:43 PM  Readmission Risk Prevention Plan  Transportation Screening Complete  HRI or Home Care Consult Complete  Social Work Consult for [place] Planning/Counseling Complete  Palliative Care Screening Not Applicable

## 2022-05-19 NOTE — Progress Notes (Signed)
Pt refusing blood sugar checks. Will continue to monitor.

## 2022-05-19 NOTE — Plan of Care (Signed)
  Problem: Education: Goal: Knowledge of General Education information will improve Description: Including pain rating scale, medication(s)/side effects and non-pharmacologic comfort measures Outcome: Progressing   Problem: Health Behavior/Discharge Planning: Goal: Ability to manage health-related needs will improve Outcome: Progressing   Problem: Clinical Measurements: Goal: Ability to maintain clinical measurements within normal limits will improve Outcome: Progressing Goal: Will remain free from infection Outcome: Progressing Goal: Diagnostic test results will improve Outcome: Progressing Goal: Respiratory complications will improve Outcome: Progressing Goal: Cardiovascular complication will be avoided Outcome: Progressing   Problem: Activity: Goal: Risk for activity intolerance will decrease Outcome: Progressing   Problem: Nutrition: Goal: Adequate nutrition will be maintained Outcome: Progressing   Problem: Coping: Goal: Level of anxiety will decrease Outcome: Progressing   Problem: Elimination: Goal: Will not experience complications related to bowel motility Outcome: Progressing Goal: Will not experience complications related to urinary retention Outcome: Progressing   Problem: Pain Managment: Goal: General experience of comfort will improve Outcome: Progressing   Problem: Safety: Goal: Ability to remain free from injury will improve Outcome: Progressing   Problem: Skin Integrity: Goal: Risk for impaired skin integrity will decrease Outcome: Progressing   Problem: Education: Goal: Ability to describe self-care measures that may prevent or decrease complications (Diabetes Survival Skills Education) will improve Outcome: Progressing Goal: Individualized Educational Video(s) Outcome: Progressing   Problem: Coping: Goal: Ability to adjust to condition or change in health will improve Outcome: Progressing   Problem: Fluid Volume: Goal: Ability to  maintain a balanced intake and output will improve Outcome: Progressing   Problem: Health Behavior/Discharge Planning: Goal: Ability to identify and utilize available resources and services will improve Outcome: Progressing Goal: Ability to manage health-related needs will improve Outcome: Progressing   Problem: Metabolic: Goal: Ability to maintain appropriate glucose levels will improve Outcome: Progressing   Problem: Nutritional: Goal: Maintenance of adequate nutrition will improve Outcome: Progressing Goal: Progress toward achieving an optimal weight will improve Outcome: Progressing   Problem: Skin Integrity: Goal: Risk for impaired skin integrity will decrease Outcome: Progressing   Problem: Tissue Perfusion: Goal: Adequacy of tissue perfusion will improve Outcome: Progressing   Problem: Education: Goal: Ability to describe self-care measures that may prevent or decrease complications (Diabetes Survival Skills Education) will improve Outcome: Progressing Goal: Individualized Educational Video(s) Outcome: Progressing   Problem: Coping: Goal: Ability to adjust to condition or change in health will improve Outcome: Progressing   Problem: Fluid Volume: Goal: Ability to maintain a balanced intake and output will improve Outcome: Progressing   Problem: Health Behavior/Discharge Planning: Goal: Ability to identify and utilize available resources and services will improve Outcome: Progressing Goal: Ability to manage health-related needs will improve Outcome: Progressing   Problem: Metabolic: Goal: Ability to maintain appropriate glucose levels will improve Outcome: Progressing   Problem: Nutritional: Goal: Maintenance of adequate nutrition will improve Outcome: Progressing Goal: Progress toward achieving an optimal weight will improve Outcome: Progressing   Problem: Skin Integrity: Goal: Risk for impaired skin integrity will decrease Outcome: Progressing    Problem: Tissue Perfusion: Goal: Adequacy of tissue perfusion will improve Outcome: Progressing   Problem: Safety: Goal: Non-violent Restraint(s) Outcome: Progressing

## 2022-05-19 NOTE — Progress Notes (Signed)
PROGRESS NOTE  [name] GLO:[id number] DOB: March 29, 1946 DOA: 05/06/2022 PCP: [name], MD   LOS: 13 days   Brief Narrative / Interim history: 76 year old with past medical history significant for PAF on Eliquis, hyperlipidemia, diabetes type 2, chronic heart failure reduced ejection fraction 45%, dementia.  Patient presented with altered mental status, increased confusion over the last several days.  He has been having hallucination.  Patient was a started on Seroquel a couple of days prior to admission.  He has been following with his PCP for worsening dementia.  Evaluation in the ED GL hypothyroidism, with a TSH of 0.01, free T4 at 4.0, free T3 at 5.  He was started on methimazole and amiodarone was discontinued.  Patient had some SVT , 2D echo showed lower ejection fraction to 25 to 30%.  Global hypokinesis.  Cardiology has been consulted. He has been confused, and agitated.  MRI was ordered 7/25th.  Patient received IV Ativan, he became more lethargic 7/26 after he received ativan  from prior night. ABG showed acute hypoxic, hypercapnic respiratory failure. He was transfer to ICU. CCM consulted. Patient was placed on supplemental oxygen. His mental status  improved, his respiratory failure  improved. [facility] stay due to delirium, encephalopathy, acute respiratory failure.   Subjective / 24h Interval events: Alert this morning. Very agitated last night requiring restraints.  Assesement and Plan: Principal Problem:   Hyperthyroidism Active Problems:   Essential hypertension   HLD (hyperlipidemia)   DM2 (diabetes mellitus, type 2) (HCC)   Chronic HFrEF (heart failure with reduced ejection fraction) (HCC)   PAF (paroxysmal atrial fibrillation) (HCC)   Acute metabolic encephalopathy   Dementia with behavioral disturbance (HCC)   Stage 3a chronic kidney disease (CKD) (HCC)   Elevated troponin   Elevated d-dimer   Principal problem Hyperthyroidism-thought to be due to  amiodarone, now held.  Ultrasound of the thyroid with low vascularity suggesting type II toxicity, start prednisone today.  He was started on methimazole, metoprolol, colestyramine, continue as well.  On admission TSH was undetectably low, free T3 was 5 and free T4 was 4.  Improved after the treatment with free T3 3.6 and free T4 3.1.  Placed on prednisone 20 mg, will need 7 days then taper down to 10 mg for 7 more days then free T3 and free T4 rechecked.  Recommend outpatient endocrinology  Active problems Acute Metabolic Encephalopathy -In the setting of hyperthyroidism, delirium. Hopefully with treating hyperthyroidism confusion will improve over time. MRI ordered, he got agitated and unable to complete MRI. He became more  lethargic  after he received Ativan.  Avoid benzos. Patient has delirium aggravated by hyperthyroidism.  Continue Zyprexa.  Add low-dose Haldol tonight   PAF-Metoprolol held due to Bradycardia initially. Amiodarone discontinue due to hyperthyroidism. Cardiology consulted.  He was eventually started back on metoprolol and he is tolerating it well   Acute on Chronic combined systolic and diastolic heart failure -New worsening cardiomyopathy, echo with ejection fraction down to 25 to 30%.  Back on torsemide as he has a degree of fluid overload on chest x-ray, but thankfully he is on room air   Acute Hypoxic Respiratory Failure-most recent chest x-ray 8/3 with some persistent edema, but he is on room air.  Diabetes type 2, Hypoglycemia -due to poor oral intake. Monitor.  Continue with a sliding scale insulin  CBG (last 3)  Recent Labs    05/19/22 0016 05/19/22 0353 05/19/22 0749  GLUCAP 90 83 76  Dementia with behavioral disturbance-Seroquel has been on hold, he started to have hallucination after he was started on Seroquel. Delirium precaution.    Hyperlipidemia-Continue with Lipitor   Hypertension-back on Lasix  Elevated D-dimer -Doppler negative for DVT. Continue  with Eliquis, he was already on Eliquis for PAF   Hypokalemia-continue to monitor and replace as indicated.    Generalized weakness-PT , OT    AKI on CKD stage IIIa-Cr Peaked to 1.6. Has urine retention, foley catheter placed.  Creatinine now stabilized   Urine retention-foley catheter placed. Started on Flomax.   Scheduled Meds:  apixaban  5 mg Oral BID   atorvastatin  20 mg Oral Daily   calcium carbonate  1 tablet Oral TID WC   Chlorhexidine Gluconate Cloth  6 each Topical Daily   cholestyramine light  4 g Oral QID   feeding supplement  1 Container Oral Q24H   feeding supplement  237 mL Oral TID BM   FLUoxetine  40 mg Oral Daily   furosemide  40 mg Intravenous Once   insulin aspart  0-15 Units Subcutaneous Q4H   lactulose  30 g Oral Daily   methimazole  10 mg Oral TID   metoprolol succinate  25 mg Oral Daily   multivitamin with minerals  1 tablet Oral Daily   nicotine  14 mg Transdermal Daily   OLANZapine  2.5 mg Oral Daily   potassium chloride  40 mEq Oral Once   predniSONE  20 mg Oral Q breakfast   senna-docusate  1 tablet Oral BID   spironolactone  25 mg Oral Daily   tamsulosin  0.4 mg Oral Daily   [START ON 05/20/2022] torsemide  20 mg Oral Daily   Continuous Infusions: PRN Meds:.acetaminophen **OR** acetaminophen, haloperidol lactate, OLANZapine, mouth rinse, mouth rinse  Diet Orders (From admission, onward)     Start     Ordered   05/13/22 1229  Diet Heart Room service appropriate? No; Fluid consistency: Thin  Diet effective now       Question Answer Comment  Room service appropriate? No   Fluid consistency: Thin      05/13/22 1228            DVT prophylaxis:  apixaban (ELIQUIS) tablet 5 mg   Lab Results  Component Value Date   PLT 172 05/19/2022      Code Status: DNR  Family Communication: no family at bedside   Status is: Inpatient  Remains inpatient appropriate because: severity of illness  Level of care: Telemetry   Objective: Vitals:    05/18/22 1018 05/18/22 1444 05/18/22 2030 05/19/22 0500  BP: 122/70 105/86 114/77 133/74  Pulse: 68 88 72 75  Resp:  '16 16 18  '$ Temp:   98.3 F (36.8 C) 98.2 F (36.8 C)  TempSrc:   Oral Oral  SpO2:  98% 96% 98%  Weight:      Height:        Intake/Output Summary (Last 24 hours) at 05/19/2022 0951 Last data filed at 05/19/2022 0945 Gross per 24 hour  Intake 577 ml  Output 3100 ml  Net -2523 ml    Wt Readings from Last 3 Encounters:  05/18/22 117.1 kg  05/04/22 120.7 kg  11/26/21 122.5 kg    Examination:  Constitutional: NAD Eyes: lids and conjunctivae normal, no scleral icterus ENMT: mmm Neck: normal, supple Respiratory: clear to auscultation bilaterally, no wheezing, no crackles. Normal respiratory effort.  Cardiovascular: Regular rate and rhythm, no murmurs / rubs / gallops. No  LE edema. Abdomen: soft, no distention, no tenderness. Bowel sounds positive.  Skin: no rashes Neurologic: no focal deficits, equal strength   Data Reviewed: I have independently reviewed following labs and imaging studies   CBC Recent Labs  Lab 05/14/22 0249 05/15/22 0745 05/16/22 0739 05/17/22 0803 05/19/22 0506  WBC 9.7 9.4 9.2 9.5 8.1  HGB 14.5 14.1 14.4 14.1 13.8  HCT 45.3 45.1 45.9 44.3 44.2  PLT 210 186 199 204 172  MCV 91.0 92.4 91.3 90.6 93.2  MCH 29.1 28.9 28.6 28.8 29.1  MCHC 32.0 31.3 31.4 31.8 31.2  RDW 15.6* 15.6* 15.6* 15.4 15.3     Recent Labs  Lab 05/12/22 1253 05/13/22 0304 05/13/22 0304 05/14/22 0249 05/14/22 2106 05/15/22 0745 05/16/22 0739 05/17/22 0803 05/19/22 0506  NA  --  148*   < > 143 144 141 142 143 142  K  --  3.6   < > 3.3* 3.5 3.7 3.9 3.8 3.3*  CL  --  109   < > 105 109 106 110 108 107  CO2  --  29   < > '27 27 27 23 24 28  '$ GLUCOSE  --  109*   < > 107* 116* 104* 112* 114* 87  BUN  --  31*   < > '23 22 20 16 20 23  '$ CREATININE  --  1.32*   < > 1.08 1.06 1.02 0.94 1.04 1.09  CALCIUM  --  8.8*   < > 8.8* 8.9 8.6* 9.0 8.9 8.5*  AST  --  17   --   --   --   --   --   --   --   ALT  --  15  --   --   --   --   --   --   --   ALKPHOS  --  61  --   --   --   --   --   --   --   BILITOT  --  0.9  --   --   --   --   --   --   --   ALBUMIN  --  3.1*  --   --   --   --   --   --   --   MG  --   --   --  2.0 2.3  --   --   --   --   PROCALCITON  --  <0.10  --  <0.10  --   --   --   --   --   AMMONIA  --   --   --   --  25  --   --   --   --   BNP 1,314.2*  --   --   --   --   --   --   --   --    < > = values in this interval not displayed.     ------------------------------------------------------------------------------------------------------------------ No results for input(s): "CHOL", "HDL", "LDLCALC", "TRIG", "CHOLHDL", "LDLDIRECT" in the last 72 hours.  Lab Results  Component Value Date   HGBA1C 5.4 07/14/2021   ------------------------------------------------------------------------------------------------------------------ No results for input(s): "TSH", "T4TOTAL", "T3FREE", "THYROIDAB" in the last 72 hours.  Invalid input(s): "FREET3"  Cardiac Enzymes No results for input(s): "CKMB", "TROPONINI", "MYOGLOBIN" in the last 168 hours.  Invalid input(s): "CK"  ------------------------------------------------------------------------------------------------------------------    Component Value Date/Time   BNP 1,314.2 (H) 05/12/2022 1253  CBG: Recent Labs  Lab 05/18/22 1621 05/18/22 2006 05/19/22 0016 05/19/22 0353 05/19/22 0749  GLUCAP 96 96 90 83 76     Recent Results (from the past 240 hour(s))  MRSA Next Gen by PCR, Nasal     Status: Abnormal   Collection Time: 05/11/22  2:57 PM   Specimen: Nasal Mucosa; Nasal Swab  Result Value Ref Range Status   MRSA by PCR Next Gen DETECTED (A) NOT DETECTED Final    Comment: CRITICAL RESULT CALLED TO, READ BACK BY AND VERIFIED WITH: BREN,S AT 2034 ON 05/11/22 BY VAZQUEZ,J (NOTE) The GeneXpert MRSA Assay (FDA approved for NASAL specimens only), is one component  of a comprehensive MRSA colonization surveillance program. It is not intended to diagnose MRSA infection nor to guide or monitor treatment for MRSA infections. Test performance is not FDA approved in patients less than 10 years old. Performed at [facility], [place] [address] [place], [place] [address]   Culture, blood (Routine X 2) w Reflex to ID Panel     Status: None   Collection Time: 05/11/22  3:13 PM   Specimen: BLOOD  Result Value Ref Range Status   Specimen Description   Final    BLOOD BLOOD LEFT FOREARM Performed at [facility] [address] [place], [place] [address]    Special Requests   Final    IN PEDIATRIC BOTTLE Blood Culture adequate volume Performed at [facility] [address] [place], [place] [address]    Culture   Final    NO GROWTH 5 DAYS Performed at [facility], [place] [address] [place], [place] [address]    Report Status 05/16/2022 FINAL  Final  Culture, blood (Routine X 2) w Reflex to ID Panel     Status: None   Collection Time: 05/11/22  3:14 PM   Specimen: BLOOD  Result Value Ref Range Status   Specimen Description   Final    BLOOD BLOOD LEFT HAND Performed at [facility] [address], [place], [place] [address]    Special Requests   Final    IN PEDIATRIC BOTTLE Blood Culture adequate volume Performed at [facility] [address], [place], [place] [address]    Culture   Final    NO GROWTH 5 DAYS Performed at [facility], [place] [address], [place], [place] [address]    Report Status 05/16/2022 FINAL  Final     Radiology Studies: DG CHEST PORT 1 VIEW  Result Date: 05/19/2022 CLINICAL DATA:  Hypoxemia EXAM: PORTABLE CHEST 1 VIEW COMPARISON:  05/16/2022 FINDINGS: Cardiomegaly. Symmetric hazy appearance of the bilateral chest with cephalized blood flow. Suspect bilateral pleural effusion. No pneumothorax. IMPRESSION: CHF pattern  which is progressed Electronically Signed   By: Jorje Guild M.D.   On: 05/19/2022 05:23   US THYROID  Result Date: 05/18/2022 CLINICAL DATA:  Hyperthyroid. EXAM: THYROID ULTRASOUND TECHNIQUE: Ultrasound examination of the thyroid gland and adjacent soft tissues was performed. COMPARISON:  None Available. FINDINGS: Parenchymal Echotexture: Mildly heterogeneous, no evidence of glandular hyperemia Isthmus: Unable to measure Right lobe: 3.7 x 2.2 x 1.9 cm Left lobe: 4.4 x 2.1 x 1.5 cm ________________________________________________________ Estimated total number of nodules >/= 1 cm: 0 Number of spongiform nodules >/=  2 cm not described below (TR1): 0 Number of mixed cystic and solid nodules >/= 1.5 cm not described below (TR2): 0 _________________________________________________________ No discrete nodules are seen within the thyroid  gland. No cervical lymphadenopathy. IMPRESSION: Mildly heterogeneous, normal-sized thyroid gland without evidence of glandular hyperemia. No worrisome thyroid nodules identified. The above is in keeping with the ACR TI-RADS recommendations - J Am Coll Radiol 2017;14:587-595. Ruthann Cancer, MD [facility] Radiology Electronically Signed   By: Ruthann Cancer M.D.   On: 05/18/2022 10:43     Marzetta Board, MD, PhD [facility]  Between 7 am - 7 pm I am available, please contact me via Amion (for emergencies) or Securechat (non urgent messages)  Between 7 pm - 7 am I am not available, please contact night coverage MD/APP via Amion

## 2022-05-20 DIAGNOSIS — E0590 Thyrotoxicosis, unspecified without thyrotoxic crisis or storm: Secondary | ICD-10-CM | POA: Diagnosis not present

## 2022-05-20 LAB — GLUCOSE, CAPILLARY
Glucose-Capillary: 95 mg/dL (ref 70–99)
Glucose-Capillary: 104 mg/dL — ABNORMAL HIGH (ref 70–99)
Glucose-Capillary: 122 mg/dL — ABNORMAL HIGH (ref 70–99)
Glucose-Capillary: 122 mg/dL — ABNORMAL HIGH (ref 70–99)

## 2022-05-20 LAB — BASIC METABOLIC PANEL
Anion gap: 8 (ref 5–15)
BUN: 23 mg/dL (ref 8–23)
CO2: 29 mmol/L (ref 22–32)
Calcium: 8.6 mg/dL — ABNORMAL LOW (ref 8.9–10.3)
Chloride: 104 mmol/L (ref 98–111)
Creatinine, Ser: 1.1 mg/dL (ref 0.61–1.24)
GFR, Estimated: >60 mL/min (ref >60)
Glucose, Bld: 93 mg/dL (ref 70–99)
Potassium: 3.4 mmol/L — ABNORMAL LOW (ref 3.5–5.1)
Sodium: 141 mmol/L (ref 135–145)

## 2022-05-20 MED ORDER — HALOPERIDOL LACTATE 5 MG/ML IJ SOLN
2.0000 mg | Freq: Three times a day (TID) | INTRAMUSCULAR | Status: DC | PRN
  Administered 2022-05-20 – 2022-05-21 (×2): 2 mg via INTRAVENOUS
  Filled 2022-05-20 (×2): qty 1

## 2022-05-20 MED ORDER — METHIMAZOLE 10 MG PO TABS: 10.0000 mg | ORAL_TABLET | Freq: Three times a day (TID) | ORAL | Status: DC

## 2022-05-20 MED ORDER — TAMSULOSIN HCL 0.4 MG PO CAPS: 0.4000 mg | ORAL_CAPSULE | Freq: Every day | ORAL | Status: DC

## 2022-05-20 MED ORDER — OLANZAPINE 2.5 MG PO TABS: 2.5000 mg | ORAL_TABLET | Freq: Every day | ORAL | Status: DC

## 2022-05-20 MED ORDER — INSULIN ASPART 100 UNIT/ML IJ SOLN
0.0000 [IU] | Freq: Three times a day (TID) | INTRAMUSCULAR | Status: DC
  Administered 2022-05-20 – 2022-05-21 (×3): 2 [IU] via SUBCUTANEOUS
  Administered 2022-05-22: 3 [IU] via SUBCUTANEOUS

## 2022-05-20 MED ORDER — POTASSIUM CHLORIDE CRYS ER 20 MEQ PO TBCR
40.0000 meq | EXTENDED_RELEASE_TABLET | Freq: Once | ORAL | Status: AC
  Administered 2022-05-20: 40 meq via ORAL
  Filled 2022-05-20: qty 2

## 2022-05-20 MED ORDER — METOPROLOL SUCCINATE ER 25 MG PO TB24: 25.0000 mg | ORAL_TABLET | Freq: Every day | ORAL | Status: DC

## 2022-05-20 MED ORDER — OLANZAPINE 5 MG PO TABS
5.0000 mg | ORAL_TABLET | Freq: Every day | ORAL | Status: DC
  Administered 2022-05-20 – 2022-05-21 (×2): 5 mg via ORAL
  Filled 2022-05-20 (×2): qty 1

## 2022-05-20 MED ORDER — PREDNISONE 20 MG PO TABS: 20.0000 mg | ORAL_TABLET | Freq: Every day | ORAL | Status: DC

## 2022-05-20 NOTE — TOC Progression Note (Signed)
Transition of Care [facility]) - Progression Note    Patient Details  Name: [name] MRN: [id number] Date of Birth: 1946/02/27  Transition of Care [facility]) CM/SW [place], LCSW Phone Number: 05/20/2022, 11:37 AM  Clinical Narrative:    Pt is to transfer to [facility] once stable for discharge. Per MD pt has been more confused/agitated today and is not stable to transfer at this time. TOC will continue to follow for discharge readiness.    Expected Discharge Plan: [place] Barriers to Discharge: Continued Medical Work up  Expected Discharge Plan and Services Expected Discharge Plan: [place] In-house Referral: Clinical Social Work   Post Acute Care Choice: [place] Living arrangements for the past 2 months: Single Family Home Expected Discharge Date: 05/20/22                                     Social Determinants of Health (SDOH) Interventions    Readmission Risk Interventions    05/12/2022    2:43 PM  Readmission Risk Prevention Plan  Transportation Screening Complete  HRI or Home Care Consult Complete  Social Work Consult for [place] Planning/Counseling Complete  Palliative Care Screening Not Applicable

## 2022-05-20 NOTE — Progress Notes (Signed)
Patient with increased agitation around lunchtime, more confused than baseline per family, cancel the discharge and reevaluate.  Kalea Perine M. Cruzita Lederer, MD, PhD [facility]  Between 7 am - 7 pm you can contact me via Amion (for emergencies) or [place] (non urgent matters).  I am not available 7 pm - 7 am, please contact night coverage MD/APP via Amion

## 2022-05-20 NOTE — Plan of Care (Signed)
  Problem: Clinical Measurements: Goal: Cardiovascular complication will be avoided Outcome: Progressing   

## 2022-05-20 NOTE — Progress Notes (Signed)
24 hour chart audit completed 

## 2022-05-20 NOTE — Care Management Important Message (Signed)
Important Message  Patient Details  Name: [name] MRN: [id number] Date of Birth: 1946-05-17   Medicare Important Message Given:  Yes     Memory Argue 05/20/2022, 1:37 PM

## 2022-05-20 NOTE — Discharge Summary (Signed)
Physician Discharge Summary  [name] DVV:[id number] DOB: [birth year] DOA: 05/06/2022  PCP: [name], MD  Admit date: 05/06/2022 Discharge date: 05/20/2022  Admitted From: home Disposition:  SNF  Recommendations for Outpatient Follow-up:  Follow up with PCP in 1-2 weeks Recommend palliative care to follow-up at SNF Recommend patient takes prednisone 20 mg daily for the next 7 days then 10 mg daily.  He will need a repeat free T3 and free T4 at that time. Recommend endocrinology referral and follow-up in the next 2 to 3 weeks  Home Health: none Equipment/Devices: none  Discharge Condition: stable CODE STATUS: DNR Diet Orders (From admission, onward)     Start     Ordered   05/13/22 1229  Diet Heart Room service appropriate? No; Fluid consistency: Thin  Diet effective now       Question Answer Comment  Room service appropriate? No   Fluid consistency: Thin      05/13/22 1228            HPI: Per admitting MD, [name] is a 76 y.o. male with medical history significant of PAF, HLD, DM2, chronic HFrEF, dementia. Presenting with altered mental status. History is per daughter at bedside. She reports that the patient has had increased confusion over the last several days. He's been hallucinating about objects that are not present. They have been working with their PCP about his progressing dementia for some time now. He was recently started on seroquel a couple days ago.  He seems to have worsened in that time. He has not complained of dyspnea, dysuria, fevers, N/V/D. When his symptoms didn't improve today, his family decided to bring him to the ED for assistance.   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  Ultrasound of the thyroid with low vascularity suggesting type II toxicity, and patient was started on steroids.  Before that he was placed on methimazole, metoprolol, continue. On admission TSH was undetectably low, free T3 was 5 and free T4 was 4.  Improved after the treatment with free T3 3.6 and free T4 3.1.  Placed on prednisone 20 mg, will need 7 days then taper down to 10 mg for 7 more days then free T3 and free T4 rechecked.  It is quite important that he is not on hyperthyroid treatment for prolonged period of time and needs endocrinology referral as soon as possible as an outpatient   Active problems Acute Metabolic Encephalopathy -In the setting of hyperthyroidism, delirium. Hopefully with treating hyperthyroidism confusion will improve over time. MRI ordered, he got agitated and unable to complete MRI. He became more  lethargic  after he received Ativan.  Avoid benzos. Patient has delirium aggravated by hyperthyroidism.  Continue Zyprexa.    PAF-Metoprolol held due to Bradycardia initially. Amiodarone discontinue due to hyperthyroidism. Cardiology consulted.  He was eventually started back on lower dose metoprolol and he is tolerating it well.  Continue anticoagulation  Acute on Chronic combined systolic and diastolic heart failure -New worsening  cardiomyopathy, echo with ejection fraction down to 25 to 30%.  He was diuresed with Lasix, currently appears clinically euvolemic, on room air, continue home diuretics  Acute Hypoxic Respiratory Failure-resolved, currently on room air  Diabetes type 2, Hypoglycemia -continue home medications Dementia with behavioral disturbance-Seroquel has been on hold, he started to have  hallucination after he was started on Seroquel.  Much better on Zyprexa  Hyperlipidemia-Continue with Lipitor Hypertension-continue home medications  Elevated D-dimer -Doppler negative for DVT. Continue with Eliquis, he was already on Eliquis for PAF Hypokalemia-continue to monitor and replace as indicated.  Generalized weakness-PT , OT  AKI on CKD stage IIIa-Cr Peaked to 1.6.  This is likely due to urinary retention, he has a Foley.  Creatinine stabilized.  Started on Flomax, once more mobile go voiding trial Urine retention-foley catheter placed. Started on Flomax.  Voiding trial once more mobile Goals of care-palliative care consulted while hospitalized in the setting of worsening heart failure, significant hypothyroidism, underlying dementia.  Continue palliative discussions as an outpatient.  Sepsis ruled out   Discharge Instructions   Allergies as of 05/20/2022   No Known Allergies      Medication List     STOP taking these medications    amiodarone 200 MG tablet Commonly known as: PACERONE   QUEtiapine 50 MG tablet Commonly known as: SEROquel       TAKE these medications    acetaminophen 325 MG tablet Commonly known as: TYLENOL Take 650 mg by mouth 2 (two) times daily as needed (pain).   atorvastatin 20 MG tablet Commonly known as: LIPITOR TAKE 1 TABLET BY MOUTH EVERY DAY   blood glucose meter kit and supplies Dispense based on patient and insurance preference. Use up to four times daily as directed. (FOR ICD-10 E10.9, E11.9).   Eliquis 5 MG Tabs tablet Generic drug: apixaban TAKE 1 TABLET BY MOUTH TWICE A DAY What changed: how much to take   FLUoxetine 40 MG capsule Commonly known as: PROZAC TAKE 1 CAPSULE BY MOUTH EVERY DAY What changed: how much to take   furosemide 40 MG tablet Commonly known as: LASIX TAKE 1 TABLET BY MOUTH EVERY DAY   metFORMIN 500 MG tablet Commonly known as: GLUCOPHAGE TAKE 1 TABLET BY MOUTH TWICE DAILY WITH A MEAL    methimazole 10 MG tablet Commonly known as: TAPAZOLE Take 1 tablet (10 mg total) by mouth 3 (three) times daily.   metoprolol succinate 25 MG 24 hr tablet Commonly known as: TOPROL-XL Take 1 tablet (25 mg total) by mouth daily. What changed:  medication strength how much to take   Multivitamin Men 50+ Tabs Take 1 tablet by mouth daily.   OLANZapine 2.5 MG tablet Commonly known as: ZYPREXA Take 1 tablet (2.5 mg total) by mouth at bedtime.   predniSONE 20 MG tablet Commonly known as: DELTASONE Take 1 tablet (20 mg total) by mouth daily with breakfast.   spironolactone 25 MG tablet Commonly known as: ALDACTONE TAKE 1 TABLET BY MOUTH EVERY DAY   tamsulosin 0.4 MG Caps capsule Commonly known as: FLOMAX Take 1 capsule (0.4 mg total) by mouth daily.         Consultations: Palliative care PCCM Cardiology   Procedures/Studies:  DG CHEST PORT 1 VIEW  Result Date: 05/19/2022 CLINICAL DATA:  Hypoxemia EXAM: PORTABLE CHEST 1 VIEW COMPARISON:  05/16/2022 FINDINGS: Cardiomegaly. Symmetric hazy appearance of the bilateral chest with cephalized blood flow. Suspect bilateral pleural effusion. No pneumothorax. IMPRESSION: CHF pattern which is progressed Electronically Signed  By: Jorje Guild M.D.   On: 05/19/2022 05:23   US THYROID  Result Date: 05/18/2022 CLINICAL DATA:  Hyperthyroid. EXAM: THYROID ULTRASOUND TECHNIQUE: Ultrasound examination of the thyroid gland and adjacent soft tissues was performed. COMPARISON:  None Available. FINDINGS: Parenchymal Echotexture: Mildly heterogeneous, no evidence of glandular hyperemia Isthmus: Unable to measure Right lobe: 3.7 x 2.2 x 1.9 cm Left lobe: 4.4 x 2.1 x 1.5 cm ________________________________________________________ Estimated total number of nodules >/= 1 cm: 0 Number of spongiform nodules >/=  2 cm not described below (TR1): 0 Number of mixed cystic and solid nodules >/= 1.5 cm not described below (TR2): 0  _________________________________________________________ No discrete nodules are seen within the thyroid gland. No cervical lymphadenopathy. IMPRESSION: Mildly heterogeneous, normal-sized thyroid gland without evidence of glandular hyperemia. No worrisome thyroid nodules identified. The above is in keeping with the ACR TI-RADS recommendations - J Am Coll Radiol 2017;14:587-595. Ruthann Cancer, MD [facility] Radiology Electronically Signed   By: Ruthann Cancer M.D.   On: 05/18/2022 10:43   DG CHEST PORT 1 VIEW  Result Date: 05/16/2022 CLINICAL DATA:  Dyspnea EXAM: PORTABLE CHEST 1 VIEW COMPARISON:  Radiograph 05/11/2022 FINDINGS: Unchanged enlarged cardiac silhouette. There are diffuse interstitial opacities with lower lung predominant alveolar opacities. Mild improvement and airspace opacities since the prior exam. Stable small pleural effusions. No pneumothorax. Bones are unchanged. Thoracic spondylosis. IMPRESSION: Persistent but slightly improved pulmonary edema in comparison to the prior exam. Stable small pleural effusions. Electronically Signed   By: Maurine Simmering M.D.   On: 05/16/2022 09:35   EEG adult  Result Date: 05/12/2022 Lora Havens, MD     05/12/2022  3:55 PM Patient Name: [name] MRN: [id number] Epilepsy Attending: Lora Havens Referring Physician/Provider: Donita Brooks, NP Date: 05/12/2022 Duration: 26.50 mins Patient history: 76yo F with ams. EEG to evaluate for seizure. Level of alertness: Awake AEDs during EEG study: None Technical aspects: This EEG study was done with scalp electrodes positioned according to the 10-20 International system of electrode placement. Electrical activity was reviewed with band pass filter of 1-$RemoveBef'70Hz'GTfXEHSZAd$ , sensitivity of 7 uV/mm, display speed of 45mm/sec with a $Remo'60Hz'jtObe$  notched filter applied as appropriate. EEG data were recorded continuously and digitally stored.  Video monitoring was available and  reviewed as appropriate. Description: The posterior dominant rhythm consists of 8 Hz activity of moderate voltage (25-35 uV) seen predominantly in posterior head regions, symmetric and reactive to eye opening and eye closing. EEG showed intermittent generalized 3 to 6 Hz theta-delta slowing. Hyperventilation and photic stimulation were not performed.   ABNORMALITY - Intermittent slow, generalized IMPRESSION: This study is suggestive of mild diffuse encephalopathy, nonspecific etiology. No seizures or epileptiform discharges were seen throughout the recording. Lora Havens    DG Abd Portable 1V  Result Date: 05/11/2022 CLINICAL DATA:  NG tube placement EXAM: PORTABLE ABDOMEN - 1 VIEW COMPARISON:  Chest 05/11/2022 FINDINGS: No enteric tube is demonstrated within the field of view. Visualized bowel gas pattern is normal with scattered gas in the colon. No small or large bowel distention. Surgical clips consistent with mesh hernia repair. IMPRESSION: No enteric tube demonstrated within the field of view. Electronically Signed   By: Lucienne Capers M.D.   On: 05/11/2022 17:56   DG CHEST PORT 1 VIEW  Result Date: 05/11/2022 CLINICAL DATA:  Encounter for hypoxemia EXAM: PORTABLE CHEST 1 VIEW COMPARISON:  Chest radiograph dated May 06, 2022 FINDINGS: The heart is enlarged. Bilateral lower  lobe hazy opacities concerning for pulmonary edema or infiltrate. Small to moderate bilateral pleural effusions. Thoracic spondylosis. No acute osseous abnormality. IMPRESSION: 1. Stable cardiomegaly. 2. Bilateral perihilar hazy opacities with pleural effusions suggesting pulmonary edema, suggesting worsening of CHF or development of airspace disease, clinical correlation is suggested. Electronically Signed   By: Keane Police D.O.   On: 05/11/2022 14:24   ECHOCARDIOGRAM COMPLETE  Result Date: 05/07/2022    ECHOCARDIOGRAM REPORT   Patient Name:   [name] Date of Exam: 05/07/2022 Medical Rec #:  [id number]          Height:       74.0 in Accession #:    [id number]        Weight:       268.1 lb Date of Birth:  October 03, 1946         BSA:          2.462 m Patient Age:    31 years          BP:           119/66 mmHg Patient Gender: M                 HR:           82 bpm. Exam Location:  Inpatient Procedure: 2D Echo, Cardiac Doppler, Color Doppler and Intracardiac            Opacification Agent                               MODIFIED REPORT: This report was modified by Cherlynn Kaiser MD on 05/07/2022 due to revisions.  Indications:     Elevated Troponin  History:         Patient has prior history of Echocardiogram examinations, most                  recent 04/08/2019. Arrythmias:Atrial Fibrillation; Risk                  Factors:Hypertension, Dyslipidemia and Diabetes.  Sonographer:     Luane School RDCS Referring Phys:  [id number] Jonnie Finner Diagnosing Phys: Cherlynn Kaiser MD  Sonographer Comments: Suboptimal apical window. IMPRESSIONS  1. Left ventricular ejection fraction, by estimation, is 25 to 30%. The left ventricle has severely decreased function. The left ventricle demonstrates global hypokinesis. There is mild left ventricular hypertrophy. Left ventricular diastolic parameters  are consistent with Grade III diastolic dysfunction (restrictive).  2. Right ventricular systolic function is mildly reduced. The right ventricular size is mildly enlarged.  3. Left atrial size was severely dilated.  4. Right atrial size was mild to moderately dilated.  5. The mitral valve is normal in structure. Mild to moderate mitral valve regurgitation. No evidence of mitral stenosis.  6. The aortic valve is tricuspid. There is mild calcification of the aortic valve. There is mild thickening of the aortic valve. Aortic valve regurgitation is mild. No aortic stenosis is present.  7. Aortic dilatation noted. There is borderline dilatation of the aortic root, measuring 41 mm. FINDINGS  Left Ventricle: Left ventricular ejection fraction, by estimation, is  25 to 30%. The left ventricle has severely decreased function. The left ventricle demonstrates global hypokinesis. Definity contrast agent was given IV to delineate the left ventricular endocardial borders. The left ventricular internal cavity size was normal in size. There is mild left ventricular hypertrophy. Left ventricular diastolic parameters are consistent with Grade  III diastolic dysfunction (restrictive). Right Ventricle: The right ventricular size is mildly enlarged. No increase in right ventricular wall thickness. Right ventricular systolic function is mildly reduced. Left Atrium: Left atrial size was severely dilated. Right Atrium: Right atrial size was mild to moderately dilated. Pericardium: Trivial pericardial effusion is present. Mitral Valve: Color scale was reduced for apical images of MR, which may overestimate severity. The mitral valve is normal in structure. Mild to moderate mitral valve regurgitation. No evidence of mitral valve stenosis. Tricuspid Valve: The tricuspid valve is normal in structure. Tricuspid valve regurgitation is mild . No evidence of tricuspid stenosis. Aortic Valve: The aortic valve is tricuspid. There is mild calcification of the aortic valve. There is mild thickening of the aortic valve. Aortic valve regurgitation is mild. Aortic regurgitation PHT measures 454 msec. No aortic stenosis is present. Pulmonic Valve: The pulmonic valve was normal in structure. Pulmonic valve regurgitation is mild. No evidence of pulmonic stenosis. Aorta: Aortic dilatation noted. There is borderline dilatation of the aortic root, measuring 41 mm. Venous: The inferior vena cava was not well visualized. IAS/Shunts: No atrial level shunt detected by color flow Doppler.  LEFT VENTRICLE PLAX 2D LVIDd:         6.90 cm      Diastology LVIDs:         6.20 cm      LV e' medial:    4.57 cm/s LV PW:         1.20 cm      LV E/e' medial:  21.7 LV IVS:        1.30 cm      LV e' lateral:   7.07 cm/s LVOT  diam:     2.80 cm      LV E/e' lateral: 14.0 LV SV:         80 LV SV Index:   33 LVOT Area:     6.16 cm  LV Volumes (MOD) LV vol d, MOD A2C: 192.0 ml LV vol d, MOD A4C: 241.0 ml LV vol s, MOD A2C: 140.0 ml LV vol s, MOD A4C: 192.0 ml LV SV MOD A2C:     52.0 ml LV SV MOD A4C:     241.0 ml LV SV MOD BP:      49.0 ml RIGHT VENTRICLE RV S prime:     9.57 cm/s TAPSE (M-mode): 2.1 cm LEFT ATRIUM              Index         RIGHT ATRIUM           Index LA diam:        5.50 cm  2.23 cm/m    RA Area:     25.40 cm LA Vol (A2C):   249.0 ml 101.15 ml/m  RA Volume:   92.00 ml  37.37 ml/m LA Vol (A4C):   201.0 ml 81.65 ml/m LA Biplane Vol: 236.0 ml 95.87 ml/m  AORTIC VALVE LVOT Vmax:   52.90 cm/s LVOT Vmean:  33.000 cm/s LVOT VTI:    0.130 m AI PHT:      454 msec  AORTA Ao Root diam: 4.10 cm Ao Asc diam:  3.85 cm MR Peak grad:    101.6 mmHg   TRICUSPID VALVE MR Mean grad:    62.0 mmHg    TR Peak grad:   28.1 mmHg MR Vmax:         504.00 cm/s  TR Vmax:  265.00 cm/s MR Vmean:        366.0 cm/s MR PISA:         3.08 cm     SHUNTS MR PISA Eff ROA: 22 mm       Systemic VTI:  0.13 m MR PISA Radius:  0.70 cm      Systemic Diam: 2.80 cm MV E velocity: 99.00 cm/s Cherlynn Kaiser MD Electronically signed by Cherlynn Kaiser MD Signature Date/Time: 05/07/2022/4:11:05 PM    Final (Updated)    VAS [place] LOWER EXTREMITY VENOUS (DVT)  Result Date: 05/07/2022  Lower Venous DVT Study Patient Name:  [name]  Date of Exam:   05/07/2022 Medical Rec #: [id number]          Accession #:    [id number] Date of Birth: Feb 22, 1946          Patient Gender: M Patient Age:   18 years Exam Location:  [facility] Procedure:      VAS [place] LOWER EXTREMITY VENOUS (DVT) Referring Phys: Cherylann Ratel --------------------------------------------------------------------------------  Indications: Edema.  Comparison Study: No previous exam noted. Performing Technologist: Bobetta Lime BS, RVT  Examination Guidelines: A complete evaluation  includes B-mode imaging, spectral Doppler, color Doppler, and power Doppler as needed of all accessible portions of each vessel. Bilateral testing is considered an integral part of a complete examination. Limited examinations for reoccurring indications may be performed as noted. The reflux portion of the exam is performed with the patient in reverse Trendelenburg.  +---------+---------------+---------+-----------+----------+--------------+ RIGHT    CompressibilityPhasicitySpontaneityPropertiesThrombus Aging +---------+---------------+---------+-----------+----------+--------------+ CFV      Full           Yes      Yes                                 +---------+---------------+---------+-----------+----------+--------------+ SFJ      Full                                                        +---------+---------------+---------+-----------+----------+--------------+ FV Prox  Full                                                        +---------+---------------+---------+-----------+----------+--------------+ FV Mid   Full                                                        +---------+---------------+---------+-----------+----------+--------------+ FV DistalFull                                                        +---------+---------------+---------+-----------+----------+--------------+ PFV      Full                                                        +---------+---------------+---------+-----------+----------+--------------+  POP      Full           Yes      Yes                                 +---------+---------------+---------+-----------+----------+--------------+ PTV      Full                                                        +---------+---------------+---------+-----------+----------+--------------+ PERO     Full                                                         +---------+---------------+---------+-----------+----------+--------------+   +---------+---------------+---------+-----------+----------+--------------+ LEFT     CompressibilityPhasicitySpontaneityPropertiesThrombus Aging +---------+---------------+---------+-----------+----------+--------------+ CFV      Full           Yes      Yes                                 +---------+---------------+---------+-----------+----------+--------------+ SFJ      Full                                                        +---------+---------------+---------+-----------+----------+--------------+ FV Prox  Full                                                        +---------+---------------+---------+-----------+----------+--------------+ FV Mid   Full                                                        +---------+---------------+---------+-----------+----------+--------------+ FV DistalFull                                                        +---------+---------------+---------+-----------+----------+--------------+ PFV      Full                                                        +---------+---------------+---------+-----------+----------+--------------+ POP      Full           Yes      Yes                                 +---------+---------------+---------+-----------+----------+--------------+  PTV      Full                                                        +---------+---------------+---------+-----------+----------+--------------+ PERO     Full                                                        +---------+---------------+---------+-----------+----------+--------------+     Summary: BILATERAL: - No evidence of deep vein thrombosis seen in the lower extremities, bilaterally. -No evidence of popliteal cyst, bilaterally.   *See table(s) above for measurements and observations. Electronically signed by Gerarda Fraction on 05/07/2022 at 11:57:39 AM.     Final    CT Head Wo Contrast  Result Date: 05/06/2022 CLINICAL DATA:  Mental status change EXAM: CT HEAD WITHOUT CONTRAST TECHNIQUE: Contiguous axial images were obtained from the base of the skull through the vertex without intravenous contrast. RADIATION DOSE REDUCTION: This exam was performed according to the departmental dose-optimization program which includes automated exposure control, adjustment of the mA and/or kV according to patient size and/or use of iterative reconstruction technique. COMPARISON:  CT head 04/07/2019 FINDINGS: Motion artifact mildly obscures fine detail. Brain: No evidence of acute infarction, hemorrhage, hydrocephalus, extra-axial collection or mass lesion/mass effect. Moderate cerebral volume loss. Ill-defined hypoattenuation within the cerebral white matter is nonspecific but consistent with chronic small vessel ischemic disease. Vascular: No hyperdense vessel or unexpected calcification. Skull: Normal. Negative for fracture or focal lesion. Sinuses/Orbits: Nearly resolved left mastoid effusion. The paranasal sinuses are well aerated. Unremarkable orbits. Other: None. IMPRESSION: No acute intracranial abnormality.  Moderate cerebral atrophy. Electronically Signed   By: Minerva Fester M.D.   On: 05/06/2022 09:58   DG Chest 2 View  Result Date: 05/06/2022 CLINICAL DATA:  Weakness and altered mental status. EXAM: CHEST - 2 VIEW COMPARISON:  Chest x-ray dated April 07, 2019. FINDINGS: Borderline cardiomegaly. Mild pulmonary vascular congestion. Small bilateral pleural effusions with left-greater-than-right lower lobe atelectasis. No pneumothorax. No acute osseous abnormality. IMPRESSION: 1. Mild pulmonary vascular congestion and small bilateral pleural effusions, suggestive of congestive heart failure. Electronically Signed   By: Obie Dredge M.D.   On: 05/06/2022 09:53     Subjective: - no chest pain, shortness of breath, no abdominal pain, nausea or vomiting.    Discharge Exam: BP 119/68 (BP Location: Left Arm)   Pulse (!) 51   Temp 98.1 F (36.7 C) (Oral)   Resp 18   Ht 6\' 2"  (1.88 m)   Wt 115.7 kg   SpO2 92%   BMI 32.75 kg/m   General: Pt is alert, awake, not in acute distress Cardiovascular: RRR, S1/S2 +, no rubs, no gallops Respiratory: CTA bilaterally, no wheezing, no rhonchi Abdominal: Soft, NT, ND, bowel sounds + Extremities: no edema, no cyanosis, chronic venous stasis changes bilateral lower extremities    The results of significant diagnostics from this hospitalization (including imaging, microbiology, ancillary and laboratory) are listed below for reference.     Microbiology: Recent Results (from the past 240 hour(s))  MRSA Next Gen by PCR, Nasal     Status: Abnormal   Collection Time: 05/11/22  2:57 PM  Specimen: Nasal Mucosa; Nasal Swab  Result Value Ref Range Status   MRSA by PCR Next Gen DETECTED (A) NOT DETECTED Final    Comment: CRITICAL RESULT CALLED TO, READ BACK BY AND VERIFIED WITH: BREN,S AT 2034 ON 05/11/22 BY VAZQUEZ,J (NOTE) The GeneXpert MRSA Assay (FDA approved for NASAL specimens only), is one component of a comprehensive MRSA colonization surveillance program. It is not intended to diagnose MRSA infection nor to guide or monitor treatment for MRSA infections. Test performance is not FDA approved in patients less than 28 years old. Performed at [facility], [place] [address], [place], [place] [address]   Culture, blood (Routine X 2) w Reflex to ID Panel     Status: None   Collection Time: 05/11/22  3:13 PM   Specimen: BLOOD  Result Value Ref Range Status   Specimen Description   Final    BLOOD BLOOD LEFT FOREARM Performed at [facility] [address], [place], [place] [address]    Special Requests   Final    IN PEDIATRIC BOTTLE Blood Culture adequate volume Performed at [facility] [address] [place], [place] [address]     Culture   Final    NO GROWTH 5 DAYS Performed at [facility], [place] [address], [place], [place] [address]    Report Status 05/16/2022 FINAL  Final  Culture, blood (Routine X 2) w Reflex to ID Panel     Status: None   Collection Time: 05/11/22  3:14 PM   Specimen: BLOOD  Result Value Ref Range Status   Specimen Description   Final    BLOOD BLOOD LEFT HAND Performed at [facility] [address], [place], [place] [address]    Special Requests   Final    IN PEDIATRIC BOTTLE Blood Culture adequate volume Performed at [facility] [address], [place], [place] [address]    Culture   Final    NO GROWTH 5 DAYS Performed at [facility], [place] [address], [place], [place] [address]    Report Status 05/16/2022 FINAL  Final     Labs: Basic Metabolic Panel: Recent Labs  Lab 05/14/22 0249 05/14/22 2106 05/15/22 0745 05/16/22 0739 05/17/22 0803 05/19/22 0506 05/20/22 0430  NA 143 144 141 142 143 142 141  K 3.3* 3.5 3.7 3.9 3.8 3.3* 3.4*  CL 105 109 106 110 108 107 104  CO2 $Re'27 27 27 23 24 28 29  'KWX$ GLUCOSE 107* 116* 104* 112* 114* 87 93  BUN $Re'23 22 20 16 20 23 23  'XGy$ CREATININE 1.08 1.06 1.02 0.94 1.04 1.09 1.10  CALCIUM 8.8* 8.9 8.6* 9.0 8.9 8.5* 8.6*  MG 2.0 2.3  --   --   --   --   --    Liver Function Tests: No results for input(s): "AST", "ALT", "ALKPHOS", "BILITOT", "PROT", "ALBUMIN" in the last 168 hours. CBC: Recent Labs  Lab 05/14/22 0249 05/15/22 0745 05/16/22 0739 05/17/22 0803 05/19/22 0506  WBC 9.7 9.4 9.2 9.5 8.1  HGB 14.5 14.1 14.4 14.1 13.8  HCT 45.3 45.1 45.9 44.3 44.2  MCV 91.0 92.4 91.3 90.6 93.2  PLT 210 186 199 204 172   CBG: Recent Labs  Lab 05/19/22 0353 05/19/22 0749 05/19/22 1134 05/19/22 1621 05/20/22 0738  GLUCAP 83 76 108* 120* 95   Hgb A1c No results for input(s): "HGBA1C" in the last 72 hours. Lipid Profile No results for input(s): "CHOL", "  HDL", "LDLCALC", "TRIG", "CHOLHDL",  "LDLDIRECT" in the last 72 hours. Thyroid function studies No results for input(s): "TSH", "T4TOTAL", "T3FREE", "THYROIDAB" in the last 72 hours.  Invalid input(s): "FREET3" Urinalysis    Component Value Date/Time   COLORURINE AMBER (A) 05/06/2022 1150   APPEARANCEUR CLEAR 05/06/2022 1150   LABSPEC 1.017 05/06/2022 1150   PHURINE 5.0 05/06/2022 1150   GLUCOSEU NEGATIVE 05/06/2022 1150   GLUCOSEU NEGATIVE 07/14/2021 1046   [place] 05/06/2022 1150   HGBUR large 10/23/2007 1455   BILIRUBINUR NEGATIVE 05/06/2022 1150   BILIRUBINUR N 03/02/2018 1655   KETONESUR NEGATIVE 05/06/2022 1150   PROTEINUR 100 (A) 05/06/2022 1150   UROBILINOGEN 0.2 07/14/2021 1046   NITRITE POSITIVE (A) 05/06/2022 1150   LEUKOCYTESUR TRACE (A) 05/06/2022 1150    FURTHER DISCHARGE INSTRUCTIONS:   Get Medicines reviewed and adjusted: Please take all your medications with you for your next visit with your Primary MD   Laboratory/radiological data: Please request your Primary MD to go over all hospital tests and procedure/radiological results at the follow up, please ask your Primary MD to get all Hospital records sent to his/her office.   In some cases, they will be blood work, cultures and biopsy results pending at the time of your discharge. Please request that your primary care M.D. goes through all the records of your hospital data and follows up on these results.   Also Note the following: If you experience worsening of your admission symptoms, develop shortness of breath, life threatening emergency, suicidal or homicidal thoughts you must seek medical attention immediately by calling 911 or calling your MD immediately  if symptoms less severe.   You must read complete instructions/literature along with all the possible adverse reactions/side effects for all the Medicines you take and that have been prescribed to you. Take any new Medicines after you have completely understood and accpet all the  possible adverse reactions/side effects.    Do not drive when taking Pain medications or sleeping medications (Benzodaizepines)   Do not take more than prescribed Pain, Sleep and Anxiety Medications. It is not advisable to combine anxiety,sleep and pain medications without talking with your primary care practitioner   Special Instructions: If you have smoked or chewed Tobacco  in the last 2 yrs please stop smoking, stop any regular Alcohol  and or any Recreational drug use.   Wear Seat belts while driving.   Please note: You were cared for by a hospitalist during your hospital stay. Once you are discharged, your primary care physician will handle any further medical issues. Please note that NO REFILLS for any discharge medications will be authorized once you are discharged, as it is imperative that you return to your primary care physician (or establish a relationship with a primary care physician if you do not have one) for your post hospital discharge needs so that they can reassess your need for medications and monitor your lab values.  Time coordinating discharge: 40 minutes  SIGNED:  Marzetta Board, MD, PhD 05/20/2022, 9:18 AM

## 2022-05-21 DIAGNOSIS — E0590 Thyrotoxicosis, unspecified without thyrotoxic crisis or storm: Secondary | ICD-10-CM | POA: Diagnosis not present

## 2022-05-21 LAB — GLUCOSE, CAPILLARY
Glucose-Capillary: 85 mg/dL (ref 70–99)
Glucose-Capillary: 127 mg/dL — ABNORMAL HIGH (ref 70–99)
Glucose-Capillary: 102 mg/dL — ABNORMAL HIGH (ref 70–99)
Glucose-Capillary: 104 mg/dL — ABNORMAL HIGH (ref 70–99)

## 2022-05-21 NOTE — Progress Notes (Signed)
PROGRESS NOTE  [name] KGM:[id number] DOB: Feb 18, 1946 DOA: 05/06/2022 PCP: [name], MD   LOS: 15 days   Brief Narrative / Interim history: 76 year old with past medical history significant for PAF on Eliquis, hyperlipidemia, diabetes type 2, chronic heart failure reduced ejection fraction 45%, dementia.  Patient presented with altered mental status, increased confusion over the last several days.  He has been having hallucination.  Patient was a started on Seroquel a couple of days prior to admission.  He has been following with his PCP for worsening dementia.  Evaluation in the ED GL hypothyroidism, with a TSH of 0.01, free T4 at 4.0, free T3 at 5.  He was started on methimazole and amiodarone was discontinued.  Patient had some SVT , 2D echo showed lower ejection fraction to 25 to 30%.  Global hypokinesis.  Cardiology has been consulted. He has been confused, and agitated.  MRI was ordered 7/25th.  Patient received IV Ativan, he became more lethargic 7/26 after he received ativan  from prior night. ABG showed acute hypoxic, hypercapnic respiratory failure. He was transfer to ICU. CCM consulted. Patient was placed on supplemental oxygen. His mental status  improved, his respiratory failure  improved. [facility] stay due to delirium, encephalopathy, acute respiratory failure.   Subjective / 24h Interval events: Alert this morning. Very agitated last night requiring restraints.  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  Ultrasound of the thyroid with low vascularity suggesting type II toxicity, and patient was started on steroids.  Before that he was placed on methimazole, metoprolol, continue. On admission TSH was undetectably low, free T3 was 5 and free T4 was 4.  Improved after the treatment with free T3 3.6 and free T4 3.1.  He was also placed on prednisone, however became more confused, after discussing with family prednisone will be discontinued today.  Continue methimazole   Active problems Acute Metabolic Encephalopathy -In the setting of hyperthyroidism, delirium. Hopefully with treating hyperthyroidism confusion will improve over time. MRI ordered, he got agitated and unable to complete MRI. He became more  lethargic  after he received Ativan.  Avoid benzos.  He became significantly worse after initiation of steroid yesterday.  Continues to remain much more altered than before steroids, discontinue prednisone today.  Discussed with the daughter at bedside risks/benefits about not using prednisone in type II AIT, and she agrees with discontinuing to allow him more mental clarity and better chance at recovery  PAF-Metoprolol held due to Bradycardia initially. Amiodarone discontinue due to hyperthyroidism. Cardiology consulted.  He was eventually started back on lower dose metoprolol and he is tolerating it well.  Continue anticoagulation   Acute on Chronic combined systolic and diastolic heart failure -New worsening cardiomyopathy, echo with ejection fraction down to 25 to 30%.  He was diuresed with Lasix, currently appears clinically euvolemic, on room air, continue home diuretics   Acute Hypoxic Respiratory Failure-resolved, currently on room air   Diabetes type 2, Hypoglycemia -continue home medications  Dementia with behavioral disturbance-Seroquel has  been on hold, he started to have hallucination after he was started on Seroquel.  Much better on  Zyprexa   Hyperlipidemia-Continue with Lipitor  Hypertension-continue home medications   Elevated D-dimer -Doppler negative for DVT. Continue with Eliquis, he was already on Eliquis for PAF  Hypokalemia-continue to monitor and replace as indicated.   Generalized weakness-PT , OT   AKI on CKD stage IIIa-Cr Peaked to 1.6.  This is likely due to urinary retention, he has a Foley.  Creatinine stabilized.  Started on Flomax, once more mobile go voiding trial  Urine retention-Started on Flomax.   Goals of care-palliative care consulted while hospitalized in the setting of worsening heart failure, significant hypothyroidism, underlying dementia.  Continue palliative discussions as an outpatient.    Scheduled Meds:  apixaban  5 mg Oral BID   atorvastatin  20 mg Oral Daily   calcium carbonate  1 tablet Oral TID WC   Chlorhexidine Gluconate Cloth  6 each Topical Daily   cholestyramine light  4 g Oral QID   feeding supplement  1 Container Oral Q24H   feeding supplement  237 mL Oral TID BM   FLUoxetine  40 mg Oral Daily   insulin aspart  0-15 Units Subcutaneous TID AC & HS   lactulose  30 g Oral Daily   methimazole  10 mg Oral TID   metoprolol succinate  25 mg Oral Daily   multivitamin with minerals  1 tablet Oral Daily   nicotine  14 mg Transdermal Daily   OLANZapine  5 mg Oral Daily   senna-docusate  1 tablet Oral BID   spironolactone  25 mg Oral Daily   tamsulosin  0.4 mg Oral Daily   torsemide  20 mg Oral Daily   Continuous Infusions: PRN Meds:.acetaminophen **OR** acetaminophen, haloperidol lactate, OLANZapine, mouth rinse, mouth rinse  Diet Orders (From admission, onward)     Start     Ordered   05/13/22 1229  Diet Heart Room service appropriate? No; Fluid consistency: Thin  Diet effective now       Question Answer Comment  Room service appropriate? No   Fluid consistency: Thin      05/13/22 1228            DVT prophylaxis:  apixaban (ELIQUIS) tablet 5 mg   Lab  Results  Component Value Date   PLT 172 05/19/2022      Code Status: DNR  Family Communication: no family at bedside   Status is: Inpatient  Remains inpatient appropriate because: severity of illness  Level of care: Telemetry   Objective: Vitals:   05/20/22 0533 05/20/22 1336 05/20/22 2030 05/21/22 0546  BP: 119/68 118/68 102/70 135/76  Pulse: (!) 51 75 86 66  Resp: '18 18 18 18  '$ Temp: 98.1 F (36.7 C) 98.4 F (36.9 C) 98.6 F (37 C) 98.6 F (37 C)  TempSrc: Oral Oral Oral Axillary  SpO2: 92% 98% 100% 99%  Weight:      Height:        Intake/Output Summary (Last 24 hours) at 05/21/2022 1304 Last data filed at 05/21/2022 1230 Gross per 24 hour  Intake 240 ml  Output 850 ml  Net -610 ml    Wt Readings from Last 3 Encounters:  05/20/22 115.7 kg  05/04/22 120.7 kg  11/26/21 122.5 kg    Examination: Constitutional: NAD Eyes: lids and conjunctivae normal, no scleral icterus ENMT: mmm Neck: normal, supple Respiratory: clear to auscultation bilaterally, no wheezing, no crackles.  Cardiovascular: Regular  rate and rhythm, no murmurs / rubs / gallops. No LE edema. Abdomen: soft, no distention, no tenderness. Bowel sounds positive.  Skin: no rashes Neurologic: no focal deficits, equal strength  Data Reviewed: I have independently reviewed following labs and imaging studies   CBC Recent Labs  Lab 05/15/22 0745 05/16/22 0739 05/17/22 0803 05/19/22 0506  WBC 9.4 9.2 9.5 8.1  HGB 14.1 14.4 14.1 13.8  HCT 45.1 45.9 44.3 44.2  PLT 186 199 204 172  MCV 92.4 91.3 90.6 93.2  MCH 28.9 28.6 28.8 29.1  MCHC 31.3 31.4 31.8 31.2  RDW 15.6* 15.6* 15.4 15.3     Recent Labs  Lab 05/14/22 2106 05/15/22 0745 05/16/22 0739 05/17/22 0803 05/19/22 0506 05/20/22 0430  NA 144 141 142 143 142 141  K 3.5 3.7 3.9 3.8 3.3* 3.4*  CL 109 106 110 108 107 104  CO2 '27 27 23 24 28 29  '$ GLUCOSE 116* 104* 112* 114* 87 93  BUN '22 20 16 20 23 23  '$ CREATININE 1.06 1.02 0.94 1.04 1.09  1.10  CALCIUM 8.9 8.6* 9.0 8.9 8.5* 8.6*  MG 2.3  --   --   --   --   --   AMMONIA 25  --   --   --   --   --      ------------------------------------------------------------------------------------------------------------------ No results for input(s): "CHOL", "HDL", "LDLCALC", "TRIG", "CHOLHDL", "LDLDIRECT" in the last 72 hours.  Lab Results  Component Value Date   HGBA1C 5.4 07/14/2021   ------------------------------------------------------------------------------------------------------------------ No results for input(s): "TSH", "T4TOTAL", "T3FREE", "THYROIDAB" in the last 72 hours.  Invalid input(s): "FREET3"  Cardiac Enzymes No results for input(s): "CKMB", "TROPONINI", "MYOGLOBIN" in the last 168 hours.  Invalid input(s): "CK"  ------------------------------------------------------------------------------------------------------------------    Component Value Date/Time   BNP 1,314.2 (H) 05/12/2022 1253   CBG: Recent Labs  Lab 05/20/22 1145 05/20/22 1600 05/20/22 2026 05/21/22 0738 05/21/22 1145  GLUCAP 104* 122* 122* 85 127*    Recent Results (from the past 240 hour(s))  MRSA Next Gen by PCR, Nasal     Status: Abnormal   Collection Time: 05/11/22  2:57 PM   Specimen: Nasal Mucosa; Nasal Swab  Result Value Ref Range Status   MRSA by PCR Next Gen DETECTED (A) NOT DETECTED Final    Comment: CRITICAL RESULT CALLED TO, READ BACK BY AND VERIFIED WITH: BREN,S AT 2034 ON 05/11/22 BY VAZQUEZ,J (NOTE) The GeneXpert MRSA Assay (FDA approved for NASAL specimens only), is one component of a comprehensive MRSA colonization surveillance program. It is not intended to diagnose MRSA infection nor to guide or monitor treatment for MRSA infections. Test performance is not FDA approved in patients less than 88 years old. Performed at [facility], [place] [address], [place], [place] [address]   Culture, blood (Routine X 2) w Reflex to ID Panel     Status:  None   Collection Time: 05/11/22  3:13 PM   Specimen: BLOOD  Result Value Ref Range Status   Specimen Description   Final    BLOOD BLOOD LEFT FOREARM Performed at [facility] [address], [place], [place] [address]    Special Requests   Final    IN PEDIATRIC BOTTLE Blood Culture adequate volume Performed at [facility] [address], [place],  [address]    Culture   Final    NO GROWTH 5 DAYS Performed at [facility], [place] [address], [place],  [address]  Report Status 05/16/2022 FINAL  Final  Culture, blood (Routine X 2) w Reflex to ID Panel     Status: None   Collection Time: 05/11/22  3:14 PM   Specimen: BLOOD  Result Value Ref Range Status   Specimen Description   Final    BLOOD BLOOD LEFT HAND Performed at [facility] [address], [place], [place] [address]    Special Requests   Final    IN PEDIATRIC BOTTLE Blood Culture adequate volume Performed at [facility] [address] [place], [place] [address]    Culture   Final    NO GROWTH 5 DAYS Performed at [facility], [place] [address] [place],  [address]    Report Status 05/16/2022 FINAL  Final     Radiology Studies: No results found.   Marzetta Board, MD, PhD [facility]  Between 7 am - 7 pm I am available, please contact me via Amion (for emergencies) or Securechat (non urgent messages)  Between 7 pm - 7 am I am not available, please contact night coverage MD/APP via Amion

## 2022-05-21 NOTE — Progress Notes (Signed)
Physical Therapy Treatment Patient Details Name: [name] MRN: [id number] DOB: 10-Mar-1946 Today's Date: 05/21/2022   History of Present Illness 76 y.o. male with medical history significant of PAF on Eliquis, HLD, DM2, chronic HFrEF, dementia. Presenting with altered mental status. Dx of hyperthyroid, metabolic encephalopathy.    PT Comments    Cooperative with session today, but confused.  +2 MAX A to recliner. Con't to recommend SNF.   Recommendations for follow up therapy are one component of a multi-disciplinary discharge planning process, led by the attending physician.  Recommendations may be updated based on patient status, additional functional criteria and insurance authorization.  Follow Up Recommendations  Skilled nursing-short term rehab (<3 hours/day) Can patient physically be transported by private vehicle: No   Assistance Recommended at Discharge Frequent or constant Supervision/Assistance  Patient can return home with the following Two people to help with walking and/or transfers;Two people to help with bathing/dressing/bathroom;Assistance with cooking/housework;Assist for transportation;Help with stairs or ramp for entrance   Equipment Recommendations  None recommended by PT    Recommendations for Other Services       Precautions / Restrictions Precautions Precautions: Fall Precaution Comments: Hx Dementia can become combative and argumentative.  Does [place]. Restrictions Weight Bearing Restrictions: No     Mobility  Bed Mobility Overal bed mobility: Needs Assistance Bed Mobility: Supine to Sit     Supine to sit: Mod assist, HOB elevated          Transfers Overall transfer level: Needs assistance Equipment used: Rolling walker (2 wheels) Transfers: Sit to/from Stand, Bed to chair/wheelchair/BSC Sit to Stand: Max assist, +2 physical assistance, From elevated surface   Step pivot transfers: Max assist, +2 physical assistance        General transfer comment: Cues for positioning within RW and for step sequencing    Ambulation/Gait                   Stairs             Wheelchair Mobility    Modified Rankin (Stroke Patients Only)       Balance           Standing balance support: Reliant on assistive device for balance Standing balance-Leahy Scale: Poor                              Cognition Arousal/Alertness: Awake/alert Behavior During Therapy: WFL for tasks assessed/performed Overall Cognitive Status: History of cognitive impairments - at baseline                   Orientation Level: Disoriented to, Time, Situation, Place       Safety/Judgement: Decreased awareness of deficits, Decreased awareness of safety Awareness: Intellectual   General Comments: confused conversation at times, but pleasant. no agitation noted, but seen in the AM.        Exercises      General Comments        Pertinent Vitals/Pain Pain Assessment Pain Assessment: No/denies pain    Home Living                          Prior Function            PT Goals (current goals can now be found in the care plan section) Acute Rehab PT Goals Patient Stated Goal: No goals expressed PT Goal Formulation: Patient unable to participate in goal  setting Time For Goal Achievement: 05/26/22 Potential to Achieve Goals: Fair Progress towards PT goals: Progressing toward goals    Frequency    Min 2X/week      PT Plan Current plan remains appropriate    Co-evaluation              AM-PAC PT "6 Clicks" Mobility   Outcome Measure  Help needed turning from your back to your side while in a flat bed without using bedrails?: A Lot Help needed moving from lying on your back to sitting on the side of a flat bed without using bedrails?: A Lot Help needed moving to and from a bed to a chair (including a wheelchair)?: A Lot Help needed standing up from a chair using your arms  (e.g., wheelchair or bedside chair)?: A Lot Help needed to walk in hospital room?: Total Help needed climbing 3-5 steps with a railing? : Total 6 Click Score: 10    End of Session Equipment Utilized During Treatment: Gait belt Activity Tolerance: Patient tolerated treatment well Patient left: in chair;with call bell/phone within reach;with chair alarm set;with nursing/sitter in room Nurse Communication: Mobility status;Need for lift equipment PT Visit Diagnosis: Difficulty in walking, not elsewhere classified (R26.2)     Time: 1761-6073 PT Time Calculation (min) (ACUTE ONLY): 12 min  Charges:  $Therapeutic Activity: 8-22 mins                     Amil Moseman L. Tamala Julian, [place]  05/21/2022    Galen Manila 05/21/2022, 12:50 PM

## 2022-05-22 DIAGNOSIS — E0590 Thyrotoxicosis, unspecified without thyrotoxic crisis or storm: Secondary | ICD-10-CM | POA: Diagnosis not present

## 2022-05-22 LAB — GLUCOSE, CAPILLARY
Glucose-Capillary: 101 mg/dL — ABNORMAL HIGH (ref 70–99)
Glucose-Capillary: 106 mg/dL — ABNORMAL HIGH (ref 70–99)
Glucose-Capillary: 179 mg/dL — ABNORMAL HIGH (ref 70–99)
Glucose-Capillary: 98 mg/dL (ref 70–99)

## 2022-05-22 MED ORDER — OLANZAPINE 2.5 MG PO TABS
2.5000 mg | ORAL_TABLET | Freq: Every day | ORAL | Status: DC
  Administered 2022-05-22: 2.5 mg via ORAL
  Filled 2022-05-22 (×2): qty 1

## 2022-05-22 NOTE — Progress Notes (Signed)
PROGRESS NOTE  SHILOH SWOPES YQM:[id number] DOB: [birth year] DOA: 05/06/2022 PCP: [name], MD   LOS: 16 days   Brief Narrative / Interim history: 76 year old with past medical history significant for PAF on Eliquis, hyperlipidemia, diabetes type 2, chronic heart failure reduced ejection fraction 45%, dementia.  Patient presented with altered mental status, increased confusion over the last several days.  He has been having hallucination.  Patient was a started on Seroquel a couple of days prior to admission.  He has been following with his PCP for worsening dementia.  Evaluation in the ED GL hypothyroidism, with a TSH of 0.01, free T4 at 4.0, free T3 at 5.  He was started on methimazole and amiodarone was discontinued.  Patient had some SVT , 2D echo showed lower ejection fraction to 25 to 30%.  Global hypokinesis.  Cardiology has been consulted. He has been confused, and agitated.  MRI was ordered 7/25th.  Patient received IV Ativan, he became more lethargic 7/26 after he received ativan  from prior night. ABG showed acute hypoxic, hypercapnic respiratory failure. He was transfer to ICU. CCM consulted. Patient was placed on supplemental oxygen. His mental status  improved, his respiratory failure  improved. [facility] stay due to delirium, encephalopathy, acute respiratory failure.   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  Ultrasound of the thyroid with low vascularity suggesting type II toxicity, and patient was started on steroids.  Before that he was placed on methimazole, metoprolol, continue. On admission TSH was undetectably low, free T3 was 5 and free T4 was 4.  Improved after the treatment with free T3 3.6 and free T4 3.1.  He was also placed on prednisone, however became more confused, after discussing with family prednisone will be discontinued today.  Continue methimazole   Active problems Acute Metabolic Encephalopathy -In the setting of hyperthyroidism, delirium. Hopefully with treating hyperthyroidism confusion will improve over time. MRI ordered, he got agitated and unable to complete MRI. He became more  lethargic  after he received Ativan.  Avoid benzos.  He became significantly worse after initiation of steroid yesterday.  Continues to remain much more altered than before steroids, discontinue prednisone today.  Discussed with the daughter at bedside risks/benefits about not using prednisone in type II AIT, and she agrees with discontinuing to allow him more mental clarity and better chance at recovery  PAF-Metoprolol held due to Bradycardia initially. Amiodarone discontinue due to hyperthyroidism. Cardiology consulted.  He was eventually started back on lower dose metoprolol and he is tolerating it well.  Continue anticoagulation   Acute on Chronic combined systolic and diastolic heart failure -New worsening cardiomyopathy, echo with ejection fraction down to 25 to 30%.  He was diuresed with Lasix, currently appears clinically euvolemic, on room air, continue home diuretics   Acute Hypoxic Respiratory Failure-resolved, currently on room air   Diabetes type 2, Hypoglycemia -continue home medications  Dementia with behavioral disturbance-Seroquel has been on hold, he started to  have hallucination after he was started on Seroquel.  Much better on Zyprexa, dose was increased 2 days ago when he was  agitated, but now he is sleepy, decrease back the dose to 2.5 mg  Hyperlipidemia-Continue with Lipitor  Hypertension-continue home medications   Elevated D-dimer -Doppler negative for DVT. Continue with Eliquis, he was already on Eliquis for PAF  Hypokalemia-continue to monitor and replace as indicated.   Generalized weakness-PT , OT   AKI on CKD stage IIIa-Cr Peaked to 1.6.  This is likely due to urinary retention, he has a Foley.  Creatinine stabilized.  Started on Flomax, once more mobile go voiding trial  Urine retention-Started on Flomax.   Goals of care-palliative care consulted while hospitalized in the setting of worsening heart failure, significant hypothyroidism, underlying dementia.  Continue palliative discussions as an outpatient.    Scheduled Meds:  apixaban  5 mg Oral BID   atorvastatin  20 mg Oral Daily   calcium carbonate  1 tablet Oral TID WC   Chlorhexidine Gluconate Cloth  6 each Topical Daily   cholestyramine light  4 g Oral QID   feeding supplement  1 Container Oral Q24H   feeding supplement  237 mL Oral TID BM   FLUoxetine  40 mg Oral Daily   insulin aspart  0-15 Units Subcutaneous TID AC & HS   lactulose  30 g Oral Daily   methimazole  10 mg Oral TID   metoprolol succinate  25 mg Oral Daily   multivitamin with minerals  1 tablet Oral Daily   nicotine  14 mg Transdermal Daily   OLANZapine  5 mg Oral Daily   senna-docusate  1 tablet Oral BID   spironolactone  25 mg Oral Daily   tamsulosin  0.4 mg Oral Daily   torsemide  20 mg Oral Daily   Continuous Infusions: PRN Meds:.acetaminophen **OR** acetaminophen, haloperidol lactate, OLANZapine, mouth rinse, mouth rinse  Diet Orders (From admission, onward)     Start     Ordered   05/13/22 1229  Diet Heart Room service appropriate? No; Fluid consistency: Thin  Diet effective now       Question Answer Comment  Room service appropriate? No   Fluid consistency: Thin      05/13/22 1228             DVT prophylaxis:  apixaban (ELIQUIS) tablet 5 mg   Lab Results  Component Value Date   PLT 172 05/19/2022      Code Status: DNR  Family Communication: no family at bedside   Status is: Inpatient  Remains inpatient appropriate because: severity of illness  Level of care: Telemetry   Objective: Vitals:   05/21/22 0546 05/21/22 1340 05/21/22 2003 05/22/22 0457  BP: 135/76 (!) 112/93 126/85 (!) 132/94  Pulse: 66 75 73 81  Resp: '18 20 20 18  '$ Temp: 98.6 F (37 C) (!) 97.5 F (36.4 C) 97.9 F (36.6 C) (!) 97.4 F (36.3 C)  TempSrc: Axillary Oral Oral Oral  SpO2: 99% 95% 96% 94%  Weight:      Height:        Intake/Output Summary (Last 24 hours) at 05/22/2022 1211 Last data filed at 05/22/2022 0500 Gross per 24 hour  Intake 240 ml  Output 2000 ml  Net -1760 ml    Wt Readings from Last 3 Encounters:  05/20/22 115.7 kg  05/04/22 120.7 kg  11/26/21 122.5 kg    Examination: Constitutional: NAD Eyes: lids and conjunctivae normal, no scleral  icterus ENMT: mmm Neck: normal, supple Respiratory: clear to auscultation bilaterally, no wheezing, no crackles. Normal respiratory effort.  Cardiovascular: Regular rate and rhythm, no murmurs / rubs / gallops. No LE edema. Abdomen: soft, no distention, no tenderness. Bowel sounds positive.  Skin: no rashes Neurologic: no focal deficits, equal strength  Data Reviewed: I have independently reviewed following labs and imaging studies   CBC Recent Labs  Lab 05/16/22 0739 05/17/22 0803 05/19/22 0506  WBC 9.2 9.5 8.1  HGB 14.4 14.1 13.8  HCT 45.9 44.3 44.2  PLT 199 204 172  MCV 91.3 90.6 93.2  MCH 28.6 28.8 29.1  MCHC 31.4 31.8 31.2  RDW 15.6* 15.4 15.3     Recent Labs  Lab 05/16/22 0739 05/17/22 0803 05/19/22 0506 05/20/22 0430  NA 142 143 142 141  K 3.9 3.8 3.3* 3.4*  CL 110 108 107 104  CO2 '23 24 28 29  '$ GLUCOSE 112* 114* 87 93  BUN '16 20 23 23  '$ CREATININE 0.94 1.04 1.09 1.10  CALCIUM 9.0 8.9 8.5* 8.6*      ------------------------------------------------------------------------------------------------------------------ No results for input(s): "CHOL", "HDL", "LDLCALC", "TRIG", "CHOLHDL", "LDLDIRECT" in the last 72 hours.  Lab Results  Component Value Date   HGBA1C 5.4 07/14/2021   ------------------------------------------------------------------------------------------------------------------ No results for input(s): "TSH", "T4TOTAL", "T3FREE", "THYROIDAB" in the last 72 hours.  Invalid input(s): "FREET3"  Cardiac Enzymes No results for input(s): "CKMB", "TROPONINI", "MYOGLOBIN" in the last 168 hours.  Invalid input(s): "CK"  ------------------------------------------------------------------------------------------------------------------    Component Value Date/Time   BNP 1,314.2 (H) 05/12/2022 1253   CBG: Recent Labs  Lab 05/21/22 1145 05/21/22 1618 05/21/22 2114 05/22/22 0742 05/22/22 1102  GLUCAP 127* 102* 104* 101* 106*    No results found for this or any previous visit (from the past 240 hour(s)).    Radiology Studies: No results found.   Marzetta Board, MD, PhD [facility]  Between 7 am - 7 pm I am available, please contact me via Amion (for emergencies) or Securechat (non urgent messages)  Between 7 pm - 7 am I am not available, please contact night coverage MD/APP via Amion

## 2022-05-22 NOTE — Progress Notes (Signed)
Cardiac monitoring ordered for pt. Pt was not on the monitor. RN explained to pt that telemetry was ordered and pt agreeable to wear monitor. RN applied cardiac monitor. Will continue to monitor.

## 2022-05-22 NOTE — Plan of Care (Signed)
Pt stood at bedside this shift with 2 assist. Telesitter initiated, no safety issues at this time. Problem: Education: Goal: Knowledge of General Education information will improve Description: Including pain rating scale, medication(s)/side effects and non-pharmacologic comfort measures Outcome: Progressing   Problem: Health Behavior/Discharge Planning: Goal: Ability to manage health-related needs will improve Outcome: Progressing   Problem: Clinical Measurements: Goal: Ability to maintain clinical measurements within normal limits will improve Outcome: Progressing Goal: Will remain free from infection Outcome: Progressing Goal: Diagnostic test results will improve Outcome: Progressing Goal: Respiratory complications will improve Outcome: Progressing Goal: Cardiovascular complication will be avoided Outcome: Progressing   Problem: Activity: Goal: Risk for activity intolerance will decrease Outcome: Progressing   Problem: Nutrition: Goal: Adequate nutrition will be maintained Outcome: Progressing   Problem: Coping: Goal: Level of anxiety will decrease Outcome: Progressing   Problem: Elimination: Goal: Will not experience complications related to bowel motility Outcome: Progressing Goal: Will not experience complications related to urinary retention Outcome: Progressing   Problem: Pain Managment: Goal: General experience of comfort will improve Outcome: Progressing   Problem: Safety: Goal: Ability to remain free from injury will improve Outcome: Progressing   Problem: Skin Integrity: Goal: Risk for impaired skin integrity will decrease Outcome: Progressing   Problem: Education: Goal: Ability to describe self-care measures that may prevent or decrease complications (Diabetes Survival Skills Education) will improve Outcome: Progressing Goal: Individualized Educational Video(s) Outcome: Progressing   Problem: Coping: Goal: Ability to adjust to condition or  change in health will improve Outcome: Progressing   Problem: Fluid Volume: Goal: Ability to maintain a balanced intake and output will improve Outcome: Progressing   Problem: Health Behavior/Discharge Planning: Goal: Ability to identify and utilize available resources and services will improve Outcome: Progressing Goal: Ability to manage health-related needs will improve Outcome: Progressing   Problem: Metabolic: Goal: Ability to maintain appropriate glucose levels will improve Outcome: Progressing   Problem: Nutritional: Goal: Maintenance of adequate nutrition will improve Outcome: Progressing Goal: Progress toward achieving an optimal weight will improve Outcome: Progressing   Problem: Skin Integrity: Goal: Risk for impaired skin integrity will decrease Outcome: Progressing   Problem: Tissue Perfusion: Goal: Adequacy of tissue perfusion will improve Outcome: Progressing   Problem: Education: Goal: Ability to describe self-care measures that may prevent or decrease complications (Diabetes Survival Skills Education) will improve Outcome: Progressing Goal: Individualized Educational Video(s) Outcome: Progressing   Problem: Coping: Goal: Ability to adjust to condition or change in health will improve Outcome: Progressing   Problem: Fluid Volume: Goal: Ability to maintain a balanced intake and output will improve Outcome: Progressing   Problem: Health Behavior/Discharge Planning: Goal: Ability to identify and utilize available resources and services will improve Outcome: Progressing Goal: Ability to manage health-related needs will improve Outcome: Progressing   Problem: Metabolic: Goal: Ability to maintain appropriate glucose levels will improve Outcome: Progressing   Problem: Nutritional: Goal: Maintenance of adequate nutrition will improve Outcome: Progressing Goal: Progress toward achieving an optimal weight will improve Outcome: Progressing   Problem:  Skin Integrity: Goal: Risk for impaired skin integrity will decrease Outcome: Progressing   Problem: Tissue Perfusion: Goal: Adequacy of tissue perfusion will improve Outcome: Progressing   Problem: Safety: Goal: Non-violent Restraint(s) Outcome: Progressing

## 2022-05-22 NOTE — Progress Notes (Signed)
Pt refuses telemetry by continually removing the telemetry box. Telemetry box returned on 8/4, tele-monitoring made aware.

## 2022-05-22 NOTE — Plan of Care (Signed)
  Problem: Education: Goal: Knowledge of General Education information will improve Description: Including pain rating scale, medication(s)/side effects and non-pharmacologic comfort measures Outcome: Progressing   Problem: Health Behavior/Discharge Planning: Goal: Ability to manage health-related needs will improve Outcome: Progressing   Problem: Clinical Measurements: Goal: Ability to maintain clinical measurements within normal limits will improve Outcome: Progressing Goal: Will remain free from infection Outcome: Progressing Goal: Diagnostic test results will improve Outcome: Progressing Goal: Respiratory complications will improve Outcome: Progressing Goal: Cardiovascular complication will be avoided Outcome: Progressing   Problem: Activity: Goal: Risk for activity intolerance will decrease Outcome: Progressing   Problem: Nutrition: Goal: Adequate nutrition will be maintained Outcome: Progressing   Problem: Coping: Goal: Level of anxiety will decrease Outcome: Progressing   Problem: Elimination: Goal: Will not experience complications related to bowel motility Outcome: Progressing Goal: Will not experience complications related to urinary retention Outcome: Progressing   Problem: Pain Managment: Goal: General experience of comfort will improve Outcome: Progressing   Problem: Safety: Goal: Ability to remain free from injury will improve Outcome: Progressing   Problem: Skin Integrity: Goal: Risk for impaired skin integrity will decrease Outcome: Progressing   Problem: Education: Goal: Ability to describe self-care measures that may prevent or decrease complications (Diabetes Survival Skills Education) will improve Outcome: Progressing Goal: Individualized Educational Video(s) Outcome: Progressing   Problem: Coping: Goal: Ability to adjust to condition or change in health will improve Outcome: Progressing   Problem: Fluid Volume: Goal: Ability to  maintain a balanced intake and output will improve Outcome: Progressing   Problem: Health Behavior/Discharge Planning: Goal: Ability to identify and utilize available resources and services will improve Outcome: Progressing Goal: Ability to manage health-related needs will improve Outcome: Progressing   Problem: Metabolic: Goal: Ability to maintain appropriate glucose levels will improve Outcome: Progressing   Problem: Nutritional: Goal: Maintenance of adequate nutrition will improve Outcome: Progressing Goal: Progress toward achieving an optimal weight will improve Outcome: Progressing   Problem: Skin Integrity: Goal: Risk for impaired skin integrity will decrease Outcome: Progressing   Problem: Tissue Perfusion: Goal: Adequacy of tissue perfusion will improve Outcome: Progressing   Problem: Education: Goal: Ability to describe self-care measures that may prevent or decrease complications (Diabetes Survival Skills Education) will improve Outcome: Progressing Goal: Individualized Educational Video(s) Outcome: Progressing   Problem: Coping: Goal: Ability to adjust to condition or change in health will improve Outcome: Progressing   Problem: Fluid Volume: Goal: Ability to maintain a balanced intake and output will improve Outcome: Progressing   Problem: Health Behavior/Discharge Planning: Goal: Ability to identify and utilize available resources and services will improve Outcome: Progressing Goal: Ability to manage health-related needs will improve Outcome: Progressing   Problem: Metabolic: Goal: Ability to maintain appropriate glucose levels will improve Outcome: Progressing   Problem: Nutritional: Goal: Maintenance of adequate nutrition will improve Outcome: Progressing Goal: Progress toward achieving an optimal weight will improve Outcome: Progressing   Problem: Skin Integrity: Goal: Risk for impaired skin integrity will decrease Outcome: Progressing    Problem: Tissue Perfusion: Goal: Adequacy of tissue perfusion will improve Outcome: Progressing   Problem: Safety: Goal: Non-violent Restraint(s) Outcome: Progressing

## 2022-05-23 DIAGNOSIS — E0590 Thyrotoxicosis, unspecified without thyrotoxic crisis or storm: Secondary | ICD-10-CM | POA: Diagnosis not present

## 2022-05-23 LAB — GLUCOSE, CAPILLARY
Glucose-Capillary: 91 mg/dL (ref 70–99)
Glucose-Capillary: 117 mg/dL — ABNORMAL HIGH (ref 70–99)

## 2022-05-23 NOTE — Discharge Summary (Signed)
Physician Discharge Summary  [name] OIN:[id number] DOB: 12-Oct-1946 DOA: 05/06/2022  PCP: [name], MD  Admit date: 05/06/2022 Discharge date: 05/23/2022  Admitted From: home Disposition:  SNF  Recommendations for Outpatient Follow-up:  Follow up with PCP in 1-2 weeks Recommend palliative care to follow-up at SNF Recommend TSH, free T3 and free T4 check in 2 weeks Recommend endocrinology referral and follow-up in the next 2 to 3 weeks  Home Health: none Equipment/Devices: none  Discharge Condition: stable CODE STATUS: DNR Diet Orders (From admission, onward)     Start     Ordered   05/13/22 1229  Diet Heart Room service appropriate? No; Fluid consistency: Thin  Diet effective now       Question Answer Comment  Room service appropriate? No   Fluid consistency: Thin      05/13/22 1228            HPI: Per admitting MD, [name] is a 76 y.o. male with medical history significant of PAF, HLD, DM2, chronic HFrEF, dementia. Presenting with altered mental status. History is per daughter at bedside. She reports that the patient has had increased confusion over the last several days. He's been hallucinating about objects that are not present. They have been working with their PCP about his progressing dementia for some time now. He was recently started on seroquel a couple days ago.  He seems to have worsened in that time. He has not complained of dyspnea, dysuria, fevers, N/V/D. When his symptoms didn't improve today, his family decided to bring him to the ED for assistance.   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  Ultrasound of the thyroid with low vascularity suggesting type II toxicity, and patient was started on steroids.  Before that he was placed on methimazole, metoprolol, continue. On admission TSH was undetectably low, free T3 was 5 and free T4 was 4.  Improved after the treatment with free T3 3.6 and free T4 3.1.  Placed on prednisone 20 mg daily, however he became more confused and this had to be stopped.  Please recheck TSH, free T3 and T4 in 2 to 3 weeks, refer to endocrinology, it is quite important that he is not on hyperthyroid treatment for prolonged period of time.   Active problems Acute Metabolic Encephalopathy -In the setting of hyperthyroidism, delirium.  Improving.  Tolerating Zyprexa well.  Continue PAF-Metoprolol held due to Bradycardia initially. Amiodarone discontinue due to hyperthyroidism. Cardiology consulted.  He was eventually started back on lower dose metoprolol and he is tolerating it well.  Continue anticoagulation  Acute on Chronic combined systolic and diastolic heart failure -New worsening cardiomyopathy, echo with ejection fraction down to 25 to 30%.  He was diuresed with Lasix, currently appears clinically euvolemic, on room air, continue home diuretics  Acute Hypoxic Respiratory Failure-resolved, currently on room air  Diabetes type 2, Hypoglycemia -continue home medications Dementia with behavioral disturbance-Seroquel has been on hold, he started to have hallucination after he was started  on Seroquel.  Much better on Zyprexa  Hyperlipidemia-Continue with Lipitor Hypertension-continue home medications  Elevated D-dimer -Doppler negative for DVT. Continue with Eliquis, he was already on Eliquis for PAF Hypokalemia-continue to monitor and replace as indicated.  Generalized weakness-PT ,  OT  AKI on CKD stage IIIa-Cr Peaked to 1.6.  This is likely due to urinary retention, he has a Foley.  Creatinine stabilized.  Started on Flomax, once more mobile go voiding trial Urine retention-started on Flomax, now urinating well Goals of care-palliative care consulted while hospitalized in the setting of worsening heart failure, significant hypothyroidism, underlying dementia.  Continue palliative discussions as an outpatient.  Sepsis ruled out   Discharge Instructions   Allergies as of 05/23/2022       Reactions   Amiodarone Other (See Comments)   Thyroid toxicity   Seroquel [quetiapine] Anxiety, Other (See Comments)   Hallucinations        Medication List     STOP taking these medications    amiodarone 200 MG tablet Commonly known as: PACERONE   QUEtiapine 50 MG tablet Commonly known as: SEROquel       TAKE these medications    acetaminophen 325 MG tablet Commonly known as: TYLENOL Take 650 mg by mouth 2 (two) times daily as needed (pain).   atorvastatin 20 MG tablet Commonly known as: LIPITOR TAKE 1 TABLET BY MOUTH EVERY DAY   blood glucose meter kit and supplies Dispense based on patient and insurance preference. Use up to four times daily as directed. (FOR ICD-10 E10.9, E11.9).   Eliquis 5 MG Tabs tablet Generic drug: apixaban TAKE 1 TABLET BY MOUTH TWICE A DAY What changed: how much to take   FLUoxetine 40 MG capsule Commonly known as: PROZAC TAKE 1 CAPSULE BY MOUTH EVERY DAY What changed: how much to take   furosemide 40 MG tablet Commonly known as: LASIX TAKE 1 TABLET BY MOUTH EVERY DAY   metFORMIN 500 MG tablet Commonly known as: GLUCOPHAGE TAKE 1 TABLET BY MOUTH TWICE DAILY WITH A MEAL   methimazole 10 MG tablet Commonly known as: TAPAZOLE Take 1 tablet (10 mg total) by mouth 3 (three) times daily.   metoprolol succinate 25 MG 24 hr tablet Commonly known as: TOPROL-XL Take 1 tablet (25 mg total) by mouth daily. What changed:   medication strength how much to take   Multivitamin Men 50+ Tabs Take 1 tablet by mouth daily.   OLANZapine 2.5 MG tablet Commonly known as: ZYPREXA Take 1 tablet (2.5 mg total) by mouth at bedtime.   spironolactone 25 MG tablet Commonly known as: ALDACTONE TAKE 1 TABLET BY MOUTH EVERY DAY   tamsulosin 0.4 MG Caps capsule Commonly known as: FLOMAX Take 1 capsule (0.4 mg total) by mouth daily.        Contact information for after-discharge care     Destination     [facility] .   Service: Skilled Nursing Contact information: [address] [place] [address] [phone]                     Consultations: Palliative care PCCM Cardiology   Procedures/Studies:  DG CHEST PORT 1 VIEW  Result Date: 05/19/2022 CLINICAL DATA:  Hypoxemia EXAM: PORTABLE CHEST 1 VIEW COMPARISON:  05/16/2022 FINDINGS: Cardiomegaly. Symmetric hazy appearance of the bilateral chest with cephalized blood flow. Suspect bilateral pleural effusion. No pneumothorax. IMPRESSION: CHF pattern which is progressed Electronically Signed   By: Roderic Palau  Watts M.D.   On: 05/19/2022 05:23   US THYROID  Result Date: 05/18/2022 CLINICAL DATA:  Hyperthyroid. EXAM: THYROID ULTRASOUND TECHNIQUE: Ultrasound examination of the thyroid gland and adjacent soft tissues was performed. COMPARISON:  None Available. FINDINGS: Parenchymal Echotexture: Mildly heterogeneous, no evidence of glandular hyperemia Isthmus: Unable to measure Right lobe: 3.7 x 2.2 x 1.9 cm Left lobe: 4.4 x 2.1 x 1.5 cm ________________________________________________________ Estimated total number of nodules >/= 1 cm: 0 Number of spongiform nodules >/=  2 cm not described below (TR1): 0 Number of mixed cystic and solid nodules >/= 1.5 cm not described below (TR2): 0 _________________________________________________________ No discrete nodules are seen within the thyroid gland. No  cervical lymphadenopathy. IMPRESSION: Mildly heterogeneous, normal-sized thyroid gland without evidence of glandular hyperemia. No worrisome thyroid nodules identified. The above is in keeping with the ACR TI-RADS recommendations - J Am Coll Radiol 2017;14:587-595. Ruthann Cancer, MD [facility] Electronically Signed   By: Ruthann Cancer M.D.   On: 05/18/2022 10:43   DG CHEST PORT 1 VIEW  Result Date: 05/16/2022 CLINICAL DATA:  Dyspnea EXAM: PORTABLE CHEST 1 VIEW COMPARISON:  Radiograph 05/11/2022 FINDINGS: Unchanged enlarged cardiac silhouette. There are diffuse interstitial opacities with lower lung predominant alveolar opacities. Mild improvement and airspace opacities since the prior exam. Stable small pleural effusions. No pneumothorax. Bones are unchanged. Thoracic spondylosis. IMPRESSION: Persistent but slightly improved pulmonary edema in comparison to the prior exam. Stable small pleural effusions. Electronically Signed   By: Maurine Simmering M.D.   On: 05/16/2022 09:35   EEG adult  Result Date: 05/12/2022 Lora Havens, MD     05/12/2022  3:55 PM Patient Name: [name] MRN: [id number] Epilepsy Attending: Lora Havens Referring Physician/Provider: Donita Brooks, NP Date: 05/12/2022 Duration: 26.50 mins Patient history: 76yo F with ams. EEG to evaluate for seizure. Level of alertness: Awake AEDs during EEG study: None Technical aspects: This EEG study was done with scalp electrodes positioned according to the 10-20 International system of electrode placement. Electrical activity was reviewed with band pass filter of 1-_0 , sensitivity of 7 uV/mm, display speed of 44m/sec with a _1  notched filter applied as appropriate. EEG data were recorded continuously and digitally stored.  Video monitoring was available and reviewed as appropriate. Description: The posterior dominant rhythm consists of 8 Hz activity of moderate voltage (25-35  uV) seen predominantly in posterior head regions, symmetric and reactive to eye opening and eye closing. EEG showed intermittent generalized 3 to 6 Hz theta-delta slowing. Hyperventilation and photic stimulation were not performed.   ABNORMALITY - Intermittent slow, generalized IMPRESSION: This study is suggestive of mild diffuse encephalopathy, nonspecific etiology. No seizures or epileptiform discharges were seen throughout the recording. PLora Havens   DG Abd Portable 1V  Result Date: 05/11/2022 CLINICAL DATA:  NG tube placement EXAM: PORTABLE ABDOMEN - 1 VIEW COMPARISON:  Chest 05/11/2022 FINDINGS: No enteric tube is demonstrated within the field of view. Visualized bowel gas pattern is normal with scattered gas in the colon. No small or large bowel distention. Surgical clips consistent with mesh hernia repair. IMPRESSION: No enteric tube demonstrated within the field of view. Electronically Signed   By: WLucienne CapersM.D.   On: 05/11/2022 17:56   DG CHEST PORT 1 VIEW  Result Date: 05/11/2022 CLINICAL DATA:  Encounter for hypoxemia EXAM: PORTABLE CHEST 1 VIEW COMPARISON:  Chest radiograph dated May 06, 2022 FINDINGS: The heart is enlarged. Bilateral lower lobe hazy opacities  concerning for pulmonary edema or infiltrate. Small to moderate bilateral pleural effusions. Thoracic spondylosis. No acute osseous abnormality. IMPRESSION: 1. Stable cardiomegaly. 2. Bilateral perihilar hazy opacities with pleural effusions suggesting pulmonary edema, suggesting worsening of CHF or development of airspace disease, clinical correlation is suggested. Electronically Signed   By: Keane Police D.O.   On: 05/11/2022 14:24   ECHOCARDIOGRAM COMPLETE  Result Date: 05/07/2022    ECHOCARDIOGRAM REPORT   Patient Name:   [name] Date of Exam: 05/07/2022 Medical Rec #:  [id number]         Height:       74.0 in Accession #:    [id number]        Weight:       268.1 lb Date of Birth:  02-04-1946         BSA:           2.462 m Patient Age:    82 years          BP:           119/66 mmHg Patient Gender: M                 HR:           82 bpm. Exam Location:  Inpatient Procedure: 2D Echo, Cardiac Doppler, Color Doppler and Intracardiac            Opacification Agent                               MODIFIED REPORT: This report was modified by Cherlynn Kaiser MD on 05/07/2022 due to revisions.  Indications:     Elevated Troponin  History:         Patient has prior history of Echocardiogram examinations, most                  recent 04/08/2019. Arrythmias:Atrial Fibrillation; Risk                  Factors:Hypertension, Dyslipidemia and Diabetes.  Sonographer:     Luane School RDCS Referring Phys:  [id number] Jonnie Finner Diagnosing Phys: Cherlynn Kaiser MD  Sonographer Comments: Suboptimal apical window. IMPRESSIONS  1. Left ventricular ejection fraction, by estimation, is 25 to 30%. The left ventricle has severely decreased function. The left ventricle demonstrates global hypokinesis. There is mild left ventricular hypertrophy. Left ventricular diastolic parameters  are consistent with Grade III diastolic dysfunction (restrictive).  2. Right ventricular systolic function is mildly reduced. The right ventricular size is mildly enlarged.  3. Left atrial size was severely dilated.  4. Right atrial size was mild to moderately dilated.  5. The mitral valve is normal in structure. Mild to moderate mitral valve regurgitation. No evidence of mitral stenosis.  6. The aortic valve is tricuspid. There is mild calcification of the aortic valve. There is mild thickening of the aortic valve. Aortic valve regurgitation is mild. No aortic stenosis is present.  7. Aortic dilatation noted. There is borderline dilatation of the aortic root, measuring 41 mm. FINDINGS  Left Ventricle: Left ventricular ejection fraction, by estimation, is 25 to 30%. The left ventricle has severely decreased function. The left ventricle demonstrates global hypokinesis. Definity  contrast agent was given IV to delineate the left ventricular endocardial borders. The left ventricular internal cavity size was normal in size. There is mild left ventricular hypertrophy. Left ventricular diastolic parameters are consistent with Grade III diastolic dysfunction (  restrictive). Right Ventricle: The right ventricular size is mildly enlarged. No increase in right ventricular wall thickness. Right ventricular systolic function is mildly reduced. Left Atrium: Left atrial size was severely dilated. Right Atrium: Right atrial size was mild to moderately dilated. Pericardium: Trivial pericardial effusion is present. Mitral Valve: Color scale was reduced for apical images of MR, which may overestimate severity. The mitral valve is normal in structure. Mild to moderate mitral valve regurgitation. No evidence of mitral valve stenosis. Tricuspid Valve: The tricuspid valve is normal in structure. Tricuspid valve regurgitation is mild . No evidence of tricuspid stenosis. Aortic Valve: The aortic valve is tricuspid. There is mild calcification of the aortic valve. There is mild thickening of the aortic valve. Aortic valve regurgitation is mild. Aortic regurgitation PHT measures 454 msec. No aortic stenosis is present. Pulmonic Valve: The pulmonic valve was normal in structure. Pulmonic valve regurgitation is mild. No evidence of pulmonic stenosis. Aorta: Aortic dilatation noted. There is borderline dilatation of the aortic root, measuring 41 mm. Venous: The inferior vena cava was not well visualized. IAS/Shunts: No atrial level shunt detected by color flow Doppler.  LEFT VENTRICLE PLAX 2D LVIDd:         6.90 cm      Diastology LVIDs:         6.20 cm      LV e' medial:    4.57 cm/s LV PW:         1.20 cm      LV E/e' medial:  21.7 LV IVS:        1.30 cm      LV e' lateral:   7.07 cm/s LVOT diam:     2.80 cm      LV E/e' lateral: 14.0 LV SV:         80 LV SV Index:   33 LVOT Area:     6.16 cm  LV Volumes (MOD) LV  vol d, MOD A2C: 192.0 ml LV vol d, MOD A4C: 241.0 ml LV vol s, MOD A2C: 140.0 ml LV vol s, MOD A4C: 192.0 ml LV SV MOD A2C:     52.0 ml LV SV MOD A4C:     241.0 ml LV SV MOD BP:      49.0 ml RIGHT VENTRICLE RV S prime:     9.57 cm/s TAPSE (M-mode): 2.1 cm LEFT ATRIUM              Index         RIGHT ATRIUM           Index LA diam:        5.50 cm  2.23 cm/m    RA Area:     25.40 cm LA Vol (A2C):   249.0 ml 101.15 ml/m  RA Volume:   92.00 ml  37.37 ml/m LA Vol (A4C):   201.0 ml 81.65 ml/m LA Biplane Vol: 236.0 ml 95.87 ml/m  AORTIC VALVE LVOT Vmax:   52.90 cm/s LVOT Vmean:  33.000 cm/s LVOT VTI:    0.130 m AI PHT:      454 msec  AORTA Ao Root diam: 4.10 cm Ao Asc diam:  3.85 cm MR Peak grad:    101.6 mmHg   TRICUSPID VALVE MR Mean grad:    62.0 mmHg    TR Peak grad:   28.1 mmHg MR Vmax:         504.00 cm/s  TR Vmax:        265.00 cm/s  MR Vmean:        366.0 cm/s MR PISA:         3.08 cm     SHUNTS MR PISA Eff ROA: 22 mm       Systemic VTI:  0.13 m MR PISA Radius:  0.70 cm      Systemic Diam: 2.80 cm MV E velocity: 99.00 cm/s Cherlynn Kaiser MD Electronically signed by Cherlynn Kaiser MD Signature Date/Time: 05/07/2022/4:11:05 PM    Final (Updated)    VAS [place] LOWER EXTREMITY VENOUS (DVT)  Result Date: 05/07/2022  Lower Venous DVT Study Patient Name:  [name]  Date of Exam:   05/07/2022 Medical Rec #: [id number]          Accession #:    [id number] Date of Birth: [birth year]          Patient Gender: M Patient Age:   38 years Exam Location:  [facility] Procedure:      VAS [place] LOWER EXTREMITY VENOUS (DVT) Referring Phys: Cherylann Ratel --------------------------------------------------------------------------------  Indications: Edema.  Comparison Study: No previous exam noted. Performing Technologist: Bobetta Lime BS, RVT  Examination Guidelines: A complete evaluation includes B-mode imaging, spectral Doppler, color Doppler, and power Doppler as needed of all accessible portions of each vessel.  Bilateral testing is considered an integral part of a complete examination. Limited examinations for reoccurring indications may be performed as noted. The reflux portion of the exam is performed with the patient in reverse Trendelenburg.  +---------+---------------+---------+-----------+----------+--------------+ RIGHT    CompressibilityPhasicitySpontaneityPropertiesThrombus Aging +---------+---------------+---------+-----------+----------+--------------+ CFV      Full           Yes      Yes                                 +---------+---------------+---------+-----------+----------+--------------+ SFJ      Full                                                        +---------+---------------+---------+-----------+----------+--------------+ FV Prox  Full                                                        +---------+---------------+---------+-----------+----------+--------------+ FV Mid   Full                                                        +---------+---------------+---------+-----------+----------+--------------+ FV DistalFull                                                        +---------+---------------+---------+-----------+----------+--------------+ PFV      Full                                                        +---------+---------------+---------+-----------+----------+--------------+  POP      Full           Yes      Yes                                 +---------+---------------+---------+-----------+----------+--------------+ PTV      Full                                                        +---------+---------------+---------+-----------+----------+--------------+ PERO     Full                                                        +---------+---------------+---------+-----------+----------+--------------+   +---------+---------------+---------+-----------+----------+--------------+ LEFT      CompressibilityPhasicitySpontaneityPropertiesThrombus Aging +---------+---------------+---------+-----------+----------+--------------+ CFV      Full           Yes      Yes                                 +---------+---------------+---------+-----------+----------+--------------+ SFJ      Full                                                        +---------+---------------+---------+-----------+----------+--------------+ FV Prox  Full                                                        +---------+---------------+---------+-----------+----------+--------------+ FV Mid   Full                                                        +---------+---------------+---------+-----------+----------+--------------+ FV DistalFull                                                        +---------+---------------+---------+-----------+----------+--------------+ PFV      Full                                                        +---------+---------------+---------+-----------+----------+--------------+ POP      Full           Yes      Yes                                 +---------+---------------+---------+-----------+----------+--------------+  PTV      Full                                                        +---------+---------------+---------+-----------+----------+--------------+ PERO     Full                                                        +---------+---------------+---------+-----------+----------+--------------+     Summary: BILATERAL: - No evidence of deep vein thrombosis seen in the lower extremities, bilaterally. -No evidence of popliteal cyst, bilaterally.   *See table(s) above for measurements and observations. Electronically signed by Orlie Pollen on 05/07/2022 at 11:57:39 AM.    Final    CT Head Wo Contrast  Result Date: 05/06/2022 CLINICAL DATA:  Mental status change EXAM: CT HEAD WITHOUT CONTRAST TECHNIQUE: Contiguous axial images  were obtained from the base of the skull through the vertex without intravenous contrast. RADIATION DOSE REDUCTION: This exam was performed according to the departmental dose-optimization program which includes automated exposure control, adjustment of the mA and/or kV according to patient size and/or use of iterative reconstruction technique. COMPARISON:  CT head 04/07/2019 FINDINGS: Motion artifact mildly obscures fine detail. Brain: No evidence of acute infarction, hemorrhage, hydrocephalus, extra-axial collection or mass lesion/mass effect. Moderate cerebral volume loss. Ill-defined hypoattenuation within the cerebral white matter is nonspecific but consistent with chronic small vessel ischemic disease. Vascular: No hyperdense vessel or unexpected calcification. Skull: Normal. Negative for fracture or focal lesion. Sinuses/Orbits: Nearly resolved left mastoid effusion. The paranasal sinuses are well aerated. Unremarkable orbits. Other: None. IMPRESSION: No acute intracranial abnormality.  Moderate cerebral atrophy. Electronically Signed   By: Placido Sou M.D.   On: 05/06/2022 09:58   DG Chest 2 View  Result Date: 05/06/2022 CLINICAL DATA:  Weakness and altered mental status. EXAM: CHEST - 2 VIEW COMPARISON:  Chest x-ray dated April 07, 2019. FINDINGS: Borderline cardiomegaly. Mild pulmonary vascular congestion. Small bilateral pleural effusions with left-greater-than-right lower lobe atelectasis. No pneumothorax. No acute osseous abnormality. IMPRESSION: 1. Mild pulmonary vascular congestion and small bilateral pleural effusions, suggestive of congestive heart failure. Electronically Signed   By: Titus Dubin M.D.   On: 05/06/2022 09:53     Subjective: - no chest pain, shortness of breath, no abdominal pain, nausea or vomiting.   Discharge Exam: BP 120/80 (BP Location: Left Arm)   Pulse 70   Temp 97.8 F (36.6 C)   Resp 19   Ht _0  (1.88 m)   Wt 111.4 kg   SpO2 94%   BMI 31.53 kg/m    General: Pt is alert, awake, not in acute distress Cardiovascular: RRR, S1/S2 +, no rubs, no gallops Respiratory: CTA bilaterally, no wheezing, no rhonchi Abdominal: Soft, NT, ND, bowel sounds + Extremities: no edema, no cyanosis, chronic venous stasis changes bilateral lower extremities    The results of significant diagnostics from this hospitalization (including imaging, microbiology, ancillary and laboratory) are listed below for reference.     Microbiology: No results found for this or any previous visit (from the past 240 hour(s)).    Labs: Basic Metabolic Panel: Recent Labs  Lab 05/17/22 0803 05/19/22 0506 05/20/22 0430  NA 143 142 141  K 3.8 3.3* 3.4*  CL 108 107 104  CO2 _0 GLUCOSE 114* 87 93  BUN _1 CREATININE 1.04 1.09 1.10  CALCIUM 8.9 8.5* 8.6*   Liver Function Tests: No results for input(s): "AST", "ALT", "ALKPHOS", "BILITOT", "PROT", "ALBUMIN" in the last 168 hours. CBC: Recent Labs  Lab 05/17/22 0803 05/19/22 0506  WBC 9.5 8.1  HGB 14.1 13.8  HCT 44.3 44.2  MCV 90.6 93.2  PLT 204 172   CBG: Recent Labs  Lab 05/22/22 1102 05/22/22 1638 05/22/22 1954 05/23/22 0730 05/23/22 1137  GLUCAP 106* 179* 98 91 117*   Hgb A1c No results for input(s): "HGBA1C" in the last 72 hours. Lipid Profile No results for input(s): "CHOL", "HDL", "LDLCALC", "TRIG", "CHOLHDL", "LDLDIRECT" in the last 72 hours. Thyroid function studies No results for input(s): "TSH", "T4TOTAL", "T3FREE", "THYROIDAB" in the last 72 hours.  Invalid input(s): "FREET3" Urinalysis    Component Value Date/Time   COLORURINE AMBER (A) 05/06/2022 1150   APPEARANCEUR CLEAR 05/06/2022 1150   LABSPEC 1.017 05/06/2022 1150   PHURINE 5.0 05/06/2022 1150   GLUCOSEU NEGATIVE 05/06/2022 1150   GLUCOSEU NEGATIVE 07/14/2021 1046   [place] 05/06/2022 1150   HGBUR large 10/23/2007 1455   BILIRUBINUR NEGATIVE 05/06/2022 1150   BILIRUBINUR N 03/02/2018 1655    KETONESUR NEGATIVE 05/06/2022 1150   PROTEINUR 100 (A) 05/06/2022 1150   UROBILINOGEN 0.2 07/14/2021 1046   NITRITE POSITIVE (A) 05/06/2022 1150   LEUKOCYTESUR TRACE (A) 05/06/2022 1150    FURTHER DISCHARGE INSTRUCTIONS:   Get Medicines reviewed and adjusted: Please take all your medications with you for your next visit with your Primary MD   Laboratory/radiological data: Please request your Primary MD to go over all hospital tests and procedure/radiological results at the follow up, please ask your Primary MD to get all Hospital records sent to his/her office.   In some cases, they will be blood work, cultures and biopsy results pending at the time of your discharge. Please request that your primary care M.D. goes through all the records of your hospital data and follows up on these results.   Also Note the following: If you experience worsening of your admission symptoms, develop shortness of breath, life threatening emergency, suicidal or homicidal thoughts you must seek medical attention immediately by calling 911 or calling your MD immediately  if symptoms less severe.   You must read complete instructions/literature along with all the possible adverse reactions/side effects for all the Medicines you take and that have been prescribed to you. Take any new Medicines after you have completely understood and accpet all the possible adverse reactions/side effects.    Do not drive when taking Pain medications or sleeping medications (Benzodaizepines)   Do not take more than prescribed Pain, Sleep and Anxiety Medications. It is not advisable to combine anxiety,sleep and pain medications without talking with your primary care practitioner   Special Instructions: If you have smoked or chewed Tobacco  in the last 2 yrs please stop smoking, stop any regular Alcohol  and or any Recreational drug use.   Wear Seat belts while driving.   Please note: You were cared for by a hospitalist during  your hospital stay. Once you are discharged, your primary care physician will handle any further medical issues. Please note that NO REFILLS for any discharge medications will be authorized once you are discharged, as it is imperative that you return to your primary care  physician (or establish a relationship with a primary care physician if you do not have one) for your post hospital discharge needs so that they can reassess your need for medications and monitor your lab values.  Time coordinating discharge: 40 minutes  SIGNED:  Marzetta Board, MD, PhD 05/23/2022, 12:01 PM

## 2022-05-23 NOTE — Care Management Important Message (Signed)
Important Message  Patient Details IM Letter given to the Patient. Name: [name] MRN: [id number] Date of Birth: Mar 01, 1946   Medicare Important Message Given:  Yes     Kerin Salen 05/23/2022, 10:42 AM

## 2022-05-23 NOTE — TOC Transition Note (Signed)
Transition of Care [facility]) - CM/SW Discharge Note   Patient Details  Name: [name] MRN: [id number] Date of Birth: Apr 28, 1946  [facility] CM/SW Contact:  Vassie Moselle, LCSW Phone Number: 05/23/2022, 12:31 PM   Clinical Narrative:    Pt is to transfer to [facility] for SNF placement. Pt will be going to room 39. RN to call report to [phone]. CSW notified pt's daughter, Junie Panning of discharge and transfer. PTAR will be arranged for transportation.    Final next level of care: Skilled Nursing Facility Barriers to Discharge: Barriers Resolved   Patient Goals and CMS Choice Patient states their goals for this hospitalization and ongoing recovery are:: To return home   Choice offered to / list presented to : Adult Children  Discharge Placement PASRR number recieved: 05/19/22            Patient [facility] bed at: [facility] Patient to be transferred to facility by: [facility] Name of family member notified: Dennard Schaumann Patient and family notified of of transfer: 05/23/22  Discharge Plan and Services In-house Referral: Clinical Social Work   Post Acute Care Choice: [place]          DME Arranged: N/A DME Agency: NA                  Social Determinants of Health (SDOH) Interventions     Readmission Risk Interventions    05/23/2022   12:29 PM 05/12/2022    2:43 PM  Readmission Risk Prevention Plan  Transportation Screening Complete Complete  PCP or Specialist Appt within 3-5 Days Complete   HRI or Home Care Consult Complete Complete  Social Work Consult for [place] Planning/Counseling Complete Complete  Palliative Care Screening Not Applicable Not Applicable  Medication Review Press photographer) Complete

## 2022-05-23 NOTE — Progress Notes (Signed)
Patient discharged to SNF via PTAR.

## 2022-05-23 NOTE — Progress Notes (Signed)
Report called to [place] at [facility]. Patient waiting on PTAR to arrive.

## 2022-05-24 ENCOUNTER — Ambulatory Visit: Payer: Medicare Other | Admitting: Physician Assistant

## 2022-06-07 ENCOUNTER — Encounter: Payer: Self-pay | Admitting: Family Medicine

## 2022-06-07 ENCOUNTER — Non-Acute Institutional Stay: Payer: Self-pay | Admitting: Family Medicine

## 2022-06-07 VITALS — BP 122/64 | HR 77 | Resp 18 | Wt 244.5 lb

## 2022-06-07 DIAGNOSIS — F03918 Unspecified dementia, unspecified severity, with other behavioral disturbance: Secondary | ICD-10-CM

## 2022-06-07 DIAGNOSIS — R443 Hallucinations, unspecified: Secondary | ICD-10-CM | POA: Insufficient documentation

## 2022-06-07 DIAGNOSIS — L853 Xerosis cutis: Secondary | ICD-10-CM | POA: Insufficient documentation

## 2022-06-07 NOTE — Progress Notes (Signed)
[facility] Palliative Care Consult Note Telephone: [phone]  Fax: [phone]   Date of encounter: 06/07/22 4:41 PM PATIENT NAME: [name] [id number]   [phone] (home)  DOB: 1946-03-29 MRN: [id number] PRIMARY CARE PROVIDER:    [name], MD,  [name] [id number] [phone]  REFERRING PROVIDER:   [name], MD [place],  [address] [address] [phone]  RESPONSIBLE PARTY:    Contact Information     Name Relation Home Work [name] Spouse [phone]  [phone]   [name], [name] Daughter   [phone]   [name], [name]   [phone]   [name], [name] Daughter   [phone]        I met face to face with patient and wife in his assisted living facility. Palliative Care was asked to follow this patient by consultation request of  [name] Morale, MD to address advance care planning and complex medical decision making. This is the initial visit.          ASSESSMENT, SYMPTOM MANAGEMENT AND PLAN / RECOMMENDATIONS:   Dementia with behavioral disturbance FAST 7 score 6e Not currently on specific medications Recommend continuation of Olanzapine and possible increase in frequency to BID.   Xerosis of skin Recommend [place] or equivalent to skin TID Recommend Hydroxyzine 10 mg Q 8 hours prn itching   Hallucinations Continue Olanzapine 2.5 mg, may want to give BID (one at HS and one midday or midafternoon before sundowning typically occurs.   Follow up Palliative Care Visit: Palliative care will continue to follow for complex medical decision making, advance care planning, and clarification of goals. Return 4-6 weeks or prn.    This visit was coded based on medical decision making (MDM).  PPS: 50%  HOSPICE ELIGIBILITY/DIAGNOSIS: TBD  Chief Complaint:  [name] received a  referral to follow up with patient for chronic disease management in setting of dementia.  Palliative Care is also following to assist with advance directive planning and defining/refining goals of care.   HISTORY OF PRESENT ILLNESS:  [name] is a 76 y.o. year old male with dementia, HTN, acute systolic CHF, atrial fibrillation, OSA, DM, hyperthyroidism, psoriasis, xerosis of scalp, nephrolithiasis, stage 3a CKD, non Hodgkins lymphoma, chronic anticoagulation.  Facility staff indicate that pt is restless and walking around at times but if he ambulates down hall without walker he becomes SOB.  He is incontinent of bladder per staff.  Pt states he has "tiny black bugs that bite and get  under the skin.  It makes my skin itch and when they come close to the surface I scratch and dig until I get them out."  Pt verbally repeats himself 2 or 3 times on certain subjects.  No recent falls.  Denies pain, nausea, vomiting, SOB.  Appetite per wife is variable.  Facility staff have noted some hallucinations and states he tends to be worse in the evenings.    History obtained from review of EMR, discussion with wife, facility staff and/or [name].   05/06/22 Urine cx + for 30,000 colonies Staph simulans, pansensitive CT head without contrast: IMPRESSION: No acute intracranial abnormality.  Moderate cerebral atrophy.  CXR 2 view: IMPRESSION: 1. Mild pulmonary vascular congestion and small bilateral pleural effusions, suggestive of congestive heart failure.  05/07/22 2d echo:      ECHOCARDIOGRAM REPORT      1. Left ventricular ejection fraction, by  estimation, is 25 to 30%. The  left ventricle has severely decreased function. The left ventricle  demonstrates global hypokinesis. There is mild left ventricular  hypertrophy. Left ventricular diastolic parameters   are consistent with Grade III diastolic dysfunction (restrictive).   2. Right ventricular systolic function is mildly reduced. The  right  ventricular size is mildly enlarged.   3. Left atrial size was severely dilated.   4. Right atrial size was mild to moderately dilated.   5. The mitral valve is normal in structure. Mild to moderate mitral valve  regurgitation. No evidence of mitral stenosis.   6. The aortic valve is tricuspid. There is mild calcification of the  aortic valve. There is mild thickening of the aortic valve. Aortic valve  regurgitation is mild. No aortic stenosis is present.   7. Aortic dilatation noted. There is borderline dilatation of the aortic  root, measuring 41 mm.   FINDINGS   Left Ventricle: EF 25 to 30%. LV severely decreased function with global hypokinesis.  Mild left ventricular hypertrophy. Grade III diastolic dysfunction (restrictive).   Right Ventricle: Mildly enlarged. RV systolic function is mildly reduced.   Left Atrium: Left atrial size was severely dilated.   Right Atrium: Right atrial size was mild to moderately dilated.    Mitral Valve: Mild to moderate mitral valve regurgitation without stenosis  Tricuspid Valve: Mild tricuspid regurgitation without stenosis.   Aortic Valve: Mild Aortic valve regurgitation.   Pulmonic Valve: Mild pulmonic valve regurgitation without stenosis.   Aorta: Aortic dilatation noted. There is borderline dilatation of the  aortic root, measuring 41 mm.       05/11/22 MRSA nasal PCR +--nasal colonization.  Blood cultures negative x 2  KUB: IMPRESSION: No enteric tube demonstrated within the field of view.  05/13/22 CMP remarkable for low Ca 8.8, Albumin 3.1, and eGFR 56 with elevated Na 148, glucose 109, CR 1.32, BUN 31 CBC remarkable for elevated WBC 11.1  05/14/22 ABG with pH 7.44, PCO2 normal at 44, PO2 low at 80, high bicarbonate 29.9  I reviewed available labs, medications, imaging, studies and related documents from the EMR.  Records reviewed and summarized above.   ROS General: NAD ENMT: denies dysphagia Cardiovascular:  denies chest pain, denies DOE  (facility staff endorses DOE) Pulmonary: denies cough, denies increased SOB Abdomen: endorses fair to good appetite, endorses episodic constipation, endorses continence of bowel GU: denies dysuria, staff endorses incontinence of urine MSK:  denies increased weakness, no falls reported, uses walker or WC for mobility Skin: thinks he sees bugs moving under the skin at times and when they get close to the surface and are itching "I dig them out". Neurological: denies pain, denies insomnia Psych: Endorses positive mood Heme/lymph/immuno: has some scattered bruising around areas of skin where he has been scratching  Physical Exam: Current and past weights: 244.5 lbs at facility Constitutional: NAD General: WN, WD ENMT: intact hearing, oral mucous membranes moist, dentition intact CV: S1S2, IRIR, no LE edema with cool, dusky color to feet and legs to mid calf with liposclerodermatous changes and peeling of skin Pulmonary: CTAB, no increased work of breathing, no cough, room air Abdomen: normo-active BS + 4 quadrants, soft and non tender MSK: no sarcopenia, moves all extremities, ambulatory Skin: multiple small abrasions to bilateral forearms with small amount of erythema surrounding lacerations that looked like the patient scratched, multiple varying size ecchymosis  Neuro:  no generalized weakness, no cognitive impairment Psych: non-anxious affect, A and O to person, place  and situation  Hem/lymph/immuno: scattered bruising of varying stages  CURRENT PROBLEM LIST:  Patient Active Problem List   Diagnosis Date Noted   Xerosis of skin 06/07/2022   Hallucinations, unspecified 06/07/2022   Hyperthyroidism 30/16/0109   Acute metabolic encephalopathy 32/35/5732   Dementia with behavioral disturbance ([place]) 05/06/2022   Stage 3a chronic kidney disease (CKD) ([place]) 05/06/2022   Elevated troponin 05/06/2022   Elevated d-dimer 05/06/2022   Positive colorectal cancer  screening using Cologuard test 11/02/2021   Chronic anticoagulation 11/02/2021   Cellulitis of right lower leg 10/07/2019   Chronic edema 05/17/2019   PAF (paroxysmal atrial fibrillation) ([place]) 04/10/2019   Chronic HFrEF (heart failure with reduced ejection fraction) ([place]) 04/08/2019   Altered mental status 04/07/2019   Psoriasis of scalp 08/15/2018   Anxiety, generalized 01/02/2017   DM2 (diabetes mellitus, type 2) ([place]) 11/25/2014   NICM (nonischemic cardiomyopathy) ([name]) 09/09/2013   HLD (hyperlipidemia) 09/09/2013   OSA (obstructive sleep apnea) 20/25/4270   Acute systolic CHF (congestive heart failure), NYHA class 2 ([place]) 08/27/2013   LEG EDEMA 09/14/2009   Lymphoma, non-Hodgkin's ([place]) 09/24/2008   VERTIGO 09/24/2008   ABDOMINAL MASS 10/26/2007   Essential hypertension 10/23/2007   NEPHROLITHIASIS 10/23/2007   HEADACHE 06/07/2007   PAST MEDICAL HISTORY:  Active Ambulatory Problems    Diagnosis Date Noted   Lymphoma, non-Hodgkin's ([place]) 09/24/2008   Essential hypertension 10/23/2007   NEPHROLITHIASIS 10/23/2007   VERTIGO 09/24/2008   LEG EDEMA 09/14/2009   HEADACHE 06/07/2007   ABDOMINAL MASS 62/37/6283   Acute systolic CHF (congestive heart failure), NYHA class 2 ([place]) 08/27/2013   OSA (obstructive sleep apnea) 08/31/2013   NICM (nonischemic cardiomyopathy) ([place]) 09/09/2013   HLD (hyperlipidemia) 09/09/2013   DM2 (diabetes mellitus, type 2) ([place]) 11/25/2014   Anxiety, generalized 01/02/2017   Psoriasis of scalp 08/15/2018   Altered mental status 04/07/2019   Chronic HFrEF (heart failure with reduced ejection fraction) ([place]) 04/08/2019   PAF (paroxysmal atrial fibrillation) ([place]) 04/10/2019   Chronic edema 05/17/2019   Cellulitis of right lower leg 10/07/2019   Positive colorectal cancer screening using Cologuard test 11/02/2021   Chronic anticoagulation 15/17/6160   Acute metabolic encephalopathy 73/71/0626   Dementia with behavioral disturbance ([place]) 05/06/2022    Stage 3a chronic kidney disease (CKD) ([place]) 05/06/2022   Elevated troponin 05/06/2022   Elevated d-dimer 05/06/2022   Hyperthyroidism 05/08/2022   Xerosis of skin 06/07/2022   Hallucinations, unspecified 06/07/2022   Resolved Ambulatory Problems    Diagnosis Date Noted   Sleep apnea 09/09/2013   Past Medical History:  Diagnosis Date   Chronic systolic CHF (congestive heart failure) ([place])    Diabetes mellitus without complication ([place])    Diverticulitis    Hyperlipidemia    Hypertension    Lymphoma, non Hodgkin's    Morbid obesity ([place])    SOCIAL HX:  Social History   Tobacco Use   Smoking status: Former    Packs/day: 2.00    Years: 52.00    Total pack years: 104.00    Types: Cigarettes    Start date: 08/27/1965    Quit date: 08/28/1999    Years since quitting: 22.7   Smokeless tobacco: Never   Tobacco comments:    quit 13 years ago  Substance Use Topics   Alcohol use: Yes    Alcohol/week: 0.0 standard drinks of alcohol    Comment: occ   FAMILY HX:  Family History  Problem Relation Age of Onset   Heart failure Mother    Hypertension  Mother    Cancer Father        colon   Hypertension Father    Diabetes Father    Stroke Maternal Grandmother    Diabetes Paternal Grandfather    Cancer Maternal Grandfather        Preferred Pharmacy: ALLERGIES:  Allergies  Allergen Reactions   Amiodarone Other (See Comments)    Thyroid toxicity   Seroquel [Quetiapine] Anxiety and Other (See Comments)    Hallucinations     PERTINENT MEDICATIONS:  Outpatient Encounter Medications as of 06/07/2022  Medication Sig   acetaminophen (TYLENOL) 325 MG tablet Take 650 mg by mouth 2 (two) times daily as needed (pain).   apixaban (ELIQUIS) 5 MG TABS tablet TAKE 1 TABLET BY MOUTH TWICE A DAY (Patient taking differently: Take 5 mg by mouth 2 (two) times daily.)   atorvastatin (LIPITOR) 20 MG tablet TAKE 1 TABLET BY MOUTH EVERY DAY (Patient taking differently: Take 20 mg by mouth  daily.)   blood glucose meter kit and supplies Dispense based on patient and insurance preference. Use up to four times daily as directed. (FOR ICD-10 E10.9, E11.9).   FLUoxetine (PROZAC) 40 MG capsule TAKE 1 CAPSULE BY MOUTH EVERY DAY (Patient taking differently: Take 40 mg by mouth daily.)   furosemide (LASIX) 40 MG tablet TAKE 1 TABLET BY MOUTH EVERY DAY (Patient taking differently: Take 40 mg by mouth daily.)   metFORMIN (GLUCOPHAGE) 500 MG tablet TAKE 1 TABLET BY MOUTH TWICE DAILY WITH A MEAL (Patient taking differently: Take 500 mg by mouth 2 (two) times daily with a meal.)   methimazole (TAPAZOLE) 10 MG tablet Take 1 tablet (10 mg total) by mouth 3 (three) times daily.   metoprolol succinate (TOPROL-XL) 25 MG 24 hr tablet Take 1 tablet (25 mg total) by mouth daily.   Multiple Vitamins-Minerals (MULTIVITAMIN MEN 50+) TABS Take 1 tablet by mouth daily.   OLANZapine (ZYPREXA) 2.5 MG tablet Take 1 tablet (2.5 mg total) by mouth at bedtime.   spironolactone (ALDACTONE) 25 MG tablet TAKE 1 TABLET BY MOUTH EVERY DAY (Patient taking differently: Take 25 mg by mouth daily.)   tamsulosin (FLOMAX) 0.4 MG CAPS capsule Take 1 capsule (0.4 mg total) by mouth daily.   No facility-administered encounter medications on file as of 06/07/2022.    Advance Care Planning/Goals of Care: Goals include to maximize quality of life and symptom management. CODE STATUS: Facility staff indicates DNR status    Thank you for the opportunity to participate in the care of [name].  The palliative care team will continue to follow. Please call our office at [phone] if we can be of additional assistance.   Marijo Conception, FNP-C  COVID-19 PATIENT SCREENING TOOL Asked and negative response unless otherwise noted:  Have you had symptoms of covid, tested positive or been in contact with someone with symptoms/positive test in the past 5-10 days?  No

## 2022-06-21 ENCOUNTER — Telehealth: Payer: Self-pay | Admitting: Family Medicine

## 2022-06-21 NOTE — Telephone Encounter (Signed)
Requesting verbal order for approval for plan of care for nursing. 1x3/1x every other week for 6 weeks

## 2022-06-22 NOTE — Telephone Encounter (Signed)
[facility] -  Still waiting for a response to the VO.  No voicemail available... Please ask for Tedd Sias or Lavella Lemons.  Vicente Males [phone] [place]  [phone]

## 2022-06-23 ENCOUNTER — Telehealth: Payer: Self-pay | Admitting: Family Medicine

## 2022-06-23 NOTE — Telephone Encounter (Signed)
Also requesting nursing order 1x3/1 every other week 6/ with 2 PRNs

## 2022-06-23 NOTE — Telephone Encounter (Signed)
[facility] -   [phone]  Verbal Orders  PT: Once a week for 1 wk 2 times a wk for 3 wks Once a wk for 3 wks  Concentrating on:    Gait, balance & strength

## 2022-06-24 ENCOUNTER — Encounter: Payer: Self-pay | Admitting: Family Medicine

## 2022-06-24 NOTE — Telephone Encounter (Signed)
Please okay these orders  ?

## 2022-06-24 NOTE — Telephone Encounter (Signed)
Verbal orders given to [facility] with [facility]

## 2022-06-24 NOTE — Telephone Encounter (Signed)
Spoke with [name] with [facility] home [facility], advised that verbal orders requested have been approved

## 2022-06-27 MED ORDER — OLANZAPINE 2.5 MG PO TABS: ORAL_TABLET | ORAL | 5 refills | Status: DC

## 2022-06-27 MED ORDER — TAMSULOSIN HCL 0.4 MG PO CAPS: 0.4000 mg | ORAL_CAPSULE | Freq: Every day | ORAL | 3 refills | Status: AC

## 2022-06-27 MED ORDER — LORAZEPAM 0.5 MG PO TABS: 0.5000 mg | ORAL_TABLET | Freq: Three times a day (TID) | ORAL | 5 refills | Status: AC | PRN

## 2022-06-27 MED ORDER — METHIMAZOLE 10 MG PO TABS: 10.0000 mg | ORAL_TABLET | Freq: Three times a day (TID) | ORAL | 5 refills | Status: AC

## 2022-06-27 NOTE — Telephone Encounter (Signed)
Done

## 2022-06-29 ENCOUNTER — Ambulatory Visit (HOSPITAL_BASED_OUTPATIENT_CLINIC_OR_DEPARTMENT_OTHER): Payer: Medicare Other | Admitting: Family

## 2022-07-02 ENCOUNTER — Other Ambulatory Visit: Payer: Self-pay | Admitting: Family Medicine

## 2022-07-05 ENCOUNTER — Telehealth: Payer: Self-pay | Admitting: Family Medicine

## 2022-07-05 NOTE — Telephone Encounter (Signed)
Please advise 

## 2022-07-05 NOTE — Telephone Encounter (Signed)
Pt's spouse called to inform MD that Pt tested positive for Covid.   Spouse would like to know if there is anything specific she should be doing for Pt or if there is a medication MD can prescribe for  him?  LOV:  05/04/22  Please advise.  [id number] [place] [place] [address]:  [phone] Fax:  [phone]

## 2022-07-06 ENCOUNTER — Encounter: Payer: Self-pay | Admitting: Family Medicine

## 2022-07-06 ENCOUNTER — Telehealth (INDEPENDENT_AMBULATORY_CARE_PROVIDER_SITE_OTHER): Payer: Medicare Other | Admitting: Family Medicine

## 2022-07-06 DIAGNOSIS — Z87891 Personal history of nicotine dependence: Secondary | ICD-10-CM

## 2022-07-06 DIAGNOSIS — F03918 Unspecified dementia, unspecified severity, with other behavioral disturbance: Secondary | ICD-10-CM | POA: Diagnosis not present

## 2022-07-06 DIAGNOSIS — U071 COVID-19: Secondary | ICD-10-CM

## 2022-07-06 MED ORDER — MOLNUPIRAVIR EUA 200MG CAPSULE: 4.0000 | ORAL_CAPSULE | Freq: Two times a day (BID) | ORAL | 0 refills | Status: DC

## 2022-07-06 NOTE — Progress Notes (Signed)
Virtual Visit via Video Note  I connected with [name] on 07/06/22 at  4:30 PM EDT by a video enabled telemedicine application 2/2 [id number] pandemic and verified that I am speaking with the correct person using two identifiers.  Location patient: home Location provider:work or home office Persons participating in the virtual visit: patient, provider, pt's daughter and wife.  I discussed the limitations of evaluation and management by telemedicine and the availability of in person appointments. The patient expressed understanding and agreed to proceed. Chief Complaint  Patient presents with   Covid Positive    Patient states the home Covid test was positive yesterday   Cough    Productive with gray-white sputum x2 weeks   Nasal Congestion    X2 days   Generalized Body Aches     HPI: Pt is a 76 yo male with pmh sig chronic HFrEF, DM, dementia, hyperthyroidism, A-fib, HTN, OSA, NICM, Non Hodgkin's lymphoma who was seen for acute concern. Pt tested positive for COVID yesterday. Symptoms started Monday with productive cough, rhinorrhea, achy, fatigue, and increased confusion. Patient also with decreased appetite.  Not drinking much. Denies diarrhea, fever, HAs, sore throat, SOB Pt's daughter and granddaughter had COVID.  Taking Tylenol. Had COVID vaccines and booster(s).  ROS: See pertinent positives and negatives per HPI.  Past Medical History:  Diagnosis Date   Chronic systolic CHF (congestive heart failure) ([place])    a. Echo (08/28/13): Mild LVH, EF 35-40%, diffuse HK, moderate to severe LAE.   Diabetes mellitus without complication (HCC)    Diverticulitis    Headache(784.0)    Hyperlipidemia    Hypertension    Lymphoma, non Hodgkin's    sees Dr. [name]    Morbid obesity [facility])    NICM (nonischemic cardiomyopathy) ([place])    a. R/L Heart cath 08/30/13: RA mean 8, RV 30/0, PA 27/12, mean PCWP 9, CO 5.67, CI 1.98; normal coronary arteries   Sleep apnea     Past  Surgical History:  Procedure Laterality Date   CARDIOVERSION N/A 06/28/2019   Procedure: CARDIOVERSION;  Surgeon: Lelon Perla, MD;  Location: [place];  Service: Cardiovascular;  Laterality: N/A;   COLONOSCOPY  11/26/2021   HERNIA REPAIR     umblical   incarcerated hernia     vental 11/19/08 Dr. [name]   LEFT AND RIGHT HEART CATHETERIZATION WITH CORONARY ANGIOGRAM N/A 08/30/2013   Procedure: LEFT AND RIGHT HEART CATHETERIZATION WITH CORONARY ANGIOGRAM;  Surgeon: [name], MD;  Location: [facility] CATH LAB;  Service: Cardiovascular;  Laterality: N/A;    Family History  Problem Relation Age of Onset   Heart failure Mother    Hypertension Mother    Cancer Father        colon   Hypertension Father    Diabetes Father    Stroke Maternal Grandmother    Diabetes Paternal Grandfather    Cancer Maternal Grandfather      Current Outpatient Medications:    acetaminophen (TYLENOL) 325 MG tablet, Take 650 mg by mouth 2 (two) times daily as needed (pain)., Disp: , Rfl:    apixaban (ELIQUIS) 5 MG TABS tablet, TAKE 1 TABLET BY MOUTH TWICE A DAY (Patient taking differently: Take 5 mg by mouth 2 (two) times daily.), Disp: 60 tablet, Rfl: 0   atorvastatin (LIPITOR) 20 MG tablet, TAKE 1 TABLET BY MOUTH EVERY DAY, Disp: 90 tablet, Rfl: 0   FLUoxetine (PROZAC) 40 MG capsule, TAKE 1 CAPSULE BY MOUTH EVERY DAY (Patient taking differently: Take  40 mg by mouth daily.), Disp: 90 capsule, Rfl: 3   furosemide (LASIX) 40 MG tablet, TAKE 1 TABLET BY MOUTH EVERY DAY, Disp: 90 tablet, Rfl: 0   LORazepam (ATIVAN) 0.5 MG tablet, Take 1 tablet (0.5 mg total) by mouth every 8 (eight) hours as needed for anxiety., Disp: 90 tablet, Rfl: 5   metFORMIN (GLUCOPHAGE) 500 MG tablet, TAKE 1 TABLET BY MOUTH TWICE DAILY WITH A MEAL (Patient taking differently: Take 500 mg by mouth 2 (two) times daily with a meal.), Disp: 180 tablet, Rfl: 0   methimazole (TAPAZOLE) 10 MG tablet, Take 1 tablet (10 mg total) by mouth 3  (three) times daily., Disp: 90 tablet, Rfl: 5   metoprolol succinate (TOPROL-XL) 25 MG 24 hr tablet, Take 1 tablet (25 mg total) by mouth daily., Disp: , Rfl:    Multiple Vitamins-Minerals (MULTIVITAMIN MEN 50+) TABS, Take 1 tablet by mouth daily., Disp: , Rfl:    OLANZapine (ZYPREXA) 2.5 MG tablet, Take one at suppertime and one at bedtime every day, Disp: 60 tablet, Rfl: 5   spironolactone (ALDACTONE) 25 MG tablet, TAKE 1 TABLET BY MOUTH EVERY DAY (Patient taking differently: Take 25 mg by mouth daily.), Disp: 90 tablet, Rfl: 0   tamsulosin (FLOMAX) 0.4 MG CAPS capsule, Take 1 capsule (0.4 mg total) by mouth daily., Disp: 90 capsule, Rfl: 3  EXAM:  VITALS per patient if applicable: RR between 32-12 bpm  GENERAL: alert, appears mildly fatigued and in no acute distress.  Some confusion noted  HEENT: atraumatic, conjunctiva clear, no obvious abnormalities on inspection of external nose and ears  NECK: normal movements of the head and neck  LUNGS: Intermittent productive cough, on inspection no signs of respiratory distress, breathing rate appears normal, no obvious gross SOB, gasping or wheezing  CV: no obvious cyanosis  MS: moves all visible extremities without noticeable abnormality  PSYCH/NEURO: pleasant and cooperative, no obvious depression or anxiety, speech and thought processing grossly intact  ASSESSMENT AND PLAN:  Discussed the following assessment and plan:  COVID-19 virus infection -Symptoms starting Monday 9/18 positive COVID-19 at home test 07/05/2022. -Discussed r/b/a of antiviral medication.  Patient/family wishes to start medication. -Continue expectant management of symptoms with OTC cough/cold medications, rest, hydration, etc. -Given strict precautions for worsening symptoms -Discussed duration of quarantine -Questions answered to satisfaction. -Given strict precautions - Plan: molnupiravir EUA (LAGEVRIO) 200 mg CAPS capsule  Dementia with behavioral  disturbance ([place]) -Increased confusion noted -Advised symptoms may be 2/2 current viral illness, but also consider UTI. -Increase p.o. intake of water and fluids -Continue current medications -Given strict precautions  Former smoker  Follow-up as needed for continued or worsening symptoms.   I discussed the assessment and treatment plan with the patient. The patient was provided an opportunity to ask questions and all were answered. The patient agreed with the plan and demonstrated an understanding of the instructions.   The patient was advised to call back or seek an in-person evaluation if the symptoms worsen or if the condition fails to improve as anticipated.   Billie Ruddy, MD

## 2022-07-06 NOTE — Telephone Encounter (Signed)
Set up a virtual OV with me today so I can treat this

## 2022-07-06 NOTE — Telephone Encounter (Signed)
Pt has virtual with dr banks  today 07-06-2022

## 2022-07-07 ENCOUNTER — Emergency Department (HOSPITAL_COMMUNITY): Payer: Medicare Other

## 2022-07-07 ENCOUNTER — Inpatient Hospital Stay (HOSPITAL_COMMUNITY)
Admission: EM | Admit: 2022-07-07 | Discharge: 2022-07-19 | DRG: 291 | Disposition: A | Discharge status: Home Health | Payer: Medicare Other | Attending: Internal Medicine | Admitting: Internal Medicine

## 2022-07-07 ENCOUNTER — Encounter (HOSPITAL_COMMUNITY): Payer: Self-pay

## 2022-07-07 ENCOUNTER — Other Ambulatory Visit: Payer: Self-pay

## 2022-07-07 DIAGNOSIS — J9601 Acute respiratory failure with hypoxia: Secondary | ICD-10-CM | POA: Diagnosis present

## 2022-07-07 DIAGNOSIS — N179 Acute kidney failure, unspecified: Secondary | ICD-10-CM | POA: Diagnosis present

## 2022-07-07 DIAGNOSIS — Z87891 Personal history of nicotine dependence: Secondary | ICD-10-CM | POA: Diagnosis not present

## 2022-07-07 DIAGNOSIS — F419 Anxiety disorder, unspecified: Secondary | ICD-10-CM | POA: Diagnosis present

## 2022-07-07 DIAGNOSIS — U071 COVID-19: Secondary | ICD-10-CM | POA: Diagnosis not present

## 2022-07-07 DIAGNOSIS — Z8572 Personal history of non-Hodgkin lymphomas: Secondary | ICD-10-CM

## 2022-07-07 DIAGNOSIS — Z833 Family history of diabetes mellitus: Secondary | ICD-10-CM

## 2022-07-07 DIAGNOSIS — I509 Heart failure, unspecified: Secondary | ICD-10-CM | POA: Diagnosis not present

## 2022-07-07 DIAGNOSIS — I5043 Acute on chronic combined systolic (congestive) and diastolic (congestive) heart failure: Secondary | ICD-10-CM | POA: Diagnosis present

## 2022-07-07 DIAGNOSIS — F03918 Unspecified dementia, unspecified severity, with other behavioral disturbance: Secondary | ICD-10-CM | POA: Diagnosis present

## 2022-07-07 DIAGNOSIS — F0393 Unspecified dementia, unspecified severity, with mood disturbance: Secondary | ICD-10-CM | POA: Diagnosis not present

## 2022-07-07 DIAGNOSIS — Z888 Allergy status to other drugs, medicaments and biological substances status: Secondary | ICD-10-CM

## 2022-07-07 DIAGNOSIS — F0394 Unspecified dementia, unspecified severity, with anxiety: Secondary | ICD-10-CM | POA: Diagnosis not present

## 2022-07-07 DIAGNOSIS — Z66 Do not resuscitate: Secondary | ICD-10-CM | POA: Diagnosis present

## 2022-07-07 DIAGNOSIS — R0602 Shortness of breath: Secondary | ICD-10-CM | POA: Diagnosis not present

## 2022-07-07 DIAGNOSIS — Z8249 Family history of ischemic heart disease and other diseases of the circulatory system: Secondary | ICD-10-CM | POA: Diagnosis not present

## 2022-07-07 DIAGNOSIS — L89152 Pressure ulcer of sacral region, stage 2: Secondary | ICD-10-CM | POA: Diagnosis present

## 2022-07-07 DIAGNOSIS — E119 Type 2 diabetes mellitus without complications: Secondary | ICD-10-CM | POA: Diagnosis present

## 2022-07-07 DIAGNOSIS — N400 Enlarged prostate without lower urinary tract symptoms: Secondary | ICD-10-CM | POA: Diagnosis present

## 2022-07-07 DIAGNOSIS — I10 Essential (primary) hypertension: Secondary | ICD-10-CM | POA: Diagnosis not present

## 2022-07-07 DIAGNOSIS — I110 Hypertensive heart disease with heart failure: Secondary | ICD-10-CM | POA: Diagnosis not present

## 2022-07-07 DIAGNOSIS — I428 Other cardiomyopathies: Secondary | ICD-10-CM | POA: Diagnosis not present

## 2022-07-07 DIAGNOSIS — L8990 Pressure ulcer of unspecified site, unspecified stage: Secondary | ICD-10-CM | POA: Insufficient documentation

## 2022-07-07 DIAGNOSIS — F32A Depression, unspecified: Secondary | ICD-10-CM | POA: Diagnosis present

## 2022-07-07 DIAGNOSIS — R609 Edema, unspecified: Secondary | ICD-10-CM | POA: Diagnosis present

## 2022-07-07 DIAGNOSIS — F03A11 Unspecified dementia, mild, with agitation: Secondary | ICD-10-CM | POA: Diagnosis present

## 2022-07-07 DIAGNOSIS — F03A18 Unspecified dementia, mild, with other behavioral disturbance: Secondary | ICD-10-CM | POA: Diagnosis present

## 2022-07-07 DIAGNOSIS — Z79899 Other long term (current) drug therapy: Secondary | ICD-10-CM | POA: Diagnosis not present

## 2022-07-07 DIAGNOSIS — G4733 Obstructive sleep apnea (adult) (pediatric): Secondary | ICD-10-CM | POA: Diagnosis present

## 2022-07-07 DIAGNOSIS — E785 Hyperlipidemia, unspecified: Secondary | ICD-10-CM | POA: Diagnosis present

## 2022-07-07 DIAGNOSIS — Z7984 Long term (current) use of oral hypoglycemic drugs: Secondary | ICD-10-CM

## 2022-07-07 DIAGNOSIS — Z6832 Body mass index (BMI) 32.0-32.9, adult: Secondary | ICD-10-CM

## 2022-07-07 DIAGNOSIS — E876 Hypokalemia: Secondary | ICD-10-CM | POA: Diagnosis present

## 2022-07-07 DIAGNOSIS — F0390 Unspecified dementia without behavioral disturbance: Secondary | ICD-10-CM | POA: Diagnosis present

## 2022-07-07 DIAGNOSIS — Z7901 Long term (current) use of anticoagulants: Secondary | ICD-10-CM | POA: Diagnosis not present

## 2022-07-07 DIAGNOSIS — I480 Paroxysmal atrial fibrillation: Secondary | ICD-10-CM | POA: Diagnosis present

## 2022-07-07 DIAGNOSIS — J1282 Pneumonia due to coronavirus disease 2019: Secondary | ICD-10-CM | POA: Diagnosis not present

## 2022-07-07 DIAGNOSIS — E0590 Thyrotoxicosis, unspecified without thyrotoxic crisis or storm: Secondary | ICD-10-CM | POA: Diagnosis present

## 2022-07-07 DIAGNOSIS — G9341 Metabolic encephalopathy: Secondary | ICD-10-CM | POA: Diagnosis present

## 2022-07-07 LAB — GLUCOSE, CAPILLARY: Glucose-Capillary: 122 mg/dL — ABNORMAL HIGH (ref 70–99)

## 2022-07-07 LAB — CBC WITH DIFFERENTIAL/PLATELET
Abs Immature Granulocytes: 0.02 10*3/uL (ref 0.00–0.07)
Basophils Absolute: 0 10*3/uL (ref 0.0–0.1)
Basophils Relative: 0 %
Eosinophils Absolute: 0 10*3/uL (ref 0.0–0.5)
Eosinophils Relative: 0 %
HCT: 39.3 % (ref 39.0–52.0)
Hemoglobin: 12.3 g/dL — ABNORMAL LOW (ref 13.0–17.0)
Immature Granulocytes: 0 %
Lymphocytes Relative: 7 %
Lymphs Abs: 0.3 10*3/uL — ABNORMAL LOW (ref 0.7–4.0)
MCH: 28 pg (ref 26.0–34.0)
MCHC: 31.3 g/dL (ref 30.0–36.0)
MCV: 89.5 fL (ref 80.0–100.0)
Monocytes Absolute: 0.2 10*3/uL (ref 0.1–1.0)
Monocytes Relative: 5 %
Neutro Abs: 4.2 10*3/uL (ref 1.7–7.7)
Neutrophils Relative %: 88 %
Platelets: 204 10*3/uL (ref 150–400)
RBC: 4.39 MIL/uL (ref 4.22–5.81)
RDW: 16.1 % — ABNORMAL HIGH (ref 11.5–15.5)
WBC: 4.8 10*3/uL (ref 4.0–10.5)
nRBC: 0 % (ref 0.0–0.2)

## 2022-07-07 LAB — TSH: TSH: 0.157 u[IU]/mL — ABNORMAL LOW (ref 0.350–4.500)

## 2022-07-07 LAB — SARS CORONAVIRUS 2 BY RT PCR: SARS Coronavirus 2 by RT PCR: POSITIVE — AB

## 2022-07-07 LAB — COMPREHENSIVE METABOLIC PANEL
ALT: 14 U/L (ref 0–44)
AST: 22 U/L (ref 15–41)
Albumin: 3 g/dL — ABNORMAL LOW (ref 3.5–5.0)
Alkaline Phosphatase: 64 U/L (ref 38–126)
Anion gap: 8 (ref 5–15)
BUN: 23 mg/dL (ref 8–23)
CO2: 22 mmol/L (ref 22–32)
Calcium: 8.3 mg/dL — ABNORMAL LOW (ref 8.9–10.3)
Chloride: 111 mmol/L (ref 98–111)
Creatinine, Ser: 1.14 mg/dL (ref 0.61–1.24)
GFR, Estimated: >60 mL/min (ref >60)
Glucose, Bld: 143 mg/dL — ABNORMAL HIGH (ref 70–99)
Potassium: 5 mmol/L (ref 3.5–5.1)
Sodium: 141 mmol/L (ref 135–145)
Total Bilirubin: 0.9 mg/dL (ref 0.3–1.2)
Total Protein: 6.4 g/dL — ABNORMAL LOW (ref 6.5–8.1)

## 2022-07-07 LAB — PROTIME-INR
INR: 1.3 — ABNORMAL HIGH (ref 0.8–1.2)
Prothrombin Time: 16.1 seconds — ABNORMAL HIGH (ref 11.4–15.2)

## 2022-07-07 LAB — LACTIC ACID, PLASMA: Lactic Acid, Venous: 1.5 mmol/L (ref 0.5–1.9)

## 2022-07-07 LAB — HEMOGLOBIN A1C
Hgb A1c MFr Bld: 5.7 % — ABNORMAL HIGH (ref 4.8–5.6)
Mean Plasma Glucose: 116.89 mg/dL

## 2022-07-07 LAB — BRAIN NATRIURETIC PEPTIDE: B Natriuretic Peptide: 4248.8 pg/mL — ABNORMAL HIGH (ref 0.0–100.0)

## 2022-07-07 LAB — T4, FREE: Free T4: 1.87 ng/dL — ABNORMAL HIGH (ref 0.61–1.12)

## 2022-07-07 MED ORDER — ACETAMINOPHEN 325 MG PO TABS: 650.0000 mg | ORAL_TABLET | Freq: Four times a day (QID) | ORAL | Status: DC | PRN

## 2022-07-07 MED ORDER — APIXABAN 5 MG PO TABS
5.0000 mg | ORAL_TABLET | Freq: Two times a day (BID) | ORAL | Status: DC
  Administered 2022-07-07 – 2022-07-19 (×24): 5 mg via ORAL
  Filled 2022-07-07 (×24): qty 1

## 2022-07-07 MED ORDER — ONDANSETRON HCL 4 MG PO TABS: 4.0000 mg | ORAL_TABLET | Freq: Four times a day (QID) | ORAL | Status: DC | PRN

## 2022-07-07 MED ORDER — ACETAMINOPHEN 650 MG RE SUPP: 650.0000 mg | Freq: Four times a day (QID) | RECTAL | Status: DC | PRN

## 2022-07-07 MED ORDER — GUAIFENESIN ER 600 MG PO TB12
600.0000 mg | ORAL_TABLET | Freq: Two times a day (BID) | ORAL | Status: DC
  Administered 2022-07-07 – 2022-07-19 (×24): 600 mg via ORAL
  Filled 2022-07-07 (×24): qty 1

## 2022-07-07 MED ORDER — VITAMIN C 500 MG PO TABS
500.0000 mg | ORAL_TABLET | Freq: Every day | ORAL | Status: DC
  Administered 2022-07-07 – 2022-07-19 (×13): 500 mg via ORAL
  Filled 2022-07-07 (×13): qty 1

## 2022-07-07 MED ORDER — MELATONIN 3 MG PO TABS
3.0000 mg | ORAL_TABLET | Freq: Every day | ORAL | Status: DC
  Administered 2022-07-07 – 2022-07-10 (×4): 3 mg via ORAL
  Filled 2022-07-07 (×4): qty 1

## 2022-07-07 MED ORDER — ATORVASTATIN CALCIUM 10 MG PO TABS
20.0000 mg | ORAL_TABLET | Freq: Every day | ORAL | Status: DC
  Administered 2022-07-07 – 2022-07-19 (×13): 20 mg via ORAL
  Filled 2022-07-07 (×13): qty 2

## 2022-07-07 MED ORDER — ALBUTEROL SULFATE (2.5 MG/3ML) 0.083% IN NEBU
2.5000 mg | INHALATION_SOLUTION | Freq: Four times a day (QID) | RESPIRATORY_TRACT | Status: DC
  Administered 2022-07-07 – 2022-07-08 (×2): 2.5 mg via RESPIRATORY_TRACT
  Filled 2022-07-07 (×2): qty 3

## 2022-07-07 MED ORDER — TAMSULOSIN HCL 0.4 MG PO CAPS
0.4000 mg | ORAL_CAPSULE | Freq: Every day | ORAL | Status: DC
  Administered 2022-07-07 – 2022-07-19 (×13): 0.4 mg via ORAL
  Filled 2022-07-07 (×13): qty 1

## 2022-07-07 MED ORDER — FUROSEMIDE 10 MG/ML IJ SOLN: 40.0000 mg | Freq: Two times a day (BID) | INTRAMUSCULAR | Status: DC

## 2022-07-07 MED ORDER — INSULIN ASPART 100 UNIT/ML IJ SOLN
0.0000 [IU] | Freq: Every day | INTRAMUSCULAR | Status: DC
  Administered 2022-07-12: 0 [IU] via SUBCUTANEOUS
  Filled 2022-07-07: qty 0.05

## 2022-07-07 MED ORDER — LORAZEPAM 0.5 MG PO TABS
0.5000 mg | ORAL_TABLET | Freq: Three times a day (TID) | ORAL | Status: DC | PRN
  Administered 2022-07-08 – 2022-07-11 (×4): 0.5 mg via ORAL
  Filled 2022-07-07 (×4): qty 1

## 2022-07-07 MED ORDER — MOLNUPIRAVIR EUA 200MG CAPSULE
4.0000 | ORAL_CAPSULE | Freq: Two times a day (BID) | ORAL | Status: AC
  Administered 2022-07-07 – 2022-07-12 (×10): 800 mg via ORAL
  Filled 2022-07-07: qty 4

## 2022-07-07 MED ORDER — INSULIN ASPART 100 UNIT/ML IJ SOLN
0.0000 [IU] | Freq: Three times a day (TID) | INTRAMUSCULAR | Status: DC
  Administered 2022-07-08 – 2022-07-11 (×3): 1 [IU] via SUBCUTANEOUS
  Administered 2022-07-12: 2 [IU] via SUBCUTANEOUS
  Administered 2022-07-13: 1 [IU] via SUBCUTANEOUS
  Administered 2022-07-13: 2 [IU] via SUBCUTANEOUS
  Administered 2022-07-15 – 2022-07-16 (×2): 1 [IU] via SUBCUTANEOUS
  Administered 2022-07-17: 3 [IU] via SUBCUTANEOUS
  Administered 2022-07-18: 1 [IU] via SUBCUTANEOUS
  Filled 2022-07-07: qty 0.09

## 2022-07-07 MED ORDER — HYDRALAZINE HCL 20 MG/ML IJ SOLN: 10.0000 mg | Freq: Four times a day (QID) | INTRAMUSCULAR | Status: DC | PRN

## 2022-07-07 MED ORDER — OLANZAPINE 5 MG PO TABS
2.5000 mg | ORAL_TABLET | Freq: Two times a day (BID) | ORAL | Status: DC
  Administered 2022-07-07 – 2022-07-09 (×4): 2.5 mg via ORAL
  Filled 2022-07-07 (×5): qty 1

## 2022-07-07 MED ORDER — FLUOXETINE HCL 20 MG PO CAPS
40.0000 mg | ORAL_CAPSULE | Freq: Every day | ORAL | Status: DC
  Administered 2022-07-07 – 2022-07-19 (×13): 40 mg via ORAL
  Filled 2022-07-07 (×13): qty 2

## 2022-07-07 MED ORDER — POLYETHYLENE GLYCOL 3350 17 G PO PACK: 17.0000 g | PACK | Freq: Every day | ORAL | Status: DC | PRN

## 2022-07-07 MED ORDER — ZINC SULFATE 220 (50 ZN) MG PO CAPS
220.0000 mg | ORAL_CAPSULE | Freq: Every day | ORAL | Status: DC
  Administered 2022-07-07 – 2022-07-19 (×13): 220 mg via ORAL
  Filled 2022-07-07 (×13): qty 1

## 2022-07-07 MED ORDER — METHYLPREDNISOLONE SODIUM SUCC 40 MG IJ SOLR
40.0000 mg | Freq: Every day | INTRAMUSCULAR | Status: DC
  Administered 2022-07-07 – 2022-07-09 (×3): 40 mg via INTRAVENOUS
  Filled 2022-07-07 (×4): qty 1

## 2022-07-07 MED ORDER — SENNA 8.6 MG PO TABS
1.0000 | ORAL_TABLET | Freq: Two times a day (BID) | ORAL | Status: DC
  Administered 2022-07-07 – 2022-07-19 (×24): 8.6 mg via ORAL
  Filled 2022-07-07 (×24): qty 1

## 2022-07-07 MED ORDER — METOPROLOL SUCCINATE ER 25 MG PO TB24
25.0000 mg | ORAL_TABLET | Freq: Every day | ORAL | Status: DC
  Administered 2022-07-07 – 2022-07-19 (×13): 25 mg via ORAL
  Filled 2022-07-07 (×13): qty 1

## 2022-07-07 MED ORDER — ONDANSETRON HCL 4 MG/2ML IJ SOLN: 4.0000 mg | Freq: Four times a day (QID) | INTRAMUSCULAR | Status: DC | PRN

## 2022-07-07 MED ORDER — PANTOPRAZOLE SODIUM 40 MG IV SOLR
40.0000 mg | INTRAVENOUS | Status: DC
  Administered 2022-07-07: 40 mg via INTRAVENOUS
  Filled 2022-07-07: qty 10

## 2022-07-07 MED ORDER — FUROSEMIDE 10 MG/ML IJ SOLN
40.0000 mg | Freq: Once | INTRAMUSCULAR | Status: AC
  Administered 2022-07-07: 40 mg via INTRAVENOUS
  Filled 2022-07-07: qty 4

## 2022-07-07 NOTE — ED Provider Notes (Signed)
[facility] DEPT Provider Note   CSN: [id number] Arrival date & time: 07/07/22  1251     History  Chief Complaint  Patient presents with   COVID+   Weakness    [name] is a 76 y.o. male.  76 year old male with history of CHF, A.Fib on Eliquis, diabetes melitis type II, dementia brought to emergency department via ambulance for evaluation of cough, shortness of breath and weakness.  History provided by the wife.  Wife states patient got sick Monday with gradually worsening fatigue, weakness, cough, on and off fever and shortness of breath.  He took a home COVID test on Tuesday and tested positive.  Wife called primary care physician on Wednesday and patient was prescribed molnupiravir which she took just 1 dose of it.  Wife also reports patient is more confused and lethargic.  His urine has an odor however patient denies dysuria.  Patient was incontinent yesterday as per wife.  He denies chest pain, palpitations, abdominal pain, nausea, vomiting, diarrhea.  He denies loss of taste or smell.  Patient reports he was exposed to [place].  Patient is hypoxemic, tachypneic.  His SPO2 is between 92-93 on room air.  His saturation improved with 2 L oxygen via nasal cannula to 95 to 96%.  His blood pressure is stable and he is afebrile on presenting to ED.  Patient were sick about a month ago and he was hospitalized for 2 weeks.  Patient was discharged and had 2-week of rehab which she completed about 2 weeks ago.   The history is provided by the spouse. No language interpreter was used.  Weakness Severity:  Moderate Onset quality:  Gradual Duration:  3 days Progression:  Worsening Chronicity:  New Associated symptoms: cough and shortness of breath   Associated symptoms: no abdominal pain        Home Medications Prior to Admission medications   Medication Sig Start Date End Date Taking? Authorizing Provider  acetaminophen (TYLENOL) 325 MG tablet  Take 650 mg by mouth 2 (two) times daily as needed (pain).    [provider]  apixaban (ELIQUIS) 5 MG TABS tablet TAKE 1 TABLET BY MOUTH TWICE A DAY Patient taking differently: Take 5 mg by mouth 2 (two) times daily. 08/23/21   Buford Dresser, MD  atorvastatin (LIPITOR) 20 MG tablet TAKE 1 TABLET BY MOUTH EVERY DAY 07/04/22   [name], MD  FLUoxetine (PROZAC) 40 MG capsule TAKE 1 CAPSULE BY MOUTH EVERY DAY Patient taking differently: Take 40 mg by mouth daily. 01/03/22   [name], MD  furosemide (LASIX) 40 MG tablet TAKE 1 TABLET BY MOUTH EVERY DAY 07/04/22   [name], MD  LORazepam (ATIVAN) 0.5 MG tablet Take 1 tablet (0.5 mg total) by mouth every 8 (eight) hours as needed for anxiety. 06/27/22   [name], MD  metFORMIN (GLUCOPHAGE) 500 MG tablet TAKE 1 TABLET BY MOUTH TWICE DAILY WITH A MEAL Patient taking differently: Take 500 mg by mouth 2 (two) times daily with a meal. 09/21/21   [name], MD  methimazole (TAPAZOLE) 10 MG tablet Take 1 tablet (10 mg total) by mouth 3 (three) times daily. 06/27/22   [name], MD  metoprolol succinate (TOPROL-XL) 25 MG 24 hr tablet Take 1 tablet (25 mg total) by mouth daily. 05/20/22   Caren Griffins, MD  molnupiravir EUA (LAGEVRIO) 200 mg CAPS capsule Take 4 capsules (800 mg total) by mouth 2 (two) times  daily for 5 days. 07/06/22 07/11/22  Billie Ruddy, MD  Multiple Vitamins-Minerals (MULTIVITAMIN MEN 50+) TABS Take 1 tablet by mouth daily.    [provider]  OLANZapine (ZYPREXA) 2.5 MG tablet Take one at suppertime and one at bedtime every day 06/27/22   [name], MD  spironolactone (ALDACTONE) 25 MG tablet TAKE 1 TABLET BY MOUTH EVERY DAY Patient taking differently: Take 25 mg by mouth daily. 12/28/21   [name], MD  tamsulosin (FLOMAX) 0.4 MG CAPS capsule Take 1 capsule (0.4 mg total) by mouth daily. 06/27/22   [name], MD      Allergies    Amiodarone and Seroquel [quetiapine]     Review of Systems   Review of Systems  Constitutional:  Positive for fatigue.  Respiratory:  Positive for cough and shortness of breath.   Gastrointestinal:  Negative for abdominal pain.  Neurological:  Positive for weakness.    Physical Exam Updated Vital Signs BP 134/86   Pulse 77   Temp 97.9 F (36.6 C) (Oral)   Resp (!) 25   SpO2 95%  Physical Exam Vitals and nursing note reviewed.  Constitutional:      Appearance: He is ill-appearing.  HENT:     Head: Normocephalic and atraumatic.  Eyes:     Conjunctiva/sclera: Conjunctivae normal.     Pupils: Pupils are equal, round, and reactive to light.  Cardiovascular:     Rate and Rhythm: Normal rate and regular rhythm.     Heart sounds: Normal heart sounds. No murmur heard. Pulmonary:     Breath sounds: Rhonchi present. No wheezing.     Comments: Increase effort of breathing without Spo2 Chest:     Chest wall: No tenderness.  Abdominal:     Tenderness: There is no abdominal tenderness. There is no guarding or rebound.  Musculoskeletal:     Cervical back: Neck supple.     Right lower leg: 2+ Pitting Edema present.     Left lower leg: 2+ Pitting Edema present.     Comments: 2+ lower leg pitting edema bilaterally.  Skin:    Findings: No rash.     Comments: cool  Neurological:     General: No focal deficit present.     Mental Status: He is alert.  Psychiatric:        Mood and Affect: Mood normal.     ED Results / Procedures / Treatments   Labs (all labs ordered are listed, but only abnormal results are displayed) Labs Reviewed  SARS CORONAVIRUS 2 BY RT PCR - Abnormal; Notable for the following components:      Result Value   SARS Coronavirus 2 by RT PCR POSITIVE (*)    All other components within normal limits  COMPREHENSIVE METABOLIC PANEL - Abnormal; Notable for the following components:   Glucose, Bld 143 (*)    Calcium 8.3 (*)    Total Protein 6.4 (*)    Albumin 3.0 (*)    All other components within  normal limits  CBC WITH DIFFERENTIAL/PLATELET - Abnormal; Notable for the following components:   Hemoglobin 12.3 (*)    RDW 16.1 (*)    Lymphs Abs 0.3 (*)    All other components within normal limits  PROTIME-INR - Abnormal; Notable for the following components:   Prothrombin Time 16.1 (*)    INR 1.3 (*)    All other components within normal limits  BRAIN NATRIURETIC PEPTIDE - Abnormal; Notable for the following components:  B Natriuretic Peptide 4,248.8 (*)    All other components within normal limits  CULTURE, BLOOD (ROUTINE X 2)  CULTURE, BLOOD (ROUTINE X 2)  LACTIC ACID, PLASMA  LACTIC ACID, PLASMA  URINALYSIS, ROUTINE W REFLEX MICROSCOPIC    EKG EKG Interpretation  Date/Time:  Thursday July 07 2022 13:14:35 EDT Ventricular Rate:  85 PR Interval:  255 QRS Duration: 128 QT Interval:  436 QTC Calculation: 519 R Axis:   -63 Text Interpretation: Sinus or ectopic atrial rhythm Prolonged PR interval Nonspecific IVCD with LAD Abnormal ECG Confirmed by [name] Muskrat [phone]) on 07/07/2022 2:35:06 PM  Radiology DG Chest Port 1 View  Result Date: 07/07/2022 CLINICAL DATA:  Shortness of breath EXAM: PORTABLE CHEST 1 VIEW COMPARISON:  05/19/2022 FINDINGS: Cardiomegaly. Aortic atherosclerosis. Pulmonary vascular congestion. Diffuse interstitial and alveolar opacities, most pronounced within the right lower lobe. Probable small bilateral pleural effusions. No pneumothorax. IMPRESSION: Cardiomegaly with pulmonary vascular congestion. Diffuse interstitial and alveolar opacities, most pronounced within the right lower lobe, likely pulmonary edema. Superimposed infection would be difficult to exclude in the appropriate clinical setting. Electronically Signed   By: Davina Poke D.O.   On: 07/07/2022 14:21    Procedures Procedures    Medications Ordered in ED Medications  furosemide (LASIX) injection 40 mg (has no administration in time range)  furosemide (LASIX) injection 40  mg (40 mg Intravenous Given 07/07/22 1447)    ED Course/ Medical Decision Making/ A&P                           Medical Decision Making 76 year old male with history of dementia, CHF, Afib on Eliquis, type 2 diabetes mellitus presented to emergency department for evaluation of increased fatigue, shortness of breath, cough, increase lethargy and confusion.  Patient got sick 3 days ago with gradually increasing fatigue, shortness of breath, lethargy and confusion.  History was provided by the wife who stated patient started coughing Monday and tested positive for COVID-19 Tuesday with home COVID-19 test.  He was prescribed molnupiravir by his PCP Wednesday and he has taken 1 dosage of it.  If also reports patient's urine has an odor and he was incontinent yesterday.  Patient denies abdominal pain, dysuria or back pain.  On exam patient is alert, he is tachypneic and slightly hypoxic on O2 4 L via [place].  To auscultation diffuse rhonchi with trace bibasilar coarse crackles.  S1-S2 normal.  Abdomen soft, nontender.  No guarding or rebound tenderness.  2+ pitting edema lower extremities.  Differential diagnosis includes pneumonia, CHF with acute exacerbation, acute viral respiratory infection.  I have low suspicion for pulmonary embolism as patient has recent exposure to COVID and his shortness of breath is likely secondary to acute CHF exacerbation. We will do infection work-up and acute CHF exacerbation. Patient will get 40 mg of Lasix IV   Chest x-ray: Diffuse interstitial and alveolar opacities pronounced in the right lower lobe, likely pulmonary edema BNP: 4248 EKG: Sinus or ectopic atrial rhythm.  CBC: No white count. CMP: Elevated glucose and low albumin otherwise unremarkable. COVID 19 results: Positive  Patient will be admitted to medicine for acute CHF exacerbation, shortness of breath and positive COVID-19. Report was given to Dr. Pietro Cassis for continued T of care.    Amount and/or  Complexity of Data Reviewed Labs: ordered. Radiology: ordered.           Final Clinical Impression(s) / ED Diagnoses Final diagnoses:  Acute on chronic congestive  heart failure, unspecified heart failure type ([place])  SOB (shortness of breath)  COVID-19    Rx / DC Orders ED Discharge Orders     None         Teola Bradley, MD 07/07/22 1615    [name] Muskrat, MD 07/07/22 1729

## 2022-07-07 NOTE — ED Triage Notes (Signed)
PT BIB EMS from home. Pt lives with wife. Pt tested COVID+ yesterday. Today pt was weaker than normal, pt ambulates with walker at baseline. EMS reports pt has productive cough. Pt wife c/o urine with odor. Pt has hx of HTN, diabetes, CHF, lymphoma, dementia. Pt has swelling to left leg.  154/106 85 HR 16 Resp CBG 146 PT O2 88% RA while laying flat. 96% on 4L [place] O2 with EMS

## 2022-07-07 NOTE — H&P (Signed)
[facility] History and Physical  [name] ZDG:[id number] DOB: 1946/05/29 DOA: 07/07/2022 PCP: [name], MD  Admitted from: Home Chief Complaint: Shortness of breath  History of Present Illness: [name] is a 76 y.o. male with PMH significant for mild dementia, obesity, OSA, DM2, HTN, HLD, PAF on Eliquis, nonischemic cardiomyopathy, chronic systolic CHF (EF 25 to 59% in July 2023), non-Hodgkin's lymphoma. Patient presented to the ED today with complaint of progressive COVID symptoms. 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 Patient was discharged to SNF where he stayed for 2 weeks and was discharged home.  Per his wife, at home, patient was able to walk with a walker.   From Monday 9/18, patient started having fever, cough, shortness of breath, fatigue.  He tested positive for COVID test done at home on Tuesday.  Wife called her PCP on Wednesday and was prescribed molnupiravir which she received 1 dose of so far.  Patient became more lethargic and hence EMS was called today and he was brought to ED.   In the ED, patient was afebrile, heart rate in 80s, breathing in high 20s, on 4 L oxygen by nasal cannula Labs with WBC 4.8, hemoglobin 12.3, platelet 204, lactic acid normal, sodium 141, potassium 5, BUN/creatinine 23/1.14, albumin low at 3, BNP significantly elevated to 4248. COVID PCR positive Chest x-ray showed cardiomegaly with pulmonary vascular congestion, diffuse interstitial and alveolar opacities, most pronounced within the right lower lobe, likely pulmonary edema.   At the time of my evaluation, patient was somnolent, opens eyes on verbal command, able to answer some questions, gurgling from the throat noted.  Able to maintain O2 sat at 100% on 4 L.  2+ bilateral pedal edema noted.  Wife at bedside who helped with the history.  Review of Systems:  All systems were reviewed and were negative  unless otherwise mentioned in the HPI   Past medical history: Past Medical History:  Diagnosis Date   Chronic systolic CHF (congestive heart failure) ([place])    a. Echo (08/28/13): Mild LVH, EF 35-40%, diffuse HK, moderate to severe LAE.   Diabetes mellitus without complication (HCC)    Diverticulitis    Headache(784.0)    Hyperlipidemia    Hypertension    Lymphoma, non Hodgkin's    sees Dr. [name]    Morbid obesity [facility])    NICM (nonischemic cardiomyopathy) ([place])    a. R/L Heart cath 08/30/13: RA mean 8, RV 30/0, PA 27/12, mean PCWP 9, CO 5.67, CI 1.98; normal coronary arteries   Sleep apnea     Past surgical history: Past Surgical History:  Procedure Laterality Date   CARDIOVERSION N/A 06/28/2019   Procedure: CARDIOVERSION;  Surgeon: Lelon Perla, MD;  Location: [facility];  Service: Cardiovascular;  Laterality: N/A;   COLONOSCOPY  11/26/2021   HERNIA REPAIR     umblical   incarcerated hernia     vental 11/19/08 Dr. [name]   LEFT AND RIGHT HEART CATHETERIZATION WITH CORONARY ANGIOGRAM N/A 08/30/2013   Procedure: LEFT AND RIGHT HEART CATHETERIZATION WITH CORONARY ANGIOGRAM;  Surgeon: [name], MD;  Location: [facility] CATH [facility];  Service: Cardiovascular;  Laterality: N/A;    Social History:  reports that he quit smoking about 22 years ago. His smoking use included cigarettes. He started smoking about 56 years ago. He has a 104.00 pack-year smoking history. He has never used smokeless tobacco. He reports current alcohol use. He reports that he  does not use drugs.  Allergies:  Allergies  Allergen Reactions   Amiodarone Other (See Comments)    Thyroid toxicity   Seroquel [Quetiapine] Anxiety and Other (See Comments)    Hallucinations   Amiodarone and Seroquel [quetiapine]   Family history:  Family History  Problem Relation Age of Onset   Heart failure Mother    Hypertension Mother    Cancer Father        colon   Hypertension Father    Diabetes Father     Stroke Maternal Grandmother    Diabetes Paternal Grandfather    Cancer Maternal Grandfather      Home Meds: Prior to Admission medications   Medication Sig Start Date End Date Taking? Authorizing Provider  acetaminophen (TYLENOL) 325 MG tablet Take 650 mg by mouth 2 (two) times daily as needed (pain).    [provider]  apixaban (ELIQUIS) 5 MG TABS tablet TAKE 1 TABLET BY MOUTH TWICE A DAY Patient taking differently: Take 5 mg by mouth 2 (two) times daily. 08/23/21   Buford Dresser, MD  atorvastatin (LIPITOR) 20 MG tablet TAKE 1 TABLET BY MOUTH EVERY DAY 07/04/22   [name], MD  FLUoxetine (PROZAC) 40 MG capsule TAKE 1 CAPSULE BY MOUTH EVERY DAY Patient taking differently: Take 40 mg by mouth daily. 01/03/22   [name], MD  furosemide (LASIX) 40 MG tablet TAKE 1 TABLET BY MOUTH EVERY DAY 07/04/22   [name], MD  LORazepam (ATIVAN) 0.5 MG tablet Take 1 tablet (0.5 mg total) by mouth every 8 (eight) hours as needed for anxiety. 06/27/22   [name], MD  metFORMIN (GLUCOPHAGE) 500 MG tablet TAKE 1 TABLET BY MOUTH TWICE DAILY WITH A MEAL Patient taking differently: Take 500 mg by mouth 2 (two) times daily with a meal. 09/21/21   [name], MD  methimazole (TAPAZOLE) 10 MG tablet Take 1 tablet (10 mg total) by mouth 3 (three) times daily. 06/27/22   [name], MD  metoprolol succinate (TOPROL-XL) 25 MG 24 hr tablet Take 1 tablet (25 mg total) by mouth daily. 05/20/22   Caren Griffins, MD  molnupiravir EUA (LAGEVRIO) 200 mg CAPS capsule Take 4 capsules (800 mg total) by mouth 2 (two) times daily for 5 days. 07/06/22 07/11/22  Billie Ruddy, MD  Multiple Vitamins-Minerals (MULTIVITAMIN MEN 50+) TABS Take 1 tablet by mouth daily.    [provider]  OLANZapine (ZYPREXA) 2.5 MG tablet Take one at suppertime and one at bedtime every day 06/27/22   [name], MD  spironolactone (ALDACTONE) 25 MG tablet TAKE 1 TABLET BY MOUTH EVERY DAY Patient  taking differently: Take 25 mg by mouth daily. 12/28/21   [name], MD  tamsulosin (FLOMAX) 0.4 MG CAPS capsule Take 1 capsule (0.4 mg total) by mouth daily. 06/27/22   [name], MD    Physical Exam: Vitals:   07/07/22 1445 07/07/22 1500 07/07/22 1515 07/07/22 1530  BP: 117/77 124/82 135/87 134/86  Pulse: 72 73 81 77  Resp: (!) 26 (!) 25 (!) 26 (!) 25  Temp:      TempSrc:      SpO2: 99% 98% 97% 95%   Wt Readings from Last 3 Encounters:  06/07/22 110.9 kg  05/23/22 111.4 kg  05/04/22 120.7 kg   There is no height or weight on file to calculate BMI.  General exam: Pleasant, elderly Caucasian male.  Lying on bed.  Mild respiratory distress Skin:  No rashes, lesions or ulcers. HEENT: Atraumatic, normocephalic, no obvious bleeding Lungs: Diminished air entry in both bases, coughs on deep breathing  CVS: Regular rate and rhythm, no murmur GI/Abd soft, nondistended obesity, nontender, bowel sound present CNS: Some, opens eyes and verbal command, oriented to place and person Psychiatry: Mood appropriate Extremities: 1-2+ bilateral pedal edema, no calf tenderness, no cellulitis or open wound     Consult Orders  (From admission, onward)           Start     Ordered   07/07/22 1638  PT eval and treat  Routine        07/07/22 1637   07/07/22 1528  Consult to hospitalist  [phone]  Once       Comments: [phone]  Provider:  (Not yet assigned)  Question Answer Comment  Place call to: [facility]   Reason for Consult Admit      07/07/22 1527            Labs on Admission:   CBC: Recent Labs  Lab 07/07/22 1325  WBC 4.8  NEUTROABS 4.2  HGB 12.3*  HCT 39.3  MCV 89.5  PLT 017    Basic Metabolic Panel: Recent Labs  Lab 07/07/22 1325  NA 141  K 5.0  CL 111  CO2 22  GLUCOSE 143*  BUN 23  CREATININE 1.14  CALCIUM 8.3*    Liver Function Tests: Recent Labs  Lab 07/07/22 1325  AST 22  ALT 14  ALKPHOS 64  BILITOT 0.9  PROT 6.4*   ALBUMIN 3.0*   No results for input(s): "LIPASE", "AMYLASE" in the last 168 hours. No results for input(s): "AMMONIA" in the last 168 hours.  Cardiac Enzymes: No results for input(s): "CKTOTAL", "CKMB", "CKMBINDEX", "TROPONINI" in the last 168 hours.  BNP (last 3 results) Recent Labs    05/06/22 1140 05/12/22 1253 07/07/22 1351  BNP 2,258.1* 1,314.2* 4,248.8*    ProBNP (last 3 results) No results for input(s): "PROBNP" in the last 8760 hours.  CBG: No results for input(s): "GLUCAP" in the last 168 hours.  Lipase     Component Value Date/Time   LIPASE 22 11/19/2008 1943     Urinalysis    Component Value Date/Time   COLORURINE AMBER (A) 05/06/2022 1150   APPEARANCEUR CLEAR 05/06/2022 1150   LABSPEC 1.017 05/06/2022 1150   PHURINE 5.0 05/06/2022 1150   GLUCOSEU NEGATIVE 05/06/2022 1150   GLUCOSEU NEGATIVE 07/14/2021 1046   HGBUR NEGATIVE 05/06/2022 1150   HGBUR large 10/23/2007 1455   BILIRUBINUR NEGATIVE 05/06/2022 1150   BILIRUBINUR N 03/02/2018 1655   KETONESUR NEGATIVE 05/06/2022 1150   PROTEINUR 100 (A) 05/06/2022 1150   UROBILINOGEN 0.2 07/14/2021 1046   NITRITE POSITIVE (A) 05/06/2022 1150   LEUKOCYTESUR TRACE (A) 05/06/2022 1150     Drugs of Abuse     Component Value Date/Time   LABOPIA NONE DETECTED 05/06/2022 1150   COCAINSCRNUR NONE DETECTED 05/06/2022 1150   LABBENZ NONE DETECTED 05/06/2022 1150   AMPHETMU NONE DETECTED 05/06/2022 1150   THCU NONE DETECTED 05/06/2022 1150   LABBARB NONE DETECTED 05/06/2022 1150      Radiological Exams on Admission: DG Chest Port 1 View  Result Date: 07/07/2022 CLINICAL DATA:  Shortness of breath EXAM: PORTABLE CHEST 1 VIEW COMPARISON:  05/19/2022 FINDINGS: Cardiomegaly. Aortic atherosclerosis. Pulmonary vascular congestion. Diffuse interstitial and alveolar opacities, most pronounced within the right lower lobe. Probable small bilateral pleural effusions. No pneumothorax. IMPRESSION: Cardiomegaly with  pulmonary vascular congestion. Diffuse  interstitial and alveolar opacities, most pronounced within the right lower lobe, likely pulmonary edema. Superimposed infection would be difficult to exclude in the appropriate clinical setting. Electronically Signed   By: Davina Poke D.O.   On: 07/07/2022 14:21     ------------------------------------------------------------------------------------------------------ Assessment/Plan: Principal Problem:   CHF exacerbation ([place]) Active Problems:   Pneumonia due to COVID-19 virus   Essential hypertension   NICM (nonischemic cardiomyopathy) (HCC)   DM2 (diabetes mellitus, type 2) (HCC)   PAF (paroxysmal atrial fibrillation) (HCC)   Chronic edema   Chronic anticoagulation   Dementia with behavioral disturbance (HCC)  Acute respiratory failure with hypoxia Primary due to CHF exacerbation and COVID 19 and infection.  Also contributed by OSA. Required 4 L oxygen by nasal cannula on admission.  Wean down as tolerated See management of individual issues below  Acute exacerbation of, systolic and diastolic CHF Nonischemic cardiomyopathy Essential hypertension Presented with shortness of breath, 1-2+ bilateral pedal edema Chest x-ray with cardiomegaly, pulm vascular congestion and diffuse interstitial and alveolar opacities BNP significantly elevated over 4000 Echo from July 2023 showed EF of 25 to 30% with global hypokinesis, grade 2 diastolic dysfunction 1 dose of IV Lasix 40 mg was given in the ED. Continue Lasix 40 mg IV twice daily. PTA on Toprol 25 mg daily, Lasix 40 mg daily, Aldactone 25 mg daily Resume Toprol.  Keep Aldactone on hold. Net IO Since Admission: No IO data has been entered for this period [07/07/22 1641] Continue to monitor for daily intake output, weight, blood pressure, BNP, renal function and electrolytes. Recent Labs  Lab 07/07/22 1325 07/07/22 1351  BNP  --  4,248.8*  BUN 23  --   CREATININE 1.14  --   K 5.0  --     COVID pneumonia Presented with worsening symptoms of COVID-19 infection COVID test: Positive at home on Tuesday, positive in ED today Chest imaging: Chest x-ray as above mostly with pulm edema but could be a mix of COVID-19 infiltrates as well. Treatment: Started on molnupiravir as an outpatient by PCP on Wednesday.  Will resume the same.  We will start on Solu-Medrol IV as well because of hypoxia Supportive care: Vitamin C, Zinc, PRN inhalers, Tylenol, Antitussives (benzonatate/ Mucinex/Tussionex). Encouraged incentive spirometry, prone position, out of bed and early mobilization as much as possible WBC and inflammatory markers trend as below. Recent Labs  Lab 07/07/22 1325 07/07/22 1356  SARSCOV2NAA  --  POSITIVE*  WBC 4.8  --   LATICACIDVEN 1.5  --   ALT 14  --    Amiodarone-induced hypERthyroidism Recently hospitalized from 7/21 to 8/7 for altered mental status due to amiodarone-induced.  Amiodarone was stopped at the time and patient was started on methimazole.  Patient's wife states that he is still taking methimazole and has not yet followed up with endocrinologist. Obtain TSH and free T4 level. Recent Labs    07/14/21 1046 05/06/22 1012  TSH 3.25 <0.010*   Type 2 diabetes mellitus A1c 5.4 from 2022.  Repeat A1c PTA on metformin 5 mg twice daily.  Not on insulin Keep metformin on hold.  Start sliding scale insulin with Accu-Cheks. Lab Results  Component Value Date   HGBA1C 5.4 07/14/2021   No results for input(s): "GLUCAP" in the last 168 hours.  PAF On Toprol and Eliquis Resume both  Hyperlipidemia Continue statin  Dementia with behavioral disturbance Anxiety/depression PTA on Zyprexa 2.5 mg nightly as needed, Ativan 0.5 mg 3 times daily as needed, Prozac 40 mg daily  Resume both  BPH Flomax  Morbid obesity  -There is no height or weight on file to calculate BMI. Patient has been advised to make an attempt to improve diet and exercise patterns to aid in  weight loss.  OSA  History of Non-Hodgkin's lymphoma.  Impaired mobility At home, patient at baseline is able to ambulate with a walker PT eval ordered   Goals of care - -  Code Status: DNR.  Confirmed with wife at bedside  Diet:  Diet Order             Diet 2 gram sodium Room service appropriate? Yes; Fluid consistency: Thin  Diet effective now                  DVT prophylaxis:  apixaban (ELIQUIS) tablet 5 mg   Antimicrobials: None Fluid: None Consultants: None Family Communication: Wife at bedside Dispo: The patient is from: Home              Anticipated d/c is to: Pending clinical course  ------------------------------------------------------------------------------------- Severity of Illness: The appropriate patient status for this patient is INPATIENT. Inpatient status is judged to be reasonable and necessary in order to provide the required intensity of service to ensure the patient's safety. The patient's presenting symptoms, physical exam findings, and initial radiographic and laboratory data in the context of their chronic comorbidities is felt to place them at high risk for further clinical deterioration. Furthermore, it is not anticipated that the patient will be medically stable for discharge from the hospital within 2 midnights of admission.   * I certify that at the point of admission it is my clinical judgment that the patient will require inpatient hospital care spanning beyond 2 midnights from the point of admission due to high intensity of service, high risk for further deterioration and high frequency of surveillance required.*  Signed, Terrilee Croak, MD [facility] 07/07/2022

## 2022-07-08 DIAGNOSIS — I5043 Acute on chronic combined systolic (congestive) and diastolic (congestive) heart failure: Secondary | ICD-10-CM | POA: Diagnosis not present

## 2022-07-08 LAB — CBC
HCT: 37.1 % — ABNORMAL LOW (ref 39.0–52.0)
Hemoglobin: 11.7 g/dL — ABNORMAL LOW (ref 13.0–17.0)
MCH: 28.1 pg (ref 26.0–34.0)
MCHC: 31.5 g/dL (ref 30.0–36.0)
MCV: 89.2 fL (ref 80.0–100.0)
Platelets: 189 10*3/uL (ref 150–400)
RBC: 4.16 MIL/uL — ABNORMAL LOW (ref 4.22–5.81)
RDW: 16.2 % — ABNORMAL HIGH (ref 11.5–15.5)
WBC: 4.7 10*3/uL (ref 4.0–10.5)
nRBC: 0 % (ref 0.0–0.2)

## 2022-07-08 LAB — BASIC METABOLIC PANEL
Anion gap: 6 (ref 5–15)
BUN: 23 mg/dL (ref 8–23)
CO2: 25 mmol/L (ref 22–32)
Calcium: 8.2 mg/dL — ABNORMAL LOW (ref 8.9–10.3)
Chloride: 109 mmol/L (ref 98–111)
Creatinine, Ser: 1.11 mg/dL (ref 0.61–1.24)
GFR, Estimated: >60 mL/min (ref >60)
Glucose, Bld: 130 mg/dL — ABNORMAL HIGH (ref 70–99)
Potassium: 4.8 mmol/L (ref 3.5–5.1)
Sodium: 140 mmol/L (ref 135–145)

## 2022-07-08 LAB — GLUCOSE, CAPILLARY
Glucose-Capillary: 104 mg/dL — ABNORMAL HIGH (ref 70–99)
Glucose-Capillary: 99 mg/dL (ref 70–99)
Glucose-Capillary: 122 mg/dL — ABNORMAL HIGH (ref 70–99)
Glucose-Capillary: 102 mg/dL — ABNORMAL HIGH (ref 70–99)

## 2022-07-08 LAB — BRAIN NATRIURETIC PEPTIDE: B Natriuretic Peptide: 4448.2 pg/mL — ABNORMAL HIGH (ref 0.0–100.0)

## 2022-07-08 MED ORDER — ALBUTEROL SULFATE (2.5 MG/3ML) 0.083% IN NEBU
2.5000 mg | INHALATION_SOLUTION | Freq: Two times a day (BID) | RESPIRATORY_TRACT | Status: DC
  Administered 2022-07-08 – 2022-07-09 (×3): 2.5 mg via RESPIRATORY_TRACT
  Filled 2022-07-08 (×3): qty 3

## 2022-07-08 MED ORDER — FUROSEMIDE 10 MG/ML IJ SOLN
80.0000 mg | Freq: Two times a day (BID) | INTRAMUSCULAR | Status: DC
  Administered 2022-07-08 (×2): 80 mg via INTRAVENOUS
  Filled 2022-07-08 (×2): qty 8

## 2022-07-08 MED ORDER — METHIMAZOLE 10 MG PO TABS
10.0000 mg | ORAL_TABLET | Freq: Three times a day (TID) | ORAL | Status: DC
  Administered 2022-07-08 – 2022-07-19 (×33): 10 mg via ORAL
  Filled 2022-07-08 (×37): qty 1

## 2022-07-08 MED ORDER — ALBUTEROL SULFATE (2.5 MG/3ML) 0.083% IN NEBU
2.5000 mg | INHALATION_SOLUTION | RESPIRATORY_TRACT | Status: DC | PRN
  Administered 2022-07-11: 2.5 mg via RESPIRATORY_TRACT
  Filled 2022-07-08: qty 3

## 2022-07-08 MED ORDER — ZINC OXIDE 12.8 % EX OINT
TOPICAL_OINTMENT | Freq: Two times a day (BID) | CUTANEOUS | Status: DC
  Administered 2022-07-16: 1 via TOPICAL
  Filled 2022-07-08 (×2): qty 56.7

## 2022-07-08 MED ORDER — ALBUTEROL SULFATE (2.5 MG/3ML) 0.083% IN NEBU
2.5000 mg | INHALATION_SOLUTION | Freq: Three times a day (TID) | RESPIRATORY_TRACT | Status: DC
  Administered 2022-07-08: 2.5 mg via RESPIRATORY_TRACT
  Filled 2022-07-08: qty 3

## 2022-07-08 MED ORDER — PANTOPRAZOLE SODIUM 40 MG PO TBEC
40.0000 mg | DELAYED_RELEASE_TABLET | Freq: Every day | ORAL | Status: DC
  Administered 2022-07-08 – 2022-07-18 (×11): 40 mg via ORAL
  Filled 2022-07-08 (×11): qty 1

## 2022-07-08 NOTE — Consult Note (Signed)
WOC Nurse Consult Note: Patient receiving care in [place]. In completing the consult for this Covid + patient, I spoke via telephone with the nightshift nurse, Adrianne, and obtained the following information. Reason for Consult: sacral wound Wound type: per the details provided by Adrianne, the wound is at the top of the intergluteal cleft close to the area of the coccyx. It is pink with peeling skin. There is no maroon, purple, brown, or black discoloration to the site.  It looks like a blister that the skin peeled off of.  This would be consistent with a stage 2 PI. Pressure Injury POA: Yes Measurement: approximately quarter size Wound bed: pink Drainage (amount, consistency, odor) none Periwound: intact. There is also what she describes as reddened tissue deep in the intergluteal cleft area and on the scrotum, consistent with Moisture Associated Skin Damage. Dressing procedure/placement/frequency: Wash the wound area at the coccyx area (top of the intergluteal cleft) with soap and water, pat dry. Place a small piece of Xeroform gauze Kellie Simmering [phone]) over the superficial area with peeling skin, then apply a foam dressing. Change the Xeroform daily and the foam every 3 days and prn.  For the MASD I have ordered twice daily application of Triple Paste.  Monitor the wound area(s) for worsening of condition such as: Signs/symptoms of infection,  Increase in size,  Development of or worsening of odor, Development of pain, or increased pain at the affected locations.  Notify the medical team if any of these develop.  Thank you for the consult.  Discussed plan of care with the bedside nurse.  [place] nurse will not follow at this time.  Please re-consult the [place] team if needed.  Val Riles, RN, MSN, CWOCN, CNS-BC, pager [phone]

## 2022-07-08 NOTE — H&P (Signed)
PROGRESS NOTE  [name]  DOB: August 28, 1946  PCP: [name], MD JYN:[id number]  DOA: 07/07/2022  LOS: 1 day  Hospital Day: 2  Brief narrative: [name] is a 76 y.o. male with PMH significant for mild dementia, obesity, OSA, DM2, HTN, HLD, PAF on Eliquis, nonischemic cardiomyopathy, chronic systolic CHF (EF 25 to 86% in July 2023), non-Hodgkin's lymphoma. Patient presented to the ED today with complaint of progressive COVID symptoms. 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 Patient was discharged to SNF where he stayed for 2 weeks and was discharged home.  Per his wife, at home, patient was able to walk with a walker.   From Monday 9/18, patient started having fever, cough, shortness of breath, fatigue.  He tested positive for COVID test done at home on Tuesday.  Wife called her PCP on Wednesday and was prescribed molnupiravir which she received 1 dose of so far.  Patient became more lethargic and hence EMS was called today and he was brought to ED.   In the ED, patient was afebrile, heart rate in 80s, breathing in high 20s, on 4 L oxygen by nasal cannula Labs with WBC 4.8, hemoglobin 12.3, platelet 204, lactic acid normal, sodium 141, potassium 5, BUN/creatinine 23/1.14, albumin low at 3, BNP significantly elevated to 4248. COVID PCR positive Chest x-ray showed cardiomegaly with pulmonary vascular congestion, diffuse interstitial and alveolar opacities, most pronounced within the right lower lobe, likely pulmonary edema.  Patient was started on IV Lasix. Admitted to hospitalist service  Subjective: Patient was seen and examined this morning.  Pleasant elderly Caucasian male.  Propped up in bed.  Somnolent.  Not in distress.  Opens eyes and verbal command.  Wife not at bedside  Assessment/Plan: Acute respiratory failure with hypoxia Primary due to CHF exacerbation and COVID 19 and infection.  Also  contributed by OSA. Required 4 L oxygen by nasal cannula on admission.  Wean down as tolerated See management of individual issues below  Acute exacerbation of, systolic and diastolic CHF Nonischemic cardiomyopathy Essential hypertension Presented with shortness of breath, 1-2+ bilateral pedal edema Chest x-ray with cardiomegaly, pulm vascular congestion and diffuse interstitial and alveolar opacities BNP significantly elevated over 4000 Echo from July 2023 showed EF of 25 to 30% with global hypokinesis, grade 2 diastolic dysfunction 1 dose of IV Lasix 40 mg was given in the ED. BNP this morning remains further elevated to more than 4400. Currently on Lasix 80 mg IV twice daily. PTA on Toprol 25 mg daily, Lasix 40 mg daily, Aldactone 25 mg daily Resume Toprol.  Keep Aldactone on hold. Net IO Since Admission: -1,140 mL [07/08/22 1440] Continue to monitor for daily intake output, weight, blood pressure, BNP, renal function and electrolytes. Recent Labs  Lab 07/07/22 1325 07/07/22 1351 07/08/22 0507  BNP  --  4,248.8* 4,448.2*  BUN 23  --  23  CREATININE 1.14  --  1.11  K 5.0  --  4.8   COVID pneumonia Presented with worsening symptoms of COVID-19 infection COVID test: Positive at home on Tuesday, positive in ED today Chest imaging: Chest x-ray as above mostly with pulm edema but could be a mix of COVID-19 infiltrates as well. Treatment: Started on molnupiravir as an outpatient by PCP on Wednesday.  Will resume the same.  We will start on Solu-Medrol IV as well because of hypoxia Supportive care: Vitamin C, Zinc, PRN inhalers, Tylenol, Antitussives (benzonatate/ Mucinex/Tussionex). Encouraged incentive spirometry,  prone position, out of bed and early mobilization as much as possible WBC and inflammatory markers trend as below. Recent Labs  Lab 07/07/22 1325 07/07/22 1356 07/08/22 0507  SARSCOV2NAA  --  POSITIVE*  --   WBC 4.8  --  4.7  LATICACIDVEN 1.5  --   --   ALT 14  --    --    Amiodarone-induced hypERthyroidism Recently hospitalized from 7/21 to 8/7 for altered mental status due to amiodarone-induced.  Amiodarone was stopped at the time and patient was started on methimazole.  Patient's wife states that he is still taking methimazole and has not yet followed up with endocrinologist. TSH and free T4 level both seem to be improving but still not in target range.  We will continue methimazole. Recent Labs    07/14/21 1046 05/06/22 1012 07/07/22 1325  TSH 3.25 <0.010* 0.157*   Type 2 diabetes mellitus A1c 5.7 on 9/21 PTA on metformin 5 mg twice daily.  Not on insulin Keep metformin on hold.  Continue sliding scale insulin with Accu-Cheks. Recent Labs  Lab 07/07/22 2254 07/08/22 0808 07/08/22 1157  GLUCAP 122* 104* 99    PAF On Toprol and Eliquis Resume both  Hyperlipidemia Continue statin  Dementia with behavioral disturbance Anxiety/depression PTA on Zyprexa 2.5 mg nightly as needed, Ativan 0.5 mg 3 times daily as needed, Prozac 40 mg daily Continue both  BPH Continue Flomax  Morbid obesity  -Body mass index is 32.5 kg/m. Patient has been advised to make an attempt to improve diet and exercise patterns to aid in weight loss.  OSA Nightly CPAP  History of Non-Hodgkin's lymphoma.  Impaired mobility At home, patient at baseline is able to ambulate with a walker PT eval ordered  Stage II sacral decubitus ulcer Wound care consult appreciated  Goals of care   Code Status: DNR.    Diet:  Diet Order             Diet 2 gram sodium Room service appropriate? Yes; Fluid consistency: Thin  Diet effective now                  DVT prophylaxis:  apixaban (ELIQUIS) tablet 5 mg   Antimicrobials: None Fluid: None Consultants: None Family Communication: Wife at bedside  Status is: Inpatient  Continue in-hospital care because: Needs further IV diuresis, PT eval Level of care: Progressive   Dispo: The patient is from: Home               Anticipated d/c is to: Pending clinical course, pending PT eval              Patient currently is not medically stable to d/c.   Difficult to place patient No     Infusions:    Scheduled Meds:  albuterol  2.5 mg Nebulization BID   apixaban  5 mg Oral BID   vitamin C  500 mg Oral Daily   atorvastatin  20 mg Oral Daily   FLUoxetine  40 mg Oral Daily   furosemide  80 mg Intravenous BID   guaiFENesin  600 mg Oral BID   insulin aspart  0-5 Units Subcutaneous QHS   insulin aspart  0-9 Units Subcutaneous TID WC   melatonin  3 mg Oral QHS   methimazole  10 mg Oral TID   methylPREDNISolone (SOLU-MEDROL) injection  40 mg Intravenous Daily   metoprolol succinate  25 mg Oral Daily   molnupiravir EUA  4 capsule Oral BID   OLANZapine  2.5 mg Oral BID   pantoprazole  40 mg Oral QHS   senna  1 tablet Oral BID   tamsulosin  0.4 mg Oral Daily   Zinc Oxide   Topical BID   zinc sulfate  220 mg Oral Daily    PRN meds: acetaminophen **OR** acetaminophen, albuterol, hydrALAZINE, LORazepam, ondansetron **OR** ondansetron (ZOFRAN) IV, polyethylene glycol   Antimicrobials: Anti-infectives (From admission, onward)    Start     Dose/Rate Route Frequency Ordered Stop   07/07/22 1800  molnupiravir EUA (LAGEVRIO) capsule 800 mg        4 capsule Oral 2 times daily 07/07/22 1640 07/12/22 2159       Objective: Vitals:   07/08/22 0810 07/08/22 1203  BP:  117/65  Pulse:  (!) 54  Resp:  17  Temp:  98.1 F (36.7 C)  SpO2: 99% 99%    Intake/Output Summary (Last 24 hours) at 07/08/2022 1440 Last data filed at 07/08/2022 1300 Gross per 24 hour  Intake 360 ml  Output 1500 ml  Net -1140 ml   Filed Weights   07/07/22 1812  Weight: 117.9 kg   Weight change:  Body mass index is 32.5 kg/m.   Physical Exam: General exam: Pleasant, elderly Caucasian male.  Lying on bed.  Somnolent.  Opens eyes and verbal command.  Skin: No rashes, lesions or ulcers. HEENT: Atraumatic,  normocephalic, no obvious bleeding Lungs: Diminished air entry in both bases, coughs on deep breathing  CVS: Regular rate and rhythm, no murmur GI/Abd soft, nondistended obesity, nontender, bowel sound present CNS: Somnolent, opens eyes and verbal command, oriented to place and person Psychiatry: Mood appropriate Extremities: 1-2+ bilateral pedal edema, no calf tenderness, no cellulitis or open wound  Data Review: I have personally reviewed the laboratory data and studies available.  F/u labs ordered Unresulted Labs (From admission, onward)     Start     Ordered   07/09/22 8889  Basic metabolic panel  Daily at 5am,   R      07/08/22 1440   07/09/22 0500  CBC with Differential/Platelet  Daily at 5am,   R      07/08/22 1440   07/08/22 0500  Brain natriuretic peptide  Daily at 5am,   R      07/07/22 1630   07/07/22 1328  Urinalysis, Routine w reflex microscopic  Once,   URGENT        07/07/22 1327            Signed, Terrilee Croak, MD [facility] 07/08/2022

## 2022-07-08 NOTE — Evaluation (Signed)
Physical Therapy Evaluation Patient Details Name: [name] MRN: [id number] DOB: 06-18-1946 Today's Date: 07/08/2022  History of Present Illness  [name] is a 76 y.o. male admitted with CHF exacerbation. Pt positive with covid19. PMH: CHF, diabetes, dementia  Clinical Impression  Pt admitted with above diagnosis. Pt's spouse providing all info, states pt recently home from SNF, was able to navigate 3 STE, ind with self care and using RW for in home ambulation. Pt currently difficult to arouse and maintain arousal, able to wake and make basic needs known. Pt able to place BLE into hooklying position to assist with rolling R/L and to scoot up in bed with bed scoot assist and use of bed pad with max A from therapist. Recommend ST SNF for strengthening prior to return home with spouse to assist. Pt currently with functional limitations due to the deficits listed below (see PT Problem List). Pt will benefit from skilled PT to increase their independence and safety with mobility to allow discharge to the venue listed below.          Recommendations for follow up therapy are one component of a multi-disciplinary discharge planning process, led by the attending physician.  Recommendations may be updated based on patient status, additional functional criteria and insurance authorization.  Follow Up Recommendations Skilled nursing-short term rehab (<3 hours/day) Can patient physically be transported by private vehicle: No    Assistance Recommended at Discharge Frequent or constant Supervision/Assistance  Patient can return home with the following  Two people to help with walking and/or transfers;Two people to help with bathing/dressing/bathroom;Assistance with feeding;Assist for transportation;Help with stairs or ramp for entrance;Direct supervision/assist for medications management;Assistance with cooking/housework;Direct supervision/assist for financial management    Equipment  Recommendations Other (comment) (TBD)  Recommendations for Other Services       Functional Status Assessment Patient has had a recent decline in their functional status and demonstrates the ability to make significant improvements in function in a reasonable and predictable amount of time.     Precautions / Restrictions Precautions Precautions: Fall Restrictions Weight Bearing Restrictions: No      Mobility  Bed Mobility Overal bed mobility: Needs Assistance Bed Mobility: Rolling Rolling: Max assist  General bed mobility comments: max A to roll towards R and L, cues for hooklying position to assist in rotating trunk, max A with bed boost assist to scoot pt up in bed with bed pad    Transfers  General transfer comment: not attempted due to decreased alertness    Ambulation/Gait   Stairs            Wheelchair Mobility    Modified Rankin (Stroke Patients Only)       Balance       Pertinent Vitals/Pain Pain Assessment Pain Assessment: No/denies pain    Home Living Family/patient expects to be discharged to:: Private residence Living Arrangements: Spouse/significant other Available Help at Discharge: Family;Available 24 hours/day Type of Home: House Home Access: Stairs to enter Entrance Stairs-Rails: Right;Left;Can reach both Entrance Stairs-Number of Steps: 3   Home Layout: Multi-level;Able to live on main level with bedroom/bathroom Home Equipment: Rolling Walker (2 wheels);Shower seat      Prior Function Prior Level of Function : Independent/Modified Independent  Mobility Comments: spouse reports pt home from rehab this month, able to ambulate household distances with RW and complete transfers ind ADLs Comments: spouse reports pt home from rehab this month, pt ind with feeding, toileting, bathing, dressing     Hand Dominance  Extremity/Trunk Assessment   Upper Extremity Assessment Upper Extremity Assessment: Generalized weakness     Lower Extremity Assessment Lower Extremity Assessment: Generalized weakness       Communication      Cognition Arousal/Alertness: Lethargic Behavior During Therapy: Flat affect Overall Cognitive Status: Impaired/Different from baseline  General Comments: Pt lethargic in bed, spouse at bedside states pt not at baseline, difficult to wake and maintain alertness, engages with therapist minimally to make basic needs known        General Comments General comments (skin integrity, edema, etc.): Pt on 3L O2 with SpO2 100%    Exercises     Assessment/Plan    PT Assessment Patient needs continued PT services  PT Problem List Decreased strength;Decreased activity tolerance;Decreased balance;Decreased mobility;Decreased cognition;Decreased knowledge of use of DME;Cardiopulmonary status limiting activity;Obesity       PT Treatment Interventions DME instruction;Gait training;Functional mobility training;Patient/family education;Therapeutic activities;Therapeutic exercise;Balance training;Modalities    PT Goals (Current goals can be found in the Care Plan section)  Acute Rehab PT Goals Patient Stated Goal: spouse reports goal is to ultimately return home, possibly SNF prior if she can't assist pt PT Goal Formulation: With family Time For Goal Achievement: 07/22/22 Potential to Achieve Goals: Good    Frequency Min 2X/week     Co-evaluation               AM-PAC PT "6 Clicks" Mobility  Outcome Measure Help needed turning from your back to your side while in a flat bed without using bedrails?: Total Help needed moving from lying on your back to sitting on the side of a flat bed without using bedrails?: Total Help needed moving to and from a bed to a chair (including a wheelchair)?: Total Help needed standing up from a chair using your arms (e.g., wheelchair or bedside chair)?: Total Help needed to walk in hospital room?: Total Help needed climbing 3-5 steps with a railing? :  Total 6 Click Score: 6    End of Session Equipment Utilized During Treatment: Oxygen Activity Tolerance: Patient limited by lethargy Patient left: in bed;with call bell/phone within reach;with bed alarm set;with family/visitor present Nurse Communication: Mobility status PT Visit Diagnosis: Other abnormalities of gait and mobility (R26.89);Muscle weakness (generalized) (M62.81)    Time: 6659-9357 PT Time Calculation (min) (ACUTE ONLY): 29 min   Charges:   PT Evaluation $PT Eval Moderate Complexity: 1 Mod PT Treatments $Therapeutic Activity: 8-22 mins         Tori Dietrich Samuelson PT, DPT 07/08/22, 3:23 PM

## 2022-07-08 NOTE — Progress Notes (Signed)
Dr Sabino Gasser made aware patient has crackle lung sounds and labored breathing.

## 2022-07-09 DIAGNOSIS — L8990 Pressure ulcer of unspecified site, unspecified stage: Secondary | ICD-10-CM | POA: Insufficient documentation

## 2022-07-09 DIAGNOSIS — I5043 Acute on chronic combined systolic (congestive) and diastolic (congestive) heart failure: Secondary | ICD-10-CM | POA: Diagnosis not present

## 2022-07-09 LAB — CBC WITH DIFFERENTIAL/PLATELET
Abs Immature Granulocytes: 0.02 10*3/uL (ref 0.00–0.07)
Basophils Absolute: 0 10*3/uL (ref 0.0–0.1)
Basophils Relative: 0 %
Eosinophils Absolute: 0 10*3/uL (ref 0.0–0.5)
Eosinophils Relative: 0 %
HCT: 35.6 % — ABNORMAL LOW (ref 39.0–52.0)
Hemoglobin: 11.3 g/dL — ABNORMAL LOW (ref 13.0–17.0)
Immature Granulocytes: 0 %
Lymphocytes Relative: 11 %
Lymphs Abs: 0.5 10*3/uL — ABNORMAL LOW (ref 0.7–4.0)
MCH: 28.4 pg (ref 26.0–34.0)
MCHC: 31.7 g/dL (ref 30.0–36.0)
MCV: 89.4 fL (ref 80.0–100.0)
Monocytes Absolute: 0.4 10*3/uL (ref 0.1–1.0)
Monocytes Relative: 8 %
Neutro Abs: 3.6 10*3/uL (ref 1.7–7.7)
Neutrophils Relative %: 81 %
Platelets: 226 10*3/uL (ref 150–400)
RBC: 3.98 MIL/uL — ABNORMAL LOW (ref 4.22–5.81)
RDW: 16 % — ABNORMAL HIGH (ref 11.5–15.5)
WBC: 4.5 10*3/uL (ref 4.0–10.5)
nRBC: 0 % (ref 0.0–0.2)

## 2022-07-09 LAB — URINALYSIS, ROUTINE W REFLEX MICROSCOPIC
Bilirubin Urine: NEGATIVE
Glucose, UA: NEGATIVE mg/dL
Hgb urine dipstick: NEGATIVE
Ketones, ur: NEGATIVE mg/dL
Leukocytes,Ua: NEGATIVE
Nitrite: NEGATIVE
Protein, ur: NEGATIVE mg/dL
Specific Gravity, Urine: 1.006 (ref 1.005–1.030)
pH: 5 (ref 5.0–8.0)

## 2022-07-09 LAB — BRAIN NATRIURETIC PEPTIDE: B Natriuretic Peptide: 2417.7 pg/mL — ABNORMAL HIGH (ref 0.0–100.0)

## 2022-07-09 LAB — GLUCOSE, CAPILLARY
Glucose-Capillary: 101 mg/dL — ABNORMAL HIGH (ref 70–99)
Glucose-Capillary: 90 mg/dL (ref 70–99)
Glucose-Capillary: 126 mg/dL — ABNORMAL HIGH (ref 70–99)
Glucose-Capillary: 118 mg/dL — ABNORMAL HIGH (ref 70–99)

## 2022-07-09 LAB — BASIC METABOLIC PANEL
Anion gap: 8 (ref 5–15)
BUN: 29 mg/dL — ABNORMAL HIGH (ref 8–23)
CO2: 30 mmol/L (ref 22–32)
Calcium: 8.2 mg/dL — ABNORMAL LOW (ref 8.9–10.3)
Chloride: 104 mmol/L (ref 98–111)
Creatinine, Ser: 1.29 mg/dL — ABNORMAL HIGH (ref 0.61–1.24)
GFR, Estimated: 57 mL/min — ABNORMAL LOW (ref >60)
Glucose, Bld: 106 mg/dL — ABNORMAL HIGH (ref 70–99)
Potassium: 4.1 mmol/L (ref 3.5–5.1)
Sodium: 142 mmol/L (ref 135–145)

## 2022-07-09 NOTE — Progress Notes (Signed)
Pt has remained out of restraints for the duration of this shift. He has had a positive response to sitting in the chair and has been weaned off his O2. O2 stats are 94% on RA. Pt ambulated to the bathroom, with 1x assist (with walker) to have a BM.   Tele sitter was placed at bedside to monitor patient for safety. The tele sitter has been a successful implementation during this shift.

## 2022-07-09 NOTE — Progress Notes (Signed)
This CSW spoke with Mickel Baas, pt's daughter, who stated they are agreeable for the pt to go to SNF. This CSW provided Mickel Baas with the Medicare.gov site to look over to decide a preferred facility if necessary. Pt has been sent out and bed offers are pending. TOC following.

## 2022-07-09 NOTE — Progress Notes (Signed)
PROGRESS NOTE  [name]  DOB: 08-23-1946  PCP: [name], MD SWN:[id number]  DOA: 07/07/2022  LOS: 2 days  Hospital Day: 3  Brief narrative: [name] is a 76 y.o. male with PMH significant for mild dementia, obesity, OSA, DM2, HTN, HLD, PAF on Eliquis, nonischemic cardiomyopathy, chronic systolic CHF (EF 25 to 93% in July 2023), non-Hodgkin's lymphoma. Patient presented to the ED today with complaint of progressive COVID symptoms. 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 Patient was discharged to SNF where he stayed for 2 weeks and was discharged home.  Per his wife, at home, patient was able to walk with a walker.   From Monday 9/18, patient started having fever, cough, shortness of breath, fatigue.  He tested positive for COVID test done at home on Tuesday.  Wife called her PCP on Wednesday and was prescribed molnupiravir which she received 1 dose of so far.  Patient became more lethargic and hence EMS was called today and he was brought to ED.   In the ED, patient was afebrile, heart rate in 80s, breathing in high 20s, on 4 L oxygen by nasal cannula Labs with WBC 4.8, hemoglobin 12.3, platelet 204, lactic acid normal, sodium 141, potassium 5, BUN/creatinine 23/1.14, albumin low at 3, BNP significantly elevated to 4248. COVID PCR positive Chest x-ray showed cardiomegaly with pulmonary vascular congestion, diffuse interstitial and alveolar opacities, most pronounced within the right lower lobe, likely pulmonary edema.  Patient was started on IV Lasix. Admitted to hospitalist service  Subjective: Patient was seen and examined this morning.   Pleasant elderly sitting up in plan.  Not in distress.  No new symptoms.  99% oxygen on 3 L by nasal cannula.  Wife not at bedside.  Per nurse staff, patient was agitated and pulling lines out last night.  Seems calm this morning.  Assessment/Plan: Acute  exacerbation of, systolic and diastolic CHF Nonischemic cardiomyopathy Essential hypertension Presented with shortness of breath, 1-2+ bilateral pedal edema Chest x-ray with cardiomegaly, pulm vascular congestion and diffuse interstitial and alveolar opacities BNP was significantly elevated over 4000 Echo from July 2023 showed EF of 25 to 30% with global hypokinesis, grade 2 diastolic dysfunction Diuresed with IV Lasix.  About 5 L of output in last 48 hours with significant improvement in pedal edema.  Creatinine peaked up this morning and hence I stopped IV Lasix. PTA on Toprol 25 mg daily, Lasix 40 mg daily, Aldactone 25 mg daily Continue Toprol.  Aldactone remains on hold Net IO Since Admission: -4,940 mL [07/09/22 1532] Continue to monitor for daily intake output, weight, blood pressure, BNP, renal function and electrolytes. Recent Labs  Lab 07/07/22 1325 07/07/22 1351 07/08/22 0507 07/09/22 0505  BNP  --  4,248.8* 4,448.2* 2,417.7*  BUN 23  --  23 29*  CREATININE 1.14  --  1.11 1.29*  K 5.0  --  4.8 4.1   COVID pneumonia Presented with worsening symptoms of COVID-19 infection COVID test: Positive at home on Tuesday, positive in ED on admission Chest imaging: Chest x-ray as above mostly with pulm edema but could be a mix of COVID-19 infiltrates as well. Treatment: Started on molnupiravir as an outpatient by PCP on Wednesday.  Seen was resumed.  Also on IV Solu-Medrol because of hypoxia. Supportive care: Vitamin C, Zinc, PRN inhalers, Tylenol, Antitussives (benzonatate/ Mucinex/Tussionex). Encouraged incentive spirometry, prone position, out of bed and early mobilization as much as possible WBC and inflammatory  markers trend as below. Recent Labs  Lab 07/07/22 1325 07/07/22 1356 07/08/22 0507 07/09/22 0505  SARSCOV2NAA  --  POSITIVE*  --   --   WBC 4.8  --  4.7 4.5  LATICACIDVEN 1.5  --   --   --   ALT 14  --   --   --    Acute respiratory failure with hypoxia Primary  due to CHF exacerbation and COVID 19 and infection.  Also contributed by OSA. Required 4 L oxygen by nasal cannula on admission.  Able to gradually wean down.  AKI BUN/creatinine elevated 29/1.29 today.  With IV Lasix.  Lasix has been stopped.  Avoid IV fluid at this time.  Continue to monitor. Recent Labs    05/14/22 0249 05/14/22 2106 05/15/22 0745 05/16/22 0739 05/17/22 0803 05/19/22 0506 05/20/22 0430 07/07/22 1325 07/08/22 0507 07/09/22 0505  BUN '23 22 20 16 20 23 23 23 23 '$ 29*  CREATININE 1.08 1.06 1.02 0.94 1.04 1.09 1.10 1.14 1.11 0.93*   Acute metabolic encephalopathy Dementia with behavioral disturbance Anxiety/depression Per RN, patient had episodes of restlessness, agitation, pulling out lines last night.   PTA on Zyprexa 2.5 mg nightly as needed, Ativan 0.5 mg 3 times daily as needed, Prozac 40 mg daily Patient was calm this morning. Currently continued on all.  Amiodarone-induced hypERthyroidism Recently hospitalized from 7/21 to 8/7 for altered mental status due to amiodarone-induced.  Amiodarone was stopped at the time and patient was started on methimazole.  Patient's wife states that he is still taking methimazole and has not yet followed up with endocrinologist. TSH and free T4 level both seem to be improving but still not in target range. Continue methimazole. Recent Labs    07/14/21 1046 05/06/22 1012 07/07/22 1325  TSH 3.25 <0.010* 0.157*   Type 2 diabetes mellitus A1c 5.7 on 9/21 PTA on metformin 500 mg twice daily.  Not on insulin Keep metformin on hold.  Continue sliding scale insulin with Accu-Cheks. Recent Labs  Lab 07/08/22 1157 07/08/22 1645 07/08/22 2104 07/09/22 0744 07/09/22 1137  GLUCAP 99 122* 102* 101* 90   PAF On Toprol and Eliquis Continue both  Hyperlipidemia Continue statin    BPH Continue Flomax  Morbid obesity  -Body mass index is 29.95 kg/m. Patient has been advised to make an attempt to improve diet and  exercise patterns to aid in weight loss.  OSA Nightly CPAP  History of Non-Hodgkin's lymphoma.  Impaired mobility At home, patient at baseline is able to ambulate with a walker PT eval obtained.  SNF recommended  Stage II sacral decubitus ulcer Wound care consult appreciated  Goals of care   Code Status: DNR.    Diet:  Diet Order             Diet 2 gram sodium Room service appropriate? Yes; Fluid consistency: Thin  Diet effective now                  DVT prophylaxis:  apixaban (ELIQUIS) tablet 5 mg   Antimicrobials: None Fluid: None Consultants: None Family Communication: Wife at bedside  Status is: Inpatient  Continue in-hospital care because: Ongoing management for CHF, COVID, AKI Level of care: Progressive   Dispo: The patient is from: Home              Anticipated d/c is to: SNF recommended              Patient currently is not medically stable to d/c.  Difficult to place patient No     Infusions:    Scheduled Meds:  albuterol  2.5 mg Nebulization BID   apixaban  5 mg Oral BID   vitamin C  500 mg Oral Daily   atorvastatin  20 mg Oral Daily   FLUoxetine  40 mg Oral Daily   guaiFENesin  600 mg Oral BID   insulin aspart  0-5 Units Subcutaneous QHS   insulin aspart  0-9 Units Subcutaneous TID WC   melatonin  3 mg Oral QHS   methimazole  10 mg Oral TID   methylPREDNISolone (SOLU-MEDROL) injection  40 mg Intravenous Daily   metoprolol succinate  25 mg Oral Daily   molnupiravir EUA  4 capsule Oral BID   OLANZapine  2.5 mg Oral BID   pantoprazole  40 mg Oral QHS   senna  1 tablet Oral BID   tamsulosin  0.4 mg Oral Daily   Zinc Oxide   Topical BID   zinc sulfate  220 mg Oral Daily    PRN meds: acetaminophen **OR** acetaminophen, albuterol, hydrALAZINE, LORazepam, ondansetron **OR** ondansetron (ZOFRAN) IV, polyethylene glycol   Antimicrobials: Anti-infectives (From admission, onward)    Start     Dose/Rate Route Frequency Ordered Stop    07/07/22 1800  molnupiravir EUA (LAGEVRIO) capsule 800 mg        4 capsule Oral 2 times daily 07/07/22 1640 07/12/22 2159       Objective: Vitals:   07/09/22 0939 07/09/22 1311  BP: 106/68 117/74  Pulse: 65 (!) 59  Resp:  18  Temp:  98.1 F (36.7 C)  SpO2:  98%    Intake/Output Summary (Last 24 hours) at 07/09/2022 1532 Last data filed at 07/09/2022 1300 Gross per 24 hour  Intake 1000 ml  Output 4800 ml  Net -3800 ml   Filed Weights   07/07/22 1812 07/09/22 0500  Weight: 117.9 kg 108.7 kg   Weight change: -9.235 kg Body mass index is 29.95 kg/m.   Physical Exam: General exam: Pleasant, elderly Caucasian male.  Lying on bed.  Somnolent.  Opens eyes and verbal command.  Skin: No rashes, lesions or ulcers. HEENT: Atraumatic, normocephalic, no obvious bleeding Lungs: Diminished air entry in both bases, coughs on deep breathing  CVS: Regular rate and rhythm, no murmur GI/Abd soft, nondistended obesity, nontender, bowel sound present CNS: Somnolent, opens eyes and verbal command, calm.  Unable to answer orientation questions. Psychiatry: Mood appropriate Extremities: Chronic stasis changes.  Improving trace bilateral pedal edema.  No calf tenderness.   Data Review: I have personally reviewed the laboratory data and studies available.  F/u labs ordered Unresulted Labs (From admission, onward)     Start     Ordered   07/09/22 4315  Basic metabolic panel  Daily at 5am,   R      07/08/22 1440   07/09/22 0500  CBC with Differential/Platelet  Daily at 5am,   R      07/08/22 1440   07/08/22 0500  Brain natriuretic peptide  Daily at 5am,   R      07/07/22 1630            Signed, Terrilee Croak, MD [facility] 07/09/2022

## 2022-07-09 NOTE — NC FL2 (Signed)
[place] LEVEL OF CARE SCREENING TOOL     IDENTIFICATION  Patient Name: [name] Birthdate: 12-Mar-1946 Sex: male Admission Date (Current Location): 07/07/2022  [facility] and [place] Number:  Herbalist and Address:  [facility],  [place], [place]      Provider Number: [id number]  Attending Physician Name and Address:  Terrilee Croak, MD  Relative Name and Phone Number:  Nimesh, Riolo (Spouse) - [phone]    Current Level of Care: Hospital Recommended Level of Care: [place] Prior Approval Number:    Date Approved/Denied:   PASRR Number: [id number]  Discharge Plan: SNF    Current Diagnoses: Patient Active Problem List   Diagnosis Date Noted   Pressure injury of skin 07/09/2022   CHF exacerbation ([place]) 07/07/2022   Pneumonia due to COVID-19 virus 07/07/2022   Xerosis of skin 06/07/2022   Hallucinations, unspecified 06/07/2022   Hyperthyroidism 60/73/7106   Acute metabolic encephalopathy 26/94/8546   Dementia with behavioral disturbance ([place]) 05/06/2022   Stage 3a chronic kidney disease (CKD) ([place]) 05/06/2022   Elevated troponin 05/06/2022   Elevated d-dimer 05/06/2022   Positive colorectal cancer screening using Cologuard test 11/02/2021   Chronic anticoagulation 11/02/2021   Cellulitis of right lower leg 10/07/2019   Chronic edema 05/17/2019   PAF (paroxysmal atrial fibrillation) ([place]) 04/10/2019   Chronic HFrEF (heart failure with reduced ejection fraction) ([place]) 04/08/2019   Altered mental status 04/07/2019   Psoriasis of scalp 08/15/2018   Anxiety, generalized 01/02/2017   DM2 (diabetes mellitus, type 2) ([place]) 11/25/2014   NICM (nonischemic cardiomyopathy) ([place]) 09/09/2013   HLD (hyperlipidemia) 09/09/2013   OSA (obstructive sleep apnea) 27/12/5007   Acute systolic CHF (congestive heart failure), NYHA class 2 ([place]) 08/27/2013   LEG EDEMA 09/14/2009   Lymphoma, non-Hodgkin's  ([place]) 09/24/2008   VERTIGO 09/24/2008   ABDOMINAL MASS 10/26/2007   Essential hypertension 10/23/2007   NEPHROLITHIASIS 10/23/2007   HEADACHE 06/07/2007    Orientation RESPIRATION BLADDER Height & Weight     Self, Situation, Place  Normal Incontinent Weight: 239 lb 10.2 oz (108.7 kg) Height:  '6\' 3"'$  (190.5 cm)  BEHAVIORAL SYMPTOMS/MOOD NEUROLOGICAL BOWEL NUTRITION STATUS      Continent Diet (Regular)  AMBULATORY STATUS COMMUNICATION OF NEEDS Skin   Total Care Verbally Normal                       Personal Care Assistance Level of Assistance  Bathing, Feeding, Dressing Bathing Assistance: Maximum assistance (x2 people) Feeding assistance: Independent Dressing Assistance: Maximum assistance (x2 people)     Functional Limitations Info  Sight, Hearing, Speech Sight Info: Adequate Hearing Info: Adequate Speech Info: Adequate    SPECIAL CARE FACTORS FREQUENCY  PT (By licensed PT), OT (By licensed OT)     PT Frequency: x5/week OT Frequency: x5/week            Contractures Contractures Info: Not present    Additional Factors Info  Code Status, Allergies, Psychotropic Code Status Info: DNR Allergies Info: Amiodarone Psychotropic Info: Prozac, Zyprexa         Current Medications (07/09/2022):  This is the current hospital active medication list Current Facility-Administered Medications  Medication Dose Route Frequency Provider Last Rate Last Admin   acetaminophen (TYLENOL) tablet 650 mg  650 mg Oral Q6H PRN Dahal, Binaya, MD       Or   acetaminophen (TYLENOL) suppository 650 mg  650 mg Rectal Q6H PRN Terrilee Croak, MD  albuterol (PROVENTIL) (2.5 MG/3ML) 0.083% nebulizer solution 2.5 mg  2.5 mg Nebulization Q4H PRN Dahal, Binaya, MD       albuterol (PROVENTIL) (2.5 MG/3ML) 0.083% nebulizer solution 2.5 mg  2.5 mg Nebulization BID Dahal, Binaya, MD   2.5 mg at 07/09/22 0820   apixaban (ELIQUIS) tablet 5 mg  5 mg Oral BID Dahal, Marlowe Aschoff, MD   5 mg at 07/09/22  7169   ascorbic acid (VITAMIN C) tablet 500 mg  500 mg Oral Daily Dahal, Marlowe Aschoff, MD   500 mg at 07/09/22 0940   atorvastatin (LIPITOR) tablet 20 mg  20 mg Oral Daily Dahal, Marlowe Aschoff, MD   20 mg at 07/09/22 0939   FLUoxetine (PROZAC) capsule 40 mg  40 mg Oral Daily Dahal, Marlowe Aschoff, MD   40 mg at 07/09/22 0939   guaiFENesin (MUCINEX) 12 hr tablet 600 mg  600 mg Oral BID Dahal, Marlowe Aschoff, MD   600 mg at 07/09/22 0939   hydrALAZINE (APRESOLINE) injection 10 mg  10 mg Intravenous Q6H PRN Dahal, Binaya, MD       insulin aspart (novoLOG) injection 0-5 Units  0-5 Units Subcutaneous QHS Dahal, Binaya, MD       insulin aspart (novoLOG) injection 0-9 Units  0-9 Units Subcutaneous TID WC Dahal, Marlowe Aschoff, MD   1 Units at 07/08/22 1804   LORazepam (ATIVAN) tablet 0.5 mg  0.5 mg Oral Q8H PRN Dahal, Marlowe Aschoff, MD   0.5 mg at 07/08/22 0358   melatonin tablet 3 mg  3 mg Oral QHS Dahal, Marlowe Aschoff, MD   3 mg at 07/08/22 2109   methimazole (TAPAZOLE) tablet 10 mg  10 mg Oral TID Terrilee Croak, MD   10 mg at 07/09/22 0941   methylPREDNISolone sodium succinate (SOLU-MEDROL) 40 mg/mL injection 40 mg  40 mg Intravenous Daily Dahal, Marlowe Aschoff, MD   40 mg at 07/09/22 0941   metoprolol succinate (TOPROL-XL) 24 hr tablet 25 mg  25 mg Oral Daily Dahal, Marlowe Aschoff, MD   25 mg at 07/09/22 0939   molnupiravir EUA (LAGEVRIO) capsule 800 mg  4 capsule Oral BID Dahal, Marlowe Aschoff, MD   800 mg at 07/09/22 0938   OLANZapine (ZYPREXA) tablet 2.5 mg  2.5 mg Oral BID Dahal, Marlowe Aschoff, MD   2.5 mg at 07/08/22 1745   ondansetron (ZOFRAN) tablet 4 mg  4 mg Oral Q6H PRN Dahal, Marlowe Aschoff, MD       Or   ondansetron (ZOFRAN) injection 4 mg  4 mg Intravenous Q6H PRN Dahal, Binaya, MD       pantoprazole (PROTONIX) EC tablet 40 mg  40 mg Oral QHS Arlyn Dunning M, RPH   40 mg at 07/08/22 2109   polyethylene glycol (MIRALAX / GLYCOLAX) packet 17 g  17 g Oral Daily PRN Dahal, Marlowe Aschoff, MD       senna (SENOKOT) tablet 8.6 mg  1 tablet Oral BID Dahal, Marlowe Aschoff, MD   8.6 mg at 07/09/22  0940   tamsulosin (FLOMAX) capsule 0.4 mg  0.4 mg Oral Daily Dahal, Binaya, MD   0.4 mg at 07/09/22 0940   Zinc Oxide (TRIPLE PASTE) 12.8 % ointment   Topical BID Terrilee Croak, MD   Given at 07/09/22 0941   zinc sulfate capsule 220 mg  220 mg Oral Daily Dahal, Marlowe Aschoff, MD   220 mg at 07/09/22 6789     Discharge Medications: Please see discharge summary for a list of discharge medications.  Relevant Imaging Results:  Relevant Lab Results:   Additional Information SSN: [id number]  Corey Skains  Barbaraann Rondo, LCSW

## 2022-07-10 DIAGNOSIS — I5043 Acute on chronic combined systolic (congestive) and diastolic (congestive) heart failure: Secondary | ICD-10-CM | POA: Diagnosis not present

## 2022-07-10 LAB — CBC WITH DIFFERENTIAL/PLATELET
Abs Immature Granulocytes: 0.03 10*3/uL (ref 0.00–0.07)
Basophils Absolute: 0 10*3/uL (ref 0.0–0.1)
Basophils Relative: 0 %
Eosinophils Absolute: 0 10*3/uL (ref 0.0–0.5)
Eosinophils Relative: 0 %
HCT: 37.2 % — ABNORMAL LOW (ref 39.0–52.0)
Hemoglobin: 11.7 g/dL — ABNORMAL LOW (ref 13.0–17.0)
Immature Granulocytes: 1 %
Lymphocytes Relative: 13 %
Lymphs Abs: 0.8 10*3/uL (ref 0.7–4.0)
MCH: 28.1 pg (ref 26.0–34.0)
MCHC: 31.5 g/dL (ref 30.0–36.0)
MCV: 89.4 fL (ref 80.0–100.0)
Monocytes Absolute: 0.6 10*3/uL (ref 0.1–1.0)
Monocytes Relative: 10 %
Neutro Abs: 4.7 10*3/uL (ref 1.7–7.7)
Neutrophils Relative %: 76 %
Platelets: 259 10*3/uL (ref 150–400)
RBC: 4.16 MIL/uL — ABNORMAL LOW (ref 4.22–5.81)
RDW: 16 % — ABNORMAL HIGH (ref 11.5–15.5)
WBC: 6.1 10*3/uL (ref 4.0–10.5)
nRBC: 0 % (ref 0.0–0.2)

## 2022-07-10 LAB — BASIC METABOLIC PANEL
Anion gap: 9 (ref 5–15)
BUN: 28 mg/dL — ABNORMAL HIGH (ref 8–23)
CO2: 29 mmol/L (ref 22–32)
Calcium: 8.2 mg/dL — ABNORMAL LOW (ref 8.9–10.3)
Chloride: 102 mmol/L (ref 98–111)
Creatinine, Ser: 1.01 mg/dL (ref 0.61–1.24)
GFR, Estimated: >60 mL/min (ref >60)
Glucose, Bld: 93 mg/dL (ref 70–99)
Potassium: 4.2 mmol/L (ref 3.5–5.1)
Sodium: 140 mmol/L (ref 135–145)

## 2022-07-10 LAB — GLUCOSE, CAPILLARY
Glucose-Capillary: 85 mg/dL (ref 70–99)
Glucose-Capillary: 114 mg/dL — ABNORMAL HIGH (ref 70–99)
Glucose-Capillary: 117 mg/dL — ABNORMAL HIGH (ref 70–99)
Glucose-Capillary: 110 mg/dL — ABNORMAL HIGH (ref 70–99)

## 2022-07-10 LAB — BRAIN NATRIURETIC PEPTIDE: B Natriuretic Peptide: 1929.5 pg/mL — ABNORMAL HIGH (ref 0.0–100.0)

## 2022-07-10 MED ORDER — PREDNISONE 5 MG PO TABS: 10.0000 mg | ORAL_TABLET | Freq: Every day | ORAL | Status: DC

## 2022-07-10 MED ORDER — FUROSEMIDE 40 MG PO TABS
40.0000 mg | ORAL_TABLET | Freq: Every morning | ORAL | Status: DC
  Administered 2022-07-10 – 2022-07-19 (×10): 40 mg via ORAL
  Filled 2022-07-10 (×10): qty 1

## 2022-07-10 MED ORDER — OLANZAPINE 5 MG PO TABS
5.0000 mg | ORAL_TABLET | Freq: Two times a day (BID) | ORAL | Status: DC
  Administered 2022-07-10 – 2022-07-18 (×18): 5 mg via ORAL
  Filled 2022-07-10 (×19): qty 1

## 2022-07-10 MED ORDER — PREDNISONE 20 MG PO TABS
40.0000 mg | ORAL_TABLET | Freq: Every day | ORAL | Status: DC
  Administered 2022-07-10 – 2022-07-12 (×3): 40 mg via ORAL
  Filled 2022-07-10 (×3): qty 2

## 2022-07-10 MED ORDER — PREDNISONE 20 MG PO TABS: 40.0000 mg | ORAL_TABLET | Freq: Every day | ORAL | Status: DC

## 2022-07-10 NOTE — Progress Notes (Signed)
PROGRESS NOTE  [name]  DOB: 21-Oct-1945  PCP: [name], MD QVZ:[id number]  DOA: 07/07/2022  LOS: 3 days  Hospital Day: 4  Brief narrative: [name] is a 76 y.o. male with PMH significant for mild dementia, obesity, OSA, DM2, HTN, HLD, PAF on Eliquis, nonischemic cardiomyopathy, chronic systolic CHF (EF 25 to 32% in July 2023), non-Hodgkin's lymphoma. Patient presented to the ED today with complaint of progressive COVID symptoms. 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 Patient was discharged to SNF where he stayed for 2 weeks and was discharged home.  Per his wife, at home, patient was able to walk with a walker.   From Monday 9/18, patient started having fever, cough, shortness of breath, fatigue.  He tested positive for COVID test done at home on Tuesday.  Wife called her PCP on Wednesday and was prescribed molnupiravir which she received 1 dose of so far.  Patient became more lethargic and hence EMS was called today and he was brought to ED.   In the ED, patient was afebrile, heart rate in 80s, breathing in high 20s, on 4 L oxygen by nasal cannula Labs with WBC 4.8, hemoglobin 12.3, platelet 204, lactic acid normal, sodium 141, potassium 5, BUN/creatinine 23/1.14, albumin low at 3, BNP significantly elevated to 4248. COVID PCR positive Chest x-ray showed cardiomegaly with pulmonary vascular congestion, diffuse interstitial and alveolar opacities, most pronounced within the right lower lobe, likely pulmonary edema.  Patient was started on IV Lasix. Admitted to hospitalist service  Subjective: Patient was seen and examined this morning.   Propped up in bed.  Not in distress.  Not on supplemental oxygen this morning.  Cheerful.  Not agitated or restless at this time.  However, per nursing staff, last night patient was agitated, pulling out lines. Family not at bedside  Assessment/Plan: Acute  exacerbation of, systolic and diastolic CHF Nonischemic cardiomyopathy Essential hypertension Presented with shortness of breath, 1-2+ bilateral pedal edema Chest x-ray with cardiomegaly, pulm vascular congestion and diffuse interstitial and alveolar opacities BNP was significantly elevated over 4000 Echo from July 2023 showed EF of 25 to 30% with global hypokinesis, grade 2 diastolic dysfunction Diuresed with IV Lasix.  Patient has had about 5 L of output since admission  Pedal edema improved.  Weaned off oxygen.  BNP improved.  Creatinine stabilized PTA on Toprol 25 mg daily, Lasix 40 mg daily, Aldactone 25 mg daily Continue Toprol and Lasix.  Keep Aldactone on hold. Defer to cardiology as an outpatient for initiation of any ARB or Entresto. Net IO Since Admission: -4,860 mL [07/10/22 1125] Continue to monitor for daily intake output, weight, blood pressure, BNP, renal function and electrolytes. Recent Labs  Lab 07/07/22 1325 07/07/22 1351 07/08/22 0507 07/09/22 0505 07/10/22 0459  BNP  --  4,248.8* 4,448.2* 2,417.7* 1,929.5*  BUN 23  --  23 29* 28*  CREATININE 1.14  --  1.11 1.29* 1.01  K 5.0  --  4.8 4.1 4.2   COVID pneumonia Presented with worsening symptoms of COVID-19 infection COVID test: Positive at home on Tuesday, positive in ED on admission Chest imaging: Chest x-ray as above mostly with pulm edema but could be a mix of COVID-19 infiltrates as well. Treatment: Started on molnupiravir as an outpatient by PCP prior to admission.  It was resumed.  Patient was also started on IV Solu-Medrol.  Switch to oral prednisone today. Supportive care: Vitamin C, Zinc, PRN inhalers, Tylenol,  Antitussives (benzonatate/ Mucinex/Tussionex). Encouraged incentive spirometry, prone position, out of bed and early mobilization as much as possible WBC and inflammatory markers trend as below. Recent Labs  Lab 07/07/22 1325 07/07/22 1356 07/08/22 0507 07/09/22 0505 07/10/22 0459   SARSCOV2NAA  --  POSITIVE*  --   --   --   WBC 4.8  --  4.7 4.5 6.1  LATICACIDVEN 1.5  --   --   --   --   ALT 14  --   --   --   --    Acute respiratory failure with hypoxia Primary due to CHF exacerbation and COVID 19 and infection.  Also contributed by OSA. Required 4 L oxygen by nasal cannula on admission.  Gradually weaned down.  Not on supplemental oxygen this morning at rest.  AKI BUN/creatinine elevated 29/1.29 yesterday because of overdiuresis.  IV Lasix was held with significant improvement in creatinine. Recent Labs    05/14/22 2106 05/15/22 0745 05/16/22 0739 05/17/22 0803 05/19/22 0506 05/20/22 0430 07/07/22 1325 07/08/22 0507 07/09/22 0505 07/10/22 0459  BUN '22 20 16 20 23 23 23 23 '$ 29* 28*  CREATININE 1.06 1.02 0.94 1.04 1.09 1.10 1.14 1.11 1.29* 9.23   Acute metabolic encephalopathy Dementia with behavioral disturbance Anxiety/depression Per RN, patient had nightly episodes of restlessness, agitation, pulling out lines last night for last 2 nights PTA on Zyprexa 2.5 mg twice daily, Ativan 0.5 mg 3 times daily as needed, Prozac 40 mg daily Currently continued on all. I increased the dose of Zyprexa to 5 mg twice daily today.  Continue to monitor mental status change.  Amiodarone-induced hypERthyroidism Recently hospitalized from 7/21 to 8/7 for altered mental status due to amiodarone-induced.  Amiodarone was stopped at the time and patient was started on methimazole.   TSH and free T4 level both seem to be improving but still not in target range. Continue methimazole. Needs to follow-up with endocrinology as an outpatient Recent Labs    07/14/21 1046 05/06/22 1012 07/07/22 1325  TSH 3.25 <0.010* 0.157*   Type 2 diabetes mellitus A1c 5.7 on 9/21 PTA on metformin 500 mg twice daily.  Not on insulin Currently metformin is on hold.  Continue sliding scale insulin with Accu-Cheks. Recent Labs  Lab 07/09/22 0744 07/09/22 1137 07/09/22 1634 07/09/22 2159  07/10/22 0802  GLUCAP 101* 90 126* 118* 85   PAF On Toprol and Eliquis Continue both  Hyperlipidemia Continue statin  BPH Continue Flomax  Morbid obesity  -Body mass index is 29.7 kg/m. Patient has been advised to make an attempt to improve diet and exercise patterns to aid in weight loss.  OSA Nightly CPAP  History of Non-Hodgkin's lymphoma.  Impaired mobility At home, patient at baseline is able to ambulate with a walker PT eval obtained.  SNF recommended  Stage II sacral decubitus ulcer Wound care consult appreciated   Goals of care   Code Status: DNR.    Diet:  Diet Order             Diet 2 gram sodium Room service appropriate? Yes; Fluid consistency: Thin  Diet effective now                  DVT prophylaxis:  apixaban (ELIQUIS) tablet 5 mg   Antimicrobials: None Fluid: None Consultants: None Family Communication: Wife not at bedside  Status is: Inpatient  Continue in-hospital care because: Pending SNF Level of care: Progressive   Dispo: The patient is from: Home  Anticipated d/c is to: SNF recommended              Patient currently is medically stable to d/c.   Difficult to place patient No     Infusions:    Scheduled Meds:  apixaban  5 mg Oral BID   vitamin C  500 mg Oral Daily   atorvastatin  20 mg Oral Daily   FLUoxetine  40 mg Oral Daily   furosemide  40 mg Oral q AM   guaiFENesin  600 mg Oral BID   insulin aspart  0-5 Units Subcutaneous QHS   insulin aspart  0-9 Units Subcutaneous TID WC   melatonin  3 mg Oral QHS   methimazole  10 mg Oral TID   metoprolol succinate  25 mg Oral Daily   molnupiravir EUA  4 capsule Oral BID   OLANZapine  5 mg Oral BID   pantoprazole  40 mg Oral QHS   predniSONE  40 mg Oral Q breakfast   senna  1 tablet Oral BID   tamsulosin  0.4 mg Oral Daily   Zinc Oxide   Topical BID   zinc sulfate  220 mg Oral Daily    PRN meds: acetaminophen **OR** acetaminophen, albuterol,  hydrALAZINE, LORazepam, ondansetron **OR** ondansetron (ZOFRAN) IV, polyethylene glycol   Antimicrobials: Anti-infectives (From admission, onward)    Start     Dose/Rate Route Frequency Ordered Stop   07/07/22 1800  molnupiravir EUA (LAGEVRIO) capsule 800 mg        4 capsule Oral 2 times daily 07/07/22 1640 07/12/22 2159       Objective: Vitals:   07/09/22 2154 07/10/22 0434  BP: 115/64 119/70  Pulse: 63 77  Resp: 17   Temp: 98 F (36.7 C) 97.8 F (36.6 C)  SpO2: 97% 99%    Intake/Output Summary (Last 24 hours) at 07/10/2022 1125 Last data filed at 07/10/2022 0105 Gross per 24 hour  Intake 630 ml  Output 400 ml  Net 230 ml   Filed Weights   07/07/22 1812 07/09/22 0500 07/10/22 0500  Weight: 117.9 kg 108.7 kg 107.8 kg   Weight change: -0.9 kg Body mass index is 29.7 kg/m.   Physical Exam: General exam: Pleasant, elderly Caucasian male.  Lying on bed.  Not in physical distress  skin: No rashes, lesions or ulcers. HEENT: Atraumatic, normocephalic, no obvious bleeding Lungs: Diminished air entry in both bases, coughs on deep breathing  CVS: Regular rate and rhythm, no murmur GI/Abd soft, nondistended obesity, nontender, bowel sound present CNS: Alert, awake.  Cheerful.  Oriented to place only Psychiatry: Mood appropriate Extremities: Chronic stasis changes.  Improving trace bilateral pedal edema.  No calf tenderness.   Data Review: I have personally reviewed the laboratory data and studies available.  F/u labs ordered Unresulted Labs (From admission, onward)     Start     Ordered   07/09/22 7893  Basic metabolic panel  Daily at 5am,   R      07/08/22 1440   07/09/22 0500  CBC with Differential/Platelet  Daily at 5am,   R      07/08/22 1440            Signed, Terrilee Croak, MD [facility] 07/10/2022

## 2022-07-10 NOTE — Progress Notes (Signed)
Soft restraints removed. Doctor informed. Will continue to monitor patient.

## 2022-07-10 NOTE — Progress Notes (Signed)
Doctor made rounds and okay to shift IV solumedrol to tablet. He pulled out telemonitor and has episodes of confusion. PT out of restraints, took his pills with apple sauce and now he's resting on bed.

## 2022-07-10 NOTE — Progress Notes (Addendum)
Pt very confuse and agitated. Pt trying to get up in bed and have pulled his  IV and tele monitor. Given PRN Ativan and was ineffective. Pt is non redirectable. NP Neomia Glass notified and reordered restraints. Pt spouse made aware and was agreeable.Placed pt back on soft wrist restraints. Will continue to monitor pt.

## 2022-07-10 NOTE — Progress Notes (Signed)
Patient off restraint since 7pm, still pleasantly confused, tries to get up, needing frequent redirection.

## 2022-07-11 DIAGNOSIS — I5043 Acute on chronic combined systolic (congestive) and diastolic (congestive) heart failure: Secondary | ICD-10-CM | POA: Diagnosis not present

## 2022-07-11 LAB — BASIC METABOLIC PANEL
Anion gap: 8 (ref 5–15)
BUN: 24 mg/dL — ABNORMAL HIGH (ref 8–23)
CO2: 29 mmol/L (ref 22–32)
Calcium: 8.5 mg/dL — ABNORMAL LOW (ref 8.9–10.3)
Chloride: 103 mmol/L (ref 98–111)
Creatinine, Ser: 1.05 mg/dL (ref 0.61–1.24)
GFR, Estimated: >60 mL/min (ref >60)
Glucose, Bld: 91 mg/dL (ref 70–99)
Potassium: 3.7 mmol/L (ref 3.5–5.1)
Sodium: 140 mmol/L (ref 135–145)

## 2022-07-11 LAB — CBC WITH DIFFERENTIAL/PLATELET
Abs Immature Granulocytes: 0.03 10*3/uL (ref 0.00–0.07)
Basophils Absolute: 0 10*3/uL (ref 0.0–0.1)
Basophils Relative: 0 %
Eosinophils Absolute: 0 10*3/uL (ref 0.0–0.5)
Eosinophils Relative: 1 %
HCT: 41.1 % (ref 39.0–52.0)
Hemoglobin: 12.8 g/dL — ABNORMAL LOW (ref 13.0–17.0)
Immature Granulocytes: 1 %
Lymphocytes Relative: 14 %
Lymphs Abs: 0.9 10*3/uL (ref 0.7–4.0)
MCH: 27.7 pg (ref 26.0–34.0)
MCHC: 31.1 g/dL (ref 30.0–36.0)
MCV: 89 fL (ref 80.0–100.0)
Monocytes Absolute: 0.6 10*3/uL (ref 0.1–1.0)
Monocytes Relative: 9 %
Neutro Abs: 5.1 10*3/uL (ref 1.7–7.7)
Neutrophils Relative %: 75 %
Platelets: 304 10*3/uL (ref 150–400)
RBC: 4.62 MIL/uL (ref 4.22–5.81)
RDW: 15.9 % — ABNORMAL HIGH (ref 11.5–15.5)
WBC: 6.6 10*3/uL (ref 4.0–10.5)
nRBC: 0 % (ref 0.0–0.2)

## 2022-07-11 LAB — GLUCOSE, CAPILLARY
Glucose-Capillary: 78 mg/dL (ref 70–99)
Glucose-Capillary: 105 mg/dL — ABNORMAL HIGH (ref 70–99)
Glucose-Capillary: 131 mg/dL — ABNORMAL HIGH (ref 70–99)
Glucose-Capillary: 109 mg/dL — ABNORMAL HIGH (ref 70–99)

## 2022-07-11 MED ORDER — MELATONIN 5 MG PO TABS
5.0000 mg | ORAL_TABLET | Freq: Every day | ORAL | Status: DC
  Administered 2022-07-11 – 2022-07-18 (×8): 5 mg via ORAL
  Filled 2022-07-11 (×8): qty 1

## 2022-07-11 NOTE — Progress Notes (Signed)
PROGRESS NOTE  [name]  DOB: 03/06/1946  PCP: [name], MD ZOX:[id number]  DOA: 07/07/2022  LOS: 4 days  Hospital Day: 5  Brief narrative: [name] is a 76 y.o. male with PMH significant for mild dementia, obesity, OSA, DM2, HTN, HLD, PAF on Eliquis, nonischemic cardiomyopathy, chronic systolic CHF (EF 25 to 81% in July 2023), non-Hodgkin's lymphoma. Patient presented to the ED today with complaint of progressive COVID symptoms. 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 Patient was discharged to SNF where he stayed for 2 weeks and was discharged home.  Per his wife, at home, patient was able to walk with a walker.   From Monday 9/18, patient started having fever, cough, shortness of breath, fatigue.  He tested positive for COVID test done at home on Tuesday.  Wife called her PCP on Wednesday and was prescribed molnupiravir which she received 1 dose of so far.  Patient became more lethargic and hence EMS was called today and he was brought to ED.   In the ED, patient was afebrile, heart rate in 80s, breathing in high 20s, on 4 L oxygen by nasal cannula Labs with WBC 4.8, hemoglobin 12.3, platelet 204, lactic acid normal, sodium 141, potassium 5, BUN/creatinine 23/1.14, albumin low at 3, BNP significantly elevated to 4248. COVID PCR positive Chest x-ray showed cardiomegaly with pulmonary vascular congestion, diffuse interstitial and alveolar opacities, most pronounced within the right lower lobe, likely pulmonary edema.  Patient was started on IV Lasix. Admitted to hospitalist service  Subjective: Patient was seen and examined this morning.  Propped up in bed.  Not in distress.  Cheerful demented.  Notes he is in the hospital but thinks it is at [facility].  Able to tell me his date of birth, not oriented to time or person otherwise.  No family at bedside.  Had difficulty falling asleep last night and was  slightly restless per nursing staff  Assessment/Plan: Acute exacerbation of, systolic and diastolic CHF Nonischemic cardiomyopathy Essential hypertension Presented with shortness of breath, 1-2+ bilateral pedal edema Chest x-ray showed cardiomegaly, pulm vascular congestion and diffuse interstitial and alveolar opacities BNP was significantly elevated over 4000 Echo from July 2023 showed EF of 25 to 30% with global hypokinesis, grade 2 diastolic dysfunction He was adequately diuresed with IV Lasix.  Patient had about 7 L of output since admission  Pedal edema improved.  Weaned off oxygen.  BNP improved.  Creatinine stabilized PTA on Toprol 25 mg daily, Lasix 40 mg daily, Aldactone 25 mg daily Continue Toprol and Lasix.  Aldactone remains on hold Defer to cardiology as an outpatient for initiation of any ARB or Entresto. Net IO Since Admission: -6,750 mL [07/11/22 1438] Continue to monitor for daily intake output, weight, blood pressure, BNP, renal function and electrolytes. Recent Labs  Lab 07/07/22 1325 07/07/22 1351 07/08/22 0507 07/09/22 0505 07/10/22 0459 07/11/22 0429  BNP  --  4,248.8* 4,448.2* 2,417.7* 1,929.5*  --   BUN 23  --  23 29* 28* 24*  CREATININE 1.14  --  1.11 1.29* 1.01 1.05  K 5.0  --  4.8 4.1 4.2 3.7    COVID pneumonia Presented with worsening symptoms of COVID-19 infection COVID test: Positive at home on Tuesday, positive in ED on admission Chest imaging: Chest x-ray as above mostly with pulm edema but could be a mix of COVID-19 infiltrates as well. Treatment: Started on molnupiravir as an outpatient by PCP prior to  admission.  It was resumed on admission.  Patient was also started on IV Solu-Medrol and subsequently switched to oral prednisone Continue antitussives, as needed inhalers Encourage ambulation  Acute respiratory failure with hypoxia Primary due to CHF exacerbation and COVID 19 and infection.  Also contributed by OSA. Required 4 L oxygen by  nasal cannula on admission.  Gradually weaned down.  Not on supplemental oxygen this morning at rest.  AKI BUN/creatinine elevated 29/1.29 yesterday because of overdiuresis.  IV Lasix was held with significant improvement in creatinine.  Oral Lasix has been resumed.  Continue to monitor. Recent Labs    05/15/22 0745 05/16/22 0739 05/17/22 0803 05/19/22 0506 05/20/22 0430 07/07/22 1325 07/08/22 0507 07/09/22 0505 07/10/22 0459 07/11/22 0429  BUN '20 16 20 23 23 23 23 '$ 29* 28* 24*  CREATININE 1.02 0.94 1.04 1.09 1.10 1.14 1.11 1.29* 1.01 2.94    Acute metabolic encephalopathy Dementia with behavioral disturbance Anxiety/depression Per RN, patient had nightly episodes of restlessness, agitation, pulling out lines last night for last 2 nights PTA on Zyprexa 2.5 mg twice daily, Ativan 0.5 mg 3 times daily as needed, Prozac 40 mg daily Currently on doubled dose of Zyprexa at 5 mg twice daily, Prozac 40 mg daily.  Continue as needed Ativan.  I also started him on melatonin nightly.  Continue to monitor mental status change.  Amiodarone-induced hypERthyroidism Recently hospitalized from 7/21 to 8/7 for altered mental status due to amiodarone-induced.  Amiodarone was stopped at the time and patient was started on methimazole.   TSH and free T4 level both seem to be improving but still not in target range. Continue methimazole. Needs to follow-up with endocrinology as an outpatient Recent Labs    07/14/21 1046 05/06/22 1012 07/07/22 1325  TSH 3.25 <0.010* 0.157*    Type 2 diabetes mellitus A1c 5.7 on 9/21 PTA on metformin 500 mg twice daily.  Not on insulin Currently metformin is on hold.  Continue sliding scale insulin with Accu-Cheks. Recent Labs  Lab 07/10/22 1134 07/10/22 1636 07/10/22 2141 07/11/22 0740 07/11/22 1138  GLUCAP 114* 117* 110* 78 105*    PAF On Toprol and Eliquis Continue both  Hyperlipidemia Continue statin  BPH Continue Flomax  Morbid obesity   -Body mass index is 29.84 kg/m. Patient has been advised to make an attempt to improve diet and exercise patterns to aid in weight loss.  OSA Nightly CPAP  History of Non-Hodgkin's lymphoma.  Impaired mobility At home, patient at baseline is able to ambulate with a walker PT eval obtained.  SNF recommended  Stage II sacral decubitus ulcer Wound care consult appreciated   Goals of care   Code Status: DNR.    Diet:  Diet Order             Diet 2 gram sodium Room service appropriate? Yes; Fluid consistency: Thin  Diet effective now                  DVT prophylaxis:  apixaban (ELIQUIS) tablet 5 mg   Antimicrobials: None Fluid: None Consultants: None Family Communication: Wife not at bedside  Status is: Inpatient  Continue in-hospital care because: Pending SNF Level of care: Progressive   Dispo: The patient is from: Home              Anticipated d/c is to: SNF recommended              Patient currently is medically stable to d/c.   Difficult to place patient  No     Infusions:    Scheduled Meds:  apixaban  5 mg Oral BID   vitamin C  500 mg Oral Daily   atorvastatin  20 mg Oral Daily   FLUoxetine  40 mg Oral Daily   furosemide  40 mg Oral q AM   guaiFENesin  600 mg Oral BID   insulin aspart  0-5 Units Subcutaneous QHS   insulin aspart  0-9 Units Subcutaneous TID WC   melatonin  5 mg Oral QHS   methimazole  10 mg Oral TID   metoprolol succinate  25 mg Oral Daily   molnupiravir EUA  4 capsule Oral BID   OLANZapine  5 mg Oral BID   pantoprazole  40 mg Oral QHS   predniSONE  40 mg Oral Q breakfast   senna  1 tablet Oral BID   tamsulosin  0.4 mg Oral Daily   Zinc Oxide   Topical BID   zinc sulfate  220 mg Oral Daily    PRN meds: acetaminophen **OR** acetaminophen, albuterol, hydrALAZINE, LORazepam, ondansetron **OR** ondansetron (ZOFRAN) IV, polyethylene glycol   Antimicrobials: Anti-infectives (From admission, onward)    Start     Dose/Rate  Route Frequency Ordered Stop   07/07/22 1800  molnupiravir EUA (LAGEVRIO) capsule 800 mg        4 capsule Oral 2 times daily 07/07/22 1640 07/12/22 2159       Objective: Vitals:   07/11/22 0700 07/11/22 1144  BP: (!) 138/98 122/72  Pulse: 69 82  Resp: (!) 21 18  Temp: 97.6 F (36.4 C) 97.6 F (36.4 C)  SpO2: 97% 100%    Intake/Output Summary (Last 24 hours) at 07/11/2022 1438 Last data filed at 07/11/2022 0845 Gross per 24 hour  Intake 360 ml  Output 1250 ml  Net -890 ml    Filed Weights   07/09/22 0500 07/10/22 0500 07/11/22 0500  Weight: 108.7 kg 107.8 kg 108.3 kg   Weight change: 0.5 kg Body mass index is 29.84 kg/m.   Physical Exam: General exam: Pleasant, elderly Caucasian male.  Lying on bed.  Not on physical distress skin: No rashes, lesions or ulcers. HEENT: Atraumatic, normocephalic, no obvious bleeding Lungs: Diminished air entry in both bases, coughs on deep breathing  CVS: Regular rate and rhythm, no murmur GI/Abd soft, nondistended obesity, nontender, bowel sound present CNS: Alert, awake.  Cheerful.  Oriented to place only Psychiatry: Mood appropriate Extremities: Chronic stasis changes.  Improving trace bilateral pedal edema.  No calf tenderness.   Data Review: I have personally reviewed the laboratory data and studies available.  F/u labs ordered Unresulted Labs (From admission, onward)    None       Signed, Terrilee Croak, MD [facility] 07/11/2022

## 2022-07-11 NOTE — TOC Progression Note (Addendum)
Transition of Care [facility]) - Progression Note    Patient Details  Name: [name] MRN: [id number] Date of Birth: 1946-04-14  Transition of Care [facility]) CM/SW Contact  Jnaya Butrick, Juliann Pulse, RN Phone Number: 07/11/2022, 11:47 AM  Clinical Narrative: spoke to spouse Kaye-bed offers given await choice.  -3p Kaye(spouse) chose [facility] rep Lorenza Chick states must have sitter d/c 24hrs prior d/c,also need 10days isolation for Covid+ 0n 9/22-can d/c on 10/3.Noted per MD recc PT or mobility spec to see daily.MD updated.   1. 1.5 mi [facility] [place], [place] [address] [phone] Overall rating Much below average 2. 1.9 mi [facility] [place] [address] [place], [place] [address] [phone] Overall rating Above average 3. 2 mi [facility] [address] [place], [place] [address] [phone] Overall rating Much below average 4. 2.4 mi [facility] [place], [place] [address] [phone] Overall rating Above average 5. 2.6 mi [facility] [place], [place] [address] [phone] Overall rating Much below average 6. 3.4 mi [place], [place] [address] [phone] Overall rating Much below average 7. 3.7 mi [facility] [place], [place] [address] [phone] Overall rating Much above average 8. 3.9 mi [facility] [place], [place] [address] [phone] Overall rating Above average 9. [place] [facility] [address] [place], [place] [address] [phone] Overall rating Average 10. 4.2 mi [place] [address] [place], [place] [address] [phone] Overall rating Much below average 11. 4.6 mi [facility] [address] [place], [place] [address] [phone] Overall rating Much below average 12. 6.3 mi [facility] [address] [place], [place] [address] [phone] Overall rating Above average 13. 9.2 mi [facility] and [place], [place] [address] [phone] Overall rating Above average 14. 9.6 [place], [place] [address] [phone] Overall rating Much above average 15. 9.8 mi [facility] [address] [place], [place] [address] [phone] Overall rating Above average 16. 9.9 mi [facility] [address] [place], [place] [address] [phone] Overall rating Much above average 17. 10.7 mi [place] [facility] [address] [place], [place] [address] [phone] Overall rating Much above average 18. 13.3 [facility] [address] [place], [place] [address] [phone] Overall rating Much below average 19. 13.6 mi [facility] [address] [place], [place] [address] [phone] Overall rating Much below average 20. 14.2 mi [facility] and [facility] [place], [place] [address] [phone] Overall rating Much above average 21. 14.4 [place] [address] [place], [place] [address] [phone] Overall rating Much below average 22. 15 mi [facility] at [place], [place] [address] [phone] Overall rating Much above average 23. 15.2 mi [place] [address] [place], [place] [address] [phone] Overall rating Average 24. 15.3 mi [facility] [address] [place],  [address] [phone] Overall rating Average  25. 16.9 mi [facility], [place] [address] [phone] Overall rating Above average 26. 18.1 mi [place], [place] [address] [phone] Overall rating Above average 27. 18.9 mi [facility] [address] [place], [place] [address] [phone] Overall rating Much below average 28. 19.7 mi [facility] [place], [place] [address] [phone] Overall rating Below average 29. 20.2 mi [address] at the [facility] at [facility], [place] [address] [phone] Overall rating Much above average 30. 20.4 mi [facility] and [facility] [address] [place], [place] [address] [phone] Overall rating Much below average 31. 20.4 mi [facility] [address] [place], [place] [address] [phone] Overall rating Much below average 32. 20.6 [place], [place] [address] [phone] Overall rating Much above average 33. 21.2 [address] [place], [place] [address] [phone] Overall rating Below average 34. 22.7 [place] [address] [place], [place] [address] [phone] Overall rating Below average 35. 22.8 mi [facility] [address] [place], [place] [address] [phone] Overall rating Much above average 36. 23.1 mi [place] [address] [place], [place] [address] [phone] Overall rating Average 37. 23.5 mi [facility] [address] [place], [place] [address] [phone] Overall rating Above average 38. 23.7 [place], [place] [address] [phone] Overall rating Not available18 39. [place], [place] [address] [phone] Overall rating Much below average 40. 24.8 mi [place] [address] [place],  [address] [phone] Overall rating Much above average To explore and download nursing hom  Expected Discharge Plan: [place] Barriers to Discharge: Continued Medical Work up  Expected Discharge Plan and Services Expected Discharge Plan: [place]                                               Social Determinants of Health (SDOH) Interventions    Readmission Risk Interventions    05/23/2022   12:29 PM 05/12/2022    2:43 PM  Readmission Risk Prevention Plan  Transportation Screening Complete Complete  PCP or Specialist Appt within 3-5 Days Complete   HRI or Home Care Consult Complete Complete  Social Work Consult for [place] Planning/Counseling Complete Complete  Palliative Care Screening Not Applicable Not Applicable  Medication Review Press photographer) Complete

## 2022-07-11 NOTE — Progress Notes (Signed)
Physical Therapy Treatment Patient Details Name: [name] MRN: [id number] DOB: Aug 20, 1946 Today's Date: 07/11/2022   History of Present Illness [name] is a 76 y.o. male admitted with CHF exacerbation. Pt positive with covid19. PMH: CHF, diabetes, dementia    PT Comments    Pt awake/alert this morning and able to assist with mobility.  Pt transferred from bed to recliner. SpO2 95% on room air after transfer.  Continue to recommend SNF upon d/c.   Recommendations for follow up therapy are one component of a multi-disciplinary discharge planning process, led by the attending physician.  Recommendations may be updated based on patient status, additional functional criteria and insurance authorization.  Follow Up Recommendations  Skilled nursing-short term rehab (<3 hours/day) Can patient physically be transported by private vehicle: No   Assistance Recommended at Discharge Frequent or constant Supervision/Assistance  Patient can return home with the following Assistance with feeding;Assist for transportation;Help with stairs or ramp for entrance;Direct supervision/assist for medications management;Assistance with cooking/housework;Direct supervision/assist for financial management;A little help with walking and/or transfers;A little help with bathing/dressing/bathroom   Equipment Recommendations  Rolling walker (2 wheels)    Recommendations for Other Services       Precautions / Restrictions Precautions Precautions: Fall     Mobility  Bed Mobility Overal bed mobility: Needs Assistance Bed Mobility: Supine to Sit     Supine to sit: Min assist     General bed mobility comments: multimodal cues for technique    Transfers Overall transfer level: Needs assistance Equipment used: Rolling walker (2 wheels) Transfers: Sit to/from Stand, Bed to chair/wheelchair/BSC Sit to Stand: Min assist, From elevated surface   Step pivot transfers: Min assist, +2  safety/equipment       General transfer comment: light assist to rise and steady, cues for hand placement, pt able to march in place for approx 20 seconds    Ambulation/Gait                   Stairs             Wheelchair Mobility    Modified Rankin (Stroke Patients Only)       Balance                                            Cognition Arousal/Alertness: Awake/alert Behavior During Therapy: WFL for tasks assessed/performed Overall Cognitive Status: No family/caregiver present to determine baseline cognitive functioning                                 General Comments: appears pleasantly confused        Exercises      General Comments        Pertinent Vitals/Pain Pain Assessment Pain Assessment: No/denies pain    Home Living                          Prior Function            PT Goals (current goals can now be found in the care plan section) Progress towards PT goals: Progressing toward goals    Frequency    Min 2X/week      PT Plan Current plan remains appropriate    Co-evaluation  AM-PAC PT "6 Clicks" Mobility   Outcome Measure  Help needed turning from your back to your side while in a flat bed without using bedrails?: A Lot Help needed moving from lying on your back to sitting on the side of a flat bed without using bedrails?: A Lot Help needed moving to and from a bed to a chair (including a wheelchair)?: A Lot Help needed standing up from a chair using your arms (e.g., wheelchair or bedside chair)?: A Lot Help needed to walk in hospital room?: Total Help needed climbing 3-5 steps with a railing? : Total 6 Click Score: 10    End of Session Equipment Utilized During Treatment: Gait belt Activity Tolerance: Patient tolerated treatment well Patient left: in chair;with call bell/phone within reach;with chair alarm set   PT Visit Diagnosis: Other abnormalities of  gait and mobility (R26.89);Muscle weakness (generalized) (M62.81)     Time: 1023-1040 PT Time Calculation (min) (ACUTE ONLY): 17 min  Charges:  $Therapeutic Activity: 8-22 mins                    Jannette Spanner PT, DPT Physical Therapist Acute Rehabilitation Services Preferred contact method: Secure Chat Weekend Pager Only: [phone] Office: [place] 07/11/2022, 1:53 PM

## 2022-07-12 DIAGNOSIS — I5043 Acute on chronic combined systolic (congestive) and diastolic (congestive) heart failure: Secondary | ICD-10-CM | POA: Diagnosis not present

## 2022-07-12 LAB — GLUCOSE, CAPILLARY
Glucose-Capillary: 77 mg/dL (ref 70–99)
Glucose-Capillary: 106 mg/dL — ABNORMAL HIGH (ref 70–99)
Glucose-Capillary: 174 mg/dL — ABNORMAL HIGH (ref 70–99)
Glucose-Capillary: 106 mg/dL — ABNORMAL HIGH (ref 70–99)

## 2022-07-12 LAB — CULTURE, BLOOD (ROUTINE X 2)
Culture: NO GROWTH
Culture: NO GROWTH
Special Requests: ADEQUATE

## 2022-07-12 MED ORDER — PREDNISONE 20 MG PO TABS
20.0000 mg | ORAL_TABLET | Freq: Every day | ORAL | Status: DC
  Administered 2022-07-13 – 2022-07-18 (×6): 20 mg via ORAL
  Filled 2022-07-12 (×7): qty 1

## 2022-07-12 NOTE — Progress Notes (Signed)
PROGRESS NOTE  [name]  DOB: [birth year]  PCP: [name], MD CWC:[id number]  DOA: 07/07/2022  LOS: 5 days  Hospital Day: 6  Brief narrative: [name] is a 77 y.o. male with PMH significant for mild dementia, obesity, OSA, DM2, HTN, HLD, PAF on Eliquis, nonischemic cardiomyopathy, chronic systolic CHF (EF 25 to 76% in July 2023), non-Hodgkin's lymphoma. Patient presented to the ED with complaint of progressive COVID symptoms. 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 Patient was discharged to SNF where he stayed for 2 weeks and was discharged home.  Per his wife, at home, patient was able to walk with a walker.   From 9/18, patient started having fever, cough, shortness of breath, fatigue.  He tested positive for COVID test done at home on 9/19.  Wife called her PCP on 9/20 and was prescribed molnupiravir which she received 1 dose of so far.  Patient became more lethargic and hence EMS was called and he was brought to ED on 9/21  In the ED, patient was afebrile, and hemodynamically stable but required 4 L oxygen by nasal cannula.   BNP was significantly elevated to 4248. COVID PCR positive Chest x-ray showed pulm edema.  Given IV Lasix Admitted to hospitalist service for CHF exacerbation and COVID infection See below for details  Subjective: Patient was seen and examined this morning.  Propped up in bed.  Not in distress.   Cheerfully demented.  Oriented to place only.  Per nursing staff, last night he was calmer compared to previous nights.  Assessment/Plan: Acute exacerbation of, systolic and diastolic CHF Nonischemic cardiomyopathy Essential hypertension Presented with shortness of breath, 1-2+ bilateral pedal edema Chest x-ray showed cardiomegaly, pulm vascular congestion and diffuse interstitial and alveolar opacities BNP was significantly elevated over 4000 Echo from July 2023 showed EF of 25  to 30% with global hypokinesis, grade 2 diastolic dysfunction He was adequately diuresed with IV Lasix.  Patient had about 8 L of output since admission  Pedal edema improved.  Weaned off oxygen.  BNP improved.  Creatinine stabilized PTA on Toprol 25 mg daily, Lasix 40 mg daily, Aldactone 25 mg daily Continue Toprol and Lasix.  Aldactone remains on hold.  Blood pressure rises up, can resume. Defer to cardiology as an outpatient for initiation of any ARB or Entresto. Net IO Since Admission: -7,940 mL [07/12/22 1203] Continue to monitor for daily intake output, weight, blood pressure, BNP, renal function and electrolytes. Recent Labs  Lab 07/07/22 1325 07/07/22 1351 07/08/22 0507 07/09/22 0505 07/10/22 0459 07/11/22 0429  BNP  --  4,248.8* 4,448.2* 2,417.7* 1,929.5*  --   BUN 23  --  23 29* 28* 24*  CREATININE 1.14  --  1.11 1.29* 1.01 1.05  K 5.0  --  4.8 4.1 4.2 3.7   COVID pneumonia Presented with worsening symptoms of COVID-19 infection COVID test: Positive at home on 9/19, positive in ED on admission on 9/21 Initial chest x-ray on admission showed pulmonary edema but it could be a mix of COVID-19 infiltrates as well. Cough for the course of molnupiravir.  Initially IV Solu-Medrol was started as well which has now been switched to oral prednisone.  Plan for 10-day course to complete on 9/30. Continue antitussives, as needed inhalers Encourage ambulation  Acute respiratory failure with hypoxia Primary due to CHF exacerbation and COVID 19 and infection.  Also contributed by OSA. Required 4 L oxygen by nasal cannula on admission.  Gradually weaned down.  Not on supplemental oxygen this morning at rest.  AKI BUN/creatinine elevated 29/1.29 yesterday because of overdiuresis.  IV Lasix was held with significant improvement in creatinine.  Oral Lasix has been resumed.  Continue to monitor. Recent Labs    05/15/22 0745 05/16/22 0739 05/17/22 0803 05/19/22 0506 05/20/22 0430  07/07/22 1325 07/08/22 0507 07/09/22 0505 07/10/22 0459 07/11/22 0429  BUN '20 16 20 23 23 23 23 '$ 29* 28* 24*  CREATININE 1.02 0.94 1.04 1.09 1.10 1.14 1.11 1.29* 1.01 9.38   Acute metabolic encephalopathy Dementia with behavioral disturbance Anxiety/depression PTA on Zyprexa 2.5 mg twice daily, Ativan 0.5 mg 3 times daily as needed, Prozac 40 mg daily Despite those medicines, patient was having nightly episodes of restlessness. 9/25, Zyprexa dose was doubled to 5 mg twice daily.  Melatonin nightly was added.  Other medicines were continued.  He slept better last night.  He mostly remains oriented to place only.  Mental status during the day remains controlled.  Amiodarone-induced hypERthyroidism Recently hospitalized from 7/21 to 8/7 for altered mental status due to amiodarone-induced.  Amiodarone was stopped at the time and patient was started on methimazole.   TSH and free T4 level both seem to be improving but still not in target range. Continue methimazole. Needs to follow-up with endocrinology as an outpatient Recent Labs    07/14/21 1046 05/06/22 1012 07/07/22 1325  TSH 3.25 <0.010* 0.157*   Type 2 diabetes mellitus A1c 5.7 on 9/21 PTA on metformin 500 mg twice daily.  Not on insulin Currently metformin is on hold.  If renal function remains stable, can resume at discharge.  Continue sliding scale insulin with Accu-Cheks. Recent Labs  Lab 07/11/22 1138 07/11/22 1640 07/11/22 2139 07/12/22 0740 07/12/22 1134  GLUCAP 105* 131* 109* 77 106*   PAF On Toprol and Eliquis Continue both  Hyperlipidemia Continue statin  BPH Continue Flomax  Morbid obesity  -Body mass index is 29.66 kg/m. Patient has been advised to make an attempt to improve diet and exercise patterns to aid in weight loss.  OSA Nightly CPAP  History of Non-Hodgkin's lymphoma.  Impaired mobility At home, patient at baseline is able to ambulate with a walker PT eval obtained.  SNF recommended.   However unable to discharge to SNF until 10/3 because of 10-day COVID isolation policy.  I have advised PT and mobility specialist to work with him every day.  Patient may actually improve sooner and end up going home directly from the hospital.  Stage II sacral decubitus ulcer Wound care consult appreciated   Goals of care   Code Status: DNR.    Diet:  Diet Order             Diet 2 gram sodium Room service appropriate? Yes; Fluid consistency: Thin  Diet effective now                  DVT prophylaxis:  apixaban (ELIQUIS) tablet 5 mg   Antimicrobials: None Fluid: None Consultants: None Family Communication: Wife not at bedside  Status is: Inpatient  Continue in-hospital care because: Pending SNF Level of care: Progressive   Dispo: The patient is from: Home              Anticipated d/c is to: SNF recommended              Patient currently is medically stable to d/c.   Difficult to place patient No     Infusions:  Scheduled Meds:  apixaban  5 mg Oral BID   vitamin C  500 mg Oral Daily   atorvastatin  20 mg Oral Daily   FLUoxetine  40 mg Oral Daily   furosemide  40 mg Oral q AM   guaiFENesin  600 mg Oral BID   insulin aspart  0-5 Units Subcutaneous QHS   insulin aspart  0-9 Units Subcutaneous TID WC   melatonin  5 mg Oral QHS   methimazole  10 mg Oral TID   metoprolol succinate  25 mg Oral Daily   OLANZapine  5 mg Oral BID   pantoprazole  40 mg Oral QHS   [START ON 07/13/2022] predniSONE  20 mg Oral Q breakfast   senna  1 tablet Oral BID   tamsulosin  0.4 mg Oral Daily   Zinc Oxide   Topical BID   zinc sulfate  220 mg Oral Daily    PRN meds: acetaminophen **OR** acetaminophen, albuterol, hydrALAZINE, LORazepam, ondansetron **OR** ondansetron (ZOFRAN) IV, polyethylene glycol   Antimicrobials: Anti-infectives (From admission, onward)    Start     Dose/Rate Route Frequency Ordered Stop   07/07/22 1800  molnupiravir EUA (LAGEVRIO) capsule 800 mg         4 capsule Oral 2 times daily 07/07/22 1640 07/12/22 0927       Objective: Vitals:   07/11/22 2126 07/12/22 0559  BP: 129/75 135/80  Pulse: 60 70  Resp: 18 20  Temp: 97.7 F (36.5 C) 97.6 F (36.4 C)  SpO2: 98% 100%    Intake/Output Summary (Last 24 hours) at 07/12/2022 1203 Last data filed at 07/12/2022 0224 Gross per 24 hour  Intake 360 ml  Output 350 ml  Net 10 ml   Filed Weights   07/10/22 0500 07/11/22 0500 07/12/22 0500  Weight: 107.8 kg 108.3 kg 107.6 kg   Weight change: -0.65 kg Body mass index is 29.66 kg/m.   Physical Exam: General exam: Pleasant, elderly Caucasian male.  Lying on bed.  Not on physical distress skin: No rashes, lesions or ulcers. HEENT: Atraumatic, normocephalic, no obvious bleeding Lungs: Diminished air entry in both bases, coughs on deep breathing  CVS: Regular rate and rhythm, no murmur GI/Abd soft, nondistended obesity, nontender, bowel sound present CNS: Alert, awake.  Cheerful.  Oriented to place only Psychiatry: Mood appropriate Extremities: Chronic stasis changes.  Improving trace bilateral pedal edema.  No calf tenderness.   Data Review: I have personally reviewed the laboratory data and studies available.  F/u labs ordered Unresulted Labs (From admission, onward)     Start     Ordered   07/13/22 0500  CBC with Differential/Platelet  Tomorrow morning,   R       Question:  Specimen collection method  Answer:  Lab=Lab collect   07/12/22 1203   07/13/22 1027  Basic metabolic panel  Tomorrow morning,   R       Question:  Specimen collection method  Answer:  Lab=Lab collect   07/12/22 1203            Signed, Terrilee Croak, MD [facility] 07/12/2022

## 2022-07-12 NOTE — Progress Notes (Signed)
Mobility Specialist Cancellation/Refusal Note:   Reason for Cancellation/Refusal: Pt declined mobility at this time. Pt had no specific reason for declining mobility.  Will check back as schedule permits.    Ferd Hibbs Mobility Specialist

## 2022-07-13 DIAGNOSIS — I5043 Acute on chronic combined systolic (congestive) and diastolic (congestive) heart failure: Secondary | ICD-10-CM | POA: Diagnosis not present

## 2022-07-13 LAB — GLUCOSE, CAPILLARY
Glucose-Capillary: 95 mg/dL (ref 70–99)
Glucose-Capillary: 196 mg/dL — ABNORMAL HIGH (ref 70–99)
Glucose-Capillary: 133 mg/dL — ABNORMAL HIGH (ref 70–99)
Glucose-Capillary: 91 mg/dL (ref 70–99)

## 2022-07-13 LAB — CBC WITH DIFFERENTIAL/PLATELET
Abs Immature Granulocytes: 0.03 10*3/uL (ref 0.00–0.07)
Basophils Absolute: 0 10*3/uL (ref 0.0–0.1)
Basophils Relative: 0 %
Eosinophils Absolute: 0 10*3/uL (ref 0.0–0.5)
Eosinophils Relative: 1 %
HCT: 41.5 % (ref 39.0–52.0)
Hemoglobin: 13.2 g/dL (ref 13.0–17.0)
Immature Granulocytes: 1 %
Lymphocytes Relative: 11 %
Lymphs Abs: 0.7 10*3/uL (ref 0.7–4.0)
MCH: 27.9 pg (ref 26.0–34.0)
MCHC: 31.8 g/dL (ref 30.0–36.0)
MCV: 87.7 fL (ref 80.0–100.0)
Monocytes Absolute: 0.6 10*3/uL (ref 0.1–1.0)
Monocytes Relative: 9 %
Neutro Abs: 5 10*3/uL (ref 1.7–7.7)
Neutrophils Relative %: 78 %
Platelets: 338 10*3/uL (ref 150–400)
RBC: 4.73 MIL/uL (ref 4.22–5.81)
RDW: 15.6 % — ABNORMAL HIGH (ref 11.5–15.5)
WBC: 6.4 10*3/uL (ref 4.0–10.5)
nRBC: 0 % (ref 0.0–0.2)

## 2022-07-13 LAB — BASIC METABOLIC PANEL
Anion gap: 8 (ref 5–15)
BUN: 26 mg/dL — ABNORMAL HIGH (ref 8–23)
CO2: 29 mmol/L (ref 22–32)
Calcium: 8.5 mg/dL — ABNORMAL LOW (ref 8.9–10.3)
Chloride: 103 mmol/L (ref 98–111)
Creatinine, Ser: 1.07 mg/dL (ref 0.61–1.24)
GFR, Estimated: >60 mL/min (ref >60)
Glucose, Bld: 94 mg/dL (ref 70–99)
Potassium: 3.7 mmol/L (ref 3.5–5.1)
Sodium: 140 mmol/L (ref 135–145)

## 2022-07-13 NOTE — Progress Notes (Signed)
PROGRESS NOTE  [name]  DOB: 1946-06-17  PCP: [name], MD WNI:[id number]  DOA: 07/07/2022  LOS: 6 days  Hospital Day: 7  Brief narrative: [name] is a 76 y.o. male with PMH significant for mild dementia, obesity, OSA, DM2, HTN, HLD, PAF on Eliquis, nonischemic cardiomyopathy, chronic systolic CHF (EF 25 to 38% in July 2023), non-Hodgkin's lymphoma. Patient presented to the ED with complaint of progressive COVID symptoms. 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 Patient was discharged to SNF where he stayed for 2 weeks and was discharged home.  Per his wife, at home, patient was able to walk with a walker.   From 9/18, patient started having fever, cough, shortness of breath, fatigue.  He tested positive for COVID test done at home on 9/19.  Wife called her PCP on 9/20 and was prescribed molnupiravir which she received 1 dose of so far.  Patient became more lethargic and hence EMS was called and he was brought to ED on 9/21  In the ED, patient was afebrile, and hemodynamically stable but required 4 L oxygen by nasal cannula.   BNP was significantly elevated to 4248. COVID PCR positive Chest x-ray showed pulm edema.  Given IV Lasix Admitted to hospitalist service for CHF exacerbation and COVID infection Clinically improved. Currently pending placement, needs to wait till the end isolation per COVID is over on 10/3.  Subjective: Patient was seen and examined this morning.  Propped up in bed.  Alert, awake, taking his breakfast.  Oriented to place only. Uneventful last night  Assessment/Plan: Acute exacerbation of, systolic and diastolic CHF Nonischemic cardiomyopathy Essential hypertension Presented with shortness of breath, 1-2+ bilateral pedal edema Chest x-ray showed cardiomegaly, pulm vascular congestion and diffuse interstitial and alveolar opacities BNP was significantly elevated over  4000 Echo from July 2023 showed EF of 25 to 30% with global hypokinesis, grade 2 diastolic dysfunction He was adequately diuresed with IV Lasix.  Patient had about 8 L of output since admission  Pedal edema improved.  Weaned off oxygen.  BNP improved.  Creatinine stabilized PTA on Toprol 25 mg daily, Lasix 40 mg daily, Aldactone 25 mg daily Continue Toprol and Lasix.  Aldactone remains on hold.  If blood pressure rises up, can resume. Defer to cardiology as an outpatient for initiation of any ARB or Entresto. Net IO Since Admission: -8,080 mL [07/13/22 1419] Continue to monitor for daily intake output, weight, blood pressure, BNP, renal function and electrolytes. Recent Labs  Lab 07/07/22 1351 07/08/22 0507 07/09/22 0505 07/10/22 0459 07/11/22 0429 07/13/22 0500  BNP 4,248.8* 4,448.2* 2,417.7* 1,929.5*  --   --   BUN  --  23 29* 28* 24* 26*  CREATININE  --  1.11 1.29* 1.01 1.05 1.07  K  --  4.8 4.1 4.2 3.7 3.7    COVID pneumonia Presented with worsening symptoms of COVID-19 infection COVID test: Positive at home on 9/19, positive in ED on admission on 9/21 Initial chest x-ray on admission showed pulmonary edema but it could be a mix of COVID-19 infiltrates as well. Completed the course of molnupiravir.  Initially IV Solu-Medrol was started as well which has now been switched to oral prednisone.  Plan for 10-day course to complete on 9/30. Continue antitussives, as needed inhalers Encourage ambulation  Acute respiratory failure with hypoxia Primarily due to CHF exacerbation and COVID 19 and infection.  Also contributed by OSA. Required 4 L oxygen by nasal  cannula on admission.  Gradually weaned down.  Not on supplemental oxygen this morning at rest.  AKI BUN/creatinine elevated 29/1.29 yesterday because of overdiuresis.  IV Lasix was held with significant improvement in creatinine.  Oral Lasix has been resumed.  Continue to monitor. Recent Labs    05/16/22 0739 05/17/22 0803  05/19/22 0506 05/20/22 0430 07/07/22 1325 07/08/22 0507 07/09/22 0505 07/10/22 0459 07/11/22 0429 07/13/22 0500  BUN '16 20 23 23 23 23 '$ 29* 28* 24* 26*  CREATININE 0.94 1.04 1.09 1.10 1.14 1.11 1.29* 1.01 1.05 2.67    Acute metabolic encephalopathy Dementia with behavioral disturbance Anxiety/depression PTA on Zyprexa 2.5 mg twice daily, Ativan 0.5 mg 3 times daily as needed, Prozac 40 mg daily Despite those medicines, patient was having nightly episodes of restlessness. 9/25, Zyprexa dose was doubled to 5 mg twice daily.  Melatonin nightly was added.  Other medicines were continued.  His mental status and nightly sleepiness have improved gradually.   Amiodarone-induced hypERthyroidism Recently hospitalized from 7/21 to 8/7 for altered mental status due to amiodarone-induced.  Amiodarone was stopped at the time and patient was started on methimazole.   TSH and free T4 level both seem to be improving but still not in target range. Continue methimazole. Needs to follow-up with endocrinology as an outpatient Recent Labs    07/14/21 1046 05/06/22 1012 07/07/22 1325  TSH 3.25 <0.010* 0.157*    Type 2 diabetes mellitus A1c 5.7 on 9/21 PTA on metformin 500 mg twice daily.  Not on insulin Currently metformin is on hold.  If renal function remains stable, can resume at discharge.  Continue sliding scale insulin with Accu-Cheks. Recent Labs  Lab 07/12/22 1134 07/12/22 1809 07/12/22 2110 07/13/22 0738 07/13/22 1147  GLUCAP 106* 174* 106* 95 196*    PAF On Toprol and Eliquis Continue both  Hyperlipidemia Continue statin  BPH Continue Flomax  Morbid obesity  -Body mass index is 29.18 kg/m. Patient has been advised to make an attempt to improve diet and exercise patterns to aid in weight loss.  OSA Nightly CPAP  History of Non-Hodgkin's lymphoma.  Impaired mobility At home, patient at baseline is able to ambulate with a walker PT eval obtained.  SNF recommended.   However unable to discharge to SNF until 10/3 because of 10-day COVID isolation policy.  I have advised PT and mobility specialist to work with him every day.  Patient may actually improve sooner and end up going home directly from the hospital.   Stage II sacral decubitus ulcer Wound care consult appreciated   Goals of care   Code Status: DNR.    Diet:  Diet Order             Diet 2 gram sodium Room service appropriate? Yes; Fluid consistency: Thin  Diet effective now                  DVT prophylaxis:  apixaban (ELIQUIS) tablet 5 mg   Antimicrobials: None Fluid: None Consultants: None Family Communication: Wife not at bedside  Status is: Inpatient  Continue in-hospital care because: Pending SNF Level of care: Progressive   Dispo: The patient is from: Home              Anticipated d/c is to: SNF recommended              Patient currently is medically stable to d/c.   Difficult to place patient No     Infusions:    Scheduled Meds:  apixaban  5 mg Oral BID   vitamin C  500 mg Oral Daily   atorvastatin  20 mg Oral Daily   FLUoxetine  40 mg Oral Daily   furosemide  40 mg Oral q AM   guaiFENesin  600 mg Oral BID   insulin aspart  0-5 Units Subcutaneous QHS   insulin aspart  0-9 Units Subcutaneous TID WC   melatonin  5 mg Oral QHS   methimazole  10 mg Oral TID   metoprolol succinate  25 mg Oral Daily   OLANZapine  5 mg Oral BID   pantoprazole  40 mg Oral QHS   predniSONE  20 mg Oral Q breakfast   senna  1 tablet Oral BID   tamsulosin  0.4 mg Oral Daily   Zinc Oxide   Topical BID   zinc sulfate  220 mg Oral Daily    PRN meds: acetaminophen **OR** acetaminophen, albuterol, hydrALAZINE, LORazepam, ondansetron **OR** ondansetron (ZOFRAN) IV, polyethylene glycol   Antimicrobials: Anti-infectives (From admission, onward)    Start     Dose/Rate Route Frequency Ordered Stop   07/07/22 1800  molnupiravir EUA (LAGEVRIO) capsule 800 mg        4 capsule Oral 2  times daily 07/07/22 1640 07/12/22 0927       Objective: Vitals:   07/12/22 2114 07/13/22 1356  BP: 122/68 (!) 88/44  Pulse: 66 65  Resp: 20 18  Temp: 97.7 F (36.5 C) 98 F (36.7 C)  SpO2: 98% 98%    Intake/Output Summary (Last 24 hours) at 07/13/2022 1419 Last data filed at 07/13/2022 8850 Gross per 24 hour  Intake 360 ml  Output 500 ml  Net -140 ml    Filed Weights   07/11/22 0500 07/12/22 0500 07/13/22 0500  Weight: 108.3 kg 107.6 kg 105.9 kg   Weight change: -1.75 kg Body mass index is 29.18 kg/m.   Physical Exam: General exam: Pleasant, elderly Caucasian male.  Lying on bed.  Not on physical distress skin: No rashes, lesions or ulcers. HEENT: Atraumatic, normocephalic, no obvious bleeding Lungs: Diminished air entry in both bases, coughs on deep breathing  CVS: Regular rate and rhythm, no murmur GI/Abd soft, nondistended obesity, nontender, bowel sound present CNS: Alert, awake.  Cheerful.  Oriented to place only Psychiatry: Mood appropriate Extremities: Chronic stasis changes.  Improving trace bilateral pedal edema.  No calf tenderness.   Data Review: I have personally reviewed the laboratory data and studies available.  F/u labs ordered Unresulted Labs (From admission, onward)    None       Signed, Terrilee Croak, MD [facility] 07/13/2022

## 2022-07-14 ENCOUNTER — Telehealth (HOSPITAL_BASED_OUTPATIENT_CLINIC_OR_DEPARTMENT_OTHER): Payer: Self-pay | Admitting: *Deleted

## 2022-07-14 DIAGNOSIS — R609 Edema, unspecified: Secondary | ICD-10-CM

## 2022-07-14 DIAGNOSIS — I509 Heart failure, unspecified: Secondary | ICD-10-CM | POA: Diagnosis not present

## 2022-07-14 DIAGNOSIS — I10 Essential (primary) hypertension: Secondary | ICD-10-CM

## 2022-07-14 DIAGNOSIS — Z7901 Long term (current) use of anticoagulants: Secondary | ICD-10-CM | POA: Diagnosis not present

## 2022-07-14 DIAGNOSIS — E119 Type 2 diabetes mellitus without complications: Secondary | ICD-10-CM

## 2022-07-14 DIAGNOSIS — J1282 Pneumonia due to coronavirus disease 2019: Secondary | ICD-10-CM

## 2022-07-14 DIAGNOSIS — F0390 Unspecified dementia without behavioral disturbance: Secondary | ICD-10-CM

## 2022-07-14 DIAGNOSIS — U071 COVID-19: Secondary | ICD-10-CM

## 2022-07-14 DIAGNOSIS — I480 Paroxysmal atrial fibrillation: Secondary | ICD-10-CM

## 2022-07-14 LAB — COMPREHENSIVE METABOLIC PANEL
ALT: 15 U/L (ref 0–44)
AST: 17 U/L (ref 15–41)
Albumin: 2.9 g/dL — ABNORMAL LOW (ref 3.5–5.0)
Alkaline Phosphatase: 58 U/L (ref 38–126)
Anion gap: 7 (ref 5–15)
BUN: 31 mg/dL — ABNORMAL HIGH (ref 8–23)
CO2: 29 mmol/L (ref 22–32)
Calcium: 8.5 mg/dL — ABNORMAL LOW (ref 8.9–10.3)
Chloride: 105 mmol/L (ref 98–111)
Creatinine, Ser: 1.11 mg/dL (ref 0.61–1.24)
GFR, Estimated: >60 mL/min (ref >60)
Glucose, Bld: 91 mg/dL (ref 70–99)
Potassium: 3.3 mmol/L — ABNORMAL LOW (ref 3.5–5.1)
Sodium: 141 mmol/L (ref 135–145)
Total Bilirubin: 1 mg/dL (ref 0.3–1.2)
Total Protein: 5.8 g/dL — ABNORMAL LOW (ref 6.5–8.1)

## 2022-07-14 LAB — C-REACTIVE PROTEIN: CRP: 0.5 mg/dL (ref <1.0)

## 2022-07-14 LAB — RESPIRATORY PANEL BY PCR

## 2022-07-14 LAB — GLUCOSE, CAPILLARY
Glucose-Capillary: 88 mg/dL (ref 70–99)
Glucose-Capillary: 99 mg/dL (ref 70–99)
Glucose-Capillary: 119 mg/dL — ABNORMAL HIGH (ref 70–99)
Glucose-Capillary: 106 mg/dL — ABNORMAL HIGH (ref 70–99)

## 2022-07-14 LAB — D-DIMER, QUANTITATIVE: D-Dimer, Quant: 1.59 ug/mL-FEU — ABNORMAL HIGH (ref 0.00–0.50)

## 2022-07-14 LAB — MAGNESIUM: Magnesium: 2.1 mg/dL (ref 1.7–2.4)

## 2022-07-14 LAB — LACTATE DEHYDROGENASE: LDH: 148 U/L (ref 98–192)

## 2022-07-14 LAB — FERRITIN: Ferritin: 142 ng/mL (ref 24–336)

## 2022-07-14 MED ORDER — IPRATROPIUM-ALBUTEROL 0.5-2.5 (3) MG/3ML IN SOLN
3.0000 mL | Freq: Four times a day (QID) | RESPIRATORY_TRACT | Status: DC
  Administered 2022-07-14: 3 mL via RESPIRATORY_TRACT
  Filled 2022-07-14: qty 3

## 2022-07-14 MED ORDER — POTASSIUM CHLORIDE CRYS ER 20 MEQ PO TBCR
40.0000 meq | EXTENDED_RELEASE_TABLET | Freq: Two times a day (BID) | ORAL | Status: AC
  Administered 2022-07-14: 40 meq via ORAL
  Filled 2022-07-14 (×2): qty 2

## 2022-07-14 MED ORDER — IPRATROPIUM-ALBUTEROL 0.5-2.5 (3) MG/3ML IN SOLN
3.0000 mL | Freq: Two times a day (BID) | RESPIRATORY_TRACT | Status: DC
  Administered 2022-07-15 – 2022-07-17 (×6): 3 mL via RESPIRATORY_TRACT
  Filled 2022-07-14 (×6): qty 3

## 2022-07-14 MED ORDER — POTASSIUM CHLORIDE CRYS ER 20 MEQ PO TBCR: 50.0000 meq | EXTENDED_RELEASE_TABLET | Freq: Once | ORAL | Status: DC

## 2022-07-14 NOTE — Telephone Encounter (Signed)
Left message to call back, needs to reschedule  Mychart message sent

## 2022-07-14 NOTE — Care Management Important Message (Signed)
Important Message  Patient Details IM Letter given Name: [name] MRN: [id number] Date of Birth: [birth year]   Medicare Important Message Given:  Yes     Kerin Salen 07/14/2022, 1:54 PM

## 2022-07-14 NOTE — Progress Notes (Addendum)
Physical Therapy Treatment Patient Details Name: [name] MRN: [id number] DOB: Dec 06, 1945 Today's Date: 07/14/2022   History of Present Illness [name] is a 76 y.o. male admitted with CHF exacerbation. Pt positive with covid19. PMH: CHF, diabetes, dementia    PT Comments    Pt has made significant improvements with functional mobility, only needing +1 assist with transfers and gait. Pt tolerates seated and standing BLE strengthening exercises, denies pain, on RA with SpO2 99%. Pt ambulates 55f x2 CGA using RW with seated rest break between, declines further ambulation due to being fatigued from exercises. Pt with significant improvement, may be able to go home with spouse support and HHPT with continued improvements.    Recommendations for follow up therapy are one component of a multi-disciplinary discharge planning process, led by the attending physician.  Recommendations may be updated based on patient status, additional functional criteria and insurance authorization.  Follow Up Recommendations  Skilled nursing-short term rehab (<3 hours/day) (vs HHPT) Can patient physically be transported by private vehicle: Yes   Assistance Recommended at Discharge Frequent or constant Supervision/Assistance  Patient can return home with the following A little help with walking and/or transfers;A little help with bathing/dressing/bathroom;Assistance with cooking/housework;Assist for transportation;Help with stairs or ramp for entrance   Equipment Recommendations  Rolling walker (2 wheels)    Recommendations for Other Services       Precautions / Restrictions Precautions Precautions: Fall Restrictions Weight Bearing Restrictions: No     Mobility  Bed Mobility  General bed mobility comments: in recliner    Transfers Overall transfer level: Needs assistance Equipment used: Rolling walker (2 wheels) Transfers: Sit to/from Stand Sit to Stand: Min guard  General  transfer comment: BUE assisting to power to stand, good steadiness with walker, prefers slouched posture but improves with commands    Ambulation/Gait Ambulation/Gait assistance: Min guard Gait Distance (Feet): 15 Feet (x2) Assistive device: Rolling walker (2 wheels) Gait Pattern/deviations: Step-through pattern, Decreased stride length, Trunk flexed Gait velocity: decreased  General Gait Details: step through gait pattern, prefers to stay in room due to fatigued from exercises, good steadiness, minimal cues for clearing past obstacles at safe distance, able to continue conversation without dyspnea   Stairs             Wheelchair Mobility    Modified Rankin (Stroke Patients Only)       Balance Overall balance assessment: Mild deficits observed, not formally tested         Cognition Arousal/Alertness: Awake/alert Behavior During Therapy: WFL for tasks assessed/performed Overall Cognitive Status: No family/caregiver present to determine baseline cognitive functioning  General Comments: pleasantly confused, follows commands well, actively participates        Exercises General Exercises - Lower Extremity Ankle Circles/Pumps: Seated, AROM, Both, 10 reps Quad Sets: Seated, AROM, Strengthening, Both, 10 reps (5 sec isometric hold) Straight Leg Raises: Seated, AROM, Strengthening, Both, 10 reps Hip Flexion/Marching: Standing, AROM, Strengthening, Both, 20 reps Heel Raises: 15 reps Mini-Sqauts: 5 reps    General Comments General comments (skin integrity, edema, etc.): Pt on RA with SpO2 99%      Pertinent Vitals/Pain Pain Assessment Pain Assessment: No/denies pain    Home Living                          Prior Function            PT Goals (current goals can now be found in the care plan  section) Acute Rehab PT Goals Patient Stated Goal: spouse reports goal is to ultimately return home, possibly SNF prior if she can't assist pt PT Goal Formulation:  With family Time For Goal Achievement: 07/22/22 Potential to Achieve Goals: Good Progress towards PT goals: Progressing toward goals    Frequency    Min 2X/week      PT Plan Current plan remains appropriate    Co-evaluation              AM-PAC PT "6 Clicks" Mobility   Outcome Measure  Help needed turning from your back to your side while in a flat bed without using bedrails?: A Lot Help needed moving from lying on your back to sitting on the side of a flat bed without using bedrails?: A Lot Help needed moving to and from a bed to a chair (including a wheelchair)?: A Little Help needed standing up from a chair using your arms (e.g., wheelchair or bedside chair)?: A Little Help needed to walk in hospital room?: A Little Help needed climbing 3-5 steps with a railing? : A Lot 6 Click Score: 15    End of Session   Activity Tolerance: Patient tolerated treatment well Patient left: in bed;with call bell/phone within reach;with chair alarm set Nurse Communication: Mobility status PT Visit Diagnosis: Other abnormalities of gait and mobility (R26.89);Muscle weakness (generalized) (M62.81)     Time: 3790-2409 PT Time Calculation (min) (ACUTE ONLY): 35 min  Charges:  $Therapeutic Exercise: 8-22 mins $Therapeutic Activity: 8-22 mins                      Tori Mariano Doshi PT, DPT 07/14/22, 2:36 PM

## 2022-07-14 NOTE — Progress Notes (Signed)
[name] SLH:[id number] DOB: 1946-06-26 DOA: 07/07/2022 PCP: [name], MD   Subj: 76 y.o. WM PMHx  mild dementia, obesity, OSA, DM type 2, HTN, HLD, PAF on Eliquis, nonischemic cardiomyopathy, chronic systolic CHF (EF 25 to 15% in July 2023), non-Hodgkin's lymphoma.  Presented to the ED with complaint of progressive COVID symptoms. 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 Patient was discharged to SNF where he stayed for 2 weeks and was discharged home.  Per his wife, at home, patient was able to walk with a walker.   From 9/18, patient started having fever, cough, shortness of breath, fatigue.  He tested positive for COVID test done at home on 9/19.  Wife called her PCP on 9/20 and was prescribed molnupiravir which she received 1 dose of so far.  Patient became more lethargic and hence EMS was called and he was brought to ED on 9/21   In the ED, patient was afebrile, and hemodynamically stable but required 4 L oxygen by nasal cannula.   BNP was significantly elevated to 4248. COVID PCR positive Chest x-ray showed pulm edema.  Given IV Lasix Admitted to hospitalist service for CHF exacerbation and COVID infection Clinically improved. Currently pending placement, needs to wait till the end isolation per COVID is over on 10/3.   Obj: A/O x1 (does not know where, when, why).  Extremely pleasant follows all commands.   Objective: VITAL SIGNS: Temp: 97.6 F (36.4 C) (09/28 2000) Temp Source: Oral (09/28 1418) BP: 108/63 (09/28 2000) Pulse Rate: 65 (09/28 2000) SPO2; 94% FIO2: Room air   Intake/Output Summary (Last 24 hours) at 07/14/2022 2038 Last data filed at 07/14/2022 1735 Gross per 24 hour  Intake 1080 ml  Output 700 ml  Net 380 ml     Exam: General: A/O x1 (does not know where, when, why).No acute respiratory distress Lungs: Clear to auscultation bilaterally without wheezes or  crackles Cardiovascular: Regular rate and rhythm without murmur gallop or rub normal S1 and S2 Abdomen: Nontender, nondistended, soft, bowel sounds positive, no rebound, no ascites, no appreciable mass Extremities: No significant cyanosis, clubbing, or edema bilateral lower extremities Skin: Negative rashes, lesions, ulcers Psychiatric:  Negative depression, negative anxiety, negative fatigue, negative mania  Central nervous system:  Cranial nerves II through XII intact, tongue/uvula midline, all extremities muscle strength 5/5, sensation intact throughout, negative dysarthria, negative expressive aphasia, negative receptive aphasia.  . Mobility Assessment (last 72 hours)     Mobility Assessment     Row Name 07/14/22 2000 07/14/22 1431 07/14/22 0930 07/13/22 2030 07/13/22 0930   Does patient have an order for bedrest or is patient medically unstable No - Continue assessment -- No - Continue assessment No - Continue assessment No - Continue assessment   What is the highest level of mobility based on the progressive mobility assessment? Level 4 (Walks with assist in room) - Balance while marching in place and cannot step forward and back - Complete Level 4 (Walks with assist in room) - Balance while marching in place and cannot step forward and back - Complete Level 4 (Walks with assist in room) - Balance while marching in place and cannot step forward and back - Complete Level 4 (Walks with assist in room) - Balance while marching in place and cannot step forward and back - Complete Level 4 (Walks with assist in room) - Balance while marching in place and cannot step forward and back - Complete  Is the above level different from baseline mobility prior to current illness? Yes - Recommend PT order -- Yes - Recommend PT order Yes - Recommend PT order Yes - Recommend PT order    [place] Name 07/12/22 2100 07/12/22 0917 07/12/22 0223       Does patient have an order for bedrest or is patient medically  unstable No - Continue assessment No - Continue assessment No - Continue assessment     What is the highest level of mobility based on the progressive mobility assessment? Level 4 (Walks with assist in room) - Balance while marching in place and cannot step forward and back - Complete Level 4 (Walks with assist in room) - Balance while marching in place and cannot step forward and back - Complete Level 4 (Walks with assist in room) - Balance while marching in place and cannot step forward and back - Complete     Is the above level different from baseline mobility prior to current illness? Yes - Recommend PT order Yes - Recommend PT order --                DVT prophylaxis:  Code Status:  Family Communication:  Status is: Inpatient    Dispo: The patient is from: Home              Anticipated d/c is to: SNF              Anticipated d/c date is: > 3 days              Patient currently is medically stable to d/c.    Procedures/Significant Events:    Consultants:     Cultures 9/28 respiratory virus panel pending 9/28 acute hepatitis panel pending    Antimicrobials: Anti-infectives (From admission, onward)    Start     Ordered Stop   07/07/22 1800  molnupiravir EUA (LAGEVRIO) capsule 800 mg        07/07/22 1640 07/12/22 0927        A/P  Acute on chronic systolic and diastolic CHF/Nonischemic cardiomyopathy -Strict in and out - Daily weight    Paroxysmal atrial fibrillation On Toprol and Eliquis Continue both   Essential hypertension Presented with shortness of breath, 1-2+ bilateral pedal edema Chest x-ray showed cardiomegaly, pulm vascular congestion and diffuse interstitial and alveolar opacities BNP was significantly elevated over 4000 Echo from July 2023 showed EF of 25 to 30% with global hypokinesis, grade 2 diastolic dysfunction He was adequately diuresed with IV Lasix.  Patient had about 8 L of output since admission  Pedal edema improved.  Weaned  off oxygen.  BNP improved.  Creatinine stabilized PTA on Toprol 25 mg daily, Lasix 40 mg daily, Aldactone 25 mg daily Continue Toprol and Lasix.  Aldactone remains on hold.  If blood pressure rises up, can resume. Defer to cardiology as an outpatient for initiation of any ARB or Entresto. Net IO Since Admission: -8,080 mL [07/13/22 1419] Continue to monitor for daily intake output, weight, blood pressure, BNP, renal function and electrolytes. Last Labs    Acute respiratory failure with hypoxia/COVID pneumonia Presented with worsening symptoms of COVID-19 infection COVID test: Positive at home on 9/19, positive in ED on admission on 9/21 Initial chest x-ray on admission showed pulmonary edema but it could be a mix of COVID-19 infiltrates as well. .  Initially IV Solu-Medrol was started as well which has now been switched to oral prednisone.  Plan for 10-day course to complete on  9/30. Continue antitussives, as needed inhalers Encourage ambulation -Completed the course of Molnupiravir -Zinc 220 mg daily - Vitamin C 500 mg daily -Completed 10-day course steroids on 9/30 - Flutter valve - Incentive spirometry -DuoNeb QID -  COVID-19 Labs  Recent Labs    07/14/22 0815  DDIMER 1.59*  FERRITIN 142  LDH 148  CRP 0.5    Lab Results  Component Value Date   SARSCOV2NAA POSITIVE (A) 07/07/2022   [place] NEGATIVE 05/06/2022   SARSCOV2NAA NOT DETECTED 06/25/2019   [place] NEGATIVE 04/07/2019      OSA Nightly CPAP   AKI BUN/creatinine elevated 29/1.29 yesterday because of overdiuresis.  IV Lasix was held with significant improvement in creatinine.  Oral Lasix has been resumed.  Continue to monitor. Recent Labs (within last 365 days)   Acute metabolic encephalopathy/Dementia without behavioral disturbance Anxiety/depression PTA on Zyprexa 2.5 mg twice daily, Ativan 0.5 mg 3 times daily as needed, Prozac 40 mg daily Despite those medicines, patient was having nightly  episodes of restlessness. 9/25, Zyprexa dose was doubled to 5 mg twice daily.  Melatonin nightly was added.  Other medicines were continued.  His mental status and nightly sleepiness have improved gradually.    Amiodarone-induced hypERthyroidism Recently hospitalized from 7/21 to 8/7 for altered mental status due to amiodarone-induced.  Amiodarone was stopped at the time and patient was started on methimazole.   TSH and free T4 level both seem to be improving but still not in target range. Continue methimazole. Needs to follow-up with endocrinology as an outpatient   Type 2 diabetes mellitus -9/21 hemoglobin A1c = 5.7 PTA on metformin 500 mg twice daily.  Not on insulin Currently metformin is on hold.  If renal function remains stable, can resume at discharge.  Continue sliding scale insulin with Accu-Cheks.     Hyperlipidemia Continue statin   BPH Continue Flomax   Morbid obesity  -Body mass index is 29.18 kg/m. Patient has been advised to make an attempt to improve diet and exercise patterns to aid in weight loss.     History of Non-Hodgkin's lymphoma.   Impaired mobility At home, patient at baseline is able to ambulate with a walker PT eval obtained.  SNF recommended.  However unable to discharge to SNF until 10/3 because of 10-day COVID isolation policy.  I have advised PT and mobility specialist to work with him every day.  Patient may actually improve sooner and end up going home directly from the hospital.    Stage II sacral decubitus ulcer Wound care consult appreciated  Hypokalemia - Potassium goal> 4 - 9/28 K-Dur 40 mEq x 2     Goals of care   Code Status: DNR.     Diet:      Care during the described time interval was provided by me .  I have reviewed this patient's available data, including medical history, events of note, physical examination, and all test results as part of my evaluation.

## 2022-07-14 NOTE — Progress Notes (Signed)
Patient denies previous CPAP use and declines use at this time as well. Order placed for PRN CPAP due to history of OSA. Patient is aware that he may request a machine at any time should he change his mind.

## 2022-07-14 NOTE — Telephone Encounter (Signed)
-----   Message from Loel Dubonnet, NP sent at 07/14/2022  7:35 AM EDT ----- 10/3 might be a bit soon. He probably needs follow up (virtual or in person) 1-2 weeks after discharge as this is his second admission over the last few months and due for 6 mos f/u. Could be with me or Dr. [name].   Loel Dubonnet, NP  ----- Message ----- From: Earvin Hansen, LPN Sent: 1/42/7670   5:00 PM EDT To: Loel Dubonnet, NP  Patient scheduled to see you 10/3 but looks like going to SNF on 10/3, currently admitted [facility] to cancel?

## 2022-07-15 DIAGNOSIS — Z7901 Long term (current) use of anticoagulants: Secondary | ICD-10-CM | POA: Diagnosis not present

## 2022-07-15 DIAGNOSIS — I509 Heart failure, unspecified: Secondary | ICD-10-CM | POA: Diagnosis not present

## 2022-07-15 DIAGNOSIS — R609 Edema, unspecified: Secondary | ICD-10-CM | POA: Diagnosis not present

## 2022-07-15 DIAGNOSIS — U071 COVID-19: Secondary | ICD-10-CM | POA: Diagnosis not present

## 2022-07-15 LAB — D-DIMER, QUANTITATIVE: D-Dimer, Quant: 1.64 ug/mL-FEU — ABNORMAL HIGH (ref 0.00–0.50)

## 2022-07-15 LAB — CBC WITH DIFFERENTIAL/PLATELET
Abs Immature Granulocytes: 0.06 10*3/uL (ref 0.00–0.07)
Basophils Absolute: 0 10*3/uL (ref 0.0–0.1)
Basophils Relative: 0 %
Eosinophils Absolute: 0.1 10*3/uL (ref 0.0–0.5)
Eosinophils Relative: 1 %
HCT: 43 % (ref 39.0–52.0)
Hemoglobin: 13.3 g/dL (ref 13.0–17.0)
Immature Granulocytes: 1 %
Lymphocytes Relative: 14 %
Lymphs Abs: 1.1 10*3/uL (ref 0.7–4.0)
MCH: 27.9 pg (ref 26.0–34.0)
MCHC: 30.9 g/dL (ref 30.0–36.0)
MCV: 90.1 fL (ref 80.0–100.0)
Monocytes Absolute: 0.6 10*3/uL (ref 0.1–1.0)
Monocytes Relative: 8 %
Neutro Abs: 5.9 10*3/uL (ref 1.7–7.7)
Neutrophils Relative %: 76 %
Platelets: 337 10*3/uL (ref 150–400)
RBC: 4.77 MIL/uL (ref 4.22–5.81)
RDW: 15.9 % — ABNORMAL HIGH (ref 11.5–15.5)
WBC: 7.8 10*3/uL (ref 4.0–10.5)
nRBC: 0 % (ref 0.0–0.2)

## 2022-07-15 LAB — MAGNESIUM: Magnesium: 2 mg/dL (ref 1.7–2.4)

## 2022-07-15 LAB — COMPREHENSIVE METABOLIC PANEL
ALT: 18 U/L (ref 0–44)
AST: 17 U/L (ref 15–41)
Albumin: 2.9 g/dL — ABNORMAL LOW (ref 3.5–5.0)
Alkaline Phosphatase: 61 U/L (ref 38–126)
Anion gap: 8 (ref 5–15)
BUN: 32 mg/dL — ABNORMAL HIGH (ref 8–23)
CO2: 28 mmol/L (ref 22–32)
Calcium: 8.6 mg/dL — ABNORMAL LOW (ref 8.9–10.3)
Chloride: 103 mmol/L (ref 98–111)
Creatinine, Ser: 1.16 mg/dL (ref 0.61–1.24)
GFR, Estimated: >60 mL/min (ref >60)
Glucose, Bld: 84 mg/dL (ref 70–99)
Potassium: 3.5 mmol/L (ref 3.5–5.1)
Sodium: 139 mmol/L (ref 135–145)
Total Bilirubin: 1 mg/dL (ref 0.3–1.2)
Total Protein: 5.9 g/dL — ABNORMAL LOW (ref 6.5–8.1)

## 2022-07-15 LAB — C-REACTIVE PROTEIN: CRP: <0.5 mg/dL (ref <1.0)

## 2022-07-15 LAB — GLUCOSE, CAPILLARY
Glucose-Capillary: 85 mg/dL (ref 70–99)
Glucose-Capillary: 116 mg/dL — ABNORMAL HIGH (ref 70–99)
Glucose-Capillary: 149 mg/dL — ABNORMAL HIGH (ref 70–99)
Glucose-Capillary: 119 mg/dL — ABNORMAL HIGH (ref 70–99)

## 2022-07-15 LAB — FERRITIN: Ferritin: 159 ng/mL (ref 24–336)

## 2022-07-15 LAB — LACTATE DEHYDROGENASE: LDH: 153 U/L (ref 98–192)

## 2022-07-15 LAB — PHOSPHORUS: Phosphorus: 3.3 mg/dL (ref 2.5–4.6)

## 2022-07-15 MED ORDER — ADULT MULTIVITAMIN W/MINERALS CH
1.0000 | ORAL_TABLET | Freq: Every day | ORAL | Status: DC
  Administered 2022-07-16 – 2022-07-19 (×4): 1 via ORAL
  Filled 2022-07-15 (×4): qty 1

## 2022-07-15 MED ORDER — ENSURE MAX PROTEIN PO LIQD
11.0000 [oz_av] | Freq: Every day | ORAL | Status: DC
  Administered 2022-07-15 – 2022-07-18 (×4): 11 [oz_av] via ORAL
  Filled 2022-07-15 (×4): qty 330

## 2022-07-15 MED ORDER — JUVEN PO PACK
1.0000 | PACK | Freq: Two times a day (BID) | ORAL | Status: DC
  Administered 2022-07-16 – 2022-07-19 (×7): 1 via ORAL
  Filled 2022-07-15 (×7): qty 1

## 2022-07-15 NOTE — Progress Notes (Signed)
Physical Therapy Treatment Patient Details Name: [name] MRN: [id number] DOB: 02-07-1946 Today's Date: 07/15/2022   History of Present Illness [name] is a 76 y.o. male admitted with CHF exacerbation. Pt positive with covid19. PMH: CHF, diabetes, dementia    PT Comments    The patient is pleasant, not oriented, participates in all exercises, ambulates with min guard.  Patient performed IS, and flutter valve, noted productive coughing . Continue PT for progressive mobility.  Recommendations for follow up therapy are one component of a multi-disciplinary discharge planning process, led by the attending physician.  Recommendations may be updated based on patient status, additional functional criteria and insurance authorization.  Follow Up Recommendations  Skilled nursing-short term rehab (<3 hours/day) Can patient physically be transported by private vehicle: Yes   Assistance Recommended at Discharge Frequent or constant Supervision/Assistance  Patient can return home with the following A little help with walking and/or transfers;A little help with bathing/dressing/bathroom;Assistance with cooking/housework;Assist for transportation;Help with stairs or ramp for entrance   Equipment Recommendations  None recommended by PT    Recommendations for Other Services       Precautions / Restrictions Precautions Precautions: Fall Precaution Comments: has tele sitter     Mobility  Bed Mobility   Bed Mobility: Supine to Sit     Supine to sit: Supervision, HOB elevated     General bed mobility comments: patient managed to sit self up without  assistance    Transfers Overall transfer level: Needs assistance Equipment used: Rolling walker (2 wheels) Transfers: Sit to/from Stand Sit to Stand: Min guard           General transfer comment: cues  for safety    Ambulation/Gait Ambulation/Gait assistance: Min guard Gait Distance (Feet): 50 Feet Assistive  device: Rolling walker (2 wheels) Gait Pattern/deviations: Step-through pattern, Decreased stride length, Trunk flexed Gait velocity: decreased     General Gait Details: step through gait pattern,   Stairs             Wheelchair Mobility    Modified Rankin (Stroke Patients Only)       Balance                                            Cognition Arousal/Alertness: Awake/alert Behavior During Therapy: WFL for tasks assessed/performed Overall Cognitive Status: No family/caregiver present to determine baseline cognitive functioning                                 General Comments: pleasantly confused, follows commands well, actively participates        Exercises Other Exercises Other Exercises: IS and flutter valve instruction , 10 reps, patient states that he has never seen the devices before    General Comments        Pertinent Vitals/Pain Pain Assessment Pain Assessment: No/denies pain    Home Living                          Prior Function            PT Goals (current goals can now be found in the care plan section) Progress towards PT goals: Progressing toward goals    Frequency    Min 2X/week      PT Plan Current  plan remains appropriate    Co-evaluation              AM-PAC PT "6 Clicks" Mobility   Outcome Measure  Help needed turning from your back to your side while in a flat bed without using bedrails?: None Help needed moving from lying on your back to sitting on the side of a flat bed without using bedrails?: A Little Help needed moving to and from a bed to a chair (including a wheelchair)?: A Little Help needed standing up from a chair using your arms (e.g., wheelchair or bedside chair)?: A Little Help needed to walk in hospital room?: A Little Help needed climbing 3-5 steps with a railing? : A Lot 6 Click Score: 18    End of Session   Activity Tolerance: Patient tolerated  treatment well Patient left: in chair;with call bell/phone within reach;with chair alarm set Nurse Communication: Mobility status PT Visit Diagnosis: Other abnormalities of gait and mobility (R26.89);Muscle weakness (generalized) (M62.81)     Time: 6484-7207 PT Time Calculation (min) (ACUTE ONLY): 35 min  Charges:  $Gait Training: 23-37 mins                     [place] Office [phone] Weekend [phone]    Claretha Cooper 07/15/2022, 1:03 PM

## 2022-07-15 NOTE — Progress Notes (Signed)
[name] ERD:[id number] DOB: February 06, 1946 DOA: 07/07/2022 PCP: [name], MD   Subj: 76 y.o. WM PMHx  mild dementia, obesity, OSA, DM type 2, HTN, HLD, PAF on Eliquis, nonischemic cardiomyopathy, chronic systolic CHF (EF 25 to 56% in July 2023), non-Hodgkin's lymphoma.  Presented to the ED with complaint of progressive COVID symptoms. 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 Patient was discharged to SNF where he stayed for 2 weeks and was discharged home.  Per his wife, at home, patient was able to walk with a walker.   From 9/18, patient started having fever, cough, shortness of breath, fatigue.  He tested positive for COVID test done at home on 9/19.  Wife called her PCP on 9/20 and was prescribed molnupiravir which she received 1 dose of so far.  Patient became more lethargic and hence EMS was called and he was brought to ED on 9/21   In the ED, patient was afebrile, and hemodynamically stable but required 4 L oxygen by nasal cannula.   BNP was significantly elevated to 4248. COVID PCR positive Chest x-ray showed pulm edema.  Given IV Lasix Admitted to hospitalist service for CHF exacerbation and COVID infection Clinically improved. Currently pending placement, needs to wait till the end isolation per COVID is over on 10/3.   Obj: 9/29 A/O x1 (does not know where, when, why).  Extremely pleasant follows all commands.   Objective: VITAL SIGNS: Temp: 97.9 F (36.6 C) (09/29 1307) Temp Source: Oral (09/29 1307) BP: 92/53 (09/29 1307) Pulse Rate: 59 (09/29 1307) SPO2; 94% FIO2: Room air   Intake/Output Summary (Last 24 hours) at 07/15/2022 2007 Last data filed at 07/15/2022 1300 Gross per 24 hour  Intake 780 ml  Output 300 ml  Net 480 ml      Exam: General: A/O x1 (does not know where, when, why).No acute respiratory distress Lungs: Clear to auscultation bilaterally without wheezes or  crackles Cardiovascular: Regular rate and rhythm without murmur gallop or rub normal S1 and S2 Abdomen: Nontender, nondistended, soft, bowel sounds positive, no rebound, no ascites, no appreciable mass Extremities: No significant cyanosis, clubbing, or edema bilateral lower extremities Skin: Negative rashes, lesions, ulcers Psychiatric:  Negative depression, negative anxiety, negative fatigue, negative mania  Central nervous system:  Cranial nerves II through XII intact, tongue/uvula midline, all extremities muscle strength 5/5, sensation intact throughout, negative dysarthria, negative expressive aphasia, negative receptive aphasia.  . Mobility Assessment (last 72 hours)     Mobility Assessment     Row Name 07/15/22 1301 07/15/22 0800 07/14/22 2000 07/14/22 1431 07/14/22 0930   Does patient have an order for bedrest or is patient medically unstable -- No - Continue assessment No - Continue assessment -- No - Continue assessment   What is the highest level of mobility based on the progressive mobility assessment? Level 4 (Walks with assist in room) - Balance while marching in place and cannot step forward and back - Complete Level 4 (Walks with assist in room) - Balance while marching in place and cannot step forward and back - Complete Level 4 (Walks with assist in room) - Balance while marching in place and cannot step forward and back - Complete Level 4 (Walks with assist in room) - Balance while marching in place and cannot step forward and back - Complete Level 4 (Walks with assist in room) - Balance while marching in place and cannot step forward and back - Complete  Is the above level different from baseline mobility prior to current illness? -- Yes - Recommend PT order Yes - Recommend PT order -- Yes - Recommend PT order    Row Name 07/13/22 2030 07/13/22 0930 07/12/22 2100       Does patient have an order for bedrest or is patient medically unstable No - Continue assessment No -  Continue assessment No - Continue assessment     What is the highest level of mobility based on the progressive mobility assessment? Level 4 (Walks with assist in room) - Balance while marching in place and cannot step forward and back - Complete Level 4 (Walks with assist in room) - Balance while marching in place and cannot step forward and back - Complete Level 4 (Walks with assist in room) - Balance while marching in place and cannot step forward and back - Complete     Is the above level different from baseline mobility prior to current illness? Yes - Recommend PT order Yes - Recommend PT order Yes - Recommend PT order                DVT prophylaxis:  Code Status:  Family Communication:  Status is: Inpatient    Dispo: The patient is from: Home              Anticipated d/c is to: SNF              Anticipated d/c date is: > 3 days              Patient currently is medically stable to d/c.    Procedures/Significant Events:    Consultants:     Cultures 9/28 respiratory virus panel pending 9/28 acute hepatitis panel pending    Antimicrobials: Anti-infectives (From admission, onward)    Start     Ordered Stop   07/07/22 1800  molnupiravir EUA (LAGEVRIO) capsule 800 mg        07/07/22 1640 07/12/22 0927        A/P  Acute on chronic systolic and diastolic CHF/Nonischemic cardiomyopathy -Strict in and out - Daily weight    Paroxysmal atrial fibrillation On Toprol and Eliquis Continue both   Essential hypertension Presented with shortness of breath, 1-2+ bilateral pedal edema Chest x-ray showed cardiomegaly, pulm vascular congestion and diffuse interstitial and alveolar opacities BNP was significantly elevated over 4000 Echo from July 2023 showed EF of 25 to 30% with global hypokinesis, grade 2 diastolic dysfunction He was adequately diuresed with IV Lasix.  Patient had about 8 L of output since admission  Pedal edema improved.  Weaned off oxygen.  BNP  improved.  Creatinine stabilized PTA on Toprol 25 mg daily, Lasix 40 mg daily, Aldactone 25 mg daily Continue Toprol and Lasix.  Aldactone remains on hold.  If blood pressure rises up, can resume. Defer to cardiology as an outpatient for initiation of any ARB or Entresto. Net IO Since Admission: -8,080 mL [07/13/22 1419] Continue to monitor for daily intake output, weight, blood pressure, BNP, renal function and electrolytes. Last Labs    Acute respiratory failure with hypoxia/COVID pneumonia Presented with worsening symptoms of COVID-19 infection COVID test: Positive at home on 9/19, positive in ED on admission on 9/21 Initial chest x-ray on admission showed pulmonary edema but it could be a mix of COVID-19 infiltrates as well. .  Initially IV Solu-Medrol was started as well which has now been switched to oral prednisone.  Plan for 10-day course to complete on  9/30. Continue antitussives, as needed inhalers Encourage ambulation -Completed the course of Molnupiravir -Zinc 220 mg daily - Vitamin C 500 mg daily -Completed 10-day course steroids on 9/30 - Flutter valve - Incentive spirometry -DuoNeb QID -  COVID-19 Labs  Recent Labs    07/14/22 0815 07/15/22 0535  DDIMER 1.59* 1.64*  FERRITIN 142 159  LDH 148 153  CRP 0.5 <0.5     Lab Results  Component Value Date   SARSCOV2NAA POSITIVE (A) 07/07/2022   [place] NEGATIVE 05/06/2022   SARSCOV2NAA NOT DETECTED 06/25/2019   [place] NEGATIVE 04/07/2019      [facility]A Nightly CPAP   AKI BUN/creatinine elevated 29/1.29 yesterday because of overdiuresis.  IV Lasix was held with significant improvement in creatinine.  Oral Lasix has been resumed.  Continue to monitor. Recent Labs (within last 365 days)   Acute metabolic encephalopathy/Dementia without behavioral disturbance Anxiety/depression PTA on Zyprexa 2.5 mg twice daily, Ativan 0.5 mg 3 times daily as needed, Prozac 40 mg daily Despite those medicines, patient was  having nightly episodes of restlessness. -9/25, Zyprexa dose was doubled to 5 mg BID -9/25 Melatonin nightly was added.   -mental status and nightly sleepiness have improved gradually.    Amiodarone-induced hypERthyroidism Recently hospitalized from 7/21 to 8/7 for altered mental status due to amiodarone-induced.  Amiodarone was stopped at the time and patient was started on methimazole.   TSH and free T4 level both seem to be improving but still not in target range. Continue methimazole. -follow-up with endocrinology as an outpatient   Type 2 diabetes mellitus -9/21 hemoglobin A1c = 5.7 PTA on metformin 500 mg twice daily.  Not on insulin Currently metformin is on hold.  If renal function remains stable, can resume at discharge.  Continue sliding scale insulin with Accu-Cheks.     Hyperlipidemia Continue statin   BPH Continue Flomax   Morbid obesity  -Body mass index is 29.18 kg/m. Patient has been advised to make an attempt to improve diet and exercise patterns to aid in weight loss.     History of Non-Hodgkin's lymphoma.   Impaired mobility At home, patient at baseline is able to ambulate with a walker PT eval obtained.  SNF recommended.  However unable to discharge to SNF until 10/3 because of 10-day COVID isolation policy.  I have advised PT and mobility specialist to work with him every day.  Patient may actually improve sooner and end up going home directly from the hospital.    Stage II sacral decubitus ulcer Wound care consult appreciated  Hypokalemia - Potassium goal> 4 - 9/28 K-Dur 40 mEq x 2     Goals of care   Code Status: DNR.     Diet:      Care during the described time interval was provided by me .  I have reviewed this patient's available data, including medical history, events of note, physical examination, and all test results as part of my evaluation.

## 2022-07-15 NOTE — Plan of Care (Signed)
  Problem: Coping: Goal: Level of anxiety will decrease Outcome: Progressing   Problem: Pain Managment: Goal: General experience of comfort will improve Outcome: Progressing   Problem: Safety: Goal: Ability to remain free from injury will improve Outcome: Progressing   Problem: Skin Integrity: Goal: Risk for impaired skin integrity will decrease Outcome: Progressing   Problem: Safety: Goal: Non-violent Restraint(s) Outcome: Completed/Met

## 2022-07-15 NOTE — Progress Notes (Signed)
Initial Nutrition Assessment  DOCUMENTATION CODES:   Not applicable  INTERVENTION:   -Double protein portions with meals -MVI with minerals daily -Continue 500 mg vitamin C BID -Continue 220 mg zinc sulfate daily -1 packet Juven BID, each packet provides 95 calories, 2.5 grams of protein (collagen), and 9.8 grams of carbohydrate (3 grams sugar); also contains 7 grams of L-arginine and L-glutamine, 300 mg vitamin C, 15 mg vitamin E, 1.2 mcg vitamin B-12, 9.5 mg zinc, 200 mg calcium, and 1.5 g  Calcium Beta-hydroxy-Beta-methylbutyrate to support wound healing  -Ensure Max po daily, each supplement provides 150 kcal and 30 grams of protein  NUTRITION DIAGNOSIS:   Increased nutrient needs related to wound healing as evidenced by estimated needs.  GOAL:   Patient will meet greater than or equal to 90% of their needs  MONITOR:   PO intake, Supplement acceptance  REASON FOR ASSESSMENT:   Malnutrition Screening Tool    ASSESSMENT:   Pt with PMH significant for mild dementia, obesity, OSA, DM2, HTN, HLD, PAF on Eliquis, nonischemic cardiomyopathy, chronic systolic CHF (EF 25 to 89% in July 2023), non-Hodgkin's lymphoma.  Admitted with complaint of progressive COVID symptoms.  Pt admitted with acute respiratory failure with hypoxia secondary to COVID-19 and CHF exacerbation.   Reviewed I/O's: +760 ml x 24 hours and -6.9 L since admission  UOP: 500 ml x 24 hours  Pt unavailable at time of visit. Attempted to speak with pt via call to hospital room phone, however, unable to reach. RD unable to obtain further nutrition-related history or complete nutrition-focused physical exam at this time.    Pt currently on a 2 gram sodium diet. Noted good oral intake; meal completions documented at 100%.   Reviewed wt hx; pt has experienced a 7.1% wt loss over the past month, which is significant for time frame.   Pt is at high risk for malnutrition given dementia, multiple cor-morbidities, and  increased nutritional needs secondary to wound healing and COVID-19, however, unable to identify at this time. Pt would greatly benefit from addition of oral nutrition supplements.   Per chart review, plan for SNF placement at discharge. Pt will remain in the hospital until 07/19/22 secondary to COVID isolation.   Medications reviewed and include vitamin C, lasix, melatonin, prednisone, and zinc sulfate.   Lab Results  Component Value Date   HGBA1C 5.7 (H) 07/07/2022   PTA DM medications are 500 mg metformin BID.   Labs reviewed: CBGS: 85-119 (inpatient orders for glycemic control are 0-5 units insulin aspart daily at bedtime, 0-9 units insulin aspart TID with meals).    Diet Order:   Diet Order             Diet 2 gram sodium Room service appropriate? Yes; Fluid consistency: Thin  Diet effective now                   EDUCATION NEEDS:   No education needs have been identified at this time  Skin:  Skin Assessment: Skin Integrity Issues: Skin Integrity Issues:: Stage II Stage II: bilateral sacrum  Last BM:  07/14/22  Height:   Ht Readings from Last 1 Encounters:  07/15/22 '6\' 3"'$  (1.905 m)    Weight:   Wt Readings from Last 1 Encounters:  07/15/22 103 kg    Ideal Body Weight:  89.1 kg  BMI:  Body mass index is 28.38 kg/m.  Estimated Nutritional Needs:   Kcal:  1694-5038  Protein:  130-145 grams  Fluid:  >  [place], RD, LDN, [place] Registered Dietitian II Certified Diabetes Care and Education Specialist Please refer to [facility] for RD and/or RD on-call/weekend/after hours pager

## 2022-07-15 NOTE — Telephone Encounter (Signed)
2nd call attempt, no answer, left message to call back for scheduling.

## 2022-07-16 LAB — COMPREHENSIVE METABOLIC PANEL
ALT: 19 U/L (ref 0–44)
AST: 18 U/L (ref 15–41)
Albumin: 3 g/dL — ABNORMAL LOW (ref 3.5–5.0)
Alkaline Phosphatase: 61 U/L (ref 38–126)
Anion gap: 7 (ref 5–15)
BUN: 34 mg/dL — ABNORMAL HIGH (ref 8–23)
CO2: 30 mmol/L (ref 22–32)
Calcium: 8.7 mg/dL — ABNORMAL LOW (ref 8.9–10.3)
Chloride: 104 mmol/L (ref 98–111)
Creatinine, Ser: 1.06 mg/dL (ref 0.61–1.24)
GFR, Estimated: >60 mL/min (ref >60)
Glucose, Bld: 78 mg/dL (ref 70–99)
Potassium: 3.5 mmol/L (ref 3.5–5.1)
Sodium: 141 mmol/L (ref 135–145)
Total Bilirubin: 0.9 mg/dL (ref 0.3–1.2)
Total Protein: 5.9 g/dL — ABNORMAL LOW (ref 6.5–8.1)

## 2022-07-16 LAB — CBC WITH DIFFERENTIAL/PLATELET
Abs Immature Granulocytes: 0.03 10*3/uL (ref 0.00–0.07)
Basophils Absolute: 0 10*3/uL (ref 0.0–0.1)
Basophils Relative: 0 %
Eosinophils Absolute: 0 10*3/uL (ref 0.0–0.5)
Eosinophils Relative: 0 %
HCT: 41.5 % (ref 39.0–52.0)
Hemoglobin: 12.9 g/dL — ABNORMAL LOW (ref 13.0–17.0)
Immature Granulocytes: 0 %
Lymphocytes Relative: 11 %
Lymphs Abs: 0.9 10*3/uL (ref 0.7–4.0)
MCH: 28 pg (ref 26.0–34.0)
MCHC: 31.1 g/dL (ref 30.0–36.0)
MCV: 90.2 fL (ref 80.0–100.0)
Monocytes Absolute: 0.8 10*3/uL (ref 0.1–1.0)
Monocytes Relative: 9 %
Neutro Abs: 6.5 10*3/uL (ref 1.7–7.7)
Neutrophils Relative %: 80 %
Platelets: 281 10*3/uL (ref 150–400)
RBC: 4.6 MIL/uL (ref 4.22–5.81)
RDW: 15.9 % — ABNORMAL HIGH (ref 11.5–15.5)
WBC: 8.2 10*3/uL (ref 4.0–10.5)
nRBC: 0 % (ref 0.0–0.2)

## 2022-07-16 LAB — LACTATE DEHYDROGENASE: LDH: 147 U/L (ref 98–192)

## 2022-07-16 LAB — HCV INTERPRETATION

## 2022-07-16 LAB — MAGNESIUM: Magnesium: 2.2 mg/dL (ref 1.7–2.4)

## 2022-07-16 LAB — GLUCOSE, CAPILLARY
Glucose-Capillary: 88 mg/dL (ref 70–99)
Glucose-Capillary: 100 mg/dL — ABNORMAL HIGH (ref 70–99)
Glucose-Capillary: 136 mg/dL — ABNORMAL HIGH (ref 70–99)
Glucose-Capillary: 139 mg/dL — ABNORMAL HIGH (ref 70–99)

## 2022-07-16 LAB — C-REACTIVE PROTEIN: CRP: 0.7 mg/dL (ref <1.0)

## 2022-07-16 LAB — D-DIMER, QUANTITATIVE: D-Dimer, Quant: 1.17 ug/mL-FEU — ABNORMAL HIGH (ref 0.00–0.50)

## 2022-07-16 LAB — FERRITIN: Ferritin: 184 ng/mL (ref 24–336)

## 2022-07-16 LAB — PHOSPHORUS: Phosphorus: 3.7 mg/dL (ref 2.5–4.6)

## 2022-07-16 NOTE — Progress Notes (Signed)
Mobility Specialist - Progress Note  During mobility: 118 bpm HR, 100/80 (87) BP Post-mobility: 63 bpm HR, 93/57 (67) BP   07/16/22 1200  Mobility  Activity Ambulated with assistance in room  Range of Motion/Exercises Active  Level of Assistance Minimal assist, patient does 75% or more  Assistive Device Front wheel walker  Distance Ambulated (ft) 30 ft  Activity Response Tolerated well  $Mobility charge 1 Mobility   Pt was found in recliner chair and agreeable to ambulate. Pt was min-A from sitting>standing and contact guard for ambulating. After taking a couple steps stated feeling dizzy and sat EOB and BP was taken. Pt stated feeling better after sitting for a couple minutes and ambulated in room. At EOS returned to recliner chair and stated feeling a little lightheaded. Was left with all necessities in reach and chair alarm on. RN was notified of session.  Ferd Hibbs Mobility Specialist

## 2022-07-16 NOTE — Progress Notes (Signed)
[name]:[id number] DOB: [birth year] DOA: 07/07/2022 PCP: [name], MD   Subj: 76 y.o. WM PMHx  mild dementia, obesity, OSA, DM type 2, HTN, HLD, PAF on Eliquis, nonischemic cardiomyopathy, chronic systolic CHF (EF 25 to 98% in July 2023), non-Hodgkin's lymphoma.  Presented to the ED with complaint of progressive COVID symptoms. 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 Patient was discharged to SNF where he stayed for 2 weeks and was discharged home.  Per his wife, at home, patient was able to walk with a walker.   From 9/18, patient started having fever, cough, shortness of breath, fatigue.  He tested positive for COVID test done at home on 9/19.  Wife called her PCP on 9/20 and was prescribed molnupiravir which she received 1 dose of so far.  Patient became more lethargic and hence EMS was called and he was brought to ED on 9/21   In the ED, patient was afebrile, and hemodynamically stable but required 4 L oxygen by nasal cannula.   BNP was significantly elevated to 4248. COVID PCR positive Chest x-ray showed pulm edema.  Given IV Lasix Admitted to hospitalist service for CHF exacerbation and COVID infection Clinically improved. Currently pending placement, needs to wait till the end isolation per COVID is over on 10/3.   Obj: 9/30 A/O x1 (does not know where, when, why).  Extremely pleasant follows all commands.   Objective: VITAL SIGNS: Temp: 98.2 F (36.8 C) (09/30 2005) Temp Source: Oral (09/30 2005) BP: 107/58 (09/30 2005) Pulse Rate: 73 (09/30 2005) SPO2; 94% FIO2: Room air   Intake/Output Summary (Last 24 hours) at 07/16/2022 2018 Last data filed at 07/16/2022 1805 Gross per 24 hour  Intake 720 ml  Output --  Net 720 ml      Exam: General: A/O x1 (does not know where, when, why).No acute respiratory distress Lungs: Clear to auscultation bilaterally without wheezes or  crackles Cardiovascular: Regular rate and rhythm without murmur gallop or rub normal S1 and S2 Abdomen: Nontender, nondistended, soft, bowel sounds positive, no rebound, no ascites, no appreciable mass Extremities: No significant cyanosis, clubbing, or edema bilateral lower extremities Skin: Negative rashes, lesions, ulcers Psychiatric:  Negative depression, negative anxiety, negative fatigue, negative mania  Central nervous system:  Cranial nerves II through XII intact, tongue/uvula midline, all extremities muscle strength 5/5, sensation intact throughout, negative dysarthria, negative expressive aphasia, negative receptive aphasia.  . Mobility Assessment (last 72 hours)     Mobility Assessment     Row Name 07/15/22 1301 07/15/22 0800 07/14/22 2000 07/14/22 1431 07/14/22 0930   Does patient have an order for bedrest or is patient medically unstable -- No - Continue assessment No - Continue assessment -- No - Continue assessment   What is the highest level of mobility based on the progressive mobility assessment? Level 4 (Walks with assist in room) - Balance while marching in place and cannot step forward and back - Complete Level 4 (Walks with assist in room) - Balance while marching in place and cannot step forward and back - Complete Level 4 (Walks with assist in room) - Balance while marching in place and cannot step forward and back - Complete Level 4 (Walks with assist in room) - Balance while marching in place and cannot step forward and back - Complete Level 4 (Walks with assist in room) - Balance while marching in place and cannot step forward and back - Complete   Is  the above level different from baseline mobility prior to current illness? -- Yes - Recommend PT order Yes - Recommend PT order -- Yes - Recommend PT order    Row Name 07/13/22 2030           Does patient have an order for bedrest or is patient medically unstable No - Continue assessment       What is the highest level  of mobility based on the progressive mobility assessment? Level 4 (Walks with assist in room) - Balance while marching in place and cannot step forward and back - Complete       Is the above level different from baseline mobility prior to current illness? Yes - Recommend PT order                  DVT prophylaxis:  Code Status:  Family Communication:  Status is: Inpatient    Dispo: The patient is from: Home              Anticipated d/c is to: SNF              Anticipated d/c date is: > 3 days              Patient currently is medically stable to d/c.    Procedures/Significant Events:    Consultants:     Cultures 9/28 respiratory virus panel pending 9/28 acute hepatitis panel pending    Antimicrobials: Anti-infectives (From admission, onward)    Start     Ordered Stop   07/07/22 1800  molnupiravir EUA (LAGEVRIO) capsule 800 mg        07/07/22 1640 07/12/22 0927        A/P  Acute on chronic systolic and diastolic CHF/Nonischemic cardiomyopathy -Strict in and out - Daily weight    Paroxysmal atrial fibrillation On Toprol and Eliquis Continue both   Essential hypertension Presented with shortness of breath, 1-2+ bilateral pedal edema Chest x-ray showed cardiomegaly, pulm vascular congestion and diffuse interstitial and alveolar opacities BNP was significantly elevated over 4000 Echo from July 2023 showed EF of 25 to 30% with global hypokinesis, grade 2 diastolic dysfunction He was adequately diuresed with IV Lasix.  Patient had about 8 L of output since admission  Pedal edema improved.  Weaned off oxygen.  BNP improved.  Creatinine stabilized PTA on Toprol 25 mg daily, Lasix 40 mg daily, Aldactone 25 mg daily Continue Toprol and Lasix.  Aldactone remains on hold.  If blood pressure rises up, can resume. Defer to cardiology as an outpatient for initiation of any ARB or Entresto. Net IO Since Admission: -8,080 mL [07/13/22 1419] Continue to monitor  for daily intake output, weight, blood pressure, BNP, renal function and electrolytes. Last Labs    Acute respiratory failure with hypoxia/COVID pneumonia Presented with worsening symptoms of COVID-19 infection COVID test: Positive at home on 9/19, positive in ED on admission on 9/21 Initial chest x-ray on admission showed pulmonary edema but it could be a mix of COVID-19 infiltrates as well. .  Initially IV Solu-Medrol was started as well which has now been switched to oral prednisone.  Plan for 10-day course to complete on 9/30. Continue antitussives, as needed inhalers Encourage ambulation -Completed the course of Molnupiravir -Zinc 220 mg daily - Vitamin C 500 mg daily -Completed 10-day course steroids on 9/30 - Flutter valve - Incentive spirometry -DuoNeb QID -  COVID-19 Labs  Recent Labs    07/14/22 0815 07/15/22 0535 07/16/22 0603  DDIMER  1.59* 1.64* 1.17*  FERRITIN 142 159 184  LDH 148 153 147  CRP 0.5 <0.5 0.7     Lab Results  Component Value Date   SARSCOV2NAA POSITIVE (A) 07/07/2022   [place] NEGATIVE 05/06/2022   SARSCOV2NAA NOT DETECTED 06/25/2019   [place] NEGATIVE 04/07/2019      [facility] Nightly CPAP   AKI BUN/creatinine elevated 29/1.29 yesterday because of overdiuresis.  IV Lasix was held with significant improvement in creatinine.  Oral Lasix has been resumed.  Continue to monitor. Recent Labs (within last 365 days)   Acute metabolic encephalopathy/Dementia without behavioral disturbance Anxiety/depression PTA on Zyprexa 2.5 mg twice daily, Ativan 0.5 mg 3 times daily as needed, Prozac 40 mg daily Despite those medicines, patient was having nightly episodes of restlessness. -9/25, Zyprexa dose was doubled to 5 mg BID -9/25 Melatonin nightly was added.   -mental status and nightly sleepiness have improved gradually.    Amiodarone-induced hypERthyroidism Recently hospitalized from 7/21 to 8/7 for altered mental status due to  amiodarone-induced.  Amiodarone was stopped at the time and patient was started on methimazole.   TSH and free T4 level both seem to be improving but still not in target range. Continue methimazole. -follow-up with endocrinology as an outpatient   Type 2 diabetes mellitus -9/21 hemoglobin A1c = 5.7 PTA on metformin 500 mg twice daily.  Not on insulin Currently metformin is on hold.  If renal function remains stable, can resume at discharge.  Continue sliding scale insulin with Accu-Cheks.     Hyperlipidemia Continue statin   BPH Continue Flomax   Morbid obesity  -Body mass index is 29.18 kg/m. Patient has been advised to make an attempt to improve diet and exercise patterns to aid in weight loss.     History of Non-Hodgkin's lymphoma.   Impaired mobility At home, patient at baseline is able to ambulate with a walker PT eval obtained.  SNF recommended.  However unable to discharge to SNF until 10/3 because of 10-day COVID isolation policy.  I have advised PT and mobility specialist to work with him every day.  Patient may actually improve sooner and end up going home directly from the hospital.    Stage II sacral decubitus ulcer Wound care consult appreciated  Hypokalemia - Potassium goal> 4 - 9/28 K-Dur 40 mEq x 2     Goals of care   Code Status: DNR.     Diet:      Care during the described time interval was provided by me .  I have reviewed this patient's available data, including medical history, events of note, physical examination, and all test results as part of my evaluation.

## 2022-07-17 DIAGNOSIS — I509 Heart failure, unspecified: Secondary | ICD-10-CM | POA: Diagnosis not present

## 2022-07-17 DIAGNOSIS — Z7901 Long term (current) use of anticoagulants: Secondary | ICD-10-CM | POA: Diagnosis not present

## 2022-07-17 DIAGNOSIS — R609 Edema, unspecified: Secondary | ICD-10-CM | POA: Diagnosis not present

## 2022-07-17 DIAGNOSIS — U071 COVID-19: Secondary | ICD-10-CM | POA: Diagnosis not present

## 2022-07-17 LAB — CBC WITH DIFFERENTIAL/PLATELET
Abs Immature Granulocytes: 0.04 10*3/uL (ref 0.00–0.07)
Basophils Absolute: 0 10*3/uL (ref 0.0–0.1)
Basophils Relative: 0 %
Eosinophils Absolute: 0 10*3/uL (ref 0.0–0.5)
Eosinophils Relative: 0 %
HCT: 36.9 % — ABNORMAL LOW (ref 39.0–52.0)
Hemoglobin: 11.6 g/dL — ABNORMAL LOW (ref 13.0–17.0)
Immature Granulocytes: 1 %
Lymphocytes Relative: 11 %
Lymphs Abs: 1 10*3/uL (ref 0.7–4.0)
MCH: 28.3 pg (ref 26.0–34.0)
MCHC: 31.4 g/dL (ref 30.0–36.0)
MCV: 90 fL (ref 80.0–100.0)
Monocytes Absolute: 0.6 10*3/uL (ref 0.1–1.0)
Monocytes Relative: 7 %
Neutro Abs: 7.2 10*3/uL (ref 1.7–7.7)
Neutrophils Relative %: 81 %
Platelets: 254 10*3/uL (ref 150–400)
RBC: 4.1 MIL/uL — ABNORMAL LOW (ref 4.22–5.81)
RDW: 15.9 % — ABNORMAL HIGH (ref 11.5–15.5)
WBC: 8.8 10*3/uL (ref 4.0–10.5)
nRBC: 0 % (ref 0.0–0.2)

## 2022-07-17 LAB — COMPREHENSIVE METABOLIC PANEL WITH GFR
ALT: 19 U/L (ref 0–44)
AST: 15 U/L (ref 15–41)
Albumin: 2.8 g/dL — ABNORMAL LOW (ref 3.5–5.0)
Alkaline Phosphatase: 55 U/L (ref 38–126)
Anion gap: 4 — ABNORMAL LOW (ref 5–15)
BUN: 40 mg/dL — ABNORMAL HIGH (ref 8–23)
CO2: 29 mmol/L (ref 22–32)
Calcium: 8.5 mg/dL — ABNORMAL LOW (ref 8.9–10.3)
Chloride: 105 mmol/L (ref 98–111)
Creatinine, Ser: 1.04 mg/dL (ref 0.61–1.24)
GFR, Estimated: >60 mL/min
Glucose, Bld: 95 mg/dL (ref 70–99)
Potassium: 3.5 mmol/L (ref 3.5–5.1)
Sodium: 138 mmol/L (ref 135–145)
Total Bilirubin: 0.7 mg/dL (ref 0.3–1.2)
Total Protein: 5.5 g/dL — ABNORMAL LOW (ref 6.5–8.1)

## 2022-07-17 LAB — PHOSPHORUS: Phosphorus: 3 mg/dL (ref 2.5–4.6)

## 2022-07-17 LAB — GLUCOSE, CAPILLARY
Glucose-Capillary: 82 mg/dL (ref 70–99)
Glucose-Capillary: 113 mg/dL — ABNORMAL HIGH (ref 70–99)
Glucose-Capillary: 221 mg/dL — ABNORMAL HIGH (ref 70–99)
Glucose-Capillary: 136 mg/dL — ABNORMAL HIGH (ref 70–99)

## 2022-07-17 LAB — MAGNESIUM: Magnesium: 2.1 mg/dL (ref 1.7–2.4)

## 2022-07-17 LAB — D-DIMER, QUANTITATIVE: D-Dimer, Quant: 1.29 ug/mL-FEU — ABNORMAL HIGH (ref 0.00–0.50)

## 2022-07-17 LAB — LACTATE DEHYDROGENASE: LDH: 125 U/L (ref 98–192)

## 2022-07-17 LAB — C-REACTIVE PROTEIN: CRP: <0.5 mg/dL

## 2022-07-17 LAB — FERRITIN: Ferritin: 167 ng/mL (ref 24–336)

## 2022-07-17 MED ORDER — IPRATROPIUM-ALBUTEROL 0.5-2.5 (3) MG/3ML IN SOLN: 3.0000 mL | Freq: Four times a day (QID) | RESPIRATORY_TRACT | Status: DC | PRN

## 2022-07-17 NOTE — TOC Progression Note (Signed)
Transition of Care [facility]) - Progression Note    Patient Details  Name: [name] MRN: [id number] Date of Birth: 01-02-1946  Transition of Care [facility]) CM/SW Contact  Ross Ludwig, [place] Phone Number: 07/17/2022, 7:28 PM  Clinical Narrative:     TOC continuing to follow patient's progress, patient should be able to discharge on 10/3 to [facility] pending bed availability, patient being medically ready for discharge, and out of restraints or mittens for 24 hours.  TOC to continue to follow.   Expected Discharge Plan: Skilled Nursing Facility Barriers to Discharge: Continued Medical Work up  Expected Discharge Plan and Services Expected Discharge Plan: [place]                                               Social Determinants of Health (SDOH) Interventions    Readmission Risk Interventions    07/11/2022   11:48 AM 05/23/2022   12:29 PM 05/12/2022    2:43 PM  Readmission Risk Prevention Plan  Transportation Screening Complete Complete Complete  PCP or Specialist Appt within 3-5 Days  Complete   HRI or Home Care Consult  Complete Complete  Social Work Consult for [place] Planning/Counseling  Complete Complete  Palliative Care Screening  Not Applicable Not Applicable  Medication Review Press photographer) Complete Complete   PCP or Specialist appointment within 3-5 days of discharge Complete    HRI or Home Care Consult Complete    SW Recovery Care/Counseling Consult Complete    Palliative Care Screening Not [place] Complete

## 2022-07-17 NOTE — Progress Notes (Signed)
[name] VHQ:[id number] DOB: 1946-06-18 DOA: 07/07/2022 PCP: [name], MD   Subj: 76 y.o. WM PMHx  mild dementia, obesity, OSA, DM type 2, HTN, HLD, PAF on Eliquis, nonischemic cardiomyopathy, chronic systolic CHF (EF 25 to 41% in July 2023), non-Hodgkin's lymphoma.  Presented to the ED with complaint of progressive COVID symptoms. 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 Patient was discharged to SNF where he stayed for 2 weeks and was discharged home.  Per his wife, at home, patient was able to walk with a walker.   From 9/18, patient started having fever, cough, shortness of breath, fatigue.  He tested positive for COVID test done at home on 9/19.  Wife called her PCP on 9/20 and was prescribed molnupiravir which she received 1 dose of so far.  Patient became more lethargic and hence EMS was called and he was brought to ED on 9/21   In the ED, patient was afebrile, and hemodynamically stable but required 4 L oxygen by nasal cannula.   BNP was significantly elevated to 4248. COVID PCR positive Chest x-ray showed pulm edema.  Given IV Lasix Admitted to hospitalist service for CHF exacerbation and COVID infection Clinically improved. Currently pending placement, needs to wait till the end isolation per COVID is over on 10/3.   Obj: 10/1 A/O x1 (does not know where, when, why).  Sitting in bed.  Extremely pleasant follows all commands.   Objective: VITAL SIGNS: Temp: 98.1 F (36.7 C) (10/01 1300) Temp Source: Oral (10/01 1300) BP: 99/54 (10/01 1300) Pulse Rate: 74 (10/01 1300) SPO2; 94% FIO2: Room air   Intake/Output Summary (Last 24 hours) at 07/17/2022 1847 Last data filed at 07/17/2022 1513 Gross per 24 hour  Intake 240 ml  Output 1150 ml  Net -910 ml      Exam: General: A/O x1 (does not know where, when, why).No acute respiratory distress Lungs: Clear to auscultation bilaterally without  wheezes or crackles Cardiovascular: Regular rate and rhythm without murmur gallop or rub normal S1 and S2 Abdomen: Nontender, nondistended, soft, bowel sounds positive, no rebound, no ascites, no appreciable mass Extremities: No significant cyanosis, clubbing, or edema bilateral lower extremities Skin: Negative rashes, lesions, ulcers Psychiatric:  Negative depression, negative anxiety, negative fatigue, negative mania  Central nervous system:  Cranial nerves II through XII intact, tongue/uvula midline, all extremities muscle strength 5/5, sensation intact throughout, negative dysarthria, negative expressive aphasia, negative receptive aphasia.  . Mobility Assessment (last 72 hours)     Mobility Assessment     Row Name 07/15/22 1301 07/15/22 0800 07/14/22 2000       Does patient have an order for bedrest or is patient medically unstable -- No - Continue assessment No - Continue assessment     What is the highest level of mobility based on the progressive mobility assessment? Level 4 (Walks with assist in room) - Balance while marching in place and cannot step forward and back - Complete Level 4 (Walks with assist in room) - Balance while marching in place and cannot step forward and back - Complete Level 4 (Walks with assist in room) - Balance while marching in place and cannot step forward and back - Complete     Is the above level different from baseline mobility prior to current illness? -- Yes - Recommend PT order Yes - Recommend PT order                DVT  prophylaxis:  Code Status:  Family Communication:  Status is: Inpatient    Dispo: The patient is from: Home              Anticipated d/c is to: SNF              Anticipated d/c date is: > 3 days              Patient currently is medically stable to d/c.    Procedures/Significant Events:    Consultants:     Cultures 9/28 respiratory virus panel pending 9/28 acute hepatitis panel  pending    Antimicrobials: Anti-infectives (From admission, onward)    Start     Ordered Stop   07/07/22 1800  molnupiravir EUA (LAGEVRIO) capsule 800 mg        07/07/22 1640 07/12/22 0927        A/P  Acute on chronic systolic and diastolic CHF/Nonischemic cardiomyopathy -Strict in and out - Daily weight    Paroxysmal atrial fibrillation On Toprol and Eliquis Continue both   Essential hypertension Presented with shortness of breath, 1-2+ bilateral pedal edema Chest x-ray showed cardiomegaly, pulm vascular congestion and diffuse interstitial and alveolar opacities BNP was significantly elevated over 4000 Echo from July 2023 showed EF of 25 to 30% with global hypokinesis, grade 2 diastolic dysfunction He was adequately diuresed with IV Lasix.  Patient had about 8 L of output since admission  Pedal edema improved.  Weaned off oxygen.  BNP improved.  Creatinine stabilized PTA on Toprol 25 mg daily, Lasix 40 mg daily, Aldactone 25 mg daily Continue Toprol and Lasix.  Aldactone remains on hold.  If blood pressure rises up, can resume. Defer to cardiology as an outpatient for initiation of any ARB or Entresto. Net IO Since Admission: -8,080 mL [07/13/22 1419] Continue to monitor for daily intake output, weight, blood pressure, BNP, renal function and electrolytes. Last Labs    Acute respiratory failure with hypoxia/COVID pneumonia Presented with worsening symptoms of COVID-19 infection COVID test: Positive at home on 9/19, positive in ED on admission on 9/21 Initial chest x-ray on admission showed pulmonary edema but it could be a mix of COVID-19 infiltrates as well. COVID-19 Labs  Recent Labs    07/15/22 0535 07/16/22 0603 07/17/22 0555  DDIMER 1.64* 1.17* 1.29*  FERRITIN 159 184 167  LDH 153 147 125  CRP <0.5 0.7 <0.5    Lab Results  Component Value Date   SARSCOV2NAA POSITIVE (A) 07/07/2022   SARSCOV2NAA NEGATIVE 05/06/2022   SARSCOV2NAA NOT DETECTED  06/25/2019   [place] NEGATIVE 04/07/2019  -Initially IV Solu-Medrol was started as well which has now been switched to oral prednisone.  -Plan for 10-day course to complete on 9/30. -Continue antitussives, as needed inhalers -Encourage ambulation -Completed the course of Molnupiravir -Zinc 220 mg daily - Vitamin C 500 mg daily -Completed 10-day course steroids on 9/30 - Flutter valve - Incentive spirometry -DuoNeb QID -  COVID-19 Labs  Recent Labs    07/15/22 0535 07/16/22 0603 07/17/22 0555  DDIMER 1.64* 1.17* 1.29*  FERRITIN 159 184 167  LDH 153 147 125  CRP <0.5 0.7 <0.5     Lab Results  Component Value Date   SARSCOV2NAA POSITIVE (A) 07/07/2022   [place] NEGATIVE 05/06/2022   SARSCOV2NAA NOT DETECTED 06/25/2019   [place] NEGATIVE 04/07/2019      OSA Nightly CPAP   AKI BUN/creatinine elevated 29/1.29 yesterday because of overdiuresis.  IV Lasix was held with significant improvement in creatinine.  Oral Lasix has been resumed.  Continue to monitor. Recent Labs (within last 365 days)   Acute metabolic encephalopathy/Dementia without behavioral disturbance Anxiety/depression PTA on Zyprexa 2.5 mg twice daily, Ativan 0.5 mg 3 times daily as needed, Prozac 40 mg daily Despite those medicines, patient was having nightly episodes of restlessness. -9/25, Zyprexa dose was doubled to 5 mg BID -9/25 Melatonin nightly was added.   -mental status and nightly sleepiness have improved gradually.    Amiodarone-induced hypERthyroidism Recently hospitalized from 7/21 to 8/7 for altered mental status due to amiodarone-induced.  Amiodarone was stopped at the time and patient was started on methimazole.   TSH and free T4 level both seem to be improving but still not in target range. Continue methimazole. -follow-up with endocrinology as an outpatient   Type 2 diabetes mellitus -9/21 hemoglobin A1c = 5.7 PTA on metformin 500 mg twice daily.  Not on  insulin Currently metformin is on hold.  If renal function remains stable, can resume at discharge.  Continue sliding scale insulin with Accu-Cheks.     Hyperlipidemia Continue statin   BPH Continue Flomax   Morbid obesity  -Body mass index is 29.18 kg/m. Patient has been advised to make an attempt to improve diet and exercise patterns to aid in weight loss.     History of Non-Hodgkin's lymphoma.   Impaired mobility At home, patient at baseline is able to ambulate with a walker PT eval obtained.  SNF recommended.  However unable to discharge to SNF until 10/3 because of 10-day COVID isolation policy.  I have advised PT and mobility specialist to work with him every day.  Patient may actually improve sooner and end up going home directly from the hospital.    Stage II sacral decubitus ulcer Wound care consult appreciated  Hypokalemia - Potassium goal> 4 - 9/28 K-Dur 40 mEq x 2     Goals of care   Code Status: DNR.     Diet:      Care during the described time interval was provided by me .  I have reviewed this patient's available data, including medical history, events of note, physical examination, and all test results as part of my evaluation.

## 2022-07-18 DIAGNOSIS — R609 Edema, unspecified: Secondary | ICD-10-CM | POA: Diagnosis not present

## 2022-07-18 DIAGNOSIS — Z7901 Long term (current) use of anticoagulants: Secondary | ICD-10-CM | POA: Diagnosis not present

## 2022-07-18 DIAGNOSIS — I509 Heart failure, unspecified: Secondary | ICD-10-CM | POA: Diagnosis not present

## 2022-07-18 DIAGNOSIS — U071 COVID-19: Secondary | ICD-10-CM | POA: Diagnosis not present

## 2022-07-18 LAB — LACTATE DEHYDROGENASE: LDH: 142 U/L (ref 98–192)

## 2022-07-18 LAB — PHOSPHORUS: Phosphorus: 3.1 mg/dL (ref 2.5–4.6)

## 2022-07-18 LAB — COMPREHENSIVE METABOLIC PANEL
ALT: 19 U/L (ref 0–44)
AST: 16 U/L (ref 15–41)
Albumin: 2.7 g/dL — ABNORMAL LOW (ref 3.5–5.0)
Alkaline Phosphatase: 55 U/L (ref 38–126)
Anion gap: 5 (ref 5–15)
BUN: 38 mg/dL — ABNORMAL HIGH (ref 8–23)
CO2: 29 mmol/L (ref 22–32)
Calcium: 8.5 mg/dL — ABNORMAL LOW (ref 8.9–10.3)
Chloride: 106 mmol/L (ref 98–111)
Creatinine, Ser: 1.11 mg/dL (ref 0.61–1.24)
GFR, Estimated: >60 mL/min (ref >60)
Glucose, Bld: 104 mg/dL — ABNORMAL HIGH (ref 70–99)
Potassium: 4.5 mmol/L (ref 3.5–5.1)
Sodium: 140 mmol/L (ref 135–145)
Total Bilirubin: 0.8 mg/dL (ref 0.3–1.2)
Total Protein: 5.4 g/dL — ABNORMAL LOW (ref 6.5–8.1)

## 2022-07-18 LAB — CBC WITH DIFFERENTIAL/PLATELET
Abs Immature Granulocytes: 0.05 10*3/uL (ref 0.00–0.07)
Basophils Absolute: 0 10*3/uL (ref 0.0–0.1)
Basophils Relative: 0 %
Eosinophils Absolute: 0 10*3/uL (ref 0.0–0.5)
Eosinophils Relative: 0 %
HCT: 38.1 % — ABNORMAL LOW (ref 39.0–52.0)
Hemoglobin: 11.9 g/dL — ABNORMAL LOW (ref 13.0–17.0)
Immature Granulocytes: 1 %
Lymphocytes Relative: 8 %
Lymphs Abs: 0.8 10*3/uL (ref 0.7–4.0)
MCH: 28.3 pg (ref 26.0–34.0)
MCHC: 31.2 g/dL (ref 30.0–36.0)
MCV: 90.7 fL (ref 80.0–100.0)
Monocytes Absolute: 0.7 10*3/uL (ref 0.1–1.0)
Monocytes Relative: 7 %
Neutro Abs: 8.5 10*3/uL — ABNORMAL HIGH (ref 1.7–7.7)
Neutrophils Relative %: 84 %
Platelets: 254 10*3/uL (ref 150–400)
RBC: 4.2 MIL/uL — ABNORMAL LOW (ref 4.22–5.81)
RDW: 16 % — ABNORMAL HIGH (ref 11.5–15.5)
WBC: 10.1 10*3/uL (ref 4.0–10.5)
nRBC: 0 % (ref 0.0–0.2)

## 2022-07-18 LAB — FERRITIN: Ferritin: 144 ng/mL (ref 24–336)

## 2022-07-18 LAB — MAGNESIUM: Magnesium: 2.2 mg/dL (ref 1.7–2.4)

## 2022-07-18 LAB — GLUCOSE, CAPILLARY
Glucose-Capillary: 121 mg/dL — ABNORMAL HIGH (ref 70–99)
Glucose-Capillary: 97 mg/dL (ref 70–99)
Glucose-Capillary: 105 mg/dL — ABNORMAL HIGH (ref 70–99)
Glucose-Capillary: 122 mg/dL — ABNORMAL HIGH (ref 70–99)

## 2022-07-18 LAB — C-REACTIVE PROTEIN: CRP: <0.5 mg/dL (ref <1.0)

## 2022-07-18 LAB — D-DIMER, QUANTITATIVE: D-Dimer, Quant: 0.79 ug/mL-FEU — ABNORMAL HIGH (ref 0.00–0.50)

## 2022-07-18 NOTE — TOC Progression Note (Signed)
Transition of Care [facility]) - Progression Note    Patient Details  Name: QUINNTEN CALVIN MRN: [id number] Date of Birth: 1946/09/21  Transition of Care [facility]) CM/SW Contact  Affie Gasner, Juliann Pulse, RN Phone Number: 07/18/2022, 10:54 AM  Clinical Narrative:  [facility] Nov[facility] rep [facility] able to accept if stable for d/c tomorrow.     Expected Discharge Plan: Skilled Nursing Facility Barriers to Discharge: Continued Medical Work up  Expected Discharge Plan and Services Expected Discharge Plan: [place]                                               Social Determinants of Health (SDOH) Interventions    Readmission Risk Interventions    07/11/2022   11:48 AM 05/23/2022   12:29 PM 05/12/2022    2:43 PM  Readmission Risk Prevention Plan  Transportation Screening Complete Complete Complete  PCP or Specialist Appt within 3-5 Days  Complete   HRI or Home Care Consult  Complete Complete  Social Work Consult for [place] Planning/Counseling  Complete Complete  Palliative Care Screening  Not Applicable Not Applicable  Medication Review Press photographer) Complete Complete   PCP or Specialist appointment within 3-5 days of discharge Complete    HRI or Home Care Consult Complete    SW Recovery Care/Counseling Consult Complete    Palliative Care Screening Not [place] Complete

## 2022-07-18 NOTE — Progress Notes (Signed)
[name] HGD:[id number] DOB: 04-12-1946 DOA: 07/07/2022 PCP: [name], MD   Subj: 76 y.o. WM PMHx  mild dementia, obesity, OSA, DM type 2, HTN, HLD, PAF on Eliquis, nonischemic cardiomyopathy, chronic systolic CHF (EF 25 to 96% in July 2023), non-Hodgkin's lymphoma.  Presented to the ED with complaint of progressive COVID symptoms. 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 Patient was discharged to SNF where he stayed for 2 weeks and was discharged home.  Per his wife, at home, patient was able to walk with a walker.   From 9/18, patient started having fever, cough, shortness of breath, fatigue.  He tested positive for COVID test done at home on 9/19.  Wife called her PCP on 9/20 and was prescribed molnupiravir which she received 1 dose of so far.  Patient became more lethargic and hence EMS was called and he was brought to ED on 9/21   In the ED, patient was afebrile, and hemodynamically stable but required 4 L oxygen by nasal cannula.   BNP was significantly elevated to 4248. COVID PCR positive Chest x-ray showed pulm edema.  Given IV Lasix Admitted to hospitalist service for CHF exacerbation and COVID infection Clinically improved. Currently pending placement, needs to wait till the end isolation per COVID is over on 10/3.   Obj: 10/2 A/O x1 (does not know where, when, why).  Sitting in bed.  Extremely pleasant follows all commands.   Objective: VITAL SIGNS: Temp: 97.6 F (36.4 C) (10/02 1328) Temp Source: Oral (10/02 1328) BP: 98/56 (10/02 1328) Pulse Rate: 64 (10/02 1328) SPO2; 94% FIO2: Room air   Intake/Output Summary (Last 24 hours) at 07/18/2022 1513 Last data filed at 07/18/2022 0954 Gross per 24 hour  Intake 600 ml  Output 1400 ml  Net -800 ml      Exam: General: A/O x1 (does not know where, when, why).No acute respiratory distress Lungs: Clear to auscultation bilaterally without  wheezes or crackles Cardiovascular: Regular rate and rhythm without murmur gallop or rub normal S1 and S2 Abdomen: Nontender, nondistended, soft, bowel sounds positive, no rebound, no ascites, no appreciable mass Extremities: No significant cyanosis, clubbing, or edema bilateral lower extremities Skin: Negative rashes, lesions, ulcers Psychiatric:  Negative depression, negative anxiety, negative fatigue, negative mania  Central nervous system:  Cranial nerves II through XII intact, tongue/uvula midline, all extremities muscle strength 5/5, sensation intact throughout, negative dysarthria, negative expressive aphasia, negative receptive aphasia.  . Mobility Assessment (last 72 hours)     Mobility Assessment     Row Name 07/18/22 0847 07/17/22 2000 07/17/22 1100       Does patient have an order for bedrest or is patient medically unstable No - Continue assessment No - Continue assessment No - Continue assessment     What is the highest level of mobility based on the progressive mobility assessment? Level 5 (Walks with assist in room/hall) - Balance while stepping forward/back and can walk in room with assist - Complete Level 4 (Walks with assist in room) - Balance while marching in place and cannot step forward and back - Complete Level 4 (Walks with assist in room) - Balance while marching in place and cannot step forward and back - Complete     Is the above level different from baseline mobility prior to current illness? -- Yes - Recommend PT order Yes - Recommend PT order  DVT prophylaxis:  Code Status:  Family Communication: 10/1 wife at bedside for discussion of plan of care all questions answered Status is: Inpatient    Dispo: The patient is from: Home              Anticipated d/c is to: Home              Anticipated d/c date is: 1 day              patient currently is medically stable to d/c.    Procedures/Significant Events:    Consultants:      Cultures 9/28 respiratory virus panel pending 9/28 acute hepatitis panel pending    Antimicrobials: Anti-infectives (From admission, onward)    Start     Ordered Stop   07/07/22 1800  molnupiravir EUA (LAGEVRIO) capsule 800 mg        07/07/22 1640 07/12/22 0927        A/P  Acute on chronic systolic and diastolic CHF/Nonischemic cardiomyopathy -Strict in and out - Daily weight    Paroxysmal atrial fibrillation On Toprol and Eliquis Continue both   Essential hypertension Presented with shortness of breath, 1-2+ bilateral pedal edema Chest x-ray showed cardiomegaly, pulm vascular congestion and diffuse interstitial and alveolar opacities BNP was significantly elevated over 4000 Echo from July 2023 showed EF of 25 to 30% with global hypokinesis, grade 2 diastolic dysfunction He was adequately diuresed with IV Lasix.  Patient had about 8 L of output since admission  Pedal edema improved.  Weaned off oxygen.  BNP improved.  Creatinine stabilized PTA on Toprol 25 mg daily, Lasix 40 mg daily, Aldactone 25 mg daily Continue Toprol and Lasix.  Aldactone remains on hold.  If blood pressure rises up, can resume. Defer to cardiology as an outpatient for initiation of any ARB or Entresto. Net IO Since Admission: -8,080 mL [07/13/22 1419] Continue to monitor for daily intake output, weight, blood pressure, BNP, renal function and electrolytes. Last Labs    Acute respiratory failure with hypoxia/COVID pneumonia Presented with worsening symptoms of COVID-19 infection COVID test: Positive at home on 9/19, positive in ED on admission on 9/21 Initial chest x-ray on admission showed pulmonary edema but it could be a mix of COVID-19 infiltrates as well. COVID-19 Labs  Recent Labs    07/16/22 0603 07/17/22 0555 07/18/22 0437  DDIMER 1.17* 1.29* 0.79*  FERRITIN 184 167 144  LDH 147 125 142  CRP 0.7 <0.5 <0.5     Lab Results  Component Value Date   SARSCOV2NAA POSITIVE  (A) 07/07/2022   SARSCOV2NAA NEGATIVE 05/06/2022   SARSCOV2NAA NOT DETECTED 06/25/2019   [place] NEGATIVE 04/07/2019  -Initially IV Solu-Medrol was started as well which has now been switched to oral prednisone.  -Plan for 10-day course to complete on 9/30. -Continue antitussives, as needed inhalers -Encourage ambulation -Completed the course of Molnupiravir -Zinc 220 mg daily - Vitamin C 500 mg daily -Completed 10-day course steroids on 9/30 - Flutter valve - Incentive spirometry -DuoNeb QID -  COVID-19 Labs  Recent Labs    07/16/22 0603 07/17/22 0555 07/18/22 0437  DDIMER 1.17* 1.29* 0.79*  FERRITIN 184 167 144  LDH 147 125 142  CRP 0.7 <0.5 <0.5     Lab Results  Component Value Date   SARSCOV2NAA POSITIVE (A) 07/07/2022   SARSCOV2NAA NEGATIVE 05/06/2022   SARSCOV2NAA NOT DETECTED 06/25/2019   [place] NEGATIVE 04/07/2019      OSA Nightly CPAP   AKI BUN/creatinine elevated 29/1.29 yesterday  because of overdiuresis.  IV Lasix was held with significant improvement in creatinine.  Oral Lasix has been resumed.  Continue to monitor. Recent Labs (within last 365 days)   Acute metabolic encephalopathy/Dementia without behavioral disturbance Anxiety/depression PTA on Zyprexa 2.5 mg twice daily, Ativan 0.5 mg 3 times daily as needed, Prozac 40 mg daily Despite those medicines, patient was having nightly episodes of restlessness. -9/25, Zyprexa dose was doubled to 5 mg BID -9/25 Melatonin nightly was added.   -mental status and nightly sleepiness have improved gradually.    Amiodarone-induced hypERthyroidism Recently hospitalized from 7/21 to 8/7 for altered mental status due to amiodarone-induced.  Amiodarone was stopped at the time and patient was started on methimazole.   TSH and free T4 level both seem to be improving but still not in target range. Continue methimazole. -follow-up with endocrinology as an outpatient   Type 2 diabetes mellitus -9/21  hemoglobin A1c = 5.7 PTA on metformin 500 mg twice daily.  Not on insulin Currently metformin is on hold.  If renal function remains stable, can resume at discharge.  Continue sliding scale insulin with Accu-Cheks.     Hyperlipidemia Continue statin   BPH Continue Flomax   Morbid obesity  -Body mass index is 29.18 kg/m. Patient has been advised to make an attempt to improve diet and exercise patterns to aid in weight loss.     History of Non-Hodgkin's lymphoma.   Impaired mobility At home, patient at baseline is able to ambulate with a walker PT eval obtained.  SNF recommended.  However unable to discharge to SNF until 10/3 because of 10-day COVID isolation policy.  I have advised PT and mobility specialist to work with him every day.  Patient may actually improve sooner and end up going home directly from the hospital.    Stage II sacral decubitus ulcer Wound care consult appreciated  Hypokalemia - Potassium goal> 4 - 9/28 K-Dur 40 mEq x 2     Goals of care   Code Status: DNR.     Diet:      Care during the described time interval was provided by me .  I have reviewed this patient's available data, including medical history, events of note, physical examination, and all test results as part of my evaluation.

## 2022-07-18 NOTE — TOC Progression Note (Signed)
Transition of Care [facility]) - Progression Note    Patient Details  Name: JANDEL PATRIARCA MRN: [id number] Date of Birth: 1946/03/14  Transition of Care [facility]) CM/SW Contact  Joaquin Courts, RN Phone Number: 07/18/2022, 3:24 PM  Clinical Narrative:    CM received notification from [facility] that patient is in copay days and will have a 200$ per day copay for SNF stay. Facility reports they have investigated the patient's supplemental policy and found that it does not have any snf coverage benefits and therefore would not cover the snf stay. CM spoke with patient's spouse and updated her of this information.  Spouse states that they cannot afford to pay the copay and elects for patient to return home with HHPT/OT services instead.  MD updated and orders requested.   Expected Discharge Plan: Skilled Nursing Facility Barriers to Discharge: Continued Medical Work up  Expected Discharge Plan and Services Expected Discharge Plan: [place]                                               Social Determinants of Health (SDOH) Interventions    Readmission Risk Interventions    07/11/2022   11:48 AM 05/23/2022   12:29 PM 05/12/2022    2:43 PM  Readmission Risk Prevention Plan  Transportation Screening Complete Complete Complete  PCP or Specialist Appt within 3-5 Days  Complete   HRI or Home Care Consult  Complete Complete  Social Work Consult for [place] Planning/Counseling  Complete Complete  Palliative Care Screening  Not Applicable Not Applicable  Medication Review Press photographer) Complete Complete   PCP or Specialist appointment within 3-5 days of discharge Complete    HRI or Home Care Consult Complete    SW Recovery Care/Counseling Consult Complete    Palliative Care Screening Not [place] Complete

## 2022-07-18 NOTE — Progress Notes (Signed)
Physical Therapy Treatment Patient Details Name: [name] MRN: [id number] DOB: [birth year] Today's Date: 07/18/2022   History of Present Illness [name] is a 76 y.o. male admitted with CHF exacerbation. Pt positive with covid19. PMH: CHF, diabetes, dementia    PT Comments    Pt ambulated in hallway and upon returning to room, lunch arrived.  Pt set up for lunch in recliner.  Pt very pleasant and motivated.  Current plan is for d/c to SNF.   Recommendations for follow up therapy are one component of a multi-disciplinary discharge planning process, led by the attending physician.  Recommendations may be updated based on patient status, additional functional criteria and insurance authorization.  Follow Up Recommendations  Skilled nursing-short term rehab (<3 hours/day) Can patient physically be transported by private vehicle: Yes   Assistance Recommended at Discharge Frequent or constant Supervision/Assistance  Patient can return home with the following A little help with walking and/or transfers;A little help with bathing/dressing/bathroom;Assistance with cooking/housework;Assist for transportation;Help with stairs or ramp for entrance   Equipment Recommendations  None recommended by PT    Recommendations for Other Services       Precautions / Restrictions Precautions Precautions: Fall     Mobility  Bed Mobility               General bed mobility comments: pt in recliner    Transfers Overall transfer level: Needs assistance Equipment used: Rolling walker (2 wheels) Transfers: Sit to/from Stand Sit to Stand: Min guard           General transfer comment: min/guard for safety, cues for hand placement to self assist    Ambulation/Gait Ambulation/Gait assistance: Min guard Gait Distance (Feet): 400 Feet Assistive device: Rolling walker (2 wheels) Gait Pattern/deviations: Step-through pattern, Decreased stride length, Trunk flexed        General Gait Details: cues for RW positioning, posture, denies any dizziness   Stairs             Wheelchair Mobility    Modified Rankin (Stroke Patients Only)       Balance                                            Cognition Arousal/Alertness: Awake/alert Behavior During Therapy: WFL for tasks assessed/performed Overall Cognitive Status: No family/caregiver present to determine baseline cognitive functioning                                 General Comments: pleasantly confused, follows commands well, actively participates        Exercises      General Comments        Pertinent Vitals/Pain Pain Assessment Pain Assessment: No/denies pain    Home Living                          Prior Function            PT Goals (current goals can now be found in the care plan section) Progress towards PT goals: Progressing toward goals    Frequency    Min 2X/week      PT Plan Current plan remains appropriate    Co-evaluation              AM-PAC PT "6 Clicks" Mobility  Outcome Measure  Help needed turning from your back to your side while in a flat bed without using bedrails?: None Help needed moving from lying on your back to sitting on the side of a flat bed without using bedrails?: A Little Help needed moving to and from a bed to a chair (including a wheelchair)?: A Little Help needed standing up from a chair using your arms (e.g., wheelchair or bedside chair)?: A Little Help needed to walk in hospital room?: A Little Help needed climbing 3-5 steps with a railing? : A Lot 6 Click Score: 18    End of Session Equipment Utilized During Treatment: Gait belt Activity Tolerance: Patient tolerated treatment well Patient left: in chair;with call bell/phone within reach;with chair alarm set   PT Visit Diagnosis: Other abnormalities of gait and mobility (R26.89);Muscle weakness (generalized) (M62.81)      Time: 9390-3009 PT Time Calculation (min) (ACUTE ONLY): 14 min  Charges:  $Gait Training: 8-22 mins                    Jannette Spanner PT, DPT Physical Therapist Acute Rehabilitation Services Preferred contact method: Secure Chat Weekend Pager Only: [phone] Office: [place] 07/18/2022, 3:20 PM

## 2022-07-18 NOTE — Telephone Encounter (Signed)
3rd call attempt no answer, removing from triage pool

## 2022-07-19 ENCOUNTER — Ambulatory Visit (HOSPITAL_BASED_OUTPATIENT_CLINIC_OR_DEPARTMENT_OTHER): Payer: Medicare Other | Admitting: Nurse Practitioner

## 2022-07-19 DIAGNOSIS — N178 Other acute kidney failure: Secondary | ICD-10-CM

## 2022-07-19 DIAGNOSIS — E876 Hypokalemia: Secondary | ICD-10-CM

## 2022-07-19 DIAGNOSIS — I428 Other cardiomyopathies: Secondary | ICD-10-CM

## 2022-07-19 DIAGNOSIS — G9341 Metabolic encephalopathy: Secondary | ICD-10-CM

## 2022-07-19 DIAGNOSIS — E1122 Type 2 diabetes mellitus with diabetic chronic kidney disease: Secondary | ICD-10-CM

## 2022-07-19 DIAGNOSIS — I5022 Chronic systolic (congestive) heart failure: Secondary | ICD-10-CM

## 2022-07-19 DIAGNOSIS — Z8572 Personal history of non-Hodgkin lymphomas: Secondary | ICD-10-CM

## 2022-07-19 DIAGNOSIS — N189 Chronic kidney disease, unspecified: Secondary | ICD-10-CM

## 2022-07-19 DIAGNOSIS — F0394 Unspecified dementia, unspecified severity, with anxiety: Secondary | ICD-10-CM

## 2022-07-19 DIAGNOSIS — R609 Edema, unspecified: Secondary | ICD-10-CM | POA: Diagnosis not present

## 2022-07-19 DIAGNOSIS — E785 Hyperlipidemia, unspecified: Secondary | ICD-10-CM

## 2022-07-19 DIAGNOSIS — I130 Hypertensive heart and chronic kidney disease with heart failure and stage 1 through stage 4 chronic kidney disease, or unspecified chronic kidney disease: Secondary | ICD-10-CM

## 2022-07-19 DIAGNOSIS — Z9181 History of falling: Secondary | ICD-10-CM

## 2022-07-19 DIAGNOSIS — F0393 Unspecified dementia, unspecified severity, with mood disturbance: Secondary | ICD-10-CM

## 2022-07-19 DIAGNOSIS — U071 COVID-19: Secondary | ICD-10-CM | POA: Diagnosis not present

## 2022-07-19 DIAGNOSIS — I509 Heart failure, unspecified: Secondary | ICD-10-CM | POA: Diagnosis not present

## 2022-07-19 DIAGNOSIS — F32A Depression, unspecified: Secondary | ICD-10-CM

## 2022-07-19 DIAGNOSIS — Z7901 Long term (current) use of anticoagulants: Secondary | ICD-10-CM

## 2022-07-19 DIAGNOSIS — Z79891 Long term (current) use of opiate analgesic: Secondary | ICD-10-CM

## 2022-07-19 DIAGNOSIS — I480 Paroxysmal atrial fibrillation: Secondary | ICD-10-CM

## 2022-07-19 DIAGNOSIS — E0590 Thyrotoxicosis, unspecified without thyrotoxic crisis or storm: Secondary | ICD-10-CM

## 2022-07-19 LAB — PHOSPHORUS: Phosphorus: 3.2 mg/dL (ref 2.5–4.6)

## 2022-07-19 LAB — C-REACTIVE PROTEIN: CRP: 0.5 mg/dL (ref <1.0)

## 2022-07-19 LAB — CBC WITH DIFFERENTIAL/PLATELET
Abs Immature Granulocytes: 0.02 10*3/uL (ref 0.00–0.07)
Basophils Absolute: 0 10*3/uL (ref 0.0–0.1)
Basophils Relative: 0 %
Eosinophils Absolute: 0 10*3/uL (ref 0.0–0.5)
Eosinophils Relative: 0 %
HCT: 37 % — ABNORMAL LOW (ref 39.0–52.0)
Hemoglobin: 11.6 g/dL — ABNORMAL LOW (ref 13.0–17.0)
Immature Granulocytes: 0 %
Lymphocytes Relative: 12 %
Lymphs Abs: 1.1 10*3/uL (ref 0.7–4.0)
MCH: 28.2 pg (ref 26.0–34.0)
MCHC: 31.4 g/dL (ref 30.0–36.0)
MCV: 90 fL (ref 80.0–100.0)
Monocytes Absolute: 0.8 10*3/uL (ref 0.1–1.0)
Monocytes Relative: 9 %
Neutro Abs: 7 10*3/uL (ref 1.7–7.7)
Neutrophils Relative %: 79 %
Platelets: 236 10*3/uL (ref 150–400)
RBC: 4.11 MIL/uL — ABNORMAL LOW (ref 4.22–5.81)
RDW: 16 % — ABNORMAL HIGH (ref 11.5–15.5)
WBC: 9 10*3/uL (ref 4.0–10.5)
nRBC: 0 % (ref 0.0–0.2)

## 2022-07-19 LAB — MAGNESIUM: Magnesium: 2 mg/dL (ref 1.7–2.4)

## 2022-07-19 LAB — HEPATITIS PANEL, ACUTE: Hep A IgM: NEGATIVE — AB

## 2022-07-19 LAB — COMPREHENSIVE METABOLIC PANEL
ALT: 19 U/L (ref 0–44)
AST: 17 U/L (ref 15–41)
Albumin: 2.8 g/dL — ABNORMAL LOW (ref 3.5–5.0)
Alkaline Phosphatase: 58 U/L (ref 38–126)
Anion gap: 3 — ABNORMAL LOW (ref 5–15)
BUN: 43 mg/dL — ABNORMAL HIGH (ref 8–23)
CO2: 30 mmol/L (ref 22–32)
Calcium: 8.4 mg/dL — ABNORMAL LOW (ref 8.9–10.3)
Chloride: 107 mmol/L (ref 98–111)
Creatinine, Ser: 1.01 mg/dL (ref 0.61–1.24)
GFR, Estimated: >60 mL/min (ref >60)
Glucose, Bld: 95 mg/dL (ref 70–99)
Potassium: 4.1 mmol/L (ref 3.5–5.1)
Sodium: 140 mmol/L (ref 135–145)
Total Bilirubin: 0.8 mg/dL (ref 0.3–1.2)
Total Protein: 5.5 g/dL — ABNORMAL LOW (ref 6.5–8.1)

## 2022-07-19 LAB — GLUCOSE, CAPILLARY
Glucose-Capillary: 104 mg/dL — ABNORMAL HIGH (ref 70–99)
Glucose-Capillary: 123 mg/dL — ABNORMAL HIGH (ref 70–99)

## 2022-07-19 LAB — FERRITIN: Ferritin: 141 ng/mL (ref 24–336)

## 2022-07-19 LAB — LACTATE DEHYDROGENASE: LDH: 158 U/L (ref 98–192)

## 2022-07-19 LAB — D-DIMER, QUANTITATIVE: D-Dimer, Quant: 0.79 ug/mL-FEU — ABNORMAL HIGH (ref 0.00–0.50)

## 2022-07-19 MED ORDER — GUAIFENESIN ER 600 MG PO TB12: 600.0000 mg | ORAL_TABLET | Freq: Two times a day (BID) | ORAL | 0 refills | Status: AC

## 2022-07-19 MED ORDER — ONDANSETRON HCL 4 MG PO TABS: 4.0000 mg | ORAL_TABLET | Freq: Four times a day (QID) | ORAL | 0 refills | Status: AC | PRN

## 2022-07-19 MED ORDER — OLANZAPINE 5 MG PO TABS: 5.0000 mg | ORAL_TABLET | Freq: Two times a day (BID) | ORAL | 0 refills | Status: DC

## 2022-07-19 MED ORDER — SENNA 8.6 MG PO TABS: 1.0000 | ORAL_TABLET | Freq: Two times a day (BID) | ORAL | 0 refills | Status: AC

## 2022-07-19 MED ORDER — POLYETHYLENE GLYCOL 3350 17 G PO PACK: 17.0000 g | PACK | Freq: Every day | ORAL | 0 refills | Status: AC | PRN

## 2022-07-19 MED ORDER — JUVEN PO PACK: 1.0000 | PACK | Freq: Two times a day (BID) | ORAL | 0 refills | Status: AC

## 2022-07-19 MED ORDER — PANTOPRAZOLE SODIUM 40 MG PO TBEC: 40.0000 mg | DELAYED_RELEASE_TABLET | Freq: Every day | ORAL | 0 refills | Status: DC

## 2022-07-19 MED ORDER — ZINC SULFATE 220 (50 ZN) MG PO CAPS: 220.0000 mg | ORAL_CAPSULE | Freq: Every day | ORAL | 0 refills | Status: AC

## 2022-07-19 MED ORDER — ENSURE MAX PROTEIN PO LIQD: 11.0000 [oz_av] | Freq: Every day | ORAL | 0 refills | Status: AC

## 2022-07-19 MED ORDER — MELATONIN 5 MG PO TABS: 5.0000 mg | ORAL_TABLET | Freq: Every day | ORAL | 0 refills | Status: AC

## 2022-07-19 MED ORDER — ASCORBIC ACID 500 MG PO TABS: 500.0000 mg | ORAL_TABLET | Freq: Every day | ORAL | 0 refills | Status: AC

## 2022-07-19 MED ORDER — ZINC OXIDE 12.8 % EX OINT: TOPICAL_OINTMENT | Freq: Two times a day (BID) | CUTANEOUS | 0 refills | Status: AC

## 2022-07-19 NOTE — Care Management Important Message (Signed)
Important Message  Patient Details IM Letter given Name: [name] MRN: [id number] Date of Birth: 23-May-1946   Medicare Important Message Given:  Yes     Kerin Salen 07/19/2022, 11:27 AM

## 2022-07-19 NOTE — TOC Transition Note (Signed)
Transition of Care [facility]) - CM/SW Discharge Note   Patient Details  Name: [name] MRN: [id number] Date of Birth: 17-Jun-1946  [facility]) CM/SW Contact:  Dessa Phi, RN Phone Number: 07/19/2022, 12:18 PM   Clinical Narrative:  Spoke to spouse about d/c plan home w/HHPT/OT-agree. [facility] services. No further CM needs.     Final next level of care: [place] Barriers to Discharge: No Barriers Identified   Patient Goals and CMS Choice Patient states their goals for this hospitalization and ongoing recovery are:: snf for STR CMS Medicare.gov Compare Post Acute Care list provided to:: Patient Represenative (must comment) (Daughter, Mickel Baas) Choice offered to / list presented to : Adult Children  Discharge Placement                       Discharge Plan and Services                          HH Arranged: OT, PT [place] Agency: [place]        Social Determinants of Health (SDOH) Interventions     Readmission Risk Interventions    07/11/2022   11:48 AM 05/23/2022   12:29 PM 05/12/2022    2:43 PM  Readmission Risk Prevention Plan  Transportation Screening Complete Complete Complete  PCP or Specialist Appt within 3-5 Days  Complete   HRI or [place]  Complete Complete  Social Work Consult for [place] Planning/Counseling  Complete Complete  Palliative Care Screening  Not Applicable Not Applicable  Medication Review Press photographer) Complete Complete   PCP or Specialist appointment within 3-5 days of discharge Complete    HRI or [place] Complete    SW Recovery Care/Counseling Consult Complete    Palliative Care Screening Not [place] Complete

## 2022-07-19 NOTE — Progress Notes (Signed)
Mobility Specialist - Progress Note   07/19/22 0939  Mobility  Activity Ambulated with assistance in hallway  Activity Response Tolerated well  Distance Ambulated (ft) 200 ft  $Mobility charge 1 Mobility  Level of Assistance Contact guard assist, steadying assist  Assistive Device Front wheel walker  Range of Motion/Exercises Active   Pt was found in bed and agreeable to ambulate. Pt legs did become fatigued and shaky towards the end. At EOS returned to recliner chair with NT in room.  Ferd Hibbs Mobility Specialist

## 2022-07-19 NOTE — Discharge Summary (Signed)
Physician Discharge Summary  [name] WVP:[id number] DOB: June 22, 1946 DOA: 07/07/2022  PCP: [name], MD  Admit date: 07/07/2022 Discharge date: 07/19/2022  Time spent: 35 minutes  Recommendations for Outpatient Follow-up:    Acute on chronic systolic and diastolic CHF/Nonischemic cardiomyopathy -Echo from July 2023 showed EF of 25 to 30% with global hypokinesis, grade 2 diastolic dysfunction -Strict in and out -8.0 L - Daily weight Filed Weights   07/16/22 0609 07/18/22 0500 07/19/22 0300  Weight: 102.7 kg 106.5 kg 106.6 kg     Paroxysmal atrial fibrillation -Apixaban 5 mg BID -Toprol 25 mg daily  Essential hypertension -Presented with shortness of breath, 1-2+ bilateral pedal edema -BNP was significantly elevated over 4000 -Defer to cardiology as an outpatient for initiation of any ARB or Entresto.   Acute respiratory failure with hypoxia/COVID pneumonia -Presented with worsening symptoms of COVID-19 infection -COVID test: Positive at home on 9/19, positive in ED on admission on 9/21 -Initial chest x-ray on admission showed pulmonary edema but it could be a mix of COVID-19 infiltrates as well. COVID-19 Labs  Recent Labs    07/17/22 0555 07/18/22 0437 07/19/22 0443  DDIMER 1.29* 0.79* 0.79*  FERRITIN 167 144 141  LDH 125 142 158  CRP <0.5 <0.5 0.5    Lab Results  Component Value Date   SARSCOV2NAA POSITIVE (A) 07/07/2022   [place] NEGATIVE 05/06/2022   SARSCOV2NAA NOT DETECTED 06/25/2019   [place] NEGATIVE 04/07/2019  -Initially IV Solu-Medrol was started as well which has now been switched to oral prednisone.  -Plan for 10-day course to complete on 9/30. -Continue antitussives, as needed inhalers -Encourage ambulation -Completed the course of Molnupiravir -Zinc 220 mg daily - Vitamin C 500 mg daily -Completed 10-day course steroids on 9/30 - Flutter valve - Incentive spirometry -DuoNeb QID - 10/3 resolved  OSA -Nightly CPAP    AKI BUN/creatinine elevated 29/1.29 yesterday because of overdiuresis.  IV Lasix was held with significant improvement in creatinine.  Oral Lasix has been resumed.   Lab Results  Component Value Date   CREATININE 1.01 07/19/2022   CREATININE 1.11 07/18/2022   CREATININE 1.04 07/17/2022   CREATININE 1.06 07/16/2022   CREATININE 1.16 07/15/2022  -Resolved   Acute metabolic encephalopathy/Dementia without behavioral disturbance Anxiety/depression PTA on Zyprexa 2.5 mg twice daily, Ativan 0.5 mg 3 times daily as needed, Prozac 40 mg daily Despite those medicines, patient was having nightly episodes of restlessness. -9/25, Zyprexa dose was doubled to 5 mg BID -9/25 Melatonin nightly was added.   -mental status and nightly sleepiness have improved gradually.    Amiodarone-induced hypERthyroidism -Recently hospitalized from 7/21 to 8/7 for altered mental status due to amiodarone-induced.  -miodarone was stopped at the time and patient was started on methimazole.   -TSH and free T4 level both seem to be improving but still not in target range. Continue methimazole. -follow-up with endocrinology as an outpatient     Type 2 diabetes mellitus -9/21 hemoglobin A1c = 5.7 -PTA on metformin 500 mg twice daily.  Not on insulin Currently metformin is on hold.  If renal function remains stable, can resume at discharge.      Hyperlipidemia Continue statin   BPH Continue Flomax   Morbid obesity  -Body mass index is 29.18 kg/m. Patient has been advised to make an attempt to improve diet and exercise patterns to aid in weight loss.     History of Non-Hodgkin's lymphoma.   Impaired mobility -At home, patient at baseline is able to  ambulate with a walker -PT recommended.  SNF.  -Improved  Stage II sacral decubitus ulcer -Wound care consult appreciated   Hypokalemia - Potassium goal> 4 - 9/28 K-Dur 40 mEq x 2       Discharge Diagnoses:  Principal Problem:   CHF exacerbation  ([place]) Active Problems:   Pneumonia due to COVID-19 virus   Essential hypertension   NICM (nonischemic cardiomyopathy) (HCC)   DM2 (diabetes mellitus, type 2) (HCC)   PAF (paroxysmal atrial fibrillation) (HCC)   Chronic edema   Chronic anticoagulation   Dementia with behavioral disturbance (HCC)   Pressure injury of skin   Dementia without behavioral disturbance ([place])   Discharge Condition: Stable  Diet recommendation: Low-salt diet  Filed Weights   07/16/22 0609 07/18/22 0500 07/19/22 0300  Weight: 102.7 kg 106.5 kg 106.6 kg    History of present illness:  76 y.o. WM PMHx  mild dementia, obesity, OSA, DM type 2, HTN, HLD, PAF on Eliquis, nonischemic cardiomyopathy, chronic systolic CHF (EF 25 to 03% in July 2023), non-Hodgkin's lymphoma.   Presented to the ED with complaint of progressive COVID symptoms. 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 Patient was discharged to SNF where he stayed for 2 weeks and was discharged home.  Per his wife, at home, patient was able to walk with a walker.   From 9/18, patient started having fever, cough, shortness of breath, fatigue.  He tested positive for COVID test done at home on 9/19.  Wife called her PCP on 9/20 and was prescribed molnupiravir which she received 1 dose of so far.  Patient became more lethargic and hence EMS was called and he was brought to ED on 9/21   In the ED, patient was afebrile, and hemodynamically stable but required 4 L oxygen by nasal cannula.   BNP was significantly elevated to 4248. COVID PCR positive Chest x-ray showed pulm edema.  Given IV Lasix Admitted to hospitalist service for CHF exacerbation and COVID infection Clinically improved. Currently pending placement, needs to wait till the end isolation per COVID is over on 10/3.  Hospital Course:  See above   Cultures  9/28 respiratory virus panel negative     Discharge Exam: Vitals:    07/18/22 1328 07/18/22 2103 07/19/22 0300 07/19/22 0401  BP: (!) 98/56 113/64  (!) 111/58  Pulse: 64 61  70  Resp: '20 17  20  '$ Temp: 97.6 F (36.4 C) 97.9 F (36.6 C)  97.7 F (36.5 C)  TempSrc: Oral Oral  Oral  SpO2: 99% 100%  97%  Weight:   106.6 kg   Height:        General: A/O x1 (does not know where, when, why).No acute respiratory distress Lungs: Clear to auscultation bilaterally without wheezes or crackles Cardiovascular: Regular rate and rhythm without murmur gallop or rub normal S1 and S2  Discharge Instructions   Allergies as of 07/19/2022       Reactions   Amiodarone Other (See Comments)   Thyroid toxicity        Medication List     STOP taking these medications    molnupiravir EUA 200 mg Caps capsule Commonly known as: LAGEVRIO   spironolactone 25 MG tablet Commonly known as: ALDACTONE       TAKE these medications    acetaminophen 325 MG tablet Commonly known as: TYLENOL Take 650 mg by mouth 2 (two) times daily as needed (pain).   ascorbic acid 500 MG  tablet Commonly known as: VITAMIN C Take 1 tablet (500 mg total) by mouth daily. Start taking on: July 20, 2022   atorvastatin 20 MG tablet Commonly known as: LIPITOR TAKE 1 TABLET BY MOUTH EVERY DAY   Eliquis 5 MG Tabs tablet Generic drug: apixaban TAKE 1 TABLET BY MOUTH TWICE A DAY What changed: how much to take   FLUoxetine 40 MG capsule Commonly known as: PROZAC TAKE 1 CAPSULE BY MOUTH EVERY DAY What changed: how much to take   furosemide 40 MG tablet Commonly known as: LASIX TAKE 1 TABLET BY MOUTH EVERY DAY What changed: when to take this   guaiFENesin 600 MG 12 hr tablet Commonly known as: MUCINEX Take 1 tablet (600 mg total) by mouth 2 (two) times daily.   LORazepam 0.5 MG tablet Commonly known as: ATIVAN Take 1 tablet (0.5 mg total) by mouth every 8 (eight) hours as needed for anxiety. What changed: reasons to take this   melatonin 5 MG Tabs Take 1 tablet (5 mg  total) by mouth at bedtime.   metFORMIN 500 MG tablet Commonly known as: GLUCOPHAGE TAKE 1 TABLET BY MOUTH TWICE DAILY WITH A MEAL   methimazole 10 MG tablet Commonly known as: TAPAZOLE Take 1 tablet (10 mg total) by mouth 3 (three) times daily.   metoprolol succinate 25 MG 24 hr tablet Commonly known as: TOPROL-XL Take 1 tablet (25 mg total) by mouth daily.   Multivitamin Men 50+ Tabs Take 1 tablet by mouth daily with breakfast.   nutrition supplement (JUVEN) Pack Take 1 packet by mouth 2 (two) times daily between meals.   Ensure Max Protein Liqd Take 330 mLs (11 oz total) by mouth at bedtime.   OLANZapine 5 MG tablet Commonly known as: ZYPREXA Take 1 tablet (5 mg total) by mouth 2 (two) times daily. What changed:  medication strength how much to take how to take this when to take this additional instructions   ondansetron 4 MG tablet Commonly known as: ZOFRAN Take 1 tablet (4 mg total) by mouth every 6 (six) hours as needed for nausea.   pantoprazole 40 MG tablet Commonly known as: PROTONIX Take 1 tablet (40 mg total) by mouth at bedtime.   polyethylene glycol 17 g packet Commonly known as: MIRALAX / GLYCOLAX Take 17 g by mouth daily as needed for mild constipation.   Sarna Sensitive 1 % Lotn Generic drug: pramoxine Apply 1 Application topically 2 (two) times daily as needed (for itching).   senna 8.6 MG Tabs tablet Commonly known as: SENOKOT Take 1 tablet (8.6 mg total) by mouth 2 (two) times daily.   tamsulosin 0.4 MG Caps capsule Commonly known as: FLOMAX Take 1 capsule (0.4 mg total) by mouth daily.   Zinc Oxide 12.8 % ointment Commonly known as: TRIPLE PASTE Apply topically 2 (two) times daily.   zinc sulfate 220 (50 Zn) MG capsule Take 1 capsule (220 mg total) by mouth daily. Start taking on: July 20, 2022       Allergies  Allergen Reactions   Amiodarone Other (See Comments)    Thyroid toxicity    Contact information for follow-up  providers     [name], MD Follow up.   Specialty: Family Medicine Contact information: [place] [address] [phone]         Buford Dresser, MD .   Specialty: Cardiology Contact information: [address] [place] [address] [phone]  Contact information for after-discharge care     Destination     [facility] .   Service: Skilled Nursing Contact information: [place] [phone]                      The results of significant diagnostics from this hospitalization (including imaging, microbiology, ancillary and laboratory) are listed below for reference.    Significant Diagnostic Studies: DG Chest Port 1 View  Result Date: 07/07/2022 CLINICAL DATA:  Shortness of breath EXAM: PORTABLE CHEST 1 VIEW COMPARISON:  05/19/2022 FINDINGS: Cardiomegaly. Aortic atherosclerosis. Pulmonary vascular congestion. Diffuse interstitial and alveolar opacities, most pronounced within the right lower lobe. Probable small bilateral pleural effusions. No pneumothorax. IMPRESSION: Cardiomegaly with pulmonary vascular congestion. Diffuse interstitial and alveolar opacities, most pronounced within the right lower lobe, likely pulmonary edema. Superimposed infection would be difficult to exclude in the appropriate clinical setting. Electronically Signed   By: Davina Poke D.O.   On: 07/07/2022 14:21    Microbiology: Recent Results (from the past 240 hour(s))  Respiratory (~20 pathogens) panel by PCR     Status: None   Collection Time: 07/14/22  5:38 PM   Specimen: Nasopharyngeal Swab; Respiratory  Result Value Ref Range Status   Adenovirus NOT DETECTED NOT DETECTED Final   Coronavirus 229E NOT DETECTED NOT DETECTED Final    Comment: (NOTE) The Coronavirus on the Respiratory Panel, DOES NOT test for the novel  Coronavirus (2019 nCoV)     Coronavirus HKU1 NOT DETECTED NOT DETECTED Final   Coronavirus NL63 NOT DETECTED NOT DETECTED Final   Coronavirus OC43 NOT DETECTED NOT DETECTED Final   Metapneumovirus NOT DETECTED NOT DETECTED Final   Rhinovirus / Enterovirus NOT DETECTED NOT DETECTED Final   Influenza A NOT DETECTED NOT DETECTED Final   Influenza B NOT DETECTED NOT DETECTED Final   Parainfluenza Virus 1 NOT DETECTED NOT DETECTED Final   Parainfluenza Virus 2 NOT DETECTED NOT DETECTED Final   Parainfluenza Virus 3 NOT DETECTED NOT DETECTED Final   Parainfluenza Virus 4 NOT DETECTED NOT DETECTED Final   Respiratory Syncytial Virus NOT DETECTED NOT DETECTED Final   Bordetella pertussis NOT DETECTED NOT DETECTED Final   Bordetella Parapertussis NOT DETECTED NOT DETECTED Final   Chlamydophila pneumoniae NOT DETECTED NOT DETECTED Final   Mycoplasma pneumoniae NOT DETECTED NOT DETECTED Final    Comment: Performed at [facility], [place]. [address], [place], [place] [address]     Labs: Basic Metabolic Panel: Recent Labs  Lab 07/15/22 0535 07/16/22 0603 07/17/22 0555 07/18/22 0437 07/19/22 0443  NA 139 141 138 140 140  K 3.5 3.5 3.5 4.5 4.1  CL 103 104 105 106 107  CO2 '28 30 29 29 30  '$ GLUCOSE 84 78 95 104* 95  BUN 32* 34* 40* 38* 43*  CREATININE 1.16 1.06 1.04 1.11 1.01  CALCIUM 8.6* 8.7* 8.5* 8.5* 8.4*  MG 2.0 2.2 2.1 2.2 2.0  PHOS 3.3 3.7 3.0 3.1 3.2   Liver Function Tests: Recent Labs  Lab 07/15/22 0535 07/16/22 0603 07/17/22 0555 07/18/22 0437 07/19/22 0443  AST '17 18 15 16 17  '$ ALT '18 19 19 19 19  '$ ALKPHOS 61 61 55 55 58  BILITOT 1.0 0.9 0.7 0.8 0.8  PROT 5.9* 5.9* 5.5* 5.4* 5.5*  ALBUMIN 2.9* 3.0* 2.8* 2.7* 2.8*   No results for input(s): "LIPASE", "AMYLASE" in the last 168 hours. No results for input(s): "AMMONIA" in the last  168 hours. CBC: Recent Labs  Lab 07/15/22 0535 07/16/22 0603 07/17/22 0555 07/18/22 0437 07/19/22 0443  WBC 7.8 8.2 8.8 10.1 9.0  NEUTROABS 5.9 6.5 7.2 8.5* 7.0   HGB 13.3 12.9* 11.6* 11.9* 11.6*  HCT 43.0 41.5 36.9* 38.1* 37.0*  MCV 90.1 90.2 90.0 90.7 90.0  PLT 337 281 254 254 236   Cardiac Enzymes: No results for input(s): "CKTOTAL", "CKMB", "CKMBINDEX", "TROPONINI" in the last 168 hours. BNP: BNP (last 3 results) Recent Labs    07/08/22 0507 07/09/22 0505 07/10/22 0459  BNP 4,448.2* 2,417.7* 1,929.5*    ProBNP (last 3 results) No results for input(s): "PROBNP" in the last 8760 hours.  CBG: Recent Labs  Lab 07/18/22 1131 07/18/22 1613 07/18/22 1947 07/19/22 0737 07/19/22 1113  GLUCAP 97 105* 122* 104* 123*       Signed:  Dia Crawford, MD [facility]

## 2022-07-20 ENCOUNTER — Telehealth: Payer: Self-pay

## 2022-07-20 NOTE — Telephone Encounter (Signed)
Transition Care Management Follow-up Telephone Call Date of discharge and from where: 07/19/22 from [facility] for heart failure How have you been since you were released from the hospital? Wife states he's doing better Any questions or concerns? No  Items Reviewed: Did the pt receive and understand the discharge instructions provided? Yes  Medications obtained and verified? Yes  Other? No  Any new allergies since your discharge? No  Dietary orders reviewed? Yes Do you have support at home? Yes   Home Care and Equipment/Supplies: Were home health services ordered? yes If so, what is the name of the agency? [facility]  Has the agency set up a time to come to the patient's home? no Were any new equipment or medical supplies ordered?  No   Functional Questionnaire: (I = Independent and D = Dependent) ADLs: I  Bathing/Dressing- I  Meal Prep- D  Eating- I  Maintaining continence- I  Transferring/Ambulation- I  Managing Meds- D  Follow up appointments reviewed:  PCP Hospital f/u appt confirmed? Yes  Scheduled to see Dr [name] on 07/22/22 @ 2:15pm. [facility] f/u appt confirmed? No  Scheduled; informed wife that cardiology was trying to reach them to schedule HFU. Pt states she will call them & she has their number. Are transportation arrangements needed? No  If their condition worsens, is the pt aware to call PCP or go to the Emergency Dept.? Yes Was the patient provided with contact information for the PCP's office or ED? Yes Was to pt encouraged to call back with questions or concerns? Yes

## 2022-07-21 ENCOUNTER — Telehealth: Payer: Self-pay | Admitting: Family Medicine

## 2022-07-21 NOTE — Telephone Encounter (Signed)
Zacharia from [facility] call and stated pt Occupational Therapy 1 X a wk for 4 wk's and and ADL and IADL also Therapeutic exercise and Therapeutic activity and functional transfer to the shower and toilet also PT 1 X a wk for 1 wk.

## 2022-07-21 NOTE — Telephone Encounter (Signed)
Home health PT 1x1 2x3weeks with possibility of recert

## 2022-07-22 ENCOUNTER — Ambulatory Visit (INDEPENDENT_AMBULATORY_CARE_PROVIDER_SITE_OTHER): Payer: Medicare Other | Admitting: Family Medicine

## 2022-07-22 ENCOUNTER — Encounter: Payer: Self-pay | Admitting: Family Medicine

## 2022-07-22 VITALS — BP 120/80 | HR 61 | Temp 97.7°F | Resp 18 | Ht 75.0 in | Wt 242.0 lb

## 2022-07-22 DIAGNOSIS — I10 Essential (primary) hypertension: Secondary | ICD-10-CM

## 2022-07-22 DIAGNOSIS — R410 Disorientation, unspecified: Secondary | ICD-10-CM | POA: Diagnosis not present

## 2022-07-22 DIAGNOSIS — Z23 Encounter for immunization: Secondary | ICD-10-CM

## 2022-07-22 DIAGNOSIS — U071 COVID-19: Secondary | ICD-10-CM

## 2022-07-22 DIAGNOSIS — F0390 Unspecified dementia without behavioral disturbance: Secondary | ICD-10-CM

## 2022-07-22 DIAGNOSIS — G9341 Metabolic encephalopathy: Secondary | ICD-10-CM | POA: Diagnosis not present

## 2022-07-22 DIAGNOSIS — I5021 Acute systolic (congestive) heart failure: Secondary | ICD-10-CM

## 2022-07-22 DIAGNOSIS — I480 Paroxysmal atrial fibrillation: Secondary | ICD-10-CM

## 2022-07-22 DIAGNOSIS — N1831 Chronic kidney disease, stage 3a: Secondary | ICD-10-CM

## 2022-07-22 DIAGNOSIS — E1122 Type 2 diabetes mellitus with diabetic chronic kidney disease: Secondary | ICD-10-CM

## 2022-07-22 MED ORDER — METFORMIN HCL 500 MG PO TABS: 500.0000 mg | ORAL_TABLET | Freq: Two times a day (BID) | ORAL | 3 refills | Status: AC

## 2022-07-22 MED ORDER — OLANZAPINE 5 MG PO TABS: 5.0000 mg | ORAL_TABLET | Freq: Two times a day (BID) | ORAL | 3 refills | Status: AC

## 2022-07-22 MED ORDER — METOPROLOL SUCCINATE ER 25 MG PO TB24: 25.0000 mg | ORAL_TABLET | Freq: Every day | ORAL | 3 refills | Status: AC

## 2022-07-22 MED ORDER — PANTOPRAZOLE SODIUM 40 MG PO TBEC: 40.0000 mg | DELAYED_RELEASE_TABLET | Freq: Every day | ORAL | 3 refills | Status: AC

## 2022-07-22 NOTE — Telephone Encounter (Signed)
Please okay these orders  ?

## 2022-07-22 NOTE — Telephone Encounter (Signed)
Spoke with Lli[facility] advised that verbal orders requested have been approved

## 2022-07-22 NOTE — Progress Notes (Signed)
   Subjective:    Patient ID: [name], male    DOB: [birth year], 76 y.o.   MRN: [id number]  HPI Here with his wife to follow up a hospital stay from 07-07-22 to 07-19-22 for the effects of a Covid-19 infection. He presented with cough, SOB, swollen feet, and increased confusion. His BP was stable but he was fluid overloaded. His BNP was >4000. He was found to have a viral pneumonia. His renal function stayed normal, with a DC creatinine of 1.10 and a GFR of >60. His Hgb was a bit low at 11.6, but I think some of this was due to a dilutional effect. His diabetes has been well controlled, as his A1c was 5.7%. he has dementia at baseline, but the infection caused an encephalopathy on top of that. He had been on Zyprexa 2.5 mg BID, but this was increased to 5 mg BID. Now that he is home, he feels much better. No SOB. Appetite is good. His moods are stable. His wife agrees that he is back to baseline, and his confusion has settled down. She wants to keep him on this new dose of Zyprexa. He is having home health nurses come to the house, along with PT and OT;    Review of Systems  Constitutional: Negative.   Respiratory: Negative.    Cardiovascular: Negative.   Gastrointestinal: Negative.   Genitourinary: Negative.   Neurological:  Negative for dizziness, light-headedness and headaches.  Psychiatric/Behavioral:  Positive for confusion and hallucinations. Negative for agitation, behavioral problems, dysphoric mood, self-injury, sleep disturbance and suicidal ideas. The patient is not nervous/anxious.        Objective:   Physical Exam Constitutional:      Appearance: Normal appearance.     Comments: Walks with a cane   Cardiovascular:     Rate and Rhythm: Normal rate and regular rhythm.     Pulses: Normal pulses.     Heart sounds: Normal heart sounds.  Pulmonary:     Effort: Pulmonary effort is normal.     Breath sounds: Normal breath sounds.  Abdominal:     General: Abdomen is flat.  Bowel sounds are normal. There is no distension.     Palpations: Abdomen is soft. There is no mass.     Tenderness: There is no abdominal tenderness. There is no guarding or rebound.     Hernia: No hernia is present.  Musculoskeletal:     Comments: 1+ ankle edema   Neurological:     Mental Status: He is alert. Mental status is at baseline.  Psychiatric:        Mood and Affect: Mood normal.        Behavior: Behavior normal.     Comments: He is laughing and joking as usual            Assessment & Plan:  He is recovering from a Covid-19 infection which caused a pneumonia, acute on chronic CHF, and metabolic encephalopathy. He seems to be back to baseline. We will see him again in 2 months to check labs. We will keep his current medications the same. We spent a total of ( 35  ) minutes reviewing records and discussing these issues.  Alysia Penna, MD

## 2022-07-25 NOTE — Telephone Encounter (Signed)
[name] is calling checking on VO

## 2022-07-26 NOTE — Telephone Encounter (Signed)
Spoke with [employer] okay for orders given.

## 2022-07-26 NOTE — Telephone Encounter (Signed)
[name] is caling to check progress of these orders

## 2022-07-27 NOTE — Telephone Encounter (Signed)
Noted  

## 2022-08-03 ENCOUNTER — Telehealth: Payer: Self-pay | Admitting: Family Medicine

## 2022-08-03 NOTE — Telephone Encounter (Signed)
Left message for patient to call back and schedule Medicare Annual Wellness Visit (AWV) either virtually or in office. Left  my Herbie Drape number [phone]   Last AWV 04/28/21 please schedule with Nurse Health Adviser   45 min for awv-i and in office appointments 30 min for awv-s  phone/virtual appointments

## 2022-08-11 ENCOUNTER — Telehealth: Payer: Self-pay | Admitting: Family Medicine

## 2022-08-11 DIAGNOSIS — E0590 Thyrotoxicosis, unspecified without thyrotoxic crisis or storm: Secondary | ICD-10-CM

## 2022-08-11 NOTE — Telephone Encounter (Signed)
Pt wife Ulis Rias is calling and pt saw dr on 07-22-2022 and they forgot to mention when he was in rehab they recommended him to see endocrinologist due to tsh

## 2022-08-12 NOTE — Telephone Encounter (Signed)
Called wife Ulis Rias, message given Endo referral

## 2022-08-12 NOTE — Telephone Encounter (Signed)
Left a message for pt wife to call the office regarding this message

## 2022-08-12 NOTE — Telephone Encounter (Signed)
I agree. He is hyperthyroid. I did the referral (urgent) to Endocrinology

## 2022-08-12 NOTE — Telephone Encounter (Signed)
Pt wife is returning nancy call

## 2022-08-14 ENCOUNTER — Other Ambulatory Visit: Payer: Self-pay | Admitting: Family Medicine

## 2022-08-22 ENCOUNTER — Emergency Department (HOSPITAL_COMMUNITY)
Admission: EM | Admit: 2022-08-22 | Discharge: 2022-08-22 | Disposition: A | Discharge status: Home / Self Care | Payer: Medicare Other | Source: Home / Self Care | Attending: Emergency Medicine | Admitting: Emergency Medicine

## 2022-08-22 ENCOUNTER — Other Ambulatory Visit: Payer: Self-pay

## 2022-08-22 ENCOUNTER — Emergency Department (HOSPITAL_COMMUNITY): Payer: Medicare Other

## 2022-08-22 DIAGNOSIS — Z7901 Long term (current) use of anticoagulants: Secondary | ICD-10-CM | POA: Insufficient documentation

## 2022-08-22 DIAGNOSIS — U071 COVID-19: Secondary | ICD-10-CM | POA: Diagnosis not present

## 2022-08-22 DIAGNOSIS — R7989 Other specified abnormal findings of blood chemistry: Secondary | ICD-10-CM | POA: Insufficient documentation

## 2022-08-22 DIAGNOSIS — Z79899 Other long term (current) drug therapy: Secondary | ICD-10-CM | POA: Insufficient documentation

## 2022-08-22 DIAGNOSIS — D649 Anemia, unspecified: Secondary | ICD-10-CM | POA: Insufficient documentation

## 2022-08-22 DIAGNOSIS — E119 Type 2 diabetes mellitus without complications: Secondary | ICD-10-CM | POA: Insufficient documentation

## 2022-08-22 DIAGNOSIS — I110 Hypertensive heart disease with heart failure: Secondary | ICD-10-CM | POA: Insufficient documentation

## 2022-08-22 DIAGNOSIS — I5043 Acute on chronic combined systolic (congestive) and diastolic (congestive) heart failure: Secondary | ICD-10-CM | POA: Insufficient documentation

## 2022-08-22 DIAGNOSIS — Z7984 Long term (current) use of oral hypoglycemic drugs: Secondary | ICD-10-CM | POA: Insufficient documentation

## 2022-08-22 DIAGNOSIS — R0602 Shortness of breath: Secondary | ICD-10-CM | POA: Diagnosis not present

## 2022-08-22 LAB — CBC WITH DIFFERENTIAL/PLATELET
Abs Immature Granulocytes: 0.02 10*3/uL (ref 0.00–0.07)
Basophils Absolute: 0 10*3/uL (ref 0.0–0.1)
Basophils Relative: 1 %
Eosinophils Absolute: 0 10*3/uL (ref 0.0–0.5)
Eosinophils Relative: 1 %
HCT: 39.1 % (ref 39.0–52.0)
Hemoglobin: 12.2 g/dL — ABNORMAL LOW (ref 13.0–17.0)
Immature Granulocytes: 0 %
Lymphocytes Relative: 12 %
Lymphs Abs: 0.9 10*3/uL (ref 0.7–4.0)
MCH: 29 pg (ref 26.0–34.0)
MCHC: 31.2 g/dL (ref 30.0–36.0)
MCV: 93.1 fL (ref 80.0–100.0)
Monocytes Absolute: 0.7 10*3/uL (ref 0.1–1.0)
Monocytes Relative: 10 %
Neutro Abs: 5.4 10*3/uL (ref 1.7–7.7)
Neutrophils Relative %: 76 %
Platelets: 255 10*3/uL (ref 150–400)
RBC: 4.2 MIL/uL — ABNORMAL LOW (ref 4.22–5.81)
RDW: 17.8 % — ABNORMAL HIGH (ref 11.5–15.5)
WBC: 7.1 10*3/uL (ref 4.0–10.5)
nRBC: 0 % (ref 0.0–0.2)

## 2022-08-22 LAB — BASIC METABOLIC PANEL
Anion gap: 9 (ref 5–15)
BUN: 22 mg/dL (ref 8–23)
CO2: 27 mmol/L (ref 22–32)
Calcium: 8.6 mg/dL — ABNORMAL LOW (ref 8.9–10.3)
Chloride: 106 mmol/L (ref 98–111)
Creatinine, Ser: 1.07 mg/dL (ref 0.61–1.24)
GFR, Estimated: >60 mL/min (ref >60)
Glucose, Bld: 136 mg/dL — ABNORMAL HIGH (ref 70–99)
Potassium: 3.7 mmol/L (ref 3.5–5.1)
Sodium: 142 mmol/L (ref 135–145)

## 2022-08-22 LAB — TROPONIN I (HIGH SENSITIVITY): Troponin I (High Sensitivity): 15 ng/L (ref <18)

## 2022-08-22 LAB — BRAIN NATRIURETIC PEPTIDE: B Natriuretic Peptide: >4500 pg/mL — ABNORMAL HIGH (ref 0.0–100.0)

## 2022-08-22 MED ORDER — FUROSEMIDE 10 MG/ML IJ SOLN
40.0000 mg | Freq: Once | INTRAMUSCULAR | Status: AC
  Administered 2022-08-22: 40 mg via INTRAVENOUS
  Filled 2022-08-22: qty 4

## 2022-08-22 MED ORDER — IPRATROPIUM-ALBUTEROL 0.5-2.5 (3) MG/3ML IN SOLN
3.0000 mL | Freq: Once | RESPIRATORY_TRACT | Status: AC
  Administered 2022-08-22: 3 mL via RESPIRATORY_TRACT

## 2022-08-22 NOTE — ED Notes (Signed)
SpO2 while ambulating remained between 92% and 94%. Pt walked with assistance.

## 2022-08-22 NOTE — ED Notes (Signed)
Nurse called Mickel Baas patient daughter and informed her that patient is being discharged, Mickel Baas stated she will be at facility shortly to get patient.

## 2022-08-22 NOTE — Discharge Instructions (Addendum)
Please increase your furosemide to 80 mg (2 tablets) every day.  Follow-up with your primary care provider in 1 week to assess effectiveness of your diuresis and whether you need to continue the higher dose of the diuretic.  Return to the emergency department if symptoms are getting worse.

## 2022-08-22 NOTE — ED Triage Notes (Signed)
Pt to ED via EMS from home. EMS reports that pt has been shob and it got worse today. C/o increased weakness and nonproductive cough. States grandchild was recently dx with RSV that he has been around. States wife is admitted with PNA. Pt had COVID 1 month ago. Pt has hx of dementia and CHF  O2 sat on RA 86-88%  EMS gave '5mg'$  albuterol, 0.'5mg'$  atrovent, and '125mg'$  solumedrol. Placed pt on 3L. O2 came up to 100%.  BP 138/80 HR 60s CBG 150

## 2022-08-22 NOTE — ED Provider Notes (Signed)
[facility] DEPT Provider Note   CSN: [id number] Arrival date & time: 09/04/2022  0058     History  Chief Complaint  Patient presents with   Shortness of Breath    [name] is a 76 y.o. male.  The history is provided by the patient and the EMS personnel. The history is limited by the condition of the patient (Dementia).  Shortness of Breath He has history of hypertension, diabetes, hyperlipidemia, systolic heart failure and comes in by ambulance because of shortness of breath.  He has had a nonproductive cough and denies fever or chills.  Cough reportedly got worse today.  Shortness of breath also got worse today.  EMS noted oxygen saturation of 86-88% on room air and started supplemental oxygen and gave a nebulizer treatment with albuterol and ipratropium as well as intravenous methylprednisolone.  Oxygen saturation improved following that treatment.  He has had sick contacts.  His wife is hospitalized with pneumonia, and his grandchild has been diagnosed with RSV.  Patient had an episode of COVID-19 about 1 month ago.  He is currently denying any chest pain, heaviness, tightness, pressure.  He is denying any nausea or vomiting.  He denies diaphoresis.   Home Medications Prior to Admission medications   Medication Sig Start Date End Date Taking? Authorizing Provider  amiodarone (PACERONE) 200 MG tablet Take 200 mg by mouth daily. 07/03/22  Yes [provider]  acetaminophen (TYLENOL) 325 MG tablet Take 650 mg by mouth 2 (two) times daily as needed (pain).    [provider]  apixaban (ELIQUIS) 5 MG TABS tablet TAKE 1 TABLET BY MOUTH TWICE A DAY Patient taking differently: Take 5 mg by mouth 2 (two) times daily. 08/23/21   Buford Dresser, MD  ascorbic acid (VITAMIN C) 500 MG tablet Take 1 tablet (500 mg total) by mouth daily. 07/20/22   Allie Bossier, MD  atorvastatin (LIPITOR) 20 MG tablet TAKE 1 TABLET BY MOUTH EVERY  DAY Patient taking differently: Take 20 mg by mouth daily. 07/04/22   [name], MD  Ensure Max Protein (ENSURE MAX PROTEIN) LIQD Take 330 mLs (11 oz total) by mouth at bedtime. 07/19/22   Allie Bossier, MD  FLUoxetine (PROZAC) 40 MG capsule TAKE 1 CAPSULE BY MOUTH EVERY DAY Patient taking differently: Take 40 mg by mouth daily. 01/03/22   [name], MD  furosemide (LASIX) 40 MG tablet TAKE 1 TABLET BY MOUTH EVERY DAY Patient taking differently: Take 40 mg by mouth in the morning. 07/04/22   [name], MD  guaiFENesin (MUCINEX) 600 MG 12 hr tablet Take 1 tablet (600 mg total) by mouth 2 (two) times daily. 07/19/22   Allie Bossier, MD  LORazepam (ATIVAN) 0.5 MG tablet Take 1 tablet (0.5 mg total) by mouth every 8 (eight) hours as needed for anxiety. Patient taking differently: Take 0.5 mg by mouth every 8 (eight) hours as needed for anxiety or sleep. 06/27/22   [name], MD  melatonin 5 MG TABS Take 1 tablet (5 mg total) by mouth at bedtime. 07/19/22   Allie Bossier, MD  metFORMIN (GLUCOPHAGE) 500 MG tablet Take 1 tablet (500 mg total) by mouth 2 (two) times daily with a meal. 07/22/22   [name], MD  methimazole (TAPAZOLE) 10 MG tablet Take 1 tablet (10 mg total) by mouth 3 (three) times daily. 06/27/22   [name], MD  metoprolol succinate (TOPROL-XL) 25 MG 24 hr tablet  Take 1 tablet (25 mg total) by mouth daily. 07/22/22   [name], MD  Multiple Vitamins-Minerals (MULTIVITAMIN MEN 50+) TABS Take 1 tablet by mouth daily with breakfast.    [provider]  nutrition supplement, JUVEN, (JUVEN) PACK Take 1 packet by mouth 2 (two) times daily between meals. 07/19/22   Allie Bossier, MD  OLANZapine (ZYPREXA) 5 MG tablet Take 1 tablet (5 mg total) by mouth 2 (two) times daily. 07/22/22   [name], MD  ondansetron (ZOFRAN) 4 MG tablet Take 1 tablet (4 mg total) by mouth every 6 (six) hours as needed for nausea. 07/19/22   Allie Bossier, MD  pantoprazole  (PROTONIX) 40 MG tablet Take 1 tablet (40 mg total) by mouth at bedtime. 07/22/22   [name], MD  polyethylene glycol (MIRALAX / GLYCOLAX) 17 g packet Take 17 g by mouth daily as needed for mild constipation. 07/19/22   Allie Bossier, MD  pramoxine [facility] SENSITIVE) 1 % LOTN Apply 1 Application topically 2 (two) times daily as needed (for itching).    [provider]  senna (SENOKOT) 8.6 MG TABS tablet Take 1 tablet (8.6 mg total) by mouth 2 (two) times daily. 07/19/22   Allie Bossier, MD  tamsulosin (FLOMAX) 0.4 MG CAPS capsule Take 1 capsule (0.4 mg total) by mouth daily. 06/27/22   [name], MD  Zinc Oxide (TRIPLE PASTE) 12.8 % ointment Apply topically 2 (two) times daily. 07/19/22   Allie Bossier, MD  zinc sulfate 220 (50 Zn) MG capsule Take 1 capsule (220 mg total) by mouth daily. 07/20/22   Allie Bossier, MD      Allergies    Amiodarone    Review of Systems   Review of Systems  Unable to perform ROS: Dementia  Respiratory:  Positive for shortness of breath.     Physical Exam Updated Vital Signs BP (!) 141/89   Pulse 64   Temp (!) 97.4 F (36.3 C) (Oral)   Resp (!) 25   Ht '6\' 4"'$  (1.93 m)   Wt 99.8 kg   SpO2 98%   BMI 26.78 kg/m  Physical Exam Vitals and nursing note reviewed.   76 year old male, resting comfortably and in no acute distress. Vital signs are significant for elevated blood pressure. Oxygen saturation is 92%, which is normal. Head is normocephalic and atraumatic. PERRLA, EOMI. Oropharynx is clear. Neck is nontender and supple without adenopathy or JVD. Back is nontender and there is no CVA tenderness. Lungs have rales at the left base, and have scattered expiratory wheezes.  There are no rhonchi. Chest is nontender. Heart has regular rate and rhythm without murmur. Abdomen is soft, flat, nontender without masses or hepatosplenomegaly and peristalsis is normoactive. Extremities have 2-3+ edema with moderate venous stasis changes  bilaterally.  Calf circumference is symmetric. Skin is warm and dry without rash. Neurologic: Awake and alert, normal speech, cranial nerves are intact, moves all extremities equally.  ED Results / Procedures / Treatments   Labs (all labs ordered are listed, but only abnormal results are displayed) Labs Reviewed  BRAIN NATRIURETIC PEPTIDE - Abnormal; Notable for the following components:      Result Value   B Natriuretic Peptide >4,500.0 (*)    All other components within normal limits  BASIC METABOLIC PANEL - Abnormal; Notable for the following components:   Glucose, Bld 136 (*)    Calcium 8.6 (*)    All other components within  normal limits  CBC WITH DIFFERENTIAL/PLATELET - Abnormal; Notable for the following components:   RBC 4.20 (*)    Hemoglobin 12.2 (*)    RDW 17.8 (*)    All other components within normal limits  TROPONIN I (HIGH SENSITIVITY)    EKG EKG Interpretation  Date/Time:  Monday August 22 2022 01:06:18 EST Ventricular Rate:  69 PR Interval:  267 QRS Duration: 136 QT Interval:  477 QTC Calculation: 512 R Axis:   -68 Text Interpretation: Sinus rhythm Prolonged PR interval Probable left atrial enlargement Nonspecific IVCD with LAD Borderline T abnormalities, lateral leads When compared with ECG of 07/07/2022, No significant change was found Confirmed by Delora Fuel ([id number]) on 09/03/2022 1:40:42 AM  Radiology DG Chest 2 View  Result Date: 09/06/2022 CLINICAL DATA:  Shortness of breath EXAM: CHEST - 2 VIEW COMPARISON:  Radiographs 07/07/2022 FINDINGS: Stable cardiomegaly. Pulmonary vascular congestion. Aortic atherosclerotic calcification. Right-greater-than-left hazy airspace and interstitial opacities. Small left-greater-than-right pleural effusions. No pneumothorax. No acute osseous abnormality. IMPRESSION: Findings suggestive of congestive heart failure. Electronically Signed   By: Placido Sou M.D.   On: 08/28/2022 03:40    Procedures Procedures     Medications Ordered in ED Medications  ipratropium-albuterol (DUONEB) 0.5-2.5 (3) MG/3ML nebulizer solution 3 mL (3 mLs Nebulization Given 09/11/2022 0246)  furosemide (LASIX) injection 40 mg (40 mg Intravenous Given 09/10/2022 0430)    ED Course/ Medical Decision Making/ A&P                           Medical Decision Making Amount and/or Complexity of Data Reviewed Labs: ordered. Radiology: ordered.  Risk Prescription drug management.   Cough with shortness of breath and hypoxia.  Consider pneumonia, CHF exacerbation, viral respiratory infection such as COVID-19 and influenza and RSV.  Doubt pulmonary embolism.  I have reviewed and interpreted his electrocardiogram, and my interpretation is nonspecific T wave flattening unchanged from prior.  I have ordered chest x-ray to rule out pneumonia, CBC, troponin, BNP.  I have ordered a nebulizer treatment with albuterol and ipratropium.  I have reviewed his past records, and an echocardiogram on 05/07/2022 showed left ventricular ejection fraction of 25-30% with grade 3 diastolic dysfunction.  I have reviewed and interpreted his laboratory test, and my interpretation is elevation of random glucose level, normal troponin, markedly elevated BNP consistent with CHF exacerbation, mild anemia which is actually improved compared with baseline.  I have ordered a dose of furosemide.  Patient had good diuresis in the emergency department.  I have taken him off of oxygen, and he is maintaining an adequate oxygen saturation on room air.  I have ordered ambulation in the emergency department and he had no oxygen desaturation while ambulating.  Therefore, I feel he can safely go home with an increased dose of diuretic and close follow-up with his primary care provider.  Final Clinical Impression(s) / ED Diagnoses Final diagnoses:  Acute on chronic combined systolic and diastolic heart failure ([place])  Chronic anticoagulation    Rx / DC Orders ED Discharge  Orders     None         Delora Fuel, MD 81/82/99 [phone]

## 2022-08-23 ENCOUNTER — Other Ambulatory Visit: Payer: Self-pay

## 2022-08-23 ENCOUNTER — Emergency Department (HOSPITAL_COMMUNITY): Payer: Medicare Other

## 2022-08-23 ENCOUNTER — Inpatient Hospital Stay (HOSPITAL_COMMUNITY)
Admission: EM | Admit: 2022-08-23 | Discharge: 2022-09-16 | DRG: 177 | Disposition: E | Payer: Medicare Other | Attending: Internal Medicine | Admitting: Internal Medicine

## 2022-08-23 ENCOUNTER — Encounter (HOSPITAL_COMMUNITY): Payer: Self-pay

## 2022-08-23 DIAGNOSIS — E213 Hyperparathyroidism, unspecified: Secondary | ICD-10-CM | POA: Diagnosis present

## 2022-08-23 DIAGNOSIS — Z833 Family history of diabetes mellitus: Secondary | ICD-10-CM

## 2022-08-23 DIAGNOSIS — R001 Bradycardia, unspecified: Secondary | ICD-10-CM | POA: Diagnosis not present

## 2022-08-23 DIAGNOSIS — T462X5A Adverse effect of other antidysrhythmic drugs, initial encounter: Secondary | ICD-10-CM | POA: Diagnosis present

## 2022-08-23 DIAGNOSIS — Z888 Allergy status to other drugs, medicaments and biological substances status: Secondary | ICD-10-CM

## 2022-08-23 DIAGNOSIS — I5043 Acute on chronic combined systolic (congestive) and diastolic (congestive) heart failure: Secondary | ICD-10-CM | POA: Diagnosis present

## 2022-08-23 DIAGNOSIS — G4733 Obstructive sleep apnea (adult) (pediatric): Secondary | ICD-10-CM | POA: Diagnosis present

## 2022-08-23 DIAGNOSIS — E7800 Pure hypercholesterolemia, unspecified: Secondary | ICD-10-CM

## 2022-08-23 DIAGNOSIS — Z515 Encounter for palliative care: Secondary | ICD-10-CM | POA: Diagnosis not present

## 2022-08-23 DIAGNOSIS — I428 Other cardiomyopathies: Secondary | ICD-10-CM | POA: Diagnosis present

## 2022-08-23 DIAGNOSIS — F0390 Unspecified dementia without behavioral disturbance: Secondary | ICD-10-CM | POA: Diagnosis not present

## 2022-08-23 DIAGNOSIS — Z7984 Long term (current) use of oral hypoglycemic drugs: Secondary | ICD-10-CM

## 2022-08-23 DIAGNOSIS — I130 Hypertensive heart and chronic kidney disease with heart failure and stage 1 through stage 4 chronic kidney disease, or unspecified chronic kidney disease: Secondary | ICD-10-CM | POA: Diagnosis present

## 2022-08-23 DIAGNOSIS — U071 COVID-19: Secondary | ICD-10-CM

## 2022-08-23 DIAGNOSIS — E1122 Type 2 diabetes mellitus with diabetic chronic kidney disease: Secondary | ICD-10-CM | POA: Diagnosis present

## 2022-08-23 DIAGNOSIS — I5023 Acute on chronic systolic (congestive) heart failure: Secondary | ICD-10-CM | POA: Diagnosis not present

## 2022-08-23 DIAGNOSIS — Z79899 Other long term (current) drug therapy: Secondary | ICD-10-CM

## 2022-08-23 DIAGNOSIS — F0392 Unspecified dementia, unspecified severity, with psychotic disturbance: Secondary | ICD-10-CM | POA: Diagnosis present

## 2022-08-23 DIAGNOSIS — E0580 Other thyrotoxicosis without thyrotoxic crisis or storm: Secondary | ICD-10-CM | POA: Diagnosis present

## 2022-08-23 DIAGNOSIS — Z66 Do not resuscitate: Secondary | ICD-10-CM | POA: Diagnosis present

## 2022-08-23 DIAGNOSIS — N179 Acute kidney failure, unspecified: Secondary | ICD-10-CM | POA: Diagnosis present

## 2022-08-23 DIAGNOSIS — I480 Paroxysmal atrial fibrillation: Secondary | ICD-10-CM | POA: Diagnosis present

## 2022-08-23 DIAGNOSIS — Z87891 Personal history of nicotine dependence: Secondary | ICD-10-CM | POA: Diagnosis not present

## 2022-08-23 DIAGNOSIS — N1831 Chronic kidney disease, stage 3a: Secondary | ICD-10-CM | POA: Diagnosis present

## 2022-08-23 DIAGNOSIS — Z8249 Family history of ischemic heart disease and other diseases of the circulatory system: Secondary | ICD-10-CM

## 2022-08-23 DIAGNOSIS — I10 Essential (primary) hypertension: Secondary | ICD-10-CM | POA: Diagnosis not present

## 2022-08-23 DIAGNOSIS — G9341 Metabolic encephalopathy: Secondary | ICD-10-CM | POA: Diagnosis present

## 2022-08-23 DIAGNOSIS — E0590 Thyrotoxicosis, unspecified without thyrotoxic crisis or storm: Secondary | ICD-10-CM | POA: Diagnosis not present

## 2022-08-23 DIAGNOSIS — E785 Hyperlipidemia, unspecified: Secondary | ICD-10-CM | POA: Diagnosis present

## 2022-08-23 DIAGNOSIS — J9602 Acute respiratory failure with hypercapnia: Secondary | ICD-10-CM | POA: Diagnosis present

## 2022-08-23 DIAGNOSIS — Z781 Physical restraint status: Secondary | ICD-10-CM

## 2022-08-23 DIAGNOSIS — Z8572 Personal history of non-Hodgkin lymphomas: Secondary | ICD-10-CM

## 2022-08-23 DIAGNOSIS — I509 Heart failure, unspecified: Secondary | ICD-10-CM

## 2022-08-23 DIAGNOSIS — J9601 Acute respiratory failure with hypoxia: Secondary | ICD-10-CM | POA: Diagnosis present

## 2022-08-23 DIAGNOSIS — N184 Chronic kidney disease, stage 4 (severe): Secondary | ICD-10-CM | POA: Diagnosis not present

## 2022-08-23 DIAGNOSIS — Z7901 Long term (current) use of anticoagulants: Secondary | ICD-10-CM

## 2022-08-23 DIAGNOSIS — R0602 Shortness of breath: Secondary | ICD-10-CM | POA: Diagnosis present

## 2022-08-23 LAB — BASIC METABOLIC PANEL
Anion gap: 9 (ref 5–15)
BUN: 26 mg/dL — ABNORMAL HIGH (ref 8–23)
CO2: 29 mmol/L (ref 22–32)
Calcium: 8.5 mg/dL — ABNORMAL LOW (ref 8.9–10.3)
Chloride: 104 mmol/L (ref 98–111)
Creatinine, Ser: 1.29 mg/dL — ABNORMAL HIGH (ref 0.61–1.24)
GFR, Estimated: 57 mL/min — ABNORMAL LOW (ref >60)
Glucose, Bld: 132 mg/dL — ABNORMAL HIGH (ref 70–99)
Potassium: 4 mmol/L (ref 3.5–5.1)
Sodium: 142 mmol/L (ref 135–145)

## 2022-08-23 LAB — CBC
HCT: 44.9 % (ref 39.0–52.0)
Hemoglobin: 13 g/dL (ref 13.0–17.0)
MCH: 28.3 pg (ref 26.0–34.0)
MCHC: 29 g/dL — ABNORMAL LOW (ref 30.0–36.0)
MCV: 97.6 fL (ref 80.0–100.0)
Platelets: 287 10*3/uL (ref 150–400)
RBC: 4.6 MIL/uL (ref 4.22–5.81)
RDW: 18 % — ABNORMAL HIGH (ref 11.5–15.5)
WBC: 10.1 10*3/uL (ref 4.0–10.5)
nRBC: 0 % (ref 0.0–0.2)

## 2022-08-23 LAB — URINALYSIS, ROUTINE W REFLEX MICROSCOPIC
Bacteria, UA: NONE SEEN
Bilirubin Urine: NEGATIVE
Glucose, UA: NEGATIVE mg/dL
Hgb urine dipstick: NEGATIVE
Ketones, ur: NEGATIVE mg/dL
Leukocytes,Ua: NEGATIVE
Nitrite: NEGATIVE
Protein, ur: 30 mg/dL — AB
Specific Gravity, Urine: 1.009 (ref 1.005–1.030)
pH: 5 (ref 5.0–8.0)

## 2022-08-23 LAB — TROPONIN I (HIGH SENSITIVITY)
Troponin I (High Sensitivity): 17 ng/L (ref <18)
Troponin I (High Sensitivity): 20 ng/L — ABNORMAL HIGH (ref <18)

## 2022-08-23 LAB — RESP PANEL BY RT-PCR (FLU A&B, COVID) ARPGX2
Influenza A by PCR: NEGATIVE
Influenza B by PCR: NEGATIVE
SARS Coronavirus 2 by RT PCR: POSITIVE — AB

## 2022-08-23 LAB — BRAIN NATRIURETIC PEPTIDE: B Natriuretic Peptide: >4500 pg/mL — ABNORMAL HIGH (ref 0.0–100.0)

## 2022-08-23 LAB — T4, FREE: Free T4: 1.15 ng/dL — ABNORMAL HIGH (ref 0.61–1.12)

## 2022-08-23 LAB — TSH: TSH: 2.88 u[IU]/mL (ref 0.350–4.500)

## 2022-08-23 MED ORDER — FUROSEMIDE 10 MG/ML IJ SOLN
80.0000 mg | Freq: Once | INTRAMUSCULAR | Status: AC
  Administered 2022-08-23: 80 mg via INTRAVENOUS
  Filled 2022-08-23: qty 8

## 2022-08-23 MED ORDER — IPRATROPIUM-ALBUTEROL 0.5-2.5 (3) MG/3ML IN SOLN
3.0000 mL | Freq: Once | RESPIRATORY_TRACT | Status: AC
  Administered 2022-08-23: 3 mL via RESPIRATORY_TRACT
  Filled 2022-08-23: qty 3

## 2022-08-23 NOTE — Assessment & Plan Note (Signed)
creatinine of 1.25 up from 1.07 -monitor closely while on diuretic

## 2022-08-23 NOTE — ED Notes (Signed)
Pt took off gown, removed nasal cannula, pulse ox and all leads for a second time. Replaced O2 and all monitoring devices and reminded pt to not remove them.

## 2022-08-23 NOTE — H&P (Signed)
History and Physical    Patient: [name] DOB: Feb 28, 1946 DOA: 09/12/2022 DOS: the patient was seen and examined on 09-02-2022 PCP: [name], MD  Patient coming from: Home  Chief Complaint:  Chief Complaint  Patient presents with   Shortness of Breath   Hallucinations   HPI: [name] is a 76 y.o. male with medical history significant of dementia, PAF on Eliquis, HFrEF (EF of 25-30% with grade 3 diastolic dysfunction 5/03), T2DM, HTN, T2DM, CKD3a, OSA who presents with AMS and shortness of breath.   Pt not able to provide hx due to AMS and lethargy.  He was just evaluated in ED yesterday for cough, shortness of breath with oxygen saturat of 86-88% on room air. Treated initially with duoneb, IV methylprednisone. Subsequently found to be in CHF exacerbation with BNP >4500 with CXR findings of increase edema. He diuresed well with IV lasix and was able to come off oxygen was discharge home on increase dose of Lasix.  Family reports that he became more short of breath and had increase confusion.  Has sick contact with wife who is also admitted here for pneumonia.  In the ED, he was afebrile, normotensive and on 3L.  WBC of 10, hemoglobin of 13, platelet of 287  Sodium of 142, potassium 4, glucose of 132, creatinine of 1.25 up from 1.07  COVID was positive, flu negative  BNP remains elevated greater than 4500.  Chest x-ray showing bilateral pulmonary edema with cardiomegaly.  He was given IV 80 mg of Lasix and DuoNeb in the ED.  Hospitalist then consulted for admission.   Review of Systems: unable to review all systems due to the inability of the patient to answer questions. Past Medical History:  Diagnosis Date   Chronic systolic CHF (congestive heart failure) ([place])    a. Echo (08/28/13): Mild LVH, EF 35-40%, diffuse HK, moderate to severe LAE.   Diabetes mellitus without complication (HCC)    Diverticulitis    Headache(784.0)     Hyperlipidemia    Hypertension    Lymphoma, non Hodgkin's    sees Dr. [name]    Morbid obesity [facility])    NICM (nonischemic cardiomyopathy) ([place])    a. R/L Heart cath 08/30/13: RA mean 8, RV 30/0, PA 27/12, mean PCWP 9, CO 5.67, CI 1.98; normal coronary arteries   Sleep apnea    Past Surgical History:  Procedure Laterality Date   CARDIOVERSION N/A 06/28/2019   Procedure: CARDIOVERSION;  Surgeon: Lelon Perla, MD;  Location: [facility];  Service: Cardiovascular;  Laterality: N/A;   COLONOSCOPY  11/26/2021   HERNIA REPAIR     umblical   incarcerated hernia     vental 11/19/08 Dr. [name]   LEFT AND RIGHT HEART CATHETERIZATION WITH CORONARY ANGIOGRAM N/A 08/30/2013   Procedure: LEFT AND RIGHT HEART CATHETERIZATION WITH CORONARY ANGIOGRAM;  Surgeon: [name], MD;  Location: [facility] CATH LAB;  Service: Cardiovascular;  Laterality: N/A;   Social History:  reports that he quit smoking about 23 years ago. His smoking use included cigarettes. He started smoking about 57 years ago. He has a 104.00 pack-year smoking history. He has never used smokeless tobacco. He reports current alcohol use. He reports that he does not use drugs.  Allergies  Allergen Reactions   Amiodarone Other (See Comments)    Thyroid toxicity    Family History  Problem Relation Age of Onset   Heart failure Mother    Hypertension Mother  Cancer Father        colon   Hypertension Father    Diabetes Father    Stroke Maternal Grandmother    Diabetes Paternal Grandfather    Cancer Maternal Grandfather     Prior to Admission medications   Medication Sig Start Date End Date Taking? Authorizing Provider  acetaminophen (TYLENOL) 325 MG tablet Take 650 mg by mouth 2 (two) times daily as needed for mild pain.   Yes [provider]  apixaban (ELIQUIS) 5 MG TABS tablet TAKE 1 TABLET BY MOUTH TWICE A DAY Patient taking differently: Take 5 mg by mouth 2 (two) times daily. 08/23/21  Yes Buford Dresser, MD  ascorbic acid (VITAMIN C) 500 MG tablet Take 1 tablet (500 mg total) by mouth daily. 07/20/22  Yes Allie Bossier, MD  atorvastatin (LIPITOR) 20 MG tablet TAKE 1 TABLET BY MOUTH EVERY DAY Patient taking differently: Take 20 mg by mouth daily. 07/04/22  Yes [name], MD  Ensure Max Protein (ENSURE MAX PROTEIN) LIQD Take 330 mLs (11 oz total) by mouth at bedtime. 07/19/22  Yes Allie Bossier, MD  FLUoxetine (PROZAC) 40 MG capsule TAKE 1 CAPSULE BY MOUTH EVERY DAY Patient taking differently: Take 40 mg by mouth daily. 01/03/22  Yes [name], MD  furosemide (LASIX) 40 MG tablet TAKE 1 TABLET BY MOUTH EVERY DAY Patient taking differently: Take 40 mg by mouth in the morning. 07/04/22  Yes [name], MD  LORazepam (ATIVAN) 0.5 MG tablet Take 1 tablet (0.5 mg total) by mouth every 8 (eight) hours as needed for anxiety. Patient taking differently: Take 0.5 mg by mouth every 8 (eight) hours as needed for anxiety or sleep. 06/27/22  Yes [name], MD  melatonin 5 MG TABS Take 1 tablet (5 mg total) by mouth at bedtime. 07/19/22  Yes Allie Bossier, MD  metFORMIN (GLUCOPHAGE) 500 MG tablet Take 1 tablet (500 mg total) by mouth 2 (two) times daily with a meal. 07/22/22  Yes [name], MD  methimazole (TAPAZOLE) 10 MG tablet Take 1 tablet (10 mg total) by mouth 3 (three) times daily. 06/27/22  Yes [name], MD  metoprolol succinate (TOPROL-XL) 25 MG 24 hr tablet Take 1 tablet (25 mg total) by mouth daily. 07/22/22  Yes [name], MD  Multiple Vitamins-Minerals (MULTIVITAMIN MEN 50+) TABS Take 1 tablet by mouth daily with breakfast.   Yes [provider]  OLANZapine (ZYPREXA) 5 MG tablet Take 1 tablet (5 mg total) by mouth 2 (two) times daily. 07/22/22  Yes [name], MD  ondansetron (ZOFRAN) 4 MG tablet Take 1 tablet (4 mg total) by mouth every 6 (six) hours as needed for nausea. 07/19/22  Yes Allie Bossier, MD  pantoprazole (PROTONIX) 40 MG tablet Take  1 tablet (40 mg total) by mouth at bedtime. 07/22/22  Yes [name], MD  polyethylene glycol (MIRALAX / GLYCOLAX) 17 g packet Take 17 g by mouth daily as needed for mild constipation. 07/19/22  Yes Allie Bossier, MD  senna (SENOKOT) 8.6 MG TABS tablet Take 1 tablet (8.6 mg total) by mouth 2 (two) times daily. 07/19/22  Yes Allie Bossier, MD  tamsulosin (FLOMAX) 0.4 MG CAPS capsule Take 1 capsule (0.4 mg total) by mouth daily. 06/27/22  Yes [name], MD  zinc sulfate 220 (50 Zn) MG capsule Take 1 capsule (220 mg total) by mouth daily. 07/20/22  Yes Allie Bossier, MD  guaiFENesin (MUCINEX) 600 MG 12 hr tablet Take 1 tablet (600 mg total) by mouth 2 (two) times daily. Patient not taking: Reported on 09/07/2022 07/19/22   Allie Bossier, MD  nutrition supplement, Fanny Dance, [facility]) PACK Take 1 packet by mouth 2 (two) times daily between meals. Patient not taking: Reported on 09/02/2022 07/19/22   Allie Bossier, MD  Zinc Oxide (TRIPLE PASTE) 12.8 % ointment Apply topically 2 (two) times daily. Patient not taking: Reported on 09/05/2022 07/19/22   Allie Bossier, MD    Physical Exam: Vitals:   08/30/2022 2030 09/13/2022 2130 09/08/2022 2250 08/18/2022 2255  BP: (!) 136/90 (!) 140/88 139/76   Pulse: 69 67 69 67  Resp: '20 20 19 20  '$ Temp:  97.7 F (36.5 C)    TempSrc:  Oral    SpO2: 97% 96% (!) 87% 93%   Constitutional: NAD, lethargic, ill-appearing diaphoretic male sitting upright in bed.  Patient responsive to light touch but would drift off back to sleep. Eyes:  lids and conjunctivae normal ENMT: Mucous membranes are moist.  Neck: normal, supple Respiratory: Diffuse rhonchorous lung sounds but no wheezing.  Normal respiratory effort. No accessory muscle use.  On 4 L of nasal cannula oxygen supplementation. Cardiovascular: Regular rate and rhythm, no murmurs / rubs / gallops.  +2 pitting edema of the right lower extremity, +4 pitting edema of the left lower extremity  abdomen: No grimacing with  palpation of the abdomen.  Bowel sounds positive.  Musculoskeletal: no clubbing / cyanosis. No joint deformity upper and lower extremities. Normal muscle tone.  Skin: no rashes, lesions, ulcers. No induration Neurologic: Patient is lethargic but woke up to light touch but drifts back off to sleep.  Not able to answer questions or follow commands.   Psychiatric: Not able to assess due to lethargy. Data Reviewed:  See HPI  Assessment and Plan: * Acute on chronic congestive heart failure (HCC) -EF of 25-30% with grade 3 diastolic dysfunction on echo 7/23 -CXR with cardiomegaly, pulmonary edema and BNP greater than 4500 -Continue daily IV 40 mg Lasix - Strict intake and output, daily weights -Obtain repeat echocardiogram  Acute hypoxic respiratory failure ([place]) Secondary to acute on chronic CHF exacerbation and COVID-19 viral infection -treat with IV diuretic and antiviral -wean as tolerated with goal O2 >92%  COVID-19 virus infection -requiring O2 supplement which is multifactorial from infection and CHF exacerbation -start IV remdesivir  Acute renal failure superimposed on stage 3a chronic kidney disease (HCC) creatinine of 1.25 up from 1.07 -monitor closely while on diuretic  Type 2 diabetes mellitus with diabetic chronic kidney disease ([place]) Controlled.  Last hemoglobin A1c of 5.7 9/23  Hyperthyroidism Amiodarone induced hyperparathyroidism -TSH normal on presentation -continue methimazole  Acute metabolic encephalopathy -Secondary to acute hypoxic resp failure, CHF exac and COVID with underlying dementia -soft restraints placed since pt was pulling off oxygen -continue to follow clinically with treatment  PAF (paroxysmal atrial fibrillation) (HCC) Continue Eliquis  HLD (hyperlipidemia) Continue statin  Essential hypertension Continue home antihypertensives      Advance Care Planning:   Code Status: DNR DNR-per son   Consults: none  Family Communication:  attempted to call daughter who lives locally but no answer. Called son, [name] who lives in [place]  Severity of Illness: The appropriate patient status for this patient is INPATIENT. Inpatient status is judged to be reasonable and necessary in order to provide the required intensity of service to ensure the patient's safety. The patient's presenting symptoms, physical  exam findings, and initial radiographic and laboratory data in the context of their chronic comorbidities is felt to place them at high risk for further clinical deterioration. Furthermore, it is not anticipated that the patient will be medically stable for discharge from the hospital within 2 midnights of admission.   * I certify that at the point of admission it is my clinical judgment that the patient will require inpatient hospital care spanning beyond 2 midnights from the point of admission due to high intensity of service, high risk for further deterioration and high frequency of surveillance required.*  Author: Orene Desanctis, DO 09-20-22 12:44 AM  For on call review [email or web address].

## 2022-08-23 NOTE — ED Provider Notes (Signed)
[name] Provider Note   CSN: [id number] Arrival date & time: 08/26/2022  1812     History  Chief Complaint  Patient presents with   Shortness of Breath   Hallucinations    [name] is a 76 y.o. male who presents with worsening cough, shortness of breath, AMS in context of recent emergency department visit for her failure exacerbation, additionally patient with some cough, but denies any fever, chills, patient reports no recent sick contacts with wife in the hospital for pneumonia bilaterally, additional family sick with upper respiratory symptoms.  Yesterday he was seen, had BNP greater than 4500, was able to be diuresed in the emergency department and discharged at that time, since then family reports worsening shortness of breath despite doubling Lasix, and them crease and confusion, wants patient to be evaluated for need for admission, UTI, thyroid abnormality versus other.   Shortness of Breath      Home Medications Prior to Admission medications   Medication Sig Start Date End Date Taking? Authorizing Provider  acetaminophen (TYLENOL) 325 MG tablet Take 650 mg by mouth 2 (two) times daily as needed for mild pain.   Yes [provider]  apixaban (ELIQUIS) 5 MG TABS tablet TAKE 1 TABLET BY MOUTH TWICE A DAY Patient taking differently: Take 5 mg by mouth 2 (two) times daily. 08/23/21  Yes Buford Dresser, MD  ascorbic acid (VITAMIN C) 500 MG tablet Take 1 tablet (500 mg total) by mouth daily. 07/20/22  Yes Allie Bossier, MD  atorvastatin (LIPITOR) 20 MG tablet TAKE 1 TABLET BY MOUTH EVERY DAY Patient taking differently: Take 20 mg by mouth daily. 07/04/22  Yes [name], MD  Ensure Max Protein (ENSURE MAX PROTEIN) LIQD Take 330 mLs (11 oz total) by mouth at bedtime. 07/19/22  Yes Allie Bossier, MD  FLUoxetine (PROZAC) 40 MG capsule TAKE 1 CAPSULE BY MOUTH EVERY DAY Patient taking differently: Take 40 mg by mouth  daily. 01/03/22  Yes [name], MD  furosemide (LASIX) 40 MG tablet TAKE 1 TABLET BY MOUTH EVERY DAY Patient taking differently: Take 40 mg by mouth in the morning. 07/04/22  Yes [name], MD  LORazepam (ATIVAN) 0.5 MG tablet Take 1 tablet (0.5 mg total) by mouth every 8 (eight) hours as needed for anxiety. Patient taking differently: Take 0.5 mg by mouth every 8 (eight) hours as needed for anxiety or sleep. 06/27/22  Yes [name], MD  melatonin 5 MG TABS Take 1 tablet (5 mg total) by mouth at bedtime. 07/19/22  Yes Allie Bossier, MD  metFORMIN (GLUCOPHAGE) 500 MG tablet Take 1 tablet (500 mg total) by mouth 2 (two) times daily with a meal. 07/22/22  Yes [name], MD  methimazole (TAPAZOLE) 10 MG tablet Take 1 tablet (10 mg total) by mouth 3 (three) times daily. 06/27/22  Yes [name], MD  metoprolol succinate (TOPROL-XL) 25 MG 24 hr tablet Take 1 tablet (25 mg total) by mouth daily. 07/22/22  Yes [name], MD  Multiple Vitamins-Minerals (MULTIVITAMIN MEN 50+) TABS Take 1 tablet by mouth daily with breakfast.   Yes [provider]  OLANZapine (ZYPREXA) 5 MG tablet Take 1 tablet (5 mg total) by mouth 2 (two) times daily. 07/22/22  Yes [name], MD  ondansetron (ZOFRAN) 4 MG tablet Take 1 tablet (4 mg total) by mouth every 6 (six) hours as needed for nausea. 07/19/22  Yes Allie Bossier,  MD  pantoprazole (PROTONIX) 40 MG tablet Take 1 tablet (40 mg total) by mouth at bedtime. 07/22/22  Yes [name], MD  polyethylene glycol (MIRALAX / GLYCOLAX) 17 g packet Take 17 g by mouth daily as needed for mild constipation. 07/19/22  Yes Allie Bossier, MD  senna (SENOKOT) 8.6 MG TABS tablet Take 1 tablet (8.6 mg total) by mouth 2 (two) times daily. 07/19/22  Yes Allie Bossier, MD  tamsulosin (FLOMAX) 0.4 MG CAPS capsule Take 1 capsule (0.4 mg total) by mouth daily. 06/27/22  Yes [name], MD  zinc sulfate 220 (50 Zn) MG capsule Take 1 capsule (220 mg total)  by mouth daily. 07/20/22  Yes Allie Bossier, MD  guaiFENesin (MUCINEX) 600 MG 12 hr tablet Take 1 tablet (600 mg total) by mouth 2 (two) times daily. Patient not taking: Reported on 09/01/2022 07/19/22   Allie Bossier, MD  nutrition supplement, Fanny Dance, [facility]) PACK Take 1 packet by mouth 2 (two) times daily between meals. Patient not taking: Reported on 09/14/2022 07/19/22   Allie Bossier, MD  Zinc Oxide (TRIPLE PASTE) 12.8 % ointment Apply topically 2 (two) times daily. Patient not taking: Reported on 08/30/2022 07/19/22   Allie Bossier, MD      Allergies    Amiodarone    Review of Systems   Review of Systems  Respiratory:  Positive for shortness of breath.   All other systems reviewed and are negative.   Physical Exam Updated Vital Signs BP (!) 136/90   Pulse 69   Temp 97.6 F (36.4 C)   Resp 20   SpO2 97%  Physical Exam Vitals and nursing note reviewed.  Constitutional:      General: He is not in acute distress.    Appearance: Normal appearance.  HENT:     Head: Normocephalic and atraumatic.  Eyes:     General:        Right eye: No discharge.        Left eye: No discharge.  Cardiovascular:     Rate and Rhythm: Normal rate and regular rhythm.     Heart sounds: No murmur heard.    No friction rub. No gallop.  Pulmonary:     Effort: Pulmonary effort is normal.     Breath sounds: Normal breath sounds.     Comments: Increased work of breathing, patient is requiring oxygen, but is able to respiratory on oxygen without significant difficulty.  No significant wheezing, rhonchi Abdominal:     General: Bowel sounds are normal.     Palpations: Abdomen is soft.  Musculoskeletal:     Right lower leg: Edema present.     Left lower leg: Edema present.  Skin:    General: Skin is warm and dry.     Capillary Refill: Capillary refill takes less than 2 seconds.  Neurological:     Mental Status: He is alert.     Comments: Alert and oriented to self but not time, place. Moves all  four limbs spontaneously. No evidence of facial droop  Psychiatric:        Mood and Affect: Mood normal.        Behavior: Behavior normal.     ED Results / Procedures / Treatments   Labs (all labs ordered are listed, but only abnormal results are displayed) Labs Reviewed  RESP PANEL BY RT-PCR (FLU A&B, COVID) ARPGX2 - Abnormal; Notable for the following components:      Result Value  SARS Coronavirus 2 by RT PCR POSITIVE (*)    All other components within normal limits  BASIC METABOLIC PANEL - Abnormal; Notable for the following components:   Glucose, Bld 132 (*)    BUN 26 (*)    Creatinine, Ser 1.29 (*)    Calcium 8.5 (*)    GFR, Estimated 57 (*)    All other components within normal limits  CBC - Abnormal; Notable for the following components:   MCHC 29.0 (*)    RDW 18.0 (*)    All other components within normal limits  BRAIN NATRIURETIC PEPTIDE - Abnormal; Notable for the following components:   B Natriuretic Peptide >4,500.0 (*)    All other components within normal limits  TSH  T4, FREE  URINALYSIS, ROUTINE W REFLEX MICROSCOPIC  TROPONIN I (HIGH SENSITIVITY)  TROPONIN I (HIGH SENSITIVITY)    EKG None  Radiology DG Chest Port 1 View  Result Date: 08/26/2022 CLINICAL DATA:  Shortness of breath EXAM: PORTABLE CHEST 1 VIEW COMPARISON:  Radiographs 08/20/2022 FINDINGS: No significant change from 08/29/2022. Cardiomegaly. Pulmonary vascular congestion. Aortic atherosclerotic calcification. Bibasilar airspace and interstitial opacities probable small right pleural effusion. No pneumothorax. No acute osseous abnormality. IMPRESSION: Bilateral infiltrates in the lower lungs favored to represent edema though infection is not excluded. Cardiomegaly and pulmonary vascular congestion. Electronically Signed   By: Placido Sou M.D.   On: 08/21/2022 19:35   DG Chest 2 View  Result Date: 08/19/2022 CLINICAL DATA:  Shortness of breath EXAM: CHEST - 2 VIEW COMPARISON:  Radiographs  07/07/2022 FINDINGS: Stable cardiomegaly. Pulmonary vascular congestion. Aortic atherosclerotic calcification. Right-greater-than-left hazy airspace and interstitial opacities. Small left-greater-than-right pleural effusions. No pneumothorax. No acute osseous abnormality. IMPRESSION: Findings suggestive of congestive heart failure. Electronically Signed   By: Placido Sou M.D.   On: 09/12/2022 03:40    Procedures .Critical Care  Performed by: Anselmo Pickler, PA-C Authorized by: Anselmo Pickler, PA-C   Critical care provider statement:    Critical care time (minutes):  30   Critical care was necessary to treat or prevent imminent or life-threatening deterioration of the following conditions:  Respiratory failure (Acute hypoxic respiratory failure requiring oxygen)   Critical care was time spent personally by me on the following activities:  Development of treatment plan with patient or surrogate, discussions with consultants, evaluation of patient's response to treatment, examination of patient, ordering and review of laboratory studies, ordering and review of radiographic studies, ordering and performing treatments and interventions, pulse oximetry, re-evaluation of patient's condition and review of old charts   Care discussed with: admitting provider       Medications Ordered in ED Medications  furosemide (LASIX) injection 80 mg (80 mg Intravenous Given 08/31/2022 1852)  ipratropium-albuterol (DUONEB) 0.5-2.5 (3) MG/3ML nebulizer solution 3 mL (3 mLs Nebulization Given 08/30/2022 1901)    ED Course/ Medical Decision Making/ A&P                           Medical Decision Making Amount and/or Complexity of Data Reviewed Labs: ordered. Radiology: ordered.  Risk Prescription drug management. Decision regarding hospitalization.   This patient is a 76 y.o. male who presents to the ED for concern of shob, ams, this involves an extensive number of treatment options, and is a  complaint that carries with it a high risk of complications and morbidity. The emergent differential diagnosis prior to evaluation includes, but is not limited to,  asthma exacerbation, COPD  exacerbation, acute upper respiratory infection, acute bronchitis, chronic bronchitis, interstitial lung disease, ARDS, PE, pneumonia, atypical ACS, carbon monoxide poisoning, spontaneous pneumothorax versus other .   This is not an exhaustive differential.   Past Medical History / Co-morbidities / Social History: Non hodgkins lymphoma, chf, PAF, DM2, HTN, obesity  Additional history: Chart reviewed. Pertinent results include: Sensibly reviewed lab work, imaging from patient's previous emergency department evaluation yesterday showing greater than 4500 BNP  Physical Exam: Physical exam performed. The pertinent findings include: crackles at lung bases, lower extremity edema, confusion without focal neuro deficit  Lab Tests: I ordered, and personally interpreted labs.  The pertinent results include: Troponin minimally elevated from yesterday, with low suspicion for any ACS or ischemia.  TSH is unremarkable today, no suspicion for acute thyrotoxicosis as cause for his AMS, his BMP is notable for elevated creatinine at 1.29 compared to recent baseline, some minor AKI from diuresis.  He is COVID-positive.  BNP greater than 4500.  His UA still in process, low suspicion for urinary source for his AMS, will fu once UA resulted.   Imaging Studies: I ordered imaging studies including plain film chest x ray. I independently visualized and interpreted imaging which showed bilateral edema on lungs, cardiomegaly, pulmonary congestion. I agree with the radiologist interpretation.   Cardiac Monitoring:  The patient was maintained on a cardiac monitor.  My attending physician Dr. Doren Custard viewed and interpreted the cardiac monitored which showed an underlying rhythm of: NSR, prolonged PR, RBBB. I agree with this  interpretation.   Medications: I ordered medication including duoneb, laseix  for shob, heart failure exacerbation. Reevaluation of the patient after these medicines showed that the patient improved.  Patient's mental status is minimally improved after Lasix x1, he appears to be diuresing appropriately, however given his degree of CHF exacerbation I believe that he will require an inpatient hospitalization for full diuresis and management.  Consultations Obtained: I requested consultation with the hospitalist, spoke with Dr. Flossie Buffy, and discussed lab and imaging findings as well as pertinent plan - they recommend: admission for CHF exacerbation, covid 19   Disposition: After consideration of the diagnostic results and the patients response to treatment, I feel that patient would benefit from mission at this time, he is requiring oxygen for CHF exacerbation, and COVID-pneumonia.   I discussed this case with my attending physician Dr. Doren Custard who cosigned this note including patient's presenting symptoms, physical exam, and planned diagnostics and interventions. Attending physician stated agreement with plan or made changes to plan which were implemented.    Final Clinical Impression(s) / ED Diagnoses Final diagnoses:  Acute on chronic congestive heart failure, unspecified heart failure type [facility])  COVID-19    Rx / DC Orders ED Discharge Orders     None         Dorien Chihuahua 08/31/2022 2135    Godfrey Pick, MD 2022/09/08 [phone]

## 2022-08-23 NOTE — ED Triage Notes (Signed)
Pt from home via [facility] for shortness of breath and increased confusion/hallucinations per family. Pt has hx of dementia BP 146/70 O2 93% '@3'$  lpm HR 85

## 2022-08-23 NOTE — ED Notes (Signed)
Pt incontinent of urine. Brief removed and external urine collection device placed

## 2022-08-24 ENCOUNTER — Ambulatory Visit: Payer: Medicare Other | Admitting: Family Medicine

## 2022-08-24 ENCOUNTER — Inpatient Hospital Stay (HOSPITAL_COMMUNITY): Payer: Medicare Other

## 2022-08-24 DIAGNOSIS — F0390 Unspecified dementia without behavioral disturbance: Secondary | ICD-10-CM | POA: Diagnosis not present

## 2022-08-24 DIAGNOSIS — J9601 Acute respiratory failure with hypoxia: Secondary | ICD-10-CM | POA: Diagnosis not present

## 2022-08-24 DIAGNOSIS — I5043 Acute on chronic combined systolic (congestive) and diastolic (congestive) heart failure: Secondary | ICD-10-CM | POA: Diagnosis not present

## 2022-08-24 DIAGNOSIS — J9602 Acute respiratory failure with hypercapnia: Secondary | ICD-10-CM

## 2022-08-24 DIAGNOSIS — N184 Chronic kidney disease, stage 4 (severe): Secondary | ICD-10-CM

## 2022-08-24 DIAGNOSIS — G9341 Metabolic encephalopathy: Secondary | ICD-10-CM | POA: Diagnosis not present

## 2022-08-24 LAB — BASIC METABOLIC PANEL
Anion gap: 10 (ref 5–15)
BUN: 27 mg/dL — ABNORMAL HIGH (ref 8–23)
CO2: 29 mmol/L (ref 22–32)
Calcium: 8.6 mg/dL — ABNORMAL LOW (ref 8.9–10.3)
Chloride: 104 mmol/L (ref 98–111)
Creatinine, Ser: 1.23 mg/dL (ref 0.61–1.24)
GFR, Estimated: >60 mL/min (ref >60)
Glucose, Bld: 159 mg/dL — ABNORMAL HIGH (ref 70–99)
Potassium: 4.4 mmol/L (ref 3.5–5.1)
Sodium: 143 mmol/L (ref 135–145)

## 2022-08-24 LAB — CBC
HCT: 45.8 % (ref 39.0–52.0)
Hemoglobin: 13.5 g/dL (ref 13.0–17.0)
MCH: 29.3 pg (ref 26.0–34.0)
MCHC: 29.5 g/dL — ABNORMAL LOW (ref 30.0–36.0)
MCV: 99.3 fL (ref 80.0–100.0)
Platelets: 277 10*3/uL (ref 150–400)
RBC: 4.61 MIL/uL (ref 4.22–5.81)
RDW: 17.9 % — ABNORMAL HIGH (ref 11.5–15.5)
WBC: 10.3 10*3/uL (ref 4.0–10.5)
nRBC: 0 % (ref 0.0–0.2)

## 2022-08-24 LAB — BLOOD GAS, VENOUS
Acid-Base Excess: 2.2 mmol/L — ABNORMAL HIGH (ref 0.0–2.0)
Bicarbonate: 36.8 mmol/L — ABNORMAL HIGH (ref 20.0–28.0)
O2 Saturation: 86.5 %
Patient temperature: 37
pCO2, Ven: >123 mmHg (ref 44–60)
pH, Ven: 7.06 — CL (ref 7.25–7.43)
pO2, Ven: 58 mmHg — ABNORMAL HIGH (ref 32–45)

## 2022-08-24 LAB — GLUCOSE, CAPILLARY: Glucose-Capillary: 160 mg/dL — ABNORMAL HIGH (ref 70–99)

## 2022-08-24 LAB — AMMONIA: Ammonia: 54 umol/L — ABNORMAL HIGH (ref 9–35)

## 2022-08-24 LAB — CBG MONITORING, ED: Glucose-Capillary: 133 mg/dL — ABNORMAL HIGH (ref 70–99)

## 2022-08-24 MED ORDER — GLYCOPYRROLATE 0.2 MG/ML IJ SOLN: 0.2000 mg | INTRAMUSCULAR | Status: DC | PRN

## 2022-08-24 MED ORDER — BIOTENE DRY MOUTH MT LIQD: 15.0000 mL | OROMUCOSAL | Status: DC | PRN

## 2022-08-24 MED ORDER — FLUOXETINE HCL 20 MG PO CAPS
40.0000 mg | ORAL_CAPSULE | Freq: Every day | ORAL | Status: DC
  Administered 2022-08-24: 40 mg via ORAL
  Filled 2022-08-24: qty 2

## 2022-08-24 MED ORDER — IPRATROPIUM-ALBUTEROL 0.5-2.5 (3) MG/3ML IN SOLN
3.0000 mL | Freq: Once | RESPIRATORY_TRACT | Status: AC | PRN
  Administered 2022-08-24: 3 mL via RESPIRATORY_TRACT
  Filled 2022-08-24: qty 3

## 2022-08-24 MED ORDER — ONDANSETRON 4 MG PO TBDP: 4.0000 mg | ORAL_TABLET | Freq: Four times a day (QID) | ORAL | Status: DC | PRN

## 2022-08-24 MED ORDER — SODIUM CHLORIDE 0.9 % IV SOLN: 100.0000 mg | Freq: Every day | INTRAVENOUS | Status: DC

## 2022-08-24 MED ORDER — ACETAMINOPHEN 325 MG PO TABS: 650.0000 mg | ORAL_TABLET | Freq: Four times a day (QID) | ORAL | Status: DC | PRN

## 2022-08-24 MED ORDER — METHIMAZOLE 10 MG PO TABS
10.0000 mg | ORAL_TABLET | Freq: Three times a day (TID) | ORAL | Status: DC
  Administered 2022-08-24: 10 mg via ORAL
  Filled 2022-08-24 (×2): qty 1

## 2022-08-24 MED ORDER — FUROSEMIDE 10 MG/ML IJ SOLN
40.0000 mg | Freq: Every day | INTRAMUSCULAR | Status: DC
  Administered 2022-08-24: 40 mg via INTRAVENOUS
  Filled 2022-08-24: qty 4

## 2022-08-24 MED ORDER — SODIUM BICARBONATE 8.4 % IV SOLN
INTRAVENOUS | Status: AC
  Filled 2022-08-24: qty 50

## 2022-08-24 MED ORDER — METOPROLOL SUCCINATE ER 25 MG PO TB24
25.0000 mg | ORAL_TABLET | Freq: Every day | ORAL | Status: DC
  Administered 2022-08-24: 25 mg via ORAL
  Filled 2022-08-24: qty 1

## 2022-08-24 MED ORDER — TAMSULOSIN HCL 0.4 MG PO CAPS
0.4000 mg | ORAL_CAPSULE | Freq: Every day | ORAL | Status: DC
  Administered 2022-08-24: 0.4 mg via ORAL
  Filled 2022-08-24: qty 1

## 2022-08-24 MED ORDER — ATROPINE SULFATE 1 MG/10ML IJ SOSY: 1.0000 mg | PREFILLED_SYRINGE | Freq: Once | INTRAMUSCULAR | Status: AC

## 2022-08-24 MED ORDER — ONDANSETRON HCL 4 MG/2ML IJ SOLN: 4.0000 mg | Freq: Four times a day (QID) | INTRAMUSCULAR | Status: DC | PRN

## 2022-08-24 MED ORDER — ACETAMINOPHEN 325 MG PO TABS: 650.0000 mg | ORAL_TABLET | Freq: Two times a day (BID) | ORAL | Status: DC | PRN

## 2022-08-24 MED ORDER — ACETAMINOPHEN 650 MG RE SUPP: 650.0000 mg | Freq: Four times a day (QID) | RECTAL | Status: DC | PRN

## 2022-08-24 MED ORDER — VITAMIN C 500 MG PO TABS
500.0000 mg | ORAL_TABLET | Freq: Every day | ORAL | Status: DC
  Administered 2022-08-24: 500 mg via ORAL
  Filled 2022-08-24: qty 1

## 2022-08-24 MED ORDER — HALOPERIDOL LACTATE 5 MG/ML IJ SOLN: 0.5000 mg | INTRAMUSCULAR | Status: DC | PRN

## 2022-08-24 MED ORDER — OLANZAPINE 5 MG PO TABS
5.0000 mg | ORAL_TABLET | Freq: Two times a day (BID) | ORAL | Status: DC
  Administered 2022-08-24 (×2): 5 mg via ORAL
  Filled 2022-08-24 (×2): qty 1

## 2022-08-24 MED ORDER — HALOPERIDOL LACTATE 2 MG/ML PO CONC: 0.5000 mg | ORAL | Status: DC | PRN

## 2022-08-24 MED ORDER — HALOPERIDOL 1 MG PO TABS: 0.5000 mg | ORAL_TABLET | ORAL | Status: DC | PRN

## 2022-08-24 MED ORDER — SODIUM CHLORIDE 0.9 % IV SOLN
200.0000 mg | Freq: Once | INTRAVENOUS | Status: AC
  Administered 2022-08-24: 200 mg via INTRAVENOUS
  Filled 2022-08-24: qty 40

## 2022-08-24 MED ORDER — ZINC SULFATE 220 (50 ZN) MG PO CAPS
220.0000 mg | ORAL_CAPSULE | Freq: Every day | ORAL | Status: DC
  Administered 2022-08-24: 220 mg via ORAL
  Filled 2022-08-24: qty 1

## 2022-08-24 MED ORDER — POLYVINYL ALCOHOL 1.4 % OP SOLN: 1.0000 [drp] | Freq: Four times a day (QID) | OPHTHALMIC | Status: DC | PRN

## 2022-08-24 MED ORDER — GLYCOPYRROLATE 1 MG PO TABS: 1.0000 mg | ORAL_TABLET | ORAL | Status: DC | PRN

## 2022-08-24 MED ORDER — FUROSEMIDE 10 MG/ML IJ SOLN: 40.0000 mg | Freq: Two times a day (BID) | INTRAMUSCULAR | Status: DC

## 2022-08-24 MED ORDER — ATROPINE SULFATE 1 MG/10ML IJ SOSY
PREFILLED_SYRINGE | INTRAMUSCULAR | Status: AC
  Administered 2022-08-24: 1 mg via INTRAVENOUS
  Filled 2022-08-24: qty 10

## 2022-08-24 MED ORDER — PANTOPRAZOLE SODIUM 40 MG PO TBEC
40.0000 mg | DELAYED_RELEASE_TABLET | Freq: Every day | ORAL | Status: DC
  Administered 2022-08-24: 40 mg via ORAL
  Filled 2022-08-24: qty 1

## 2022-08-24 MED ORDER — ATORVASTATIN CALCIUM 10 MG PO TABS
20.0000 mg | ORAL_TABLET | Freq: Every day | ORAL | Status: DC
  Administered 2022-08-24: 20 mg via ORAL
  Filled 2022-08-24: qty 2

## 2022-08-24 MED ORDER — APIXABAN 5 MG PO TABS
5.0000 mg | ORAL_TABLET | Freq: Two times a day (BID) | ORAL | Status: DC
  Administered 2022-08-24 (×2): 5 mg via ORAL
  Filled 2022-08-24 (×2): qty 1

## 2022-08-25 ENCOUNTER — Encounter: Payer: Self-pay | Admitting: Family Medicine

## 2022-08-31 ENCOUNTER — Ambulatory Visit (HOSPITAL_BASED_OUTPATIENT_CLINIC_OR_DEPARTMENT_OTHER): Payer: Medicare Other | Admitting: Cardiology

## 2022-09-16 DIAGNOSIS — 419620001 Death: Secondary | SNOMED CT | POA: Insufficient documentation

## 2022-09-16 NOTE — Assessment & Plan Note (Addendum)
Controlled.  Last hemoglobin A1c of 5.7 9/23

## 2022-09-16 NOTE — Progress Notes (Signed)
Echocardiogram not complete patient is being moved from the emergency room to room upstairs. Will re-attempt as the schedule permits.   Darlina Sicilian [facility] Aug 31, 2022 13:58

## 2022-09-16 NOTE — Assessment & Plan Note (Signed)
-  Secondary to acute hypoxic resp failure, CHF exac and COVID with underlying dementia -soft restraints placed since pt was pulling off oxygen -continue to follow clinically with treatment

## 2022-09-16 NOTE — Assessment & Plan Note (Signed)
Continue home antihypertensives 

## 2022-09-16 NOTE — Assessment & Plan Note (Signed)
Continue statin. 

## 2022-09-16 NOTE — Accreditation Note (Signed)
Restraint death within 24 hours of removal of bilateral soft wrist restraints.  Logged on 08/25/2022 by Eddie Dibbles RNBSN at [phone].

## 2022-09-16 NOTE — Assessment & Plan Note (Signed)
-  EF of 25-30% with grade 3 diastolic dysfunction on echo 7/23 -CXR with cardiomegaly, pulmonary edema and BNP greater than 4500 -Continue daily IV 40 mg Lasix - Strict intake and output, daily weights -Obtain repeat echocardiogram

## 2022-09-16 NOTE — ED Notes (Signed)
Pt medicated with neb treatment per MAR. Pt then weaned off of nonrebreather. Pt on 10L [place] with bubbler. Oxygen saturation 99%.

## 2022-09-16 NOTE — ED Notes (Signed)
Dr. [place] consulted about pt's status change.

## 2022-09-16 NOTE — Assessment & Plan Note (Signed)
-   Continue Eliquis 

## 2022-09-16 NOTE — Progress Notes (Signed)
RT was called to PT's bed side due to dropping O2 stats. Pt O2 was at 90% on NRB @ 15L but would drop into the mid 80's. PT was using accessory muscles for respiration. Added salter 10L [place] brought O2 to 95%.

## 2022-09-16 NOTE — TOC Initial Note (Signed)
Transition of Care [facility]) - Initial/Assessment Note    Patient Details  Name: [name] MRN: [id number] Date of Birth: [birth year]  [facility] CM/SW Contact:    Leeroy Cha, RN Phone Number: 09-22-22, 1:29 PM  Clinical Narrative:                 Home health heart failure screening ordered through [employer].        Patient Goals and CMS Choice        Expected Discharge Plan and Services                                                Prior Living Arrangements/Services                       Activities of Daily Living Home Assistive Devices/Equipment: [name] (specify type) ADL Screening (condition at time of admission) Patient's cognitive ability adequate to safely complete daily activities?: No Is the patient deaf or have difficulty hearing?: No Does the patient have difficulty seeing, even when wearing glasses/contacts?: No Does the patient have difficulty concentrating, remembering, or making decisions?: Yes Patient able to express need for assistance with ADLs?: Yes Does the patient have difficulty dressing or bathing?: Yes Independently performs ADLs?: No Communication: Independent Dressing (OT): Needs assistance Is this a change from baseline?: Pre-admission baseline Grooming: Needs assistance Is this a change from baseline?: Pre-admission baseline Feeding: Independent Bathing: Needs assistance Is this a change from baseline?: Pre-admission baseline Toileting: Needs assistance Is this a change from baseline?: Pre-admission baseline In/Out Bed: Needs assistance Is this a change from baseline?: Pre-admission baseline Walks in Home: Needs assistance Is this a change from baseline?: Pre-admission baseline Does the patient have difficulty walking or climbing stairs?: Yes Weakness of Legs: Both Weakness of Arms/Hands: Both  Permission Sought/Granted                  Emotional Assessment               Admission diagnosis:  Acute on chronic congestive heart failure (HCC) [I50.9] Patient Active Problem List   Diagnosis Date Noted   Acute on chronic congestive heart failure ([place]) 08/21/2022   Acute renal failure superimposed on stage 3a chronic kidney disease ([place]) 08/19/2022   COVID-19 virus infection 09/11/2022   Acute hypoxic respiratory failure ([place]) 09/08/2022   Type 2 diabetes mellitus with diabetic chronic kidney disease ([place]) 07/22/2022   Dementia without behavioral disturbance ([place]) 07/14/2022   Pressure injury of skin 07/09/2022   CHF exacerbation ([place]) 07/07/2022   Pneumonia due to COVID-19 virus 07/07/2022   Xerosis of skin 06/07/2022   Hallucinations, unspecified 06/07/2022   Hyperthyroidism 54/27/0623   Acute metabolic encephalopathy 76/28/3151   Dementia with behavioral disturbance ([place]) 05/06/2022   Stage 3a chronic kidney disease (CKD) ([place]) 05/06/2022   Elevated troponin 05/06/2022   Elevated d-dimer 05/06/2022   Positive colorectal cancer screening using Cologuard test 11/02/2021   Chronic anticoagulation 11/02/2021   Cellulitis of right lower leg 10/07/2019   Chronic edema 05/17/2019   PAF (paroxysmal atrial fibrillation) ([place]) 04/10/2019   Chronic HFrEF (heart failure with reduced ejection fraction) ([place]) 04/08/2019   Altered mental status 04/07/2019   Psoriasis of scalp 08/15/2018   Anxiety, generalized 01/02/2017   NICM (nonischemic cardiomyopathy) ([place]) 09/09/2013   HLD (hyperlipidemia) 09/09/2013  OSA (obstructive sleep apnea) 81/77/1165   Acute systolic CHF (congestive heart failure), NYHA class 2 ([place]) 08/27/2013   LEG EDEMA 09/14/2009   Lymphoma, non-Hodgkin's ([place]) 09/24/2008   VERTIGO 09/24/2008   ABDOMINAL MASS 10/26/2007   Essential hypertension 10/23/2007   NEPHROLITHIASIS 10/23/2007   HEADACHE 06/07/2007   PCP:  [name], MD Pharmacy:   [id number] [id number]: [phone]: [phone]    Social Determinants of Health (SDOH) Interventions    Readmission Risk Interventions   Row Labels 07/11/2022   11:48 AM 05/23/2022   12:29 PM 05/12/2022    2:43 PM  Readmission Risk Prevention Plan   Section Header. No data exists in this row.     Transportation Screening   Complete Complete Complete  PCP or Specialist Appt within 3-5 Days    Complete   HRI or Home Care Consult    Complete Complete  Social Work Consult for [place]/Counseling    Complete Complete  Palliative Care Screening    Not Applicable Not Applicable  Medication Review (Press photographer   Complete Complete   PCP or Specialist appointment within 3-5 days of discharge   Complete    HRI or Home Care Consult   Complete    SW Recovery Care/Counseling Consult   Complete    Palliative Care Screening   Not [place]  Complete

## 2022-09-16 NOTE — Assessment & Plan Note (Signed)
Amiodarone induced hyperparathyroidism -TSH normal on presentation -continue methimazole

## 2022-09-16 NOTE — ED Notes (Signed)
Pt found to be at 76% on 4L [place]. Nurse called to bedside. Provider informed. Pt placed on nonrebreather at 15L pt spo2 increased to between 85% and 90%. Pt given lasix per provider. Respiratory called to bedside. Pt placed on [place] with bubbler at 8L with nonrebreather. Pt oxygen saturation returned to 97% with aforementioned interventions. Pt maintaining saturation between 95% and 100%.

## 2022-09-16 NOTE — Progress Notes (Signed)
   September 23, 2022 1800  Clinical Encounter Type  Visited With Patient and family together  Visit Type Death  Referral From Chaplain;Nurse  Consult/Referral To Chaplain  Recommendations Complicated Grief  Spiritual Encounters  Spiritual Needs Sacred text;Prayer;Ritual;Emotional;Grief support  Stress Factors  Patient Stress Factors None identified  Family Stress Factors Health changes;Loss;Loss of control;Major life changes   Met with patient who passed.  Met with wife and daughter in room while they are processing their Grief.  Will remain available throughout evening to assist family.  In their expression of Grief.

## 2022-09-16 NOTE — Progress Notes (Signed)
   Sep 11, 2022 1800  Clinical Encounter Type  Visited With Patient and family together  Visit Type Death  Referral From Chaplain;Nurse  Consult/Referral To Chaplain  Recommendations Complicated Grief  Spiritual Encounters  Spiritual Needs Sacred text;Prayer;Ritual;Emotional;Grief support  Stress Factors  Patient Stress Factors None identified  Family Stress Factors Health changes;Loss;Loss of control;Major life changes   Met with Patient at bedside who passed earlier.  Wife and Daughter and Son in [place] are present .  Requested prayer and anointing.  Requested [place] will call for local priest to visit.

## 2022-09-16 NOTE — Assessment & Plan Note (Addendum)
-  requiring O2 supplement which is multifactorial from infection and CHF exacerbation -start IV remdesivir

## 2022-09-16 NOTE — Assessment & Plan Note (Signed)
Secondary to acute on chronic CHF exacerbation and COVID-19 viral infection -treat with IV diuretic and antiviral -wean as tolerated with goal O2 >92%

## 2022-09-16 NOTE — Death Summary Note (Signed)
DEATH SUMMARY   Patient Details  Name: [name] MRN: [id number] DOB: 1945-11-26 PCP:Fry, Ishmael Holter, MD Admission/Discharge Information   Admit Date:  Sep 11, 2022  Date of Death: Date of Death: 2022-09-12  Time of Death: Time of Death: 01-16-1731  Length of Stay: 1   Principle Cause of death: Acute on chronic decompensated systolic and diastolic congestive heart failure, COVID-19 viral infection, acute respiratory failure with hypercarbia, acute metabolic encephalopathy  Hospital Diagnoses: Principal Problem:   Acute on chronic congestive heart failure (HCC) Active Problems:   Acute hypoxic respiratory failure (HCC)   Essential hypertension   HLD (hyperlipidemia)   PAF (paroxysmal atrial fibrillation) (HCC)   Acute metabolic encephalopathy   Hyperthyroidism   Type 2 diabetes mellitus with diabetic chronic kidney disease ([place])   Acute renal failure superimposed on stage 3a chronic kidney disease ([facility])   COVID-19 virus infection   Hospital Course:  [name] is a 76 year old male with past medical history significant for dementia, paroxysmal atrial fibrillation on Eliquis, chronic combined systolic/diastolic congestive heart failure, type 2 diabetes mellitus, essential hypertension, CKD stage IIIa, OSA who presents from home via EMS for shortness of breath, confusion and hallucinations per family.  Patient was just evaluated in the ED day prior to admission for cough, shortness of breath with oxygen desaturation of 86-88% on room air.  Treated initially with DuoNeb, IV Solu-Medrol and subsequently was found to be in CHF exacerbation with BNP greater than 4500 with chest x-ray findings of increased edema.  He diuresed well with IV Lasix and was able to come off oxygen and discharged home on increased dose of Lasix.  Since returning home, family reports he has become more short of breath with increased confusion.  Additionally, patient with sick contact, spouse also admitted  for pneumonia.  In the ED, temperature 96.6 F, HR 70, RR 16, BP 146/93, SPO2 99% on 3 L nasal cannula.  WBC 10.1, hemoglobin 13.0, platelets 287.  Sodium 142, potassium 4.0, chloride 104, CO2 29, glucose 132, BUN 26, creatinine 1.29.  High sensitive troponin 17.  BNP greater than 4500.  COVID-19 PCR positive.  Flu is A/B PCR negative.  Urinalysis unrevealing.  TSH 2.880, free T41.15.  Chest x-ray with bilateral infiltrates lower lungs favored to represent edema, though infection not excluded, cardiomegaly and pulmonary vascular congestion.  Was given IV Lasix 80 mg in the ED, DuoNeb.  Hospitalist consulted for admission.  Acute on chronic decompensated systolic and diastolic congestive heart failure COVID-19 viral infection Acute respiratory failure with hypercarbia Acute metabolic Encephalopathy Advanced dementia Patient representing to ED with shortness of breath, confusion.  Noted to be hypoxic with elevated BNP and chest x-ray findings consistent with pulmonary edema.  Also patient with COVID-19 viral infection with PCR positivity on admission.  Patient was afebrile without leukocytosis, unlikely bacterial pneumonia.  Patient was started on IV Lasix, remdesivir.  Patient unfortunately remained unresponsive during hospitalization and was moved to the stepdown unit.  VBG/ABG was ordered and patient was placed on BiPAP.  Patient's started become bradycardic and an ampule of atropine was administered with rapid response team at bedside.  Given his significant decline, called his daughter [name] and discussed with her concerning findings as likely he was further decompensating with impending demise.  Patient's daughter stated that she will be on her way to the hospital; and agreed not to further escalate care.  But prior to arrival, patient passed peacefully on 2022-09-12 at 1732.  Patient's daughter was updated at  bedside as well as patient's spouse and condolences offered.  Chaplain services offered as  well.   Procedures: None  Consultations: None  The results of significant diagnostics from this hospitalization (including imaging, microbiology, ancillary and laboratory) are listed below for reference.   Significant Diagnostic Studies: DG Chest Port 1 View  Result Date: 08/28/2022 CLINICAL DATA:  Shortness of breath EXAM: PORTABLE CHEST 1 VIEW COMPARISON:  Radiographs 09/07/2022 FINDINGS: No significant change from 08/21/2022. Cardiomegaly. Pulmonary vascular congestion. Aortic atherosclerotic calcification. Bibasilar airspace and interstitial opacities probable small right pleural effusion. No pneumothorax. No acute osseous abnormality. IMPRESSION: Bilateral infiltrates in the lower lungs favored to represent edema though infection is not excluded. Cardiomegaly and pulmonary vascular congestion. Electronically Signed   By: Placido Sou M.D.   On: 08/20/2022 19:35   DG Chest 2 View  Result Date: 08/29/2022 CLINICAL DATA:  Shortness of breath EXAM: CHEST - 2 VIEW COMPARISON:  Radiographs 07/07/2022 FINDINGS: Stable cardiomegaly. Pulmonary vascular congestion. Aortic atherosclerotic calcification. Right-greater-than-left hazy airspace and interstitial opacities. Small left-greater-than-right pleural effusions. No pneumothorax. No acute osseous abnormality. IMPRESSION: Findings suggestive of congestive heart failure. Electronically Signed   By: Placido Sou M.D.   On: 08/19/2022 03:40    Microbiology: Recent Results (from the past 240 hour(s))  Resp Panel by RT-PCR (Flu A&B, Covid) Anterior Nasal Swab     Status: Abnormal   Collection Time: 08/21/2022  6:46 PM   Specimen: Anterior Nasal Swab  Result Value Ref Range Status   SARS Coronavirus 2 by RT PCR POSITIVE (A) NEGATIVE Final    Comment: (NOTE) SARS-CoV-2 target nucleic acids are DETECTED.  The SARS-CoV-2 RNA is generally detectable in upper respiratory specimens during the acute phase of infection. Positive results  are indicative of the presence of the identified virus, but do not rule out bacterial infection or co-infection with other pathogens not detected by the test. Clinical correlation with patient history and other diagnostic information is necessary to determine patient infection status. The expected result is Negative.  Fact Sheet for Patients: EntrepreneurPulse.com.au  Fact Sheet for Healthcare Providers: IncredibleEmployment.be  This test is not yet approved or cleared by the [place] FDA and  has been authorized for detection and/or diagnosis of SARS-CoV-2 by FDA under an Emergency Use Authorization (EUA).  This EUA will remain in effect (meaning this test can be used) for the duration of  the COVID-19 declaration under Section 564(b)(1) of the A ct, 21 U.S.C. section 360bbb-3(b)(1), unless the authorization is terminated or revoked sooner.     Influenza A by PCR NEGATIVE NEGATIVE Final   Influenza B by PCR NEGATIVE NEGATIVE Final    Comment: (NOTE) The Xpert Xpress SARS-CoV-2/FLU/RSV plus assay is intended as an aid in the diagnosis of influenza from Nasopharyngeal swab specimens and should not be used as a sole basis for treatment. Nasal washings and aspirates are unacceptable for Xpert Xpress SARS-CoV-2/FLU/RSV testing.  Fact Sheet for Patients: EntrepreneurPulse.com.au  Fact Sheet for Healthcare Providers: IncredibleEmployment.be  This test is not yet approved or cleared by the [place] FDA and has been authorized for detection and/or diagnosis of SARS-CoV-2 by FDA under an Emergency Use Authorization (EUA). This EUA will remain in effect (meaning this test can be used) for the duration of the COVID-19 declaration under Section 564(b)(1) of the Act, 21 U.S.C. section 360bbb-3(b)(1), unless the authorization is terminated or revoked.  Performed at [facility], [place]  [address], [place], [place] [address]     Time spent:  41 minutes  Signed: Maxi Carreras J [place] DO Sep 02, 2022

## 2022-09-16 DEATH — deceased

## 2022-09-22 ENCOUNTER — Ambulatory Visit: Payer: Medicare Other | Admitting: Internal Medicine
